# Patient Record
Sex: Male | Born: 1950 | Hispanic: Yes | Marital: Married | Smoking: Never smoker
Health system: Southern US, Community
[De-identification: ages and names within clinical notes are randomized; demographics above are authoritative.]

## PROBLEM LIST (undated history)

## (undated) DIAGNOSIS — R29898 Other symptoms and signs involving the musculoskeletal system: Secondary | ICD-10-CM

## (undated) DIAGNOSIS — Z8739 Personal history of other diseases of the musculoskeletal system and connective tissue: Secondary | ICD-10-CM

## (undated) DIAGNOSIS — Z8719 Personal history of other diseases of the digestive system: Secondary | ICD-10-CM

## (undated) DIAGNOSIS — R42 Dizziness and giddiness: Secondary | ICD-10-CM

## (undated) DIAGNOSIS — M2652 Limited mandibular range of motion: Secondary | ICD-10-CM

## (undated) DIAGNOSIS — Z860100 Personal history of colon polyps, unspecified: Secondary | ICD-10-CM

## (undated) DIAGNOSIS — E039 Hypothyroidism, unspecified: Secondary | ICD-10-CM

## (undated) DIAGNOSIS — F329 Major depressive disorder, single episode, unspecified: Secondary | ICD-10-CM

## (undated) DIAGNOSIS — R001 Bradycardia, unspecified: Secondary | ICD-10-CM

## (undated) DIAGNOSIS — M1990 Unspecified osteoarthritis, unspecified site: Secondary | ICD-10-CM

## (undated) DIAGNOSIS — Z955 Presence of coronary angioplasty implant and graft: Secondary | ICD-10-CM

## (undated) DIAGNOSIS — Z8589 Personal history of malignant neoplasm of other organs and systems: Secondary | ICD-10-CM

## (undated) DIAGNOSIS — Z9889 Other specified postprocedural states: Secondary | ICD-10-CM

## (undated) DIAGNOSIS — E119 Type 2 diabetes mellitus without complications: Secondary | ICD-10-CM

## (undated) DIAGNOSIS — N401 Enlarged prostate with lower urinary tract symptoms: Secondary | ICD-10-CM

## (undated) DIAGNOSIS — M2560 Stiffness of unspecified joint, not elsewhere classified: Secondary | ICD-10-CM

## (undated) DIAGNOSIS — E785 Hyperlipidemia, unspecified: Secondary | ICD-10-CM

## (undated) DIAGNOSIS — I10 Essential (primary) hypertension: Secondary | ICD-10-CM

## (undated) DIAGNOSIS — I2510 Atherosclerotic heart disease of native coronary artery without angina pectoris: Secondary | ICD-10-CM

## (undated) DIAGNOSIS — R112 Nausea with vomiting, unspecified: Secondary | ICD-10-CM

## (undated) DIAGNOSIS — K219 Gastro-esophageal reflux disease without esophagitis: Secondary | ICD-10-CM

## (undated) DIAGNOSIS — L409 Psoriasis, unspecified: Secondary | ICD-10-CM

## (undated) DIAGNOSIS — I219 Acute myocardial infarction, unspecified: Secondary | ICD-10-CM

## (undated) DIAGNOSIS — C801 Malignant (primary) neoplasm, unspecified: Secondary | ICD-10-CM

## (undated) DIAGNOSIS — K635 Polyp of colon: Secondary | ICD-10-CM

## (undated) DIAGNOSIS — Z923 Personal history of irradiation: Secondary | ICD-10-CM

## (undated) DIAGNOSIS — N400 Enlarged prostate without lower urinary tract symptoms: Secondary | ICD-10-CM

## (undated) DIAGNOSIS — K5790 Diverticulosis of intestine, part unspecified, without perforation or abscess without bleeding: Secondary | ICD-10-CM

## (undated) DIAGNOSIS — E782 Mixed hyperlipidemia: Secondary | ICD-10-CM

## (undated) DIAGNOSIS — N183 Chronic kidney disease, stage 3 unspecified: Secondary | ICD-10-CM

## (undated) DIAGNOSIS — F419 Anxiety disorder, unspecified: Secondary | ICD-10-CM

## (undated) DIAGNOSIS — Z794 Long term (current) use of insulin: Secondary | ICD-10-CM

## (undated) DIAGNOSIS — F32A Depression, unspecified: Secondary | ICD-10-CM

## (undated) DIAGNOSIS — M65311 Trigger thumb, right thumb: Secondary | ICD-10-CM

## (undated) HISTORY — DX: Essential (primary) hypertension: I10

## (undated) HISTORY — PX: CORONARY ANGIOPLASTY WITH STENT PLACEMENT: SHX49

## (undated) HISTORY — PX: ESOPHAGOGASTRODUODENOSCOPY: SHX1529

## (undated) HISTORY — DX: Other specified postprocedural states: Z98.890

## (undated) HISTORY — DX: Personal history of other diseases of the digestive system: Z87.19

## (undated) HISTORY — DX: Hyperlipidemia, unspecified: E78.5

## (undated) HISTORY — DX: Personal history of irradiation: Z92.3

## (undated) HISTORY — DX: Major depressive disorder, single episode, unspecified: F32.9

## (undated) HISTORY — PX: CORONARY ARTERY BYPASS GRAFT: SHX141

## (undated) HISTORY — PX: COLONOSCOPY WITH PROPOFOL: SHX5780

## (undated) HISTORY — DX: Malignant (primary) neoplasm, unspecified: C80.1

## (undated) HISTORY — PX: CORONARY ANGIOPLASTY: SHX604

## (undated) HISTORY — DX: Gastro-esophageal reflux disease without esophagitis: K21.9

## (undated) HISTORY — PX: CARDIAC CATHETERIZATION: SHX172

## (undated) HISTORY — DX: Depression, unspecified: F32.A

## (undated) HISTORY — DX: Diverticulosis of intestine, part unspecified, without perforation or abscess without bleeding: K57.90

## (undated) HISTORY — DX: Polyp of colon: K63.5

## (undated) HISTORY — DX: Anxiety disorder, unspecified: F41.9

---

## 2001-01-23 DIAGNOSIS — I252 Old myocardial infarction: Secondary | ICD-10-CM

## 2001-01-23 DIAGNOSIS — I219 Acute myocardial infarction, unspecified: Secondary | ICD-10-CM

## 2001-01-23 HISTORY — DX: Old myocardial infarction: I25.2

## 2001-01-23 HISTORY — DX: Acute myocardial infarction, unspecified: I21.9

## 2001-01-23 HISTORY — PX: CORONARY ANGIOPLASTY WITH STENT PLACEMENT: SHX49

## 2002-01-23 DIAGNOSIS — Z951 Presence of aortocoronary bypass graft: Secondary | ICD-10-CM

## 2002-01-23 HISTORY — DX: Presence of aortocoronary bypass graft: Z95.1

## 2003-01-24 HISTORY — PX: CORONARY ARTERY BYPASS GRAFT: SHX141

## 2009-05-27 ENCOUNTER — Ambulatory Visit: Payer: Self-pay | Admitting: Family Medicine

## 2009-07-09 ENCOUNTER — Encounter: Payer: Self-pay | Admitting: Internal Medicine

## 2009-07-23 HISTORY — PX: CARDIOVASCULAR STRESS TEST: SHX262

## 2009-07-28 ENCOUNTER — Ambulatory Visit: Payer: Self-pay | Admitting: Family Medicine

## 2009-08-02 ENCOUNTER — Ambulatory Visit: Payer: Self-pay | Admitting: Internal Medicine

## 2009-08-02 DIAGNOSIS — E782 Mixed hyperlipidemia: Secondary | ICD-10-CM

## 2009-08-02 DIAGNOSIS — I10 Essential (primary) hypertension: Secondary | ICD-10-CM | POA: Insufficient documentation

## 2009-08-02 DIAGNOSIS — E1039 Type 1 diabetes mellitus with other diabetic ophthalmic complication: Secondary | ICD-10-CM

## 2009-08-02 DIAGNOSIS — E1065 Type 1 diabetes mellitus with hyperglycemia: Secondary | ICD-10-CM

## 2009-08-02 DIAGNOSIS — I2510 Atherosclerotic heart disease of native coronary artery without angina pectoris: Secondary | ICD-10-CM | POA: Insufficient documentation

## 2009-08-11 ENCOUNTER — Telehealth (INDEPENDENT_AMBULATORY_CARE_PROVIDER_SITE_OTHER): Payer: Self-pay

## 2009-08-12 ENCOUNTER — Encounter (HOSPITAL_COMMUNITY): Admission: RE | Admit: 2009-08-12 | Discharge: 2009-10-14 | Payer: Self-pay | Admitting: Internal Medicine

## 2009-08-12 ENCOUNTER — Ambulatory Visit: Payer: Self-pay | Admitting: Cardiology

## 2009-08-12 ENCOUNTER — Ambulatory Visit: Payer: Self-pay

## 2009-08-12 ENCOUNTER — Encounter: Payer: Self-pay | Admitting: Internal Medicine

## 2009-08-12 ENCOUNTER — Encounter: Payer: Self-pay | Admitting: Cardiology

## 2009-08-12 ENCOUNTER — Ambulatory Visit (HOSPITAL_COMMUNITY): Admission: RE | Admit: 2009-08-12 | Discharge: 2009-08-12 | Payer: Self-pay | Admitting: Internal Medicine

## 2009-08-16 ENCOUNTER — Telehealth: Payer: Self-pay | Admitting: Internal Medicine

## 2009-08-16 ENCOUNTER — Encounter: Payer: Self-pay | Admitting: Cardiology

## 2009-08-19 ENCOUNTER — Encounter: Payer: Self-pay | Admitting: Internal Medicine

## 2009-08-27 ENCOUNTER — Ambulatory Visit: Payer: Self-pay | Admitting: Family Medicine

## 2009-09-03 ENCOUNTER — Telehealth (INDEPENDENT_AMBULATORY_CARE_PROVIDER_SITE_OTHER): Payer: Self-pay | Admitting: *Deleted

## 2009-09-15 ENCOUNTER — Encounter: Admission: RE | Admit: 2009-09-15 | Discharge: 2009-09-15 | Payer: Self-pay | Admitting: Podiatry

## 2009-09-17 ENCOUNTER — Ambulatory Visit: Payer: Self-pay | Admitting: Internal Medicine

## 2009-10-13 ENCOUNTER — Ambulatory Visit (HOSPITAL_BASED_OUTPATIENT_CLINIC_OR_DEPARTMENT_OTHER): Admission: RE | Admit: 2009-10-13 | Discharge: 2009-10-13 | Payer: Self-pay | Admitting: Podiatry

## 2009-10-13 HISTORY — PX: FOOT ARTHRODESIS, SUBTALAR: SUR53

## 2009-10-18 ENCOUNTER — Ambulatory Visit: Payer: Self-pay | Admitting: Family Medicine

## 2010-01-28 ENCOUNTER — Encounter: Payer: Self-pay | Admitting: Internal Medicine

## 2010-01-28 ENCOUNTER — Ambulatory Visit
Admission: RE | Admit: 2010-01-28 | Discharge: 2010-01-28 | Payer: Self-pay | Source: Home / Self Care | Attending: Internal Medicine | Admitting: Internal Medicine

## 2010-02-02 ENCOUNTER — Encounter: Payer: Self-pay | Admitting: Internal Medicine

## 2010-02-02 ENCOUNTER — Ambulatory Visit
Admission: RE | Admit: 2010-02-02 | Discharge: 2010-02-02 | Payer: Self-pay | Source: Home / Self Care | Attending: Family Medicine | Admitting: Family Medicine

## 2010-02-13 ENCOUNTER — Encounter: Payer: Self-pay | Admitting: Podiatry

## 2010-02-22 NOTE — Assessment & Plan Note (Signed)
Summary: np6/sur clearance/foot surgery/jss   Visit Type:  Pre-op Evaluation np6 Primary Provider:  dr Standley Dakins  CC:  sharp pain in left arm  and sweats.  History of Present Illness: Patient is a 60 year old who is being evaluated for foot surgery (requiring general anesthesia).  Referred for cardiac risk stratification. [place] in March of this year.  Few records.  Had PTCA/stent with stent to LAD in 2003.   Stent developed renarrowing in 2004 He also had CABG (LIMA to LAD; SVG to OM) in 2004.  Cath in  September 2006 showed LIMA to LAD was atrophic; SVG to Cx was occluded.  Repeat cath in October 2005 patieht had PTCA/DES  to OM, and PTCA/DES to LAD. He was admitted to hospital in [place] in May of 2010 for chest pain.  A myoview scan was reprotedly done that was normal.   Since seen he denies significant chest pians.  Breathing is OK.  He walks some (limited by R foot problems)  Surgery for foot is being planned. The patient denies csht pain.  Does say he has gotten some night sweats (like he had  before his intervention in 2003.  Current Medications (verified): 1)  Metformin Hcl 500 Mg Tabs (Metformin Hcl) .[name].. 1 Tab Once Daily 2)  Aspirin Ec 325 Mg Tbec (Aspirin) .... Take One Tablet By Mouth Daily 3)  Simvastatin 20 Mg Tabs (Simvastatin) .[name].. 1 Tab Once Daily 4)  Enalapril Maleate 10 Mg Tabs (Enalapril Maleate) .... Take One Tablet By Mouth Once Daily 5)  Ranitidine Hcl 300 Mg Caps (Ranitidine Hcl) .[name].. 1 Tab Two Times A Day 6)  Neurontin 600 Mg Tabs (Gabapentin) .[name].. 1 Tab Once Daily 7)  Dipyridamole 75 Mg Tabs (Dipyridamole) .[name].. 1 Tab Once Daily  Allergies (verified): No Known Drug Allergies  Past History:  Past Medical History: CAD Dyslipidemia Diabetes Reflux.  Past Surgical History: CABG 2005 (LIMA to LAD; SVG to OM)  Family History: Father died age 4 unknown cause. Mother died age 73. Sister died of CVA  Social  History: No tobacco No EtOH.  Married 2 kds.  Review of Systems       All systems reviewed.  Negatvie to the above problem except as noted above.  Vital Signs:  Patient profile:   60 year old male Height:      68 inches Weight:      192 pounds BMI:     29.30 Pulse rate:   64 / minute BP sitting:   138 / 85  (left arm) Cuff size:   regular  Vitals Entered By: Burnett Kanaris, CNA (August 02, 2009 11:59 AM)  Physical Exam  Additional Exam:  patient is in NAD HEENT:  Normocephalic, atraumatic. EOMI, PERRLA.  Neck: JVP is normal. No thyromegaly. No bruits.  Lungs: clear to auscultation. No rales no wheezes.  Heart: Regular rate and rhythm. Normal S1, S2. No S3.   No significant murmurs. PMI not displaced.  Abdomen:  Supple, nontender. Normal bowel sounds. No masses. No hepatomegaly.  Extremities:   Good distal pulses throughout. No lower extremity edema.  Musculoskeletal :moving all extremities.   R anke with bruising, swelling. Neuro:   alert and oriented x3.    EKG  Procedure date:  08/02/2009  Findings:      NSR.  64 bpm.  Possible IWMI.  Anterior MI.  Impression & Recommendations:  Problem # 1:  CAD (ICD-414.00) Patient with extensive history.  Concerning  to me is symptoms of night sweats like he had before his surgery.    I would recommend adenosine myoview   and and echo.  Continue meds.  He can stop dipyridamole.  Problem # 2:  HYPERLIPIDEMIA-MIXED (ICD-272.4) Lipids are frollowed by Dr. Laural Benes.  Reproted that recently increased to 40 Zocor.  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  Other Orders: EKG w/ Interpretation (93000) Echocardiogram (Echo) Nuclear Stress Test (Nuc Stress Test)  Patient Instructions: 1)  Your physician has requested that you have an echocardiogram.  Echocardiography is a painless test that uses sound waves to create images of your heart. It provides  your doctor with information about the size and shape of your heart and how well your heart's chambers and valves are working.  This procedure takes approximately one hour. There are no restrictions for this procedure. we will call you with results 2)  Your physician has requested that you have an adenosine myoview.  For further information please visit [email or web address]  Please follow instruction sheet, as given. 3)  we will call you with results 4)  Your physician has recommended you make the following change in your medication: STOP Persantine

## 2010-02-22 NOTE — Medication Information (Signed)
Summary: Non Pharmacy Prescription Form   Non Pharmacy Prescription Form   Imported By: Roderic Ovens 10/07/2009 13:06:00  _____________________________________________________________________  External Attachment:    Type:   Image     Comment:   External Document

## 2010-02-22 NOTE — Progress Notes (Signed)
Summary: test results  Phone Note Call from Patient Call back at Home Phone [phone]   Caller: Patient Summary of Call: test result Initial call taken by: Judie Grieve,  August 16, 2009 2:05 PM  Follow-up for Phone Call        Sent flag note to Dr.Zyair Rhein to call patient back with a plan. Follow-up by: Suzan Garibaldi RN  Additional Follow-up for Phone Call Additional follow up Details #1::        See append on nuc scan.  Low risk. Additional Follow-up by: Sherrill Raring, MD, [facility],  August 18, 2009 5:02 PM     Appended Document: test results Patient aware of echo and nuc stress test results.

## 2010-02-22 NOTE — Progress Notes (Signed)
Summary: Records faxed  Faxed surgical clearance and test results to [facility]-at [phone], [place].Rene Kocher Flowers  September 03, 2009 3:53 PM

## 2010-02-22 NOTE — Assessment & Plan Note (Signed)
Summary: started on Toprol 25mg  on 7/28/jr   Visit Type:  Follow-up Primary Bentley Haralson:  dr Standley Dakins  CC:  pt  continues tohave pain left arm .  History of Present Illness: Patient is a 60 year old who is being evaluated for foot surgery (requiring general anesthesia).  Referred for cardiac risk stratification. [place] in March of this year.  Few records.  Had PTCA/stent with stent to LAD in 2003.   Stent developed renarrowing in 2004 He also had CABG (LIMA to LAD; SVG to OM) in 2004.  Cath in  September 2006 showed LIMA to LAD was atrophic; SVG to Cx was occluded.  Repeat cath in October 2005 patieht had PTCA/DES  to OM, and PTCA/DES to LAD. He was admitted to hospital in [place] in May of 2010 for chest pain.  A myoview scan was reprotedly done that was normal.  I saw him earlier this summer.  He had an echo and a myoview.  Echo showed mild LV dysfunction with anterseptal and apical hypokinesis.  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 Since seen he denies chest pains.  He does have intermitt L arm pain that he has had for years.  Occurs at rest.  Lambert Mody, stabbing.  Occurs in L antecubital region.  No radiation.  Lasts about a min.  Takes breath away.  Note that he did nave this when he had his last noninvasive evaluation in [place] He notes that the toprol makes him sleepy.  wants to stop.  No dizziness.  Also wants to decrease ASA to 81 mg.   Current Medications (verified): 1)  Metformin Hcl 500 Mg Tabs (Metformin Hcl) .... 2 Tabs Bid 2)  Aspirin Ec 325 Mg Tbec (Aspirin) .... Take One Tablet By Mouth Daily 3)  Enalapril Maleate 10 Mg Tabs (Enalapril Maleate) .... Take One Tablet By Mouth Once Daily 4)  Neurontin 600 Mg Tabs (Gabapentin) .[name].. 1 Tab Once Daily 5)  Simvastatin 40 Mg Tabs (Simvastatin) .[name].. 1 Tab Every Day 6)  Metoprolol Succinate 25 Mg Xr24h-Tab (Metoprolol Succinate) .[name].. 1 Tab Every Day 7)   Prilosec 40 Mg Cpdr (Omeprazole) .[name].. 1tab Once Daily  Allergies (verified): No Known Drug Allergies  Past History:  Past medical, surgical, family and social histories (including risk factors) reviewed, and no changes noted (except as noted below).  Past Medical History: Reviewed history from 08/02/2009 and no changes required. CAD Dyslipidemia Diabetes Reflux.  Past Surgical History: Reviewed history from 08/02/2009 and no changes required. CABG 2005 (LIMA to LAD; SVG to OM)  Family History: Reviewed history from 08/02/2009 and no changes required. Father died age 61 unknown cause. Mother died age 38. Sister died of CVA  Social History: Reviewed history from 08/02/2009 and no changes required. No tobacco No EtOH.  Married 2 kds.  Vital Signs:  Patient profile:   60 year old male Height:      68 inches Weight:      188 pounds BMI:     28.69 Pulse rate:   57 / minute BP sitting:   128 / 78  (left arm)  Vitals Entered By: Burnett Kanaris, CNA (September 17, 2009 9:11 AM)  Physical Exam  Additional Exam:  Patient is in NAD HEENT:  Normocephalic, atraumatic. EOMI, PERRLA.  Neck: JVP is normal. No thyromegaly. No bruits.  Lungs: clear to auscultation. No rales no wheezes.  Heart: Regular rate and rhythm. Normal S1, S2. No S3.  No significant murmurs. PMI not displaced.  Abdomen:  Supple, nontender. Normal bowel sounds. No masses. No hepatomegaly.  Extremities:   Good distal pulses throughout. No lower extremity edema.  Musculoskeletal :moving all extremities.  Neuro:   alert and oriented x3.    EKG  Procedure date:  09/17/2009  Findings:      Sinus bradycardia.  57 bpm. Anteroseptal MI.  Impression & Recommendations:  Problem # 1:  CAD (ICD-414.00) Patient with CAD as noted.  He is not having CP.  Has atypical arm pain that I do not think is cardiac.   Recent noninvasive testing shows no ischemia.  It does show evidence of scar that was not reported by him  on the previous stress test in [place] ( I do not have these records).  This is in the distribution of the LAD which has had mult interventions as noted.  With no ischemia I do not think repeat cath is indicated without symptoms.  He is not that active because of his foot but does get around with pain, significant SOB.  Anyway, repeat intervention in this area would had increased chance of not succeeding. Overall, I think is is at low risk for major cardiac event with planned surgery.  OK to proceed. OK to decrease ASA to 81 mg.  OK to D/C toprol.  Problem # 2:  HYPERTENSION, BENIGN (ICD-401.1) Follow.  Problem # 3:  HYPERLIPIDEMIA-MIXED (ICD-272.4) Continue meds. His updated medication list for this problem includes:    Simvastatin 40 Mg Tabs (Simvastatin) .[name]... 1 tab every day  Other Orders: EKG w/ Interpretation (93000)  Patient Instructions: 1)  Your physician wants you to follow-up in:  will let you know when to see Dr.Ross again.  You will receive a reminder letter in the mail two months in advance. If you don't receive a letter, please call our office to schedule the follow-up appointment.

## 2010-02-22 NOTE — Progress Notes (Signed)
Summary: Nuc. Pre-Procedure  Phone Note Outgoing Call Call back at [facility] Phone [phone]   Call placed by: Irean Hong, RN,  August 11, 2009 2:42 PM Summary of Call: Reviewed information on Myoview Information Sheet (see scanned document for further details).  Spoke with patient.     Nuclear Med Background Indications for Stress Test: Evaluation for Ischemia, Surgical Clearance, Graft Patency, Stent Patency, PTCA Patency  Indications Comments: Pending (R) Foot surgery  History: Abnormal EKG, Angioplasty, CABG, Heart Catheterization, Myocardial Perfusion Study, Stents  History Comments: '03 Stent LAD. '04 CABGx2 '06 Cath> PTCA/Stent: LAD,OM. 5/10 MPS: NL([place]).     Nuclear Pre-Procedure Cardiac Risk Factors: Lipids, NIDDM Height (in): 68

## 2010-02-22 NOTE — Miscellaneous (Signed)
  Clinical Lists Changes  Medications: Added new medication of METOPROLOL SUCCINATE 25 MG XR24H-TAB (METOPROLOL SUCCINATE) 1 tab every day - Signed Rx of METOPROLOL SUCCINATE 25 MG XR24H-TAB (METOPROLOL SUCCINATE) 1 tab every day;  #30 x 6;  Signed;  Entered by: Layne Benton, RN, BSN;  Authorized by: Sherrill Raring, MD, [facility];  Method used: Electronically to [facility]. [place]. [phone]*, [address] [place], [place], [place]  [address], Ph: [phone] or [phone], Fax: [phone]    Prescriptions: METOPROLOL SUCCINATE 25 MG XR24H-TAB (METOPROLOL SUCCINATE) 1 tab every day  #30 x 6   Entered by:   Layne Benton, RN, BSN   Authorized by:   Sherrill Raring, MD, [facility]   Signed by:   Layne Benton, RN, BSN on 08/19/2009   Method used:   Electronically to        [facility]. [address] [phone]* (retail)       [address]       [place], [place]  [address]       Ph: [phone] or [phone]       Fax: [phone]   RxID:   [id number]

## 2010-02-22 NOTE — Letter (Signed)
Summary: [facility] - Referral  [facility] - Referral   Imported By: Marylou Mccoy 07/27/2009 15:46:57  _____________________________________________________________________  External Attachment:    Type:   Image     Comment:   External Document

## 2010-02-22 NOTE — Assessment & Plan Note (Signed)
Summary: Cardiology Nuclear Study  Nuclear Med Background Indications for Stress Test: Evaluation for Ischemia, Surgical Clearance, Graft Patency, Stent Patency, PTCA Patency  Indications Comments: Pending (R) [facility]  History: Abnormal EKG, Angioplasty, CABG, Heart Catheterization, Myocardial Perfusion Study, Stents  History Comments: '03 Stent-LAD; '04 CABG x 2; '06 PTCA/Stent-LAD,OM; 5/10 [id number]:[id number]  Symptoms: Chest Pressure, Chest Pressure with Exertion, Diaphoresis, Dizziness, DOE, Fatigue, Nausea, Near Syncope  Symptoms Comments: Last episode of BJ:YNWGNFAO 2010.   Nuclear Pre-Procedure Cardiac Risk Factors: Hypertension, Lipids, NIDDM Caffeine/Decaff Intake: None NPO After: 10:00 PM Lungs: Clear.  O2 Sat 96% on RA. IV 0.9% NS with Angio Cath: 20g     IV Site: (R) AC IV Started by: Irean Hong RN Chest Size (in) 40     Height (in): 68 Weight (lb): 189 BMI: 28.84 Tech Comments: No medications taken today per patient.  Nuclear Med Study 1 or 2 day study:  1 day     Stress Test Type:  Eugenie Birks Reading MD:  Marca Ancona, MD     Referring MD:  Dietrich Pates, MD Resting Radionuclide:  Technetium 37m Tetrofosmin     Resting Radionuclide Dose:  11.0 mCi  Stress Radionuclide:  Technetium 52m Tetrofosmin     Stress Radionuclide Dose:  33.0 mCi   Stress Protocol   Lexiscan: 0.4 mg   Stress Test Technologist:  Rea College CMA-N     Nuclear Technologist:  Harlow Asa CNMT  Rest Procedure  Myocardial perfusion imaging was performed at rest 45 minutes following the intravenous administration of Myoview Technetium 33m Tetrofosmin.  Stress Procedure  The patient received IV Lexiscan 0.4 mg over 15-seconds.  Myoview injected at 30-seconds.  There were no significant changes with infusion.  He did c/o chest pressure.  Quantitative spect images were obtained after a 45 minute delay.  QPS Raw Data Images:  Normal; no motion artifact; normal heart/lung  ratio. Stress Images:  Basal to mid anteroseptal perfusion defect.  Rest Images:  Basal to mid anteroseptal perfusion defect.  Subtraction (SDS):  Primarily fixed basal to mid anteroseptal perfusion defect with mild reversibility.  Transient Ischemic Dilatation:  .94  (Normal <1.22)  Lung/Heart Ratio:  .29  (Normal <0.45)  Quantitative Gated Spect Images QGS EDV:  93 ml QGS ESV:  50 ml QGS EF:  46 % QGS cine images:  Anteroseptal and apical hypokinesis.    Overall Impression  Exercise Capacity: Lexiscan study BP Response: Normal blood pressure response. Clinical Symptoms: nausea, chest pressure.  ECG Impression: No significant ST segment change suggestive of ischemia. Overall Impression: Primarily fixed basal to mid anteroseptal perfusion defect suggestive of infarction with some peri-infarct ischemia.  Overall Impression Comments: Anteroseptal wall motion defect with mild LV systolic dysfunction.   Appended Document: Cardiology Nuclear Study Myoview is not normal.  Evidence of previuos heart attack with mild perinfarct ischemia. Attempted to call patient to review.  Not at home.  Appended Document: Cardiology Nuclear Study Reviewed nuclear scan.  Overall, low risk scan with previous infarct. If patient is not having chest pain active  he is a low risk to proceed with upcoming foot surgery With history and mildly depressed LVEF I would recomm low dose b blocker in addition to his other meds. (Toprol XL 25 mg). He will need f/u in late summer. Left msg on machine to call back or we would call back.  Appended Document: Cardiology Nuclear Study Patient aware of above. Toprol sent to [facility]. Made ROV for 8/26 with Dr.Ross.

## 2010-02-24 NOTE — Assessment & Plan Note (Signed)
Summary: 6 MONTH ROV/SL   Visit Type:  6 mo f/u Primary Provider:  Standley Dakins  CC:  chest pain last night, sob, and pt also states yesterday while he was at physical therpay for his foot he had some cp and was diaphortic while he was on the exercise bike...edema/left foot.....  History of Present Illness: Patient is a 60 year old who is being evaluated for foot surgery (requiring general anesthesia).  Referred for cardiac risk stratification. [place] in March of this year.  Few records.  Had PTCA/stent with stent to LAD in 2003.   Stent developed renarrowing in 2004 He also had CABG (LIMA to LAD; SVG to OM) in 2004.  Cath in  September 2006 showed LIMA to LAD was atrophic; SVG to Cx was occluded.  Repeat cath in October 2005 patieht had PTCA/DES  to OM, and PTCA/DES to LAD. He was admitted to hospital in [place] in May of 2010 for chest pain.  A myoview scan was reprotedly done that was normal.  I saw him earlier this summer.  He had an echo and a myoview.  Echo showed mild LV dysfunction with anterseptal and apical hypokinesis.  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 Since I saw him in August  he underwent foot surgery and toloerated it well.  He has now started rehab. The other day he was on the bike at rehab.  When he got off he developed chest tightness.  He had not had that in a long time.  It eased off on own.  Concerned him.   The other night while watching TV also developed L sided chest pain.  Again concerning, like previous angina.  Eased off on own.  Has not had any since.  Current Medications (verified): 1)  Metformin Hcl 500 Mg Tabs (Metformin Hcl) .[name].. 1 Tab Two Times A Day 2)  Aspirin Ec 325 Mg Tbec (Aspirin) .... Take One Tablet By Mouth Daily 3)  Enalapril Maleate 10 Mg Tabs (Enalapril Maleate) .... Take One Tablet By Mouth Once Daily 4)  Neurontin 600 Mg Tabs (Gabapentin) .[name].. 1 Tab Once Daily 5)   Simvastatin 40 Mg Tabs (Simvastatin) .[name].. 1 Tab Every Day 6)  Metoprolol Succinate 25 Mg Xr24h-Tab (Metoprolol Succinate) .[name].. 1 Tab Every Day 7)  Prilosec 40 Mg Cpdr (Omeprazole) .[name].. 1tab Once Daily  Allergies (verified): No Known Drug Allergies  Past History:  Past medical, surgical, family and social histories (including risk factors) reviewed, and no changes noted (except as noted below).  Past Medical History: Reviewed history from 08/02/2009 and no changes required. CAD Dyslipidemia Diabetes Reflux.  Past Surgical History: Reviewed history from 08/02/2009 and no changes required. CABG 2005 (LIMA to LAD; SVG to OM)  Family History: Reviewed history from 08/02/2009 and no changes required. Father died age 32 unknown cause. Mother died age 30. Sister died of CVA  Social History: Reviewed history from 08/02/2009 and no changes required. No tobacco No EtOH.  Married 2 kds.  Review of Systems       All systems reviewed.  Neg to the above problme except as noted above.  Vital Signs:  Patient profile:   60 year old male Height:      68 inches Weight:      193.75 pounds BMI:     29.57 Pulse rate:   54 / minute Pulse rhythm:   irregular BP sitting:   126 / 80  (left arm)  Cuff size:   large  Vitals Entered By: Danielle Rankin, CMA (January 28, 2010 2:32 PM)  Physical Exam  Additional Exam:  Patient is in NAD HEENT:  Normocephalic, atraumatic. EOMI, PERRLA.  Neck: JVP is normal. No thyromegaly. No bruits.  Lungs: clear to auscultation. No rales no wheezes.  Heart: Regular rate and rhythm. Normal S1, S2. No S3.   No significant murmurs. PMI not displaced.  Abdomen:  Supple, nontender. Normal bowel sounds. No masses. No hepatomegaly.  Extremities:   Good distal pulses throughout. No lower extremity edema.  Musculoskeletal :moving all extremities.  Neuro:   alert and oriented x3.    Impression & Recommendations:  Problem # 1:  CAD (ICD-414.00) Concering spells.   His myoview was not normal last summer but without signif ischemia. I have given him a new Rx for NTG.  I have instructed him that if he has more spells I would recommmend a left heart cath to define anatomy.  Problem # 2:  HYPERLIPIDEMIA-MIXED (ICD-272.4) Continue Rx. His updated medication list for this problem includes:    Simvastatin 40 Mg Tabs (Simvastatin) .[name]... 1 tab every day  Problem # 3:  HYPERTENSION, BENIGN (ICD-401.1) Continue meds. His updated medication list for this problem includes:    Aspirin Ec 325 Mg Tbec (Aspirin) .[name]... Take one tablet by mouth daily    Enalapril Maleate 10 Mg Tabs (Enalapril maleate) .[name]... Take one tablet by mouth once daily    Metoprolol Succinate 25 Mg Xr24h-tab (Metoprolol succinate) .[name]... 1 tab every day  Other Orders: EKG w/ Interpretation (93000)  Patient Instructions: 1)  Your physician wants you to follow-up in: 6 months  You will receive a reminder letter in the mail two months in advance. If you don't receive a letter, please call our office to schedule the follow-up appointment.  Appended Document: 6 MONTH ROV/SL LDL is 95  Should be lower.  I would try Crestor 20 mg.  F?U lipids and AST in 8 wks.[place]ilda [place] are high with diabetes.

## 2010-04-07 LAB — BASIC METABOLIC PANEL
Calcium: 9.8 mg/dL (ref 8.4–10.5)
Chloride: 104 mEq/L (ref 96–112)
Creatinine, Ser: 1.16 mg/dL (ref 0.4–1.5)
Sodium: 140 mEq/L (ref 135–145)

## 2010-04-07 LAB — BASIC METABOLIC PANEL WITH GFR
BUN: 11 mg/dL (ref 6–23)
CO2: 31 meq/L (ref 19–32)
GFR calc Af Amer: >60 mL/min (ref >60)
GFR calc non Af Amer: >60 mL/min (ref >60)
Glucose, Bld: 172 mg/dL — ABNORMAL HIGH (ref 70–99)
Potassium: 4.9 meq/L (ref 3.5–5.1)

## 2010-04-07 LAB — POCT HEMOGLOBIN-HEMACUE: Hemoglobin: 14.7 g/dL (ref 13.0–17.0)

## 2010-04-07 LAB — GLUCOSE, CAPILLARY
Glucose-Capillary: 120 mg/dL — ABNORMAL HIGH (ref 70–99)
Glucose-Capillary: 154 mg/dL — ABNORMAL HIGH (ref 70–99)

## 2010-05-04 ENCOUNTER — Encounter: Payer: Self-pay | Admitting: Family Medicine

## 2010-05-16 ENCOUNTER — Encounter (INDEPENDENT_AMBULATORY_CARE_PROVIDER_SITE_OTHER): Payer: Medicare PPO | Admitting: Family Medicine

## 2010-05-16 DIAGNOSIS — E782 Mixed hyperlipidemia: Secondary | ICD-10-CM

## 2010-05-16 DIAGNOSIS — I10 Essential (primary) hypertension: Secondary | ICD-10-CM

## 2010-05-16 DIAGNOSIS — Z79899 Other long term (current) drug therapy: Secondary | ICD-10-CM

## 2010-05-25 ENCOUNTER — Emergency Department (HOSPITAL_COMMUNITY): Payer: Medicare HMO

## 2010-05-25 ENCOUNTER — Inpatient Hospital Stay (HOSPITAL_COMMUNITY)
Admission: EM | Admit: 2010-05-25 | Discharge: 2010-05-26 | DRG: 287 | Disposition: A | Discharge status: Home / Self Care | Payer: Medicare HMO | Attending: Cardiovascular Disease | Admitting: Cardiovascular Disease

## 2010-05-25 DIAGNOSIS — R55 Syncope and collapse: Secondary | ICD-10-CM

## 2010-05-25 DIAGNOSIS — E119 Type 2 diabetes mellitus without complications: Secondary | ICD-10-CM | POA: Diagnosis present

## 2010-05-25 DIAGNOSIS — Z9861 Coronary angioplasty status: Secondary | ICD-10-CM

## 2010-05-25 DIAGNOSIS — I10 Essential (primary) hypertension: Secondary | ICD-10-CM | POA: Diagnosis present

## 2010-05-25 DIAGNOSIS — I2510 Atherosclerotic heart disease of native coronary artery without angina pectoris: Secondary | ICD-10-CM

## 2010-05-25 DIAGNOSIS — R079 Chest pain, unspecified: Secondary | ICD-10-CM

## 2010-05-25 HISTORY — PX: CARDIAC CATHETERIZATION: SHX172

## 2010-05-25 LAB — COMPREHENSIVE METABOLIC PANEL
Creatinine, Ser: 1.01 mg/dL (ref 0.4–1.5)
GFR calc Af Amer: >60 mL/min (ref >60)
GFR calc non Af Amer: >60 mL/min (ref >60)
Sodium: 140 mEq/L (ref 135–145)
Total Bilirubin: 0.4 mg/dL (ref 0.3–1.2)
Total Protein: 6.5 g/dL (ref 6.0–8.3)

## 2010-05-25 LAB — PROTIME-INR: Prothrombin Time: 13.7 seconds (ref 11.6–15.2)

## 2010-05-25 LAB — CBC
Hemoglobin: 12.4 g/dL — ABNORMAL LOW (ref 13.0–17.0)
MCHC: 34.1 g/dL (ref 30.0–36.0)
MCV: 80.9 fL (ref 78.0–100.0)
WBC: 5.7 10*3/uL (ref 4.0–10.5)

## 2010-05-25 LAB — URINALYSIS, ROUTINE W REFLEX MICROSCOPIC
Bilirubin Urine: NEGATIVE
Glucose, UA: NEGATIVE mg/dL
Ketones, ur: NEGATIVE mg/dL
Leukocytes, UA: NEGATIVE
Nitrite: NEGATIVE
pH: 5.5 (ref 5.0–8.0)

## 2010-05-25 LAB — URINE MICROSCOPIC-ADD ON

## 2010-05-25 LAB — POCT CARDIAC MARKERS: Myoglobin, poc: 65.7 ng/mL (ref 12–200)

## 2010-05-25 LAB — MAGNESIUM: Magnesium: 1.6 mg/dL (ref 1.5–2.5)

## 2010-05-25 LAB — TSH: TSH: 0.91 u[IU]/mL (ref 0.350–4.500)

## 2010-05-25 LAB — GLUCOSE, CAPILLARY: Glucose-Capillary: 104 mg/dL — ABNORMAL HIGH (ref 70–99)

## 2010-05-26 LAB — CBC
HCT: 39.7 % (ref 39.0–52.0)
MCH: 27.6 pg (ref 26.0–34.0)
MCV: 81.2 fL (ref 78.0–100.0)
Platelets: 148 10*3/uL — ABNORMAL LOW (ref 150–400)
RDW: 12.6 % (ref 11.5–15.5)
WBC: 7.2 10*3/uL (ref 4.0–10.5)

## 2010-05-26 LAB — BASIC METABOLIC PANEL
BUN: 10 mg/dL (ref 6–23)
Creatinine, Ser: 1.08 mg/dL (ref 0.4–1.5)
GFR calc non Af Amer: >60 mL/min (ref >60)
Glucose, Bld: 106 mg/dL — ABNORMAL HIGH (ref 70–99)
Potassium: 3.9 mEq/L (ref 3.5–5.1)

## 2010-05-26 LAB — HEMOGLOBIN A1C
Hgb A1c MFr Bld: 6.9 % — ABNORMAL HIGH (ref <5.7)
Mean Plasma Glucose: 151 mg/dL — ABNORMAL HIGH (ref <117)

## 2010-05-26 LAB — GLUCOSE, CAPILLARY: Glucose-Capillary: 119 mg/dL — ABNORMAL HIGH (ref 70–99)

## 2010-05-26 NOTE — Cardiovascular Report (Signed)
NAME:  Gregory, Friedman NO.:  [id number]  MEDICAL RECORD NO.:  [id number]           PATIENT TYPE:  O  LOCATION:  3730                         FACILITY:  [facility]  PHYSICIAN:  Verne Carrow, MDDATE OF BIRTH:  [birth year]  DATE OF PROCEDURE:  05/25/2010 DATE OF DISCHARGE:                           CARDIAC CATHETERIZATION   PRIMARY CARDIOLOGIST:  Pricilla Riffle, MD, [facility]  PROCEDURES PERFORMED: 1. Left heart catheterization. 2. Selective coronary angiography. 3. Left ventricular angiogram. 4. Left internal mammary artery graft angiogram.  OPERATOR:  Verne Carrow, MD  INDICATIONS:  This is a 60 year old [place] male who has an extensive cardiac history with prior stenting procedures of the left anterior descending artery and obtuse marginal branch of the circumflex artery in [place] approximately 6 years ago.  The patient had undergone a two-vessel bypass in 2004; however, both bypass grafts failed shortly thereafter.  He presented to the emergency department today with complaints of chest pain.  His cardiac enzymes have been negative thus far.  Because of his history, he was put on a scheduled for cardiac catheterization today per Dr. Roger Shelter.  DETAILS OF PROCEDURE:  The patient was brought to the main cardiac catheterization laboratory after signing informed consent for the procedure.  The right groin was prepped and draped in sterile fashion. Lidocaine 1% was used for local anesthesia.  A 5-French sheath was inserted into the right femoral artery without difficulty.  Standard catheters were initially used.  I was unable to engage the left main artery with the Judkins left catheters.  An Amplatz left one catheter was used to selectively engage the left main artery.  A JR-4 catheter was used to selectively engage and inject the native right coronary artery.  A pigtail catheter was used to perform a left ventricular angiogram.  The  patient tolerated the diagnostic portion of the procedure well.  He was taken to the recovery area in stable condition.  HEMODYNAMIC FINDINGS:  Central aortic pressure 109/64.  Left ventricular pressure 94/6/10.  ANGIOGRAPHIC FINDINGS: 1. The distal left main artery had a 20% stenosis.  This did not     appear to be flow-limiting. 2. The left anterior descending was a large vessel that coursed to the     apex.  There was a stent in the mid vessel that is patent with no     restenosis.  There were 30% lesions in the proximal mid and distal     portion of the vessel.  None of these lesions appear to be flow-     limiting.  There was a moderate-sized diagonal branch that appeared     to have a 60% stenosis in the midportion of this vessel.  In     several views, this did not look as severe as 60%.  I do not think     this is a flow-limiting lesion. 3. The circumflex artery gives off an early small-caliber obtuse     marginal branch that has diffuse 50% stenosis.  The second obtuse     marginal is a large-caliber vessel with a stent that is patent.     The AV  groove circumflex has plaque disease.  Ultimately, there is     a third obtuse marginal branch that consists of three very small     branches.  There appears to be a 70% stenosis right at the     trifurcation of these branches.  Just beyond this, the distal     circumflex is occluded but fills from left-to-left collaterals.     This appears chronic. 4. The right coronary artery is a large dominant vessel with a     discrete 50% mid stenosis and diffuse 40% plaque throughout the     posterolateral branch and the posterior descending artery.  None of     these lesions appear to be flow-limiting. 5. The left internal mammary artery graft to the LAD is atretic with     very little flow. 6. The saphenous vein graft to the second obtuse marginal is known to     be occluded and was not injected. 7. Left ventricular angiogram was performed  in the RAO projection that     showed normal left ventricular systolic function with ejection     fraction of 55%.  IMPRESSION: 1. Triple-vessel coronary artery disease. 2. Patent stents in the mid left anterior descending artery and in the     proximal portion of the second obtuse marginal branch of the     circumflex artery. 3. Moderate nonobstructive disease in the diagonal branch, the     circumflex artery and its distal portion, and the right coronary     artery in the midportion. 4. Normal left ventricular systolic function.  RECOMMENDATIONS:  This patient has multiple areas, which could be causing chest pain and could be causing ischemia.  This could be related to the severe disease in the distal portion of the circumflex artery that involved very small-caliber branches.  This also could be secondary to the moderate stenosis in the diagonal branch.  At this time, I would recommend medical management.  If we are unable to control his chest pain with medical management, then I would recommend a nuclear stress test to try to define any areas of ischemia that could be addressed possibly with percutaneous intervention.    Verne Carrow, MD     CM/MEDQ  D:  05/25/2010  T:  05/26/2010  Job:  [id number]  cc:   Pricilla Riffle, MD, [facility]  Electronically Signed by Verne Carrow MD on 05/26/2010 12:23:59 PM

## 2010-05-26 NOTE — Discharge Summary (Signed)
NAME:  Gregory Friedman, Gregory Friedman NO.:  [id number]  MEDICAL RECORD NO.:  [id number]           PATIENT TYPE:  O  LOCATION:  3730                         FACILITY:  [facility]  PHYSICIAN:  Verne Carrow, MDDATE OF BIRTH:  1950-06-24  DATE OF ADMISSION:  05/25/2010 DATE OF DISCHARGE:  05/26/2010                              DISCHARGE SUMMARY   PRIMARY CARDIOLOGIST:  Pricilla Riffle, MD, [facility]  PRIMARY CARE PROVIDER:  Lavonda Jumbo, MD  DISCHARGE DIAGNOSES: 1. Triple-vessel coronary artery disease status post cardiac     catheterization this admission demonstrating patent stents with     both bypass grafts occluded.     a.     Status post coronary artery bypass grafting in 2004 with      left internal mammary artery to left anterior descending,      saphenous vein graft to obtuse marginal.     b.     Status post cardiac catheterization in September 2006      showing left internal mammary artery to left anterior descending      atrophic and supraventricular tachycardia to obtuse marginal      occluded.     c.     Status post cardiac catheterization in 2005 or 2006 with      successful percutaneous coronary intervention with a drug-eluting      stent to the obtuse marginal and left anterior descending.     d.     Preserved left ventricular systolic function, ejection      fraction 55%.  SECONDARY DIAGNOSES: 1. Non-insulin-dependent diabetes mellitus. 2. Hypertension. 3. Hyperlipidemia. 4. Diverticulitis.  ALLERGIES:  No known drug allergies.  PROCEDURE/DIAGNOSTICS:  Performed during hospitalization: 1. Left heart catheterization with selective coronary angiography,     left ventricular angiogram, and left internal mammary artery graft     angiogram.     a.     Triple vessel coronary artery disease.     b.     Patent stents in the mid left anterior descending artery and      in the proximal portion of the second obtuse marginal branch of      the circumflex artery.    c.     Moderate nonobstructive disease in the diagonal branch, left      circumflex artery in its distal portion, as well as the right      artery in the midportion. 2. Preserved left ventricular systolic function, ejection fraction     55%.  REASON FOR HOSPITALIZATION:  This is a 60 year old [place] gentleman with an extensive cardiac history who was in his usual state of health until morning of admission when the patient developed chest pain associated with dyspnea, nausea, diaphoresis, and shortness of breath.  His wife called EMS.  He was given aspirin 81 mg x4 and sublingual nitroglycerin x3, the nitroglycerin did relieve his pain.  After administering nitroglycerin, the patient did become hypotensive and received IV fluids but had no further complaints of chest pain.  With the patient's prolonged chest pain relieved with nitroglycerin, it was felt that cardiac catheterization was needed to define his anatomy.  Risks, benefits,  and indications were discussed with the patient, and he had agreed to proceed.  HOSPITAL COURSE:  The patient was taken to the cardiac cath lab by Dr. Clifton James on May 25, 2010.  As above, the patient's stents were patent. Both bypass grafts were occluded.  He does have a chronically occluded distal circumflex and moderate disease in the diagonal.  It was felt that the patient has multiple areas that could be causing chest pain or ischemia and it was felt that we could recommend medical management.  If medical management is unable to control his chest pain, then it is recommended a nuclear stress test be ordered to define any areas of ischemia that could be addressed possibly with percutaneous coronary intervention.  The patient tolerated the procedure well and was taken for overnight observation.  On day of discharge, Dr. Clifton James evaluated the patient and noted him stable for home.  He had no further complaints of chest pain or shortness of breath.  His  right groin site was okay without signs of hematoma.  He will be continued on his home medication as well as an addition of the Imdur 30 mg nightly.  The patient is able to ambulate without difficulty or chest pain.  The patient will be discharged and have close outpatient followup.  DISCHARGE LABORATORY DATA:  Sodium 139, potassium 3.9, BUN 10, creatinine 1.08.  WBC 7.2, hemoglobin 13.5, hematocrit 39.7, platelets 148.  Hemoglobin A1c 6.9.  Point-of-care markers negative x1.  DISCHARGE MEDICATIONS: 1. Imdur 30 mg one tablet daily at bedtime. 2. Aspirin enteric coated 81 mg one tablet daily. 3. Enalapril 10 mg one tablet daily. 4. Kombiglyze XR 05/998 mg one tablet daily, the patient will restart     this medication on May 28, 2010. 5. Metoprolol succinate XL 25 mg one tablet daily. 6. Neurontin 600 mg one tablet daily. 7. Nitroglycerin sublingual 0.4 mg one tablet under tongue every 5     minutes up to three doses as needed for chest pain. 8. Prilosec 40 mg one capsule daily. 9. Red wine capsule over the counter 1 tablet daily. 10.Simvastatin 40 mg one tablet daily.  FOLLOWUP PLANS AND INSTRUCTIONS: 1. The patient will follow up with Dr. Tenny Craw on Jun 10, 2010, at 12     p.m. 2. The patient is to increase activity slowly.  He may shower but no     bathing.  There is no lifting for 1 week greater than 5 pounds.  No     driving for 2 days.  No sexual activity for 1 week.  The patient is     to keep his cath site clean and dry and call the office for any     problems. 3. The patient is to continue low-sodium heart-healthy diet. 4. The patient should stop any activities that cause chest pain or     shortness of breath. 5. The patient is to call the office in the interim for any problems     or concerns.  DURATION OF DISCHARGE:  Greater than 30 minutes.     Leonette Monarch, PA-C   ______________________________ Verne Carrow, MD    NB/MEDQ  D:  05/26/2010  T:   05/26/2010  Job:  [id number]  cc:   Lavonda Jumbo, M.D. Pricilla Riffle, MD, [facility]  Electronically Signed by Alen Blew P.A. on 05/26/2010 03:06:56 PM Electronically Signed by Verne Carrow MD on 05/26/2010 81:19:14 PM

## 2010-05-30 ENCOUNTER — Telehealth: Payer: Self-pay | Admitting: Internal Medicine

## 2010-05-30 ENCOUNTER — Ambulatory Visit (INDEPENDENT_AMBULATORY_CARE_PROVIDER_SITE_OTHER): Payer: Medicare HMO | Admitting: Medical

## 2010-05-30 DIAGNOSIS — R079 Chest pain, unspecified: Secondary | ICD-10-CM

## 2010-05-30 DIAGNOSIS — I10 Essential (primary) hypertension: Secondary | ICD-10-CM

## 2010-05-30 DIAGNOSIS — R197 Diarrhea, unspecified: Secondary | ICD-10-CM

## 2010-05-30 DIAGNOSIS — R42 Dizziness and giddiness: Secondary | ICD-10-CM

## 2010-05-30 LAB — GLUCOSE, CAPILLARY

## 2010-05-30 NOTE — Telephone Encounter (Signed)
LMOM for call back. 

## 2010-05-30 NOTE — Telephone Encounter (Signed)
Pt wife calling to discuss pt with nurse. Wife states that he was given a lot of medications prior to d/c from [facility]. Pt thinks that these medications are making him dizzy and weak. Wants to talk about them.

## 2010-05-31 NOTE — Telephone Encounter (Signed)
Called patient's wife. She states that when he was sent home from the hospital he had his discharge sheet and another patient's. She felt that he was given other medications that were not for him because of this error. She states that she has reported this possible error to a quality control rep from the hospital. He was feeling dizzy and weak yesterday and saw Dr.Knapp. She gave him prn Meclizine to take and now he is feeling better. He has an appointment with Dr.Ross in a few weeks.

## 2010-06-06 ENCOUNTER — Encounter: Payer: Self-pay | Admitting: Internal Medicine

## 2010-06-08 NOTE — H&P (Signed)
NAME:  Gregory, Friedman NO.:  [id number]  MEDICAL RECORD NO.:  [id number]           PATIENT TYPE:  O  LOCATION:  3730                         FACILITY:  [facility]  PHYSICIAN:  Colleen Can. Deborah Chalk, M.D.DATE OF BIRTH:  September 21, 1950  DATE OF ADMISSION:  05/25/2010 DATE OF DISCHARGE:                             HISTORY & PHYSICAL   PRIMARY CARE PHYSICIAN:  Dr. Lynelle Doctor.  PRIMARY CARDIOLOGIST:  Pricilla Riffle, MD, [facility]  CHIEF COMPLAINT:  Chest pain and presyncope.  HISTORY OF PRESENT ILLNESS:  Gregory Friedman is a 60 year old male with a history of coronary artery disease.  He had his medication changed on May 16, 2010 and since then, has had watery diarrhea approximately five times a day.  He has been otherwise in his usual state of health.  Today at 8:30, he felt okay upon waking, but when he sat up and tried to stand and then walk, he had severe dizziness.  The dizziness worsened to the point where he sat down because he was afraid he might fall.  The dizziness continued to be bad and he developed chest pain, nausea, diaphoresis, and shortness of breath.  He crawled back to the bedroom and was able to get back up on the bed.  He told his wife about his symptoms and EMS was called.  They gave him aspirin 81 mg x4 and O2.  He had an IV started.  His symptoms improved with those therapies and he received sublingual nitroglycerin x3 as well.  The sublingual nitroglycerin relieved the chest pain, but he became hypotensive and received some IV fluids.  After 500 mL of IV fluids in the emergency room, he is resting comfortably.  He still reports mild dizziness which is sometimes worse with movement and he also had a brief neck stiffness that resolved spontaneously.  PAST MEDICAL HISTORY: 1. Status post aortocoronary bypass surgery in 2004 with LIMA to LAD     and SVG to OM. 2. Status post cardiac catheterization in September 2006 showing LIMA     to LAD atrophic, SVG to  circumflex occluded. 3. In October 2005 or 2006, he had drug-eluting stents to the OM and     LAD. 4. Admission to the hospital in [place] in May 2010 for chest pain     when a Myoview scan reportedly was normal. 5. Status post Myoview in [place] in July 2011 showing an infarct     in the anteroseptal and apical areas with mild peri-infarct     ischemia and an EF of 46%. 6. Status post echocardiogram in July 2011 showing mild LVH, EF 45-50%     with apical hypokinesis and grade 1 diastolic dysfunction. 7. Diabetes. 8. Hypertension. 9. Hyperlipidemia. 10.Remote history of diverticulitis.  SURGICAL HISTORY:  Status post cardiac catheterizations, bypass surgery, and foot surgery.  ALLERGIES:  No known drug allergies.  CURRENT MEDICATIONS: 1. Sublingual nitroglycerin p.r.n. 2. Red wine capsule daily. 3. Prilosec daily. 4. Toprol-XL 25 mg a day. 5. Zocor 40 mg a day. 6. Neurontin 600 mg daily. 7. Enalapril 10 mg a day. 8. Aspirin 81 mg a day. 9. Kombiglyze which is saxagliptin  and metformin 05/998 daily     (diarrhea started after initiation of this medicine in April 2012).  SOCIAL HISTORY:  Lives in [place] with his wife.  He became disabled while he still lived in [place] from his cardiac issues.  He has no history of alcohol, tobacco, or drug abuse.  FAMILY HISTORY:  His mother died at 84 with no heart disease and his father died at 70 from possibly internal bleeding after trauma.  Two siblings have coronary artery disease.  REVIEW OF SYSTEMS:  He has occasional arthralgias and joint pains.  He has felt weak today.  The presyncope and chest pain are described above and full 14-point review of systems is otherwise negative except as stated in the HPI.  PHYSICAL EXAMINATION:  VITAL SIGNS:  Temperature is 98, blood pressure 126/72, pulse 66, respiratory rate 18, O2 saturation 97% on room air. GENERAL:  He is a well-developed Hispanic male in no acute  distress. HEENT:  Normal. NECK:  There is no lymphadenopathy, thyromegaly, bruit, or JVD noted. CV:  His heart is regular in rate and rhythm with an S1-S2 and no significant murmur, rub or gallop is noted.  Distal pulses are intact in all four extremities. LUNGS:  Essentially clear to auscultation bilaterally. SKIN:  No rashes or lesions are noted. ABDOMEN:  Soft and nontender with active bowel sounds. EXTREMITIES:  There is no cyanosis, clubbing, or edema noted. MUSCULOSKELETAL:  There is no joint deformity or effusions and no spine or CVA tenderness. NEUROLOGIC:  He is alert and oriented with cranial nerves II through XII grossly intact.  Chest x-ray shows decreased volume with mild bibasilar atelectasis and possibly left pleural effusion.  Repeat two-view chest x-ray showed improved aeration in the lungs, now appear clear with no pleural effusion and no active cardiopulmonary disease.  EKG; sinus bradycardia, rate 57 with no acute ischemic changes.  LABORATORY VALUES:  Point-of-care markers negative x1.  Hemoglobin 12.4, hematocrit 36.4, WBCs 5.7, platelets 151.  Sodium 140, potassium 3.6, chloride 102, CO2 28, BUN 11, creatinine 1.01, glucose 130.  Other LFTs normal.  Lipase 54.  INR 1.03.  IMPRESSION:  Gregory Friedman was seen by Dr. Deborah Chalk, the patient evaluated and the data reviewed.  He is a 60 year old male with a history of bypass surgery and a mildly abnormal Myoview in July 2011, who is here with prolonged chest pain, which was relieved by nitroglycerin.  He needs a cardiac catheterization to define his anatomy. He has had some exertional chest pain previously and per Dr. Charlott Rakes note in January 2012, the plan was to cath for recurrent symptoms.  The risks and benefits of the procedure were discussed with the patient and his wife and they indicate understanding and agree to proceed.     Gregory Demark, PA-C   ______________________________ Colleen Can. Deborah Chalk,  M.D.    RB/MEDQ  D:  05/25/2010  T:  05/26/2010  Job:  [id number]  Electronically Signed by Gregory Demark PA-C on 06/02/2010 07:53:30 PM Electronically Signed by Roger Shelter M.D. on 06/08/2010 06:05:57 PM

## 2010-06-10 ENCOUNTER — Ambulatory Visit (INDEPENDENT_AMBULATORY_CARE_PROVIDER_SITE_OTHER): Payer: Medicare HMO | Admitting: Internal Medicine

## 2010-06-10 ENCOUNTER — Other Ambulatory Visit (HOSPITAL_COMMUNITY): Payer: Self-pay | Admitting: *Deleted

## 2010-06-10 VITALS — BP 121/79 | HR 62 | Resp 16 | Ht 68.0 in | Wt 191.0 lb

## 2010-06-10 DIAGNOSIS — I2510 Atherosclerotic heart disease of native coronary artery without angina pectoris: Secondary | ICD-10-CM

## 2010-06-10 DIAGNOSIS — I639 Cerebral infarction, unspecified: Secondary | ICD-10-CM

## 2010-06-10 DIAGNOSIS — I10 Essential (primary) hypertension: Secondary | ICD-10-CM

## 2010-06-10 DIAGNOSIS — I6350 Cerebral infarction due to unspecified occlusion or stenosis of unspecified cerebral artery: Secondary | ICD-10-CM

## 2010-06-10 DIAGNOSIS — E785 Hyperlipidemia, unspecified: Secondary | ICD-10-CM

## 2010-06-10 NOTE — Patient Instructions (Signed)
Schedule MRI/ MRA of the head without contrast.  Your physician recommends that you schedule a follow-up appointment in: 1 month

## 2010-06-13 NOTE — Progress Notes (Signed)
HPI Patient is a 60 year old with CAD.  He was recently d/c'd from the hospital where he was admitted for chest pain.  Cardiac cath showed 20% LM:  LAD stent patent; 30% mid LAD;  60% diagonal.  LCx had 70% by 3rd OM.  RCA is dominant.  50% mid lesion.  LIMA to LAD was atretic; SVG to OM 100% (known); LVEF 55%.  Plan was for medical Rx. The patient says since d/c he has been very dizzy.  He wonders if it is because he was given his roommates medicines by accident on the day of d/c.   No Known Allergies  Current Outpatient Prescriptions  Medication Sig Dispense Refill  . aspirin 325 MG tablet Take 325 mg by mouth daily.        . enalapril (VASOTEC) 10 MG tablet Take 10 mg by mouth daily.        [name] gabapentin (NEURONTIN) 600 MG tablet Take 600 mg by mouth daily.        . isosorbide mononitrate (IMDUR) 30 MG 24 hr tablet Take 30 mg by mouth daily.        . meclizine (ANTIVERT) 25 MG tablet Take 25 mg by mouth 3 (three) times daily as needed.        . metoprolol succinate (TOPROL-XL) 25 MG 24 hr tablet Take 25 mg by mouth daily.        . nitroGLYCERIN (NITROSTAT) 0.4 MG SL tablet Place 0.4 mg under the tongue every 5 (five) minutes as needed.        [name] omeprazole (PRILOSEC) 40 MG capsule Take 40 mg by mouth daily.        . Saxagliptin-Metformin (KOMBIGLYZE XR) 05-998 MG TB24 Take by mouth daily.        . simvastatin (ZOCOR) 40 MG tablet Take 40 mg by mouth at bedtime.          Past Medical History  Diagnosis Date  . CAD (coronary artery disease)   . Dyslipidemia   . DM (diabetes mellitus)   . GERD (gastroesophageal reflux disease)     Past Surgical History  Procedure Date  . Coronary artery bypass graft     Family History  Problem Relation Age of Onset  . Stroke      History   Social History  . Marital Status: Married    Spouse Name: N/A    Number of Children: 2  . Years of Education: N/A   Occupational History  . Not on file.   Social History Main Topics  . Smoking  status: Never Smoker   . Smokeless tobacco: Not on file  . Alcohol Use: No  . Drug Use: Not on file  . Sexually Active: Not on file   Other Topics Concern  . Not on file   Social History Narrative  . No narrative on file    Review of Systems:  All systems reviewed.  They are negative to the above problem except as previously stated.  Vital Signs: BP 121/79  Pulse 62  Resp 16  Ht 5\' 8"  (1.727 m)  Wt 191 lb (86.637 kg)  BMI 29.04 kg/m2  Physical Exam  Patient is in NAD  HEENT:  Normocephalic, atraumatic. EOMI, PERRLA.  Neck: JVP is normal. No thyromegaly. No bruits.  Lungs: clear to auscultation. No rales no wheezes.  Heart: Regular rate and rhythm. Normal S1, S2. No S3.   No significant murmurs. PMI not displaced.  Abdomen:  Supple, nontender. Normal bowel  sounds. No masses. No hepatomegaly.  Extremities:   Good distal pulses throughout. No lower extremity edema.  Musculoskeletal :moving all extremities.  Neuro:   alert and oriented x3.  CN II-XII grossly intact.   Assessment and Plan:

## 2010-06-16 ENCOUNTER — Ambulatory Visit (HOSPITAL_COMMUNITY)
Admission: RE | Admit: 2010-06-16 | Discharge: 2010-06-16 | Disposition: A | Discharge status: Home / Self Care | Payer: Medicare HMO | Source: Ambulatory Visit | Attending: Internal Medicine | Admitting: Internal Medicine

## 2010-06-16 ENCOUNTER — Ambulatory Visit (HOSPITAL_COMMUNITY): Payer: Medicare HMO

## 2010-06-16 DIAGNOSIS — E109 Type 1 diabetes mellitus without complications: Secondary | ICD-10-CM | POA: Insufficient documentation

## 2010-06-16 DIAGNOSIS — E785 Hyperlipidemia, unspecified: Secondary | ICD-10-CM | POA: Insufficient documentation

## 2010-06-16 DIAGNOSIS — I10 Essential (primary) hypertension: Secondary | ICD-10-CM | POA: Insufficient documentation

## 2010-06-16 DIAGNOSIS — R42 Dizziness and giddiness: Secondary | ICD-10-CM | POA: Insufficient documentation

## 2010-06-16 DIAGNOSIS — I679 Cerebrovascular disease, unspecified: Secondary | ICD-10-CM | POA: Insufficient documentation

## 2010-06-16 DIAGNOSIS — G319 Degenerative disease of nervous system, unspecified: Secondary | ICD-10-CM | POA: Insufficient documentation

## 2010-06-16 DIAGNOSIS — I639 Cerebral infarction, unspecified: Secondary | ICD-10-CM

## 2010-06-22 NOTE — Assessment & Plan Note (Signed)
Continue simvistatin

## 2010-06-22 NOTE — Assessment & Plan Note (Signed)
Plan is to continue medical Rx.  With dizziness after recent cath will get MRI to exclude CVA.

## 2010-06-22 NOTE — Assessment & Plan Note (Signed)
Good control

## 2010-07-04 ENCOUNTER — Other Ambulatory Visit: Payer: Self-pay | Admitting: *Deleted

## 2010-07-04 DIAGNOSIS — E119 Type 2 diabetes mellitus without complications: Secondary | ICD-10-CM

## 2010-07-04 MED ORDER — SAXAGLIPTIN-METFORMIN ER 5-1000 MG PO TB24: 1.0000 | ORAL_TABLET | Freq: Every day | ORAL | Status: DC

## 2010-07-15 ENCOUNTER — Ambulatory Visit: Payer: Medicare HMO | Attending: Internal Medicine | Admitting: Rehabilitative and Restorative Service Providers"

## 2010-07-15 ENCOUNTER — Ambulatory Visit (INDEPENDENT_AMBULATORY_CARE_PROVIDER_SITE_OTHER): Payer: Medicare HMO | Admitting: Internal Medicine

## 2010-07-15 ENCOUNTER — Encounter: Payer: Self-pay | Admitting: Internal Medicine

## 2010-07-15 DIAGNOSIS — E7800 Pure hypercholesterolemia, unspecified: Secondary | ICD-10-CM

## 2010-07-15 DIAGNOSIS — I2510 Atherosclerotic heart disease of native coronary artery without angina pectoris: Secondary | ICD-10-CM

## 2010-07-15 DIAGNOSIS — R42 Dizziness and giddiness: Secondary | ICD-10-CM | POA: Insufficient documentation

## 2010-07-15 DIAGNOSIS — H8110 Benign paroxysmal vertigo, unspecified ear: Secondary | ICD-10-CM | POA: Insufficient documentation

## 2010-07-15 DIAGNOSIS — R269 Unspecified abnormalities of gait and mobility: Secondary | ICD-10-CM | POA: Insufficient documentation

## 2010-07-15 DIAGNOSIS — IMO0001 Reserved for inherently not codable concepts without codable children: Secondary | ICD-10-CM | POA: Insufficient documentation

## 2010-07-15 DIAGNOSIS — E785 Hyperlipidemia, unspecified: Secondary | ICD-10-CM

## 2010-07-15 LAB — CHOLESTEROL, TOTAL: Cholesterol: 134 mg/dL (ref 0–200)

## 2010-07-15 NOTE — Assessment & Plan Note (Signed)
I spoke with the vestibular clinic today.  They will see him today.

## 2010-07-15 NOTE — Assessment & Plan Note (Signed)
Lipids have not been checked in a while.  I would get today.  Four hours ago he had toast and a piece of cheese.

## 2010-07-15 NOTE — Patient Instructions (Signed)
Lab work today. Lipid ast We will call you with results.

## 2010-07-15 NOTE — Progress Notes (Signed)
HP IPatient is a 60 year old with CAD. He was recently d/c'd from the hospital where he was admitted for chest pain. Cardiac cath showed 20% LM: LAD stent patent; 30% mid LAD; 60% diagonal. LCx had 70% by 3rd OM. RCA is dominant. 50% mid lesion. LIMA to LAD was atretic; SVG to OM 100% (known); LVEF 55%. Plan was for medical Rx.  When I saw him in Mid May he was complaining of dizziness that he had since the catheterization.I recomm Antivert.  I also scheduled a MRI to rule out CVA.  No CVA was demonstrated.  Incidental finding of empty sella was made. I referred him to vestibular rehab.  He has not heard from that clinic. Since I saw him he remains dizzy.  He says that when he turns his head a certain way or turns in bed it gets worse He denies chest pains.  Breathing is OK.     No Known Allergies  Current Outpatient Prescriptions  Medication Sig Dispense Refill  . aspirin 325 MG tablet Take 325 mg by mouth daily.        . enalapril (VASOTEC) 10 MG tablet Take 10 mg by mouth daily.        [name] gabapentin (NEURONTIN) 600 MG tablet Take 600 mg by mouth daily.        . isosorbide mononitrate (IMDUR) 30 MG 24 hr tablet Take 30 mg by mouth daily.        . meclizine (ANTIVERT) 25 MG tablet Take 25 mg by mouth 3 (three) times daily as needed.        . metoprolol succinate (TOPROL-XL) 25 MG 24 hr tablet Take 25 mg by mouth daily.        . nitroGLYCERIN (NITROSTAT) 0.4 MG SL tablet Place 0.4 mg under the tongue every 5 (five) minutes as needed.        [name] omeprazole (PRILOSEC) 40 MG capsule Take 40 mg by mouth daily.        . Saxagliptin-Metformin (KOMBIGLYZE XR) 05-998 MG TB24 Take 1 tablet by mouth daily.  28 tablet  0  . simvastatin (ZOCOR) 40 MG tablet Take 40 mg by mouth at bedtime.          Past Medical History  Diagnosis Date  . CAD (coronary artery disease)   . Dyslipidemia   . DM (diabetes mellitus)   . GERD (gastroesophageal reflux disease)     Past Surgical History  Procedure Date  .  Coronary artery bypass graft     Family History  Problem Relation Age of Onset  . Stroke      History   Social History  . Marital Status: Married    Spouse Name: N/A    Number of Children: 2  . Years of Education: N/A   Occupational History  . Not on file.   Social History Main Topics  . Smoking status: Never Smoker   . Smokeless tobacco: Not on file  . Alcohol Use: No  . Drug Use: Not on file  . Sexually Active: Not on file   Other Topics Concern  . Not on file   Social History Narrative  . No narrative on file    Review of Systems:  All systems reviewed.  They are negative to the above problem except as previously stated.  Vital Signs: BP 139/81  Pulse 56  Ht 5\' 8"  (1.727 m)  Wt 193 lb (87.544 kg)  BMI 29.35 kg/m2  Physical Exam  Patient is  in NAD   HEENT:  Normocephalic, atraumatic. EOMI, PERRLA.  Neck: JVP is normal. No thyromegaly. No bruits.  Lungs: clear to auscultation. No rales no wheezes.  Heart: Regular rate and rhythm. Normal S1, S2. No S3.   No significant murmurs. PMI not displaced.  Abdomen:  Supple, nontender. Normal bowel sounds. No masses. No hepatomegaly.  Extremities:   Good distal pulses throughout. No lower extremity edema.  Musculoskeletal :moving all extremities.  Neuro:   alert and oriented x3.  CN II-XII grossly intact.   Assessment and Plan:

## 2010-07-15 NOTE — Assessment & Plan Note (Signed)
No symptoms to suggest angina.  Keep on same regimen. 

## 2010-07-18 ENCOUNTER — Ambulatory Visit: Payer: Medicare HMO | Admitting: Rehabilitative and Restorative Service Providers"

## 2010-08-18 ENCOUNTER — Other Ambulatory Visit: Payer: Self-pay | Admitting: Family Medicine

## 2010-08-18 MED ORDER — OMEPRAZOLE 40 MG PO CPDR: 40.0000 mg | DELAYED_RELEASE_CAPSULE | Freq: Every day | ORAL | Status: DC

## 2010-08-18 MED ORDER — METOPROLOL SUCCINATE ER 25 MG PO TB24: 25.0000 mg | ORAL_TABLET | Freq: Every day | ORAL | Status: DC

## 2010-08-18 MED ORDER — GABAPENTIN 600 MG PO TABS: 600.0000 mg | ORAL_TABLET | Freq: Every day | ORAL | Status: DC

## 2010-08-18 MED ORDER — SIMVASTATIN 40 MG PO TABS: 40.0000 mg | ORAL_TABLET | Freq: Every day | ORAL | Status: DC

## 2010-08-18 MED ORDER — ENALAPRIL MALEATE 10 MG PO TABS: 10.0000 mg | ORAL_TABLET | Freq: Every day | ORAL | Status: DC

## 2010-08-19 ENCOUNTER — Ambulatory Visit: Payer: Medicare HMO

## 2010-08-22 ENCOUNTER — Other Ambulatory Visit: Payer: Self-pay | Admitting: *Deleted

## 2010-08-22 ENCOUNTER — Ambulatory Visit (INDEPENDENT_AMBULATORY_CARE_PROVIDER_SITE_OTHER): Payer: Medicare HMO | Admitting: Family Medicine

## 2010-08-22 ENCOUNTER — Encounter: Payer: Self-pay | Admitting: Family Medicine

## 2010-08-22 VITALS — BP 124/80 | HR 64 | Ht 68.0 in | Wt 195.0 lb

## 2010-08-22 DIAGNOSIS — E785 Hyperlipidemia, unspecified: Secondary | ICD-10-CM

## 2010-08-22 DIAGNOSIS — Z125 Encounter for screening for malignant neoplasm of prostate: Secondary | ICD-10-CM

## 2010-08-22 DIAGNOSIS — R300 Dysuria: Secondary | ICD-10-CM

## 2010-08-22 DIAGNOSIS — I10 Essential (primary) hypertension: Secondary | ICD-10-CM

## 2010-08-22 DIAGNOSIS — R42 Dizziness and giddiness: Secondary | ICD-10-CM

## 2010-08-22 DIAGNOSIS — E119 Type 2 diabetes mellitus without complications: Secondary | ICD-10-CM

## 2010-08-22 DIAGNOSIS — I2510 Atherosclerotic heart disease of native coronary artery without angina pectoris: Secondary | ICD-10-CM

## 2010-08-22 LAB — LIPID PANEL
Cholesterol: 126 mg/dL (ref 0–200)
Total CHOL/HDL Ratio: 3.5 Ratio
Triglycerides: 243 mg/dL — ABNORMAL HIGH (ref <150)

## 2010-08-22 LAB — POCT UA - MICROSCOPIC ONLY

## 2010-08-22 LAB — POCT URINALYSIS DIPSTICK
Bilirubin, UA: NEGATIVE
Ketones, UA: NEGATIVE
Leukocytes, UA: NEGATIVE
Spec Grav, UA: 1.025

## 2010-08-22 LAB — POCT GLYCOSYLATED HEMOGLOBIN (HGB A1C): Hemoglobin A1C: 6.5

## 2010-08-22 MED ORDER — SAXAGLIPTIN-METFORMIN ER 5-1000 MG PO TB24: 1.0000 | ORAL_TABLET | Freq: Every day | ORAL | Status: DC

## 2010-08-22 NOTE — Progress Notes (Signed)
Patient presents for follow up on diabetes.  In April he was changed to [facility] 05/998.  He is no longer having diarrhea since the metformin dose was decreased.  Denies any side effects.  Sugars are running 95-100 before meals, 160 after meals.  HTN follow up: Blood pressures elsewhere are usually 135-140/85.  Hasn't been getting any exercise recently, usually walks 20 minutes/day.  Plans to restart now that dizziness is improved.  BPV follow up: Dizziness is much improved, s/p vestibular rehab.  Continues with home exercises.  Still has very slight dizziness in head when he is getting into and out of bed (morning and evening).  Only with certain head positions, and very short-lived.  Significantly improved.  He had MRI/MRA (ordered by Dr. Tenny Craw) which was negative for any stroke.  Patient complains of some pain at initiation of voiding for the last month or so.  Denies possibility of STD. Denies urgency/frequency or penile discharge.  Sometimes has slight dribbling of urine.  Feels like he empties bladder completely.   Current Outpatient Prescriptions on File Prior to Visit  Medication Sig Dispense Refill  . enalapril (VASOTEC) 10 MG tablet Take 1 tablet (10 mg total) by mouth daily.  90 tablet  3  . gabapentin (NEURONTIN) 600 MG tablet Take 1 tablet (600 mg total) by mouth daily.  90 tablet  1  . isosorbide mononitrate (IMDUR) 30 MG 24 hr tablet Take 30 mg by mouth as needed.       . metoprolol succinate (TOPROL-XL) 25 MG 24 hr tablet Take 1 tablet (25 mg total) by mouth daily.  90 tablet  3  . nitroGLYCERIN (NITROSTAT) 0.4 MG SL tablet Place 0.4 mg under the tongue every 5 (five) minutes as needed.        [name] omeprazole (PRILOSEC) 40 MG capsule Take 1 capsule (40 mg total) by mouth daily.  90 capsule  3  . simvastatin (ZOCOR) 40 MG tablet Take 1 tablet (40 mg total) by mouth at bedtime.  90 tablet  3   No Known Allergies  Past Medical History  Diagnosis Date  . CAD (coronary artery disease)    . Dyslipidemia   . DM (diabetes mellitus)   . GERD (gastroesophageal reflux disease)    Past Surgical History  Procedure Date  . Coronary artery bypass graft     ROS: Occasional shoulder pain (relieved by prn Aleve).  Denies chest pain, headaches.  Occasional pain in back of neck since the dizziness.  See HPI.  No URI symptoms, fevers, rashes or other concerns  PHYSICAL EXAM:  Well developed, well nourished patient, in no distress BP 124/80  Pulse 64  Ht 5\' 8"  (1.727 m)  Wt 195 lb (88.451 kg)  BMI 29.65 kg/m2 Neck: No lymphadenopathy or thyromegaly, no carotid bruit Heart:  Regular rate and rhythm, no murmurs, rubs, gallops or ectopy Lungs:  Clear bilaterally, without wheezes, rales or ronchi Abdomen:  Soft, nontender, nondistended, no hepatosplenomegaly or masses, normal bowel sounds Extremities:  No clubbing, cyanosis or edema, 2+ pulses.  Neuro:  Alert and oriented x 3, cranial nerves grossly intact. Normal strength and sensation. Some dizziness with laying down and sitting up (with head straight ahead) Back:  No spine or CVA tenderness Skin: no rashes or suspicious lesions Psych:  Normal mood, affect, hygiene and grooming, normal speech, eye contact   ASSESSMENT/PLAN: 1. Type II or unspecified type diabetes mellitus without mention of complication, not stated as uncontrolled  POCT HgB A1C  2. HYPERTENSION,  BENIGN     Initially elevated, improved on repeat.  May be running higher due to lack of exercise recently due to dizziness  3. Dizziness     due to BPV.  Improving  4. Dysuria  POCT urinalysis dipstick, CULTURE, URINE COMPREHENSIVE, POCT UA - Microscopic Only   rule out infection--sent for culture  5. CAD    6. HYPERLIPIDEMIA-MIXED  Lipid panel  7. Special screening for malignant neoplasm of prostate  PSA   DM--A1c 6.5 today. Improved control. Continue current regimen.  Resume daily exercise. Check u/a to evaluate dysuria.  If no evidence of infection, then  continue to keep well hydrated.  If problem seems to be more with delay in starting urine flow (and not pain), then this may be from the prostate.  Consider trial of Saw Palmetto.  Will check PSA today.   HTN--low sodium diet, resume daily exercise.  Monitor blood pressures at home.  Goal Blood pressure is <130/80  BPV--improved, but not resolved. Continue with home exercises.  Use Meclizine prn, and call for additional PT if symptoms are worsening

## 2010-08-22 NOTE — Patient Instructions (Addendum)
Vertigo--Continue with home exercises.  Use Meclizine prn, and call for additional PT if symptoms are worsening  Low sodium (salt) diet, resume daily exercise.  Monitor blood pressures at home.  Goal Blood pressure is  <130/80  Increase your fish oil to 3000-4000mg  daily.  Urinary complaints:  If no evidence of infection, then continue to keep well hydrated.  If problem seems to be more with delay in starting urine flow (and not pain), then this may be from the prostate.  Consider trial of Saw Palmetto.  Will check PSA today.

## 2010-08-24 ENCOUNTER — Telehealth: Payer: Self-pay | Admitting: *Deleted

## 2010-08-24 LAB — CULTURE, URINE COMPREHENSIVE: Colony Count: NO GROWTH

## 2010-08-24 NOTE — Telephone Encounter (Signed)
Left message for patient to return my call to go over urine culture results.

## 2010-08-25 ENCOUNTER — Telehealth: Payer: Self-pay | Admitting: *Deleted

## 2010-08-25 ENCOUNTER — Other Ambulatory Visit: Payer: Self-pay | Admitting: *Deleted

## 2010-08-25 DIAGNOSIS — E785 Hyperlipidemia, unspecified: Secondary | ICD-10-CM

## 2010-08-25 NOTE — Telephone Encounter (Signed)
Spoke with patient, he will come in week after next to do a clean catch urine per Dr.Knapp. Also he will increase his fish oil from 1,000 to 4,000, diet info sent. He also scheduled for 11/21/10 for lipid recheck.

## 2010-09-07 ENCOUNTER — Other Ambulatory Visit: Payer: Medicare HMO

## 2010-09-07 ENCOUNTER — Other Ambulatory Visit: Payer: Self-pay | Admitting: *Deleted

## 2010-09-07 ENCOUNTER — Telehealth: Payer: Self-pay | Admitting: *Deleted

## 2010-09-07 DIAGNOSIS — R3129 Other microscopic hematuria: Secondary | ICD-10-CM

## 2010-09-07 DIAGNOSIS — N390 Urinary tract infection, site not specified: Secondary | ICD-10-CM

## 2010-09-07 LAB — POCT URINALYSIS DIPSTICK
Bilirubin, UA: NEGATIVE
Leukocytes, UA: NEGATIVE
Nitrite, UA: NEGATIVE
Protein, UA: NEGATIVE
Urobilinogen, UA: NEGATIVE
pH, UA: 5

## 2010-09-07 NOTE — Telephone Encounter (Signed)
Spoke with patient and informed him that blood was found in repeat u/a today. He is scheduled with Dr.Ottelin @ [facility] [phone] for Sept 18, 2012 @ 10:00am, pt aware. He is being seen for persistant blood in urine, evaluate microscopic hematuria.

## 2010-09-08 ENCOUNTER — Other Ambulatory Visit: Payer: Medicare HMO

## 2010-10-17 ENCOUNTER — Encounter: Payer: Self-pay | Admitting: Family Medicine

## 2010-10-19 ENCOUNTER — Encounter: Payer: Self-pay | Admitting: Internal Medicine

## 2010-10-20 ENCOUNTER — Ambulatory Visit: Payer: Medicare HMO | Admitting: Family Medicine

## 2010-11-02 ENCOUNTER — Telehealth: Payer: Self-pay | Admitting: Family Medicine

## 2010-11-02 NOTE — Telephone Encounter (Signed)
Called patient to let him know that we did not have any samples of Kombiglyze 5/1,000mg . He can check back occasionally to see if we have any and I would be glad to give him some.

## 2010-11-21 ENCOUNTER — Other Ambulatory Visit: Payer: Medicare HMO

## 2010-11-21 DIAGNOSIS — E785 Hyperlipidemia, unspecified: Secondary | ICD-10-CM

## 2010-11-21 LAB — LIPID PANEL
HDL: 34 mg/dL — ABNORMAL LOW (ref >39)
LDL Cholesterol: 64 mg/dL (ref 0–99)

## 2010-11-24 ENCOUNTER — Ambulatory Visit (INDEPENDENT_AMBULATORY_CARE_PROVIDER_SITE_OTHER): Payer: Medicare HMO | Admitting: Family Medicine

## 2010-11-24 ENCOUNTER — Encounter: Payer: Self-pay | Admitting: Family Medicine

## 2010-11-24 VITALS — BP 126/84 | HR 72 | Ht 68.0 in | Wt 196.0 lb

## 2010-11-24 DIAGNOSIS — E119 Type 2 diabetes mellitus without complications: Secondary | ICD-10-CM

## 2010-11-24 DIAGNOSIS — I10 Essential (primary) hypertension: Secondary | ICD-10-CM

## 2010-11-24 DIAGNOSIS — Z23 Encounter for immunization: Secondary | ICD-10-CM

## 2010-11-24 DIAGNOSIS — E785 Hyperlipidemia, unspecified: Secondary | ICD-10-CM

## 2010-11-24 LAB — POCT GLYCOSYLATED HEMOGLOBIN (HGB A1C): Hemoglobin A1C: 6.7

## 2010-11-24 MED ORDER — OMEGA-3 FATTY ACIDS 1000 MG PO CAPS: ORAL_CAPSULE | ORAL | Status: DC

## 2010-11-24 MED ORDER — SAXAGLIPTIN-METFORMIN ER 5-1000 MG PO TB24: 1.0000 | ORAL_TABLET | Freq: Every day | ORAL | Status: DC

## 2010-11-24 NOTE — Progress Notes (Signed)
Patient presents for f/u on Diabetes and Lipids.  Diabetes follow-up:  Blood sugars at home are running 100-130.  Denies hypoglycemia.  Denies polydipsia but continues to have some increase urinary frequency.  He has started the saw palmetto which has helped some.  Also feeling better and sugars lower since he stopped drinking cranberry juice.  Last eye exam was in the spring 2012.  Patient follows a low sugar diet and checks feet regularly without concerns.  Cholesterol follow-up.  Never increased the fish oil--just taking one daily. Compliant with taking Simvastatin.  Denies side effects, muscle aches.  Follows a lowfat diet.  Having some discomfort in his joints--ankle, elbow, shoulder, knee.  Starting taking Aleve, 2 tablets once daily.  Doesn't take it every day if he isn't having pain.  Denies any GI complaints  Hematuria.  Patient admits that he didn't have the money to pay for the copay for the urologist.  He never rescheduled visit for evaluation of microscopic hematuria.  Given that he is also having some prostate symptoms (although mild), he seems wiling to reschedule appt now.  Past Medical History  Diagnosis Date  . CAD (coronary artery disease)   . Dyslipidemia   . DM (diabetes mellitus)   . GERD (gastroesophageal reflux disease)   . Hypertension   . Hyperlipidemia   . Diabetes mellitus, type 2   . Microscopic hematuria     referred to urology 2012    Past Surgical History  Procedure Date  . Coronary artery bypass graft     WITH STENTS  . Fracture surgery     RT TALAR REPAIR  . Cardiac catheterization 05/2010  . Cardiovascular stress test 07/2009    History   Social History  . Marital Status: Married    Spouse Name: N/A    Number of Children: 2  . Years of Education: N/A   Occupational History  . Not on file.   Social History Main Topics  . Smoking status: Never Smoker   . Smokeless tobacco: Not on file  . Alcohol Use: No  . Drug Use: No  . Sexually  Active: Not on file   Other Topics Concern  . Not on file   Social History Narrative  . No narrative on file    Family History  Problem Relation Age of Onset  . Stroke      Current outpatient prescriptions:ALOE PO, Take 1 tablet by mouth daily.  , Disp: , Rfl: ;  aspirin 81 MG tablet, Take 81 mg by mouth daily.  , Disp: , Rfl: ;  Cetirizine HCl (ZYRTEC ALLERGY PO), Take 1 tablet by mouth daily.  , Disp: , Rfl: ;  enalapril (VASOTEC) 10 MG tablet, Take 1 tablet (10 mg total) by mouth daily., Disp: 90 tablet, Rfl: 3 fish oil-omega-3 fatty acids 1000 MG capsule, Take 4 capsules daily (either all at once, or can take 2 capsules twice daily), Disp: , Rfl: ;  gabapentin (NEURONTIN) 600 MG tablet, Take 1 tablet (600 mg total) by mouth daily., Disp: 90 tablet, Rfl: 1;  isosorbide mononitrate (IMDUR) 30 MG 24 hr tablet, Take 30 mg by mouth as needed. , Disp: , Rfl:  metoprolol succinate (TOPROL-XL) 25 MG 24 hr tablet, Take 1 tablet (25 mg total) by mouth daily., Disp: 90 tablet, Rfl: 3;  naproxen sodium (ANAPROX) 220 MG tablet, Take 440 mg by mouth daily.  , Disp: , Rfl: ;  omeprazole (PRILOSEC) 40 MG capsule, Take 1 capsule (40 mg total) by  mouth daily., Disp: 90 capsule, Rfl: 3;  Saw Palmetto 450 MG CAPS, Take 2 capsules by mouth daily.  , Disp: , Rfl:  Saxagliptin-Metformin (KOMBIGLYZE XR) 05-998 MG TB24, Take 1 tablet by mouth daily., Disp: 30 tablet, Rfl: 5;  simvastatin (ZOCOR) 40 MG tablet, Take 1 tablet (40 mg total) by mouth at bedtime., Disp: 90 tablet, Rfl: 3;  nitroGLYCERIN (NITROSTAT) 0.4 MG SL tablet, Place 0.4 mg under the tongue every 5 (five) minutes as needed.  , Disp: , Rfl:   No Known Allergies  ROS:  Denies fevers, headaches, chest pain, palpitations, URI symptoms, cough, shortness of breath.  Dizziness is much improved, only very slight residual. No numbness, tingling, skin lesions or concerns, no GI complaints.  See HPI.  PHYSICAL EXAM: BP 126/84  Pulse 72  Ht 5\' 8"  (1.727 m)   Wt 196 lb (88.905 kg)  BMI 29.80 kg/m2 Well developed, pleasant male in no distress Neck: no lymphadenopathy, thyromegaly or carotid bruit Heart: regular rate and rhythm without murmur Lungs: clear bilaterally Abdomen: soft, nontender, no organomegaly or mass Extremities: no clubbing, cyanosis or edema. 2+ pulse. Normal monofilament exam Skin: no lesions Psych: normal mood, affect, hygiene and grooming  Lab Results  Component Value Date   HGBA1C 6.7 11/24/2010   Lab Results  Component Value Date   CHOL 132 11/21/2010   HDL 34* 11/21/2010   LDLCALC 64 11/21/2010   TRIG 170* 11/21/2010   CHOLHDL 3.9 11/21/2010   ASSESSMENT/PLAN: 1. Type II or unspecified type diabetes mellitus without mention of complication, not stated as uncontrolled  POCT HgB A1C, Saxagliptin-Metformin (KOMBIGLYZE XR) 05-998 MG TB24  2. Need for influenza vaccination  Flu vaccine greater than or equal to 3yo preservative free IM  3. HYPERLIPIDEMIA-MIXED     improved, TG still borderline. Increase fish oil to 4gms daily and continue lowfat diet and simvastatin  4. HYPERTENSION, BENIGN     controlled   Reschedule urologist appt--reasons and importance reviewed.  F/u 6 months, sooner prn

## 2010-11-24 NOTE — Patient Instructions (Addendum)
Increase fish oil capsules to 4 per day (either all at once, or can split it into 2 capsules twice daily). Start exercising at least 30 minutes every day. Please call [facility] to reschedule your consult for blood in the urine.

## 2010-11-25 ENCOUNTER — Encounter: Payer: Self-pay | Admitting: Family Medicine

## 2010-11-25 DIAGNOSIS — E119 Type 2 diabetes mellitus without complications: Secondary | ICD-10-CM | POA: Insufficient documentation

## 2010-12-28 ENCOUNTER — Telehealth: Payer: Self-pay | Admitting: Family Medicine

## 2010-12-28 NOTE — Telephone Encounter (Signed)
DONE

## 2011-01-05 ENCOUNTER — Other Ambulatory Visit: Payer: Self-pay | Admitting: Family Medicine

## 2011-01-18 ENCOUNTER — Other Ambulatory Visit: Payer: Self-pay | Admitting: Urology

## 2011-01-18 ENCOUNTER — Other Ambulatory Visit (HOSPITAL_COMMUNITY): Payer: Self-pay | Admitting: Urology

## 2011-01-18 DIAGNOSIS — R3129 Other microscopic hematuria: Secondary | ICD-10-CM

## 2011-01-18 DIAGNOSIS — N400 Enlarged prostate without lower urinary tract symptoms: Secondary | ICD-10-CM

## 2011-01-20 ENCOUNTER — Ambulatory Visit (HOSPITAL_COMMUNITY)
Admission: RE | Admit: 2011-01-20 | Discharge: 2011-01-20 | Disposition: A | Discharge status: Home / Self Care | Payer: Medicare HMO | Source: Ambulatory Visit | Attending: Urology | Admitting: Urology

## 2011-01-20 DIAGNOSIS — R1031 Right lower quadrant pain: Secondary | ICD-10-CM | POA: Insufficient documentation

## 2011-01-20 DIAGNOSIS — N400 Enlarged prostate without lower urinary tract symptoms: Secondary | ICD-10-CM | POA: Insufficient documentation

## 2011-01-20 DIAGNOSIS — R933 Abnormal findings on diagnostic imaging of other parts of digestive tract: Secondary | ICD-10-CM | POA: Insufficient documentation

## 2011-01-20 DIAGNOSIS — R599 Enlarged lymph nodes, unspecified: Secondary | ICD-10-CM | POA: Insufficient documentation

## 2011-01-20 DIAGNOSIS — R319 Hematuria, unspecified: Secondary | ICD-10-CM | POA: Insufficient documentation

## 2011-01-20 LAB — CREATININE, SERUM
Creatinine, Ser: 1.3 mg/dL (ref 0.50–1.35)
GFR calc non Af Amer: 58 mL/min — ABNORMAL LOW (ref >90)

## 2011-01-20 MED ORDER — IOHEXOL 300 MG/ML  SOLN
125.0000 mL | Freq: Once | INTRAMUSCULAR | Status: AC | PRN
  Administered 2011-01-20: 125 mL via INTRAVENOUS

## 2011-01-26 ENCOUNTER — Other Ambulatory Visit: Payer: Self-pay | Admitting: *Deleted

## 2011-01-26 DIAGNOSIS — E119 Type 2 diabetes mellitus without complications: Secondary | ICD-10-CM

## 2011-01-26 MED ORDER — SAXAGLIPTIN-METFORMIN ER 5-1000 MG PO TB24: 1.0000 | ORAL_TABLET | Freq: Every day | ORAL | Status: DC

## 2011-02-02 ENCOUNTER — Ambulatory Visit (INDEPENDENT_AMBULATORY_CARE_PROVIDER_SITE_OTHER): Payer: Medicare HMO | Admitting: Internal Medicine

## 2011-02-02 ENCOUNTER — Encounter: Payer: Self-pay | Admitting: Internal Medicine

## 2011-02-02 VITALS — BP 134/85 | HR 61 | Ht 68.0 in | Wt 200.0 lb

## 2011-02-02 DIAGNOSIS — R002 Palpitations: Secondary | ICD-10-CM

## 2011-02-02 DIAGNOSIS — E785 Hyperlipidemia, unspecified: Secondary | ICD-10-CM

## 2011-02-02 DIAGNOSIS — I10 Essential (primary) hypertension: Secondary | ICD-10-CM

## 2011-02-02 DIAGNOSIS — I2510 Atherosclerotic heart disease of native coronary artery without angina pectoris: Secondary | ICD-10-CM

## 2011-02-02 NOTE — Assessment & Plan Note (Signed)
Keep on current regimen. 

## 2011-02-02 NOTE — Assessment & Plan Note (Signed)
If he continues to have then I would recommend an event monitor.  He should call if symptoms continue.

## 2011-02-02 NOTE — Assessment & Plan Note (Signed)
No symptoms to suggest angina.  Keep on current regimen. 

## 2011-02-02 NOTE — Progress Notes (Addendum)
HPI  Patient is a 61 year old with a history of CAD  Cath in 2012 showed 20% LM; Patent stent to LAD; 60% diagonal.  70% LCx;  50% mid RCA.  LIMA to LAD was atretic.  SVG toOM 100% (known); LVEF was 55%. Since seen has noticed palpitations at rest over the past couple weeks.  No dizziness.  Last a couple minutes.  Last spell 2 days ago.   Now being bother by urologic problem.  Pelvic pressure  No Known Allergies  Current Outpatient Prescriptions  Medication Sig Dispense Refill  . ALOE PO Take 1 tablet by mouth daily.        [name] aspirin 81 MG tablet Take 81 mg by mouth daily.        . enalapril (VASOTEC) 10 MG tablet Take 1 tablet (10 mg total) by mouth daily.  90 tablet  3  . fish oil-omega-3 fatty acids 1000 MG capsule Take 4 capsules daily (either all at once, or can take 2 capsules twice daily)      . isosorbide mononitrate (IMDUR) 30 MG 24 hr tablet Take 30 mg by mouth as needed.       . loratadine (CLARITIN) 10 MG tablet Take 10 mg by mouth daily.      . metoprolol succinate (TOPROL-XL) 25 MG 24 hr tablet Take 1 tablet (25 mg total) by mouth daily.  90 tablet  3  . naproxen sodium (ANAPROX) 220 MG tablet Take 440 mg by mouth daily.        . nitroGLYCERIN (NITROSTAT) 0.4 MG SL tablet Place 0.4 mg under the tongue every 5 (five) minutes as needed.        [name] omeprazole (PRILOSEC) 40 MG capsule Take 1 capsule (40 mg total) by mouth daily.  90 capsule  3  . Saw Palmetto 450 MG CAPS Take 2 capsules by mouth daily.        . Saxagliptin-Metformin (KOMBIGLYZE XR) 05-998 MG TB24 Take 1 tablet by mouth daily.  28 tablet  0  . simvastatin (ZOCOR) 40 MG tablet Take 1 tablet (40 mg total) by mouth at bedtime.  90 tablet  3    Past Medical History  Diagnosis Date  . CAD (coronary artery disease)   . Dyslipidemia   . DM (diabetes mellitus)   . GERD (gastroesophageal reflux disease)   . Hypertension   . Hyperlipidemia   . Diabetes mellitus, type 2   . Microscopic hematuria     referred to urology  2012    Past Surgical History  Procedure Date  . Coronary artery bypass graft     WITH STENTS  . Fracture surgery     RT TALAR REPAIR  . Cardiac catheterization 05/2010  . Cardiovascular stress test 07/2009    Family History  Problem Relation Age of Onset  . Stroke      History   Social History  . Marital Status: Married    Spouse Name: N/A    Number of Children: 2  . Years of Education: N/A   Occupational History  . Not on file.   Social History Main Topics  . Smoking status: Never Smoker   . Smokeless tobacco: Not on file  . Alcohol Use: No  . Drug Use: No  . Sexually Active: Not on file   Other Topics Concern  . Not on file   Social History Narrative  . No narrative on file    Review of Systems:  All systems reviewed.  They are negative to the above problem except as previously stated.  Vital Signs: BP 134/85  Pulse 61  Ht 5\' 8"  (1.727 m)  Wt 200 lb (90.719 kg)  BMI 30.41 kg/m2  Physical Exam  HEENT:  Normocephalic, atraumatic. EOMI, PERRLA.  Neck: JVP is normal. No thyromegaly. No bruits.  Lungs: clear to auscultation. No rales no wheezes.  Heart: Regular rate and rhythm. Normal S1, S2. No S3.   No significant murmurs. PMI not displaced.  Abdomen:  Supple, nontender. Normal bowel sounds. No masses. No hepatomegaly.  Extremities:   Good distal pulses throughout. No lower extremity edema.  Musculoskeletal :moving all extremities.  Neuro:   alert and oriented x3.  CN II-XII grossly intact.  EKG:  SInus rhythm.  60   Septal MI.     Assessment and Plan:

## 2011-02-03 ENCOUNTER — Encounter (HOSPITAL_COMMUNITY): Payer: Self-pay | Admitting: Pharmacy Technician

## 2011-02-03 ENCOUNTER — Encounter (HOSPITAL_COMMUNITY)
Admission: RE | Admit: 2011-02-03 | Discharge: 2011-02-03 | Disposition: A | Discharge status: Home / Self Care | Payer: Medicare HMO | Source: Ambulatory Visit | Attending: Urology | Admitting: Urology

## 2011-02-03 ENCOUNTER — Encounter (HOSPITAL_COMMUNITY): Payer: Self-pay

## 2011-02-03 HISTORY — DX: Acute myocardial infarction, unspecified: I21.9

## 2011-02-03 HISTORY — DX: Unspecified osteoarthritis, unspecified site: M19.90

## 2011-02-03 HISTORY — DX: Benign prostatic hyperplasia without lower urinary tract symptoms: N40.0

## 2011-02-03 LAB — BASIC METABOLIC PANEL
CO2: 33 mEq/L — ABNORMAL HIGH (ref 19–32)
Calcium: 10.3 mg/dL (ref 8.4–10.5)
Chloride: 99 mEq/L (ref 96–112)
Glucose, Bld: 147 mg/dL — ABNORMAL HIGH (ref 70–99)
Sodium: 140 mEq/L (ref 135–145)

## 2011-02-03 LAB — HEMOGLOBIN AND HEMATOCRIT, BLOOD
HCT: 43 % (ref 39.0–52.0)
Hemoglobin: 14.1 g/dL (ref 13.0–17.0)

## 2011-02-03 NOTE — Patient Instructions (Addendum)
C20 Gregory Friedman  02/03/2011   Your procedure is scheduled on:  02/09/2011  Report to [facility] at  830  AM.  Call this number if you have problems the morning of surgery: [phone]   Remember:   Do not eat food:After Midnight.  May have clear liquids:until Midnight .  Clear liquids include soda, tea, black coffee, apple or grape juice, broth.  Take these medicines the morning of surgery with A SIP OF WATER:  Vasotec, Imdur,Claritin, toprol xl,prilosec   Do not wear jewelry, make-up or nail polish.  Do not wear lotions, powders, or perfumes. You may wear deodorant.  Do not shave 48 hours prior to surgery.  Do not bring valuables to the hospital.  Contacts, dentures or bridgework may not be worn into surgery.  Leave suitcase in the car. After surgery it may be brought to your room.  For patients admitted to the hospital, checkout time is 11:00 AM the day of discharge.   Patients discharged the day of surgery will not be allowed to drive home.  Name and phone number of your driver: family Special Instructions: CHG Shower Use Special Wash: 1/2 bottle night before surgery and 1/2 bottle morning of surgery.   Please read over the following fact sheets that you were given: Pain Booklet, MRSA Information, Surgical Site Infection Prevention, Anesthesia Post-op Instructions and Care and Recovery After Surgery ystoscopy (Bladder Exam) A cystoscopy is an examination of your urinary bladder with a cystoscope. A cystoscope is an instrument like a small telescope with strong lights and lenses. It is inserted into the bladder through the urethra (the opening into the bladder) and allows your caregiver to examine the inside of your bladder. The procedure causes little discomfort and can be done in a hospital or office. It is a diagnostic procedure to evaluate the inside of your bladder. It may involve x-rays to further evaluate the ureters or internal aspects of the kidneys. It may aid in the removal  of urinary stones  or in taking tissue samples (biopsies) if necessary. The procedure is easier in females because of a shorter urethra. In a male, the procedure must be done through the penis. This often requires more sedation and more time to do the procedure. The procedure usually takes twenty minutes to one half hour for a male and approximately an hour for a male. LET YOUR CAREGIVERS KNOW ABOUT:  Allergies.   Medications taken including herbs, eye drops, over the counter medications, and creams.   Use of steroids (by mouth or creams).   Previous problems with anesthetics or novocaine.   Possibility of pregnancy, if this applies.   History of blood clots (thrombophlebitis).   History of bleeding or blood problems.   Previous surgery, especially where prosthetics have been used like hip or knee replacements, and heart valve replacements.   Other health problems.  BEFORE THE PROCEDURE  You should be present 60 minutes prior to your procedure or as directed.  PROCEDURE During the procedure, you will:  Be assisted by your urologist and a nurse.   Lie on a cystoscopy table with your knees elevated and legs apart and covered with a drape. For women this is the same position as when a pap smear is taken.   Have the urethral area or penis washed and covered with sterile towels.   Have an anesthetic (numbing) jelly applied to the urethra. This is usually all that is required for females but males may also require sedation.  Have the cystoscope inserted through the urethra and into the bladder. Sterile fluid will flow through the cystoscope and into the bladder. This will expand the bladder and provide clear fluid for the urologist to look through and examine the interior of the bladder.   Be allowed to go home once you are doing well, are stable, and awake if you were given a sedative. If given a sedative, have someone give you a ride home.  AFTER THE PROCEDURE  You may have  temporary bleeding and burning on urination.   Drink lots of fluids.  SEEK IMMEDIATE MEDICAL CARE IF:  There is an increase in blood in the urine or if you are passing clots.   You have difficulty in passing your urine   You develop chills and/or an unexplained oral temperature above 102 F (38.9 C).  Your caregiver will discuss your results with you following the procedure. This may be at a later time if you have been sedated. If other testing or biopsies were taken, ask your caregiver how you are to obtain the results. Remember it is your responsibility to get your results. Do not assume everything is normal if you do not hear from your caregiver. Document Released: 01/07/2000 Document Revised: 09/21/2010 Document Reviewed: 10/31/2007 [facility] Patient Information 2012 [place], [place].PATIENT INSTRUCTIONS POST-ANESTHESIA  IMMEDIATELY FOLLOWING SURGERY:  Do not drive or operate machinery for the first twenty four hours after surgery.  Do not make any important decisions for twenty four hours after surgery or while taking narcotic pain medications or sedatives.  If you develop intractable nausea and vomiting or a severe headache please notify your doctor immediately.  FOLLOW-UP:  Please make an appointment with your surgeon as instructed. You do not need to follow up with anesthesia unless specifically instructed to do so.  WOUND CARE INSTRUCTIONS (if applicable):  Keep a dry clean dressing on the anesthesia/puncture wound site if there is drainage.  Once the wound has quit draining you may leave it open to air.  Generally you should leave the bandage intact for twenty four hours unless there is drainage.  If the epidural site drains for more than 36-48 hours please call the anesthesia department.  QUESTIONS?:  Please feel free to call your physician or the hospital operator if you have any questions, and they will be happy to assist you.     [facility] Anesthesia Department [address] [place] [phone]

## 2011-02-06 ENCOUNTER — Telehealth: Payer: Self-pay | Admitting: *Deleted

## 2011-02-06 NOTE — Telephone Encounter (Signed)
Okay to give results--prostate is a little enlarged.  Main concern is that of inflammation of intestine with some lymph nodes.  Needs to f/u with GI. If having any urologic symptoms, then f/u with urologist

## 2011-02-06 NOTE — Telephone Encounter (Signed)
Patient's wife, Gregory Friedman called to let Dr.Knapp know that Gregory Friedman had a CT done 01/20/11 by Dr.Javaid(urologist in [place]) and they were never really given the results just told to follow up with primary care. They are not sure what they are to do at this point. They are very concerned about the results, wondering if perhaps Gregory Friedman has cancer? Results are in the system. Please advise.

## 2011-02-08 ENCOUNTER — Encounter: Payer: Self-pay | Admitting: Gastroenterology

## 2011-02-08 ENCOUNTER — Telehealth: Payer: Self-pay | Admitting: *Deleted

## 2011-02-08 NOTE — Telephone Encounter (Signed)
Called patient to let him know that he has an appt with [facility]- Dr. Arlyce Dice 02/10/11 @3pm .

## 2011-02-09 ENCOUNTER — Encounter (HOSPITAL_COMMUNITY): Payer: Self-pay | Admitting: Anesthesiology

## 2011-02-09 ENCOUNTER — Encounter (HOSPITAL_COMMUNITY): Payer: Self-pay | Admitting: *Deleted

## 2011-02-09 ENCOUNTER — Encounter (HOSPITAL_COMMUNITY)
Admission: RE | Disposition: A | Discharge status: Home / Self Care | Payer: Self-pay | Source: Ambulatory Visit | Attending: Urology

## 2011-02-09 ENCOUNTER — Ambulatory Visit (HOSPITAL_COMMUNITY)
Admission: RE | Admit: 2011-02-09 | Discharge: 2011-02-09 | Disposition: A | Discharge status: Home / Self Care | Payer: Medicare HMO | Source: Ambulatory Visit | Attending: Urology | Admitting: Urology

## 2011-02-09 ENCOUNTER — Ambulatory Visit (HOSPITAL_COMMUNITY): Payer: Medicare HMO | Admitting: Anesthesiology

## 2011-02-09 DIAGNOSIS — E119 Type 2 diabetes mellitus without complications: Secondary | ICD-10-CM

## 2011-02-09 DIAGNOSIS — R3129 Other microscopic hematuria: Secondary | ICD-10-CM | POA: Insufficient documentation

## 2011-02-09 DIAGNOSIS — Z01812 Encounter for preprocedural laboratory examination: Secondary | ICD-10-CM | POA: Insufficient documentation

## 2011-02-09 DIAGNOSIS — N400 Enlarged prostate without lower urinary tract symptoms: Secondary | ICD-10-CM | POA: Insufficient documentation

## 2011-02-09 HISTORY — PX: CYSTOSCOPY: SHX5120

## 2011-02-09 SURGERY — CYSTOSCOPY
Anesthesia: General | Site: Bladder | Wound class: Clean

## 2011-02-09 MED ORDER — MIDAZOLAM HCL 2 MG/2ML IJ SOLN
1.0000 mg | INTRAMUSCULAR | Status: DC | PRN
  Administered 2011-02-09: 2 mg via INTRAVENOUS

## 2011-02-09 MED ORDER — FENTANYL CITRATE 0.05 MG/ML IJ SOLN: 25.0000 ug | INTRAMUSCULAR | Status: DC | PRN

## 2011-02-09 MED ORDER — FENTANYL CITRATE 0.05 MG/ML IJ SOLN
INTRAMUSCULAR | Status: DC | PRN
  Administered 2011-02-09: 50 ug via INTRAVENOUS

## 2011-02-09 MED ORDER — LIDOCAINE HCL (PF) 1 % IJ SOLN
INTRAMUSCULAR | Status: AC
  Filled 2011-02-09: qty 5

## 2011-02-09 MED ORDER — ONDANSETRON HCL 4 MG/2ML IJ SOLN: 4.0000 mg | Freq: Once | INTRAMUSCULAR | Status: DC | PRN

## 2011-02-09 MED ORDER — MIDAZOLAM HCL 2 MG/2ML IJ SOLN
INTRAMUSCULAR | Status: AC
  Filled 2011-02-09: qty 2

## 2011-02-09 MED ORDER — LACTATED RINGERS IV SOLN
INTRAVENOUS | Status: DC
  Administered 2011-02-09: 1000 mL via INTRAVENOUS

## 2011-02-09 MED ORDER — SODIUM CHLORIDE 0.9 % IR SOLN
Status: DC | PRN
  Administered 2011-02-09: 3000 mL

## 2011-02-09 MED ORDER — TAMSULOSIN HCL 0.4 MG PO CAPS: 0.4000 mg | ORAL_CAPSULE | Freq: Every day | ORAL | Status: DC

## 2011-02-09 MED ORDER — MIDAZOLAM HCL 5 MG/5ML IJ SOLN
INTRAMUSCULAR | Status: DC | PRN
  Administered 2011-02-09: 2 mg via INTRAVENOUS

## 2011-02-09 MED ORDER — PROPOFOL 10 MG/ML IV EMUL
INTRAVENOUS | Status: AC
  Filled 2011-02-09: qty 20

## 2011-02-09 MED ORDER — FENTANYL CITRATE 0.05 MG/ML IJ SOLN
INTRAMUSCULAR | Status: AC
  Filled 2011-02-09: qty 2

## 2011-02-09 MED ORDER — MIDAZOLAM HCL 2 MG/2ML IJ SOLN
INTRAMUSCULAR | Status: AC
  Administered 2011-02-09: 2 mg via INTRAVENOUS
  Filled 2011-02-09: qty 2

## 2011-02-09 MED ORDER — PROPOFOL 10 MG/ML IV BOLUS
INTRAVENOUS | Status: DC | PRN
  Administered 2011-02-09: 140 mg via INTRAVENOUS

## 2011-02-09 MED ORDER — LIDOCAINE HCL 1 % IJ SOLN
INTRAMUSCULAR | Status: DC | PRN
  Administered 2011-02-09: 50 mg via INTRADERMAL

## 2011-02-09 SURGICAL SUPPLY — 18 items
BAG DRAIN URO TABLE W/ADPT NS (DRAPE) ×2 IMPLANT
BAG HAMPER (MISCELLANEOUS) ×2 IMPLANT
CATH FOLEY 2WAY SLVR  5CC 18FR (CATHETERS)
CATH FOLEY 2WAY SLVR 5CC 18FR (CATHETERS) IMPLANT
CLOTH BEACON ORANGE TIMEOUT ST (SAFETY) ×2 IMPLANT
GLOVE BIO SURGEON STRL SZ7 (GLOVE) ×2 IMPLANT
GLOVE ECLIPSE 6.5 STRL STRAW (GLOVE) ×2 IMPLANT
GLOVE EXAM NITRILE XS STR PU (GLOVE) ×2 IMPLANT
GLOVE INDICATOR 7.0 STRL GRN (GLOVE) ×2 IMPLANT
GOWN STRL REIN XL XLG (GOWN DISPOSABLE) ×4 IMPLANT
IV NS IRRIG 3000ML ARTHROMATIC (IV SOLUTION) ×2 IMPLANT
KIT ROOM TURNOVER AP CYSTO (KITS) ×2 IMPLANT
MANIFOLD NEPTUNE II (INSTRUMENTS) ×2 IMPLANT
PACK CYSTO (CUSTOM PROCEDURE TRAY) ×2 IMPLANT
PAD ARMBOARD 7.5X6 YLW CONV (MISCELLANEOUS) ×2 IMPLANT
SYRINGE IRR TOOMEY STRL 70CC (SYRINGE) IMPLANT
TOWEL OR 17X26 4PK STRL BLUE (TOWEL DISPOSABLE) IMPLANT
WATER STERILE IRR 1000ML POUR (IV SOLUTION) ×2 IMPLANT

## 2011-02-09 NOTE — Anesthesia Postprocedure Evaluation (Signed)
  Anesthesia Post-op Note  Patient: Gregory Friedman  Procedure(s) Performed:  CYSTOSCOPY  Patient Location: PACU  Anesthesia Type: General  Level of Consciousness: awake, alert , oriented and patient cooperative  Airway and Oxygen Therapy: Patient Spontanous Breathing  Post-op Pain: none  Post-op Assessment: Post-op Vital signs reviewed, Patient's Cardiovascular Status Stable, Respiratory Function Stable, Patent Airway and No signs of Nausea or vomiting  Post-op Vital Signs: Reviewed and stable  Complications: No apparent anesthesia complications

## 2011-02-09 NOTE — Transfer of Care (Signed)
Immediate Anesthesia Transfer of Care Note  Patient: Gregory Friedman  Procedure(s) Performed:  CYSTOSCOPY  Patient Location: PACU  Anesthesia Type: General  Level of Consciousness: awake and patient cooperative  Airway & Oxygen Therapy: Patient Spontanous Breathing and Patient connected to face mask oxygen  Post-op Assessment: Report given to PACU RN, Post -op Vital signs reviewed and stable and Patient moving all extremities  Post vital signs: Reviewed and stable Filed Vitals:   02/09/11 1116  BP: 144/66  Pulse: 69  Temp: 36.7 C  Resp: 16    Complications: No apparent anesthesia complications

## 2011-02-09 NOTE — Anesthesia Preprocedure Evaluation (Addendum)
Anesthesia Evaluation  Patient identified by MRN, date of birth, ID band Patient awake    Reviewed: Allergy & Precautions, H&P , NPO status , Patient's Chart, lab work & pertinent test results  Airway Mallampati: II TM Distance: >3 FB     Dental  (+) Teeth Intact   Pulmonary neg pulmonary ROS,  clear to auscultation        Cardiovascular hypertension, Pt. on medications - angina+ CAD and + Past MI Regular Normal    Neuro/Psych    GI/Hepatic GERD- (no reflux now)  Medicated and Controlled,  Endo/Other  Diabetes mellitus-, Well Controlled, Type 2, Oral Hypoglycemic Agents  Renal/GU      Musculoskeletal   Abdominal   Peds  Hematology   Anesthesia Other Findings   Reproductive/Obstetrics                           Anesthesia Physical Anesthesia Plan  ASA: III  Anesthesia Plan: General   Post-op Pain Management:    Induction: Intravenous  Airway Management Planned: LMA  Additional Equipment:   Intra-op Plan:   Post-operative Plan: Extubation in OR  Informed Consent: I have reviewed the patients History and Physical, chart, labs and discussed the procedure including the risks, benefits and alternatives for the proposed anesthesia with the patient or authorized representative who has indicated his/her understanding and acceptance.     Plan Discussed with:   Anesthesia Plan Comments:         Anesthesia Quick Evaluation

## 2011-02-09 NOTE — Progress Notes (Signed)
No change in H&P on reexamination. 

## 2011-02-09 NOTE — Brief Op Note (Signed)
02/09/2011  11:02 AM  PATIENT:  Hassell Done  61 y.o. male  PRE-OPERATIVE DIAGNOSIS:  benign prostatic hypertrophy  POST-OPERATIVE DIAGNOSIS:  benign prostatic hypertrophy  PROCEDURE:  Procedure(s): CYSTOSCOPY  SURGEON:  Surgeon(s): Ky Barban, MD  PHYSICIAN ASSISTANT:   ASSISTANTS: none   ANESTHESIA:   general  EBL:  Total I/O In: 300 [I.V.:300] Out: -   BLOOD ADMINISTERED:none  DRAINS: none   LOCAL MEDICATIONS USED:  NONE  SPECIMEN:  No Specimen  DISPOSITION OF SPECIMEN:  N/A  COUNTS:  YES  TOURNIQUET:  * No tourniquets in log *  DICTATION: .Other Dictation: Dictation Number [id number]  PLAN OF CARE: Discharge to home after PACU  PATIENT DISPOSITION:  PACU - hemodynamically stable.   Delay start of Pharmacological VTE agent (>24hrs) due to surgical blood loss or risk of bleeding:  {YES

## 2011-02-09 NOTE — Progress Notes (Signed)
Dr Jayme Cloud notified of fsbs 114. No new orders given.

## 2011-02-09 NOTE — Anesthesia Procedure Notes (Signed)
Procedure Name: LMA Insertion Date/Time: 02/09/2011 10:42 AM Performed by: Despina Hidden Pre-anesthesia Checklist: Patient identified, Suction available, Emergency Drugs available and Patient being monitored Patient Re-evaluated:Patient Re-evaluated prior to inductionOxygen Delivery Method: Circle System Utilized Preoxygenation: Pre-oxygenation with 100% oxygen Intubation Type: IV induction Ventilation: Mask ventilation without difficulty LMA Size: 4.0 Number of attempts: 1 Placement Confirmation: positive ETCO2 and breath sounds checked- equal and bilateral Tube secured with: Tape Dental Injury: Teeth and Oropharynx as per pre-operative assessment

## 2011-02-09 NOTE — H&P (Signed)
NAME:  Gregory Friedman, HINZ NO.:  [id number]  MEDICAL RECORD NO.:  [id number]  LOCATION:                                 FACILITY:  PHYSICIAN:  Ky Barban, M.D.DATE OF BIRTH:  25-Feb-1950  DATE OF ADMISSION:  02/09/2011 DATE OF DISCHARGE:  LH                             HISTORY & PHYSICAL   CHIEF COMPLAINT:  Microscopic hematuria.  HISTORY:  A 61 year old gentleman, referred to me by Dr. __________. The patient has __________ score of 19, also persistent microscopic hematuria.  I want to do a cystoscopy here in the office, but he could not stand the pain, so I decided to do it under anesthesia in the hospital as outpatient, for which, he is coming in the morning to have it done.  His initial workup was done in the office with cystometrics is essentially normal.  His bladder peak flow rate is 3 mL/second, average flow rate is 4.3 mL/second.  It is consistent with obstruction and residual urine was only 21 mL.  CT with and without contrast was negative.  PAST MEDICAL HISTORY:  He has significant history of coronary artery disease.  He had coronary stent placed in 2003, then in 2005, he underwent open heart surgery, then stent again in 2006.  Only medicine he is taking is aspirin.  FAMILY HISTORY:  No history of prostate cancer.  PERSONAL HISTORY:  He is married.  Lives with his wife.  He does not smoke or drink.  REVIEW OF SYSTEMS:  Unremarkable.  PHYSICAL EXAMINATION:  GENERAL:  Moderately built male, not in acute distress. VITAL SIGNS:  Blood pressure 126/72, temperature is 98.1. CENTRAL NERVOUS SYSTEM:  No gross neurological deficit. HEAD, NECK, EYES, ENT:  Negative. CHEST:  Symmetrical.  Normal breath sounds. HEART:  Regular sinus rhythm.  No murmur. ABDOMEN:  Soft, flat.  Liver, spleen, and kidneys are not palpable.  No CVA tenderness. EXTERNAL GENITALIA:  Uncircumcised.  Testicles are normal. RECTAL:  Prostate 1.5+ smooth and firm.  No rectal  mass.  IMPRESSION: 1. Microscopic hematuria. 2. Probably benign prostatic hypertrophy. 3. Coronary artery disease.  PLAN:  Cystoscopy under anesthesia as outpatient.     Ky Barban, M.D.     MIJ/MEDQ  D:  02/08/2011  T:  02/09/2011  Job:  [id number]  cc:   Dr. __________

## 2011-02-10 ENCOUNTER — Encounter: Payer: Self-pay | Admitting: Gastroenterology

## 2011-02-10 ENCOUNTER — Ambulatory Visit (INDEPENDENT_AMBULATORY_CARE_PROVIDER_SITE_OTHER): Payer: Medicare HMO | Admitting: Gastroenterology

## 2011-02-10 DIAGNOSIS — R933 Abnormal findings on diagnostic imaging of other parts of digestive tract: Secondary | ICD-10-CM

## 2011-02-10 DIAGNOSIS — R1310 Dysphagia, unspecified: Secondary | ICD-10-CM

## 2011-02-10 MED ORDER — GLYCOPYRROLATE 2 MG PO TABS: ORAL_TABLET | ORAL | Status: DC

## 2011-02-10 NOTE — Op Note (Signed)
NAME:  Gregory Friedman, Gregory Friedman NO.:  [id number]  MEDICAL RECORD NO.:  [id number]  LOCATION:  [facility]                          FACILITY:  [facility]  PHYSICIAN:  Ky Barban, M.D.DATE OF BIRTH:  1950/06/19  DATE OF PROCEDURE:  02/09/2011 DATE OF DISCHARGE:  02/09/2011                              OPERATIVE REPORT   PREOPERATIVE DIAGNOSIS:  Benign prostatic hypertrophy.  POSTOPERATIVE DIAGNOSIS:  Benign prostatic hypertrophy.  PROCEDURE:  Cystoscopy.  ANESTHESIA:  General.  PROCEDURE IN DETAIL:  The patient under general endotracheal anesthesia in lithotomy position, usual prep and drape, #25 cystoscope introduced under direct vision.  Anterior urethra looks normal.  Prostatic urethra was obstructed, it was not completely median lobe, there was a lobe which was coming from 7 o'clock position causing bladder neck obstruction.  Bladder was 2 plus trabeculated.  No tumor, stone, foreign body, or inflammation.  Cystoscope was removed.  Then, I did a rectal exam and prostate was 2 plus smooth and firm.  I can feel that lobe on the right side next to the bladder neck.  All the instruments were removed.  The patient left the operating room in satisfactory condition.     Ky Barban, M.D.     MIJ/MEDQ  D:  02/09/2011  T:  02/10/2011  Job:  [id number]

## 2011-02-10 NOTE — Assessment & Plan Note (Addendum)
CT  scan suggests inflammatory changes of the terminal ileum. Given chronicity of symptoms I suspect that he may have inflammatory bowel disease.  Operations #1 colonoscopy with the intention of intubating the terminal ileum #2 robinul forte for pain

## 2011-02-10 NOTE — Patient Instructions (Signed)
Your Colonoscopy/Endoscopy is scheduled on 02/16/2011 at 9:30am  Separate instructions have been given We have given you a SuPrep sample  Colonoscopy A colonoscopy is an exam to evaluate your entire colon. In this exam, your colon is cleansed. A long fiberoptic tube is inserted through your rectum and into your colon. The fiberoptic scope (endoscope) is a long bundle of enclosed and very flexible fibers. These fibers transmit light to the area examined and send images from that area to your caregiver. Discomfort is usually minimal. You may be given a drug to help you sleep (sedative) during or prior to the procedure. This exam helps to detect lumps (tumors), polyps, inflammation, and areas of bleeding. Your caregiver may also take a small piece of tissue (biopsy) that will be examined under a microscope. LET YOUR CAREGIVER KNOW ABOUT:   Allergies to food or medicine.   Medicines taken, including vitamins, herbs, eyedrops, over-the-counter medicines, and creams.   Use of steroids (by mouth or creams).   Previous problems with anesthetics or numbing medicines.   History of bleeding problems or blood clots.   Previous surgery.   Other health problems, including diabetes and kidney problems.   Possibility of pregnancy, if this applies.  BEFORE THE PROCEDURE   A clear liquid diet may be required for 2 days before the exam.   Ask your caregiver about changing or stopping your regular medications.   Liquid injections (enemas) or laxatives may be required.   A large amount of electrolyte solution may be given to you to drink over a short period of time. This solution is used to clean out your colon.   You should be present 60 minutes prior to your procedure or as directed by your caregiver.  AFTER THE PROCEDURE   If you received a sedative or pain relieving medication, you will need to arrange for someone to drive you home.   Occasionally, there is a little blood passed with the first  bowel movement. Do not be concerned.  FINDING OUT THE RESULTS OF YOUR TEST Not all test results are available during your visit. If your test results are not back during the visit, make an appointment with your caregiver to find out the results. Do not assume everything is normal if you have not heard from your caregiver or the medical facility. It is important for you to follow up on all of your test results. HOME CARE INSTRUCTIONS   It is not unusual to pass moderate amounts of gas and experience mild abdominal cramping following the procedure. This is due to air being used to inflate your colon during the exam. Walking or a warm pack on your belly (abdomen) may help.   You may resume all normal meals and activities after sedatives and medicines have worn off.   Only take over-the-counter or prescription medicines for pain, discomfort, or fever as directed by your caregiver. Do not use aspirin or blood thinners if a biopsy was taken. Consult your caregiver for medicine usage if biopsies were taken.  SEEK IMMEDIATE MEDICAL CARE IF:   You have a fever.   You pass large blood clots or fill a toilet with blood following the procedure. This may also occur 10 to 14 days following the procedure. This is more likely if a biopsy was taken.   You develop abdominal pain that keeps getting worse and cannot be relieved with medicine.  Document Released: 01/07/2000 Document Revised: 09/21/2010 Document Reviewed: 08/22/2007 [facility] Patient Information 2012 [place], [place].

## 2011-02-10 NOTE — Progress Notes (Signed)
History of Present Illness: [name] is a pleasant 61 year old Hispanic male with history of coronary artery disease, diabetes, hypertension referred at the request of Dr. Lynelle Doctor for evaluation of stomach pain, diarrhea, hiccups and dysphagia.  Diarrhea has been an ongoing problem for several months. He typically may have 3-4 bowel movements a day. Diarrhea is accompanied by urgency and cramping pain. He's been suffering from chronic aching lower abdominal pain as well. He denies rectal bleeding or melena. CT scan in December, 2012, which I reviewed, demonstrated wall thickening with surrounding inflammatory changes involving the distal ileum. Associated enlarged right mesenteric lymph nodes were also seen.  The patient also complains of hiccups that may occur while he is eating. When this does occur he often develops dysphagia to solids. He takes Prilosec for pyrosis.    Past Medical History  Diagnosis Date  . CAD (coronary artery disease)   . Dyslipidemia   . DM (diabetes mellitus)   . GERD (gastroesophageal reflux disease)   . Hypertension   . Hyperlipidemia   . Diabetes mellitus, type 2   . Microscopic hematuria     referred to urology 2012  . Myocardial infarction 2003  . BPH (benign prostatic hyperplasia)   . Frequency   . Arthritis   . Enlarged prostate    Past Surgical History  Procedure Date  . Fracture surgery     RT TALAR REPAIR  . Cardiac catheterization 05/2010  . Cardiovascular stress test 07/2009  . Coronary angioplasty     2 stents in heart  . Coronary artery bypass graft     2 vessels   family history includes Diabetes in his brother and Stroke in his sister.  There is no history of Anesthesia problems, and Hypotension, and Malignant hyperthermia, and Pseudochol deficiency, and Colon cancer, . Current Outpatient Prescriptions  Medication Sig Dispense Refill  . ALOE PO Take 1 tablet by mouth daily.        [name] aspirin 81 MG tablet Take 81 mg by mouth daily.         . enalapril (VASOTEC) 10 MG tablet Take 1 tablet (10 mg total) by mouth daily.  90 tablet  3  . fish oil-omega-3 fatty acids 1000 MG capsule Take 4 capsules daily (either all at once, or can take 2 capsules twice daily)      . isosorbide mononitrate (IMDUR) 30 MG 24 hr tablet Take 30 mg by mouth as needed.       . loratadine (CLARITIN) 10 MG tablet Take 10 mg by mouth daily.      . metoprolol succinate (TOPROL-XL) 25 MG 24 hr tablet Take 1 tablet (25 mg total) by mouth daily.  90 tablet  3  . naproxen sodium (ANAPROX) 220 MG tablet Take 440 mg by mouth daily.        . nitroGLYCERIN (NITROSTAT) 0.4 MG SL tablet Place 0.4 mg under the tongue every 5 (five) minutes as needed. Chest pain      . omeprazole (PRILOSEC) 40 MG capsule Take 1 capsule (40 mg total) by mouth daily.  90 capsule  3  . Saw Palmetto 450 MG CAPS Take 2 capsules by mouth daily.        . Saxagliptin-Metformin (KOMBIGLYZE XR) 05-998 MG TB24 Take 1 tablet by mouth daily.  28 tablet  0  . simvastatin (ZOCOR) 40 MG tablet Take 1 tablet (40 mg total) by mouth at bedtime.  90 tablet  3  . Tamsulosin HCl (FLOMAX) 0.4 MG CAPS  Take 1 capsule (0.4 mg total) by mouth daily.  30 capsule  0   No current facility-administered medications for this visit.   Facility-Administered Medications Ordered in Other Visits  Medication Dose Route Frequency Provider Last Rate Last Dose  . DISCONTD: fentaNYL (SUBLIMAZE) 0.05 MG/ML injection           . DISCONTD: fentaNYL (SUBLIMAZE) injection 25-50 mcg  25-50 mcg Intravenous Q5 min PRN Laurene Footman, MD      . DISCONTD: lactated ringers infusion   Intravenous Continuous Laurene Footman, MD 75 mL/hr at 02/09/11 1021 1,000 mL at 02/09/11 1021  . DISCONTD: lidocaine (XYLOCAINE) 1 % injection           . DISCONTD: midazolam (VERSED) 2 MG/2ML injection           . DISCONTD: midazolam (VERSED) injection 1-2 mg  1-2 mg Intravenous Q5 Min x 3 PRN Laurene Footman, MD   2 mg at 02/09/11 1023  . DISCONTD:  ondansetron (ZOFRAN) injection 4 mg  4 mg Intravenous Once PRN Laurene Footman, MD      . DISCONTD: propofol (DIPRIVAN) 10 MG/ML infusion            Allergies as of 02/10/2011  . (No Known Allergies)    reports that he has never smoked. He has never used smokeless tobacco. He reports that he does not drink alcohol or use illicit drugs.     Review of Systems:  He often feels depressed. Pertinent positive and negative review of systems were noted in the above HPI section. All other review of systems were otherwise negative.  Vital signs were reviewed in today's medical record Physical Exam: General: Well developed , well nourished, no acute distress Head: Normocephalic and atraumatic Eyes:  sclerae anicteric, EOMI Ears: Normal auditory acuity Mouth: No deformity or lesions Neck: Supple, no masses or thyromegaly Lungs: Clear throughout to auscultation Heart: Regular rate and rhythm; no murmurs, rubs or bruits Abdomen: Soft, non tender and non distended. No masses, hepatosplenomegaly or hernias noted. Normal Bowel sounds Rectal:deferred Musculoskeletal: Symmetrical with no gross deformities  Skin: No lesions on visible extremities Pulses:  Normal pulses noted Extremities: No clubbing, cyanosis, edema or deformities noted Neurological: Alert oriented x 4, grossly nonfocal Cervical Nodes:  No significant cervical adenopathy Inguinal Nodes: No significant inguinal adenopathy Psychological:  Alert and cooperative. Normal mood and affect

## 2011-02-10 NOTE — Assessment & Plan Note (Signed)
Rule out peptic esophageal stricture  Recommendations #1 upper endoscopy with dilatation as indicated  Risks, alternatives, and complications of the procedure, including bleeding, perforation, and possible need for surgery, were explained to the patient.  Patient's questions were answered.

## 2011-02-13 ENCOUNTER — Encounter (HOSPITAL_COMMUNITY): Payer: Self-pay | Admitting: Urology

## 2011-02-16 ENCOUNTER — Encounter: Payer: Self-pay | Admitting: Gastroenterology

## 2011-02-16 ENCOUNTER — Ambulatory Visit (AMBULATORY_SURGERY_CENTER): Payer: Medicare HMO | Admitting: Gastroenterology

## 2011-02-16 DIAGNOSIS — R1310 Dysphagia, unspecified: Secondary | ICD-10-CM

## 2011-02-16 DIAGNOSIS — D126 Benign neoplasm of colon, unspecified: Secondary | ICD-10-CM

## 2011-02-16 DIAGNOSIS — D131 Benign neoplasm of stomach: Secondary | ICD-10-CM

## 2011-02-16 DIAGNOSIS — R933 Abnormal findings on diagnostic imaging of other parts of digestive tract: Secondary | ICD-10-CM

## 2011-02-16 DIAGNOSIS — K222 Esophageal obstruction: Secondary | ICD-10-CM

## 2011-02-16 DIAGNOSIS — R197 Diarrhea, unspecified: Secondary | ICD-10-CM

## 2011-02-16 DIAGNOSIS — Z8719 Personal history of other diseases of the digestive system: Secondary | ICD-10-CM

## 2011-02-16 HISTORY — PX: COLONOSCOPY WITH PROPOFOL: SHX5780

## 2011-02-16 HISTORY — DX: Personal history of other diseases of the digestive system: Z87.19

## 2011-02-16 HISTORY — PX: ESOPHAGOGASTRODUODENOSCOPY (EGD) WITH ESOPHAGEAL DILATION: SHX5812

## 2011-02-16 LAB — GLUCOSE, CAPILLARY
Glucose-Capillary: 163 mg/dL — ABNORMAL HIGH (ref 70–99)
Glucose-Capillary: 120 mg/dL — ABNORMAL HIGH (ref 70–99)

## 2011-02-16 LAB — HM COLONOSCOPY

## 2011-02-16 MED ORDER — SODIUM CHLORIDE 0.9 % IV SOLN: 500.0000 mL | INTRAVENOUS | Status: DC

## 2011-02-16 NOTE — Progress Notes (Signed)
Patient did not have preoperative order for IV antibiotic SSI prophylaxis. (G8918)  Patient did not experience any of the following events: a burn prior to discharge; a fall within the facility; wrong site/side/patient/procedure/implant event; or a hospital transfer or hospital admission upon discharge from the facility. (G8907)  

## 2011-02-16 NOTE — Op Note (Signed)
[facility] [address] [place], [place]  [address]  ENDOSCOPY PROCEDURE REPORT  PATIENT:  Gregory Friedman, Gregory Friedman  MR#:  [id number] BIRTHDATE:  1950/07/06, 60 yrs. old  GENDER:  male  ENDOSCOPIST:  Barbette Hair. Arlyce Dice, MD Referred by:  Joselyn Arrow, M.D.  PROCEDURE DATE:  02/16/2011 PROCEDURE:  EGD with biopsy, 43239, Maloney Dilation of Esophagus ASA CLASS:  Class II INDICATIONS:  dysphagia  MEDICATIONS:   There was residual sedation effect present from prior procedure., MAC sedation, administered by CRNA propofol 50mg IV, glycopyrrolate (Robinal) 0.2 mg IV TOPICAL ANESTHETIC:  DESCRIPTION OF PROCEDURE:   After the risks and benefits of the procedure were explained, informed consent was obtained.  The LB [id number] [id number] endoscope was introduced through the mouth and advanced to the third portion of the duodenum.  The instrument was slowly withdrawn as the mucosa was fully examined. <<PROCEDUREIMAGES>>  A nodule was found in the cardia. 5mm nodule with slightly prominent overlying mucosa. Bxs taken (see image3).  A stricture was found at the gastroesophageal junction (see image4). Early stricture Dilation with maloney dilator 18mm Minimal resistance; no heme  Otherwise the examination was normal (see image1 and image2).    Retroflexed views revealed no abnormalities.    The scope was then withdrawn from the patient and the procedure completed.  COMPLICATIONS:  None  ENDOSCOPIC IMPRESSION: 1) Nodule in the cardia 2) Stricture at the gastroesophageal junction 3) Otherwise normal examination RECOMMENDATIONS: 1) continue current medications 2) Call office next 2-3 days to schedule an office appointment for 3 weeks  ______________________________ Barbette Hair. Arlyce Dice, MD  CC:  n. eSIGNED:   Barbette Hair. Shiryl Ruddy at 02/16/2011 10:35 AM  [name], Onalee Hua, [phone]

## 2011-02-16 NOTE — Patient Instructions (Signed)
Read the handouts given to you by your recovery room nurse.   Your biopsy results will be mailed to your home within two weeks.  You may resume all of your routine medications today.   If you have any questions, please call us at [phone].

## 2011-02-16 NOTE — Op Note (Signed)
[facility] [address] [place], [place]  [address]  COLONOSCOPY PROCEDURE REPORT  PATIENT:  Gregory Friedman, Gregory Friedman  MR#:  [id number] BIRTHDATE:  [birth year], 60 yrs. old  GENDER:  male ENDOSCOPIST:  Barbette Hair. Arlyce Dice, MD REF. BY:  Joselyn Arrow, M.D. PROCEDURE DATE:  02/16/2011 PROCEDURE:  Colonoscopy with biopsy, Colonoscopy with snare polypectomy ASA CLASS:  Class II INDICATIONS:  unexplained diarrhea, Abnormal CT of abdomen Thickening of TI by CT MEDICATIONS:   MAC sedation, administered by CRNA propofol 200mg IV  DESCRIPTION OF PROCEDURE:   After the risks benefits and alternatives of the procedure were thoroughly explained, informed consent was obtained.  Digital rectal exam was performed and revealed no abnormalities.   The LB 180AL [id number] endoscope was introduced through the anus and advanced to the ileum (at least 12 inches), without limitations.  The quality of the prep was excellent, using MoviPrep.  The instrument was then slowly withdrawn as the colon was fully examined. <<PROCEDUREIMAGES>>  FINDINGS:  The terminal ileum appeared normal (see image5, image6, and image7).  A sessile polyp was found in the descending colon. It was 1 cm in size. Polyps were snared without cautery. Retrieval was successful (see image10). snare polyp  Melanosis coli was found (see image8).  This was otherwise a normal examination of the colon (see image4 and image11). Random biopsies were taken to r/o microscopic colitis   Retroflexed views in the rectum revealed no abnormalities.    The time to cecum =  1) 3.25  minutes. The scope was then withdrawn in  1) 13.0  minutes from the cecum and the procedure completed. COMPLICATIONS:  None ENDOSCOPIC IMPRESSION: 1) Normal terminal ileum 2) 1 cm sessile polyp in the descending colon 3) Melanosis 4) Otherwise normal examination RECOMMENDATIONS: 1) If the polyp(s) removed today are proven to be adenomatous (pre-cancerous) polyps, you  will need a repeat colonoscopy in 5 years. Otherwise you should continue to follow colorectal cancer screening guidelines for "routine risk" patients with colonoscopy in 10 years. 2) Await biopsy results 3) Out patient follow-up in 2-3 weeks. REPEAT EXAM:   You will receive a letter from Dr. Arlyce Dice in 1-2 weeks, after reviewing the final pathology, with followup recommendations.  ______________________________ Barbette Hair Arlyce Dice, MD  CC:  n. eSIGNED:   Barbette Hair. Kaplan at 02/16/2011 10:31 AM  [name], Onalee Hua, [phone]

## 2011-02-17 ENCOUNTER — Telehealth: Payer: Self-pay | Admitting: *Deleted

## 2011-02-17 NOTE — Telephone Encounter (Signed)
CALLED TELEPHONE NUMBER PROVIDED BY THE PATIENT IN ADMITTING, NO ANSWER MESSAGE LEFT.

## 2011-02-21 ENCOUNTER — Encounter: Payer: Self-pay | Admitting: *Deleted

## 2011-02-21 ENCOUNTER — Encounter: Payer: Self-pay | Admitting: Gastroenterology

## 2011-02-24 ENCOUNTER — Encounter: Payer: Self-pay | Admitting: *Deleted

## 2011-03-08 ENCOUNTER — Telehealth: Payer: Self-pay | Admitting: Internal Medicine

## 2011-03-08 NOTE — Telephone Encounter (Signed)
I'm not aware of being on this med.  Looks like it was started just last month by urologist.  He needs to get rx from his urologist.

## 2011-03-09 ENCOUNTER — Telehealth: Payer: Self-pay | Admitting: *Deleted

## 2011-03-09 NOTE — Telephone Encounter (Signed)
Spoke with patient and he will call his urologist for the refill on Flomax.

## 2011-03-10 NOTE — Telephone Encounter (Signed)
done

## 2011-03-20 ENCOUNTER — Ambulatory Visit: Payer: Medicare HMO | Admitting: Gastroenterology

## 2011-05-03 ENCOUNTER — Other Ambulatory Visit: Payer: Self-pay | Admitting: Family Medicine

## 2011-05-22 ENCOUNTER — Other Ambulatory Visit: Payer: Medicare HMO

## 2011-05-22 DIAGNOSIS — E119 Type 2 diabetes mellitus without complications: Secondary | ICD-10-CM

## 2011-05-22 DIAGNOSIS — E785 Hyperlipidemia, unspecified: Secondary | ICD-10-CM

## 2011-05-22 LAB — COMPREHENSIVE METABOLIC PANEL
BUN: 11 mg/dL (ref 6–23)
CO2: 34 mEq/L — ABNORMAL HIGH (ref 19–32)
Creat: 1.17 mg/dL (ref 0.50–1.35)
Glucose, Bld: 137 mg/dL — ABNORMAL HIGH (ref 70–99)
Total Bilirubin: 0.4 mg/dL (ref 0.3–1.2)
Total Protein: 6.8 g/dL (ref 6.0–8.3)

## 2011-05-22 LAB — LIPID PANEL
Cholesterol: 139 mg/dL (ref 0–200)
HDL: 37 mg/dL — ABNORMAL LOW (ref >39)
Total CHOL/HDL Ratio: 3.8 Ratio
Triglycerides: 164 mg/dL — ABNORMAL HIGH (ref <150)

## 2011-05-22 LAB — HEMOGLOBIN A1C
Hgb A1c MFr Bld: 7.4 % — ABNORMAL HIGH (ref <5.7)
Mean Plasma Glucose: 166 mg/dL — ABNORMAL HIGH (ref <117)

## 2011-05-24 ENCOUNTER — Encounter: Payer: Self-pay | Admitting: Family Medicine

## 2011-05-24 ENCOUNTER — Ambulatory Visit (INDEPENDENT_AMBULATORY_CARE_PROVIDER_SITE_OTHER): Payer: Medicare HMO | Admitting: Family Medicine

## 2011-05-24 VITALS — BP 140/78 | HR 68 | Ht 68.0 in | Wt 199.0 lb

## 2011-05-24 DIAGNOSIS — E1165 Type 2 diabetes mellitus with hyperglycemia: Secondary | ICD-10-CM | POA: Insufficient documentation

## 2011-05-24 DIAGNOSIS — E785 Hyperlipidemia, unspecified: Secondary | ICD-10-CM

## 2011-05-24 DIAGNOSIS — M25519 Pain in unspecified shoulder: Secondary | ICD-10-CM

## 2011-05-24 DIAGNOSIS — I10 Essential (primary) hypertension: Secondary | ICD-10-CM

## 2011-05-24 DIAGNOSIS — I2510 Atherosclerotic heart disease of native coronary artery without angina pectoris: Secondary | ICD-10-CM

## 2011-05-24 DIAGNOSIS — M25511 Pain in right shoulder: Secondary | ICD-10-CM

## 2011-05-24 NOTE — Patient Instructions (Addendum)
Orders are in the system for you to go to [facility] for x-rays of your right shoulder.  Depending on results, we may ask you to return here for full evaluation and possible cortisone shot, versus referral to orthopedist.  Diabetes-- Check sugars in the evenings at least twice a week.  Schedule eye exam. Cut back on juices. Try and exercise daily--this also keeps sugars lower (and raises the HDL cholesterol).  If A1c remains >7, we will need to adjust your medications.  Try and drink more water, or SUGAR FREE drinks, not juices, regular soda, alcohol or sweet tea (which all have sugar)  Check your blood pressure at least 1-2x/week, write down on same paper with sugars, and bring this piece of paper with sugars and blood pressures to your next appointment.  Goal BP is <130/80 for someone with diabetes and heart disease.  Your BP today was 140/78.  Make sure you are following a low sodium diet   2 Gram Low Sodium Diet A 2 gram sodium diet restricts the amount of sodium in the diet to no more than 2 g or 2000 mg daily. Limiting the amount of sodium is often used to help lower blood pressure. It is important if you have heart, liver, or kidney problems. Many foods contain sodium for flavor and sometimes as a preservative. When the amount of sodium in a diet needs to be low, it is important to know what to look for when choosing foods and drinks. The following includes some information and guidelines to help make it easier for you to adapt to a low sodium diet. QUICK TIPS  Do not add salt to food.   Avoid convenience items and fast food.   Choose unsalted snack foods.   Buy lower sodium products, often labeled as "lower sodium" or "no salt added."   Check food labels to learn how much sodium is in 1 serving.   When eating at a restaurant, ask that your food be prepared with less salt or none, if possible.  READING FOOD LABELS FOR SODIUM INFORMATION The nutrition facts label is a good place  to find how much sodium is in foods. Look for products with no more than 500 to 600 mg of sodium per meal and no more than 150 mg per serving. Remember that 2 g = 2000 mg. The food label may also list foods as:  Sodium-free: Less than 5 mg in a serving.   Very low sodium: 35 mg or less in a serving.   Low-sodium: 140 mg or less in a serving.   Light in sodium: 50% less sodium in a serving. For example, if a food that usually has 300 mg of sodium is changed to become light in sodium, it will have 150 mg of sodium.   Reduced sodium: 25% less sodium in a serving. For example, if a food that usually has 400 mg of sodium is changed to reduced sodium, it will have 300 mg of sodium.  CHOOSING FOODS Grains  Avoid: Salted crackers and snack items. Some cereals, including instant hot cereals. Bread stuffing and biscuit mixes. Seasoned rice or pasta mixes.   Choose: Unsalted snack items. Low-sodium cereals, oats, puffed wheat and rice, shredded wheat. English muffins and bread. Pasta.  Meats  Avoid: Salted, canned, smoked, spiced, pickled meats, including fish and poultry. Bacon, ham, sausage, cold cuts, hot dogs, anchovies.   Choose: Low-sodium canned tuna and salmon. Fresh or frozen meat, poultry, and fish.  Dairy  Avoid: Processed cheese and spreads. Cottage cheese. Buttermilk and condensed milk. Regular cheese.   Choose: Milk. Low-sodium cottage cheese. Yogurt. Sour cream. Low-sodium cheese.  Fruits and Vegetables  Avoid: Regular canned vegetables. Regular canned tomato sauce and paste. Frozen vegetables in sauces. Olives. Rosita Fire. Relishes. Sauerkraut.   Choose: Low-sodium canned vegetables. Low-sodium tomato sauce and paste. Frozen or fresh vegetables. Fresh and frozen fruit.  Condiments  Avoid: Canned and packaged gravies. Worcestershire sauce. Tartar sauce. Barbecue sauce. Soy sauce. Steak sauce. Ketchup. Onion, garlic, and table salt. Meat flavorings and tenderizers.   Choose:  Fresh and dried herbs and spices. Low-sodium varieties of mustard and ketchup. Lemon juice. Tabasco sauce. Horseradish.  SAMPLE 2 GRAM SODIUM MEAL PLAN Breakfast / Sodium (mg)  1 cup low-fat milk / 143 mg   2 slices whole-wheat toast / 270 mg   1 tbs heart-healthy margarine / 153 mg   1 hard-boiled egg / 139 mg   1 small orange / 0 mg  Lunch / Sodium (mg)  1 cup raw carrots / 76 mg    cup hummus / 298 mg   1 cup low-fat milk / 143 mg    cup red grapes / 2 mg   1 whole-wheat pita bread / 356 mg  Dinner / Sodium (mg)  1 cup whole-wheat pasta / 2 mg   1 cup low-sodium tomato sauce / 73 mg   3 oz lean ground beef / 57 mg   1 small side salad (1 cup raw spinach leaves,  cup cucumber,  cup yellow bell pepper) with 1 tsp olive oil and 1 tsp red wine vinegar / 25 mg  Snack / Sodium (mg)  1 container low-fat vanilla yogurt / 107 mg   3 graham cracker squares / 127 mg  Nutrient Analysis  Calories: 2033   Protein: 77 g   Carbohydrate: 282 g   Fat: 72 g   Sodium: 1971 mg  Document Released: 01/09/2005 Document Revised: 12/29/2010 Document Reviewed: 04/12/2009 [facility] Patient Information 2012 [place], [place].

## 2011-05-24 NOTE — Progress Notes (Signed)
Chief complaint:  Patient presents for 6 month med check.  Also complaining of some right shoulder pain.  HPI:  Diabetes follow-up:  Blood sugars at home are running 110-125, but only checking in the mornings.  Denies hypoglycemia--had one episode that he woke up sweaty, but sugar was normal.  Denies polydipsia and polyuria.  Last eye exam was last Spring.  Patient follows a low sugar diet and checks feet regularly without concerns. Admits to still drinking some cranberry juice every day.  Hyperlipidemia follow-up:  Patient is reportedly following a low-fat, low cholesterol diet.  Compliant with medications and denies medication side effects  CAD--saw Dr. Tenny Craw in January, and was told to continue with his current regimen.  Has BP monitor at home, but hasn't been checking recently. Has been having some salted nuts as snacks.  Denies chest pain, headaches, dizziness.  R shoulder pain.  Admits to taking some of his wife's celebrex, which helped more than the Aleve he was taking.  Seems to have gotten worse since he stopped the neurontin for his feet.  It pops, and sometimes keeps him up at night.  Recently joined gym, starting next week  Past Medical History  Diagnosis Date  . CAD (coronary artery disease)   . Dyslipidemia   . DM (diabetes mellitus)   . GERD (gastroesophageal reflux disease)   . Hypertension   . Hyperlipidemia   . Diabetes mellitus, type 2   . Microscopic hematuria     referred to urology 2012  . Myocardial infarction 2003  . BPH (benign prostatic hyperplasia)   . Frequency   . Arthritis   . Enlarged prostate   . Anxiety   . Depression     Past Surgical History  Procedure Date  . Fracture surgery     RT TALAR REPAIR  . Cardiac catheterization 05/2010  . Cardiovascular stress test 07/2009  . Coronary angioplasty     2 stents in heart  . Coronary artery bypass graft     2 vessels  . Cystoscopy 02/09/2011    Procedure: CYSTOSCOPY;  Surgeon: Ky Barban,  MD;  Location: [facility];  Service: Urology;  Laterality: N/A;    History   Social History  . Marital Status: Married    Spouse Name: N/A    Number of Children: 3  . Years of Education: N/A   Occupational History  . diasabiled    Social History Main Topics  . Smoking status: Never Smoker   . Smokeless tobacco: Never Used  . Alcohol Use: No  . Drug Use: No  . Sexually Active: Not on file   Other Topics Concern  . Not on file   Social History Narrative  . No narrative on file    Family History  Problem Relation Age of Onset  . Stroke Sister   . Anesthesia problems Neg Hx   . Hypotension Neg Hx   . Malignant hyperthermia Neg Hx   . Pseudochol deficiency Neg Hx   . Colon cancer Neg Hx   . Diabetes Brother    Current Outpatient Prescriptions on File Prior to Visit  Medication Sig Dispense Refill  . ALOE PO Take 1 tablet by mouth daily.        [name] aspirin 81 MG tablet Take 81 mg by mouth daily.        . enalapril (VASOTEC) 10 MG tablet Take 1 tablet (10 mg total) by mouth daily.  90 tablet  3  . fish oil-omega-3 fatty acids  1000 MG capsule Take 4 capsules daily (either all at once, or can take 2 capsules twice daily)      . isosorbide mononitrate (IMDUR) 30 MG 24 hr tablet Take 30 mg by mouth as needed.       . metoprolol succinate (TOPROL-XL) 25 MG 24 hr tablet Take 1 tablet (25 mg total) by mouth daily.  90 tablet  3  . naproxen sodium (ANAPROX) 220 MG tablet Take 440 mg by mouth daily.        . nitroGLYCERIN (NITROSTAT) 0.4 MG SL tablet Place 0.4 mg under the tongue every 5 (five) minutes as needed. Chest pain      . omeprazole (PRILOSEC) 40 MG capsule TAKE 1 CAPSULE DAILY  90 capsule  0  . Saxagliptin-Metformin (KOMBIGLYZE XR) 05-998 MG TB24 Take 1 tablet by mouth daily.  28 tablet  0  . simvastatin (ZOCOR) 40 MG tablet TAKE 1 TABLET AT BEDTIME  90 tablet  0  . Tamsulosin HCl (FLOMAX) 0.4 MG CAPS Take 1 capsule (0.4 mg total) by mouth daily.  30 capsule  0  . loratadine  (CLARITIN) 10 MG tablet Take 10 mg by mouth daily.       No Known Allergies  ROS:  Denies fevers, URI symptoms, headaches, dizziness, shortness of breath, GI complaints, GU complaints, skin lesions.  +R shoulder pain, and some other intermittent pains  PHYSICAL EXAM: BP 140/78  Pulse 68  Ht 5\' 8"  (1.727 m)  Wt 199 lb (90.266 kg)  BMI 30.26 kg/m2 Well developed, pleasant male in no distress HEENT:  Conjunctiva clear, OP clear NECK: no lymphadenopathy, thyromegaly, carotid bruit Heart: regular rate and rhythm Lungs: clear bilaterally Abdomen: soft, nontender, no organomegaly or mass Extremities: no clubbing, cyanosis or edema. 2+ pulses R shoulder: Pain and decreased ROM with internal rotation.  Otherwise FROM without pain.  Tender anterior shoulder. nontender at [facility] joint. No impingement.   Neuro: alert and oriented.  Cranial nerves grossly intact.  Normal strength, sensation, gait Skin: no lesions  Normal diabetic foot exam.  Lab Results  Component Value Date   CHOL 139 05/22/2011   HDL 37* 05/22/2011   LDLCALC 69 05/22/2011   TRIG 164* 05/22/2011   CHOLHDL 3.8 05/22/2011   Lab Results  Component Value Date   HGBA1C 7.4* 05/22/2011     Chemistry      Component Value Date/Time   NA 142 05/22/2011 0859   K 4.5 05/22/2011 0859   CL 103 05/22/2011 0859   CO2 34* 05/22/2011 0859   BUN 11 05/22/2011 0859   CREATININE 1.17 05/22/2011 0859   CREATININE 1.16 02/03/2011 1430      Component Value Date/Time   CALCIUM 9.5 05/22/2011 0859   ALKPHOS 53 05/22/2011 0859   AST 45* 05/22/2011 0859   ALT 38 05/22/2011 0859   BILITOT 0.4 05/22/2011 0859     ASSESSMENT/PLAN: 1. Type II or unspecified type diabetes mellitus without mention of complication, uncontrolled    2. HYPERLIPIDEMIA-MIXED    3. HYPERTENSION, BENIGN    4. Right shoulder pain  DG Shoulder Right  5. CAD     DM--suboptimally controlled.  Check sugars in the evenings at least twice a week.  Schedule eye exam. Cut back on  juices.  Patient prefers to cut back on juices rather than increasing amount of metformin.  If A1c remains >7, will need to change meds (he is hesitant to increase metformin due to increase in bowel movements).  HTN--BP above goal.  Start checking at home at least 1-2x/weekly and record.  Bring list of sugars and blood pressure to follow-up. Low sodium diet reviewed.  Hyperlipidemia--continue current medication (statin and fish oil).  Increasing exercise as he plans will also help  Shoulder pain--x-ray, and f/u here to discuss treatment (ie consider cortisone shot, vs referral to ortho)  F/u in 3 months--A1c at visit, and also re-check LFT

## 2011-05-26 ENCOUNTER — Ambulatory Visit
Admission: RE | Admit: 2011-05-26 | Discharge: 2011-05-26 | Disposition: A | Discharge status: Home / Self Care | Payer: Medicare HMO | Source: Ambulatory Visit | Attending: Family Medicine | Admitting: Family Medicine

## 2011-05-26 DIAGNOSIS — M25511 Pain in right shoulder: Secondary | ICD-10-CM

## 2011-05-29 ENCOUNTER — Telehealth: Payer: Self-pay | Admitting: *Deleted

## 2011-05-29 NOTE — Telephone Encounter (Signed)
Error

## 2011-07-25 ENCOUNTER — Other Ambulatory Visit: Payer: Self-pay | Admitting: Family Medicine

## 2011-08-07 ENCOUNTER — Telehealth: Payer: Self-pay | Admitting: Family Medicine

## 2011-08-07 NOTE — Telephone Encounter (Signed)
Spoke with patient and let him know that I called [facility] and they verified that they shipped out his meds 07/25/2011.

## 2011-08-24 ENCOUNTER — Ambulatory Visit (INDEPENDENT_AMBULATORY_CARE_PROVIDER_SITE_OTHER): Payer: Medicare HMO | Admitting: Family Medicine

## 2011-08-24 ENCOUNTER — Encounter: Payer: Self-pay | Admitting: Family Medicine

## 2011-08-24 VITALS — BP 138/84 | HR 64 | Ht 68.0 in | Wt 193.0 lb

## 2011-08-24 DIAGNOSIS — I10 Essential (primary) hypertension: Secondary | ICD-10-CM

## 2011-08-24 DIAGNOSIS — R945 Abnormal results of liver function studies: Secondary | ICD-10-CM

## 2011-08-24 DIAGNOSIS — E785 Hyperlipidemia, unspecified: Secondary | ICD-10-CM

## 2011-08-24 DIAGNOSIS — E119 Type 2 diabetes mellitus without complications: Secondary | ICD-10-CM

## 2011-08-24 DIAGNOSIS — R7989 Other specified abnormal findings of blood chemistry: Secondary | ICD-10-CM

## 2011-08-24 LAB — HEPATIC FUNCTION PANEL
Bilirubin, Direct: 0.2 mg/dL (ref 0.0–0.3)
Indirect Bilirubin: 0.6 mg/dL (ref 0.0–0.9)
Total Bilirubin: 0.8 mg/dL (ref 0.3–1.2)
Total Protein: 7.3 g/dL (ref 6.0–8.3)

## 2011-08-24 LAB — POCT GLYCOSYLATED HEMOGLOBIN (HGB A1C): Hemoglobin A1C: 6.4

## 2011-08-24 MED ORDER — SAXAGLIPTIN-METFORMIN ER 5-1000 MG PO TB24: 1.0000 | ORAL_TABLET | Freq: Every day | ORAL | Status: DC

## 2011-08-24 NOTE — Patient Instructions (Signed)
Your sugar is much better.  Keep up the good work--daily exercise and weight loss, and continue same medications.  Call when refills are needed.

## 2011-08-24 NOTE — Progress Notes (Signed)
Chief Complaint  Patient presents with  . Follow-up    3 month follow-up on diabetes and bp. pt is taking urinozinc. pt would like samples of kombiglyze   HPI:  Patient presents for 3 month f/u on DM and hypertension.  His A1c in May was 7.4.  He had declined med changes, and preferred dietary changes, cutting back on juice.  His BP at last visit was elevated, and he was asked to monitor elsewhere and bring list of BP's and sugars to visit today.  Sugars at home are running 102-107 in the mornings.  Checked a few times at bedtime and was 170-180.  Denies hypoglycemia, denies polydipsia.  Drinking more water, no juices.    BP's at home are running 124-139/67-80. Denies headaches, dizziness, chest pain, headaches, edema  R shoulder pain evaluated at last visit.  X-rays were normal.  His shoulder is much better, using glucosamine and hyaluronic acid.  He is walking at least 3 times/week for the last 3 weeks, foot pain is better, and overall feels great. Wife reports that his mood and energy is much better.  Past Medical History  Diagnosis Date  . CAD (coronary artery disease)   . Dyslipidemia   . DM (diabetes mellitus)   . GERD (gastroesophageal reflux disease)   . Hypertension   . Hyperlipidemia   . Diabetes mellitus, type 2   . Microscopic hematuria     referred to urology 2012  . Myocardial infarction 2003  . BPH (benign prostatic hyperplasia)   . Frequency   . Arthritis   . Enlarged prostate   . Anxiety   . Depression    Past Surgical History  Procedure Date  . Fracture surgery     RT TALAR REPAIR  . Cardiac catheterization 05/2010  . Cardiovascular stress test 07/2009  . Coronary angioplasty     2 stents in heart  . Coronary artery bypass graft     2 vessels  . Cystoscopy 02/09/2011    Procedure: CYSTOSCOPY;  Surgeon: Ky Barban, MD;  Location: [facility];  Service: Urology;  Laterality: N/A;   History   Social History  . Marital Status: Married    Spouse Name:  N/A    Number of Children: 3  . Years of Education: N/A   Occupational History  . diasabiled    Social History Main Topics  . Smoking status: Never Smoker   . Smokeless tobacco: Never Used  . Alcohol Use: No  . Drug Use: No  . Sexually Active: Not on file   Other Topics Concern  . Not on file   Social History Narrative  . No narrative on file   Current Outpatient Prescriptions on File Prior to Visit  Medication Sig Dispense Refill  . ALOE PO Take 1 tablet by mouth daily.        [name] aspirin 81 MG tablet Take 81 mg by mouth daily.        . enalapril (VASOTEC) 10 MG tablet TAKE 1 TABLET DAILY  90 tablet  1  . fish oil-omega-3 fatty acids 1000 MG capsule Take 4 capsules daily (either all at once, or can take 2 capsules twice daily)      . isosorbide mononitrate (IMDUR) 30 MG 24 hr tablet Take 30 mg by mouth as needed.       . metoprolol succinate (TOPROL-XL) 25 MG 24 hr tablet TAKE 1 TABLET DAILY  90 tablet  1  . omeprazole (PRILOSEC) 40 MG capsule TAKE 1  CAPSULE DAILY  90 capsule  0  . simvastatin (ZOCOR) 40 MG tablet TAKE 1 TABLET AT BEDTIME  90 tablet  0  . loratadine (CLARITIN) 10 MG tablet Take 10 mg by mouth daily.      . naproxen sodium (ANAPROX) 220 MG tablet Take 440 mg by mouth daily.        . nitroGLYCERIN (NITROSTAT) 0.4 MG SL tablet Place 0.4 mg under the tongue every 5 (five) minutes as needed. Chest pain      . Tamsulosin HCl (FLOMAX) 0.4 MG CAPS Take 1 capsule (0.4 mg total) by mouth daily.  30 capsule  0  . DISCONTD: Saxagliptin-Metformin (KOMBIGLYZE XR) 05-998 MG TB24 Take 1 tablet by mouth daily.  28 tablet  0   No Known Allergies  ROS:  Denies fevers, URI symptoms, chest pain, shortness of breath, edema joint pains, GI complaints, skin complaints, depression or other concerns.  PHYSICAL EXAM: BP 138/84  Pulse 64  Ht 5\' 8"  (1.727 m)  Wt 193 lb (87.544 kg)  BMI 29.35 kg/m2 Pleasant male, in no distress Exam limited to discussion/counseling regarding diet,  exercise, sugars and BP  Lab Results  Component Value Date   HGBA1C 6.4 08/24/2011   ASSESSMENT/PLAN: 1. HYPERTENSION, BENIGN    2. Type II or unspecified type diabetes mellitus without mention of complication, not stated as uncontrolled  POCT HgB A1C, Saxagliptin-Metformin (KOMBIGLYZE XR) 05-998 MG TB24  3. Abnormal liver function test  Hepatic function panel    F/u 3 months with fasting labs prior

## 2011-08-25 ENCOUNTER — Encounter: Payer: Self-pay | Admitting: Family Medicine

## 2011-09-22 ENCOUNTER — Other Ambulatory Visit: Payer: Self-pay | Admitting: Family Medicine

## 2011-09-27 ENCOUNTER — Other Ambulatory Visit: Payer: Self-pay | Admitting: *Deleted

## 2011-09-27 DIAGNOSIS — E119 Type 2 diabetes mellitus without complications: Secondary | ICD-10-CM

## 2011-09-27 MED ORDER — SAXAGLIPTIN-METFORMIN ER 5-1000 MG PO TB24: 1.0000 | ORAL_TABLET | Freq: Every day | ORAL | Status: DC

## 2011-11-02 ENCOUNTER — Telehealth: Payer: Self-pay | Admitting: Family Medicine

## 2011-11-02 DIAGNOSIS — E119 Type 2 diabetes mellitus without complications: Secondary | ICD-10-CM

## 2011-11-02 DIAGNOSIS — E782 Mixed hyperlipidemia: Secondary | ICD-10-CM

## 2011-11-02 DIAGNOSIS — K219 Gastro-esophageal reflux disease without esophagitis: Secondary | ICD-10-CM

## 2011-11-02 DIAGNOSIS — I10 Essential (primary) hypertension: Secondary | ICD-10-CM

## 2011-11-02 MED ORDER — ENALAPRIL MALEATE 10 MG PO TABS: 10.0000 mg | ORAL_TABLET | Freq: Every day | ORAL | Status: DC

## 2011-11-02 MED ORDER — OMEPRAZOLE 40 MG PO CPDR: 40.0000 mg | DELAYED_RELEASE_CAPSULE | Freq: Every day | ORAL | Status: DC

## 2011-11-02 MED ORDER — SAXAGLIPTIN-METFORMIN ER 5-1000 MG PO TB24: 1.0000 | ORAL_TABLET | Freq: Every day | ORAL | Status: DC

## 2011-11-02 MED ORDER — METOPROLOL SUCCINATE ER 25 MG PO TB24: 25.0000 mg | ORAL_TABLET | Freq: Every day | ORAL | Status: DC

## 2011-11-02 MED ORDER — SIMVASTATIN 40 MG PO TABS: 40.0000 mg | ORAL_TABLET | Freq: Every day | ORAL | Status: DC

## 2011-11-02 NOTE — Telephone Encounter (Signed)
Done

## 2011-11-06 ENCOUNTER — Other Ambulatory Visit: Payer: Self-pay | Admitting: *Deleted

## 2011-11-07 ENCOUNTER — Encounter: Payer: Self-pay | Admitting: Internal Medicine

## 2011-11-07 LAB — HM DIABETES EYE EXAM

## 2011-12-04 ENCOUNTER — Other Ambulatory Visit: Payer: Medicare HMO

## 2011-12-04 DIAGNOSIS — I10 Essential (primary) hypertension: Secondary | ICD-10-CM

## 2011-12-04 DIAGNOSIS — E785 Hyperlipidemia, unspecified: Secondary | ICD-10-CM

## 2011-12-04 DIAGNOSIS — E119 Type 2 diabetes mellitus without complications: Secondary | ICD-10-CM

## 2011-12-04 LAB — LIPID PANEL
Cholesterol: 144 mg/dL (ref 0–200)
VLDL: 25 mg/dL (ref 0–40)

## 2011-12-04 LAB — COMPREHENSIVE METABOLIC PANEL
ALT: 54 U/L — ABNORMAL HIGH (ref 0–53)
Alkaline Phosphatase: 53 U/L (ref 39–117)
Creat: 1.23 mg/dL (ref 0.50–1.35)
Sodium: 135 mEq/L (ref 135–145)
Total Bilirubin: 0.6 mg/dL (ref 0.3–1.2)
Total Protein: 7.5 g/dL (ref 6.0–8.3)

## 2011-12-04 LAB — HEMOGLOBIN A1C: Hgb A1c MFr Bld: 7.1 % — ABNORMAL HIGH (ref <5.7)

## 2011-12-05 LAB — MICROALBUMIN / CREATININE URINE RATIO
Creatinine, Urine: 261.9 mg/dL
Microalb Creat Ratio: 3.5 mg/g (ref 0.0–30.0)

## 2011-12-06 ENCOUNTER — Encounter: Payer: Self-pay | Admitting: Family Medicine

## 2011-12-06 ENCOUNTER — Ambulatory Visit (INDEPENDENT_AMBULATORY_CARE_PROVIDER_SITE_OTHER): Payer: Medicare HMO | Admitting: Family Medicine

## 2011-12-06 VITALS — BP 130/86 | HR 68 | Ht 68.0 in | Wt 190.0 lb

## 2011-12-06 DIAGNOSIS — Z23 Encounter for immunization: Secondary | ICD-10-CM

## 2011-12-06 DIAGNOSIS — E785 Hyperlipidemia, unspecified: Secondary | ICD-10-CM

## 2011-12-06 DIAGNOSIS — F329 Major depressive disorder, single episode, unspecified: Secondary | ICD-10-CM | POA: Insufficient documentation

## 2011-12-06 DIAGNOSIS — E119 Type 2 diabetes mellitus without complications: Secondary | ICD-10-CM

## 2011-12-06 DIAGNOSIS — I10 Essential (primary) hypertension: Secondary | ICD-10-CM

## 2011-12-06 DIAGNOSIS — F3289 Other specified depressive episodes: Secondary | ICD-10-CM | POA: Insufficient documentation

## 2011-12-06 MED ORDER — ENALAPRIL MALEATE 10 MG PO TABS: 10.0000 mg | ORAL_TABLET | Freq: Two times a day (BID) | ORAL | Status: DC

## 2011-12-06 NOTE — Progress Notes (Signed)
Chief Complaint  Patient presents with  . Diabetes    med ck-labs done 12/04/11.   HPI:  He is complaining of depressed mood related to the change in weather, feeling more anxious.  He feels it is related to the cold weather, not that it is dark, and that he isn't getting out to walk every morning like he used to.  See depression questionnaire.  He gets this way every year. Previously took Paxil, and recalls taking another medication but not the name.  Recalls that the Paxil made him drowsy, wouldn't want to take again.  He wants to move back to [place] but his wife doesn't, and this is contributing to his moods.  DM--eye exam last month.  Morning sugars are 91-110.  Checked twice mid-day, 170's, and once in the evening 117. Not exercising as much, only 1-2x/week, previously going 3-4x/week. Compliant with his diet and medications.  HTN--doesn't think his BP monitor is accurate.  Getting 140/90 at home.  Plans to get a new monitor.  Denies headaches or dizziness.  CAD--followed by Dr. Tenny Craw.  He reports that he only takes the Imdur as needed--can't even remember the last time he took it.  Denies any chest pain, exertional dyspnea.  GERD--controlled with omeprazole.  Had recurrent symptoms when he stopped medication.  Hyperlipidemia--compliant with simvastatin.  Denies side effects.  He stopped taking the flomax--didn't like "what it did to my brain", side effects, refuses to take.  He finds that the urozinc works well for him instead.  Also using saw palmetto.  He is also complaining of intermittent problems with pain in his L upper jaw, ?near the gums.  Has constant sniffles, but no sinus pain.  Denies jaw/TMJ pain.  Past Medical History  Diagnosis Date  . CAD (coronary artery disease)   . Dyslipidemia   . DM (diabetes mellitus)   . GERD (gastroesophageal reflux disease)   . Hypertension   . Hyperlipidemia   . Diabetes mellitus, type 2   . Microscopic hematuria     referred to  urology 2012  . Myocardial infarction 2003  . BPH (benign prostatic hyperplasia)   . Frequency   . Arthritis   . Enlarged prostate   . Anxiety   . Depression    Past Surgical History  Procedure Date  . Fracture surgery     RT TALAR REPAIR  . Cardiac catheterization 05/2010  . Cardiovascular stress test 07/2009  . Coronary angioplasty     2 stents in heart  . Coronary artery bypass graft     2 vessels  . Cystoscopy 02/09/2011    Procedure: CYSTOSCOPY;  Surgeon: Ky Barban, MD;  Location: [facility];  Service: Urology;  Laterality: N/A;   History   Social History  . Marital Status: Married    Spouse Name: N/A    Number of Children: 3  . Years of Education: N/A   Occupational History  . diasabiled    Social History Main Topics  . Smoking status: Never Smoker   . Smokeless tobacco: Never Used  . Alcohol Use: No  . Drug Use: No  . Sexually Active: Not on file   Other Topics Concern  . Not on file   Social History Narrative  . No narrative on file   Current outpatient prescriptions:ALOE PO, Take 1 tablet by mouth daily.  , Disp: , Rfl: ;  aspirin 81 MG tablet, Take 81 mg by mouth daily.  , Disp: , Rfl: ;  Boswellia-Glucosamine-Vit  D (GLUCOSAMINE COMPLEX PO), Take by mouth., Disp: , Rfl: ;  enalapril (VASOTEC) 10 MG tablet, Take 1 tablet (10 mg total) by mouth daily., Disp: 90 tablet, Rfl: 0 fish oil-omega-3 fatty acids 1000 MG capsule, Take 4 capsules daily (either all at once, or can take 2 capsules twice daily), Disp: , Rfl: ;  metoprolol succinate (TOPROL-XL) 25 MG 24 hr tablet, Take 1 tablet (25 mg total) by mouth daily., Disp: 90 tablet, Rfl: 0;  omeprazole (PRILOSEC) 40 MG capsule, Take 1 capsule (40 mg total) by mouth daily., Disp: 90 capsule, Rfl: 0 Saxagliptin-Metformin (KOMBIGLYZE XR) 05-998 MG TB24, Take 1 tablet by mouth daily., Disp: 90 tablet, Rfl: 0;  simvastatin (ZOCOR) 40 MG tablet, Take 1 tablet (40 mg total) by mouth at bedtime., Disp: 90 tablet, Rfl: 0;   Tamsulosin HCl (FLOMAX) 0.4 MG CAPS, Take 1 capsule (0.4 mg total) by mouth daily., Disp: 30 capsule, Rfl: 0;  isosorbide mononitrate (IMDUR) 30 MG 24 hr tablet, Take 30 mg by mouth as needed. , Disp: , Rfl:  nitroGLYCERIN (NITROSTAT) 0.4 MG SL tablet, Place 0.4 mg under the tongue every 5 (five) minutes as needed. Chest pain, Disp: , Rfl:  Continues to take hyaluronic acid and urinozinc (OTC supplements), and saw palmetto (in place of the Flomax, that he stopped taking)  ROS:  Denies fevers, chills, headaches, dizziness, chest pain, shortness of breath, URI symptoms, nausea, vomiting, abdominal pain, bowel changes, skin rashes/lesions, numbness/tingling.  +mouth/gum pain.  +depression.  See HPI  PHYSICAL EXAM: BP 130/86  Pulse 68  Ht 5\' 8"  (1.727 m)  Wt 190 lb (86.183 kg)  BMI 28.89 kg/m2 Well developed male in no distress HEENT: PERRL, EOMI, conjunctiva clear.  OP clear.  Mucosa normal without lesions.  Sinuses nontender.  TMJ's nontender, no clicking. Neck: no lymphadenopathy, thyromegaly or bruit Heart: regular rate and rhythm without murmur Lungs: clear bilaterally Extremities: no edema.  Normal sensation, 2+ pulses.  See diabetic foot exam sheet Skin: no rashes/lesions Psych: mildly depressed.  Full range of affect.  Normal eye contact, speech  Lab Results  Component Value Date   CHOL 144 12/04/2011   HDL 35* 12/04/2011   LDLCALC 84 12/04/2011   TRIG 126 12/04/2011   CHOLHDL 4.1 12/04/2011   Lab Results  Component Value Date   HGBA1C 7.1* 12/04/2011   (up from 6.4 3 months ago)  Normal urine microalbumin/Cr ratio, 3.5   ASSESSMENT/PLAN:  1. Type II or unspecified type diabetes mellitus without mention of complication, not stated as uncontrolled    2. Need for prophylactic vaccination and inoculation against influenza  Flu vaccine greater than or equal to 3yo preservative free IM  3. HYPERLIPIDEMIA-MIXED    4. HYPERTENSION, BENIGN    5. Hypertension, benign   enalapril (VASOTEC) 10 MG tablet  6. Depressive disorder, not elsewhere classified     mild   Mild depression:  Suggested [place] Wart to help with moods (he isn't really interested in rx anti-depressant).  Melatonin for his insomnia.  I strongly encouraged counseling, but he seems somewhat resistant.  HTN-increase enalapril to BID; monitor BP's.  DM--worse control since less exercise, more depressed.  Continue same meds.  Work on daily exercise and treating depression  CAD--follow up with Dr. Abran Duke Imdur (whether he should be taking daily or not).  F/u with dentist re: mouth/gum pain  F/u 3 months, A1c at visit, and nonfasting b-met since ACEI dose increased today.  30 minute visit, more than 1/2 spent counseling,  especially re: depression.

## 2011-12-06 NOTE — Patient Instructions (Addendum)
Increase your enalapril to twice daily, and monitor your blood pressures.  Goal blood pressure is <130/80.  Follow up with dentist (cleanings are recommended twice yearly, and you are having problems in your mouth).  Try and exercise daily.  Your sugars are higher than previously--watch your diet and exercise.   I recommend getting counseling for your mild depression, related partly to the weather, but clearly there are other issues to be addressed.  [facility]. John's Wart is a supplement that might help with moods, and okay to take melatonin to help with trouble sleeping.  Discuss your Imdur when you see Dr. Tenny Craw next (not sure when you are due--you can check with their office).

## 2012-01-09 ENCOUNTER — Other Ambulatory Visit: Payer: Self-pay | Admitting: Family Medicine

## 2012-01-24 DIAGNOSIS — C060 Malignant neoplasm of cheek mucosa: Secondary | ICD-10-CM

## 2012-01-24 DIAGNOSIS — Z8589 Personal history of malignant neoplasm of other organs and systems: Secondary | ICD-10-CM

## 2012-01-24 HISTORY — DX: Personal history of malignant neoplasm of other organs and systems: Z85.89

## 2012-01-24 HISTORY — DX: Malignant neoplasm of cheek mucosa: C06.0

## 2012-01-30 ENCOUNTER — Telehealth: Payer: Self-pay | Admitting: Internal Medicine

## 2012-01-31 ENCOUNTER — Other Ambulatory Visit: Payer: Self-pay | Admitting: *Deleted

## 2012-01-31 DIAGNOSIS — E782 Mixed hyperlipidemia: Secondary | ICD-10-CM

## 2012-01-31 MED ORDER — SIMVASTATIN 40 MG PO TABS: 40.0000 mg | ORAL_TABLET | Freq: Every day | ORAL | Status: DC

## 2012-02-01 NOTE — Telephone Encounter (Signed)
Done

## 2012-03-01 ENCOUNTER — Other Ambulatory Visit: Payer: Self-pay | Admitting: Family Medicine

## 2012-03-01 ENCOUNTER — Other Ambulatory Visit: Payer: Self-pay | Admitting: Otolaryngology

## 2012-03-01 DIAGNOSIS — C060 Malignant neoplasm of cheek mucosa: Secondary | ICD-10-CM

## 2012-03-01 DIAGNOSIS — E119 Type 2 diabetes mellitus without complications: Secondary | ICD-10-CM

## 2012-03-01 MED ORDER — SAXAGLIPTIN-METFORMIN ER 5-1000 MG PO TB24: 1.0000 | ORAL_TABLET | Freq: Every day | ORAL | Status: DC

## 2012-03-04 ENCOUNTER — Encounter (HOSPITAL_COMMUNITY): Payer: Self-pay | Admitting: Pharmacy Technician

## 2012-03-04 ENCOUNTER — Ambulatory Visit (INDEPENDENT_AMBULATORY_CARE_PROVIDER_SITE_OTHER): Payer: Medicare HMO | Admitting: Family Medicine

## 2012-03-04 ENCOUNTER — Encounter: Payer: Self-pay | Admitting: Family Medicine

## 2012-03-04 VITALS — BP 132/72 | HR 60 | Ht 68.0 in | Wt 189.0 lb

## 2012-03-04 DIAGNOSIS — E119 Type 2 diabetes mellitus without complications: Secondary | ICD-10-CM

## 2012-03-04 DIAGNOSIS — I2510 Atherosclerotic heart disease of native coronary artery without angina pectoris: Secondary | ICD-10-CM

## 2012-03-04 DIAGNOSIS — R945 Abnormal results of liver function studies: Secondary | ICD-10-CM

## 2012-03-04 DIAGNOSIS — R7989 Other specified abnormal findings of blood chemistry: Secondary | ICD-10-CM

## 2012-03-04 DIAGNOSIS — I10 Essential (primary) hypertension: Secondary | ICD-10-CM

## 2012-03-04 DIAGNOSIS — E785 Hyperlipidemia, unspecified: Secondary | ICD-10-CM

## 2012-03-04 LAB — HEPATIC FUNCTION PANEL
ALT: 28 U/L (ref 0–53)
AST: 24 U/L (ref 0–37)
Albumin: 4.9 g/dL (ref 3.5–5.2)
Alkaline Phosphatase: 59 U/L (ref 39–117)
Total Bilirubin: 0.5 mg/dL (ref 0.3–1.2)
Total Protein: 8.1 g/dL (ref 6.0–8.3)

## 2012-03-04 MED ORDER — NITROGLYCERIN 0.4 MG SL SUBL: 0.4000 mg | SUBLINGUAL_TABLET | SUBLINGUAL | Status: DC | PRN

## 2012-03-04 NOTE — Patient Instructions (Addendum)
We will be in touch with Dr. Dietrich Pates regarding your surgical clearance, and then we will let Dr. Ezzard Standing know. We are repeating your liver tests today.   If persistently elevated, we will see about getting CT tomorrow when you are getting other scans.

## 2012-03-04 NOTE — Progress Notes (Signed)
Chief Complaint  Patient presents with  . Medical Clearance    scheduled for surgery this Friday with Dr. Ezzard Standing (ENT) diagnosed with cancer of the left cheek. Needs refill on nitro.   Patient presents to get surgical clearance prior to getting surgery for removal of cncer L mouth, and removal of enlarged L submandibular lymph node.  At his last visit here in November, he had been complaining of some jaw pain; found to have normal exam, referred to see his dentist for further evaluation.  At that time he was having some vague pain, burning pain with rinsing.  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  He was referred to Dr. Ezzard Standing.  He is getting CT scan tomorrow, surgery on Friday.  Having swollen gland prior to seeing dentist, but has gotten worse since the biopsy.  CAD--last cardiac cath 05/2010.  Not taking imdur daily, just prn chest pain.  Hasn't needed for about 6 months.  Needs refill on nitrostat as it expired, never needs to use.  Denies any chest pain.  Denies claudication.  He hasn't been exercising regularly, feeling somewhat down related to the weather and stress from cancer/surgery.  Sometimes he has trouble falling asleep at night.  Cardiologist is Dr. Gunnar Fusi Ross--but hasn't seen since last year (02/2011 per pt).  DM--Sugars <120 in the mornings.  2 hours after eating sugars are 140-160 (only occasionally checked).   He gets frustrated when his sugars are high. Lab Results  Component Value Date   HGBA1C 7.1* 12/04/2011   Past Medical History  Diagnosis Date  . CAD (coronary artery disease)   . Dyslipidemia   . DM (diabetes mellitus)   . GERD (gastroesophageal reflux disease)   . Hypertension   . Hyperlipidemia   . Diabetes mellitus, type 2   . Microscopic hematuria     referred to urology 2012  . Myocardial infarction 2003  . BPH (benign prostatic hyperplasia)   . Frequency   . Arthritis   . Enlarged prostate   . Anxiety   .  Depression    Past Surgical History  Procedure Laterality Date  . Fracture surgery      RT TALAR REPAIR  . Cardiac catheterization  05/2010  . Cardiovascular stress test  07/2009  . Coronary angioplasty      2 stents in heart  . Coronary artery bypass graft  2005    2 vessels  . Cystoscopy  02/09/2011    Procedure: CYSTOSCOPY;  Surgeon: Ky Barban, MD;  Location: [facility];  Service: Urology;  Laterality: N/A;   History   Social History  . Marital Status: Married    Spouse Name: N/A    Number of Children: 3  . Years of Education: N/A   Occupational History  . diasabiled    Social History Main Topics  . Smoking status: Never Smoker   . Smokeless tobacco: Never Used  . Alcohol Use: No  . Drug Use: No  . Sexually Active: Not on file   Other Topics Concern  . Not on file   Social History Narrative  . No narrative on file   Family History  Problem Relation Age of Onset  . Stroke Sister   . Anesthesia problems Neg Hx   . Hypotension Neg Hx   . Malignant hyperthermia Neg Hx   . Pseudochol deficiency Neg Hx   . Colon cancer Neg Hx   . Cancer Neg Hx   .  Heart disease Neg Hx   . Diabetes Brother   . Seizures Mother   . Asthma Father    Current Outpatient Prescriptions on File Prior to Visit  Medication Sig Dispense Refill  . ALOE PO Take 20 mg by mouth daily.       [name] aspirin 81 MG tablet Take 81 mg by mouth daily.        . simvastatin (ZOCOR) 40 MG tablet Take 1 tablet (40 mg total) by mouth at bedtime.  90 tablet  0   No current facility-administered medications on file prior to visit.   No Known Allergies  ROS: Denies dizziness, headaches, fevers, URI symptoms, cough, shortness of breath, chest pain, edema.  Gets some pain/inflammation at ankles periodically (R ankle swells occasionally, related to bypass surgery).  Denies nausea, vomiting, heartburn, bowel changes. Denies dysuria, hematuria or other concerns.  PHYSICAL EXAM: BP 132/72  Pulse 60  Ht 5'  8" (1.727 m)  Wt 189 lb (85.73 kg)  BMI 28.74 kg/m2 Pleasant, slightly anxious appearing male in no distress, accompanied by his wife HEENT:  PERRL, EOMI, conjunctiva clear.  TM's and EAC's normal.  OP--moist mucus membranes.  L upper posterior jaw--ulcerated mass Neck: Significantly enlarged (and tender) swollen lymph node on left, submandibular.  Tender. No erythema, warmth or fluctuance Heart: regular rate and rhythm without murmur Lungs: clear bilaterally Back: no spine or CVA tenderness Extremities: no edema, normal sensation, no lesions   Lab Results  Component Value Date   ALT 54* 12/04/2011   AST 72* 12/04/2011   ALKPHOS 53 12/04/2011   BILITOT 0.6 12/04/2011    EKG--sinus bradycardia 58.  Old anterior MI, poor RWP. No acute changes  ASSESSMENT/PLAN:  1. Abnormal LFTs  Hepatic function panel   Hepatic function panel  2. CAD (coronary artery disease)  nitroGLYCERIN (NITROSTAT) 0.4 MG SL tablet   nitroGLYCERIN (NITROSTAT) 0.4 MG SL tablet  3. Type II or unspecified type diabetes mellitus without mention of complication, not stated as uncontrolled    4. HYPERTENSION, BENIGN    5. HYPERLIPIDEMIA-MIXED    6. CAD      CAD--called to Dr. Dietrich Pates. Spoke with Dr. Myrtis Ser (on call for Dr. Tenny Craw), who reviewed previous cath result, as well as compared today's EKG with previous EKG.  Reportedly is unchanged.  Upon review of this info, Dr. Myrtis Ser has cleared him for surgery (without needing to see cardiology separately).  Elevated LFT's.  Recheck today Scan scheduled for 1:30 tomorrow--if LFT's remain elevated, add on CT abdomen  HTN--controlled

## 2012-03-05 ENCOUNTER — Ambulatory Visit
Admission: RE | Admit: 2012-03-05 | Discharge: 2012-03-05 | Disposition: A | Discharge status: Home / Self Care | Payer: Medicare HMO | Source: Ambulatory Visit | Attending: Radiation Oncology | Admitting: Radiation Oncology

## 2012-03-05 ENCOUNTER — Ambulatory Visit
Admission: RE | Admit: 2012-03-05 | Discharge: 2012-03-05 | Disposition: A | Discharge status: Home / Self Care | Payer: Medicare HMO | Source: Ambulatory Visit | Attending: Otolaryngology | Admitting: Otolaryngology

## 2012-03-05 ENCOUNTER — Encounter: Payer: Self-pay | Admitting: Radiation Oncology

## 2012-03-05 VITALS — BP 148/87 | HR 61 | Temp 97.5°F | Resp 16 | Ht 69.0 in | Wt 187.7 lb

## 2012-03-05 DIAGNOSIS — C060 Malignant neoplasm of cheek mucosa: Secondary | ICD-10-CM | POA: Insufficient documentation

## 2012-03-05 DIAGNOSIS — Z951 Presence of aortocoronary bypass graft: Secondary | ICD-10-CM | POA: Insufficient documentation

## 2012-03-05 DIAGNOSIS — K219 Gastro-esophageal reflux disease without esophagitis: Secondary | ICD-10-CM | POA: Insufficient documentation

## 2012-03-05 DIAGNOSIS — E119 Type 2 diabetes mellitus without complications: Secondary | ICD-10-CM | POA: Insufficient documentation

## 2012-03-05 DIAGNOSIS — I10 Essential (primary) hypertension: Secondary | ICD-10-CM | POA: Insufficient documentation

## 2012-03-05 DIAGNOSIS — I2510 Atherosclerotic heart disease of native coronary artery without angina pectoris: Secondary | ICD-10-CM | POA: Insufficient documentation

## 2012-03-05 DIAGNOSIS — I252 Old myocardial infarction: Secondary | ICD-10-CM | POA: Insufficient documentation

## 2012-03-05 DIAGNOSIS — Z79899 Other long term (current) drug therapy: Secondary | ICD-10-CM | POA: Insufficient documentation

## 2012-03-05 DIAGNOSIS — E785 Hyperlipidemia, unspecified: Secondary | ICD-10-CM | POA: Insufficient documentation

## 2012-03-05 MED ORDER — IOHEXOL 300 MG/ML  SOLN
75.0000 mL | Freq: Once | INTRAMUSCULAR | Status: AC | PRN
  Administered 2012-03-05: 75 mL via INTRAVENOUS

## 2012-03-05 NOTE — Progress Notes (Signed)
[name] Come here today for assessment of his oral cavity in the left upper check area behind his upper teeth.  He states this area is tender and it makes it difficult to eat on the left side.  He has an enlarged node under the left jaw region.  He takes Ibuprofen for pain but notes that his pain is less since the biopsy and that he is able to open his mouth wider since the biopsy.

## 2012-03-05 NOTE — Progress Notes (Signed)
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 male who reports that in early November he developed discomfort in the left side of his mouth. This was noticeable most when he used mouthwash, it burned. He saw his PCP who could not appreciate any sores in his mouth and the patient was referred to a dentist. By the time he saw his dentist around the middle January, an ulcerative lesion was visible in the left buccal mucosa. The patient was referred to Dr. Egbert Garibaldi of [facility] oral surgery. He underwent a biopsy dated 02/26/2012, incisional biopsy, demonstrating well-differentiated squamous cell carcinoma.  The patient also reports that he has had some pain in the upper left posterior gingiva, it feels like a toothache or tooth decay. He is not sure if there is some swelling in this region. He reports that as a teenager he had Chronic toothaches in the upper molars. He reports no prior history of tobacco abuse or alcohol abuse. He has no prior history of chewing betel nuts. No repeated mucosal injury that he knows of. He does not wear dentures or partials. He denies odynophagia or dysphagia. He denies any significant weight loss.  The patient has since seen Dr. Ezzard Standing of otolaryngology. He is tentatively scheduled for surgery this Friday. Earlier today, the patient underwent CT imaging of his maxillary tissues/ neck. No mucosal lesion was identified. I also cannot appreciate any lesions in the maxillary bone. However, there is a 17 mm lymph node in the left level Ib  region and a worrisome 13 mm lymph node at level II.  There are other subcentimeter lymph nodes that are suspicious in the level Ib region and level 2/3 regions of the left neck. No adenopathy in the right neck. In the upper right paratracheal region there is a 15 mm (axially measured) lymph node, somewhat concerning for metastatic disease.    PREVIOUS RADIATION THERAPY: No  PAST MEDICAL HISTORY:  has a past medical history of CAD (coronary artery disease); Dyslipidemia; DM (diabetes mellitus); GERD (gastroesophageal reflux disease); Hypertension; Hyperlipidemia; Diabetes mellitus, type 2; Microscopic hematuria; Myocardial infarction (2003); BPH (benign prostatic hyperplasia); Frequency; Arthritis; Enlarged prostate; Anxiety; and Depression.    PAST SURGICAL HISTORY: Past Surgical History  Procedure Laterality Date  . Fracture surgery      RT TALAR REPAIR  . Cardiac catheterization  05/2010  . Cardiovascular stress test  07/2009  . Coronary angioplasty      2 stents in heart  . Coronary artery bypass graft  2005    2 vessels  . Cystoscopy  02/09/2011    Procedure: CYSTOSCOPY;  Surgeon: Ky Barban, MD;  Location: [facility];  Service: Urology;  Laterality: N/A;    FAMILY HISTORY: family history includes Asthma in his father; Diabetes in his brother; Seizures in his mother; and Stroke in his sister.  There is no history of Anesthesia problems, and Hypotension, and Malignant hyperthermia, and Pseudochol deficiency, and Colon cancer, and Cancer, and Heart disease, .  SOCIAL HISTORY:  reports that he has never smoked. He has never used smokeless tobacco. He reports that he does not drink alcohol or  use illicit drugs. he is originally from [place] but before moving to [place], he had lived in [place] with his wife. Wife present today, she is very supportive  ALLERGIES: Review of patient's allergies indicates no known allergies.  MEDICATIONS:  Current Outpatient Prescriptions  Medication Sig Dispense Refill  . ALOE PO Take 20 mg  by mouth daily.       [name] aspirin 81 MG tablet Take 81 mg by mouth daily.        . enalapril (VASOTEC) 10 MG tablet Take 10 mg by mouth 2 (two) times daily.      [name] Hyaluronic Acid-Vitamin C (HYALURONIC ACID PO) Take 80 mg by mouth daily. Takes 1 tablet daily      . ibuprofen (ADVIL,MOTRIN) 200 MG tablet Take 200 mg by mouth every 6 (six) hours as needed for pain. For pain      . metoprolol succinate (TOPROL-XL) 25 MG 24 hr tablet Take 25 mg by mouth at bedtime.      . nitroGLYCERIN (NITROSTAT) 0.4 MG SL tablet Place 1 tablet (0.4 mg total) under the tongue every 5 (five) minutes as needed. Chest pain  25 tablet  1  . omeprazole (PRILOSEC) 40 MG capsule Take 40 mg by mouth daily.      [name] OVER THE COUNTER MEDICATION Take 1 tablet by mouth daily.       . Saw Palmetto, Serenoa repens, 320 MG CAPS Take 1 capsule by mouth daily.      . Saxagliptin-Metformin 05-998 MG TB24 Take 1 tablet by mouth daily.      . simvastatin (ZOCOR) 40 MG tablet Take 1 tablet (40 mg total) by mouth at bedtime.  90 tablet  0  . Vitamins C E (VITAMIN C & E COMPLEX) 500-400 MG-UNIT CAPS Take 1 capsule by mouth daily.       No current facility-administered medications for this encounter.    REVIEW OF SYSTEMS:  Pertinent items are noted in HPI.   PHYSICAL EXAM:  height is 5\' 9"  (1.753 m) and weight is 187 lb 11.2 oz (85.14 kg). His temperature is 97.5 F (36.4 C). His blood pressure is 148/87 and his pulse is 61. His respiration is 16.   General: Alert and oriented, in no acute distress HEENT: Head is normocephalic. Pupils are equally round and reactive to light. Extraocular movements are intact. Dentition in fair repair. I cannot appreciate any loose teeth or swelling/lesion in the left posterior gingiva. However, the adjacent to the left posterior molars, in buccal mucosa, there is an ulcerative lesion with surrounding swelling and erythema, about 3 cm in greatest dimension Neck: 2 cm firm palpable lymph node in the left level  Ib region, bordering on level II. I cannot appreciate any other palpable lymphadenopathy. Heart: Regular in rate and rhythm with no murmurs, rubs, or gallops. Chest: Clear to auscultation bilaterally, with no rhonchi, wheezes, or rales. Abdomen: Soft, nontender, nondistended, with no rigidity or guarding. Extremities: No cyanosis or edema. Lymphatics: No concerning lymphadenopathy. Skin: No concerning lesions. Musculoskeletal: symmetric strength and muscle tone throughout. Neurologic: Cranial nerves II through XII are grossly intact. No obvious focalities. Speech is fluent. Coordination is intact. Psychiatric: Judgment and insight are intact. Affect is appropriate.   LABORATORY DATA:  Lab Results  Component Value Date   WBC 7.2 05/26/2010   HGB 14.1 02/03/2011   HCT 43.0 02/03/2011   MCV 81.2 05/26/2010   PLT 148* 05/26/2010   CMP     Component  Value Date/Time   NA 135 12/04/2011 0921   K 3.7 12/04/2011 0921   CL 101 12/04/2011 0921   CO2 25 12/04/2011 0921   GLUCOSE 107* 12/04/2011 0921   BUN 17 12/04/2011 0921   CREATININE 1.23 12/04/2011 0921   CREATININE 1.16 02/03/2011 1430   CALCIUM 9.3 12/04/2011 0921   PROT 8.1 03/04/2012 1455   ALBUMIN 4.9 03/04/2012 1455   AST 24 03/04/2012 1455   ALT 28 03/04/2012 1455   ALKPHOS 59 03/04/2012 1455   BILITOT 0.5 03/04/2012 1455   GFRNONAA 66* 02/03/2011 1430   GFRAA 77* 02/03/2011 1430         RADIOGRAPHY: Ct Soft Tissue Neck W Contrast  03/05/2012  *RADIOLOGY REPORT*  Clinical Data:  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 Question enlarged lymph node.  BUN and creatinine were obtained on site at [facility] at [address] Results:  BUN 15 mg/dL,  Creatinine 1.1 mg/dL.  CT MAXILLOFACIAL WITH CONTRAST  Technique:  Multidetector CT imaging of the maxillofacial structure s was performed with intravenous contrast. Multiplanar CT image reconstructions were als o generated.  A small metallic BB was placed on the right temple in  order to reliably differentiate right from left.  Contrast: 75mL OMNIPAQUE IOHEXOL 300 MG/ML  SOLN  Comparison:   None.  Findings:  Based discrete skin mass is not evident to adjacent to the area marked.  The paranasal sinuses and mastoid air cells are clear.  Limited imaging of the brain is unremarkable.  A heterogeneous peripherally enhancing left submandibular lymph node measures 17 x 15 x 17 mm.  There are two additional left submandibular lymph nodes measuring 5 and 7 mm respectively.  The largest node is highly worsened metastatic disease.  This raises concern in the other two nodes as well.  A smaller ovoid node is present in the right submandibular station.  There is thickening of the left platysma muscle and extension into the left face.  The bone windows demonstrate no focal lytic or blastic lesion to suggest metastatic disease.  IMPRESSION:  1.  Metastatic left submandibular lymph nodes.  The largest measures 17 x 15 x 17 mm with peripheral enhancement and central necrosis. 2.  Two additional smaller left centimeter lymph nodes are also worrisome for metastatic disease. 3.  Inflammatory changes of the left face and submandibular neck with thickening of the platysma. 4.  The primary lesion is not well visualized by CT.  *RADIOLOGY REPORT*  CT NECK WITH CONTRAST  Technique:  Multidetector CT imaging of the neck was performed using the standard protocol following the bolus administration of intravenous contrast.  Contrast: 75mL OMNIPAQUE IOHEXOL 300 MG/ML  SOLN  Findings: No significant mucosal or submucosal lesions are present in the remainder the neck.  Vocal cords are midline and symmetric. The thyroid is within normal limits.  A 15 x 10 mm high right paratracheal lymph node is present.  The adjacent node measures 7 mm in short axis.  A prominent left jugulodigastric lymph node measures 25 mm in long axis but is thin and ovoid.  There is a somewhat rounded more anterior left level II lymph node which  measures 8 x 13 mm and is more suspicious for metastatic disease.  Borderline posterior left level II and level III lymph nodes are present as well.  Scattered right cervical lymph nodes appear benign.  The lung apices are clear apart from mild atelectasis.  Patient is status post median sternotomy for CABG.  Mild degenerative changes are present in the cervical spine.  No focal lytic or blastic lesions are present.  IMPRESSION:  1.  17 mm left submandibular lymph node is highly worsened for metastatic disease. 2.  25 mm left jugulodigastric lymph node is otherwise very narrow and likely benign. 3.  A more rounded 13 mm anterior left level II lymph node is more suspicious for metastatic disease. 4.  Additional borderline left level II level III lymph nodes are present. 5.  No significant right-sided cervical adenopathy. 6.  No primary mucosal lesion is evident. 7.  Mild degenerative changes of the cervical spine are evident without evidence for metastatic disease. 8.  Enlarged high right paratracheal lymph node raises the possibility of distant metastases.   Original Report Authenticated By: Marin Roberts, M.D.         IMPRESSION/PLAN: This is a delightful 62 year old gentleman with  T2 N2b Mx squamous cell carcinoma of the buccal mucosa, well differentiated. Plan is as below:  1) I have scheduled him for the next available PET scan this Friday morning, February 14, to complete staging. This will verify if the paratracheal lymph node is in fact hypermetabolic and suspicious for metastatic disease. If it is hypermetabolic, I will recommend biopsy. I've discussed this with critical care/pulmonary medicine; one of the interventional physicians is going to page me back once he reviews the patient's scans to verify if it would be amenable to EBUS guided biopsy. If he in fact has metastatic disease the paratracheal nodes, he has stage IVB disease. Technically, this would not be considered curable, however,  if that is the only site of metastatic disease I think it would still warrant relatively aggressive treatment for local regional control. I would need to discuss further with otolaryngology whether surgery is still in the patient's best interest if he does have upper tracheal lymphadenopathy. Otherwise, definitive chemoradiotherapy could be considered. I have put the patient on for tumor Board conference next week. Also, Dr. Ezzard Standing I have discussed the patient extensively and we will regroup this Friday after his PET scan. Dr. Ezzard Standing will call the patient to let him know that his surgery is going to be delayed indefinitely, until we get the PET scan results.  2) Will refer to med/onc as there is a considerable possibility he'll need chemotherapy  3) The necessity for other referrals, such as dentistry, social work, nutrition, interventional radiology for PEG tube placement, and swallowing therapy will be determined after staging is complete  It was a pleasure meeting the patient today. We discussed the risks, benefits, and side effects of radiotherapy.  We talk about the plans above, and rationale to complete staging.   The patient is enthusiastic about proceeding with treatment. I look forward to participating in the patient's care.  I spent 60 minutes minutes face to face with the patient and more than 50% of that time was spent in counseling and/or coordination of care.   __________________________________________   Lonie Peak, MD

## 2012-03-06 ENCOUNTER — Telehealth: Payer: Self-pay | Admitting: Oncology

## 2012-03-06 ENCOUNTER — Telehealth: Payer: Self-pay | Admitting: *Deleted

## 2012-03-06 ENCOUNTER — Other Ambulatory Visit: Payer: Self-pay | Admitting: Radiation Oncology

## 2012-03-06 ENCOUNTER — Encounter: Payer: Self-pay | Admitting: Radiation Oncology

## 2012-03-06 DIAGNOSIS — R590 Localized enlarged lymph nodes: Secondary | ICD-10-CM

## 2012-03-06 DIAGNOSIS — C060 Malignant neoplasm of cheek mucosa: Secondary | ICD-10-CM

## 2012-03-06 NOTE — Progress Notes (Signed)
I spoke with pulmonary medicine.  They are concerned that the patient's upper tracheal node would not be amenable to endobronchial ultrasound-guided biopsy. They suggested I call cardiothoracic surgery. I spoke with Dr. Dorris Fetch. He has graciously offered to see the patient as a consult on Monday afternoon 2/17 to evaluate the need for biopsy. We will call his office to schedule this. I spoke with the patient and his wife about these plans. Dr. Dorris Fetch, as well as the patient and his wife, understand that if the PET scan is negative, biopsy may not be needed.    -----------------------------------  Lonie Peak, MD

## 2012-03-06 NOTE — Telephone Encounter (Signed)
S/W pt in re NP appt 2/18 @ 9:30 w/Dr. Gaylyn Rong.

## 2012-03-06 NOTE — Telephone Encounter (Signed)
Called patient to inform of test on 03-08-12, lvm for a return call.

## 2012-03-07 ENCOUNTER — Inpatient Hospital Stay (HOSPITAL_COMMUNITY): Admission: RE | Admit: 2012-03-07 | Payer: Medicare HMO | Source: Ambulatory Visit

## 2012-03-07 NOTE — Addendum Note (Signed)
Encounter addended by: Delynn Flavin, RN on: 03/07/2012 11:38 AM<BR>     Documentation filed: Charges VN

## 2012-03-08 ENCOUNTER — Encounter: Payer: Self-pay | Admitting: *Deleted

## 2012-03-08 ENCOUNTER — Telehealth: Payer: Self-pay | Admitting: *Deleted

## 2012-03-08 ENCOUNTER — Encounter (HOSPITAL_COMMUNITY): Admission: RE | Payer: Self-pay | Source: Ambulatory Visit

## 2012-03-08 ENCOUNTER — Inpatient Hospital Stay (HOSPITAL_COMMUNITY): Admission: RE | Admit: 2012-03-08 | Payer: Medicare HMO | Source: Ambulatory Visit | Admitting: Otolaryngology

## 2012-03-08 ENCOUNTER — Encounter (HOSPITAL_COMMUNITY)
Admission: RE | Admit: 2012-03-08 | Discharge: 2012-03-08 | Disposition: A | Discharge status: Home / Self Care | Payer: Medicare HMO | Source: Ambulatory Visit | Attending: Radiation Oncology | Admitting: Radiation Oncology

## 2012-03-08 ENCOUNTER — Encounter (HOSPITAL_COMMUNITY): Payer: Self-pay

## 2012-03-08 ENCOUNTER — Encounter (HOSPITAL_COMMUNITY): Payer: Self-pay | Admitting: Pharmacy Technician

## 2012-03-08 DIAGNOSIS — C060 Malignant neoplasm of cheek mucosa: Secondary | ICD-10-CM | POA: Insufficient documentation

## 2012-03-08 SURGERY — DISSECTION, NECK, RADICAL
Anesthesia: General | Site: Neck | Laterality: Left

## 2012-03-08 MED ORDER — FLUDEOXYGLUCOSE F - 18 (FDG) INJECTION
19.0000 | Freq: Once | INTRAVENOUS | Status: AC | PRN
  Administered 2012-03-08: 19 via INTRAVENOUS

## 2012-03-08 NOTE — Telephone Encounter (Signed)
XXXX 

## 2012-03-08 NOTE — Progress Notes (Signed)
He is scheduled for Monday Feb. 17 at 3:30 pm with Dr. Dorris Fetch- Cardio-Thoracic Service

## 2012-03-08 NOTE — Telephone Encounter (Signed)
Called patient to inform of appt. With Dr. Dorris Fetch on 03-11-12 - arrival time - 3:15 pm - address - [address]- ph. [phone], spoke with patient's wife, Myriam Jacobson and they are aware of this appt.

## 2012-03-11 ENCOUNTER — Encounter: Payer: Medicare HMO | Admitting: Thoracic Surgery (Cardiothoracic Vascular Surgery)

## 2012-03-12 ENCOUNTER — Encounter: Payer: Medicare HMO | Admitting: Family Medicine

## 2012-03-12 ENCOUNTER — Other Ambulatory Visit: Payer: Medicare HMO

## 2012-03-12 ENCOUNTER — Ambulatory Visit: Payer: Medicare HMO

## 2012-03-12 ENCOUNTER — Telehealth: Payer: Self-pay | Admitting: Oncology

## 2012-03-12 ENCOUNTER — Encounter: Payer: Self-pay | Admitting: Oncology

## 2012-03-12 ENCOUNTER — Ambulatory Visit (HOSPITAL_BASED_OUTPATIENT_CLINIC_OR_DEPARTMENT_OTHER): Payer: Medicare HMO | Admitting: Oncology

## 2012-03-12 ENCOUNTER — Encounter (HOSPITAL_COMMUNITY): Payer: Self-pay

## 2012-03-12 VITALS — BP 137/78 | HR 59 | Temp 97.1°F | Resp 18 | Ht 68.0 in | Wt 189.6 lb

## 2012-03-12 DIAGNOSIS — C060 Malignant neoplasm of cheek mucosa: Secondary | ICD-10-CM

## 2012-03-12 LAB — COMPREHENSIVE METABOLIC PANEL (CC13)
Alkaline Phosphatase: 77 U/L (ref 40–150)
Glucose: 177 mg/dl — ABNORMAL HIGH (ref 70–99)
Sodium: 142 mEq/L (ref 136–145)
Total Bilirubin: 0.51 mg/dL (ref 0.20–1.20)
Total Protein: 7.9 g/dL (ref 6.4–8.3)

## 2012-03-12 LAB — CBC WITH DIFFERENTIAL/PLATELET
Basophils Absolute: 0.1 10*3/uL (ref 0.0–0.1)
Eosinophils Absolute: 0.5 10*3/uL (ref 0.0–0.5)
HCT: 40.5 % (ref 38.4–49.9)
LYMPH%: 23.8 % (ref 14.0–49.0)
MCV: 82.1 fL (ref 79.3–98.0)
MONO%: 7.7 % (ref 0.0–14.0)
NEUT#: 4.4 10*3/uL (ref 1.5–6.5)
NEUT%: 60.7 % (ref 39.0–75.0)
Platelets: 165 10*3/uL (ref 140–400)
RBC: 4.93 10*6/uL (ref 4.20–5.82)

## 2012-03-12 NOTE — Telephone Encounter (Signed)
Gave pt appt for MD for [facility]Arch 2014

## 2012-03-12 NOTE — Patient Instructions (Addendum)
1.  Diagnosis:  Head and neck Squamous cell carcinoma: - Primary location:  Buccal mucosa.  - Potential causes:  Smoking, alcohol, human papilloma virus (HPV) - Stage:  Stage III.  - Prognosis:  Chance of cure about 70%.  - Treatment options:   * Upfront resection/surgery followed by adjuvant radiation and possibly with also chemotherapy (if there are high risk features).  Without chemoradiation, there will be a high risk of local and distal recurrence of cancer.   * Upfront concurrent chemoradiation.  This is not my first option.   Oral cavity cancer has best chance of cure with resection first followed by radiation +/- chemo   - Plan:   Head/neck tumor board tomorrow to decide definitive treatment plan.

## 2012-03-12 NOTE — Progress Notes (Signed)
[facility]  [phone] [phone]   MEDICAL ONCOLOGY - INITIAL CONSULATION    Referral MD:  Dr. Lonie Peak, M.D.   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    HPI: Gregory Friedman is a 62 year-old Latino American man with no history of smoking, chewing tobacco, or drinking excessive EtOH.  He noticed in 11/2011 that each time he used mouth wash, he had left mouth sore.  He was referred to his dentist in 01/2012 who referred to Dr. Egbert Garibaldi and oral surgeon.  He underwent biopsy on 02/26/2012.  Pathology was reviewed at [facility], case # [id number] consistent with well differentiated squamous cell carcinoma.  He was evaluated by Dr. Ezzard Standing from ENT, Dr. Basilio Cairo from Rad Onc who referred patient to me for med onc opinion.   Gregory Friedman presented to the clinic today for the first time with his wife.  He noticed enlargement of the left bucal/alveolar ridge and also left level II node.  He denied fever, headache, hearing loss, tinnitus, ear pain, dysphagia, odynophagia, anorexia, fatigue, bleeding from the lesion or anywhere else, chest pain, SOB, abd pain, abd distension, jaundice, any other node swelling, pedal edema, back pain, bowel/bladder incontinence, nausea/vomiting.  He is very active of all activities of daily living.  The rest of the 14-point review of system was negative.       Past Medical History  Diagnosis Date  . Dyslipidemia   . DM (diabetes mellitus)     type II.   [name] GERD (gastroesophageal reflux disease)   . Hyperlipidemia   . Microscopic hematuria     referred to urology 2012  . Myocardial infarction 2003    with both PCI, and CABG  . BPH (benign prostatic hyperplasia)   . Arthritis   . Anxiety   . Depression   . Hypertension     sees Dr  Clarene Critchley. Nkapp  . Diverticulosis   :  Past Surgical History    Procedure Laterality Date  . Fracture surgery      RT TALAR REPAIR  . Cardiac catheterization  05/2010  . Cardiovascular stress test  07/2009  . Coronary angioplasty  2003; 2006    2 stents in heart  . Coronary artery bypass graft  01/2003    2 vessels  . Cystoscopy  02/09/2011    Procedure: CYSTOSCOPY;  Surgeon: Ky Barban, MD;  Location: [facility];  Service: Urology;  Laterality: N/A;  . Colonoscopy  01/2011    polyp; next due 2018.   :  Current Outpatient Prescriptions  Medication Sig Dispense Refill  . ALOE PO Take 20 mg by mouth daily.       [name] aspirin 81 MG tablet Take 81 mg by mouth daily.        . enalapril (VASOTEC) 10 MG tablet Take 10 mg by mouth 2 (two) times daily.      [name] Hyaluronic Acid-Vitamin C (HYALURONIC ACID PO) Take 80 mg by mouth daily. Takes 1 tablet daily      . ibuprofen (ADVIL,MOTRIN) 200 MG tablet Take 200 mg by mouth every 6 (six) hours as needed for pain. For pain      . metoprolol succinate (TOPROL-XL) 25 MG 24 hr tablet Take 25 mg by mouth at bedtime.      . nitroGLYCERIN (NITROSTAT) 0.4 MG SL tablet Place 1 tablet (  0.4 mg total) under the tongue every 5 (five) minutes as needed. Chest pain  25 tablet  1  . omeprazole (PRILOSEC) 40 MG capsule Take 40 mg by mouth daily.      [name] OVER THE COUNTER MEDICATION Take 1 tablet by mouth daily.       . Saw Palmetto, Serenoa repens, 320 MG CAPS Take 1 capsule by mouth daily.      . Saxagliptin-Metformin 05-998 MG TB24 Take 1 tablet by mouth daily.      . simvastatin (ZOCOR) 40 MG tablet Take 1 tablet (40 mg total) by mouth at bedtime.  90 tablet  0  . Vitamins C E (VITAMIN C & E COMPLEX) 500-400 MG-UNIT CAPS Take 1 capsule by mouth daily.       No current facility-administered medications for this visit.     No Known Allergies:  Family History  Problem Relation Age of Onset  . Stroke Sister   . Anesthesia problems Neg Hx   . Hypotension Neg Hx   . Malignant hyperthermia Neg Hx   . Pseudochol deficiency  Neg Hx   . Colon cancer Neg Hx   . Heart disease Neg Hx   . Diabetes Brother   . Seizures Mother   . Asthma Father   . Cancer Daughter     thyroid cancer  :  History   Social History  . Marital Status: Married    Spouse Name: N/A    Number of Children: 3  . Years of Education: N/A   Occupational History  . 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 Smoking status: Never Smoker   . Smokeless tobacco: Never Used  . Alcohol Use: No  . Drug Use: No  . Sexually Active: Not on file   Other Topics Concern  . Not on file   Social History Narrative  . No narrative on file  :  Exam: ECOG 0  General:  well-nourished man, in no acute distress.  Eyes:  no scleral icterus.  ENT:  Positive for about 2x2cm left upper alveolar ridge and nearby buccal ulceration.   Neck was without thyromegaly.  Lymphatics:  Negative for supraclavicular or axillary adenopathy.  Positive for a 2x2 cm left level II cervical node.  Respiratory: lungs were clear bilaterally without wheezing or crackles.  Cardiovascular:  Regular rate and rhythm, S1/S2, without murmur, rub or gallop.  There was no pedal edema.  GI:  abdomen was soft, flat, nontender, nondistended, without organomegaly.  Muscoloskeletal:  no spinal tenderness of palpation of vertebral spine.  Skin exam was without echymosis, petichae.  Neuro exam was nonfocal.  Patient was able to get on and off exam table without assistance.  Gait was normal.  Patient was alerted and oriented.  Attention was good.   Language was appropriate.  Mood was normal without depression.  Speech was not pressured.  Thought content was not tangential.     Lab Results  Component Value Date   WBC 7.2 03/12/2012   HGB 13.6 03/12/2012   HCT 40.5 03/12/2012   PLT 165 03/12/2012   GLUCOSE 177* 03/12/2012   CHOL 144 12/04/2011   TRIG 126 12/04/2011   HDL 35* 12/04/2011   LDLCALC 84 12/04/2011   ALT 31 03/12/2012   AST 23 03/12/2012   NA 142 03/12/2012   K  4.0 03/12/2012   CL 105 03/12/2012   CREATININE 1.5* 03/12/2012   BUN 16.3 03/12/2012  CO2 28 03/12/2012    Ct Soft Tissue Neck W Contrast  03/05/2012  *RADIOLOGY REPORT*  Clinical Data:  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 Question enlarged lymph node.  BUN and creatinine were obtained on site at [facility] at [address] Results:  BUN 15 mg/dL,  Creatinine 1.1 mg/dL.  CT MAXILLOFACIAL WITH CONTRAST  Technique:  Multidetector CT imaging of the maxillofacial structure s was performed with intravenous contrast. Multiplanar CT image reconstructions were als o generated.  A small metallic BB was placed on the right temple in order to reliably differentiate right from left.  Contrast: 75mL OMNIPAQUE IOHEXOL 300 MG/ML  SOLN  Comparison:   None.  Findings:  Based discrete skin mass is not evident to adjacent to the area marked.  The paranasal sinuses and mastoid air cells are clear.  Limited imaging of the brain is unremarkable.  A heterogeneous peripherally enhancing left submandibular lymph node measures 17 x 15 x 17 mm.  There are two additional left submandibular lymph nodes measuring 5 and 7 mm respectively.  The largest node is highly worsened metastatic disease.  This raises concern in the other two nodes as well.  A smaller ovoid node is present in the right submandibular station.  There is thickening of the left platysma muscle and extension into the left face.  The bone windows demonstrate no focal lytic or blastic lesion to suggest metastatic disease.  IMPRESSION:  1.  Metastatic left submandibular lymph nodes.  The largest measures 17 x 15 x 17 mm with peripheral enhancement and central necrosis. 2.  Two additional smaller left centimeter lymph nodes are also worrisome for metastatic disease. 3.  Inflammatory changes of the left face and submandibular neck with thickening of the platysma. 4.  The primary lesion is not well visualized by CT.  *RADIOLOGY REPORT*  CT NECK WITH CONTRAST   Technique:  Multidetector CT imaging of the neck was performed using the standard protocol following the bolus administration of intravenous contrast.  Contrast: 75mL OMNIPAQUE IOHEXOL 300 MG/ML  SOLN  Findings: No significant mucosal or submucosal lesions are present in the remainder the neck.  Vocal cords are midline and symmetric. The thyroid is within normal limits.  A 15 x 10 mm high right paratracheal lymph node is present.  The adjacent node measures 7 mm in short axis.  A prominent left jugulodigastric lymph node measures 25 mm in long axis but is thin and ovoid.  There is a somewhat rounded more anterior left level II lymph node which measures 8 x 13 mm and is more suspicious for metastatic disease.  Borderline posterior left level II and level III lymph nodes are present as well.  Scattered right cervical lymph nodes appear benign.  The lung apices are clear apart from mild atelectasis.  Patient is status post median sternotomy for CABG.  Mild degenerative changes are present in the cervical spine.  No focal lytic or blastic lesions are present.  IMPRESSION:  1.  17 mm left submandibular lymph node is highly worsened for metastatic disease. 2.  25 mm left jugulodigastric lymph node is otherwise very narrow and likely benign. 3.  A more rounded 13 mm anterior left level II lymph node is more suspicious for metastatic disease. 4.  Additional borderline left level II level III lymph nodes are present. 5.  No significant right-sided cervical adenopathy. 6.  No primary mucosal lesion is evident. 7.  Mild degenerative changes of the cervical spine are evident  without evidence for metastatic disease. 8.  Enlarged high right paratracheal lymph node raises the possibility of distant metastases.   Original Report Authenticated By: Marin Roberts, M.D.    Nm Pet Image Initial (pi) Skull Base To Thigh  03/08/2012  *RADIOLOGY REPORT*  Clinical Data: Initial treatment strategy for squamous cell carcinoma of the  buccal mucosa.  Cervical adenopathy on CT.  NUCLEAR MEDICINE PET SKULL BASE TO THIGH  Fasting Blood Glucose:  131  Technique:  19.0 mCi F-18 FDG was injected intravenously. CT data was obtained and used for attenuation correction and anatomic localization only.  (This was not acquired as a diagnostic CT examination.) Additional exam technical data entered on technologist worksheet.  Comparison:  CTs of the face and neck 03/05/2012.  Findings:  Neck: A separate acquisition of the head and neck was obtained. There is focal hypermetabolic activity in the left maxillary region adjacent to the molars, presumably corresponding with the primary malignancy within the oral cavity.  This has an SUV max of 16.7. The previously demonstrated dominant left submandibular lymph node is also hypermetabolic.  This has an SUV max of 13.7 and measures 1.9 x 1.6 cm on image 39.  There is no other hypermetabolic cervical lymphadenopathy.  No other lesions of the pharyngeal mucosal space are identified.  Chest:  There is no hypermetabolic mediastinal, hilar or axillary adenopathy.  There is no abnormal pulmonary activity.  The lungs are clear aside from mild dependent atelectasis bilaterally.  There are small high right paratracheal lymph nodes which are not hypermetabolic.  Diffuse atherosclerosis is noted status post CABG.  Abdomen/Pelvis:  There is no hypermetabolic activity within the liver, adrenal glands, spleen or pancreas.  There are no hypermetabolic abdominal pelvic lymph nodes.  Small gallstones, atherosclerosis and mild lumbar spondylosis are noted.  Skeleton:  No focal hypermetabolic activity to suggest skeletal metastasis.  IMPRESSION:  1.  Hypermetabolic activity in the left maxillary alveolar ridge, presumably related to known primary buccal squamous cell carcinoma. 2.  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  No other metastatic disease identified. 3.  Cholelithiasis and atherosclerosis noted.   Original  Report Authenticated By: Carey Bullocks, M.D.    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 Question enlarged lymph node.  BUN and creatinine were obtained on site at [facility] at [address] Results:  BUN 15 mg/dL,  Creatinine 1.1 mg/dL.  CT MAXILLOFACIAL WITH CONTRAST  Technique:  Multidetector CT imaging of the maxillofacial structure s was performed with intravenous contrast. Multiplanar CT image reconstructions were als o generated.  A small metallic BB was placed on the right temple in order to reliably differentiate right from left.  Contrast: 75mL OMNIPAQUE IOHEXOL 300 MG/ML  SOLN  Comparison:   None.  Findings:  Based discrete skin mass is not evident to adjacent to the area marked.  The paranasal sinuses and mastoid air cells are clear.  Limited imaging of the brain is unremarkable.  A heterogeneous peripherally enhancing left submandibular lymph node measures 17 x 15 x 17 mm.  There are two additional left submandibular lymph nodes measuring 5 and 7 mm respectively.  The largest node is highly worsened metastatic disease.  This raises concern in the other two nodes as well.  A smaller ovoid node is present in the right submandibular station.  There is thickening of the left platysma muscle and extension into the left face.  The bone windows demonstrate no focal lytic or blastic lesion to suggest metastatic disease.  IMPRESSION:  1.  Metastatic left submandibular lymph nodes.  The largest measures 17 x 15 x 17 mm with peripheral enhancement and central necrosis. 2.  Two additional smaller left centimeter lymph nodes are also worrisome for metastatic disease. 3.  Inflammatory changes of the left face and submandibular neck with thickening of the platysma. 4.  The primary lesion is not well visualized by CT.  *RADIOLOGY REPORT*  CT NECK WITH CONTRAST  Technique:  Multidetector CT imaging of the neck was performed  using the standard protocol following the bolus administration of intravenous contrast.  Contrast: 75mL OMNIPAQUE IOHEXOL 300 MG/ML  SOLN  Findings: No significant mucosal or submucosal lesions are present in the remainder the neck.  Vocal cords are midline and symmetric. The thyroid is within normal limits.  A 15 x 10 mm high right paratracheal lymph node is present.  The adjacent node measures 7 mm in short axis.  A prominent left jugulodigastric lymph node measures 25 mm in long axis but is thin and ovoid.  There is a somewhat rounded more anterior left level II lymph node which measures 8 x 13 mm and is more suspicious for metastatic disease.  Borderline posterior left level II and level III lymph nodes are present as well.  Scattered right cervical lymph nodes appear benign.  The lung apices are clear apart from mild atelectasis.  Patient is status post median sternotomy for CABG.  Mild degenerative changes are present in the cervical spine.  No focal lytic or blastic lesions are present.  IMPRESSION:  1.  17 mm left submandibular lymph node is highly worsened for metastatic disease. 2.  25 mm left jugulodigastric lymph node is otherwise very narrow and likely benign. 3.  A more rounded 13 mm anterior left level II lymph node is more suspicious for metastatic disease. 4.  Additional borderline left level II level III lymph nodes are present. 5.  No significant right-sided cervical adenopathy. 6.  No primary mucosal lesion is evident. 7.  Mild degenerative changes of the cervical spine are evident without evidence for metastatic disease. 8.  Enlarged high right paratracheal lymph node raises the possibility of distant metastases.   Original Report Authenticated By: Marin Roberts, M.D.     Assessment and Plan:   1.  Hyperlipidemia:  On simvastatin.  2.  DM, type II:  On saxagliptin-Metformin.  3.  CAD:  Not active at this time.  He is on ASA, metroprolol, enalapril, statin.  4.  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    - Treatment options:   * Upfront resection in addition to left neck dissection followed by adjuvant radiation and possibly with also chemotherapy (if there are high risk features).  Without chemoradiation, there will be a high risk of local and distal recurrence of cancer.   * Upfront concurrent chemoradiation.  This is not my first option.   Oral cavity cancer has best chance of cure with resection first followed by radiation +/- chemo   - Plan:   Head/neck tumor board tomorrow to decide definitive treatment plan.  However, per his report, he is posted for OR tomorrow with Dr. Ezzard Standing.  I will see him post op to determine whether he would benefit from adjuvant chemo in addition to radiation concurrently.    The length of time of the face-to-face encounter was 45  minutes. More than 50% of time was spent counseling and coordination of care.     Thank you for this referral.

## 2012-03-12 NOTE — H&P (Signed)
PREOPERATIVE H&P  Chief Complaint: left buccal scca  HPI: Gregory Friedman is a 62 y.o. male who presents for evaluation of left buccal scca. He's had a sore for a couple of months involving the left buccal mucosa. He was referred by his dentist to Dr Remigio Eisenmenger who performed a biopsy 3 weeks ago which revealed SCCa. A follow up CT scan and PET scan showed 1 positive left submandibular node. He's taken to the OR for wide excision of left buccal cancer and left neck dissection.  Past Medical History  Diagnosis Date  . Dyslipidemia   . DM (diabetes mellitus)     type II.   [name] GERD (gastroesophageal reflux disease)   . Hyperlipidemia   . Microscopic hematuria     referred to urology 2012  . Myocardial infarction 2003    with both PCI, and CABG  . BPH (benign prostatic hyperplasia)   . Arthritis   . Anxiety   . Depression   . Hypertension     sees Dr  Clarene Critchley. Nkapp  . Diverticulosis    Past Surgical History  Procedure Laterality Date  . Fracture surgery      RT TALAR REPAIR  . Cardiac catheterization  05/2010  . Cardiovascular stress test  07/2009  . Coronary angioplasty  2003; 2006    2 stents in heart  . Coronary artery bypass graft  01/2003    2 vessels  . Cystoscopy  02/09/2011    Procedure: CYSTOSCOPY;  Surgeon: Ky Barban, MD;  Location: [facility];  Service: Urology;  Laterality: N/A;  . Colonoscopy  01/2011    polyp; next due 2018.    History   Social History  . Marital Status: Married    Spouse Name: N/A    Number of Children: 3  . Years of Education: N/A   Occupational History  . 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 Smoking status: Never Smoker   . Smokeless tobacco: Never Used  . Alcohol Use: No  . Drug Use: No  . Sexually Active: None   Other Topics Concern  . None   Social History Narrative  . None   Family History  Problem Relation Age of Onset  . Stroke Sister   . Anesthesia problems Neg Hx   . Hypotension Neg Hx   .  Malignant hyperthermia Neg Hx   . Pseudochol deficiency Neg Hx   . Colon cancer Neg Hx   . Heart disease Neg Hx   . Diabetes Brother   . Seizures Mother   . Asthma Father   . Cancer Daughter     thyroid cancer   No Known Allergies Prior to Admission medications   Medication Sig Start Date End Date Taking? Authorizing Provider  ALOE PO Take 20 mg by mouth daily.    Yes Historical Provider, MD  aspirin 81 MG tablet Take 81 mg by mouth daily.     Yes Historical Provider, MD  enalapril (VASOTEC) 10 MG tablet Take 10 mg by mouth 2 (two) times daily. 12/06/11  Yes Joselyn Arrow, MD  Hyaluronic Acid-Vitamin C (HYALURONIC ACID PO) Take 80 mg by mouth daily. Takes 1 tablet daily   Yes Historical Provider, MD  ibuprofen (ADVIL,MOTRIN) 200 MG tablet Take 200 mg by mouth every 6 (six) hours as needed for pain. For pain   Yes Historical Provider, MD  metoprolol succinate (TOPROL-XL) 25 MG 24 hr tablet Take 25 mg by mouth at  bedtime.   Yes Historical Provider, MD  nitroGLYCERIN (NITROSTAT) 0.4 MG SL tablet Place 1 tablet (0.4 mg total) under the tongue every 5 (five) minutes as needed. Chest pain 03/04/12  Yes Joselyn Arrow, MD  omeprazole (PRILOSEC) 40 MG capsule Take 40 mg by mouth daily.   Yes Historical Provider, MD  OVER THE COUNTER MEDICATION Take 1 tablet by mouth daily.    Yes Historical Provider, MD  Saw Palmetto, Serenoa repens, 320 MG CAPS Take 1 capsule by mouth daily.   Yes Historical Provider, MD  Saxagliptin-Metformin 05-998 MG TB24 Take 1 tablet by mouth daily. 03/01/12  Yes Joselyn Arrow, MD  simvastatin (ZOCOR) 40 MG tablet Take 1 tablet (40 mg total) by mouth at bedtime. 01/31/12  Yes Joselyn Arrow, MD  Vitamins C E (VITAMIN C & E COMPLEX) 500-400 MG-UNIT CAPS Take 1 capsule by mouth daily.   Yes Historical Provider, MD     Positive ROS: left oral soreness  All other systems have been reviewed and were otherwise negative with the exception of those mentioned in the HPI and as above.  Physical  Exam: There were no vitals filed for this visit.  General: Alert, no acute distress Oral: Normal  Tonsils. Approximate 3-4 cm ulcer left buccal mucosa just inferior to the parotid duct Nasal: Clear nasal passages Neck: No palpable thyroid nodules. Firm 2 cm left submandibular node. Ear: Ear canal is clear with normal appearing TMs Cardiovascular: Regular rate and rhythm, no murmur.  Respiratory: Clear to auscultation Neurologic: Alert and oriented x 3   Assessment/Plan: LEFT BUCCAL SQUAMOUS CELL CARCINOMA Plan for Procedure(s):WIDE EXCISION LEFT BUCCAL CANCER AND LEFT RADICAL NECK DISSECTION   Dillard Cannon, MD 03/12/2012 5:19 PM

## 2012-03-12 NOTE — Progress Notes (Signed)
Checked in new patient. No financial issues. °

## 2012-03-13 ENCOUNTER — Encounter (HOSPITAL_COMMUNITY): Payer: Self-pay | Admitting: *Deleted

## 2012-03-13 ENCOUNTER — Encounter (HOSPITAL_COMMUNITY)
Admission: RE | Disposition: A | Discharge status: Home / Self Care | Payer: Self-pay | Source: Ambulatory Visit | Attending: Otolaryngology

## 2012-03-13 ENCOUNTER — Inpatient Hospital Stay (HOSPITAL_COMMUNITY)
Admission: RE | Admit: 2012-03-13 | Discharge: 2012-03-15 | DRG: 129 | Disposition: A | Discharge status: Home / Self Care | Payer: Medicare HMO | Source: Ambulatory Visit | Attending: Otolaryngology | Admitting: Otolaryngology

## 2012-03-13 ENCOUNTER — Encounter (HOSPITAL_COMMUNITY): Payer: Self-pay | Admitting: Certified Registered"

## 2012-03-13 ENCOUNTER — Inpatient Hospital Stay (HOSPITAL_COMMUNITY): Payer: Medicare HMO | Admitting: Certified Registered"

## 2012-03-13 DIAGNOSIS — Z9861 Coronary angioplasty status: Secondary | ICD-10-CM

## 2012-03-13 DIAGNOSIS — C770 Secondary and unspecified malignant neoplasm of lymph nodes of head, face and neck: Secondary | ICD-10-CM | POA: Diagnosis present

## 2012-03-13 DIAGNOSIS — N400 Enlarged prostate without lower urinary tract symptoms: Secondary | ICD-10-CM | POA: Diagnosis present

## 2012-03-13 DIAGNOSIS — E119 Type 2 diabetes mellitus without complications: Secondary | ICD-10-CM | POA: Diagnosis present

## 2012-03-13 DIAGNOSIS — M129 Arthropathy, unspecified: Secondary | ICD-10-CM | POA: Diagnosis present

## 2012-03-13 DIAGNOSIS — F3289 Other specified depressive episodes: Secondary | ICD-10-CM | POA: Diagnosis present

## 2012-03-13 DIAGNOSIS — K219 Gastro-esophageal reflux disease without esophagitis: Secondary | ICD-10-CM | POA: Diagnosis present

## 2012-03-13 DIAGNOSIS — Z7982 Long term (current) use of aspirin: Secondary | ICD-10-CM

## 2012-03-13 DIAGNOSIS — Z951 Presence of aortocoronary bypass graft: Secondary | ICD-10-CM

## 2012-03-13 DIAGNOSIS — F411 Generalized anxiety disorder: Secondary | ICD-10-CM | POA: Diagnosis present

## 2012-03-13 DIAGNOSIS — I252 Old myocardial infarction: Secondary | ICD-10-CM

## 2012-03-13 DIAGNOSIS — C060 Malignant neoplasm of cheek mucosa: Secondary | ICD-10-CM

## 2012-03-13 DIAGNOSIS — K089 Disorder of teeth and supporting structures, unspecified: Secondary | ICD-10-CM | POA: Diagnosis present

## 2012-03-13 DIAGNOSIS — E785 Hyperlipidemia, unspecified: Secondary | ICD-10-CM | POA: Diagnosis present

## 2012-03-13 DIAGNOSIS — F329 Major depressive disorder, single episode, unspecified: Secondary | ICD-10-CM | POA: Diagnosis present

## 2012-03-13 HISTORY — PX: TOOTH EXTRACTION: SHX859

## 2012-03-13 HISTORY — PX: RADICAL NECK DISSECTION: SHX2284

## 2012-03-13 HISTORY — PX: MASS EXCISION: SHX2000

## 2012-03-13 LAB — GLUCOSE, CAPILLARY: Glucose-Capillary: 240 mg/dL — ABNORMAL HIGH (ref 70–99)

## 2012-03-13 LAB — SURGICAL PCR SCREEN: MRSA, PCR: NEGATIVE

## 2012-03-13 SURGERY — DISSECTION, NECK, RADICAL
Anesthesia: General | Site: Neck | Wound class: Contaminated

## 2012-03-13 MED ORDER — ONDANSETRON HCL 4 MG PO TABS: 4.0000 mg | ORAL_TABLET | ORAL | Status: DC | PRN

## 2012-03-13 MED ORDER — OXYCODONE HCL 5 MG PO TABS: 5.0000 mg | ORAL_TABLET | Freq: Once | ORAL | Status: DC | PRN

## 2012-03-13 MED ORDER — CEFAZOLIN SODIUM 1-5 GM-% IV SOLN
1.0000 g | Freq: Three times a day (TID) | INTRAVENOUS | Status: DC
  Administered 2012-03-13 – 2012-03-15 (×6): 1 g via INTRAVENOUS
  Filled 2012-03-13 (×7): qty 50

## 2012-03-13 MED ORDER — NITROGLYCERIN 0.4 MG SL SUBL: 0.4000 mg | SUBLINGUAL_TABLET | SUBLINGUAL | Status: DC | PRN

## 2012-03-13 MED ORDER — BACITRACIN ZINC 500 UNIT/GM EX OINT
TOPICAL_OINTMENT | CUTANEOUS | Status: DC | PRN
  Administered 2012-03-13: 1 via TOPICAL

## 2012-03-13 MED ORDER — LIDOCAINE-EPINEPHRINE 1 %-1:100000 IJ SOLN
INTRAMUSCULAR | Status: AC
  Filled 2012-03-13: qty 1

## 2012-03-13 MED ORDER — ONDANSETRON HCL 4 MG/2ML IJ SOLN
4.0000 mg | Freq: Four times a day (QID) | INTRAMUSCULAR | Status: AC | PRN
  Administered 2012-03-13: 4 mg via INTRAVENOUS

## 2012-03-13 MED ORDER — ONDANSETRON HCL 4 MG/2ML IJ SOLN
INTRAMUSCULAR | Status: DC | PRN
  Administered 2012-03-13: 4 mg via INTRAVENOUS

## 2012-03-13 MED ORDER — 0.9 % SODIUM CHLORIDE (POUR BTL) OPTIME
TOPICAL | Status: DC | PRN
  Administered 2012-03-13: 1000 mL

## 2012-03-13 MED ORDER — EPHEDRINE SULFATE 50 MG/ML IJ SOLN
INTRAMUSCULAR | Status: DC | PRN
  Administered 2012-03-13: 5 mg via INTRAVENOUS
  Administered 2012-03-13: 15 mg via INTRAVENOUS
  Administered 2012-03-13 (×3): 5 mg via INTRAVENOUS

## 2012-03-13 MED ORDER — ZOLPIDEM TARTRATE 5 MG PO TABS: 5.0000 mg | ORAL_TABLET | Freq: Every evening | ORAL | Status: DC | PRN

## 2012-03-13 MED ORDER — HYDROCODONE-ACETAMINOPHEN 7.5-325 MG/15ML PO SOLN
10.0000 mL | ORAL | Status: DC | PRN
  Administered 2012-03-14 – 2012-03-15 (×3): 15 mL via ORAL
  Filled 2012-03-13 (×3): qty 15

## 2012-03-13 MED ORDER — CEFAZOLIN SODIUM-DEXTROSE 2-3 GM-% IV SOLR
INTRAVENOUS | Status: AC
  Administered 2012-03-13: 2 g via INTRAVENOUS
  Filled 2012-03-13: qty 50

## 2012-03-13 MED ORDER — OXYCODONE HCL 5 MG/5ML PO SOLN: 5.0000 mg | Freq: Once | ORAL | Status: DC | PRN

## 2012-03-13 MED ORDER — DOUBLE ANTIBIOTIC 500-10000 UNIT/GM EX OINT
TOPICAL_OINTMENT | CUTANEOUS | Status: AC
  Filled 2012-03-13: qty 1

## 2012-03-13 MED ORDER — PROMETHAZINE HCL 25 MG/ML IJ SOLN
INTRAMUSCULAR | Status: AC
  Administered 2012-03-13: 6.25 mg
  Filled 2012-03-13: qty 1

## 2012-03-13 MED ORDER — ONDANSETRON HCL 4 MG/2ML IJ SOLN: 4.0000 mg | INTRAMUSCULAR | Status: DC | PRN

## 2012-03-13 MED ORDER — LIDOCAINE-EPINEPHRINE 1 %-1:100000 IJ SOLN
INTRAMUSCULAR | Status: DC | PRN
  Administered 2012-03-13: 6 mL

## 2012-03-13 MED ORDER — KCL IN DEXTROSE-NACL 20-5-0.45 MEQ/L-%-% IV SOLN
INTRAVENOUS | Status: DC
  Administered 2012-03-13 – 2012-03-15 (×4): via INTRAVENOUS
  Filled 2012-03-13 (×7): qty 1000

## 2012-03-13 MED ORDER — MORPHINE SULFATE 2 MG/ML IJ SOLN
2.0000 mg | INTRAMUSCULAR | Status: DC | PRN
  Administered 2012-03-13 – 2012-03-15 (×4): 2 mg via INTRAVENOUS
  Filled 2012-03-13 (×2): qty 2
  Filled 2012-03-13: qty 1

## 2012-03-13 MED ORDER — METOPROLOL SUCCINATE ER 25 MG PO TB24
25.0000 mg | ORAL_TABLET | Freq: Every day | ORAL | Status: DC
  Administered 2012-03-13 – 2012-03-14 (×2): 25 mg via ORAL
  Filled 2012-03-13 (×3): qty 1

## 2012-03-13 MED ORDER — IBUPROFEN 100 MG/5ML PO SUSP
400.0000 mg | Freq: Four times a day (QID) | ORAL | Status: DC | PRN
  Administered 2012-03-13 – 2012-03-15 (×4): 400 mg via ORAL
  Filled 2012-03-13 (×4): qty 20

## 2012-03-13 MED ORDER — ROCURONIUM BROMIDE 100 MG/10ML IV SOLN
INTRAVENOUS | Status: DC | PRN
  Administered 2012-03-13: 50 mg via INTRAVENOUS

## 2012-03-13 MED ORDER — WHITE PETROLATUM GEL
Status: AC
  Filled 2012-03-13: qty 5

## 2012-03-13 MED ORDER — MIDAZOLAM HCL 5 MG/5ML IJ SOLN
INTRAMUSCULAR | Status: DC | PRN
  Administered 2012-03-13: 2 mg via INTRAVENOUS

## 2012-03-13 MED ORDER — ARTIFICIAL TEARS OP OINT
TOPICAL_OINTMENT | OPHTHALMIC | Status: DC | PRN
  Administered 2012-03-13: 1 via OPHTHALMIC

## 2012-03-13 MED ORDER — PROPOFOL 10 MG/ML IV BOLUS
INTRAVENOUS | Status: DC | PRN
  Administered 2012-03-13: 200 mg via INTRAVENOUS

## 2012-03-13 MED ORDER — BACITRACIN ZINC 500 UNIT/GM EX OINT
1.0000 "application " | TOPICAL_OINTMENT | Freq: Three times a day (TID) | CUTANEOUS | Status: DC
  Administered 2012-03-13 – 2012-03-15 (×5): 1 via TOPICAL
  Filled 2012-03-13: qty 15

## 2012-03-13 MED ORDER — KCL IN DEXTROSE-NACL 20-5-0.45 MEQ/L-%-% IV SOLN
INTRAVENOUS | Status: AC
  Filled 2012-03-13: qty 1000

## 2012-03-13 MED ORDER — HYDROMORPHONE HCL PF 1 MG/ML IJ SOLN
0.2500 mg | INTRAMUSCULAR | Status: DC | PRN
  Administered 2012-03-13 (×2): 0.5 mg via INTRAVENOUS

## 2012-03-13 MED ORDER — MUPIROCIN 2 % EX OINT
TOPICAL_OINTMENT | Freq: Two times a day (BID) | CUTANEOUS | Status: DC
  Administered 2012-03-13 – 2012-03-14 (×3): via NASAL
  Filled 2012-03-13 (×2): qty 22

## 2012-03-13 MED ORDER — LIDOCAINE HCL (CARDIAC) 20 MG/ML IV SOLN
INTRAVENOUS | Status: DC | PRN
  Administered 2012-03-13: 80 mg via INTRAVENOUS

## 2012-03-13 MED ORDER — INSULIN ASPART 100 UNIT/ML ~~LOC~~ SOLN
0.0000 [IU] | Freq: Every day | SUBCUTANEOUS | Status: DC
  Administered 2012-03-13: 2 [IU] via SUBCUTANEOUS

## 2012-03-13 MED ORDER — LACTATED RINGERS IV SOLN
INTRAVENOUS | Status: DC | PRN
  Administered 2012-03-13 (×2): via INTRAVENOUS

## 2012-03-13 MED ORDER — ENALAPRIL MALEATE 10 MG PO TABS
10.0000 mg | ORAL_TABLET | Freq: Two times a day (BID) | ORAL | Status: DC
  Administered 2012-03-13 – 2012-03-15 (×4): 10 mg via ORAL
  Filled 2012-03-13 (×5): qty 1

## 2012-03-13 MED ORDER — INSULIN ASPART 100 UNIT/ML ~~LOC~~ SOLN
0.0000 [IU] | Freq: Three times a day (TID) | SUBCUTANEOUS | Status: DC
  Administered 2012-03-13: 5 [IU] via SUBCUTANEOUS
  Administered 2012-03-14 (×2): 3 [IU] via SUBCUTANEOUS
  Administered 2012-03-15: 2 [IU] via SUBCUTANEOUS

## 2012-03-13 MED ORDER — HYDROMORPHONE HCL PF 1 MG/ML IJ SOLN
INTRAMUSCULAR | Status: AC
  Filled 2012-03-13: qty 1

## 2012-03-13 MED ORDER — SAXAGLIPTIN-METFORMIN ER 5-1000 MG PO TB24: 1.0000 | ORAL_TABLET | Freq: Every day | ORAL | Status: DC

## 2012-03-13 MED ORDER — FENTANYL CITRATE 0.05 MG/ML IJ SOLN
INTRAMUSCULAR | Status: DC | PRN
  Administered 2012-03-13 (×3): 50 ug via INTRAVENOUS
  Administered 2012-03-13: 100 ug via INTRAVENOUS
  Administered 2012-03-13 (×3): 50 ug via INTRAVENOUS
  Administered 2012-03-13: 100 ug via INTRAVENOUS
  Administered 2012-03-13: 50 ug via INTRAVENOUS

## 2012-03-13 MED ORDER — ONDANSETRON HCL 4 MG/2ML IJ SOLN
INTRAMUSCULAR | Status: AC
  Filled 2012-03-13: qty 2

## 2012-03-13 SURGICAL SUPPLY — 62 items
ATTRACTOMAT 16X20 MAGNETIC DRP (DRAPES) ×3 IMPLANT
BLADE SURG 10 STRL SS (BLADE) ×3 IMPLANT
BLADE SURG 12 STRL SS (BLADE) ×3 IMPLANT
BLADE SURG ROTATE 9660 (MISCELLANEOUS) IMPLANT
CANISTER SUCTION 2500CC (MISCELLANEOUS) ×3 IMPLANT
CLEANER TIP ELECTROSURG 2X2 (MISCELLANEOUS) ×3 IMPLANT
CLOTH BEACON ORANGE TIMEOUT ST (SAFETY) ×3 IMPLANT
CONT SPEC 4OZ CLIKSEAL STRL BL (MISCELLANEOUS) ×6 IMPLANT
CORDS BIPOLAR (ELECTRODE) ×3 IMPLANT
COVER SURGICAL LIGHT HANDLE (MISCELLANEOUS) ×6 IMPLANT
COVER TABLE BACK 60X90 (DRAPES) ×3 IMPLANT
DECANTER SPIKE VIAL GLASS SM (MISCELLANEOUS) ×3 IMPLANT
DRAIN PENROSE 1/2X12 LTX STRL (WOUND CARE) ×3 IMPLANT
DRAIN SNY 7 FPER (WOUND CARE) ×3 IMPLANT
DRAPE PROXIMA HALF (DRAPES) ×3 IMPLANT
ELECT COATED BLADE 2.86 ST (ELECTRODE) ×9 IMPLANT
ELECT REM PT RETURN 9FT ADLT (ELECTROSURGICAL) ×3
ELECTRODE REM PT RTRN 9FT ADLT (ELECTROSURGICAL) ×2 IMPLANT
EVACUATOR SILICONE 100CC (DRAIN) ×3 IMPLANT
GAUZE SPONGE 4X4 16PLY XRAY LF (GAUZE/BANDAGES/DRESSINGS) ×9 IMPLANT
GLOVE BIO SURGEON STRL SZ7 (GLOVE) ×3 IMPLANT
GLOVE BIO SURGEON STRL SZ7.5 (GLOVE) ×6 IMPLANT
GLOVE BIOGEL PI IND STRL 7.0 (GLOVE) ×6 IMPLANT
GLOVE BIOGEL PI IND STRL 7.5 (GLOVE) ×4 IMPLANT
GLOVE BIOGEL PI INDICATOR 7.0 (GLOVE) ×3
GLOVE BIOGEL PI INDICATOR 7.5 (GLOVE) ×2
GLOVE SS BIOGEL STRL SZ 7.5 (GLOVE) ×10 IMPLANT
GLOVE SUPERSENSE BIOGEL SZ 7.5 (GLOVE) ×5
GLOVE SURG SS PI 7.5 STRL IVOR (GLOVE) ×9 IMPLANT
GOWN PREVENTION PLUS XLARGE (GOWN DISPOSABLE) ×15 IMPLANT
GOWN STRL NON-REIN LRG LVL3 (GOWN DISPOSABLE) ×9 IMPLANT
GOWN STRL REIN XL XLG (GOWN DISPOSABLE) ×6 IMPLANT
KIT BASIN OR (CUSTOM PROCEDURE TRAY) ×3 IMPLANT
KIT ROOM TURNOVER OR (KITS) ×3 IMPLANT
LOCATOR NERVE 3 VOLT (DISPOSABLE) ×3 IMPLANT
MARKER SKIN DUAL TIP RULER LAB (MISCELLANEOUS) ×6 IMPLANT
NS IRRIG 1000ML POUR BTL (IV SOLUTION) ×6 IMPLANT
PAD ARMBOARD 7.5X6 YLW CONV (MISCELLANEOUS) ×6 IMPLANT
PENCIL BUTTON HOLSTER BLD 10FT (ELECTRODE) ×6 IMPLANT
PENCIL FOOT CONTROL (ELECTRODE) ×3 IMPLANT
SPECIMEN JAR MEDIUM (MISCELLANEOUS) ×3 IMPLANT
SPONGE INTESTINAL PEANUT (DISPOSABLE) ×3 IMPLANT
SPONGE LAP 18X18 X RAY DECT (DISPOSABLE) ×3 IMPLANT
STAPLER VISISTAT 35W (STAPLE) ×3 IMPLANT
SUCTION HEMOVAC SNY HYST (SUCTIONS) IMPLANT
SUT CHROMIC 3 0 PS 2 (SUTURE) ×9 IMPLANT
SUT CHROMIC GUT 2 0 PS 2 27 (SUTURE) ×6 IMPLANT
SUT ETHILON 5 0 P 3 18 (SUTURE) ×1
SUT NYLON ETHILON 5-0 P-3 1X18 (SUTURE) ×2 IMPLANT
SUT SILK 2 0 (SUTURE) ×3
SUT SILK 2 0 FS (SUTURE) ×3 IMPLANT
SUT SILK 2 0 SH (SUTURE) ×3 IMPLANT
SUT SILK 2 0 SH CR/8 (SUTURE) ×3 IMPLANT
SUT SILK 2-0 18XBRD TIE 12 (SUTURE) ×6 IMPLANT
SUT SILK 3 0 (SUTURE) ×3
SUT SILK 3-0 18XBRD TIE 12 (SUTURE) ×6 IMPLANT
TOWEL OR 17X24 6PK STRL BLUE (TOWEL DISPOSABLE) ×3 IMPLANT
TOWEL OR 17X26 10 PK STRL BLUE (TOWEL DISPOSABLE) ×6 IMPLANT
TRAY ENT MC OR (CUSTOM PROCEDURE TRAY) ×3 IMPLANT
TRAY FOLEY CATH 14FRSI W/METER (CATHETERS) ×3 IMPLANT
TUBE CONNECTING 12X1/4 (SUCTIONS) ×6 IMPLANT
WATER STERILE IRR 1000ML POUR (IV SOLUTION) IMPLANT

## 2012-03-13 NOTE — Transfer of Care (Signed)
Immediate Anesthesia Transfer of Care Note  Patient: Gregory Friedman  Procedure(s) Performed: Procedure(s): RADICAL NECK DISSECTION (Left) EXTRACTION MOLARS (N/A) EXCISION LEFT BUCCAL MUCOSA (N/A)  Patient Location: PACU  Anesthesia Type:General  Level of Consciousness: patient cooperative, lethargic and responds to stimulation  Airway & Oxygen Therapy: Patient Spontanous Breathing and Patient connected to nasal cannula oxygen  Post-op Assessment: Report given to PACU RN  Post vital signs: Reviewed and stable  Complications: No apparent anesthesia complications

## 2012-03-13 NOTE — Brief Op Note (Signed)
03/13/2012  1:17 PM  PATIENT:  Gregory Friedman  62 y.o. male  PRE-OPERATIVE DIAGNOSIS:  LEFT BUCCAL SQUAMOUS CELL CARCINOMA  POST-OPERATIVE DIAGNOSIS:  LEFT BUCCAL SQUAMOUS CELL CARCINOMA  PROCEDURE:  Procedure(s): RADICAL NECK DISSECTION (Left) EXTRACTION MOLARS (N/A) EXCISION LEFT BUCCAL MUCOSA (N/A)  SURGEON:  Surgeon(s) and Role:    * Drema Halon, MD - Primary    * Keturah Barre, MD - Assisting  PHYSICIAN ASSISTANT:   ASSISTANTS: none   ANESTHESIA:   general  EBL:  Total I/O In: 1000 [I.V.:1000] Out: 600 [Urine:525; Blood:75]  BLOOD ADMINISTERED:none  DRAINS: (11) Jackson-Pratt drain(s) with closed bulb suction in the left neck   LOCAL MEDICATIONS USED:  NONE  SPECIMEN:  Source of Specimen:  left buccal ca,left submandibular nodes and gland,left anterior neck,left posterior ncek  DISPOSITION OF SPECIMEN:  PATHOLOGY  COUNTS:  YES  TOURNIQUET:  * No tourniquets in log *  DICTATION: .Other Dictation: Dictation Number [id number]  PLAN OF CARE: Admit to inpatient   PATIENT DISPOSITION:  PACU - hemodynamically stable.   Delay start of Pharmacological VTE agent (>24hrs) due to surgical blood loss or risk of bleeding: yes

## 2012-03-13 NOTE — Progress Notes (Signed)
Post op check Awake alert    VSS Neck wound flat with moderate serosanguinous drainage in JP 25cc Significant weakness of lower lip on left Stable post op course

## 2012-03-13 NOTE — Anesthesia Procedure Notes (Signed)
Procedure Name: Intubation Date/Time: 03/13/2012 8:43 AM Performed by: Jefm Miles E Pre-anesthesia Checklist: Patient identified, Timeout performed, Emergency Drugs available, Suction available and Patient being monitored Patient Re-evaluated:Patient Re-evaluated prior to inductionOxygen Delivery Method: Circle system utilized Preoxygenation: Pre-oxygenation with 100% oxygen Intubation Type: IV induction Ventilation: Mask ventilation without difficulty Laryngoscope Size: Mac and 3 Grade View: Grade II Tube type: Oral Tube size: 7.5 mm Number of attempts: 1 Airway Equipment and Method: Stylet Placement Confirmation: ETT inserted through vocal cords under direct vision,  breath sounds checked- equal and bilateral and positive ETCO2 Secured at: 22 cm Tube secured with: Tape Dental Injury: Teeth and Oropharynx as per pre-operative assessment

## 2012-03-13 NOTE — Interval H&P Note (Signed)
History and Physical Interval Note:  03/13/2012 8:26 AM  Gregory Friedman  has presented today for surgery, with the diagnosis of LEFT BUCCAL SQUAMOUS CELL CARCINOMA  The various methods of treatment have been discussed with the patient and family. After consideration of risks, benefits and other options for treatment, the patient has consented to  Procedure(s) with comments: RADICAL NECK DISSECTION (Left) - Left Neck Dissection, Removal of Buccal Squamous Cell Carcinoma as a surgical intervention .  The patient's history has been reviewed, patient examined, no change in status, stable for surgery.  I have reviewed the patient's chart and labs.  Questions were answered to the patient's satisfaction.     NEWMAN, CHRISTOPHER

## 2012-03-13 NOTE — Progress Notes (Signed)
Report to Haze Boyden RN as primary caregiver.

## 2012-03-13 NOTE — Anesthesia Preprocedure Evaluation (Addendum)
Anesthesia Evaluation  Patient identified by MRN, date of birth, ID band Patient awake    Reviewed: Allergy & Precautions, H&P , NPO status , Patient's Chart, lab work & pertinent test results  Airway Mallampati: II TM Distance: >3 FB Neck ROM: full    Dental  (+) Teeth Intact and Missing,    Pulmonary  breath sounds clear to auscultation        Cardiovascular hypertension, Pt. on home beta blockers + CAD, + Past MI and + CABG Rhythm:Regular Rate:Bradycardia     Neuro/Psych Anxiety Depression    GI/Hepatic GERD-  ,  Endo/Other  diabetes, Well Controlled, Type 2  Renal/GU      Musculoskeletal  (+) Arthritis -,   Abdominal   Peds  Hematology   Anesthesia Other Findings   Reproductive/Obstetrics                          Anesthesia Physical Anesthesia Plan  ASA: III  Anesthesia Plan: General   Post-op Pain Management:    Induction: Intravenous  Airway Management Planned: Oral ETT  Additional Equipment:   Intra-op Plan:   Post-operative Plan: Extubation in OR  Informed Consent: I have reviewed the patients History and Physical, chart, labs and discussed the procedure including the risks, benefits and alternatives for the proposed anesthesia with the patient or authorized representative who has indicated his/her understanding and acceptance.     Plan Discussed with: CRNA and Surgeon  Anesthesia Plan Comments:         Anesthesia Quick Evaluation

## 2012-03-13 NOTE — Preoperative (Signed)
Beta Blockers   Reason not to administer Beta Blockers:Not Applicable, metoprolol 03/12/12 at 20:30 pm

## 2012-03-13 NOTE — Anesthesia Postprocedure Evaluation (Signed)
Anesthesia Post Note  Patient: Gregory Friedman  Procedure(s) Performed: Procedure(s) (LRB): RADICAL NECK DISSECTION (Left) EXTRACTION MOLARS (N/A) EXCISION LEFT BUCCAL MUCOSA (N/A)  Anesthesia type: General  Patient location: PACU  Post pain: Pain level controlled and Adequate analgesia  Post assessment: Post-op Vital signs reviewed, Patient's Cardiovascular Status Stable, Respiratory Function Stable, Patent Airway and Pain level controlled  Last Vitals:  Filed Vitals:   03/13/12 1500  BP: 159/74  Pulse: 87  Temp:   Resp: 13    Post vital signs: Reviewed and stable  Level of consciousness: awake, alert  and oriented  Complications: No apparent anesthesia complications

## 2012-03-14 ENCOUNTER — Encounter: Payer: Medicare HMO | Admitting: Family Medicine

## 2012-03-14 LAB — GLUCOSE, CAPILLARY
Glucose-Capillary: 156 mg/dL — ABNORMAL HIGH (ref 70–99)
Glucose-Capillary: 196 mg/dL — ABNORMAL HIGH (ref 70–99)

## 2012-03-14 LAB — BASIC METABOLIC PANEL
CO2: 29 mEq/L (ref 19–32)
Glucose, Bld: 176 mg/dL — ABNORMAL HIGH (ref 70–99)
Potassium: 3.6 mEq/L (ref 3.5–5.1)
Sodium: 141 mEq/L (ref 135–145)

## 2012-03-14 MED ORDER — LINAGLIPTIN 5 MG PO TABS
5.0000 mg | ORAL_TABLET | Freq: Every day | ORAL | Status: DC
  Administered 2012-03-14 – 2012-03-15 (×2): 5 mg via ORAL
  Filled 2012-03-14 (×2): qty 1

## 2012-03-14 NOTE — Progress Notes (Signed)
POD 1 VSS AF Neck wound doing well without swelling JP output yesterday 90cc,  Est. 20cc in JP this am. Will plan on removing JP neck drain tomorrow and possible discharge. OK pos. No bleeding. Stable post op course. Possible discharge tomorrow. Encourage increase activity today.

## 2012-03-14 NOTE — Op Note (Signed)
NAME:  Gregory, Friedman NO.:  [id number]  MEDICAL RECORD NO.:  [id number]  LOCATION:  [id number]                        FACILITY:  [facility]  PHYSICIAN:  Kristine Garbe. 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  OPERATION PERFORMED:  Wide excision of left buccal mucosa squamous cell carcinoma with left modified neck dissection, extraction of tooth #16.  SURGEON:  Kristine Garbe. Ezzard Standing, M.D.  ASSISTANT SURGEON:  Hermelinda Medicus, M.D.  ANESTHESIA:  General endotracheal.  ESTIMATED BLOOD LOSS:  150 mL.  COMPLICATIONS:  None.  BRIEF CLINICAL NOTE:  Gregory Friedman is a 62 year old gentleman, who had a chronic sore in the left buccal mucosa for couple of months now been followed by his dentist.  He was subsequently referred to oral surgeon, Dr. Egbert Friedman, who performed a biopsy of left buccal mucosal ulcer 3 weeks ago, which showed invasive squamous cell carcinoma.  He has had further workup with a CT scan as well as a PET scan, which shows the buccal cancer as well as one positive node that is necrotic along the ramus of the mandible just above the submandibular gland on the left side.  He has a few other enlarged lymph nodes in the left neck, but they did not light up on PET scan.  He has also had some chronic pain in tooth #16, which has been followed by the dentist.  He was taken to the operating room at this time for wide excision of left buccal squamous cell carcinoma with a left neck dissection and we will also plan of removal of the tooth #16, which has been giving him chronic pain.   The cancer resides in the soft tissue on the buccal mucosa, centered around the region of the parotid duct.  It extends up to the buccal alveolar groove, but really does not extend onto the alveolar bone.  The necrotic node on the submandibular node is in the region of the marginal mandibular nerve and I discussed with him prior to surgery concerning a probable weakness of the marginal nerve.  This tumor has increased in size since I initially saw him about 2 weeks ago.  He and his wife understand the surgery and risks, and will probably need postoperative radiation therapy for treatment of his cancer.  DESCRIPTION OF PROCEDURE:  After adequate endotracheal anesthesia, the patient received 2 g of Ancef IV preoperatively.  First, the mouth was exposed and the buccal cancer was exposed.  The cancer measured approximately 3 cm in size.  It extended up toward the buccal alveolar groove superiorly and posteriorly back toward the retromolar trigone, but really did not involve the retromolar trigone area.  Using cautery, I excised the cancer with what appeared to be relatively clear mucosal margins.  I estimated these margins to be ranged between 6-10 mm in size.  The cancer was removed off of the masseter muscle.  Excision included through the buccal mucosa into the fat region or parapharyngeal fat region.  After removal of the cancer, it was marked with short suture at 12 o'clock, a medium size suture at 3 o'clock, and a long suture at 6 o'clock and sent to Pathology to check margins.  Hemostasis was obtained with a cautery.  After obtaining adequate hemostasis a wet sponge was placed in the mouth.  Report from pathology during the neck dissection revealed a very close or positive margin at the 12 o'clock region and a fresh 1-2 mm of mucosa was excised from this area, which basically extended on to the alveolar bone region and this was sent as a separate specimen at the end the case, as  superior or 12 o'clock margin. Next, the neck dissection was performed.  A standard horizontal incision was made down through a horizontal crease in the neck, along with the small vertical limb posteriorly.  Subplatysmal flaps were elevated inferiorly, posteriorly, and superiorly up over the firm submandibular node.  A few branches of the marginal nerve were identified and it could be dissected off of the large submandibular node, however, there was 1 prominent branch of the submandibular nerve that was basically adjacent to the node and did not dissect this larger branch off of the obvious tumor.  The node was dissected off of the mandible down to the periosteum.  This included some excision of the masseter muscle deep to the node.  Next, the submandibular gland was dissected out of the submandibular fossa.  There was 1 small or additional node just inferior to the large positive node which ran adjacent or between the submandibular gland in the large node.  The branch of the lingual nerve was identified and ligated with a 2-0 silk suture.  A submandibular duct was ligated with a 2-0 silk suture.  The submandibular gland was then dissected out of the submandibular fossa along with the large necrotic node just superior to the submandibular gland.  During excision of the necrotic nodes, the facial artery had to be sacrificed as it went right adjacent to the cancer.  The superior aspect of the facial artery was ligated with a 2-0 silk suture x2, and the inferior portion of the facial artery likewise was ligated along with the facial vein.  This completed the submandibular dissection and this was then sent off as submandibular gland and submandibular lymph nodes as a separate specimen.  Next, dissection was carried out along the jugular vein and jugular nodes.  The posterior belly digastric muscle was dissected out posterosuperiorly and the jugular vein and carotid artery and vagus nerve were  identified superiorly as well as the 11th nerve or sensory nerve likewise was identified and preserved throughout the dissection. There was 1 slightly enlarged node superiorly along the jugular vein, and dissection was carried out just superior to this slightly enlarged node.  The lymph nodes and soft tissue around the carotid artery and jugular vein were then dissected off inferiorly down to the level of the omohyoid muscle.  In addition, there was some tissue in the posterior neck along the accessory chain of lymph nodes.  There was 1 slightly enlarged node along the accessory chain of lymph nodes just posterior and inferior to the sternocleidomastoid muscle along the accessory nerve, which was dissected out to include some specimen from the accessory chain of lymph nodes.  This was marked as the posterior neck dissection of accessory nerve lymph nodes.  This completed the dissection, there  were several branches of the facial vein that were ligated with 3-0 silk ligatures.  The 11th nerve was preserved throughout as was the jugular vein and vagus nerve.  After obtaining adequate hemostasis, the wound was irrigated with saline.  A Al Pimple drain was brought out through a separate stab incision inferiorly in the neck and placed along the neck and superior up into the submandibular area.  Neck wound was closed with 3-0 chromic sutures subcutaneously and staples reapproximated with skin edges.  Following the neck dissection as mentioned previously, an additional excision of mucosa was obtained from the superior or 12 o'clock position of the buccal cancer intraorally.  Following excision of this additional mucosa, the area was cauterized.  This completed the procedure.  The patient was subsequently awoke from anesthesia and transferred to recovery room, postop doing well.  We will plan admission for 2 or 3 days with postoperative antibiotic Ancef.           ______________________________ Kristine Garbe. Ezzard Standing, M.D.     CEN/MEDQ  D:  03/13/2012  T:  03/14/2012  Job:  [id number]  cc:   Hermelinda Medicus, M.D.

## 2012-03-15 ENCOUNTER — Encounter (HOSPITAL_COMMUNITY): Payer: Self-pay | Admitting: Otolaryngology

## 2012-03-15 MED ORDER — METFORMIN HCL ER 500 MG PO TB24
1000.0000 mg | ORAL_TABLET | Freq: Every day | ORAL | Status: DC
  Filled 2012-03-15 (×2): qty 2

## 2012-03-15 MED ORDER — HYDROCODONE-ACETAMINOPHEN 7.5-325 MG/15ML PO SOLN: 10.0000 mL | Freq: Four times a day (QID) | ORAL | Status: AC | PRN

## 2012-03-15 MED ORDER — AMOXICILLIN 400 MG/5ML PO SUSR: 600.0000 mg | Freq: Two times a day (BID) | ORAL | Status: AC

## 2012-03-15 NOTE — Progress Notes (Signed)
POD 2 AF VSS Neck wound doing well. JP output minimal and serous and was removed. Tolerating pos ok. Moderate pain. No bleeding Plan discharge today on Amoxicillin 600 mg BID and hydrocodone prn pain along with his regular meds Follow up in 4 days Discharge dictated    [id number]

## 2012-03-16 NOTE — Discharge Summary (Signed)
NAME:  Gregory Friedman, Gregory Friedman NO.:  [id number]  MEDICAL RECORD NO.:  [id number]  LOCATION:  [id number]                        FACILITY:  [facility]  PHYSICIAN:  Kristine Garbe. Ezzard Standing, M.D.DATE OF BIRTH:  03/24/1950  DATE OF ADMISSION:  03/13/2012 DATE OF DISCHARGE:  03/15/2012                              DISCHARGE SUMMARY   DIAGNOSES: 1. 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 2. Non-insulin-dependent diabetic. 3. History of hypertension.  OPERATIONS DURING THIS HOSPITALIZATION:  Wide excision of left buccal squamous cell carcinoma with extraction of tooth #16 and left modified neck dissection on March 13, 2012.  HOSPITAL COURSE:  The patient was admitted via the operating room on March 13, 2012, at which time, he underwent a wide excision of a large left buccal squamous cell carcinoma and a left modified neck dissection.  He tolerated this well, received perioperative antibiotics Ancef and was admitted postoperatively to the surgical floor.  His Al Pimple drain was removed on the second postoperative day after output had reduced adequately and was very serous.  At time of discharge, neck wound was doing well.  He had minimal swelling.  He had no substantial bleeding and had adequate p.o. intake.  On the second postoperative day, the JP drain was removed and patient was discharged home on amoxicillin suspension 600 mg b.i.d., Tylenol, Motrin, __________ every 4 hours p.r.n. pain and follow up my office in 4 days for recheck and removal of the staples.  FINAL DIAGNOSES: 1. 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 2. Non-insulin dependent diabetic. 3. History of hypertension.          ______________________________ Kristine Garbe Ezzard Standing, M.D.     CEN/MEDQ  D:  03/15/2012  T:  03/15/2012  Job:  [id number]

## 2012-03-18 ENCOUNTER — Other Ambulatory Visit: Payer: Self-pay | Admitting: Radiation Oncology

## 2012-03-18 DIAGNOSIS — C060 Malignant neoplasm of cheek mucosa: Secondary | ICD-10-CM

## 2012-03-21 ENCOUNTER — Telehealth: Payer: Self-pay | Admitting: *Deleted

## 2012-03-21 NOTE — Telephone Encounter (Signed)
Talked with Ms. Gregory Friedman on yesterday.  She voiced concerns that her husband was still experieincing soret throat with some difficulty swallowing post procedure

## 2012-03-21 NOTE — Telephone Encounter (Signed)
Talked with Ms. Stitely on yesterday because of concerns related to Gregory Friedman's continued sore throat and difficulty swallowing since his wide excision of left buccal squamous cell carcinoma and left modified neck dissection on March 13, 2012.  She was advised at this time to call Dr. Ezzard Standing because he may need to be reassessed even though he had been since earlier this week. She and her spouse decided to wait.  Called today and she stated his swallowing is better, but she stated concerns that her husband may be "depressed".  Informed her of the cancer center resources and she "felt" Mr. Wages would be open to seeing one of the [facility] social workers.  His next appt. Is on 03/27/12 for a reassessment.  Will notify SW of potential need.

## 2012-03-22 ENCOUNTER — Encounter: Payer: Self-pay | Admitting: *Deleted

## 2012-03-22 NOTE — Progress Notes (Signed)
[facility] Psychosocial Distress Screening Clinical Social Work  Clinical Social Work was referred by distress screening protocol.  The patient scored a 5 on the Psychosocial Distress Thermometer which indicates moderate distress. Clinical Social Worker attempted to contact patient to assess for distress and other psychosocial needs. CSW left voicemail for patient to return call when convenient.   Clinical Social Worker follow up needed: yes  If yes, follow up plan:  CSW aware of upcoming appointment on 03/27/12, CSW will attempt to meet with patient at this appointment.  Kathrin Penner, MSW, LCSW Clinical Social Worker [facility] [phone]

## 2012-03-27 ENCOUNTER — Ambulatory Visit
Admission: RE | Admit: 2012-03-27 | Discharge: 2012-03-27 | Disposition: A | Discharge status: Home / Self Care | Payer: Medicare HMO | Source: Ambulatory Visit | Attending: Radiation Oncology | Admitting: Radiation Oncology

## 2012-03-27 ENCOUNTER — Encounter: Payer: Self-pay | Admitting: Radiation Oncology

## 2012-03-27 ENCOUNTER — Encounter: Payer: Self-pay | Admitting: *Deleted

## 2012-03-27 VITALS — BP 152/82 | HR 56 | Temp 98.6°F | Resp 20 | Wt 188.3 lb

## 2012-03-27 DIAGNOSIS — C060 Malignant neoplasm of cheek mucosa: Secondary | ICD-10-CM

## 2012-03-27 DIAGNOSIS — C770 Secondary and unspecified malignant neoplasm of lymph nodes of head, face and neck: Secondary | ICD-10-CM | POA: Insufficient documentation

## 2012-03-27 NOTE — Progress Notes (Addendum)
Wife w/pt today. Pt reports minor pain in his left cheek, upper jaw where he had molar extracted. He states Dr Ezzard Standing drained a gland in his left cheek which releived much of his pain.  He was taking Ibuprofen w/little relief. He began taking Celebrex yesterday w/good relief. He is exercising jaw, able to open mouth wider w/taking Celebrex, takes small bites when eating.  Pt denies other issues w/chewing, eating, swallowing. Pt denies loss of appetite, fatigue comes and goes, reports depression at times, difficulty sleeping. Pt has taken Melatonin but does not like after effects the next day. Discussed reducing dose, trying dark cherry juice.  Will notify Kathrin Penner, SW that pt is here today. He would like to see her.  9:01 am left vm for L Mullis, SW re: pt in this office for 30 min appt w/Dr Basilio Cairo; pt would like to see her today.

## 2012-03-27 NOTE — Progress Notes (Signed)
Please see the Nurse Progress Note in the MD Initial Consult Encounter for this patient. 

## 2012-03-27 NOTE — Progress Notes (Signed)
[facility] Comprehensive Psychosocial Assessment Clinical Social Work  Clinical Social Work was referred by radiation oncology, RN, and distress screening protocol.  Clinical Social Worker met with patient and patient's spouse to assess psychosocial, emotional, mental health, and spiritual needs of the patient.  Patient's knowledge about cancer and its treatment including level of understanding, reactions, goals for care, and expectations:  Gregory Friedman and spouse report feeling very aware of current plan and are eager to meet with dentistry for clearance and complete simulation.  Patient states they believe the plan is concurrent chemoradiation.  Patient's spouse reports patient has a very positive outlook, however, he often cries and "feels low".  CSW validated patients feelings and discussed common emotional reactions to cancer diagnosis.  CSW and patient processed patients current reactions and discussed difficulty coping with cancer, signs of depression, and difficulty sleeping. CSW encouraged patient or spouse to notify CSW if he finds himself "feeling low" more often.  CSW, patient, and spouse discussed possible resources for additional support at [facility] such as supportive counseling, [facility] programs, and possible "matching" patient with a survivor. CSW also encouraged patient's spouse to utilize support services.  Characteristics of the patient's support system:  [name] indicates large family support. He was accompanied by his spouse at today's visit.  He reports having several children and grandchildren that live in [place] and [place].     Patient and family psychosocial functioning including strengths, limitations, and coping skills:  The patient has many positive strengths to help support him through his cancer journey.  CSW identified his spouse as a large support.  [name] displays a positive outlook and shares he believes "he will use this experience to learn from".  CSW  also identifies patient's ability to process emotional concerns and receive guidance as a strength.  The patient identifies his sense of humor as a coping mechanism and enjoys making people laugh.  Identifications of barriers to care: none identified at this time  Availability of community resources: [facility] counseling services, support programs  Clinical Social Worker follow up needed: no  If yes, follow up plan: CSW encouraged patient or spouse to contact CSW with any additional questions or concerns.  CSW does not identify any urgent needs/concerns at this time, but will continue to follow patient throughout treatment.  Kathrin Penner, MSW, LCSW Clinical Social Worker [facility] [phone]

## 2012-03-27 NOTE — Progress Notes (Signed)
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 PET scan on 03/08/12  Showed hypermetabolic activity in the left oral cavity primary site, plus a solitary metastasis to a left submandibular lymph node. No other metastatic disease identified. Mediastinal nodes were not hypermetabolic.  He underwent surgery on 03/13/12, path revealed close margins  by 0.2cm, +LVSI, no PNI, and three positive nodes, extracapsular extension:  Diagnosis 1. Oral Resection, Left Buccal mucosa - INVASIVE SQUAMOUS CELL CARCINOMA, 2.0 CM, INVADING INTO THE UNDERLYING MUSCLE. - ONE SUBMUCOSAL LYMPH NODE, POSITIVE FOR METASTATIC CARCINOMA (1/1). - RESECTION MARGINS, NEGATIVE FOR MALIGNANCY. - PLEASE SEE ONCOLOGY TEMPLATE FOR DETAILS. 2. Submandibular gland, and nodes - TWO OF FOUR LYMPH NODES, POSITIVE FOR METASTATIC CARCINOMA WITH EXTRACAPSULAR EXTENSION (2/4; LARGEST NODE: 2.2 CM). 3. Lymph nodes, regional resection, Jugular chain - UPPER 1/3: THREE LYMPH NODES, NEGATIVE FOR METASTATIC CARCINOMA (0/3). - MID 1/3: TWO LYMPH NODES, NEGATIVE FOR METASTATIC CARCINOMA (0/2). - LOWER 1/3: TWO LYMPH NODES, NEGATIVE FOR METASTATIC CARCINOMA (0/2). 4. Lymph nodes, regional resection, Accessory - FOURTEEN LYMPH NODES, NEGATIVE FOR METASTATIC CARCINOMA (0/14). 5. Buccal mucosa, biopsy, Left 12 o'clock - BENIGN SQUAMOUS MUCOSA, NO EVIDENCE OF DYSPLASIA OR MALIGNANCY. Microscopic Comment 1. ONCOLOGY TABLE - LIP AND ORAL CAVITY 1. Specimen, including laterality if applicable: Left buccal mucosa 2. Procedure: Resection 3. Tumor site: Buccal mucosa, left 4. Tumor focality: Unifocal 5. Maximum tumor size  (cm): 2.0 cm 6. Histologic type: Invasive squamous cell carcinoma 7. Grade: G1, well differentiated 8. Margins: Negative 9. Closest margin: 3:00 position Distance from the closest margin ( cm): 0.2 cm 10. Microscopic tumor extension: Invading into underlying muscle 11. Lymph-vascular invasion: Present 1 of 3 FINAL for Gregory Friedman ([id number]) Microscopic Comment(continued) 12. Perineural invasion: Not identified 13. Lymph nodes: # examined: 26; # positive: 3, with extracapsular extension, largest one: 2.2 cm 14. TNM code: pT1; pN2b   He is recovering fairly well. He has some postoperative pain as well as weakness of the left lower lip. He is able to chew food on the right side of his mouth.                         ALLERGIES:  has No Known Allergies.  Meds: Current Outpatient Prescriptions  Medication Sig Dispense Refill  . ALOE PO Take 20 mg by mouth daily.       . celecoxib (CELEBREX) 200 MG capsule Take 200 mg by mouth 2 (two) times daily.      . enalapril (VASOTEC) 10 MG tablet Take 10 mg by mouth 2 (two) times daily.      [name] Hyaluronic Acid-Vitamin C (HYALURONIC ACID PO) Take 80 mg by mouth daily. Takes 1 tablet daily      . ibuprofen (ADVIL,MOTRIN) 200 MG tablet Take 200 mg by mouth every 6 (six) hours as needed for pain. For pain      . metoprolol succinate (TOPROL-XL) 25 MG 24 hr tablet Take 25 mg by mouth at bedtime.      . nitroGLYCERIN (NITROSTAT) 0.4 MG SL tablet Place 1 tablet (0.4 mg total) under the tongue every 5 (five) minutes as  needed. Chest pain  25 tablet  1  . omeprazole (PRILOSEC) 40 MG capsule       . OVER THE COUNTER MEDICATION Take 1 tablet by mouth daily.       . Saw Palmetto, Serenoa repens, 320 MG CAPS Take 1 capsule by mouth daily.      . Saxagliptin-Metformin 05-998 MG TB24 Take 1 tablet by mouth daily.      . simvastatin (ZOCOR) 40 MG tablet Take 1 tablet (40 mg total) by mouth at bedtime.  90 tablet  0  . Vitamins C E (VITAMIN C & E COMPLEX)  500-400 MG-UNIT CAPS Take 1 capsule by mouth daily.       No current facility-administered medications for this encounter.    Physical Findings: The patient is in no acute distress. Patient is alert and oriented.  weight is 188 lb 4.8 oz (85.412 kg). His oral temperature is 98.6 F (37 C). His blood pressure is 152/82 and his pulse is 56. His respiration is 20. [name]  Postoperative changes in the left oral cavity/ buccal mucosa, still healing. No active bleeding or signs of infection. Modest postoperative swelling in the left neck, scars healing well  Lab Findings: Lab Results  Component Value Date   WBC 7.2 03/12/2012   HGB 13.6 03/12/2012   HCT 40.5 03/12/2012   MCV 82.1 03/12/2012   PLT 165 03/12/2012    CMP     Component Value Date/Time   NA 141 03/14/2012 1000   NA 142 03/12/2012 0955   K 3.6 03/14/2012 1000   K 4.0 03/12/2012 0955   CL 102 03/14/2012 1000   CL 105 03/12/2012 0955   CO2 29 03/14/2012 1000   CO2 28 03/12/2012 0955   GLUCOSE 176* 03/14/2012 1000   GLUCOSE 177* 03/12/2012 0955   BUN 10 03/14/2012 1000   BUN 16.3 03/12/2012 0955   CREATININE 1.23 03/14/2012 1000   CREATININE 1.5* 03/12/2012 0955   CREATININE 1.23 12/04/2011 0921   CALCIUM 8.8 03/14/2012 1000   CALCIUM 10.0 03/12/2012 0955   PROT 7.9 03/12/2012 0955   PROT 8.1 03/04/2012 1455   ALBUMIN 4.0 03/12/2012 0955   ALBUMIN 4.9 03/04/2012 1455   AST 23 03/12/2012 0955   AST 24 03/04/2012 1455   ALT 31 03/12/2012 0955   ALT 28 03/04/2012 1455   ALKPHOS 77 03/12/2012 0955   ALKPHOS 59 03/04/2012 1455   BILITOT 0.51 03/12/2012 0955   BILITOT 0.5 03/04/2012 1455   GFRNONAA 61* 03/14/2012 1000   GFRAA 71* 03/14/2012 1000     Radiographic Findings: Ct Soft Tissue Neck W Contrast  03/05/2012  *RADIOLOGY REPORT*  Clinical Data:  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 Question enlarged lymph node.  BUN and creatinine were obtained on site at [facility] at [address] Results:  BUN 15 mg/dL,  Creatinine 1.1  mg/dL.  CT MAXILLOFACIAL WITH CONTRAST  Technique:  Multidetector CT imaging of the maxillofacial structure s was performed with intravenous contrast. Multiplanar CT image reconstructions were als o generated.  A small metallic BB was placed on the right temple in order to reliably differentiate right from left.  Contrast: 75mL OMNIPAQUE IOHEXOL 300 MG/ML  SOLN  Comparison:   None.  Findings:  Based discrete skin mass is not evident to adjacent to the area marked.  The paranasal sinuses and mastoid air cells are clear.  Limited imaging of the brain is unremarkable.  A heterogeneous peripherally enhancing left submandibular lymph node measures  17 x 15 x 17 mm.  There are two additional left submandibular lymph nodes measuring 5 and 7 mm respectively.  The largest node is highly worsened metastatic disease.  This raises concern in the other two nodes as well.  A smaller ovoid node is present in the right submandibular station.  There is thickening of the left platysma muscle and extension into the left face.  The bone windows demonstrate no focal lytic or blastic lesion to suggest metastatic disease.  IMPRESSION:  1.  Metastatic left submandibular lymph nodes.  The largest measures 17 x 15 x 17 mm with peripheral enhancement and central necrosis. 2.  Two additional smaller left centimeter lymph nodes are also worrisome for metastatic disease. 3.  Inflammatory changes of the left face and submandibular neck with thickening of the platysma. 4.  The primary lesion is not well visualized by CT.  *RADIOLOGY REPORT*  CT NECK WITH CONTRAST  Technique:  Multidetector CT imaging of the neck was performed using the standard protocol following the bolus administration of intravenous contrast.  Contrast: 75mL OMNIPAQUE IOHEXOL 300 MG/ML  SOLN  Findings: No significant mucosal or submucosal lesions are present in the remainder the neck.  Vocal cords are midline and symmetric. The thyroid is within normal limits.  A 15 x 10 mm high  right paratracheal lymph node is present.  The adjacent node measures 7 mm in short axis.  A prominent left jugulodigastric lymph node measures 25 mm in long axis but is thin and ovoid.  There is a somewhat rounded more anterior left level II lymph node which measures 8 x 13 mm and is more suspicious for metastatic disease.  Borderline posterior left level II and level III lymph nodes are present as well.  Scattered right cervical lymph nodes appear benign.  The lung apices are clear apart from mild atelectasis.  Patient is status post median sternotomy for CABG.  Mild degenerative changes are present in the cervical spine.  No focal lytic or blastic lesions are present.  IMPRESSION:  1.  17 mm left submandibular lymph node is highly worsened for metastatic disease. 2.  25 mm left jugulodigastric lymph node is otherwise very narrow and likely benign. 3.  A more rounded 13 mm anterior left level II lymph node is more suspicious for metastatic disease. 4.  Additional borderline left level II level III lymph nodes are present. 5.  No significant right-sided cervical adenopathy. 6.  No primary mucosal lesion is evident. 7.  Mild degenerative changes of the cervical spine are evident without evidence for metastatic disease. 8.  Enlarged high right paratracheal lymph node raises the possibility of distant metastases.   Original Report Authenticated By: Marin Roberts, M.D.    Nm Pet Image Initial (pi) Skull Base To Thigh  03/08/2012  *RADIOLOGY REPORT*  Clinical Data: Initial treatment strategy for squamous cell carcinoma of the buccal mucosa.  Cervical adenopathy on CT.  NUCLEAR MEDICINE PET SKULL BASE TO THIGH  Fasting Blood Glucose:  131  Technique:  19.0 mCi F-18 FDG was injected intravenously. CT data was obtained and used for attenuation correction and anatomic localization only.  (This was not acquired as a diagnostic CT examination.) Additional exam technical data entered on technologist worksheet.   Comparison:  CTs of the face and neck 03/05/2012.  Findings:  Neck: A separate acquisition of the head and neck was obtained. There is focal hypermetabolic activity in the left maxillary region adjacent to the molars, presumably corresponding with the primary  malignancy within the oral cavity.  This has an SUV max of 16.7. The previously demonstrated dominant left submandibular lymph node is also hypermetabolic.  This has an SUV max of 13.7 and measures 1.9 x 1.6 cm on image 39.  There is no other hypermetabolic cervical lymphadenopathy.  No other lesions of the pharyngeal mucosal space are identified.  Chest:  There is no hypermetabolic mediastinal, hilar or axillary adenopathy.  There is no abnormal pulmonary activity.  The lungs are clear aside from mild dependent atelectasis bilaterally.  There are small high right paratracheal lymph nodes which are not hypermetabolic.  Diffuse atherosclerosis is noted status post CABG.  Abdomen/Pelvis:  There is no hypermetabolic activity within the liver, adrenal glands, spleen or pancreas.  There are no hypermetabolic abdominal pelvic lymph nodes.  Small gallstones, atherosclerosis and mild lumbar spondylosis are noted.  Skeleton:  No focal hypermetabolic activity to suggest skeletal metastasis.  IMPRESSION:  1.  Hypermetabolic activity in the left maxillary alveolar ridge, presumably related to known primary buccal squamous cell carcinoma. 2.  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  No other metastatic disease identified. 3.  Cholelithiasis and atherosclerosis noted.   Original Report Authenticated By: Carey Bullocks, M.D.    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 Question enlarged lymph node.  BUN and creatinine were obtained on site at [facility] at [address] Results:  BUN 15 mg/dL,  Creatinine 1.1 mg/dL.  CT MAXILLOFACIAL WITH CONTRAST  Technique:   Multidetector CT imaging of the maxillofacial structure s was performed with intravenous contrast. Multiplanar CT image reconstructions were als o generated.  A small metallic BB was placed on the right temple in order to reliably differentiate right from left.  Contrast: 75mL OMNIPAQUE IOHEXOL 300 MG/ML  SOLN  Comparison:   None.  Findings:  Based discrete skin mass is not evident to adjacent to the area marked.  The paranasal sinuses and mastoid air cells are clear.  Limited imaging of the brain is unremarkable.  A heterogeneous peripherally enhancing left submandibular lymph node measures 17 x 15 x 17 mm.  There are two additional left submandibular lymph nodes measuring 5 and 7 mm respectively.  The largest node is highly worsened metastatic disease.  This raises concern in the other two nodes as well.  A smaller ovoid node is present in the right submandibular station.  There is thickening of the left platysma muscle and extension into the left face.  The bone windows demonstrate no focal lytic or blastic lesion to suggest metastatic disease.  IMPRESSION:  1.  Metastatic left submandibular lymph nodes.  The largest measures 17 x 15 x 17 mm with peripheral enhancement and central necrosis. 2.  Two additional smaller left centimeter lymph nodes are also worrisome for metastatic disease. 3.  Inflammatory changes of the left face and submandibular neck with thickening of the platysma. 4.  The primary lesion is not well visualized by CT.  *RADIOLOGY REPORT*  CT NECK WITH CONTRAST  Technique:  Multidetector CT imaging of the neck was performed using the standard protocol following the bolus administration of intravenous contrast.  Contrast: 75mL OMNIPAQUE IOHEXOL 300 MG/ML  SOLN  Findings: No significant mucosal or submucosal lesions are present in the remainder the neck.  Vocal cords are midline and symmetric. The thyroid is within normal limits.  A 15 x 10 mm high right paratracheal lymph node is present.  The  adjacent node measures 7 mm in short axis.  A prominent left jugulodigastric lymph node measures 25 mm in long axis but is thin and ovoid.  There is a somewhat rounded more anterior left level II lymph node which measures 8 x 13 mm and is more suspicious for metastatic disease.  Borderline posterior left level II and level III lymph nodes are present as well.  Scattered right cervical lymph nodes appear benign.  The lung apices are clear apart from mild atelectasis.  Patient is status post median sternotomy for CABG.  Mild degenerative changes are present in the cervical spine.  No focal lytic or blastic lesions are present.  IMPRESSION:  1.  17 mm left submandibular lymph node is highly worsened for metastatic disease. 2.  25 mm left jugulodigastric lymph node is otherwise very narrow and likely benign. 3.  A more rounded 13 mm anterior left level II lymph node is more suspicious for metastatic disease. 4.  Additional borderline left level II level III lymph nodes are present. 5.  No significant right-sided cervical adenopathy. 6.  No primary mucosal lesion is evident. 7.  Mild degenerative changes of the cervical spine are evident without evidence for metastatic disease. 8.  Enlarged high right paratracheal lymph node raises the possibility of distant metastases.   Original Report Authenticated By: Marin Roberts, M.D.     Impression/Plan:    1) Head and Neck Cancer Status: Postoperative, healing  2) Nutritional Status: - weight: Patient and I discussed that his weight may fall during radiotherapy. I recommended eating high calorie foods for now. Will refer him to nutritionist - PEG tube: Appointment will be made for PEG tube placement for nutritional maintenance as the patient understands that the side effects from chemoradiotherapy will make it difficult to eat during treatment  3) Risk Factors: The patient has been educated about risk factors including alcohol and tobacco abuse; they understand  that avoidance of alcohol and tobacco is important to prevent recurrences as well as other cancers  4) Swallowing: Currently this is good. He understands there are risks of dysphagia from treatment. Referral will be made to swallowing therapy for prophylaxis  5) Dental: Referral will be made to dentistry for prophylactic treatment/extractions as needed  6) Social: Kathrin Penner of [facility] will visit with the patient today  7) Patient requesting opportunity to speak with a head and neck cancer survivor who has had similar treatment to him. I will facilitate this.  8) Chemotherapy plans to be made per Dr. Jethro Bolus.  9) It was a pleasure meeting the patient today. We discussed the risks, benefits, and side effects of post-operative radiotherapy (30 fractions to 60Gy total). We talked in detail about acute and late effects. He understands that some of the most bothersome acute effects will be significant soreness of the throat and mouth, changes in taste, changes in salivary function, and fatigue. We talked about late effects which include but are not necessarily limited to dental issues, jaw injury/trismus, dysphagia, hypothyroidism, dry mouth,  and neck edema. No guarantees of treatment were given. A consent form was signed and placed in the patient's medical record. The patient is enthusiastic about proceeding with treatment. I look forward to participating in the patient's care. Follow-up / treatment planning, pending clearance from dentistry. My goal is to start radiotherapy within 6 weeks of surgery. The patient was encouraged to call with any issues or questions before then.  10) The patient was offered enrollment on our single  institutional trial investigating open-faced vs close-face head/shoulder masks used to immobilize patients during head and neck radiotherapy. The patient has elected to enroll on this trial.  In regards to the trial, the patient has voluntarily signed copies of the consent forms  and all trial related questions were answered.  I spent 40 minutes minutes face to face with the patient and more than 50% of that time was spent in counseling and/or coordination of care. _____________________________________   Lonie Peak, MD

## 2012-03-27 NOTE — Addendum Note (Signed)
Encounter addended by: Eduardo Osier, RN on: 03/27/2012  4:47 PM<BR>     Documentation filed: Charges VN

## 2012-03-28 ENCOUNTER — Other Ambulatory Visit (HOSPITAL_COMMUNITY): Payer: Medicare HMO | Admitting: Dentistry

## 2012-03-28 ENCOUNTER — Ambulatory Visit (HOSPITAL_COMMUNITY): Payer: Medicare HMO | Admitting: Dentistry

## 2012-03-28 ENCOUNTER — Encounter (HOSPITAL_COMMUNITY): Payer: Medicare HMO | Admitting: Dentistry

## 2012-04-01 ENCOUNTER — Other Ambulatory Visit: Payer: Self-pay | Admitting: Radiation Oncology

## 2012-04-01 ENCOUNTER — Ambulatory Visit (HOSPITAL_COMMUNITY): Payer: Self-pay | Admitting: Dentistry

## 2012-04-01 ENCOUNTER — Encounter (HOSPITAL_COMMUNITY): Payer: Self-pay | Admitting: Dentistry

## 2012-04-01 VITALS — BP 155/78 | HR 53 | Temp 97.8°F | Ht 68.0 in | Wt 188.0 lb

## 2012-04-01 DIAGNOSIS — K0530 Chronic periodontitis, unspecified: Secondary | ICD-10-CM

## 2012-04-01 DIAGNOSIS — C060 Malignant neoplasm of cheek mucosa: Secondary | ICD-10-CM

## 2012-04-01 DIAGNOSIS — E119 Type 2 diabetes mellitus without complications: Secondary | ICD-10-CM

## 2012-04-01 DIAGNOSIS — M264 Malocclusion, unspecified: Secondary | ICD-10-CM

## 2012-04-01 DIAGNOSIS — Z0189 Encounter for other specified special examinations: Secondary | ICD-10-CM

## 2012-04-01 DIAGNOSIS — K036 Deposits [accretions] on teeth: Secondary | ICD-10-CM

## 2012-04-01 DIAGNOSIS — M270 Developmental disorders of jaws: Secondary | ICD-10-CM

## 2012-04-01 MED ORDER — SODIUM FLUORIDE 1.1 % DT GEL: DENTAL | Status: DC

## 2012-04-01 NOTE — Progress Notes (Signed)
DENTAL CONSULTATION  Date of Consultation:  04/01/2012 Patient Name:   [name] Date of Birth:   1950/07/29 Medical Record Number: [id number]  VITALS: BP 155/78  Pulse 53  Temp(Src) 97.8 F (36.6 C) (Oral)  Ht 5\' 8"  (1.727 m)  Wt 188 lb (85.276 kg)  BMI 28.59 kg/m2   CHIEF COMPLAINT: Patient referred for a medically necessary preradiation therapy dental evaluation.  HPI: Gregory Friedman is a 62 year old Hispanic male referred by Dr. Lonie Peak for a medically necessary preradiation therapy dental protocol evaluation. 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 Patient underwent surgical resection with Dr. Narda Bonds. Patient with anticipated postoperative radiation therapy with Dr. Basilio Cairo. Patient now seen as part of a preradiation therapy dental protocol evaluation.   Patient currently denies acute toothache, swellings, or abscesses. Patient was last seen by his primary dentist in January of 2014 for an exam and cleaning. Patient sees by [facility] (Dr. Arbutus Ped).  Patient is usually seen on a 3 times a year basis for his periodontal maintenance. Patient also had tooth #15 extracted at the time of the cancer resection by Dr. Ezzard Standing on 03/13/2012.    PMH: Past Medical History  Diagnosis Date  . Dyslipidemia   . DM (diabetes mellitus)     type II.   [name] GERD (gastroesophageal reflux disease)   . Hyperlipidemia   . Microscopic hematuria     referred to urology 2012  . Myocardial infarction 2003    with both PCI, and CABG  . BPH (benign prostatic hyperplasia)   . Arthritis   . Anxiety   . Depression   . Hypertension     sees Dr  Clarene Critchley. Nkapp  . Diverticulosis   . Buccal mucosa squamous cell carcinoma     PSH: Past Surgical History  Procedure Laterality Date  . Fracture surgery      RT TALAR REPAIR  . Cardiac catheterization  05/2010  . Cardiovascular stress test  07/2009  . Coronary angioplasty   2003; 2006    2 stents in heart  . Coronary artery bypass graft  01/2003    2 vessels  . Cystoscopy  02/09/2011    Procedure: CYSTOSCOPY;  Surgeon: Ky Barban, MD;  Location: [facility];  Service: Urology;  Laterality: N/A;  . Colonoscopy  01/2011    polyp; next due 2018.   . Radical neck dissection Left 03/13/2012    Procedure: RADICAL NECK DISSECTION;  Surgeon: Drema Halon, MD;  Location: [facility] OR;  Service: ENT;  Laterality: Left;  . Tooth extraction N/A 03/13/2012    Procedure: EXTRACTION MOLARS;  Surgeon: Drema Halon, MD;  Location: [facility] OR;  Service: ENT;  Laterality: N/A;  . Mass excision N/A 03/13/2012    Procedure: EXCISION LEFT BUCCAL MUCOSA;  Surgeon: Drema Halon, MD;  Location: [facility] OR;  Service: ENT;  Laterality: N/A;    ALLERGIES: No Known Allergies  MEDICATIONS: Current Outpatient Prescriptions  Medication Sig Dispense Refill  . ALOE PO Take 20 mg by mouth daily.       . celecoxib (CELEBREX) 200 MG capsule Take 200 mg by mouth 2 (two) times daily.      . enalapril (VASOTEC) 10 MG tablet Take 10 mg by mouth 2 (two) times daily.      [name] Hyaluronic Acid-Vitamin C (HYALURONIC ACID PO) Take 80 mg by mouth daily. Takes 1 tablet daily      . ibuprofen (ADVIL,MOTRIN) 200 MG tablet  Take 200 mg by mouth every 6 (six) hours as needed for pain. For pain      . metoprolol succinate (TOPROL-XL) 25 MG 24 hr tablet Take 25 mg by mouth at bedtime.      . nitroGLYCERIN (NITROSTAT) 0.4 MG SL tablet Place 1 tablet (0.4 mg total) under the tongue every 5 (five) minutes as needed. Chest pain  25 tablet  1  . omeprazole (PRILOSEC) 40 MG capsule       . OVER THE COUNTER MEDICATION Take 1 tablet by mouth daily.       . Saw Palmetto, Serenoa repens, 320 MG CAPS Take 1 capsule by mouth daily.      . Saxagliptin-Metformin 05-998 MG TB24 Take 1 tablet by mouth daily.      . simvastatin (ZOCOR) 40 MG tablet Take 1 tablet (40 mg total) by mouth at bedtime.  90 tablet  0  . Vitamins  C E (VITAMIN C & E COMPLEX) 500-400 MG-UNIT CAPS Take 1 capsule by mouth daily.       No current facility-administered medications for this visit.    LABS: Lab Results  Component Value Date   WBC 7.2 03/12/2012   HGB 13.6 03/12/2012   HCT 40.5 03/12/2012   MCV 82.1 03/12/2012   PLT 165 03/12/2012      Component Value Date/Time   NA 141 03/14/2012 1000   NA 142 03/12/2012 0955   K 3.6 03/14/2012 1000   K 4.0 03/12/2012 0955   CL 102 03/14/2012 1000   CL 105 03/12/2012 0955   CO2 29 03/14/2012 1000   CO2 28 03/12/2012 0955   GLUCOSE 176* 03/14/2012 1000   GLUCOSE 177* 03/12/2012 0955   BUN 10 03/14/2012 1000   BUN 16.3 03/12/2012 0955   CREATININE 1.23 03/14/2012 1000   CREATININE 1.5* 03/12/2012 0955   CREATININE 1.23 12/04/2011 0921   CALCIUM 8.8 03/14/2012 1000   CALCIUM 10.0 03/12/2012 0955   GFRNONAA 61* 03/14/2012 1000   GFRAA 71* 03/14/2012 1000   Lab Results  Component Value Date   INR 1.03 05/25/2010   No results found for this basename: PTT    SOCIAL HISTORY: History   Social History  . Marital Status: Married    Spouse Name: N/A    Number of Children: 3  . Years of Education: N/A   Occupational History  . 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 Smoking status: Never Smoker   . Smokeless tobacco: Never Used  . Alcohol Use: No  . Drug Use: No  . Sexually Active: Not on file   Other Topics Concern  . Not on file   Social History Narrative  . No narrative on file    FAMILY HISTORY: Family History  Problem Relation Age of Onset  . Stroke Sister   . Anesthesia problems Neg Hx   . Hypotension Neg Hx   . Malignant hyperthermia Neg Hx   . Pseudochol deficiency Neg Hx   . Colon cancer Neg Hx   . Heart disease Neg Hx   . Diabetes Brother   . Seizures Mother   . Asthma Father   . Cancer Daughter     thyroid cancer     REVIEW OF SYSTEMS: Reviewed with patient and included in dental consultation record.  DENTAL HISTORY: CHIEF  COMPLAINT: Patient referred for a medically necessary preradiation therapy dental evaluation.  HPI: Gregory Friedman is a 62 year old Hispanic male referred by  Dr. Lonie Peak for a medically necessary preradiation therapy dental protocol evaluation. 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 Patient underwent surgical resection with Dr. Narda Bonds. Patient with anticipated postoperative radiation therapy with Dr. Basilio Cairo. Patient now seen as part of a preradiation therapy dental protocol evaluation.   Patient currently denies acute toothache, swellings, or abscesses. Patient was last seen by his primary dentist in January of 2014 for an exam and cleaning. Patient sees by [facility] (Dr. Arbutus Ped).  Patient is usually seen on a 3 times a year basis for his periodontal maintenance. Patient also had tooth #15 extracted at the time of the cancer resection by Dr.  Narda Bonds on 03/13/2012.   DENTAL EXAMINATION:  GENERAL: Patient is a well-developed, well-nourished male in no acute distress. HEAD AND NECK: The left neck is consistent with previous neck dissection on 03/13/2012. There is no right neck lymphadenopathy palpated at this time. INTRAORAL EXAM: Normal saliva is noted. The left buccal mucosa is consistent with previous surgery and February, 2014.The extraction site #15 is healing in well. Patient has bilateral, multilobular mandibular lingual tori. The patient has multiple areas of lateral exostoses on the buccal aspect as charted.  DENTITION: Patient is missing tooth numbers 3, 15, 16, 17, 18, and 30. PERIODONTAL: Patient with chronic periodontitis with minimal plaque accumulations noted. Patient with incipient to moderate bone loss noted.Incipient tooth mobility of mandibular anterior teeth #23, 24, 25, 26 was measured as plus mobility. DENTAL CARIES/SUBOPTIMAL RESTORATIONS: There are no caries noted.The patient has a suboptimal  dental restoration associated with the resident on tooth #10. ENDODONTIC: Patient currently denies acute pulpitis symptoms. Patient has had a previous root canal therapy associated with tooth #6. CROWN AND BRIDGE: There are no crown restorations.Tooth #6 ideally could be evaluated for crown in the future.  PROSTHODONTIC: Patient denies presence of partial dentures. OCCLUSION: Patient with a poor occlusal scheme but a stable occlusion at this time.  RADIOGRAPHIC INTERPRETATION: An orthopantogram was obtained along with a full series of dental radiographs.    There are multiple missing teeth.  There is supra-eruption and drifting of the unopposed teeth into the edentulous areas. There is incipient bone loss noted. Tooth #6 has a previous root canal therapy with no obvious persistent periapical pathology.  Tooth #10 has a resin restoration with the mesial overhang.   ASSESSMENTS: 1. Chronic periodontitis with bone loss 2. Selective areas gingival recession 3. Plaque accumulations-minimal 4. Mandibular anterior incipient tooth mobility 5. Suboptimal dental restoration associated with tooth #10. 6. Missing teeth 7. Supra-eruption and drifting of the unopposed teeth into the edentulous areas 8. Poor occlusal scheme but a stable occlusion 9. Bilateral, multilobular mandibular lingual tori 10.  Multiple lateral exostoses on the buccal aspect as charted  PLAN/RECOMMENDATIONS: 1. I discussed the risks, benefits, and complications of various treatment options with the patient in relationship to his medical and dental conditions, anticipated radiation therapy, and radiation therapy side effects to include xerostomia, radiation caries, trismus, mucositis, taste just, gum and jawbone changes, and risk for infection, bleeding, and osteoradionecrosis. We discussed various treatment options to include no treatment, multiple extraction  of teeth in the primary field of radiation therapy with alveoloplasty,  pre-prosthetic surgery as indicated, periodontal therapy, dental restorations, root canal therapy, crown and bridge therapy, implant therapy, and replacement of missing teeth as indicated.  We also discussed fabrication of fluoride trays and scatter protection devices. The patient currently  does  NOT wish  to proceed with extraction of teeth in the primary field radiation therapy at this time and does understand the potential risk for osteoradionecrosis. Due to the lack of tooth mobility associated with these teeth, I do feel that the patient should be able to maintain these teeth as long as he maintains his periodontal maintenance therapy 3-4 times a year.  The patient does wish to proceed with impressions today for the fabrication of fluoride trays and scatter protection devices. These impressions were obtained today. A prescription for FluoroSHIELD was faxed to the [facility] pharmacy on [address]. Patient will return to dental medicine for insertion of fluoride trays and scatter protection devices as scheduled.   2. Discussion of findings with medical team and coordination of future medical and dental care as indicated.  Charlynne Pander, DDS

## 2012-04-01 NOTE — Patient Instructions (Signed)
RADIATION THERAPY AND DECISIONS REGARDING YOUR TEETH  Xerostomia (dry mouth) Your salivary glands may be in the filed of radiation.  Radiation may include all or part of your saliva glands.  This will cause your saliva to dry up and you will have a dry mouth.  The dry mouth will be for the rest of your life unless your radiation oncologist tells you otherwise.  Your saliva has many functions:  Saliva wets your tongue for speaking.  It coats your teeth and the inside of your mouth for easier movement.  It helps with chewing and swallowing food.  It helps clean away harmful acid and toxic products made by the germs in your mouth, therefore it helps prevent cavities.  It kills some germs in your mouth and helps to prevent gum disease.  It helps to carry flavor to your taste buds.  Once you have lost your saliva you will be at higher risk for tooth decay and gum disease.  What can be done to help improve your mouth when there's not enough saliva:  1.  Your dentist may give a prescription for Salagen.  It will not bring back all of your saliva but may bring back some of it.  Also your saliva may be thick and ropy or white and foamy. It will not feel like it use to feel.  2.  You will need to swish with water every time your mouth feels dry.  YOU CANNOT suck on any cough drops, mints, lemon drops, candy, vitamin C or any other products.  You cannot use anything other than water to make your mouth feel less dry.  If you want to drink anything else you have to drink it all at once and brush afterwards.  Be sure to discuss the details of your diet habits with your dentist or hygienist.  Radiation caries: This is decay that happens very quickly once your mouth is very dry due to radiation therapy.  Normally cavities take six months to two years to become a problem.  When you have dry mouth cavities may take as little as eight weeks to cause you a problem.  This is why dental check ups every two  months are necessary as long as you have a dry mouth. Radiation caries typically, but not always, start at your gum line where it is hard to see the cavity.  It is therefore also hard to fill these cavities adequately.  This high rate of cavities happens because your mouth no longer has saliva and therefore the acid made by the germs starts the decay process.  Whenever you eat anything the germs in your mouth change the food into acid.  The acid then burns a small hole in your tooth.  This small hole is the beginning of a cavity.  If this is not treated then it will grow bigger and become a cavity.  The way to avoid this hole getting bigger is to use fluoride every evening as prescribed by your dentist.  You have to make sure that your teeth are very clean before you use the fluoride.  This fluoride in turn will strengthen your teeth and prepare them for another day of fighting acid.  If you develop radiation caries many times the damage is so large that you will have to have all your teeth removed.  This could be a big problem if some of these teeth are in the field of radiation.  Further details of why this could be   a big problem will follow.  (See Osteoradionecrosis).  Loss of taste (dysgeusia) This happens to varying degrees once you've had radiation therapy to your jaw region.  Many times taste is not completely lost but becomes limited.  The loss of taste is mostly due to radiation affecting your taste buds.  However if you have no saliva in your mouth to carry the flavor to your taste buds it would be difficult for your taste buds to taste anything.  That is why using water or a prescription for Salagen prior to meals and during meals may help with some of the taste.  Keep in mind that taste generally returns very slowly over the course of several months or several years after radiation therapy.  Don't give up hope.  Trismus According to your Radiation Oncologist your TMJ or jaw joints are going to be  partially or fully in the field of radiation.  This means that over time the muscles that help you open and close your mouth may get stiff.  This will potentially result in your not being able to open your mouth wide enough or as wide as you can open it now.  Le me give you an example of how slowly this happens and how unaware people are of it.  A gentlemen that had radiation therapy two years ago came back to me complaining that bananas are just too large for him to be able to fit them in between his teeth.  He was not able to open wide enough to bite into a banana.  This happens slowly and over a period of time.  What do we do to try and prevent this?  Your dentist will probably give you a stack of sticks called a trismus exercise device .  This stack will help your remind your muscles and your jaw joint to open up to the same distance every day.  Use these sticks every morning when you wake up according to the instructions given by the dentist.   You must use these sticks for at least one to two years after radiation therapy.  The reason for that is because it happens so slowly and keeps going on for about two years after radiation therapy.  Your hospital dentist will help you monitor your mouth opening and make sure that it's not getting smaller.  Osteoradionecrosis (ORN) This is a condition where your jaw bone after having had radiation therapy becomes very dry.  It has very little blood supply to keep it alive.  If you develop a cavity that turns into an abscess or an infection then the jaw bone does not have enough blood supply to help fight the infection.  At this point it is very likely that the infection could cause the death of your jaw bone.  When you have dead bone it has to be removed.  Therefore you might end up having to have surgery to remove part of your jaw bone, the part of the jaw bone that has been affected.   Healing is also a problem if you are to have surgery in the areas where the bone  has had radiation therapy.  The same reasons apply.  If you have surgery you need more blood supply which is not available.  When blood supply and oxygen are not available again, there is a chance for the bone to die.  Occasionally ORN happens on its own with no obvious reason.  This is quite rare.  We believe that   patients who continue to smoke and/or drink alcohol have a higher chance of having this bone problem.  Therefore once your jaw bone has had radiation therapy if there are any teeth in that area, you should never have them pulled.  You should also never have any surgery on your teeth or gums in that area unless the oral surgeon or Periodontist is aware of your history of radiation. There is some expensive management techniques that might be used to limit your risks.  The risks for ORN either from infection or spontaneous ( or on it's own) are life long.    TRISMUS  Trismus is a condition where the jaw does not allow the mouth to open as wide as it usually does.  This can happen almost suddenly, or in other cases the process is so slow, it is hard to notice it-until it is too far along.  When the jaw joints and/or muscles have been exposed to radiation treatments, the onset of Trismus is very slow.  This is because the muscles are losing their stretching ability over a long period of time, as long as 2 YEARS after the end of radiation.  It is therefore important to exercise these muscles and joints.  TRISMUS EXERCISES   Stack of tongue depressors measuring the same or a little less than the last documented MIO (Maximum Interincisal Opening).  Secure them with a rubber band on both ends.  Place the stack in the patient's mouth, supporting the other end.  Allow 30 seconds for muscle stretching.  Rest for a few seconds.  Repeat 3-5 times  For all radiation patients, this exercise is recommended in the mornings and evenings unless otherwise instructed.  The exercise should be done for  a period of 2 YEARS after the end of radiation.  MIO should be checked routinely on recall dental visits by the general dentist or the hospital dentist.  The patient is advised to report any changes, soreness, or difficulties encountered when doing the exercises.  FLUORIDE TRAYS PATIENT INSTRUCTIONS    Obtain prescription from the pharmacy.  Don't be surprised if it needs to be ordered.   Be sure to let the pharmacy know when you are close to needing a new refill for them to have it ready for you without interruption of Fluoride use.   The best time to use your Fluoride is before bed time.   You must brush your teeth very well and floss before using the Fluoride in order to get the best use out of the Fluoride treatments.   Place 1 drop of Fluoride gel per tooth in the tray.   Place the tray on your lower teeth and/or your upper teeth.  Make sure the trays are seated all the way.  Remember, they only fit one way on your teeth.   Insert for 5 full minutes.   At the end of the 5 minutes, take the trays out.  SPIT OUT excess. .    Do NOT rinse your mouth!    Do NOT eat or drink after treatments for at least 30 minutes.  This is why the best time for your treatments is before bedtime.    Clean the inside of your Fluoride trays using COLD WATER and a toothbrush.    In order to keep your Trays from discoloring and free from odors, soak them overnight in denture cleaners such as Efferdent.  Do not use bleach or non denture products.    Store the trays   in a safe dry place AWAY from any heat until your next treatment.    Bring the trays with you for your next dental check-up.  The dentist will confirm their fit.    If anything happens to your Fluoride trays, or they don't fit as well after any dental work, please let us know as soon as possible. 

## 2012-04-02 ENCOUNTER — Telehealth: Payer: Self-pay | Admitting: *Deleted

## 2012-04-02 NOTE — Telephone Encounter (Signed)
CALLED PATIENT TO INFORM OF APPT. WITH DR. Donnamarie Poag PETERS ON 04-09-12 - ARRIVAL TIME - 2 PM, SPOKE WITH PATIENT 'S WIFE AND THEY ARE AWARE OF THIS APPT.

## 2012-04-03 ENCOUNTER — Telehealth: Payer: Self-pay | Admitting: Oncology

## 2012-04-03 ENCOUNTER — Ambulatory Visit (HOSPITAL_COMMUNITY): Payer: Self-pay | Admitting: Dentistry

## 2012-04-03 ENCOUNTER — Encounter (HOSPITAL_COMMUNITY): Payer: Self-pay | Admitting: Dentistry

## 2012-04-03 ENCOUNTER — Encounter: Payer: Self-pay | Admitting: Oncology

## 2012-04-03 ENCOUNTER — Ambulatory Visit (HOSPITAL_BASED_OUTPATIENT_CLINIC_OR_DEPARTMENT_OTHER): Payer: Medicare HMO | Admitting: Oncology

## 2012-04-03 VITALS — BP 169/86 | HR 59 | Temp 96.9°F | Resp 18 | Ht 68.0 in | Wt 187.5 lb

## 2012-04-03 VITALS — BP 165/81 | HR 98 | Temp 98.4°F

## 2012-04-03 DIAGNOSIS — Z0189 Encounter for other specified special examinations: Secondary | ICD-10-CM

## 2012-04-03 DIAGNOSIS — Z463 Encounter for fitting and adjustment of dental prosthetic device: Secondary | ICD-10-CM

## 2012-04-03 DIAGNOSIS — C060 Malignant neoplasm of cheek mucosa: Secondary | ICD-10-CM

## 2012-04-03 DIAGNOSIS — I10 Essential (primary) hypertension: Secondary | ICD-10-CM

## 2012-04-03 DIAGNOSIS — E119 Type 2 diabetes mellitus without complications: Secondary | ICD-10-CM

## 2012-04-03 MED ORDER — PROCHLORPERAZINE MALEATE 10 MG PO TABS: 10.0000 mg | ORAL_TABLET | Freq: Four times a day (QID) | ORAL | Status: DC | PRN

## 2012-04-03 MED ORDER — ONDANSETRON HCL 8 MG PO TABS: 8.0000 mg | ORAL_TABLET | Freq: Two times a day (BID) | ORAL | Status: DC | PRN

## 2012-04-03 MED ORDER — LORAZEPAM 0.5 MG PO TABS: 0.5000 mg | ORAL_TABLET | Freq: Four times a day (QID) | ORAL | Status: DC | PRN

## 2012-04-03 NOTE — Telephone Encounter (Signed)
[facility] pt appt for lab, MD, chemo class waiting for chemo and audiology appt

## 2012-04-03 NOTE — Progress Notes (Signed)
04/03/2012   Patient:            Gregory Friedman Date of Birth:  06-Apr-1950 MRN:                [id number]  BP 165/81  Pulse 98  Temp(Src) 98.4 F (36.9 C) (Oral)  Hassell Done now presents for insertion of upper and lower fluoride trays and scatter protection devices.  PROCEDURE: Appliances were tried in and adjusted as needed. [place]. Trismus device was fabricated 30 mm and using 18 sticks. Postop instructions were provided and a written and verbal format concerning the use and care of appliances. All questions were answered. Patient to return to clinic for periodic oral examination in approximately 2-3 weeks during radiation therapy. Patient to call if questions or problems arise before then.   Charlynne Pander, DDS

## 2012-04-03 NOTE — Patient Instructions (Addendum)
1.  Diagnosis:  Buccal mucosa cancer. 2.  Status:  Resection.  Pathology showed high risk features:  Positive nodes with extracapsular extension, lymphovascular invasion; muscle invasion. 3.  Recommendation:  Chance of recurrence is about 30-50%.   Radiation in combination with chemotherapy Cisplatin can potentially decrease the risk of recurrence and improve survival. 4.  Ciplatin is given once every 3 weeks x 3 doses for the duration of radiation.   Potential side effects of cisplatin include but not limited to nausea vomiting, hair loss, fatigue, mouthsore, hearing impairment, kidney dysfunction, abnormal electrolytes, low blood count, risk of bleeding and infection.   5.  In preparation for chemoradiation: - PEG tube. - Baseline hearing test. - Chemo class. - Fill nausea meds.  - See me again around 04/22/12 to answer last minute questions before starting chemo that day.  - Drink plenty of fluid (about 60-70 oz a day) the day before and the morning of chemo to ensure quick chemo time. - Mouth rinse formula:  1 teaspoon of salt + 1 teaspoon of baking soda in 1 liter of water.  Rinse your mouth with this solution about 3-4x/day to decrease chance of mouth sore.

## 2012-04-03 NOTE — Patient Instructions (Signed)
TRISMUS  Trismus is a condition where the jaw does not allow the mouth to open as wide as it usually does.  This can happen almost suddenly, or in other cases the process is so slow, it is hard to notice it-until it is too far along.  When the jaw joints and/or muscles have been exposed to radiation treatments, the onset of Trismus is very slow.  This is because the muscles are losing their stretching ability over a long period of time, as long as 2 YEARS after the end of radiation.  It is therefore important to exercise these muscles and joints.  TRISMUS EXERCISES   Stack of tongue depressors measuring the same or a little less than the last documented MIO (Maximum Interincisal Opening).  Secure them with a rubber band on both ends.  Place the stack in the patient's mouth, supporting the other end.  Allow 30 seconds for muscle stretching.  Rest for a few seconds.  Repeat 3-5 times  For all radiation patients, this exercise is recommended in the mornings and evenings unless otherwise instructed.  The exercise should be done for a period of 2 YEARS after the end of radiation.  MIO should be checked routinely on recall dental visits by the general dentist or the hospital dentist.  The patient is advised to report any changes, soreness, or difficulties encountered when doing the exercises.  FLUORIDE TRAYS PATIENT INSTRUCTIONS    Obtain prescription from the pharmacy.  Don't be surprised if it needs to be ordered.   Be sure to let the pharmacy know when you are close to needing a new refill for them to have it ready for you without interruption of Fluoride use.   The best time to use your Fluoride is before bed time.   You must brush your teeth very well and floss before using the Fluoride in order to get the best use out of the Fluoride treatments.   Place 1 drop of Fluoride gel per tooth in the tray.   Place the tray on your lower teeth and/or your upper teeth.  Make sure  the trays are seated all the way.  Remember, they only fit one way on your teeth.   Insert for 5 full minutes.   At the end of the 5 minutes, take the trays out.  SPIT OUT excess. .    Do NOT rinse your mouth!    Do NOT eat or drink after treatments for at least 30 minutes.  This is why the best time for your treatments is before bedtime.    Clean the inside of your Fluoride trays using COLD WATER and a toothbrush.    In order to keep your Trays from discoloring and free from odors, soak them overnight in denture cleaners such as Efferdent.  Do not use bleach or non denture products.    Store the trays in a safe dry place AWAY from any heat until your next treatment.    Bring the trays with you for your next dental check-up.  The dentist will confirm their fit.    If anything happens to your Fluoride trays, or they don't fit as well after any dental work, please let us know as soon as possible.

## 2012-04-03 NOTE — Progress Notes (Signed)
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   PAST THERAPY:  Wide excision of left buccal mucosa squamous cell carcinoma with left modified neck dissection.   CURRENT THERAPY: here to discuss adjuvant options.    INTERVAL HISTORY: Gregory Friedman 62 y.o. male returns for post op visit with his wife.  He underwent surgery without major problem.  He has expected left facial droop.  His incisional wound has healed completely without pain, erythema, purulent discharge.  His strength is normal.  He denied all symptoms.  He has no pain in the surgical site.  The rest of the 14-point review was negative.   Past Medical History  Diagnosis Date  . DM (diabetes mellitus)     type II.   [name] GERD (gastroesophageal reflux disease)   . Hyperlipidemia   . Microscopic hematuria     referred to urology 2012  . Myocardial infarction 2003    with both PCI, and CABG  . BPH (benign prostatic hyperplasia)   . Arthritis   . Anxiety   . Depression   . Hypertension     sees Dr  Clarene Critchley. Nkapp  . Diverticulosis   . Buccal mucosa squamous cell carcinoma     Past Surgical History  Procedure Laterality Date  . Fracture surgery      RT TALAR REPAIR  . Cardiac catheterization  05/2010  . Cardiovascular stress test  07/2009  . Coronary angioplasty  2003; 2006    2 stents in heart  . Coronary artery bypass graft  01/2003    2 vessels  . Cystoscopy  02/09/2011    Procedure: CYSTOSCOPY;  Surgeon: Ky Barban, MD;  Location: [facility];  Service: Urology;  Laterality: N/A;  . Colonoscopy  01/2011    polyp; next due 2018.   . Radical neck dissection Left 03/13/2012    Procedure: RADICAL NECK DISSECTION;  Surgeon: Drema Halon, MD;  Location: [facility] OR;  Service: ENT;  Laterality: Left;  . Tooth extraction N/A 03/13/2012    Procedure: EXTRACTION MOLARS;   Surgeon: Drema Halon, MD;  Location: [facility] OR;  Service: ENT;  Laterality: N/A;  . Mass excision N/A 03/13/2012    Procedure: EXCISION LEFT BUCCAL MUCOSA;  Surgeon: Drema Halon, MD;  Location: [facility] OR;  Service: ENT;  Laterality: N/A;    Current Outpatient Prescriptions  Medication Sig Dispense Refill  . ALOE PO Take 20 mg by mouth daily.       [name] aspirin 81 MG tablet Take 81 mg by mouth daily.      . enalapril (VASOTEC) 10 MG tablet Take 10 mg by mouth 2 (two) times daily.      [name] Hyaluronic Acid-Vitamin C (HYALURONIC ACID PO) Take 80 mg by mouth daily. Takes 1 tablet daily      . ibuprofen (ADVIL,MOTRIN) 200 MG tablet Take 200 mg by mouth every 6 (six) hours as needed for pain. For pain      . metoprolol succinate (TOPROL-XL) 25 MG 24 hr tablet Take 25 mg by mouth at bedtime.      . nitroGLYCERIN (NITROSTAT) 0.4 MG SL tablet Place 1 tablet (0.4 mg total) under the tongue every 5 (five) minutes as needed. Chest pain  25 tablet  1  . omeprazole (PRILOSEC) 40 MG capsule       . OVER THE  COUNTER MEDICATION Take 1 tablet by mouth daily.       . Saw Palmetto, Serenoa repens, 320 MG CAPS Take 1 capsule by mouth daily.      . Saxagliptin-Metformin 05-998 MG TB24 Take 1 tablet by mouth daily.      . simvastatin (ZOCOR) 40 MG tablet Take 1 tablet (40 mg total) by mouth at bedtime.  90 tablet  0  . sodium fluoride (FLUORISHIELD) 1.1 % GEL dental gel Brush and floss. Instill one drop of fluoride per tooth space of fluoride tray. Place over teeth for 5 minutes. Remove trays. Spit out excess. Do not rinse afterwards. Repeat nightly.  120 mL  11  . Vitamins C E (VITAMIN C & E COMPLEX) 500-400 MG-UNIT CAPS Take 1 capsule by mouth daily.      . celecoxib (CELEBREX) 200 MG capsule Take 200 mg by mouth 2 (two) times daily.      [name] LORazepam (ATIVAN) 0.5 MG tablet Take 1 tablet (0.5 mg total) by mouth every 6 (six) hours as needed (Nausea or vomiting).  30 tablet  1  . ondansetron (ZOFRAN) 8 MG tablet  Take 1 tablet (8 mg total) by mouth every 12 (twelve) hours as needed for nausea. Take two times a day as needed for nausea or vomiting starting on the third day after chemotherapy.  30 tablet  1  . prochlorperazine (COMPAZINE) 10 MG tablet Take 1 tablet (10 mg total) by mouth every 6 (six) hours as needed (Nausea or vomiting).  30 tablet  1   No current facility-administered medications for this visit.    ALLERGIES:  has No Known Allergies.  REVIEW OF SYSTEMS:  The rest of the 14-point review of system was negative.   Filed Vitals:   04/03/12 1502  BP: 169/86  Pulse: 59  Temp: 96.9 F (36.1 C)  Resp: 18   Wt Readings from Last 3 Encounters:  04/03/12 187 lb 8 oz (85.049 kg)  04/01/12 188 lb (85.276 kg)  03/13/12 182 lb 8 oz (82.781 kg)   ECOG Performance status: 0  PHYSICAL EXAMINATION:   General:  well-nourished man, in no acute distress.  Eyes:  no scleral icterus.  ENT:  There were no oropharyngeal lesions. There was no left buccal mucosal abnormality. Neck was without thyromegaly.  Left neck dissection has healed.  Lymphatics:  Negative cervical, supraclavicular or axillary adenopathy.  Respiratory: lungs were clear bilaterally without wheezing or crackles.  Cardiovascular:  Regular rate and rhythm, S1/S2, without murmur, rub or gallop.  There was no pedal edema.  GI:  abdomen was soft, flat, nontender, nondistended, without organomegaly.  Muscoloskeletal:  no spinal tenderness of palpation of vertebral spine.  Skin exam was without echymosis, petichae.  Neuro exam was nonfocal.  Patient was able to get on and off exam table without assistance.  Gait was normal.  Patient was alerted and oriented.  Attention was good.   Language was appropriate.  Mood was normal without depression.  Speech was not pressured.  Thought content was not tangential.         LABORATORY/RADIOLOGY DATA:  Lab Results  Component Value Date   WBC 7.2 03/12/2012   HGB 13.6 03/12/2012   HCT 40.5  03/12/2012   PLT 165 03/12/2012   GLUCOSE 176* 03/14/2012   CHOL 144 12/04/2011   TRIG 126 12/04/2011   HDL 35* 12/04/2011   LDLCALC 84 12/04/2011   ALKPHOS 77 03/12/2012   ALT 31 03/12/2012   AST 23 03/12/2012  NA 141 03/14/2012   K 3.6 03/14/2012   CL 102 03/14/2012   CREATININE 1.23 03/14/2012   BUN 10 03/14/2012   CO2 29 03/14/2012   PSA 0.58 08/22/2010   INR 1.03 05/25/2010   HGBA1C 7.1* 12/04/2011   MICROALBUR 0.92 12/04/2011     ASSESSMENT AND PLAN:   1.  Hypertension:  On enalapril, metoprolol.  2.  Hyperlipidemia:  On simvastatin. 3.  Diabetes mellitus, type II:  On saxagliptin/metformin. 4.  History of CAD:  On ASA,  enalapril, metoprolol, simvastatin.  5.  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  - Prognosis:  Chance of recurrence without further adjuvant therapy is about 30-50%.   Radiation in combination with chemotherapy Cisplatin can potentially decrease the risk of recurrence and improve survival.  -  Ciplatin is given once every 3 weeks x 3 doses for the duration of radiation.   - Potential side effects of cisplatin include but not limited to nausea vomiting, hair loss, fatigue, mouthsore, hearing impairment, kidney dysfunction, abnormal electrolytes, low blood count, risk of bleeding and infection.   - Mr. Sondra Come and his wife expressed informed understanding and wished to start chemo along with radiation.   5.  In preparation for chemoradiation: - PEG tube due next week. - Speech pathology also next week.  - Baseline hearing test. - Chemo class. - Fill nausea meds.  - See me again around 04/22/12 to answer last minute questions before starting chemo that day.  - Drink plenty of fluid (about 60-70 oz a day) the day before and the morning of chemo to ensure quick chemo time. - Mouth rinse formula:  1 teaspoon of salt + 1 teaspoon of baking soda in  1 liter of water.  Rinse your mouth with this solution about 3-4x/day to decrease chance of mouth sore.      The length of time of the face-to-face encounter was 25 minutes. More than 50% of time was spent counseling and coordination of care.

## 2012-04-04 ENCOUNTER — Ambulatory Visit: Payer: Medicare HMO | Admitting: Nutrition

## 2012-04-04 ENCOUNTER — Other Ambulatory Visit: Payer: Self-pay | Admitting: Radiology

## 2012-04-04 ENCOUNTER — Encounter: Payer: Self-pay | Admitting: Oncology

## 2012-04-04 NOTE — Progress Notes (Signed)
This is a 62 year old male patient of Dr. Gaylyn Rong and Dr. Basilio Cairo diagnosed with cancer of the left buccal mucosa. He will be receiving concurrent chemoradiation therapy.  Past medical history includes dyslipidemia, diabetes, GERD, hyperlipidemia, MI, arthritis, anxiety, depression, hypertension, and diverticulosis.  Medications include Celebrex, vitamin C, Prilosec, saw palmetto, metformin, Zocor, and vitamin C/E.  Labs include glucose 146 on February 21.  Height: 68 inches. Weight: 187.5 pounds March 12. Usual body weight 200 pounds January 2013. BMI: 28.52.  Estimated nutrition needs: 2130-2450 calories, 115-130 g protein, 2.3 L fluid.  Patient is scheduled for a PEG tube placement. He will begin his treatment on March 31 receiving concurrent chemoradiation therapy. His surgical wound has healed.  Nutrition diagnosis: Predicted suboptimal energy intake related to new diagnosis of cancer of the left buccal mucosa as evidenced by history or presence of this condition for which research shows an increased incidence of suboptimal energy intake.  Intervention: I educated patient and wife on strategies for increased calories and protein split up into 3 meals and 3 snacks daily. I have reviewed high-protein foods with patient and suggested he may want to begin an oral nutrition supplements such as Glucerna when he notices it is difficult to eat. I've reviewed the importance of hydration and drinking plenty of fluids. I've also educated him on strategies for eating with mouth sores, nausea, vomiting, and constipation. I've enforced the importance of mouth rinses and swallowing exercises. I provided fact sheets and my contact information along with samples of oral nutrition supplements. I've answered his questions. Teach back method used.  Monitoring, evaluation, goals: Patient will tolerate increased oral intake to minimize weight loss. Tube feedings will be initiated once patient has lost 5% of his usual  body weight.  Next visit: Monday, March 31, during chemotherapy.

## 2012-04-04 NOTE — Progress Notes (Signed)
Enrolled patient online for chemo authorization.  Ref [id number].  Faxed clinical to [facility] 19 pages to [phone].  Phone number [phone].

## 2012-04-05 ENCOUNTER — Other Ambulatory Visit: Payer: Self-pay | Admitting: Oncology

## 2012-04-05 ENCOUNTER — Telehealth: Payer: Self-pay | Admitting: Oncology

## 2012-04-05 DIAGNOSIS — C060 Malignant neoplasm of cheek mucosa: Secondary | ICD-10-CM

## 2012-04-05 NOTE — Telephone Encounter (Signed)
Talked to [facility] @ Audiology clinic they will call pt and schedule appt, also emailed Dr. Gaylyn Rong , Karolee Ohs requesting order to attach to appt, he will call pt for apptdate and time

## 2012-04-08 ENCOUNTER — Ambulatory Visit: Payer: Medicare HMO

## 2012-04-08 ENCOUNTER — Ambulatory Visit: Payer: Self-pay

## 2012-04-08 ENCOUNTER — Encounter (HOSPITAL_COMMUNITY): Payer: Self-pay | Admitting: Pharmacy Technician

## 2012-04-09 ENCOUNTER — Telehealth: Payer: Self-pay | Admitting: Radiation Oncology

## 2012-04-09 ENCOUNTER — Ambulatory Visit
Admission: RE | Admit: 2012-04-09 | Discharge: 2012-04-09 | Disposition: A | Discharge status: Home / Self Care | Payer: Medicare HMO | Source: Ambulatory Visit | Attending: Radiation Oncology | Admitting: Radiation Oncology

## 2012-04-09 ENCOUNTER — Ambulatory Visit: Payer: Medicare HMO | Admitting: Psychiatry

## 2012-04-09 DIAGNOSIS — C060 Malignant neoplasm of cheek mucosa: Secondary | ICD-10-CM | POA: Insufficient documentation

## 2012-04-09 DIAGNOSIS — Z931 Gastrostomy status: Secondary | ICD-10-CM | POA: Insufficient documentation

## 2012-04-09 DIAGNOSIS — M272 Inflammatory conditions of jaws: Secondary | ICD-10-CM | POA: Insufficient documentation

## 2012-04-09 DIAGNOSIS — R259 Unspecified abnormal involuntary movements: Secondary | ICD-10-CM | POA: Insufficient documentation

## 2012-04-09 DIAGNOSIS — Z510 Encounter for antineoplastic radiation therapy: Secondary | ICD-10-CM | POA: Insufficient documentation

## 2012-04-09 DIAGNOSIS — R066 Hiccough: Secondary | ICD-10-CM | POA: Insufficient documentation

## 2012-04-09 DIAGNOSIS — K1370 Unspecified lesions of oral mucosa: Secondary | ICD-10-CM | POA: Insufficient documentation

## 2012-04-09 NOTE — Progress Notes (Signed)
Patient brought to dressing room 11 to d/c IV heplock, d/c Iv heplock, placed 2x2 gause overLeft forearm, and taped,held presure 1 minute, gave instructions to leave for 2-3 hours ,when removing dressing if bleeding still occurs reapply pressure and leave on overnight, then place bandaid over that site,patient gave teach back  11:22 AM

## 2012-04-09 NOTE — Telephone Encounter (Signed)
Met w patient to discuss RO billing. Pt had some financial concerns and advised, he will call at a later time if needs help.  Dx: Buccal mucosa squamous cell carcinoma - Primary 145.0   Attending Rad: SS   Rad Tx:  IMRT x 40

## 2012-04-09 NOTE — Progress Notes (Signed)
  Radiation Oncology         [phone]) [phone] ________________________________  Name: Gregory Friedman MRN: [id number]  Date: 04/09/2012  DOB: 1950/07/19  SIMULATION AND TREATMENT PLANNING NOTE, Spec Tx Procedure outpatient  DIAGNOSIS: pT1N2b Left Buccal Mucosa SCC  NARRATIVE:  The patient was brought to the CT Simulation planning suite.  Identity was confirmed.  All relevant records and images related to the planned course of therapy were reviewed.  The patient freely provided informed written consent to proceed with treatment after reviewing the details related to the planned course of therapy. The consent form was witnessed and verified by the simulation staff.    Then, the patient was set-up in a stable reproducible  supine position for radiation therapy WITH AQUAPLAST MASK.  IV CONTRAST GIVEN. CT images were obtained.  Surface markings were placed.  The CT images were loaded into the planning software.    TREATMENT PLANNING NOTE: Treatment planning then occurred.  The radiation prescription was entered and confirmed.      I have requested : Intensity Modulated Radiotherapy (IMRT) is medically necessary for this case for the following reason:  Parotid sparing, spinal cord, esophageal, mandibular sparing.  I have ordered:Nutrition Consult  Special Treatment Procedure Note: The patient will be receiving chemotherapy concurrently. Chemotherapy heightens the risk of side effects. I have considered this during the patient's treatment planning process and will monitor the patient accordingly for side effects on a weekly basis. Concurrent chemotherapy increases the complexity of this patient's treatment and therefore this constitutes a special treatment procedure.  The patient will receive a total of 60 Gy in 30 fractions to his left buccal region and bilateral neck by dose painting tehcnique.   -----------------------------------  Lonie Peak, MD

## 2012-04-10 ENCOUNTER — Ambulatory Visit (HOSPITAL_COMMUNITY)
Admission: RE | Admit: 2012-04-10 | Discharge: 2012-04-10 | Disposition: A | Discharge status: Home / Self Care | Payer: Medicare HMO | Source: Ambulatory Visit | Attending: Radiation Oncology | Admitting: Radiation Oncology

## 2012-04-10 ENCOUNTER — Telehealth: Payer: Self-pay | Admitting: *Deleted

## 2012-04-10 ENCOUNTER — Encounter (HOSPITAL_COMMUNITY): Payer: Self-pay

## 2012-04-10 ENCOUNTER — Encounter: Payer: Self-pay | Admitting: Nutrition

## 2012-04-10 ENCOUNTER — Other Ambulatory Visit: Payer: Self-pay | Admitting: Radiation Oncology

## 2012-04-10 DIAGNOSIS — C060 Malignant neoplasm of cheek mucosa: Secondary | ICD-10-CM

## 2012-04-10 LAB — CBC
HCT: 37.9 % — ABNORMAL LOW (ref 39.0–52.0)
Hemoglobin: 12.5 g/dL — ABNORMAL LOW (ref 13.0–17.0)
MCHC: 33 g/dL (ref 30.0–36.0)

## 2012-04-10 LAB — BASIC METABOLIC PANEL
BUN: 14 mg/dL (ref 6–23)
Chloride: 99 mEq/L (ref 96–112)
GFR calc Af Amer: 80 mL/min — ABNORMAL LOW (ref >90)
Potassium: 3.6 mEq/L (ref 3.5–5.1)

## 2012-04-10 LAB — PROTIME-INR: INR: 1 (ref 0.00–1.49)

## 2012-04-10 MED ORDER — FENTANYL CITRATE 0.05 MG/ML IJ SOLN
INTRAMUSCULAR | Status: AC | PRN
  Administered 2012-04-10: 100 ug via INTRAVENOUS

## 2012-04-10 MED ORDER — MIDAZOLAM HCL 2 MG/2ML IJ SOLN
INTRAMUSCULAR | Status: AC | PRN
  Administered 2012-04-10 (×2): 1 mg via INTRAVENOUS

## 2012-04-10 MED ORDER — ONDANSETRON HCL 4 MG/2ML IJ SOLN
INTRAMUSCULAR | Status: AC
  Filled 2012-04-10: qty 2

## 2012-04-10 MED ORDER — LIDOCAINE HCL 1 % IJ SOLN
INTRAMUSCULAR | Status: AC
  Filled 2012-04-10: qty 20

## 2012-04-10 MED ORDER — MIDAZOLAM HCL 2 MG/2ML IJ SOLN
INTRAMUSCULAR | Status: AC
  Filled 2012-04-10: qty 6

## 2012-04-10 MED ORDER — CEFAZOLIN SODIUM-DEXTROSE 2-3 GM-% IV SOLR
2.0000 g | INTRAVENOUS | Status: AC
  Administered 2012-04-10: 2 g via INTRAVENOUS
  Filled 2012-04-10: qty 50

## 2012-04-10 MED ORDER — FENTANYL CITRATE 0.05 MG/ML IJ SOLN
INTRAMUSCULAR | Status: AC
  Filled 2012-04-10: qty 6

## 2012-04-10 MED ORDER — ONDANSETRON HCL 4 MG/2ML IJ SOLN
INTRAMUSCULAR | Status: AC | PRN
  Administered 2012-04-10: 4 mg via INTRAVENOUS

## 2012-04-10 MED ORDER — IOHEXOL 300 MG/ML  SOLN
10.0000 mL | Freq: Once | INTRAMUSCULAR | Status: AC | PRN
  Administered 2012-04-10: 50 mL

## 2012-04-10 MED ORDER — SODIUM CHLORIDE 0.9 % IV SOLN
INTRAVENOUS | Status: DC
  Administered 2012-04-10: 11:00:00 via INTRAVENOUS

## 2012-04-10 NOTE — Procedures (Signed)
20 Fr pull through gastrostomy No comp 

## 2012-04-10 NOTE — Telephone Encounter (Signed)
CALLED PATIENT TO ASK QUESTION, LVM FOR A RETURN CALL 

## 2012-04-10 NOTE — H&P (Signed)
Gregory Friedman is an 63 y.o. male.   Chief Complaint: "I'm here to get a tube in my stomach" HPI: Patient with history of left buccal mucosa squamous cell carcinoma presents today for placement of a percutaneous gastrostomy tube prior to initiation of chemoradiation.  Past Medical History  Diagnosis Date  . DM (diabetes mellitus)     type II.   [name] GERD (gastroesophageal reflux disease)   . Hyperlipidemia   . Microscopic hematuria     referred to urology 2012  . Myocardial infarction 2003    with both PCI, and CABG  . BPH (benign prostatic hyperplasia)   . Arthritis   . Anxiety   . Depression   . Hypertension     sees Dr  Clarene Critchley. Nkapp  . Diverticulosis   . Buccal mucosa squamous cell carcinoma     Past Surgical History  Procedure Laterality Date  . Fracture surgery      RT TALAR REPAIR  . Cardiac catheterization  05/2010  . Cardiovascular stress test  07/2009  . Coronary angioplasty  2003; 2006    2 stents in heart  . Coronary artery bypass graft  01/2003    2 vessels  . Cystoscopy  02/09/2011    Procedure: CYSTOSCOPY;  Surgeon: Ky Barban, MD;  Location: [facility];  Service: Urology;  Laterality: N/A;  . Colonoscopy  01/2011    polyp; next due 2018.   . Radical neck dissection Left 03/13/2012    Procedure: RADICAL NECK DISSECTION;  Surgeon: Drema Halon, MD;  Location: [facility] OR;  Service: ENT;  Laterality: Left;  . Tooth extraction N/A 03/13/2012    Procedure: EXTRACTION MOLARS;  Surgeon: Drema Halon, MD;  Location: [facility] OR;  Service: ENT;  Laterality: N/A;  . Mass excision N/A 03/13/2012    Procedure: EXCISION LEFT BUCCAL MUCOSA;  Surgeon: Drema Halon, MD;  Location: [facility] OR;  Service: ENT;  Laterality: N/A;    Family History  Problem Relation Age of Onset  . Stroke Sister   . Anesthesia problems Neg Hx   . Hypotension Neg Hx   . Malignant hyperthermia Neg Hx   . Pseudochol deficiency Neg Hx   . Colon cancer Neg Hx   . Heart disease Neg Hx   .  Diabetes Brother   . Seizures Mother   . Asthma Father   . Cancer Daughter     thyroid cancer   Social History:  reports that he has never smoked. He has never used smokeless tobacco. He reports that he does not drink alcohol or use illicit drugs.  Allergies: No Known Allergies  Current outpatient prescriptions:aspirin 81 MG tablet, Take 81 mg by mouth daily., Disp: , Rfl: ;  enalapril (VASOTEC) 10 MG tablet, Take 10 mg by mouth 2 (two) times daily., Disp: , Rfl: ;  Hyaluronic Acid-Vitamin C (HYALURONIC ACID PO), Take 80 mg by mouth daily. Takes 1 tablet daily, Disp: , Rfl: ;  metoprolol succinate (TOPROL-XL) 25 MG 24 hr tablet, Take 25 mg by mouth at bedtime., Disp: , Rfl:  omeprazole (PRILOSEC) 40 MG capsule, Take 40 mg by mouth daily. , Disp: , Rfl: ;  OVER THE COUNTER MEDICATION, Take 2 tablets by mouth daily. , Disp: , Rfl: ;  OVER THE COUNTER MEDICATION, Take 20 mg by mouth daily. ALOE, Disp: , Rfl: ;  Saw Palmetto, Serenoa repens, 320 MG CAPS, Take 1 capsule by mouth daily., Disp: , Rfl: ;  Saxagliptin-Metformin 05-998 MG TB24, Take 1 tablet  by mouth daily., Disp: , Rfl:  simvastatin (ZOCOR) 40 MG tablet, Take 1 tablet (40 mg total) by mouth at bedtime., Disp: 90 tablet, Rfl: 0;  Vitamins C E (VITAMIN C & E COMPLEX) 500-400 MG-UNIT CAPS, Take 1 capsule by mouth daily., Disp: , Rfl: ;  nitroGLYCERIN (NITROSTAT) 0.4 MG SL tablet, Place 1 tablet (0.4 mg total) under the tongue every 5 (five) minutes as needed. Chest pain, Disp: 25 tablet, Rfl: 1 sodium fluoride (FLUORISHIELD) 1.1 % GEL dental gel, Brush and floss. Instill one drop of fluoride per tooth space of fluoride tray. Place over teeth for 5 minutes. Remove trays. Spit out excess. Do not rinse afterwards. Repeat nightly., Disp: 120 mL, Rfl: 11 Current facility-administered medications:0.9 %  sodium chloride infusion, , Intravenous, Continuous, D Kevin Gerene Nedd, PA-C;  ceFAZolin (ANCEF) IVPB 2 g/50 mL premix, 2 g, Intravenous, On Call, D Kevin  Jennavieve Arrick, PA-C;  iohexol (OMNIPAQUE) 300 MG/ML solution 10 mL, 10 mL, Other, Once PRN, Medication Radiologist, MD   Results for orders placed during the hospital encounter of 04/10/12 (from the past 48 hour(s))  APTT     Status: None   Collection Time    04/10/12  7:40 AM      Result Value Range   aPTT 29  24 - 37 seconds  BASIC METABOLIC PANEL     Status: Abnormal   Collection Time    04/10/12  7:40 AM      Result Value Range   Sodium 137  135 - 145 mEq/L   Potassium 3.6  3.5 - 5.1 mEq/L   Chloride 99  96 - 112 mEq/L   CO2 25  19 - 32 mEq/L   Glucose, Bld 130 (*) 70 - 99 mg/dL   BUN 14  6 - 23 mg/dL   Creatinine, Ser 1.61  0.50 - 1.35 mg/dL   Calcium 9.5  8.4 - 09.6 mg/dL   GFR calc non Af Amer 69 (*) >90 mL/min   GFR calc Af Amer 80 (*) >90 mL/min   Comment:            The eGFR has been calculated     using the CKD EPI equation.     This calculation has not been     validated in all clinical     situations.     eGFR's persistently     <90 mL/min signify     possible Chronic Kidney Disease.  CBC     Status: Abnormal   Collection Time    04/10/12  7:40 AM      Result Value Range   WBC 5.2  4.0 - 10.5 K/uL   RBC 4.60  4.22 - 5.81 MIL/uL   Hemoglobin 12.5 (*) 13.0 - 17.0 g/dL   HCT 04.5 (*) 40.9 - 81.1 %   MCV 82.4  78.0 - 100.0 fL   MCH 27.2  26.0 - 34.0 pg   MCHC 33.0  30.0 - 36.0 g/dL   RDW 91.4  78.2 - 95.6 %   Platelets 157  150 - 400 K/uL  PROTIME-INR     Status: None   Collection Time    04/10/12  7:40 AM      Result Value Range   Prothrombin Time 13.1  11.6 - 15.2 seconds   INR 1.00  0.00 - 1.49   No results found.  Review of Systems  Constitutional: Negative for fever and chills.  HENT:       Occ HA's  Respiratory:  Negative for cough and shortness of breath.   Cardiovascular: Negative for chest pain.  Gastrointestinal: Negative for nausea, vomiting and abdominal pain.  Musculoskeletal: Negative for back pain.  Endo/Heme/Allergies: Does not  bruise/bleed easily.    Vitals: BP 135/81  HR 60  R 20  TEMP 97.7  O2 SATS 97%RA                                                                                                         Physical Exam  Constitutional: He is oriented to person, place, and time. He appears well-developed and well-nourished.  Cardiovascular: Normal rate and regular rhythm.   Respiratory: Effort normal and breath sounds normal.  GI: Soft. Bowel sounds are normal. There is no tenderness.  Musculoskeletal: Normal range of motion. He exhibits no edema.  Neurological: He is alert and oriented to person, place, and time.  Left facial droop s/p resection of left buccal mucosa SCC with LN dissection     Assessment/Plan Pt with left buccal mucosa squamous cell carcinoma. Plan is for placement of a percutaneous gastrostomy tube today prior to chemoradiation. Details/risks of procedure d/w pt/wife with their understanding and consent.  Takeru Bose,D KEVIN 04/10/2012, 8:45 AM

## 2012-04-11 ENCOUNTER — Other Ambulatory Visit: Payer: Medicare HMO

## 2012-04-11 ENCOUNTER — Ambulatory Visit: Payer: Medicare HMO

## 2012-04-11 ENCOUNTER — Ambulatory Visit
Admission: RE | Admit: 2012-04-11 | Discharge: 2012-04-11 | Disposition: A | Discharge status: Home / Self Care | Payer: Medicare HMO | Source: Ambulatory Visit | Attending: Radiation Oncology | Admitting: Radiation Oncology

## 2012-04-11 DIAGNOSIS — C060 Malignant neoplasm of cheek mucosa: Secondary | ICD-10-CM

## 2012-04-11 LAB — BUN AND CREATININE (CC13): BUN: 13.5 mg/dL (ref 7.0–26.0)

## 2012-04-15 ENCOUNTER — Ambulatory Visit: Payer: Medicare HMO | Attending: Radiation Oncology

## 2012-04-15 ENCOUNTER — Ambulatory Visit: Payer: Medicare HMO

## 2012-04-15 DIAGNOSIS — Z931 Gastrostomy status: Secondary | ICD-10-CM | POA: Insufficient documentation

## 2012-04-15 DIAGNOSIS — C770 Secondary and unspecified malignant neoplasm of lymph nodes of head, face and neck: Secondary | ICD-10-CM | POA: Insufficient documentation

## 2012-04-15 DIAGNOSIS — C060 Malignant neoplasm of cheek mucosa: Secondary | ICD-10-CM | POA: Insufficient documentation

## 2012-04-15 DIAGNOSIS — IMO0001 Reserved for inherently not codable concepts without codable children: Secondary | ICD-10-CM | POA: Insufficient documentation

## 2012-04-18 ENCOUNTER — Ambulatory Visit: Payer: Medicare HMO | Admitting: Audiology

## 2012-04-18 ENCOUNTER — Ambulatory Visit: Admission: RE | Admit: 2012-04-18 | Payer: Medicare HMO | Source: Ambulatory Visit | Admitting: Radiation Oncology

## 2012-04-22 ENCOUNTER — Ambulatory Visit: Payer: Medicare HMO | Admitting: Nutrition

## 2012-04-22 ENCOUNTER — Other Ambulatory Visit (HOSPITAL_BASED_OUTPATIENT_CLINIC_OR_DEPARTMENT_OTHER): Payer: Medicare HMO | Admitting: Lab

## 2012-04-22 ENCOUNTER — Encounter: Payer: Self-pay | Admitting: Radiation Oncology

## 2012-04-22 ENCOUNTER — Ambulatory Visit
Admission: RE | Admit: 2012-04-22 | Discharge: 2012-04-22 | Disposition: A | Discharge status: Home / Self Care | Payer: Medicare HMO | Source: Ambulatory Visit | Attending: Radiation Oncology | Admitting: Radiation Oncology

## 2012-04-22 ENCOUNTER — Ambulatory Visit (HOSPITAL_BASED_OUTPATIENT_CLINIC_OR_DEPARTMENT_OTHER): Payer: Medicare HMO | Admitting: Oncology

## 2012-04-22 ENCOUNTER — Telehealth: Payer: Self-pay | Admitting: Oncology

## 2012-04-22 ENCOUNTER — Ambulatory Visit (HOSPITAL_BASED_OUTPATIENT_CLINIC_OR_DEPARTMENT_OTHER): Payer: Medicare HMO

## 2012-04-22 VITALS — BP 156/83 | HR 53 | Temp 96.9°F | Resp 18 | Ht 68.0 in | Wt 186.0 lb

## 2012-04-22 DIAGNOSIS — C060 Malignant neoplasm of cheek mucosa: Secondary | ICD-10-CM

## 2012-04-22 DIAGNOSIS — E119 Type 2 diabetes mellitus without complications: Secondary | ICD-10-CM

## 2012-04-22 DIAGNOSIS — I10 Essential (primary) hypertension: Secondary | ICD-10-CM

## 2012-04-22 DIAGNOSIS — Z5111 Encounter for antineoplastic chemotherapy: Secondary | ICD-10-CM

## 2012-04-22 LAB — CBC WITH DIFFERENTIAL/PLATELET
BASO%: 1.4 % (ref 0.0–2.0)
EOS%: 8.8 % — ABNORMAL HIGH (ref 0.0–7.0)
Eosinophils Absolute: 0.6 10*3/uL — ABNORMAL HIGH (ref 0.0–0.5)
MCV: 82.5 fL (ref 79.3–98.0)
MONO%: 8.3 % (ref 0.0–14.0)
NEUT#: 3.2 10*3/uL (ref 1.5–6.5)
RBC: 4.97 10*6/uL (ref 4.20–5.82)
RDW: 12.8 % (ref 11.0–14.6)
nRBC: 0 % (ref 0–0)

## 2012-04-22 LAB — COMPREHENSIVE METABOLIC PANEL (CC13)
ALT: 25 U/L (ref 0–55)
AST: 25 U/L (ref 5–34)
Alkaline Phosphatase: 96 U/L (ref 40–150)
CO2: 26 mEq/L (ref 22–29)
Sodium: 140 mEq/L (ref 136–145)
Total Bilirubin: 0.34 mg/dL (ref 0.20–1.20)
Total Protein: 9.2 g/dL — ABNORMAL HIGH (ref 6.4–8.3)

## 2012-04-22 MED ORDER — SODIUM CHLORIDE 0.9 % IV SOLN
150.0000 mg | Freq: Once | INTRAVENOUS | Status: AC
  Administered 2012-04-22: 150 mg via INTRAVENOUS
  Filled 2012-04-22: qty 5

## 2012-04-22 MED ORDER — PALONOSETRON HCL INJECTION 0.25 MG/5ML
0.2500 mg | Freq: Once | INTRAVENOUS | Status: AC
  Administered 2012-04-22: 0.25 mg via INTRAVENOUS

## 2012-04-22 MED ORDER — POTASSIUM CHLORIDE 2 MEQ/ML IV SOLN
Freq: Once | INTRAVENOUS | Status: AC
  Administered 2012-04-22: 12:00:00 via INTRAVENOUS
  Filled 2012-04-22: qty 10

## 2012-04-22 MED ORDER — SODIUM CHLORIDE 0.9 % IV SOLN
99.0000 mg/m2 | Freq: Once | INTRAVENOUS | Status: AC
  Administered 2012-04-22: 200 mg via INTRAVENOUS
  Filled 2012-04-22: qty 200

## 2012-04-22 MED ORDER — DEXAMETHASONE SODIUM PHOSPHATE 4 MG/ML IJ SOLN
12.0000 mg | Freq: Once | INTRAMUSCULAR | Status: AC
  Administered 2012-04-22: 12 mg via INTRAVENOUS

## 2012-04-22 NOTE — Progress Notes (Signed)
IMRT Device Note   9.8 delivered field widths represent one set of IMRT treatment devices. The code is 77338.  -----------------------------------  Noble Cicalese, MD  

## 2012-04-22 NOTE — Telephone Encounter (Signed)
gv and printed appt schedule for pt for April and May °

## 2012-04-22 NOTE — Progress Notes (Signed)
   Weekly Management Note: Outpatient Current Dose: 2 Gy  Projected Dose: 60 Gy   Narrative:  The patient presents for routine under treatment assessment.  CBCT/MVCT images/Port film x-rays were reviewed.  The chart was checked. Doing well. No complaints.  Physical Findings: No acute effects thus far. Well appearing.   CBC    Component Value Date/Time   WBC 5.2 04/10/2012 0740   WBC 7.2 03/12/2012 0955   RBC 4.60 04/10/2012 0740   RBC 4.93 03/12/2012 0955   HGB 12.5* 04/10/2012 0740   HGB 13.6 03/12/2012 0955   HCT 37.9* 04/10/2012 0740   HCT 40.5 03/12/2012 0955   PLT 157 04/10/2012 0740   PLT 165 03/12/2012 0955   MCV 82.4 04/10/2012 0740   MCV 82.1 03/12/2012 0955   MCH 27.2 04/10/2012 0740   MCH 27.6 03/12/2012 0955   MCHC 33.0 04/10/2012 0740   MCHC 33.6 03/12/2012 0955   RDW 12.8 04/10/2012 0740   RDW 13.5 03/12/2012 0955   LYMPHSABS 1.7 03/12/2012 0955   MONOABS 0.6 03/12/2012 0955   EOSABS 0.5 03/12/2012 0955   BASOSABS 0.1 03/12/2012 0955   CMP     Component Value Date/Time   NA 137 04/10/2012 0740   NA 142 03/12/2012 0955   K 3.6 04/10/2012 0740   K 4.0 03/12/2012 0955   CL 99 04/10/2012 0740   CL 105 03/12/2012 0955   CO2 25 04/10/2012 0740   CO2 28 03/12/2012 0955   GLUCOSE 130* 04/10/2012 0740   GLUCOSE 177* 03/12/2012 0955   BUN 13.5 04/11/2012 1230   BUN 14 04/10/2012 0740   CREATININE 1.3 04/11/2012 1230   CREATININE 1.11 04/10/2012 0740   CREATININE 1.23 12/04/2011 0921   CALCIUM 9.5 04/10/2012 0740   CALCIUM 10.0 03/12/2012 0955   PROT 7.9 03/12/2012 0955   PROT 8.1 03/04/2012 1455   ALBUMIN 4.0 03/12/2012 0955   ALBUMIN 4.9 03/04/2012 1455   AST 23 03/12/2012 0955   AST 24 03/04/2012 1455   ALT 31 03/12/2012 0955   ALT 28 03/04/2012 1455   ALKPHOS 77 03/12/2012 0955   ALKPHOS 59 03/04/2012 1455   BILITOT 0.51 03/12/2012 0955   BILITOT 0.5 03/04/2012 1455   GFRNONAA 69* 04/10/2012 0740   GFRAA 80* 04/10/2012 0740    Impression:  The patient is tolerating radiotherapy.  Plan:   Continue radiotherapy as planned. Proceed to medical oncology to start chemotherapy  In regards to the mask trial, the patient has signed copies of the consent forms and all trial related questions were answered.  ________________________________   Lonie Peak, M.D.

## 2012-04-22 NOTE — Progress Notes (Signed)
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   PAST THERAPY:  Wide excision of left buccal mucosa squamous cell carcinoma with left modified neck dissection.   CURRENT THERAPY: due to start adjuvant chemo q3week Cisplatin and daily radiation today 04/22/12.    INTERVAL HISTORY: Gregory Friedman 62 y.o. male returns for regular follow up with his wife.  He feels well.  He denied all symptoms.  His left cervical incision wound has healed up nicely without problem.    Patient denies fever, anorexia, weight loss, fatigue, headache, visual changes, confusion, drenching night sweats, palpable lymph node swelling, mucositis, odynophagia, dysphagia, nausea vomiting, jaundice, chest pain, palpitation, shortness of breath, dyspnea on exertion, productive cough, gum bleeding, epistaxis, hematemesis, hemoptysis, abdominal pain, abdominal swelling, early satiety, melena, hematochezia, hematuria, skin rash, spontaneous bleeding, joint swelling, joint pain, heat or cold intolerance, bowel bladder incontinence, back pain, focal motor weakness, paresthesia, depression.    Past Medical History  Diagnosis Date  . DM (diabetes mellitus)     type II.   [name] GERD (gastroesophageal reflux disease)   . Hyperlipidemia   . Microscopic hematuria     referred to urology 2012  . Myocardial infarction 2003    with both PCI, and CABG  . BPH (benign prostatic hyperplasia)   . Arthritis   . Anxiety   . Depression   . Hypertension     sees Dr  Clarene Critchley. [place]  . Diverticulosis   . Buccal mucosa squamous cell carcinoma     Past Surgical History  Procedure Laterality Date  . Fracture surgery      RT TALAR REPAIR  . Cardiac catheterization  05/2010  . Cardiovascular stress test  07/2009  . Coronary angioplasty  2003; 2006    2 stents in heart  . Coronary artery  bypass graft  01/2003    2 vessels  . Cystoscopy  02/09/2011    Procedure: CYSTOSCOPY;  Surgeon: Ky Barban, MD;  Location: [facility];  Service: Urology;  Laterality: N/A;  . Colonoscopy  01/2011    polyp; next due 2018.   . Radical neck dissection Left 03/13/2012    Procedure: RADICAL NECK DISSECTION;  Surgeon: Drema Halon, MD;  Location: [facility] OR;  Service: ENT;  Laterality: Left;  . Tooth extraction N/A 03/13/2012    Procedure: EXTRACTION MOLARS;  Surgeon: Drema Halon, MD;  Location: [facility] OR;  Service: ENT;  Laterality: N/A;  . Mass excision N/A 03/13/2012    Procedure: EXCISION LEFT BUCCAL MUCOSA;  Surgeon: Drema Halon, MD;  Location: [facility] OR;  Service: ENT;  Laterality: N/A;    Current Outpatient Prescriptions  Medication Sig Dispense Refill  . aspirin 81 MG tablet Take 81 mg by mouth daily.      . enalapril (VASOTEC) 10 MG tablet Take 10 mg by mouth 2 (two) times daily.      [name] Hyaluronic Acid-Vitamin C (HYALURONIC ACID PO) Take 80 mg by mouth daily. Takes 1 tablet daily      . metoprolol succinate (TOPROL-XL) 25 MG 24 hr tablet Take 25 mg by mouth at bedtime.      . nitroGLYCERIN (NITROSTAT) 0.4 MG SL tablet Place 1 tablet (0.4 mg total) under the tongue every 5 (five) minutes as needed. Chest pain  25 tablet  1  . omeprazole (PRILOSEC) 40 MG  capsule Take 40 mg by mouth daily.       . ondansetron (ZOFRAN) 8 MG tablet Take 8 mg by mouth every 12 (twelve) hours as needed.      [name] OVER THE COUNTER MEDICATION Take 2 tablets by mouth daily.       [name] OVER THE COUNTER MEDICATION Take 20 mg by mouth daily. ALOE      . prochlorperazine (COMPAZINE) 10 MG tablet Take 10 mg by mouth every 6 (six) hours as needed.      . Saw Palmetto, Serenoa repens, 320 MG CAPS Take 1 capsule by mouth daily.      . simvastatin (ZOCOR) 40 MG tablet Take 1 tablet (40 mg total) by mouth at bedtime.  90 tablet  0  . sodium fluoride (FLUORISHIELD) 1.1 % GEL dental gel Brush and floss. Instill one  drop of fluoride per tooth space of fluoride tray. Place over teeth for 5 minutes. Remove trays. Spit out excess. Do not rinse afterwards. Repeat nightly.  120 mL  11  . Vitamins C E (VITAMIN C & E COMPLEX) 500-400 MG-UNIT CAPS Take 1 capsule by mouth daily.      . Saxagliptin-Metformin 05-998 MG TB24 Take 1 tablet by mouth daily.       No current facility-administered medications for this visit.   Facility-Administered Medications Ordered in Other Visits  Medication Dose Route Frequency Provider Last Rate Last Dose  . CISplatin (PLATINOL) 200 mg in sodium chloride 0.9 % 500 mL chemo infusion  99 mg/m2 (Treatment Plan Actual) Intravenous Once Exie Parody, MD        ALLERGIES:  has No Known Allergies.  REVIEW OF SYSTEMS:  The rest of the 14-point review of system was negative.   Filed Vitals:   04/22/12 0928  BP: 156/83  Pulse: 53  Temp: 96.9 F (36.1 C)  Resp: 18   Wt Readings from Last 3 Encounters:  04/22/12 186 lb (84.369 kg)  04/03/12 187 lb 8 oz (85.049 kg)  04/01/12 188 lb (85.276 kg)   ECOG Performance status: 0  PHYSICAL EXAMINATION:   General:  well-nourished man, in no acute distress.  Eyes:  no scleral icterus.  ENT:  There were no oropharyngeal lesions. There was no left buccal mucosal abnormality. Neck was without thyromegaly.  Left neck dissection has healed.  Lymphatics:  Negative cervical, supraclavicular or axillary adenopathy.  Respiratory: lungs were clear bilaterally without wheezing or crackles.  Cardiovascular:  Regular rate and rhythm, S1/S2, without murmur, rub or gallop.  There was no pedal edema.  GI:  abdomen was soft, flat, nontender, nondistended, without organomegaly.  Muscoloskeletal:  no spinal tenderness of palpation of vertebral spine.  Skin exam was without echymosis, petichae.  Neuro exam was nonfocal.  Patient was able to get on and off exam table without assistance.  Gait was normal.  Patient was alerted and oriented.  Attention was good.   Language  was appropriate.  Mood was normal without depression.  Speech was not pressured.  Thought content was not tangential.         LABORATORY/RADIOLOGY DATA:  Lab Results  Component Value Date   WBC 6.3 04/22/2012   HGB 13.6 04/22/2012   HCT 41.0 04/22/2012   PLT 119* 04/22/2012   GLUCOSE 208* 04/22/2012   CHOL 144 12/04/2011   TRIG 126 12/04/2011   HDL 35* 12/04/2011   LDLCALC 84 12/04/2011   ALKPHOS 96 04/22/2012   ALT 25 04/22/2012   AST 25 04/22/2012  NA 140 04/22/2012   K 4.2 04/22/2012   CL 101 04/22/2012   CREATININE 1.4* 04/22/2012   BUN 16.2 04/22/2012   CO2 26 04/22/2012   PSA 0.58 08/22/2010   INR 1.00 04/10/2012   HGBA1C 7.1* 12/04/2011   MICROALBUR 0.92 12/04/2011     ASSESSMENT AND PLAN:   1.  Hypertension:  On enalapril, metoprolol.  2.  Hyperlipidemia:  On simvastatin. 3.  Diabetes mellitus, type II:  On saxagliptin/metformin. 4.  History of CAD:  On ASA,  enalapril, metoprolol, simvastatin.  5.  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  -  Treatment: once every 3 week cisplatin chemotherapy and daily radiation -  Mouth sore: mouth rinse salt/baking soda (1 teaspoon each in 1 liter of water) 3x/day after meal.  -  Pain:  When pain from mouth sore occurs, we will be aggressive with pain med.  -  To prevent esophageal stricture: Please performs swallowing exercise as much as possible during the day at least 3 times a day. - He has Compazine/Zofran/Ativan prn nausea/vomiting.      The length of time of the face-to-face encounter was 15 minutes. More than 50% of time was spent counseling and coordination of care.

## 2012-04-22 NOTE — Patient Instructions (Addendum)
1. Diagnosis: Head and neck cancer. 2. Treatment: once every 3 week cisplatin chemotherapy and daily radiation 3. Mouth sore: mouth rinse salt/baking soda (1 teaspoon each in 1 liter of water) 3x/day after meal.  4. Pain:  When pain from mouth sore comes one, we will be aggressive with pain med.  5. To prevent esophageal stricture: Please performs swallowing exercise as much as possible during the day at least 3 times a day. If you experience nausea and vomiting with chemotherapy, please take your prescribed medications.  These medications should NOT be taken at the same times.  Take one by itself, and if the first one does not work in about 2-3 hours, take a different medications.  In other words, they should be rotated.   Prochloroperzaine (Brand name:  Compazine):  10mg  by mouth, every 6 hours as needed.  Odansetron (Brand name:  Zofran):  4-8 mg by mouth or under the tongue, every 8-12 hours as needed. Lorazepam (Brand name:  Ativan):  0.5mg  by mouth or under the tongue, every 6 hours; especially before meal for anticipatory nausea and vomiting.

## 2012-04-22 NOTE — Progress Notes (Signed)
Patient is receiving his first chemotherapy today. He reports he is doing well. He is eating well. Weight was documented as 186 pounds heart 31st which is down slightly from 187.5 pounds March 12. He has no nutrition side effects at this time. Patient is flushing feeding tube and denies problems at this time.  Nutrition diagnosis: Predicted suboptimal energy intake continues.  Intervention: I have educated patient and wife again on strategies for increased calories and protein to promote weight maintenance. He was educated to increase overall fluid intake.  Monitoring, evaluation, goals: Patient will tolerate increased oral intake to minimize weight loss. Tube feedings will be initiated once patient has lost 5% of his usual body weight.  Next visit: Monday, April 21, during chemotherapy. Patient has my contact information if he needs to see me sooner.

## 2012-04-22 NOTE — Patient Instructions (Signed)
[facility] Discharge Instructions for Patients Receiving Chemotherapy  Today you received the following chemotherapy agents: cisplatin  To help prevent nausea and vomiting after your treatment, we encourage you to take your nausea medication.  Take it as often as prescribed.    If you develop nausea and vomiting that is not controlled by your nausea medication, call the clinic. If it is after clinic hours your family physician or the after hours number for the clinic or go to the Emergency Department.   BELOW ARE SYMPTOMS THAT SHOULD BE REPORTED IMMEDIATELY:  *FEVER GREATER THAN 100.5 F  *CHILLS WITH OR WITHOUT FEVER  NAUSEA AND VOMITING THAT IS NOT CONTROLLED WITH YOUR NAUSEA MEDICATION  *UNUSUAL SHORTNESS OF BREATH  *UNUSUAL BRUISING OR BLEEDING  TENDERNESS IN MOUTH AND THROAT WITH OR WITHOUT PRESENCE OF ULCERS  *URINARY PROBLEMS  *BOWEL PROBLEMS  UNUSUAL RASH Items with * indicate a potential emergency and should be followed up as soon as possible.  One of the nurses will contact you 24 hours after your treatment. Please let the nurse know about any problems that you may have experienced. Feel free to call the clinic you have any questions or concerns. The clinic phone number is [phone].   I have been informed and understand all the instructions given to me. I know to contact the clinic, my physician, or go to the Emergency Department if any problems should occur. I do not have any questions at this time, but understand that I may call the clinic during office hours   should I have any questions or need assistance in obtaining follow up care.    __________________________________________  _____________  __________ Signature of Patient or Authorized Representative            Date                   Time    __________________________________________ Nurse's Signature  Cisplatin injection What is this medicine? CISPLATIN (SIS pla tin) is a  chemotherapy drug. It targets fast dividing cells, like cancer cells, and causes these cells to die. This medicine is used to treat many types of cancer like bladder, ovarian, and testicular cancers. This medicine may be used for other purposes; ask your health care provider or pharmacist if you have questions. What should I tell my health care provider before I take this medicine? They need to know if you have any of these conditions: -blood disorders -hearing problems -kidney disease -recent or ongoing radiation therapy -an unusual or allergic reaction to cisplatin, carboplatin, other chemotherapy, other medicines, foods, dyes, or preservatives -pregnant or trying to get pregnant -breast-feeding How should I use this medicine? This drug is given as an infusion into a vein. It is administered in a hospital or clinic by a specially trained health care professional. Talk to your pediatrician regarding the use of this medicine in children. Special care may be needed. Overdosage: If you think you have taken too much of this medicine contact a poison control center or emergency room at once. NOTE: This medicine is only for you. Do not share this medicine with others. What if I miss a dose? It is important not to miss a dose. Call your doctor or health care professional if you are unable to keep an appointment. What may interact with this medicine? -dofetilide -foscarnet -medicines for seizures -medicines to increase blood counts like filgrastim, pegfilgrastim, sargramostim -probenecid -pyridoxine used with altretamine -rituximab -some antibiotics like amikacin, gentamicin, neomycin,  polymyxin B, streptomycin, tobramycin -sulfinpyrazone -vaccines -zalcitabine Talk to your doctor or health care professional before taking any of these medicines: -acetaminophen -aspirin -ibuprofen -ketoprofen -naproxen This list may not describe all possible interactions. Give your health care provider a  list of all the medicines, herbs, non-prescription drugs, or dietary supplements you use. Also tell them if you smoke, drink alcohol, or use illegal drugs. Some items may interact with your medicine. What should I watch for while using this medicine? Your condition will be monitored carefully while you are receiving this medicine. You will need important blood work done while you are taking this medicine. This drug may make you feel generally unwell. This is not uncommon, as chemotherapy can affect healthy cells as well as cancer cells. Report any side effects. Continue your course of treatment even though you feel ill unless your doctor tells you to stop. In some cases, you may be given additional medicines to help with side effects. Follow all directions for their use. Call your doctor or health care professional for advice if you get a fever, chills or sore throat, or other symptoms of a cold or flu. Do not treat yourself. This drug decreases your body's ability to fight infections. Try to avoid being around people who are sick. This medicine may increase your risk to bruise or bleed. Call your doctor or health care professional if you notice any unusual bleeding. Be careful brushing and flossing your teeth or using a toothpick because you may get an infection or bleed more easily. If you have any dental work done, tell your dentist you are receiving this medicine. Avoid taking products that contain aspirin, acetaminophen, ibuprofen, naproxen, or ketoprofen unless instructed by your doctor. These medicines may hide a fever. Do not become pregnant while taking this medicine. Women should inform their doctor if they wish to become pregnant or think they might be pregnant. There is a potential for serious side effects to an unborn child. Talk to your health care professional or pharmacist for more information. Do not breast-feed an infant while taking this medicine. Drink fluids as directed while you are  taking this medicine. This will help protect your kidneys. Call your doctor or health care professional if you get diarrhea. Do not treat yourself. What side effects may I notice from receiving this medicine? Side effects that you should report to your doctor or health care professional as soon as possible: -allergic reactions like skin rash, itching or hives, swelling of the face, lips, or tongue -signs of infection - fever or chills, cough, sore throat, pain or difficulty passing urine -signs of decreased platelets or bleeding - bruising, pinpoint red spots on the skin, black, tarry stools, nosebleeds -signs of decreased red blood cells - unusually weak or tired, fainting spells, lightheadedness -breathing problems -changes in hearing -gout pain -low blood counts - This drug may decrease the number of white blood cells, red blood cells and platelets. You may be at increased risk for infections and bleeding. -nausea and vomiting -pain, swelling, redness or irritation at the injection site -pain, tingling, numbness in the hands or feet -problems with balance, movement -trouble passing urine or change in the amount of urine Side effects that usually do not require medical attention (report to your doctor or health care professional if they continue or are bothersome): -changes in vision -loss of appetite -metallic taste in the mouth or changes in taste This list may not describe all possible side effects. Call your doctor for medical  advice about side effects. You may report side effects to FDA at 1-800-FDA-1088. Where should I keep my medicine? This drug is given in a hospital or clinic and will not be stored at home. NOTE: This sheet is a summary. It may not cover all possible information. If you have questions about this medicine, talk to your doctor, pharmacist, or health care provider.  2012, [employer]. (04/16/2007 2:40:54 PM)

## 2012-04-23 ENCOUNTER — Ambulatory Visit
Admission: RE | Admit: 2012-04-23 | Discharge: 2012-04-23 | Disposition: A | Discharge status: Home / Self Care | Payer: Medicare HMO | Source: Ambulatory Visit | Attending: Radiation Oncology | Admitting: Radiation Oncology

## 2012-04-23 ENCOUNTER — Telehealth: Payer: Self-pay | Admitting: *Deleted

## 2012-04-23 NOTE — Telephone Encounter (Signed)
Spoke with patient.  He did well yesterday.  He had some nausea in the middle of the night and he took some lorazepam.  It helped, although it did make him sleepy.  He is doing fine now.  Reviewed his anti-emetics with him.  He had aloxi and emend yesterday, so he knows to wait for three days before starting zofran.  His other complaints included a dry mouth.  Suggested he try the Biotene.  He is familiar with this and will try this.  Also suggested he talk with the radiation therapist today when he comes.  He is sipping fluids.  He had coffee, apple juice and water.  Reminded him not to count his caffeinated coffee as part of his daily 64 oz that we recommend.  His last BM was Sunday.  Gave him some suggestion for constipation.  He will try some generic senekot-S and let us know if that is not helpful.  He said milk usually works for him and told him that was fine to try.  Reviewed with him how to call us for general questions and how to get in touch with the physician  If he has a problem after hours.  Encouraged him to call us during the day for any questions.  He appreciated the call.

## 2012-04-24 ENCOUNTER — Encounter: Payer: Self-pay | Admitting: Radiation Oncology

## 2012-04-24 ENCOUNTER — Ambulatory Visit
Admission: RE | Admit: 2012-04-24 | Discharge: 2012-04-24 | Disposition: A | Discharge status: Home / Self Care | Payer: Medicare HMO | Source: Ambulatory Visit | Attending: Radiation Oncology | Admitting: Radiation Oncology

## 2012-04-24 ENCOUNTER — Encounter: Payer: Self-pay | Admitting: Oncology

## 2012-04-24 VITALS — BP 155/88 | HR 54 | Temp 97.7°F | Resp 20 | Wt 189.0 lb

## 2012-04-24 DIAGNOSIS — C060 Malignant neoplasm of cheek mucosa: Secondary | ICD-10-CM

## 2012-04-24 NOTE — Progress Notes (Addendum)
Patient, here weekly rad tx 3/30 completed, patient education done, radiaplex gel given, patient using biotene, eating softer foods,, no difficulty swallowing , incision left side neck well healed, slight erythema, skin intact, patient to use radiaplex gel after rad tx daily , and in am, just not 4 h prior to rad tx, patient c/o is constant hiccups,  Since Monday, not getting sleep, making chest hurt some from this, , fatigue, skin, diet discussed, seesd MD weekly/prn, can call for any questions concerns, dry mouth better with the biotene , peg tube flushes with free water,, 60 cc flushed via gravity, no difficulty,, radiation therapy and you book given with Alphia Moh business card, will e-mail Zenovia Jarred, wife has questions when to start peg feeding cans and how much,  2:36 PM  2:25 PM

## 2012-04-24 NOTE — Progress Notes (Signed)
   Weekly Management Note:  Outpatient Current Dose:  6 Gy  Projected Dose: 60 Gy   Narrative:  The patient presents for routine under treatment assessment.  CBCT/MVCT images/Port film x-rays were reviewed.  The chart was checked. He is starting to have hiccups and this is affecting his sleep. Chemo given Monday.  Physical Findings:  weight is 189 lb (85.73 kg). His oral temperature is 97.7 F (36.5 C). His blood pressure is 155/88 and his pulse is 54. His respiration is 20.  NAD, not hiccupping currently  CBC    Component Value Date/Time   WBC 6.3 04/22/2012 0811   WBC 5.2 04/10/2012 0740   RBC 4.97 04/22/2012 0811   RBC 4.60 04/10/2012 0740   HGB 13.6 04/22/2012 0811   HGB 12.5* 04/10/2012 0740   HCT 41.0 04/22/2012 0811   HCT 37.9* 04/10/2012 0740   PLT 119* 04/22/2012 0811   PLT 157 04/10/2012 0740   MCV 82.5 04/22/2012 0811   MCV 82.4 04/10/2012 0740   MCH 27.4 04/22/2012 0811   MCH 27.2 04/10/2012 0740   MCHC 33.2 04/22/2012 0811   MCHC 33.0 04/10/2012 0740   RDW 12.8 04/22/2012 0811   RDW 12.8 04/10/2012 0740   LYMPHSABS 1.9 04/22/2012 0811   MONOABS 0.5 04/22/2012 0811   EOSABS 0.6* 04/22/2012 0811   BASOSABS 0.1 04/22/2012 0811    CMP     Component Value Date/Time   NA 140 04/22/2012 0810   NA 137 04/10/2012 0740   K 4.2 04/22/2012 0810   K 3.6 04/10/2012 0740   CL 101 04/22/2012 0810   CL 99 04/10/2012 0740   CO2 26 04/22/2012 0810   CO2 25 04/10/2012 0740   GLUCOSE 208* 04/22/2012 0810   GLUCOSE 130* 04/10/2012 0740   BUN 16.2 04/22/2012 0810   BUN 14 04/10/2012 0740   CREATININE 1.4* 04/22/2012 0810   CREATININE 1.11 04/10/2012 0740   CREATININE 1.23 12/04/2011 0921   CALCIUM 10.2 04/22/2012 0810   CALCIUM 9.5 04/10/2012 0740   PROT 9.2* 04/22/2012 0810   PROT 8.1 03/04/2012 1455   ALBUMIN 4.3 04/22/2012 0810   ALBUMIN 4.9 03/04/2012 1455   AST 25 04/22/2012 0810   AST 24 03/04/2012 1455   ALT 25 04/22/2012 0810   ALT 28 03/04/2012 1455   ALKPHOS 96 04/22/2012 0810   ALKPHOS 59  03/04/2012 1455   BILITOT 0.34 04/22/2012 0810   BILITOT 0.5 03/04/2012 1455   GFRNONAA 69* 04/10/2012 0740   GFRAA 80* 04/10/2012 0740      Impression:  The patient is tolerating radiotherapy.  Plan:  Continue radiotherapy as planned. 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 Suggested talking to Med/Onc if he would like to start medication for this. But, I told him medication would not be without risk of side effects.  He thinks hiccups are improving... Will try to wait it out.  ________________________________   Lonie Peak, M.D.

## 2012-04-25 ENCOUNTER — Telehealth: Payer: Self-pay | Admitting: *Deleted

## 2012-04-25 ENCOUNTER — Ambulatory Visit
Admission: RE | Admit: 2012-04-25 | Discharge: 2012-04-25 | Disposition: A | Discharge status: Home / Self Care | Payer: Medicare HMO | Source: Ambulatory Visit | Attending: Radiation Oncology | Admitting: Radiation Oncology

## 2012-04-25 ENCOUNTER — Other Ambulatory Visit: Payer: Self-pay | Admitting: Oncology

## 2012-04-25 DIAGNOSIS — R066 Hiccough: Secondary | ICD-10-CM

## 2012-04-25 MED ORDER — BACLOFEN 10 MG PO TABS: 5.0000 mg | ORAL_TABLET | Freq: Three times a day (TID) | ORAL | Status: DC | PRN

## 2012-04-25 NOTE — Telephone Encounter (Signed)
Dr. Gaylyn Rong prescribed Baclofen for pt's c/o hiccoughs.  Called cell phone and s/w wife.  Informed her of new order and will send electronically to [facility] on [facility]. She verbalized understanding.

## 2012-04-26 ENCOUNTER — Telehealth: Payer: Self-pay | Admitting: Nutrition

## 2012-04-26 ENCOUNTER — Ambulatory Visit
Admission: RE | Admit: 2012-04-26 | Discharge: 2012-04-26 | Disposition: A | Discharge status: Home / Self Care | Payer: Medicare HMO | Source: Ambulatory Visit | Attending: Radiation Oncology | Admitting: Radiation Oncology

## 2012-04-26 NOTE — Telephone Encounter (Signed)
Was asked to contact patient secondary to questions regarding when to initiate tube feeding. Chart was reviewed and patient's weight has increased to 189 pounds from 186 pounds on his oral diet. Patient does not likely need to start tube feeding yet. I left my contact information for patient or wife to call me if they have further questions. Nutrition followup is scheduled for Monday, April 21.

## 2012-04-29 ENCOUNTER — Ambulatory Visit: Payer: Medicare HMO | Admitting: Nutrition

## 2012-04-29 ENCOUNTER — Ambulatory Visit
Admission: RE | Admit: 2012-04-29 | Discharge: 2012-04-29 | Disposition: A | Discharge status: Home / Self Care | Payer: Medicare HMO | Source: Ambulatory Visit | Attending: Radiation Oncology | Admitting: Radiation Oncology

## 2012-04-29 VITALS — BP 143/77 | HR 61 | Temp 98.3°F | Ht 68.0 in | Wt 182.8 lb

## 2012-04-29 DIAGNOSIS — C060 Malignant neoplasm of cheek mucosa: Secondary | ICD-10-CM

## 2012-04-29 MED ORDER — MAGIC MOUTHWASH W/LIDOCAINE: ORAL | Status: DC

## 2012-04-29 MED ORDER — SUCRALFATE 1 G PO TABS: 1.0000 g | ORAL_TABLET | Freq: Four times a day (QID) | ORAL | Status: DC

## 2012-04-29 MED ORDER — HYDROCODONE-ACETAMINOPHEN 7.5-325 MG/15ML PO SOLN: 15.0000 mL | ORAL | Status: DC | PRN

## 2012-04-29 NOTE — Progress Notes (Signed)
Patient's wife called me on the telephone. She reports patient is developing mouth sores. He is eating less than he was. Patient has not been weighed since April 2 at which time he had increased his weight to 189 pounds. I obtained a dietary recall from patient's wife. Patient appears to be consuming approximately 50% of his estimated caloric needs over the weekend.  Estimated nutrition needs: 2130-2450 calories, 115-130 g protein, 2.3 L fluid.  Nutrition diagnosis: Predicted suboptimal energy intake has evolved into inadequate oral intake related to head and neck cancer and associated treatments as evidenced by 24 dietary recall.  Intervention: I have educated patient's wife to begin boost plus or Ensure Plus via feeding tube 3 times a day between meals. She is to flush feeding tube with 60 cc of water before and after each bolus feeding. She was instructed to take approximately 15-20 minutes to administer bolus feedings. Patient's wife able to repeat back instructions.  Teach back method used.  Monitoring, evaluation, goals: Patient will tolerate oral intake plus tube feedings to minimize weight loss.   Next visit: Monday, April 21 during chemotherapy.

## 2012-04-29 NOTE — Patient Instructions (Addendum)
Baking Soda Rinse - 1 tsp salt, 1 tsp baking soda, 1 qt water - swish and spit as needed to soothe/cleanse mouth.  Sucralfate - crush 1 tablet in 10 mL H20 and swallow up to four times a day to soothe throat.  Magic mouthwash with Lidocaine - Swish/swallow 10mL, 30 minutes before meals and bedtime, to numb up sore mouth and/or throat.  Hycet - pain medication, can constipate - use stool softeners/Miralax to address constipation.

## 2012-04-29 NOTE — Progress Notes (Signed)
Weekly Management Note: outpatient Current Dose:  12 Gy  Projected Dose: 60 Gy   Narrative:  The patient presents for routine under treatment assessment.  CBCT/MVCT images/Port film x-rays were reviewed.  The chart was checked. He complains of increased jaw stiffness. He has increased irritation in the left side of his mouth. He notices a lesion in the mucosa of the left side of his mouth. He has some burning around his PEG tube. He is eating his foods.  Physical Findings:  height is 5\' 8"  (1.727 m) and weight is 182 lb 12.8 oz (82.918 kg). His temperature is 98.3 F (36.8 C). His blood pressure is 143/77 and his pulse is 61. His oxygen saturation is 100%.  yellow/white patchy change over the left buccal mucosa, in the region of prior surgery. No thrush. He does have trismus. PEG site clean, minimal erythema CBC    Component Value Date/Time   WBC 6.3 04/22/2012 0811   WBC 5.2 04/10/2012 0740   RBC 4.97 04/22/2012 0811   RBC 4.60 04/10/2012 0740   HGB 13.6 04/22/2012 0811   HGB 12.5* 04/10/2012 0740   HCT 41.0 04/22/2012 0811   HCT 37.9* 04/10/2012 0740   PLT 119* 04/22/2012 0811   PLT 157 04/10/2012 0740   MCV 82.5 04/22/2012 0811   MCV 82.4 04/10/2012 0740   MCH 27.4 04/22/2012 0811   MCH 27.2 04/10/2012 0740   MCHC 33.2 04/22/2012 0811   MCHC 33.0 04/10/2012 0740   RDW 12.8 04/22/2012 0811   RDW 12.8 04/10/2012 0740   LYMPHSABS 1.9 04/22/2012 0811   MONOABS 0.5 04/22/2012 0811   EOSABS 0.6* 04/22/2012 0811   BASOSABS 0.1 04/22/2012 0811    CMP     Component Value Date/Time   NA 140 04/22/2012 0810   NA 137 04/10/2012 0740   K 4.2 04/22/2012 0810   K 3.6 04/10/2012 0740   CL 101 04/22/2012 0810   CL 99 04/10/2012 0740   CO2 26 04/22/2012 0810   CO2 25 04/10/2012 0740   GLUCOSE 208* 04/22/2012 0810   GLUCOSE 130* 04/10/2012 0740   BUN 16.2 04/22/2012 0810   BUN 14 04/10/2012 0740   CREATININE 1.4* 04/22/2012 0810   CREATININE 1.11 04/10/2012 0740   CREATININE 1.23 12/04/2011 0921   CALCIUM 10.2  04/22/2012 0810   CALCIUM 9.5 04/10/2012 0740   PROT 9.2* 04/22/2012 0810   PROT 8.1 03/04/2012 1455   ALBUMIN 4.3 04/22/2012 0810   ALBUMIN 4.9 03/04/2012 1455   AST 25 04/22/2012 0810   AST 24 03/04/2012 1455   ALT 25 04/22/2012 0810   ALT 28 03/04/2012 1455   ALKPHOS 96 04/22/2012 0810   ALKPHOS 59 03/04/2012 1455   BILITOT 0.34 04/22/2012 0810   BILITOT 0.5 03/04/2012 1455   GFRNONAA 69* 04/10/2012 0740   GFRAA 80* 04/10/2012 0740     Impression:  The patient is tolerating radiotherapy.  Plan:  Continue radiotherapy as planned. 1) Made several Rx today and discussed the following: Baking Soda Rinse - 1 tsp salt, 1 tsp baking soda, 1 qt water - swish and spit as needed to soothe/cleanse mouth.  Sucralfate - crush 1 tablet in 10 mL H20 and swallow up to four times a day to soothe throat.  Magic mouthwash with Lidocaine - Swish/swallow 10mL, 30 minutes before meals and bedtime, to numb up sore mouth and/or throat.  Hycet - pain medication, can constipate - use stool softeners/Miralax to address constipation.  2) He will discuss trismus with  swallowing therapy this week  3) I recommended he call Dr. Ezzard Standing to rule out any abscess/infection at operative site.  It is quite early for so much irritation, trismus, and mucosal change. I want to make sure nothing else is going on.  ________________________________   Lonie Peak, M.D.

## 2012-04-29 NOTE — Progress Notes (Signed)
[name] here for weekly under treatment visit accompanied by his wife.  He has had 6/30 treatments to his left buccal mucosa.  He does have pain that he rates at a 9/10 in his left mouth/cheak.  He does have a yellow/white lesion on the left side.  He has been taking ibuprofen.  He also states he has some burning around his peg tube.  The peg site appears pink with no drainage.  The patient states he has seen yellowish/green drainage occasionally.  [name] also states his jaw is getting stiff.  He has been trying to do his exercises.  His appetite is good and he has been able to eat solids.  He saw Zenovia Jarred the dietician today.

## 2012-04-30 ENCOUNTER — Ambulatory Visit
Admission: RE | Admit: 2012-04-30 | Discharge: 2012-04-30 | Disposition: A | Discharge status: Home / Self Care | Payer: Medicare HMO | Source: Ambulatory Visit | Attending: Radiation Oncology | Admitting: Radiation Oncology

## 2012-05-01 ENCOUNTER — Ambulatory Visit (HOSPITAL_COMMUNITY): Payer: Self-pay | Admitting: Dentistry

## 2012-05-01 ENCOUNTER — Encounter: Payer: Self-pay | Admitting: Radiation Oncology

## 2012-05-01 ENCOUNTER — Encounter: Payer: Self-pay | Admitting: Oncology

## 2012-05-01 ENCOUNTER — Ambulatory Visit (HOSPITAL_BASED_OUTPATIENT_CLINIC_OR_DEPARTMENT_OTHER): Payer: Medicare HMO | Admitting: Oncology

## 2012-05-01 ENCOUNTER — Other Ambulatory Visit (HOSPITAL_BASED_OUTPATIENT_CLINIC_OR_DEPARTMENT_OTHER): Payer: Medicare HMO | Admitting: Lab

## 2012-05-01 ENCOUNTER — Encounter (HOSPITAL_COMMUNITY): Payer: Self-pay | Admitting: Dentistry

## 2012-05-01 ENCOUNTER — Ambulatory Visit
Admission: RE | Admit: 2012-05-01 | Discharge: 2012-05-01 | Disposition: A | Discharge status: Home / Self Care | Payer: Medicare HMO | Source: Ambulatory Visit | Attending: Radiation Oncology | Admitting: Radiation Oncology

## 2012-05-01 VITALS — BP 152/80 | HR 60 | Temp 98.2°F | Wt 182.0 lb

## 2012-05-01 VITALS — BP 172/81 | HR 63 | Temp 98.4°F | Resp 18 | Ht 68.0 in | Wt 182.9 lb

## 2012-05-01 DIAGNOSIS — R252 Cramp and spasm: Secondary | ICD-10-CM

## 2012-05-01 DIAGNOSIS — C060 Malignant neoplasm of cheek mucosa: Secondary | ICD-10-CM

## 2012-05-01 DIAGNOSIS — E119 Type 2 diabetes mellitus without complications: Secondary | ICD-10-CM

## 2012-05-01 DIAGNOSIS — K1370 Unspecified lesions of oral mucosa: Secondary | ICD-10-CM

## 2012-05-01 DIAGNOSIS — K1233 Oral mucositis (ulcerative) due to radiation: Secondary | ICD-10-CM

## 2012-05-01 DIAGNOSIS — R432 Parageusia: Secondary | ICD-10-CM

## 2012-05-01 DIAGNOSIS — R52 Pain, unspecified: Secondary | ICD-10-CM

## 2012-05-01 DIAGNOSIS — Z09 Encounter for follow-up examination after completed treatment for conditions other than malignant neoplasm: Secondary | ICD-10-CM

## 2012-05-01 LAB — CBC WITH DIFFERENTIAL/PLATELET
BASO%: 0.6 % (ref 0.0–2.0)
HCT: 36.4 % — ABNORMAL LOW (ref 38.4–49.9)
LYMPH%: 13.7 % — ABNORMAL LOW (ref 14.0–49.0)
MCHC: 34.1 g/dL (ref 32.0–36.0)
MCV: 80.5 fL (ref 79.3–98.0)
MONO#: 0.5 10*3/uL (ref 0.1–0.9)
MONO%: 10.4 % (ref 0.0–14.0)
NEUT%: 71.5 % (ref 39.0–75.0)
Platelets: 170 10*3/uL (ref 140–400)
WBC: 4.7 10*3/uL (ref 4.0–10.3)

## 2012-05-01 LAB — BASIC METABOLIC PANEL (CC13)
CO2: 28 mEq/L (ref 22–29)
Calcium: 9.8 mg/dL (ref 8.4–10.4)
Creatinine: 1.4 mg/dL — ABNORMAL HIGH (ref 0.7–1.3)
Glucose: 228 mg/dl — ABNORMAL HIGH (ref 70–99)

## 2012-05-01 MED ORDER — OXYCODONE HCL 5 MG/5ML PO SOLN: 5.0000 mg | Freq: Four times a day (QID) | ORAL | Status: DC | PRN

## 2012-05-01 NOTE — Progress Notes (Signed)
05/01/2012  Patient:            Gregory Friedman Date of Birth:  Nov 17, 1950 MRN:                [id number]  BP 152/80  Pulse 60  Temp(Src) 98.2 F (36.8 C) (Oral)  Wt 182 lb (82.555 kg)  BMI 27.68 kg/m2   Hassell Done presents for periodic oral examination during radiation therapy. Patient has completed 7/30 Treatments.  REVIEW OF CHIEF COMPLAINTS:  DRY MOUTH: Yes, but none today. HARD TO SWALLOW: No  HURT TO SWALLOW: No TASTE CHANGES: Patient still has some taste remaining SORES IN MOUTH: Yes, left cheek. TRISMUS: 18 mm of maximum interincisal opening. Patient stressed to do trismus exercises 2-3 times a day to increase his maximum interincisal opening WEIGHT: 182 pounds  HOME OH REGIMEN:  BRUSHING: Yes FLOSSING: Sometimes RINSING: Using salt water and baking soda rinses, Biotene rinses, and magic mouthwash. FLUORIDE: At bedtime TRISMUS EXERCISES:  Maximum interincisal opening: 18 mm   DENTAL EXAM:  Oral Hygiene:(PLAQUE): Plaque was noted. Oral hygiene was stressed LOCATION OF MUCOSITIS: Left buccal mucosa DESCRIPTION OF SALIVA: Decreased and foamy saliva. Incipient xerostomia ANY EXPOSED BONE: None noted OTHER WATCHED AREAS: Left buccal mucosa and upper left molar extraction site. DX: Xerostomia, Dysgeusia, Weight Loss, Accretions, Trimus and Mucositis  RECOMMENDATIONS: 1. Brush after meals and at bedtime.  Use fluoride at bedtime. 2. Use trismus exercises as directed. Suggest increasing exercises to 2-3 times a day 3. Use Biotene Rinse or salt water/baking soda rinses. 4. Multiple sips of water as needed. 5. Return to clinic in two months for periodic oral exam after radiation therapy. Call if problems before then.  Charlynne Pander, DDS 05/01/2012

## 2012-05-01 NOTE — Patient Instructions (Addendum)
RECOMMENDATIONS: 1. Brush after meals and at bedtime.  Use fluoride at bedtime. 2. Use trismus exercises as directed. Suggest increasing exercises to 2-3 times a day 3. Use Biotene Rinse or salt water/baking soda rinses. 4. Multiple sips of water as needed. 5. Return to clinic in two months for periodic oral exam after radiation therapy. Call if problems before then.  Dr. Kristin Bruins TRISMUS  Trismus is a condition where the jaw does not allow the mouth to open as wide as it usually does.  This can happen almost suddenly, or in other cases the process is so slow, it is hard to notice it-until it is too far along.  When the jaw joints and/or muscles have been exposed to radiation treatments, the onset of Trismus is very slow.  This is because the muscles are losing their stretching ability over a long period of time, as long as 2 YEARS after the end of radiation.  It is therefore important to exercise these muscles and joints.  TRISMUS EXERCISES   Stack of tongue depressors measuring the same or a little less than the last documented MIO (Maximum Interincisal Opening).  Secure them with a rubber band on both ends.  Place the stack in the patient's mouth, supporting the other end.  Allow 30 seconds for muscle stretching.  Rest for a few seconds.  Repeat 3-5 times  For all radiation patients, this exercise is recommended in the mornings and evenings unless otherwise instructed.  The exercise should be done for a period of 2 YEARS after the end of radiation.  MIO should be checked routinely on recall dental visits by the general dentist or the hospital dentist.  The patient is advised to report any changes, soreness, or difficulties encountered when doing the exercises.

## 2012-05-01 NOTE — Progress Notes (Signed)
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   PAST THERAPY:  Wide excision of left buccal mucosa squamous cell carcinoma with left modified neck dissection.   CURRENT THERAPY: Adjuvant chemo q3week Cisplatin and daily radiation started 04/22/2012.  INTERVAL HISTORY: Gregory Friedman 62 y.o. male returns for regular follow up with his wife.  States that he is having more sores in the back of his mouth. He has been using the salt and baking soda rinses 4 or 5 times a day along with biotene. He was recently prescribed Magic mouthwash. States that he is not using his hydrocodone frequently as it causes burning in his stomach. He also does not think that the hydrocodone is effective. States that he is taking ibuprofen 600 mg every 4 hours to help with his pain. Denies chest pain, shortness of breath, dyspnea. He developed nausea for several days following his chemotherapy without vomiting. States that he is started putting about 3 cans of Ensure per day through his PEG tube. Weight is down approximately 3-4 pounds in 10 days. Using Senokot for constipation.   Patient denies fever, anorexia, weight loss, fatigue, headache, visual changes, confusion, drenching night sweats, palpable lymph node swelling, mucositis, odynophagia, dysphagia, nausea vomiting, jaundice, chest pain, palpitation, shortness of breath, dyspnea on exertion, productive cough, gum bleeding, epistaxis, hematemesis, hemoptysis, abdominal pain, abdominal swelling, early satiety, melena, hematochezia, hematuria, skin rash, spontaneous bleeding, joint swelling, joint pain, heat or cold intolerance, bowel bladder incontinence, back pain, focal motor weakness, paresthesia, depression.    Past Medical History  Diagnosis Date  . DM (diabetes mellitus)     type II.   [name] GERD  (gastroesophageal reflux disease)   . Hyperlipidemia   . Microscopic hematuria     referred to urology 2012  . Myocardial infarction 2003    with both PCI, and CABG  . BPH (benign prostatic hyperplasia)   . Arthritis   . Anxiety   . Depression   . Hypertension     sees Dr  Clarene Critchley. [place]  . Diverticulosis   . Buccal mucosa squamous cell carcinoma     Past Surgical History  Procedure Laterality Date  . Fracture surgery      RT TALAR REPAIR  . Cardiac catheterization  05/2010  . Cardiovascular stress test  07/2009  . Coronary angioplasty  2003; 2006    2 stents in heart  . Coronary artery bypass graft  01/2003    2 vessels  . Cystoscopy  02/09/2011    Procedure: CYSTOSCOPY;  Surgeon: Ky Barban, MD;  Location: [facility];  Service: Urology;  Laterality: N/A;  . Colonoscopy  01/2011    polyp; next due 2018.   . Radical neck dissection Left 03/13/2012    Procedure: RADICAL NECK DISSECTION;  Surgeon: Drema Halon, MD;  Location: [facility] OR;  Service: ENT;  Laterality: Left;  . Tooth extraction N/A 03/13/2012    Procedure: EXTRACTION MOLARS;  Surgeon: Drema Halon, MD;  Location: [facility] OR;  Service: ENT;  Laterality: N/A;  . Mass excision N/A 03/13/2012    Procedure: EXCISION LEFT BUCCAL MUCOSA;  Surgeon: Drema Halon, MD;  Location: [facility] OR;  Service: ENT;  Laterality: N/A;    Current Outpatient Prescriptions  Medication Sig Dispense Refill  . Alum & Mag Hydroxide-Simeth (MAGIC MOUTHWASH W/LIDOCAINE) SOLN 1part nystatin,1part Maalox  plus,1part benadryl,3part 2%viscous lidocaine. Swish/swallow 10mL up to QID, before meals/bedtime  480 mL  5  . aspirin 81 MG tablet Take 81 mg by mouth daily.      . baclofen (LIORESAL) 10 MG tablet Take 0.5 tablets (5 mg total) by mouth 3 (three) times daily as needed (hiccup).  90 each  0  . enalapril (VASOTEC) 10 MG tablet Take 10 mg by mouth 2 (two) times daily.      [name] Hyaluronic Acid-Vitamin C (HYALURONIC ACID PO) Take 80 mg by  mouth daily. Takes 1 tablet daily      . HYDROcodone-acetaminophen (HYCET) 7.5-325 mg/15 ml solution Take 15 mLs by mouth every 4 (four) hours as needed for pain.  473 mL  3  . metoprolol succinate (TOPROL-XL) 25 MG 24 hr tablet Take 25 mg by mouth at bedtime.      . nitroGLYCERIN (NITROSTAT) 0.4 MG SL tablet Place 1 tablet (0.4 mg total) under the tongue every 5 (five) minutes as needed. Chest pain  25 tablet  1  . omeprazole (PRILOSEC) 40 MG capsule Take 40 mg by mouth daily.       . ondansetron (ZOFRAN) 8 MG tablet Take 8 mg by mouth every 12 (twelve) hours as needed.      [name] OVER THE COUNTER MEDICATION Take 2 tablets by mouth daily.       [name] OVER THE COUNTER MEDICATION Take 20 mg by mouth daily. ALOE      . oxyCODONE (ROXICODONE) 5 MG/5ML solution Take 5 mLs (5 mg total) by mouth every 6 (six) hours as needed for pain.  500 mL  0  . prochlorperazine (COMPAZINE) 10 MG tablet Take 10 mg by mouth every 6 (six) hours as needed.      . Saw Palmetto, Serenoa repens, 320 MG CAPS Take 1 capsule by mouth daily.      . Saxagliptin-Metformin 05-998 MG TB24 Take 1 tablet by mouth daily.      . simvastatin (ZOCOR) 40 MG tablet Take 1 tablet (40 mg total) by mouth at bedtime.  90 tablet  0  . sodium fluoride (FLUORISHIELD) 1.1 % GEL dental gel Brush and floss. Instill one drop of fluoride per tooth space of fluoride tray. Place over teeth for 5 minutes. Remove trays. Spit out excess. Do not rinse afterwards. Repeat nightly.  120 mL  11  . sucralfate (CARAFATE) 1 G tablet Take 1 tablet (1 g total) by mouth 4 (four) times daily. crush 1 tablet in 10 mL H20 and swallow up to QID to soothe throat  30 tablet  5  . Vitamins C E (VITAMIN C & E COMPLEX) 500-400 MG-UNIT CAPS Take 1 capsule by mouth daily.       No current facility-administered medications for this visit.    ALLERGIES:  has No Known Allergies.  REVIEW OF SYSTEMS:  The rest of the 14-point review of system was negative.   Filed Vitals:   05/01/12  1431  BP: 172/81  Pulse: 63  Temp: 98.4 F (36.9 C)  Resp: 18   Wt Readings from Last 3 Encounters:  05/01/12 182 lb 14.4 oz (82.963 kg)  05/01/12 182 lb (82.555 kg)  04/29/12 182 lb 12.8 oz (82.918 kg)   ECOG Performance status: 0  PHYSICAL EXAMINATION:   General:  well-nourished man, in no acute distress.  Eyes:  no scleral icterus.  ENT:  There were no oropharyngeal lesions. There was no left buccal mucosal abnormality. Neck was without thyromegaly.  Left neck dissection has healed.  Lymphatics:  Negative cervical, supraclavicular or axillary adenopathy.  Respiratory: lungs were clear bilaterally without wheezing or crackles.  Cardiovascular:  Regular rate and rhythm, S1/S2, without murmur, rub or gallop.  There was no pedal edema.  GI:  abdomen was soft, flat, nontender, nondistended, without organomegaly.  Muscoloskeletal:  no spinal tenderness of palpation of vertebral spine.  Skin exam was without echymosis, petichae.  Neuro exam was nonfocal.  Patient was able to get on and off exam table without assistance.  Gait was normal.  Patient was alerted and oriented.  Attention was good.   Language was appropriate.  Mood was normal without depression.  Speech was not pressured.  Thought content was not tangential.     LABORATORY/RADIOLOGY DATA:  Lab Results  Component Value Date   WBC 4.7 05/01/2012   HGB 12.4* 05/01/2012   HCT 36.4* 05/01/2012   PLT 170 05/01/2012   GLUCOSE 228* 05/01/2012   CHOL 144 12/04/2011   TRIG 126 12/04/2011   HDL 35* 12/04/2011   LDLCALC 84 12/04/2011   ALKPHOS 96 04/22/2012   ALT 25 04/22/2012   AST 25 04/22/2012   NA 136 05/01/2012   K 4.0 05/01/2012   CL 98 05/01/2012   CREATININE 1.4* 05/01/2012   BUN 18.1 05/01/2012   CO2 28 05/01/2012   PSA 0.58 08/22/2010   INR 1.00 04/10/2012   HGBA1C 7.1* 12/04/2011   MICROALBUR 0.92 12/04/2011     ASSESSMENT AND PLAN:   1.  Hypertension:  On enalapril, metoprolol.  2.  Hyperlipidemia:  On simvastatin. 3.  Diabetes  mellitus, type II:  On saxagliptin/metformin. 4.  History of CAD:  On ASA,  enalapril, metoprolol, simvastatin.  5.  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  -  Treatment: once every 3 week cisplatin chemotherapy and daily radiation -  Mouth sore: mouth rinse salt/baking soda (1 teaspoon each in 1 liter of water) 3x/day after meal.  -  Pain:  I have changed his pain medication from hydrocodone to oxycodone as he was not tolerating the hydrocodone well. -  To prevent esophageal stricture: Please performs swallowing exercise as much as possible during the day at least 3 times a day. - He has Compazine/Zofran/Ativan prn nausea/vomiting.  6.  Psychosocial: The patient's wife is somewhat tearful today. Our social worker has met with the patient and his wife and will continue to follow him closely while on treatment.      The length of time of the face-to-face encounter was 30 minutes. More than 50% of time was spent counseling and coordination of care.

## 2012-05-02 ENCOUNTER — Ambulatory Visit: Payer: Medicare HMO

## 2012-05-02 ENCOUNTER — Telehealth: Payer: Self-pay | Admitting: *Deleted

## 2012-05-02 NOTE — Telephone Encounter (Signed)
Gregory Friedman reports that he is having "intense" burning  in his left face with swelling, in addition to, burning and pain in the right buccal region. Called and spoke with Gregory Friedman in Medical Oncology and she asked Gregory Friedman if there were any additional interventions to decrease the pain that Gregory Friedman is experiencing.  She instructed that  Gregory Friedman Take his Oxycodone solution Q 6 hours, Magic Mouthwash QID, and Baking Soda and Salt solution 3-4 times daily.  He is scheduled to come in at 1:00pm for assessment by Dr. Basilio Cairo on tomorrow.   This information was relayed and stated he would comply.

## 2012-05-03 ENCOUNTER — Other Ambulatory Visit: Payer: Self-pay | Admitting: Radiation Oncology

## 2012-05-03 ENCOUNTER — Ambulatory Visit: Payer: Medicare HMO

## 2012-05-03 ENCOUNTER — Ambulatory Visit
Admission: RE | Admit: 2012-05-03 | Discharge: 2012-05-03 | Disposition: A | Discharge status: Home / Self Care | Payer: Medicare HMO | Source: Ambulatory Visit | Attending: Radiation Oncology | Admitting: Radiation Oncology

## 2012-05-03 ENCOUNTER — Telehealth: Payer: Self-pay | Admitting: Dietician

## 2012-05-03 DIAGNOSIS — C060 Malignant neoplasm of cheek mucosa: Secondary | ICD-10-CM

## 2012-05-03 MED ORDER — FENTANYL 12 MCG/HR TD PT72: 1.0000 | MEDICATED_PATCH | TRANSDERMAL | Status: DC

## 2012-05-03 NOTE — Progress Notes (Addendum)
   Weekly Management Note:  Outpatient Current Dose: 16Gy  Projected Dose: 60Gy   Narrative:  The patient presents for routine under treatment assessment.  CBCT/MVCT images/Port film x-rays were reviewed.  The chart was checked.  He asked to be seen with his wife today. Has cancelled past two  treatments - attributes this to pain in his mouth/throat.  Switched to oxycodone from Hycet due to burning in stomach despite taking pain med with food in stomach.  He and wife cancelled referral to psychologic counseling. He is adamant that he wants to stop RT and chemo. He did see Dr. Ezzard Standing who had no concerns re: pt's healing in mouth  Physical Findings:  Vitals with Age-Percentiles 05/03/2012  Length   Systolic 137  Diastolic 78  Pulse 64  Respiration 20  Weight 81.92 kg  VISIT REPORT    per my nurse, mouth looks better than previous visit. No significant erythema.  Impression:  The patient chooses to stop radiotherapy.  Plan:  Lengthy discussion with patient and wife.  Empathy given, but I firmly stated that stopping therapy significantly compromises chance of cure.  His cancer is high risk. I can escalate pain meds, start pain patches, to help temper side effects. This would not change his mind.  He does not want to continue unless there is a guarantee that it will cure him, he states. Ultimately, I could not change his mind.  I gave him a Rx for Duragesic 12.5 to use 1-2 patches at a time, and he can use oxycodone 5mg  up to Q 3hrs.  Will order a 1 month followup and alert social work and Dr. Gaylyn Rong of his status.   Asked patient to call me if he changes his mind. He knows that long treatment breaks can affect efficacy of RT detrimentally. ________________________________   Lonie Peak, M.D.

## 2012-05-06 ENCOUNTER — Ambulatory Visit: Payer: Medicare HMO

## 2012-05-07 ENCOUNTER — Ambulatory Visit: Payer: Medicare HMO

## 2012-05-08 ENCOUNTER — Other Ambulatory Visit: Payer: Self-pay | Admitting: Family Medicine

## 2012-05-08 ENCOUNTER — Ambulatory Visit: Payer: Medicare HMO

## 2012-05-09 ENCOUNTER — Ambulatory Visit: Payer: Medicare HMO

## 2012-05-09 ENCOUNTER — Ambulatory Visit: Payer: Self-pay | Admitting: Audiology

## 2012-05-10 ENCOUNTER — Ambulatory Visit: Payer: Self-pay | Admitting: Oncology

## 2012-05-10 ENCOUNTER — Ambulatory Visit: Payer: Medicare HMO

## 2012-05-10 ENCOUNTER — Other Ambulatory Visit: Payer: Self-pay | Admitting: Lab

## 2012-05-10 NOTE — Patient Instructions (Signed)
1. Diagnosis: Head and neck cancer. 2. Treatment: Once every 3 weeks cisplatin chemotherapy and daily radiation. 3. Status: Finished one cycle of chemotherapy. The next cycle of chemotherapy is due on Monday, 05/13/2012. 4. Mouth sore: Continue mouth rinses with salt and baking soda 3 times a day after meals. Once pain is more severe, we'll start the medication. 5. To prevent esophageal stricture: Please performs swallowing exercise as much as possible during the day at least 3 times a day. 6. To thin out the thick phlegm: Please perform mouth rinses with diluted hydrogen peroxide (5 cc of hydrogen peroxide plus 20 cc of the water)  7. followup: In about 10 days.

## 2012-05-10 NOTE — Progress Notes (Signed)
No show.  Patient had previously decided to stop to chemoradiation. I sent a letter asking him to  me in clinic to discuss treatment options and to see how we can improve his mucositis.

## 2012-05-13 ENCOUNTER — Telehealth: Payer: Self-pay | Admitting: Oncology

## 2012-05-13 ENCOUNTER — Ambulatory Visit: Payer: Medicare HMO

## 2012-05-13 ENCOUNTER — Ambulatory Visit: Payer: Medicare HMO | Admitting: Nutrition

## 2012-05-13 ENCOUNTER — Telehealth: Payer: Self-pay | Admitting: *Deleted

## 2012-05-13 ENCOUNTER — Encounter: Payer: Self-pay | Admitting: Nutrition

## 2012-05-13 DIAGNOSIS — C060 Malignant neoplasm of cheek mucosa: Secondary | ICD-10-CM

## 2012-05-13 NOTE — Telephone Encounter (Signed)
lmonvm for pt re appt for 4/23.

## 2012-05-13 NOTE — Telephone Encounter (Signed)
S/w pt and wife in lobby.  Pt requests his PEG tube removed.  States not using it and swallowing ok now.  States he does not plan to have Radiation treatments any more,  States could not tolerate them.  He does want to speak w/ Dr. Gaylyn Rong about any other options.   He would like office visit to see Dr. Gaylyn Rong again.   Informed him to expect call from Scheduling for office visit and I.R. About PEG removal.  He verbalized understanding.    Notified dr. Gaylyn Rong of above.  He wants to see pt to discuss Radiation/chemo prior to PEG being removed.  POF sent to schedule office visit and order for PEG removal After office visit.

## 2012-05-13 NOTE — Progress Notes (Signed)
Patient and wife present to nutrition followup. Patient reports that he has stopped all chemotherapy and radiation treatments. He is feeling better than he was during treatment. He states he is eating well. His mouth sores have resolved. Patient reports he is not using his feeding tube for nutrition support however, patient does flush with water as directed. Patient would like to know if he can have his feeding tube removed.  Nutrition diagnosis: Predicted suboptimal energy intake has resolved.  I've educated patient to continue soft foods as tolerated. I have referred him back to his medical oncologist for discussion on feeding tube removal. There is no followup scheduled. Patient has my contact information if he has further questions.

## 2012-05-14 ENCOUNTER — Telehealth: Payer: Self-pay | Admitting: *Deleted

## 2012-05-14 ENCOUNTER — Encounter (HOSPITAL_COMMUNITY): Payer: Self-pay | Admitting: Pharmacy Technician

## 2012-05-14 ENCOUNTER — Ambulatory Visit: Payer: Medicare HMO

## 2012-05-14 NOTE — Telephone Encounter (Signed)
Wife called to confirm pt's lab/ and office visit appts tomorrow.

## 2012-05-15 ENCOUNTER — Other Ambulatory Visit (HOSPITAL_BASED_OUTPATIENT_CLINIC_OR_DEPARTMENT_OTHER): Payer: Medicare HMO | Admitting: Lab

## 2012-05-15 ENCOUNTER — Ambulatory Visit (HOSPITAL_BASED_OUTPATIENT_CLINIC_OR_DEPARTMENT_OTHER): Payer: Medicare HMO | Admitting: Oncology

## 2012-05-15 ENCOUNTER — Telehealth: Payer: Self-pay | Admitting: Oncology

## 2012-05-15 ENCOUNTER — Ambulatory Visit: Payer: Medicare HMO

## 2012-05-15 VITALS — BP 131/77 | HR 62 | Temp 96.9°F | Resp 18 | Ht 68.0 in | Wt 182.4 lb

## 2012-05-15 DIAGNOSIS — C060 Malignant neoplasm of cheek mucosa: Secondary | ICD-10-CM

## 2012-05-15 LAB — CBC WITH DIFFERENTIAL/PLATELET
BASO%: 1.5 % (ref 0.0–2.0)
Eosinophils Absolute: 0.4 10*3/uL (ref 0.0–0.5)
LYMPH%: 23.2 % (ref 14.0–49.0)
MCHC: 33.8 g/dL (ref 32.0–36.0)
MONO#: 0.4 10*3/uL (ref 0.1–0.9)
NEUT#: 1.8 10*3/uL (ref 1.5–6.5)
Platelets: 140 10*3/uL (ref 140–400)
RBC: 4.48 10*6/uL (ref 4.20–5.82)
RDW: 13 % (ref 11.0–14.6)
WBC: 3.3 10*3/uL — ABNORMAL LOW (ref 4.0–10.3)
lymph#: 0.8 10*3/uL — ABNORMAL LOW (ref 0.9–3.3)
nRBC: 0 % (ref 0–0)

## 2012-05-15 LAB — COMPREHENSIVE METABOLIC PANEL (CC13)
ALT: 14 U/L (ref 0–55)
AST: 17 U/L (ref 5–34)
Alkaline Phosphatase: 87 U/L (ref 40–150)
Creatinine: 1.4 mg/dL — ABNORMAL HIGH (ref 0.7–1.3)
Sodium: 140 mEq/L (ref 136–145)
Total Bilirubin: 0.37 mg/dL (ref 0.20–1.20)

## 2012-05-15 NOTE — Progress Notes (Signed)
  Radiation Oncology         [phone]) [phone] ________________________________  Name: Norma Ignasiak MRN: [id number]  Date: 05/01/2012  DOB: 1951/01/13  End of Treatment Note  Diagnosis:   pT1N2b Left Buccal Mucosa SCC  Indication for treatment:  curative       Radiation treatment dates:   04/22/2012-05/01/2012  Site/dose:  Left buccal mucosa and bilateral neck / planned dose, 60 Gy/30 fractions.  Patient refused to continue radiotherapy after 16 Gy in 8 fractions  Beams/energy:   IMRT, helical /6MVphotons  Narrative:  The patient chose to stop radiotherapy and chemotherapy after 16 Gy/8 fractions.  His physical exam was not very impressive but his symptoms were fairly profound in terms of mucosal pain.  I had a lengthy discussion with patient and wife. Empathy given, and I firmly stated that stopping therapy significantly compromises chance of cure. His cancer is high risk. I offered to escalate pain meds, start pain patches, to help temper side effects. This would not change his mind. He did not want to continue unless there is a guarantee that it will cure him, he stated. Ultimately, I could not change his mind. I gave him a Rx for Duragesic 12.5 to use 1-2 patches at a time, and he can use oxycodone 5mg  up to Q 3hrs. Ordered  a 1 month followup and alerted social work and Dr. Gaylyn Rong / Dr. Ezzard Standing of his status. Asked patient to call me if he changes his mind. He knows that long treatment breaks can affect efficacy of RT detrimentally.   -----------------------------------  Lonie Peak, MD

## 2012-05-15 NOTE — Progress Notes (Signed)
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   PAST THERAPY:  Wide excision of left buccal mucosa squamous cell carcinoma with left modified neck dissection.   CURRENT THERAPY:  start adjuvant chemo q3week Cisplatin and daily radiation on 04/22/12. He stopped further radiation therapy after 8 days due to severe mucositis.   INTERVAL HISTORY: Gregory Friedman 62 y.o. male returns for regular follow up with his wife.  He reported that when he was on chemoradiation therapy, he developed severe mucositis, inability to eat, inability to open his mouth widely, severe fatigue. Therefore he had stopped further chemoradiation. Since stopping chemoradiation, these symptoms are improved significantly. He still has some residual abnormal taste. He is able to eat and drink normal food however. He is only flushing his PEG tube but not using it yet.  The rest of the 14 point review system was negative.  Past Medical History  Diagnosis Date  . DM (diabetes mellitus)     type II.   [name] GERD (gastroesophageal reflux disease)   . Hyperlipidemia   . Microscopic hematuria     referred to urology 2012  . Myocardial infarction 2003    with both PCI, and CABG  . BPH (benign prostatic hyperplasia)   . Arthritis   . Anxiety   . Depression   . Hypertension     sees Dr  Clarene Critchley. Nkapp  . Diverticulosis   . Buccal mucosa squamous cell carcinoma     Past Surgical History  Procedure Laterality Date  . Fracture surgery      RT TALAR REPAIR  . Cardiac catheterization  05/2010  . Cardiovascular stress test  07/2009  . Coronary angioplasty  2003; 2006    2 stents in heart  . Coronary artery bypass graft  01/2003    2 vessels  . Cystoscopy  02/09/2011    Procedure: CYSTOSCOPY;  Surgeon: Ky Barban, MD;  Location: [facility];  Service: Urology;  Laterality:  N/A;  . Colonoscopy  01/2011    polyp; next due 2018.   . Radical neck dissection Left 03/13/2012    Procedure: RADICAL NECK DISSECTION;  Surgeon: Drema Halon, MD;  Location: [facility] OR;  Service: ENT;  Laterality: Left;  . Tooth extraction N/A 03/13/2012    Procedure: EXTRACTION MOLARS;  Surgeon: Drema Halon, MD;  Location: [facility] OR;  Service: ENT;  Laterality: N/A;  . Mass excision N/A 03/13/2012    Procedure: EXCISION LEFT BUCCAL MUCOSA;  Surgeon: Drema Halon, MD;  Location: [facility] OR;  Service: ENT;  Laterality: N/A;    Current Outpatient Prescriptions  Medication Sig Dispense Refill  . ALOE VERA PO Take 20 mg by mouth daily.      [name] aspirin 81 MG tablet Take 81 mg by mouth daily.      . enalapril (VASOTEC) 10 MG tablet Take 10 mg by mouth 2 (two) times daily.      [name] Hyaluronic Acid-Vitamin C (HYALURONIC ACID PO) Take 80 mg by mouth daily. Takes 1 tablet daily      . LORazepam (ATIVAN) 0.5 MG tablet Take 0.5 mg by mouth every 6 (six) hours as needed for anxiety (nausea).      [name] omeprazole (PRILOSEC) 40 MG capsule Take 40 mg by mouth daily.       . Saw Palmetto, Serenoa repens, 320  MG CAPS Take 1 capsule by mouth daily.      . Saxagliptin-Metformin 05-998 MG TB24 Take 1 tablet by mouth daily.      . simvastatin (ZOCOR) 40 MG tablet Take 40 mg by mouth at bedtime.      . Vitamins C E (VITAMIN C & E COMPLEX) 500-400 MG-UNIT CAPS Take 1 capsule by mouth daily.      . Alum & Mag Hydroxide-Simeth (MAGIC MOUTHWASH W/LIDOCAINE) SOLN 1part nystatin,1part Maalox plus,1part benadryl,3part 2%viscous lidocaine. Swish/swallow 10mL up to QID, before meals/bedtime  480 mL  5  . metoprolol succinate (TOPROL-XL) 25 MG 24 hr tablet Take 25 mg by mouth at bedtime.      . nitroGLYCERIN (NITROSTAT) 0.4 MG SL tablet Place 1 tablet (0.4 mg total) under the tongue every 5 (five) minutes as needed. Chest pain  25 tablet  1  . ondansetron (ZOFRAN) 8 MG tablet Take 8 mg by mouth every 12 (twelve)  hours as needed for nausea.       [name] OVER THE COUNTER MEDICATION Take 2 tablets by mouth daily.       . prochlorperazine (COMPAZINE) 10 MG tablet Take 10 mg by mouth every 6 (six) hours as needed (nausea/vomiting).        No current facility-administered medications for this visit.    ALLERGIES:  has No Known Allergies.   Filed Vitals:   05/15/12 0927  BP: 131/77  Pulse: 62  Temp: 96.9 F (36.1 C)  Resp: 18   Wt Readings from Last 3 Encounters:  05/15/12 182 lb 6.4 oz (82.736 kg)  05/03/12 180 lb 9.6 oz (81.92 kg)  05/01/12 182 lb 14.4 oz (82.963 kg)   ECOG Performance status: 1  PHYSICAL EXAMINATION:   General:  well-nourished man, in no acute distress.  Eyes:  no scleral icterus.  ENT:  There were no oropharyngeal lesions. There was no left buccal mucosal abnormality. Neck was without thyromegaly.  Left neck dissection has healed.  Lymphatics:  Negative cervical, supraclavicular or axillary adenopathy.  Respiratory: lungs were clear bilaterally without wheezing or crackles.  Cardiovascular:  Regular rate and rhythm, S1/S2, without murmur, rub or gallop.  There was no pedal edema.  GI:  abdomen was soft, flat, nontender, nondistended, without organomegaly.  Muscoloskeletal:  no spinal tenderness of palpation of vertebral spine.  Skin exam was without echymosis, petichae.  Neuro exam was nonfocal.  Patient was able to get on and off exam table without assistance.  Gait was normal.  Patient was alert and oriented.  Attention was good.   Language was appropriate.  Mood was normal without depression.  Speech was not pressured.  Thought content was not tangential.      LABORATORY/RADIOLOGY DATA:  Lab Results  Component Value Date   WBC 3.3* 05/15/2012   HGB 12.4* 05/15/2012   HCT 36.7* 05/15/2012   PLT 140 05/15/2012   GLUCOSE 266* 05/15/2012   CHOL 144 12/04/2011   TRIG 126 12/04/2011   HDL 35* 12/04/2011   LDLCALC 84 12/04/2011   ALKPHOS 87 05/15/2012   ALT 14 05/15/2012   AST 17  05/15/2012   NA 140 05/15/2012   K 3.9 05/15/2012   CL 102 05/15/2012   CREATININE 1.4* 05/15/2012   BUN 13.5 05/15/2012   CO2 28 05/15/2012   PSA 0.58 08/22/2010   INR 1.00 04/10/2012   HGBA1C 7.1* 12/04/2011   MICROALBUR 0.92 12/04/2011     ASSESSMENT AND PLAN:   1.  Hypertension:  On  enalapril, metoprolol.  2.  Hyperlipidemia:  On simvastatin. 3.  Diabetes mellitus, type II:  On saxagliptin/metformin. 4.  History of CAD:  On ASA,  enalapril, metoprolol, simvastatin.  5.  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  I discussed with Mr. Sondra Come and his wife that without further adjuvant chemoradiation therapy, the risk of recurrent cancer is about 30%. He had grade 3 mucositis, grade 3 fatigue, grade 2 abnormal taste. The side effects are expected for chemoradiation. However at this time he does not want to resume chemoradiation. I advised him another possibly would be to just take adjuvant radiation therapy without chemotherapy. However he was adamantly against taking radiation therapy again. I advised him to think this over this weekend. He inquired about clinical trials for adjuvant therapy. Any of this trial would require adjuvant radiation therapy as of care. Chemotherapy alone is not affective in the adjuvant setting for head and neck cancer.  I advised him to avoid smoking cigarettes, chewing tobacco, drinking alcohol to decrease the risk of recurrent cancer.  He expressed informed understanding wished to proceed with watchful observation at this time. He will contact Dr. Basilio Cairo if he changes his mind about radiation therapy next week.  For followup: Will obtain a surveillance CT of the neck in about 6 months with followup the day after CT scan.   I informed the patient and his wife that I am leaving the practice in about 3 months. The cancer Center will send him  information to arrange transfer name oncologist.    The length of time of the face-to-face encounter was 25 minutes. More than 50% of time was spent counseling and coordination of care.     Nayel Purdy T. Gaylyn Rong, M.D.

## 2012-05-16 ENCOUNTER — Ambulatory Visit: Payer: Medicare HMO

## 2012-05-17 ENCOUNTER — Ambulatory Visit: Payer: Medicare HMO

## 2012-05-20 ENCOUNTER — Ambulatory Visit: Payer: Medicare HMO | Admitting: Radiation Oncology

## 2012-05-20 ENCOUNTER — Ambulatory Visit: Payer: Medicare HMO

## 2012-05-21 ENCOUNTER — Ambulatory Visit: Payer: Medicare HMO

## 2012-05-21 ENCOUNTER — Ambulatory Visit (HOSPITAL_COMMUNITY)
Admission: RE | Admit: 2012-05-21 | Discharge: 2012-05-21 | Disposition: A | Discharge status: Home / Self Care | Payer: Medicare HMO | Source: Ambulatory Visit | Attending: Oncology | Admitting: Oncology

## 2012-05-21 DIAGNOSIS — R1310 Dysphagia, unspecified: Secondary | ICD-10-CM | POA: Insufficient documentation

## 2012-05-21 DIAGNOSIS — C060 Malignant neoplasm of cheek mucosa: Secondary | ICD-10-CM | POA: Insufficient documentation

## 2012-05-21 DIAGNOSIS — Z431 Encounter for attention to gastrostomy: Secondary | ICD-10-CM | POA: Insufficient documentation

## 2012-05-21 MED ORDER — SILVER NITRATE-POT NITRATE 75-25 % EX MISC
2.0000 | Freq: Once | CUTANEOUS | Status: DC
  Filled 2012-05-21: qty 2

## 2012-05-21 MED ORDER — DIAZEPAM 5 MG PO TABS
5.0000 mg | ORAL_TABLET | Freq: Once | ORAL | Status: AC
  Administered 2012-05-21: 5 mg via ORAL
  Filled 2012-05-21: qty 1

## 2012-05-21 NOTE — Procedures (Signed)
Successful removal of Gastrostomy tube. No complications.

## 2012-05-22 ENCOUNTER — Ambulatory Visit: Payer: Medicare HMO

## 2012-05-22 ENCOUNTER — Encounter: Payer: Self-pay | Admitting: Oncology

## 2012-05-22 NOTE — Progress Notes (Signed)
Patient's wife left message. I called back and they need EPP. I verified address.

## 2012-05-23 ENCOUNTER — Telehealth: Payer: Self-pay | Admitting: *Deleted

## 2012-05-23 ENCOUNTER — Ambulatory Visit: Payer: Medicare HMO

## 2012-05-23 ENCOUNTER — Encounter: Payer: Self-pay | Admitting: *Deleted

## 2012-05-23 NOTE — Telephone Encounter (Signed)
Gregory Friedman at this time.  Instructions given.  Myriam Jacobson says he stopped the aspirin three days ago.

## 2012-05-23 NOTE — Telephone Encounter (Signed)
Patient's wife Gregory Friedman called asking if it is normal for Gregory Friedman to still have bleeding from area tube was removed.  Tube was removed on 05-21-2012.  It was cauterized so there is a dark area with red center.  The center is soft with a meat like texture.  Denies Gregory Friedman doing any pulling, lifting or other activity to irritate his abdomen.  She is changing dressing once per day wearing gloves, dabbing area with a long sterile q-tip then covering it with new gauze.  The drainage is the size of a dime but long/oval.  Has called IR and was told to call [facility].    This nurse noted patient is diabetic.  Gregory Friedman denies any redness, warmth or yellow pus drainage and Gregory Friedman is afebrile.  Instructed her he will have delayed healing due to being diabetic.  Also reviewed wound assessment for signs of infection informing her with every dressing change to look for these changes and call if noted.  Will notify providers for any further orders.

## 2012-05-23 NOTE — Telephone Encounter (Signed)
Wife called,  States returning call to Triage RN,  Roz, about an issue w/ pt's PEG tube site still bleeding after PEG has been removed.  Called Roz in Triage to return wife's call again on her cell phone [phone].

## 2012-05-23 NOTE — Telephone Encounter (Signed)
Verbal order received and read back from Jerelyn Charles NP for patient to stop aspirin for three days and to not use excessive ibuprofen.  Okay to continue using gauze for dressing changes.  Called Patient's home and cell numbers several times.  Two messages left.  Requested return call and message left with these instructions.

## 2012-05-24 ENCOUNTER — Ambulatory Visit: Payer: Medicare HMO

## 2012-05-27 ENCOUNTER — Ambulatory Visit: Payer: Medicare HMO

## 2012-05-28 ENCOUNTER — Ambulatory Visit: Payer: Medicare HMO

## 2012-05-29 ENCOUNTER — Ambulatory Visit: Payer: Medicare HMO

## 2012-05-30 ENCOUNTER — Ambulatory Visit: Payer: Medicare HMO

## 2012-05-31 ENCOUNTER — Ambulatory Visit: Payer: Medicare HMO | Admitting: Radiation Oncology

## 2012-05-31 ENCOUNTER — Ambulatory Visit: Payer: Medicare HMO

## 2012-06-03 ENCOUNTER — Ambulatory Visit: Payer: Medicare HMO

## 2012-06-03 ENCOUNTER — Encounter: Payer: Self-pay | Admitting: Nutrition

## 2012-06-03 ENCOUNTER — Ambulatory Visit: Payer: Self-pay

## 2012-06-06 ENCOUNTER — Encounter: Payer: Self-pay | Admitting: Oncology

## 2012-06-06 NOTE — Progress Notes (Signed)
25%ind 06/06/12-12/07/12. I will send the letter and card to patient. All documents are scanned in.

## 2012-06-11 ENCOUNTER — Encounter: Payer: Self-pay | Admitting: Oncology

## 2012-06-11 NOTE — Progress Notes (Signed)
Called Myriam Jacobson the patient's wife back. She said she called billing and they advised her they would not go back and take discount 25%. I advised her to check again and get monthly pmts set up.

## 2012-06-11 NOTE — Progress Notes (Signed)
I did advise her of 400.00 one time grant he could use also. She really needed help on old bills.

## 2012-06-26 ENCOUNTER — Encounter: Payer: Self-pay | Admitting: Oncology

## 2012-06-26 ENCOUNTER — Encounter: Payer: Self-pay | Admitting: Family Medicine

## 2012-06-26 ENCOUNTER — Ambulatory Visit (INDEPENDENT_AMBULATORY_CARE_PROVIDER_SITE_OTHER): Payer: Medicare HMO | Admitting: Family Medicine

## 2012-06-26 VITALS — BP 142/84 | HR 72 | Ht 68.0 in | Wt 180.0 lb

## 2012-06-26 DIAGNOSIS — E119 Type 2 diabetes mellitus without complications: Secondary | ICD-10-CM

## 2012-06-26 DIAGNOSIS — I2510 Atherosclerotic heart disease of native coronary artery without angina pectoris: Secondary | ICD-10-CM

## 2012-06-26 DIAGNOSIS — E785 Hyperlipidemia, unspecified: Secondary | ICD-10-CM

## 2012-06-26 DIAGNOSIS — I10 Essential (primary) hypertension: Secondary | ICD-10-CM

## 2012-06-26 DIAGNOSIS — C060 Malignant neoplasm of cheek mucosa: Secondary | ICD-10-CM

## 2012-06-26 DIAGNOSIS — R1032 Left lower quadrant pain: Secondary | ICD-10-CM

## 2012-06-26 NOTE — Progress Notes (Signed)
Chief Complaint  Patient presents with  . Diabetes    nonfasting med check.   Started having some left lower quadrant pain yesterday.  Pain is intermittent, lasting about 1-2 minutes, then resolving.  Occurred a few times yesterday, twice today.   Pain is sharp.  Moving bowels regularly.  Denies blood in stool or change in bowels.  No nausea or vomiting. Denies dysuria or urinary complaints, normal flow.  No change in activities (no bending/lifting/twisting).  Recently started eating more vegetables. Feels much different than prior diverticulitis.   Diabetes:  Checks sugars every other day, running 114-130, fasting.  Not checking later in the day.  While on radiation treatments his sugars were running higher, 160's.  At one point he was on a more liquid diet, when had sores in his mouth.  He is back to a more regular diet, only for about 2-3 weeks. Last eye exam was <1 year ago, October.  Decrease in taste, smell, some hair loss, all finally improving over the last 2-3 weeks.  Still having some dry mouth.  Past Medical History  Diagnosis Date  . DM (diabetes mellitus)     type II.   [name] GERD (gastroesophageal reflux disease)   . Hyperlipidemia   . Microscopic hematuria     referred to urology 2012  . Myocardial infarction 2003    with both PCI, and CABG  . BPH (benign prostatic hyperplasia)   . Arthritis   . Anxiety   . Depression   . Hypertension     sees Dr  Clarene Critchley. Nkapp  . Diverticulosis   . Buccal mucosa squamous cell carcinoma   . S/P radiation therapy     Received 8 fractions, then stopped due to mucositis with Radiation/ Cisplatin   Past Surgical History  Procedure Laterality Date  . Fracture surgery      RT TALAR REPAIR  . Cardiac catheterization  05/2010  . Cardiovascular stress test  07/2009  . Coronary angioplasty  2003; 2006    2 stents in heart  . Coronary artery bypass graft  01/2003    2 vessels  . Cystoscopy  02/09/2011    Procedure: CYSTOSCOPY;  Surgeon: Ky Barban, MD;  Location: [facility];  Service: Urology;  Laterality: N/A;  . Colonoscopy  01/2011    polyp; next due 2018.   . Radical neck dissection Left 03/13/2012    Procedure: RADICAL NECK DISSECTION;  Surgeon: Drema Halon, MD;  Location: [facility] OR;  Service: ENT;  Laterality: Left;  . Tooth extraction N/A 03/13/2012    Procedure: EXTRACTION MOLARS;  Surgeon: Drema Halon, MD;  Location: [facility] OR;  Service: ENT;  Laterality: N/A;  . Mass excision N/A 03/13/2012    Procedure: EXCISION LEFT BUCCAL MUCOSA;  Surgeon: Drema Halon, MD;  Location: [facility] OR;  Service: ENT;  Laterality: N/A;   History   Social History  . Marital Status: Married    Spouse Name: N/A    Number of Children: 3  . Years of Education: N/A   Occupational History  . 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 Smoking status: Never Smoker   . Smokeless tobacco: Never Used  . Alcohol Use: No  . Drug Use: No  . Sexually Active: Not on file   Other Topics Concern  . Not on file   Social History Narrative  . No narrative on file   Current Outpatient Prescriptions on File Prior  to Visit  Medication Sig Dispense Refill  . aspirin 81 MG tablet Take 81 mg by mouth daily.      . enalapril (VASOTEC) 10 MG tablet Take 10 mg by mouth 2 (two) times daily.      [name] Hyaluronic Acid-Vitamin C (HYALURONIC ACID PO) Take 80 mg by mouth daily. Takes 1 tablet daily      . metoprolol succinate (TOPROL-XL) 25 MG 24 hr tablet Take 25 mg by mouth at bedtime.      [name] omeprazole (PRILOSEC) 40 MG capsule Take 40 mg by mouth daily.       [name] OVER THE COUNTER MEDICATION Take 2 tablets by mouth daily.       . Saw Palmetto, Serenoa repens, 320 MG CAPS Take 1 capsule by mouth daily.      . Saxagliptin-Metformin 05-998 MG TB24 Take 1 tablet by mouth daily.      . simvastatin (ZOCOR) 40 MG tablet Take 40 mg by mouth at bedtime.      . Vitamins C E (VITAMIN C & E COMPLEX) 500-400 MG-UNIT CAPS Take 1 capsule by mouth  daily.      . nitroGLYCERIN (NITROSTAT) 0.4 MG SL tablet Place 1 tablet (0.4 mg total) under the tongue every 5 (five) minutes as needed. Chest pain  25 tablet  1   No current facility-administered medications on file prior to visit.   No Known Allergies  PHYSICAL EXAM: BP 142/84  Pulse 72  Ht 5\' 8"  (1.727 m)  Wt 180 lb (81.647 kg)  BMI 27.38 kg/m2 Pleasant male in good spirits, in no distress HEENT:  PERRL, EOMI, conjunctiva clear. OP:  Surgical scar L cheek at buccal mucosa.  Asymmetry to smile Neck: no lymphadenopathy, thyromegaly or mass.  WHSS L neck Heart: regular rate and rhythm without murmur Lungs: clear bilaterally Back: no spine or CVA tenderness Abdomen:  Soft, minimal tenderness to LLQ, no mass, rebound tenderness or guarding.  Active bowel sounds. Extremities: no edema.  Normal diabetic foot exam, normal monofilament sensation Skin: no bleeding, bruising, rashes Psych: normal mood, affect, hygiene and grooming  Lab Results  Component Value Date   HGBA1C 7.5 06/26/2012   ASSESSMENT/PLAN:  Type II or unspecified type diabetes mellitus without mention of complication, not stated as uncontrolled - Plan: HgB A1c, Hemoglobin A1c, HM Diabetes Foot Exam  CAD  HYPERTENSION, BENIGN  HYPERLIPIDEMIA-MIXED - Plan: Lipid panel  Buccal mucosa squamous cell carcinoma  LLQ abdominal pain  LLQ intermittent, short-lived sharp pain.  Reviewed Ddx.  Trial of gas-x.  Pay attention to what makes pain better, worse  DM--A1c above goal, but am sugars are okay.  Periodically check evening sugar. Likely A1c is higher due to dietary changes that occurred related to treatments for oral cancer.  Sugars are now improved.  Continue same medication.  Recheck A1c in 3 months Along with lipid  If A1c remains >7, then change kombiglyze to maximize the metformin  Reminded to schedule yearly eye exams

## 2012-06-26 NOTE — Patient Instructions (Signed)
Pay attention to what makes pain better, worse.  Drink plenty of fluids.  Try simethicone as needed for pain (Gas-X).    Periodically check your sugar in the evening (either 2 hours after meal, or at bedtime), 1x/week.  Write down, and bring list of sugars to your next appointment.  No changes in medication today--work on getting back to a normal diet, low in sugar and cholesterol.  Recheck labs in 3 months.

## 2012-06-26 NOTE — Progress Notes (Signed)
The patient's wife Myriam Jacobson had left a message about the 400.00 [facility] fund. I called back and spoke with him and her,but I explained all to her. 200.00 for meds and the other 200.00 for bills. I would need copy of bill 3-4 in advance for payment and he would have to come in and sign the form. They will discuss and call me back.

## 2012-07-05 ENCOUNTER — Ambulatory Visit: Payer: Medicare HMO | Admitting: Radiation Oncology

## 2012-07-10 ENCOUNTER — Encounter: Payer: Self-pay | Admitting: Oncology

## 2012-07-10 NOTE — Progress Notes (Signed)
Patient and wife came in to sign  [facility] fund 400.00 form. They will bring some bills back for me to pay for them. I advised 200.00 is for meds, but other 200.00 can pay bills.

## 2012-07-11 ENCOUNTER — Other Ambulatory Visit: Payer: Self-pay | Admitting: Family Medicine

## 2012-07-15 ENCOUNTER — Telehealth: Payer: Self-pay | Admitting: Gastroenterology

## 2012-07-15 NOTE — Telephone Encounter (Signed)
Pt states he is having problems with bloating and abdominal pain. Also c/o nausea and difficulty swallowing. Pt requests to be seen. Pt scheduled to see Dr. Arlyce Dice 07/17/12@9 :30am. Pt aware of appt date and time.

## 2012-07-17 ENCOUNTER — Ambulatory Visit: Payer: Self-pay | Admitting: Gastroenterology

## 2012-07-30 ENCOUNTER — Ambulatory Visit: Payer: Self-pay | Admitting: Physician Assistant

## 2012-08-01 ENCOUNTER — Ambulatory Visit: Payer: Self-pay | Admitting: Physician Assistant

## 2012-08-23 ENCOUNTER — Ambulatory Visit: Payer: Self-pay | Admitting: Gastroenterology

## 2012-08-24 ENCOUNTER — Other Ambulatory Visit: Payer: Self-pay | Admitting: Family Medicine

## 2012-09-16 ENCOUNTER — Other Ambulatory Visit: Payer: Self-pay | Admitting: Otolaryngology

## 2012-09-16 DIAGNOSIS — C060 Malignant neoplasm of cheek mucosa: Secondary | ICD-10-CM

## 2012-09-18 ENCOUNTER — Encounter: Payer: Self-pay | Admitting: Oncology

## 2012-09-18 NOTE — Progress Notes (Signed)
Wife Myriam Jacobson called and left a message. I called her back and she was checking on discount and that she was set up on pmt plan, but was told a date of service(within 05/2012-11/2012) we could not take the 25%. I told her going forward no discount, but we have to honor what they have now. She will call back and make sure the 25% was taken.

## 2012-09-24 ENCOUNTER — Other Ambulatory Visit: Payer: Self-pay | Admitting: Family Medicine

## 2012-09-24 ENCOUNTER — Other Ambulatory Visit: Payer: Medicare HMO

## 2012-09-24 ENCOUNTER — Telehealth: Payer: Self-pay | Admitting: Internal Medicine

## 2012-09-24 DIAGNOSIS — E785 Hyperlipidemia, unspecified: Secondary | ICD-10-CM

## 2012-09-24 DIAGNOSIS — E119 Type 2 diabetes mellitus without complications: Secondary | ICD-10-CM

## 2012-09-24 LAB — LIPID PANEL
Cholesterol: 142 mg/dL (ref 0–200)
Total CHOL/HDL Ratio: 3.6 Ratio
Triglycerides: 194 mg/dL — ABNORMAL HIGH (ref <150)
VLDL: 39 mg/dL (ref 0–40)

## 2012-09-24 LAB — HEMOGLOBIN A1C: Mean Plasma Glucose: 163 mg/dL — ABNORMAL HIGH (ref <117)

## 2012-09-24 MED ORDER — SAXAGLIPTIN-METFORMIN ER 5-1000 MG PO TB24: 1.0000 | ORAL_TABLET | Freq: Every day | ORAL | Status: DC

## 2012-09-24 NOTE — Telephone Encounter (Signed)
Pt wanted to know if there was samples of komglyizide 05-998. i am able to give him samples for a 14 day supply

## 2012-09-26 ENCOUNTER — Encounter: Payer: Self-pay | Admitting: Family Medicine

## 2012-09-26 ENCOUNTER — Ambulatory Visit (INDEPENDENT_AMBULATORY_CARE_PROVIDER_SITE_OTHER): Payer: Medicare HMO | Admitting: Family Medicine

## 2012-09-26 VITALS — BP 140/88 | HR 68 | Ht 68.0 in | Wt 179.0 lb

## 2012-09-26 DIAGNOSIS — I10 Essential (primary) hypertension: Secondary | ICD-10-CM

## 2012-09-26 DIAGNOSIS — C060 Malignant neoplasm of cheek mucosa: Secondary | ICD-10-CM

## 2012-09-26 DIAGNOSIS — IMO0001 Reserved for inherently not codable concepts without codable children: Secondary | ICD-10-CM

## 2012-09-26 DIAGNOSIS — E785 Hyperlipidemia, unspecified: Secondary | ICD-10-CM

## 2012-09-26 LAB — MAGNESIUM: Magnesium: 1.5 mg/dL (ref 1.5–2.5)

## 2012-09-26 MED ORDER — SAXAGLIPTIN-METFORMIN ER 2.5-1000 MG PO TB24: 2.0000 | ORAL_TABLET | Freq: Every day | ORAL | Status: DC

## 2012-09-26 NOTE — Progress Notes (Signed)
Chief Complaint  Patient presents with  . Diabetes    nonfasting med check, labs done 09/24/12. Would like to have magnesium level added on to last blood work, just curious. Also ringing in his left ear has gotten worse since his surgery.    DM follow-up:  Sugars are running  99-120 in the mornings; 170's-80's 2 hours after meals.  He is eating a lot of rice, white. Taste is back, appetite is much improved.  Denies any jaw/mouth pain; he has some ongoing numbness in his cheek.  Has a ?lump in the cheek, and is scheduled for scan and f/u next week.  He describes a firm band, which limits his ability to open his mouth.  He also is having some worsening ringing in his left ear.  Denies polydipsia or polyuria, checks feet regularly and denies lesions, numbness or concerns.  Hyperlipidemia follow-up:  Patient is reportedly following a low-fat, low cholesterol diet.  Compliant with medications and denies medication side effects  Hypertension follow-up:  Blood pressures are not checked elsewhere.  Denies dizziness, headaches, chest pain.  Denies side effects of medications.  Requesting Mg level to be checked (add on to recent labs) because he is curious--takes PPI chronically.  Starting to "almost" have leg cramps again--overall much better since on the hyaluronic acid.  Past Medical History  Diagnosis Date  . DM (diabetes mellitus)     type II.   [name] GERD (gastroesophageal reflux disease)   . Hyperlipidemia   . Microscopic hematuria     referred to urology 2012  . Myocardial infarction 2003    with both PCI, and CABG  . BPH (benign prostatic hyperplasia)   . Arthritis   . Anxiety   . Depression   . Hypertension     sees Dr  Clarene Critchley. Nkapp  . Diverticulosis   . Buccal mucosa squamous cell carcinoma   . S/P radiation therapy     Received 8 fractions, then stopped due to mucositis with Radiation/ Cisplatin   Past Surgical History  Procedure Laterality Date  . Fracture surgery      RT TALAR REPAIR   . Cardiac catheterization  05/2010  . Cardiovascular stress test  07/2009  . Coronary angioplasty  2003; 2006    2 stents in heart  . Coronary artery bypass graft  01/2003    2 vessels  . Cystoscopy  02/09/2011    Procedure: CYSTOSCOPY;  Surgeon: Ky Barban, MD;  Location: [facility];  Service: Urology;  Laterality: N/A;  . Colonoscopy  01/2011    polyp; next due 2018.   . Radical neck dissection Left 03/13/2012    Procedure: RADICAL NECK DISSECTION;  Surgeon: Drema Halon, MD;  Location: [facility] OR;  Service: ENT;  Laterality: Left;  . Tooth extraction N/A 03/13/2012    Procedure: EXTRACTION MOLARS;  Surgeon: Drema Halon, MD;  Location: [facility] OR;  Service: ENT;  Laterality: N/A;  . Mass excision N/A 03/13/2012    Procedure: EXCISION LEFT BUCCAL MUCOSA;  Surgeon: Drema Halon, MD;  Location: [facility] OR;  Service: ENT;  Laterality: N/A;   History   Social History  . Marital Status: Married    Spouse Name: N/A    Number of Children: 3  . Years of Education: N/A   Occupational History  . 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 Smoking status: Never Smoker   . Smokeless tobacco: Never Used  . Alcohol Use:  No  . Drug Use: No  . Sexual Activity: Not on file   Other Topics Concern  . Not on file   Social History Narrative  . No narrative on file   Current outpatient prescriptions:aspirin 81 MG tablet, Take 81 mg by mouth daily., Disp: , Rfl: ;  enalapril (VASOTEC) 10 MG tablet, TAKE 1 TABLET EVERY DAY, Disp: 90 tablet, Rfl: 0;  Hyaluronic Acid-Vitamin C (HYALURONIC ACID PO), Take 80 mg by mouth daily. Takes 1 tablet daily, Disp: , Rfl: ;  metoprolol succinate (TOPROL-XL) 25 MG 24 hr tablet, Take 25 mg by mouth at bedtime., Disp: , Rfl:  omeprazole (PRILOSEC) 40 MG capsule, Take 40 mg by mouth daily. , Disp: , Rfl: ;  OVER THE COUNTER MEDICATION, Take 2 tablets by mouth daily. , Disp: , Rfl: ;  Saw Palmetto, Serenoa repens, 320 MG CAPS, Take 1  capsule by mouth daily., Disp: , Rfl: ;  simvastatin (ZOCOR) 40 MG tablet, Take 40 mg by mouth at bedtime., Disp: , Rfl: ;  Vitamins C E (VITAMIN C & E COMPLEX) 500-400 MG-UNIT CAPS, Take 1 capsule by mouth daily., Disp: , Rfl:  nitroGLYCERIN (NITROSTAT) 0.4 MG SL tablet, Place 1 tablet (0.4 mg total) under the tongue every 5 (five) minutes as needed. Chest pain, Disp: 25 tablet, Rfl: 1;  Saxagliptin-Metformin 2.05-998 MG TB24, Take 2 tablets by mouth daily., Disp: 24 tablet, Rfl: 0 (dose of Kombiglyze PRIOR to visit was 5/1000mg  once daily; changed to 2.05/998 2 tabs daily at visit)  No Known Allergies  ROS:  Denies fevers, URI symptoms, cough, shortness of breath, chest pain. Nausea, vomiting, heartburn, bowel changes, urinary complaints, bleeding, bruising, rashes, numbness or tingling (except over left cheek s/p surgery).  Denies depression, anxiety.  Denies headaches, dizziness or other concerns.  PHYSICAL EXAM: BP 140/88  Pulse 68  Ht 5\' 8"  (1.727 m)  Wt 179 lb (81.194 kg)  BMI 27.22 kg/m2 144/84 on repeat by MD Well developed, well-appearing male, accompanied by wife, in good spirits, no distress HEENT:  PERRL, EOMI, conjunctiva clear.  TM's and EAC's normal.  R buccal mucosa--firm band of tissue, nontender.  Unchanged per pt--has upcoming appt. OP otherwise clear Neck: no lymphadenopathy, thyromegaly or carotid bruit Heart: regular rate and rhythm Lungs: clear bilaterally Abdomen: soft, nontender Extremities: no edema, 2+ pulses. normal sensation, no lesions. Neuro: alert and oriented, normal gait, strength, sensation Psych: normal mood, affect, hygiene, grooming, speech and eye contact  Lab Results  Component Value Date   HGBA1C 7.3* 09/24/2012    Lab Results  Component Value Date   CHOL 142 09/24/2012   HDL 39* 09/24/2012   LDLCALC 64 09/24/2012   TRIG 194* 09/24/2012   CHOLHDL 3.6 09/24/2012   ASSESSMENT/PLAN:  Type II or unspecified type diabetes mellitus without mention of  complication, uncontrolled - suboptimally controlled.  reviewed diet in detail (cut back on portions of carbs, change to whole grain/brown rice). maximize metformin in kombiglyze - Plan: Saxagliptin-Metformin 2.05-998 MG TB24, DISCONTINUED: Saxagliptin-Metformin 2.05-998 MG TB24  HYPERTENSION, BENIGN - elevated today.  reviewed goals.  check regularly, keep log.  low sodium diet.  adjust meds if persistently elevated  HYPERLIPIDEMIA-MIXED - LDL at goal.  TG high.  diet reviewed in detail.  continue current meds  Buccal mucosa squamous cell carcinoma - f/u with ENT next week as planned  F/u with ENT as scheduled for tinnitus and f/u on jaw  BP elevated--reviewed low sodium diet.  Restart regular exercise routine.  Check  BP's regularly at home.  Return sooner than 3 month appt if BP's are running >135-140/85-90.  Bring list of blood pressures and your monitor to next visit.  F/u 3 months, A1c at visit (non fasting, no labs prior).

## 2012-09-26 NOTE — Patient Instructions (Signed)
Your blood pressure was elevated today.   low sodium diet.  Restart regular exercise routine.  Check BP's regularly at home.  Return sooner than 3 month appt if BP's are running >135-140/85-90.  Bring list of blood pressures and your monitor to next visit.  Cut back on your portions of rice.  Change to brown rice rather than white.  Exercise at least 30 minutes daily (in 10-15 minute intervals if you can't do it all at once).  We are increasing your kombiglyze--changing you from 05/998 once daily, 2.05/998 TWO tablets every day.  You can take these together, but if it upsets your stomach, you can split it and take it twice daily with meals.  Okay to finish out your current supply of the 05/998 at once daily.

## 2012-09-30 ENCOUNTER — Ambulatory Visit
Admission: RE | Admit: 2012-09-30 | Discharge: 2012-09-30 | Disposition: A | Discharge status: Home / Self Care | Payer: Medicare HMO | Source: Ambulatory Visit | Attending: Otolaryngology | Admitting: Otolaryngology

## 2012-09-30 DIAGNOSIS — C060 Malignant neoplasm of cheek mucosa: Secondary | ICD-10-CM

## 2012-09-30 MED ORDER — IOHEXOL 300 MG/ML  SOLN
75.0000 mL | Freq: Once | INTRAMUSCULAR | Status: AC | PRN
  Administered 2012-09-30: 75 mL via INTRAVENOUS

## 2012-10-07 ENCOUNTER — Ambulatory Visit: Payer: Self-pay | Admitting: Internal Medicine

## 2012-10-08 ENCOUNTER — Telehealth: Payer: Self-pay | Admitting: Family Medicine

## 2012-10-09 ENCOUNTER — Telehealth: Payer: Self-pay | Admitting: Family Medicine

## 2012-10-09 NOTE — Telephone Encounter (Signed)
lm

## 2012-10-10 ENCOUNTER — Encounter: Payer: Self-pay | Admitting: Family Medicine

## 2012-10-29 ENCOUNTER — Other Ambulatory Visit: Payer: Self-pay | Admitting: Family Medicine

## 2012-11-12 ENCOUNTER — Telehealth: Payer: Self-pay | Admitting: Hematology and Oncology

## 2012-11-12 NOTE — Telephone Encounter (Signed)
Moved 10/24 appt to 10/27 w/NG. S/w wife she is aware. Also confirmed 10/23 lb/ct.

## 2012-11-13 ENCOUNTER — Other Ambulatory Visit: Payer: Self-pay | Admitting: Hematology and Oncology

## 2012-11-13 DIAGNOSIS — C060 Malignant neoplasm of cheek mucosa: Secondary | ICD-10-CM

## 2012-11-14 ENCOUNTER — Ambulatory Visit (HOSPITAL_COMMUNITY)
Admission: RE | Admit: 2012-11-14 | Discharge: 2012-11-14 | Disposition: A | Discharge status: Home / Self Care | Payer: Medicare HMO | Source: Ambulatory Visit | Attending: Oncology | Admitting: Oncology

## 2012-11-14 ENCOUNTER — Other Ambulatory Visit (HOSPITAL_BASED_OUTPATIENT_CLINIC_OR_DEPARTMENT_OTHER): Payer: Medicare HMO | Admitting: Lab

## 2012-11-14 DIAGNOSIS — C060 Malignant neoplasm of cheek mucosa: Secondary | ICD-10-CM

## 2012-11-14 LAB — CBC WITH DIFFERENTIAL/PLATELET
BASO%: 2.1 % — ABNORMAL HIGH (ref 0.0–2.0)
EOS%: 5.9 % (ref 0.0–7.0)
MCH: 27.4 pg (ref 27.2–33.4)
MCHC: 33.6 g/dL (ref 32.0–36.0)
MONO#: 0.4 10*3/uL (ref 0.1–0.9)
RBC: 5.03 10*6/uL (ref 4.20–5.82)
RDW: 13.7 % (ref 11.0–14.6)
WBC: 4.9 10*3/uL (ref 4.0–10.3)
lymph#: 1.2 10*3/uL (ref 0.9–3.3)

## 2012-11-14 LAB — COMPREHENSIVE METABOLIC PANEL (CC13)
ALT: 17 U/L (ref 0–55)
AST: 20 U/L (ref 5–34)
Anion Gap: 12 mEq/L — ABNORMAL HIGH (ref 3–11)
CO2: 26 mEq/L (ref 22–29)
Calcium: 10 mg/dL (ref 8.4–10.4)
Chloride: 101 mEq/L (ref 98–109)
Sodium: 139 mEq/L (ref 136–145)
Total Protein: 8.1 g/dL (ref 6.4–8.3)

## 2012-11-15 ENCOUNTER — Ambulatory Visit: Payer: Self-pay | Admitting: Oncology

## 2012-11-18 ENCOUNTER — Ambulatory Visit (HOSPITAL_BASED_OUTPATIENT_CLINIC_OR_DEPARTMENT_OTHER): Payer: Medicare HMO | Admitting: Hematology and Oncology

## 2012-11-18 ENCOUNTER — Telehealth: Payer: Self-pay | Admitting: Hematology and Oncology

## 2012-11-18 VITALS — BP 152/82 | HR 72 | Temp 97.0°F | Resp 18 | Ht 68.0 in | Wt 181.2 lb

## 2012-11-18 DIAGNOSIS — C060 Malignant neoplasm of cheek mucosa: Secondary | ICD-10-CM

## 2012-11-18 NOTE — Progress Notes (Signed)
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 According to the patient, the cancer was discovered when he complained of a persistent soreness in the left buccal mucosa. The patient had wide local excision followed by modified left neck dissection. He was recommended adjuvant chemoradiation therapy in March 2014 but unable to tolerate chemotherapy due to the severe mucositis. Treatment was terminated early and he was placed on observation  INTERVAL HISTORY: Gregory Friedman 62 y.o. male returns for further followup. He still has complaints of inability to open his mouth widely. Denies any fatigue. No persistent soreness in his mouth. He started to gain back some weight that he has lost. He just saw Dr. Ezzard Standing recently with complete examination and imaging study. He denies any problem with swallowing. Denies any palpable lymphadenopathy. Denies any smoking or drinking alcohol. He denies any recent fever, chills, night sweats or abnormal weight loss  I have reviewed the past medical history, past surgical history, social history and family history with the patient and they are unchanged from previous note.  ALLERGIES:  has No Known Allergies.  MEDICATIONS:  Current Outpatient Prescriptions  Medication Sig Dispense Refill  . aspirin 81 MG tablet Take 81 mg by mouth daily.      . enalapril (VASOTEC) 10 MG tablet TAKE 1 TABLET EVERY DAY  90 tablet  0  . Hyaluronic Acid-Vitamin C (HYALURONIC ACID PO) Take 80 mg by  mouth daily. Takes 1 tablet daily      . metoprolol succinate (TOPROL-XL) 25 MG 24 hr tablet Take 25 mg by mouth at bedtime.      . nitroGLYCERIN (NITROSTAT) 0.4 MG SL tablet Place 1 tablet (0.4 mg total) under the tongue every 5 (five) minutes as needed. Chest pain  25 tablet  1  . omeprazole (PRILOSEC) 40 MG capsule Take 40 mg by mouth daily.       [name] OVER THE COUNTER MEDICATION Take 2 tablets by mouth daily.       . Saw Palmetto, Serenoa repens, 320 MG CAPS Take 1 capsule by mouth daily.      . Saxagliptin-Metformin 2.05-998 MG TB24 Take 2 tablets by mouth daily.  24 tablet  0  . simvastatin (ZOCOR) 40 MG tablet TAKE 1 TABLET AT BEDTIME  90 tablet  3  . Vitamins C E (VITAMIN C & E COMPLEX) 500-400 MG-UNIT CAPS Take 1 capsule by mouth daily.       No current facility-administered medications for this visit.    REVIEW OF SYSTEMS:   Constitutional: Denies fevers, chills or abnormal weight loss Eyes: Denies blurriness of vision Ears, nose, mouth, throat, and face: Denies mucositis or sore throat Respiratory: Denies cough, dyspnea or wheezes Cardiovascular: Denies palpitation, chest discomfort or lower extremity swelling Gastrointestinal:  Denies nausea, heartburn or change in bowel habits Skin: Denies abnormal skin rashes Lymphatics: Denies new lymphadenopathy or easy bruising Neurological:Denies numbness, tingling or new weaknesses Behavioral/Psych: Mood is stable, no new changes  All other systems were reviewed with the patient and are negative.  PHYSICAL EXAMINATION: ECOG PERFORMANCE STATUS: 0 - Asymptomatic  Filed Vitals:  11/18/12 1041  BP: 152/82  Pulse: 72  Temp: 97 F (36.1 C)  Resp: 18   Filed Weights   11/18/12 1041  Weight: 181 lb 3.2 oz (82.192 kg)    GENERAL:alert, no distress and comfortable SKIN: skin color, texture, turgor are normal, no rashes or significant lesions EYES: normal, Conjunctiva are pink and non-injected, sclera clear OROPHARYNX:no exudate, no  erythema and lips, buccal mucosa, and tongue normal . No thrush. No abnormalities at the excision site NECK: supple, thyroid normal size, non-tender, without nodularity LYMPH:  no palpable lymphadenopathy in the cervical, axillary or inguinal LUNGS: clear to auscultation and percussion with normal breathing effort HEART: regular rate & rhythm and no murmurs and no lower extremity edema ABDOMEN:abdomen soft, non-tender and normal bowel sounds Musculoskeletal:no cyanosis of digits and no clubbing  NEURO: alert & oriented x 3 with fluent speech, no focal motor/sensory deficits  LABORATORY DATA:  I have reviewed the data as listed    Component Value Date/Time   NA 139 11/14/2012 0919   NA 137 04/10/2012 0740   K 3.7 11/14/2012 0919   K 3.6 04/10/2012 0740   CL 102 05/15/2012 0917   CL 99 04/10/2012 0740   CO2 26 11/14/2012 0919   CO2 25 04/10/2012 0740   GLUCOSE 169* 11/14/2012 0919   GLUCOSE 266* 05/15/2012 0917   GLUCOSE 130* 04/10/2012 0740   BUN 14.7 11/14/2012 0919   BUN 14 04/10/2012 0740   CREATININE 1.3 11/14/2012 0919   CREATININE 1.11 04/10/2012 0740   CREATININE 1.23 12/04/2011 0921   CALCIUM 10.0 11/14/2012 0919   CALCIUM 9.5 04/10/2012 0740   PROT 8.1 11/14/2012 0919   PROT 8.1 03/04/2012 1455   ALBUMIN 4.1 11/14/2012 0919   ALBUMIN 4.9 03/04/2012 1455   AST 20 11/14/2012 0919   AST 24 03/04/2012 1455   ALT 17 11/14/2012 0919   ALT 28 03/04/2012 1455   ALKPHOS 69 11/14/2012 0919   ALKPHOS 59 03/04/2012 1455   BILITOT 0.52 11/14/2012 0919   BILITOT 0.5 03/04/2012 1455   GFRNONAA 69* 04/10/2012 0740   GFRAA 80* 04/10/2012 0740    No results found for this basename: SPEP, UPEP,  kappa and lambda light chains    Lab Results  Component Value Date   WBC 4.9 11/14/2012   NEUTROABS 2.9 11/14/2012   HGB 13.8 11/14/2012   HCT 41.1 11/14/2012   MCV 81.7 11/14/2012   PLT 166 11/14/2012      Chemistry      Component Value Date/Time   NA 139 11/14/2012 0919   NA 137 04/10/2012  0740   K 3.7 11/14/2012 0919   K 3.6 04/10/2012 0740   CL 102 05/15/2012 0917   CL 99 04/10/2012 0740   CO2 26 11/14/2012 0919   CO2 25 04/10/2012 0740   BUN 14.7 11/14/2012 0919   BUN 14 04/10/2012 0740   CREATININE 1.3 11/14/2012 0919   CREATININE 1.11 04/10/2012 0740   CREATININE 1.23 12/04/2011 0921      Component Value Date/Time   CALCIUM 10.0 11/14/2012 0919   CALCIUM 9.5 04/10/2012 0740   ALKPHOS 69 11/14/2012 0919   ALKPHOS 59 03/04/2012 1455   AST 20 11/14/2012 0919   AST 24 03/04/2012 1455   ALT 17 11/14/2012 0919   ALT 28 03/04/2012 1455   BILITOT 0.52 11/14/2012 0919   BILITOT 0.5 03/04/2012 1455       RADIOGRAPHIC STUDIES: I have personally reviewed the radiological images as listed and agreed with the  findings in the report. I reviewed his most recent CT scan of the neck which show no evidence of recurrence of disease  ASSESSMENT:  Squamous cell carcinoma of the left buccal mucosa, no evidence of recurrence  PLAN:  #1 squamous cell carcinoma of the left buccal mucosa Is no evidence of disease recurrence. I educated the patient and family about signs and symptoms to watch out for disease recurrence. He'll continue followup with ENT as directed. I'll see him back with history, physical examination, in imaging study with CT scan of the neck to rule out disease recurrence. #2 difficulties opening his mouth This is related to his surgery. Overall he is able to maintain his weight. #3 preventive care I recommend influenza vaccination. The patient will get it from his local store.  Orders Placed This Encounter  Procedures  . CT Soft Tissue Neck Wo Contrast    Standing Status: Future     Number of Occurrences:      Standing Expiration Date: 11/18/2013    Order Specific Question:  Reason for Exam (SYMPTOM  OR DIAGNOSIS REQUIRED)    Answer:  buccal mucosal ca, r/o recurrence    Order Specific Question:  Preferred imaging location?    Answer:  [facility]  .  CBC with Differential    Standing Status: Future     Number of Occurrences:      Standing Expiration Date: 08/10/2013  . Comprehensive metabolic panel    Standing Status: Future     Number of Occurrences:      Standing Expiration Date: 11/18/2013   All questions were answered. The patient knows to call the clinic with any problems, questions or concerns. No barriers to learning was detected. I spent 25 minutes counseling the patient face to face. The total time spent in the appointment was 40 minutes and more than 50% was on counseling and review of test results     [facility], Kassy Mcenroe, MD 11/18/2012 11:00 AM

## 2012-11-18 NOTE — Telephone Encounter (Signed)
sw pt adv of 04/15 appts mailed calendar shh

## 2012-11-25 ENCOUNTER — Other Ambulatory Visit (INDEPENDENT_AMBULATORY_CARE_PROVIDER_SITE_OTHER): Payer: Medicare HMO

## 2012-11-25 DIAGNOSIS — Z23 Encounter for immunization: Secondary | ICD-10-CM

## 2012-12-16 ENCOUNTER — Telehealth: Payer: Self-pay | Admitting: Internal Medicine

## 2012-12-16 DIAGNOSIS — IMO0001 Reserved for inherently not codable concepts without codable children: Secondary | ICD-10-CM

## 2012-12-16 MED ORDER — SAXAGLIPTIN-METFORMIN ER 2.5-1000 MG PO TB24: 2.0000 | ORAL_TABLET | Freq: Every day | ORAL | Status: DC

## 2012-12-16 NOTE — Telephone Encounter (Signed)
Pt is wanting samples of kombiglyze XR 2.05/998   Gave pt 36 tablets

## 2013-01-06 ENCOUNTER — Ambulatory Visit (INDEPENDENT_AMBULATORY_CARE_PROVIDER_SITE_OTHER): Payer: Medicare HMO | Admitting: Family Medicine

## 2013-01-06 ENCOUNTER — Encounter: Payer: Self-pay | Admitting: Family Medicine

## 2013-01-06 ENCOUNTER — Other Ambulatory Visit: Payer: Self-pay | Admitting: *Deleted

## 2013-01-06 VITALS — BP 112/72 | HR 72 | Ht 68.0 in | Wt 180.0 lb

## 2013-01-06 DIAGNOSIS — E119 Type 2 diabetes mellitus without complications: Secondary | ICD-10-CM

## 2013-01-06 DIAGNOSIS — I10 Essential (primary) hypertension: Secondary | ICD-10-CM

## 2013-01-06 DIAGNOSIS — Z125 Encounter for screening for malignant neoplasm of prostate: Secondary | ICD-10-CM

## 2013-01-06 DIAGNOSIS — E785 Hyperlipidemia, unspecified: Secondary | ICD-10-CM

## 2013-01-06 LAB — POCT GLYCOSYLATED HEMOGLOBIN (HGB A1C): Hemoglobin A1C: 6.3

## 2013-01-06 MED ORDER — SAXAGLIPTIN-METFORMIN ER 2.5-1000 MG PO TB24: 2.0000 | ORAL_TABLET | Freq: Every day | ORAL | Status: DC

## 2013-01-06 NOTE — Patient Instructions (Signed)
Continue your current medications. Continue to check your sugars periodically (once daily, vary the times). Continue to monitor your blood pressures.  Return sooner if >140/90 consistently. Try and exercise daily.

## 2013-01-06 NOTE — Progress Notes (Signed)
Chief Complaint  Patient presents with  . Diabetes    nonfasting med check. Glucsoe montior is over 63 years old, wants to know if you think he is due for a new one.    Patient presents for follow-up on his diabetes.  3 months ago his A1c was 7.3.  His kombiglyze dose was increased to 2 tablets daily.  He is tolerating this without significant side effects.  Blood sugars are <100 fasting, and <150 after meals.  Occasionally feels slightly sweaty in the middle of the night, but sugar is normal, in the 90's.  Denies any hypoglycemia.    He is trying to eat earlier in the evenings, cut back on rice in his diet, drinking more water (lemon water), and low sodium diet.  Hypertension follow-up:  Blood pressures elsewhere are 130/80.  Denies dizziness, headaches, chest pain.  Denies side effects of medications.  His blood pressure was high at his last visit, but medications were not changed.  We just talked about low sodium diet and regular exercise.  He is trying to walk more in the neighborhood.  He saw the oncologist and Dr. Ezzard Standing recently--doing well without any recurrence.  Past Medical History  Diagnosis Date  . DM (diabetes mellitus)     type II.   [name] GERD (gastroesophageal reflux disease)   . Hyperlipidemia   . Microscopic hematuria     referred to urology 2012  . Myocardial infarction 2003    with both PCI, and CABG  . BPH (benign prostatic hyperplasia)   . Arthritis   . Anxiety   . Depression   . Hypertension     sees Dr  Clarene Critchley. Nkapp  . Diverticulosis   . Buccal mucosa squamous cell carcinoma   . S/P radiation therapy     Received 8 fractions, then stopped due to mucositis with Radiation/ Cisplatin   Past Surgical History  Procedure Laterality Date  . Fracture surgery      RT TALAR REPAIR  . Cardiac catheterization  05/2010  . Cardiovascular stress test  07/2009  . Coronary angioplasty  2003; 2006    2 stents in heart  . Coronary artery bypass graft  01/2003    2 vessels  .  Cystoscopy  02/09/2011    Procedure: CYSTOSCOPY;  Surgeon: Ky Barban, MD;  Location: [facility];  Service: Urology;  Laterality: N/A;  . Colonoscopy  01/2011    polyp; next due 2018.   . Radical neck dissection Left 03/13/2012    Procedure: RADICAL NECK DISSECTION;  Surgeon: Drema Halon, MD;  Location: [facility] OR;  Service: ENT;  Laterality: Left;  . Tooth extraction N/A 03/13/2012    Procedure: EXTRACTION MOLARS;  Surgeon: Drema Halon, MD;  Location: [facility] OR;  Service: ENT;  Laterality: N/A;  . Mass excision N/A 03/13/2012    Procedure: EXCISION LEFT BUCCAL MUCOSA;  Surgeon: Drema Halon, MD;  Location: [facility] OR;  Service: ENT;  Laterality: N/A;   History   Social History  . Marital Status: Married    Spouse Name: N/A    Number of Children: 3  . Years of Education: N/A   Occupational History  . 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 Smoking status: Never Smoker   . Smokeless tobacco: Never Used  . Alcohol Use: No  . Drug Use: No  . Sexual Activity: Not on file   Other Topics Concern  . Not on file  Social History Narrative  . No narrative on file   Patient's medications were reviewed--it looks like he should need refill on enalapril, but he states he just got some (?not reflected in computer).  He will call when refills are needed.  Current outpatient prescriptions:aspirin 81 MG tablet, Take 81 mg by mouth daily., Disp: , Rfl: ;  enalapril (VASOTEC) 10 MG tablet, TAKE 1 TABLET EVERY DAY, Disp: 90 tablet, Rfl: 0;  Hyaluronic Acid-Vitamin C (HYALURONIC ACID PO), Take 80 mg by mouth daily. Takes 1 tablet daily, Disp: , Rfl: ;  metoprolol succinate (TOPROL-XL) 25 MG 24 hr tablet, Take 25 mg by mouth at bedtime., Disp: , Rfl:  omeprazole (PRILOSEC) 40 MG capsule, Take 40 mg by mouth daily. , Disp: , Rfl: ;  OVER THE COUNTER MEDICATION, Take 2 tablets by mouth daily. , Disp: , Rfl: ;  Saw Palmetto, Serenoa repens, 320 MG CAPS, Take 1 capsule by  mouth daily., Disp: , Rfl: ;  Saxagliptin-Metformin 2.05-998 MG TB24, Take 2 tablets by mouth daily., Disp: 36 tablet, Rfl: 0;  simvastatin (ZOCOR) 40 MG tablet, TAKE 1 TABLET AT BEDTIME, Disp: 90 tablet, Rfl: 3 Vitamins C E (VITAMIN C & E COMPLEX) 500-400 MG-UNIT CAPS, Take 1 capsule by mouth daily., Disp: , Rfl: ;  nitroGLYCERIN (NITROSTAT) 0.4 MG SL tablet, Place 1 tablet (0.4 mg total) under the tongue every 5 (five) minutes as needed. Chest pain, Disp: 25 tablet, Rfl: 1  No Known Allergies  ROS:  Denies fevers, chills, URI symptoms, ear pain, sore throat, chest pain, palpitations, irregular heartbeat, dizziness, headaches, heartburn, GI complaints, bleeding, bruising, feet swelling or other concerns.  No longer having problems with insomnia, sleeping better.  PHYSICAL EXAM: BP 112/72  Pulse 72  Ht 5\' 8"  (1.727 m)  Wt 180 lb (81.647 kg)  BMI 27.38 kg/m2  144/84 on repeat by MD Neck: no lymphadenopathy, thyromegaly or mass Heart: regular rate and rhythm without murmur Lungs: clear bilaterally Extremities: no edema Skin: no rash Psych: normal mood, affect, hygiene and grooming  ASSESSMENT/PLAN:  Type II or unspecified type diabetes mellitus without mention of complication, not stated as uncontrolled - improved control.  continue current medications - Plan: HgB A1c, Hemoglobin A1c, Comprehensive metabolic panel, TSH  HYPERTENSION, BENIGN - elevated upon recheck by MD, normal for nurse.  numbers okay at home--continue to monitor BP at home, and continue current meds. - Plan: Comprehensive metabolic panel  Special screening for malignant neoplasm of prostate - Plan: PSA, Medicare  HYPERLIPIDEMIA-MIXED - Plan: Lipid panel   F/u 3 months with fasting labs prior Due for CPE

## 2013-01-15 ENCOUNTER — Telehealth: Payer: Self-pay | Admitting: *Deleted

## 2013-01-15 NOTE — Telephone Encounter (Signed)
Called patient and gave him the names of the 3 glucose meters we have in our office that we can give him. He will check with his insurance and see which strips will be covered for which machine(s). He will call back and let me know and I will give him that machine and call in his test strips and lancets to [facility] for him at that time.

## 2013-01-15 NOTE — Telephone Encounter (Signed)
Patient called and wanted to know if he could have a new glucometer, possibly from our supply. His current meter is 63 years old. If not, can we please send an rx to [facility] for him.

## 2013-01-15 NOTE — Telephone Encounter (Signed)
I'm fine with giving him glucometer, but he should check with insurance to see if they will cover the strips for the machines we have

## 2013-01-20 ENCOUNTER — Telehealth: Payer: Self-pay | Admitting: Family Medicine

## 2013-01-20 DIAGNOSIS — E119 Type 2 diabetes mellitus without complications: Secondary | ICD-10-CM

## 2013-01-20 MED ORDER — ACCU-CHEK NANO SMARTVIEW W/DEVICE KIT: PACK | Status: DC

## 2013-01-20 MED ORDER — GLUCOSE BLOOD VI STRP: ORAL_STRIP | Status: DC

## 2013-01-20 MED ORDER — LANCETS MISC: Status: DC

## 2013-01-20 NOTE — Telephone Encounter (Signed)
His pharmacy faxed over for a request for new meter,test strips and lancets. Sent it in new meter, strips and lancets

## 2013-01-20 NOTE — Telephone Encounter (Signed)
sent to pharmacy

## 2013-02-26 ENCOUNTER — Other Ambulatory Visit: Payer: Self-pay | Admitting: *Deleted

## 2013-02-26 ENCOUNTER — Telehealth: Payer: Self-pay | Admitting: Family Medicine

## 2013-02-26 DIAGNOSIS — E1165 Type 2 diabetes mellitus with hyperglycemia: Principal | ICD-10-CM

## 2013-02-26 DIAGNOSIS — IMO0001 Reserved for inherently not codable concepts without codable children: Secondary | ICD-10-CM

## 2013-02-26 MED ORDER — SAXAGLIPTIN-METFORMIN ER 2.5-1000 MG PO TB24: 2.0000 | ORAL_TABLET | Freq: Every day | ORAL | Status: DC

## 2013-02-26 NOTE — Telephone Encounter (Signed)
Samples given and pt notified.

## 2013-02-27 NOTE — Telephone Encounter (Signed)
Done

## 2013-03-03 ENCOUNTER — Other Ambulatory Visit: Payer: Self-pay | Admitting: Family Medicine

## 2013-03-25 ENCOUNTER — Telehealth: Payer: Self-pay | Admitting: Family Medicine

## 2013-03-25 NOTE — Telephone Encounter (Signed)
Called pt. Made appt for Thur 3/5

## 2013-03-25 NOTE — Telephone Encounter (Signed)
He will need OV first.  I believe he is asking for a PT referral.  He has not had this problem recently, so needs OV first and referral will be done at visit. Last seen for this in 2012

## 2013-03-27 ENCOUNTER — Ambulatory Visit (INDEPENDENT_AMBULATORY_CARE_PROVIDER_SITE_OTHER): Payer: Medicare HMO | Admitting: Family Medicine

## 2013-03-27 ENCOUNTER — Encounter: Payer: Self-pay | Admitting: Family Medicine

## 2013-03-27 VITALS — BP 140/80 | HR 60 | Ht 68.0 in | Wt 183.0 lb

## 2013-03-27 DIAGNOSIS — J309 Allergic rhinitis, unspecified: Secondary | ICD-10-CM

## 2013-03-27 DIAGNOSIS — F325 Major depressive disorder, single episode, in full remission: Secondary | ICD-10-CM | POA: Insufficient documentation

## 2013-03-27 DIAGNOSIS — H8110 Benign paroxysmal vertigo, unspecified ear: Secondary | ICD-10-CM

## 2013-03-27 MED ORDER — MECLIZINE HCL 25 MG PO TABS: 12.5000 mg | ORAL_TABLET | Freq: Three times a day (TID) | ORAL | Status: DC | PRN

## 2013-03-27 NOTE — Progress Notes (Signed)
Chief Complaint  Patient presents with  . Dizziness    ringing in ears started a few weeks ago. Dizziness and nausea started a few days later. Has been doing exercises (brought in on paper) from vestibular clinic from last time.  Also has been sweating at night and in the mornings-checks his sugar and it is in the low 90's. Would like to know if zyrtec would something he can take for his itchy skin-worried about his prostate?   Every morning he is having runny nose, itchy nose.  Mucus is clear.  Denies ear pain, plugging, pressure.  He has known decreased hearing per testing, unchanged x 10 years.  He has had tinnitus, which has been worse in the last few weeks, more on left than right ear.  Has some posterior neck pain/upper back pain.  Tried sleeping with an extra pillow, but that didn't help.    Recurrent vertigo about 1.5 weeks ago.   Vertigo seems to occur with any sudden movement, not one particular position.  He admits that this feels different than what he had in the past, which was more positional in nature.   He hasn't used any OTC meds for vertigo.  He has used an OTC ear drop for ear ringing.  He restarted the exercises he was given from PT back in 2012 when he had BPV.  Other than posterior neck/head pain, he denies any headaches.  Hurts to touch only.  Denies numbness, tingling, weakness, memory problems.  Had itchy, dry scalp, thought it was from taking too hot showers.  He started washing his hair with colder water via the bath, leaning his head forward to wash his hair in the bath.  Neck pain started since he made this change.  Past Medical History  Diagnosis Date  . DM (diabetes mellitus)     type II.   [name] GERD (gastroesophageal reflux disease)   . Hyperlipidemia   . Microscopic hematuria     referred to urology 2012  . Myocardial infarction 2003    with both PCI, and CABG  . BPH (benign prostatic hyperplasia)   . Arthritis   . Anxiety   . Depression   . Hypertension    sees Dr  Tera Helper. Khristian Phillippi  . Diverticulosis   . Buccal mucosa squamous cell carcinoma   . S/P radiation therapy     Received 8 fractions, then stopped due to mucositis with Radiation/ Cisplatin  . BPV (benign positional vertigo) 2012    improved after vestibular rehab   Past Surgical History  Procedure Laterality Date  . Fracture surgery      RT TALAR REPAIR  . Cardiac catheterization  05/2010  . Cardiovascular stress test  07/2009  . Coronary angioplasty  2003; 2006    2 stents in heart  . Coronary artery bypass graft  01/2003    2 vessels  . Cystoscopy  02/09/2011    Procedure: CYSTOSCOPY;  Surgeon: Marissa Nestle, MD;  Location: [facility];  Service: Urology;  Laterality: N/A;  . Colonoscopy  01/2011    polyp; next due 2018.   . Radical neck dissection Left 03/13/2012    Procedure: RADICAL NECK DISSECTION;  Surgeon: Rozetta Nunnery, MD;  Location: [place];  Service: ENT;  Laterality: Left;  . Tooth extraction N/A 03/13/2012    Procedure: EXTRACTION MOLARS;  Surgeon: Rozetta Nunnery, MD;  Location: [place];  Service: ENT;  Laterality: N/A;  . Mass excision N/A 03/13/2012    Procedure: EXCISION  LEFT BUCCAL MUCOSA;  Surgeon: Rozetta Nunnery, MD;  Location: [place];  Service: ENT;  Laterality: N/A;   History   Social History  . Marital Status: Married    Spouse Name: N/A    Number of Children: 3  . Years of Education: N/A   Occupational History  . 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 Smoking status: Never Smoker   . Smokeless tobacco: Never Used  . Alcohol Use: No  . Drug Use: No  . Sexual Activity: Not on file   Other Topics Concern  . Not on file   Social History Narrative  . No narrative on file   Current Outpatient Prescriptions on File Prior to Visit  Medication Sig Dispense Refill  . aspirin 81 MG tablet Take 81 mg by mouth daily.      . Blood Glucose Monitoring Suppl (ACCU-CHEK NANO SMARTVIEW) W/DEVICE KIT Check your blood sugars once  daily  1 kit  0  . enalapril (VASOTEC) 10 MG tablet TAKE 1 TABLET EVERY DAY  90 tablet  0  . glucose blood test strip Use as instructed  100 each  prn  . Hyaluronic Acid-Vitamin C (HYALURONIC ACID PO) Take 80 mg by mouth daily. Takes 1 tablet daily      . Lancets MISC Please test blood sugars daily  100 each  prn  . metoprolol succinate (TOPROL-XL) 25 MG 24 hr tablet Take 25 mg by mouth at bedtime.      [name] omeprazole (PRILOSEC) 40 MG capsule Take 40 mg by mouth daily.       . Saw Palmetto, Serenoa repens, 320 MG CAPS Take 1 capsule by mouth daily.      . Saxagliptin-Metformin 2.05-998 MG TB24 Take 2 tablets by mouth daily.  48 tablet  0  . simvastatin (ZOCOR) 40 MG tablet TAKE 1 TABLET AT BEDTIME  90 tablet  3  . Vitamins C E (VITAMIN C & E COMPLEX) 500-400 MG-UNIT CAPS Take 1 capsule by mouth daily.      . nitroGLYCERIN (NITROSTAT) 0.4 MG SL tablet Place 1 tablet (0.4 mg total) under the tongue every 5 (five) minutes as needed. Chest pain  25 tablet  1  . OVER THE COUNTER MEDICATION Take 2 tablets by mouth daily.        No current facility-administered medications on file prior to visit.   No Known Allergies  ROS:  Denies fevers, chills, nausea, vomiting, diarrhea.  +posterior headaches. No numbness, tingling  +tinnitus and vertigo.  Denies cough, shortness of breath.  +allergies.  No sore throat, ear pain, bleeding, bruising, rashes or other complaints.  PHYSICAL EXAM: BP 140/80  Pulse 60  Ht 5' 8" (1.727 m)  Wt 183 lb (83.008 kg)  BMI 27.83 kg/m2 Pleasant male in no distress HEENT:  PERRL, EOMI, conjunctiva clear.  Nasal mucosa mildly edematous, pale, L>R.  No erythema or purulence.  Sinuses nontender.  OP clear Neck: no lymphadenopathy, thyromeglay or mass.  WHSS Heart: regular rate and rhythm Lungs: clear bilaterally Neuro: alert and oriented.  Cranial nerves intact (some facial asymmetry related to surgery for cancer; no weakness).  Normal strength, sensation.  DTR's 2+ and  symmetric.  Normal gait.  Normal finger-to-nose. Some vertigo with head turned to the right.  Symptoms were most severe when lying with head turned to the right.  Symptoms were very short-lived.   Milder symptoms upon sitting up with head turned to the  right.  No vertigo with lying/sitting with head turned to the left or straight forward  ASSESSMENT/PLAN:  BPV (benign positional vertigo) - seems to have positional component (despite his hx); may have some contribution by allergies.  meclizine prn.  refer to PT if not improving - Plan: meclizine (MEDI-MECLIZINE) 25 MG tablet  Allergic rhinitis, cause unspecified - restart Flonase  BPV, mild.  May have some allergic component as well. Trial of meclizine.  If not improving, will refer back to vestibular rehab/PT  Restart Flonase  F/u as scheduled

## 2013-03-27 NOTE — Patient Instructions (Signed)
Take the meclizine to help with the vertigo.  It might cause a little drowsiness, so use with caution.  If symptoms aren't improving over the next week, we can then refer you to rehab.  Stop leaning forward to wash your hair, as this is putting strain on your neck muscles.  You can use heat and massage to help with the neck pain.  It looks like you might have slight allergies flaring.  Restart using the Flonase, which will help with the allergies, without causing any effect on your prostate

## 2013-04-04 ENCOUNTER — Other Ambulatory Visit: Payer: Medicare HMO

## 2013-04-04 DIAGNOSIS — E785 Hyperlipidemia, unspecified: Secondary | ICD-10-CM

## 2013-04-04 DIAGNOSIS — I10 Essential (primary) hypertension: Secondary | ICD-10-CM

## 2013-04-04 DIAGNOSIS — Z125 Encounter for screening for malignant neoplasm of prostate: Secondary | ICD-10-CM

## 2013-04-04 DIAGNOSIS — E119 Type 2 diabetes mellitus without complications: Secondary | ICD-10-CM

## 2013-04-04 LAB — COMPREHENSIVE METABOLIC PANEL
ALT: 31 U/L (ref 0–53)
AST: 27 U/L (ref 0–37)
Albumin: 4.4 g/dL (ref 3.5–5.2)
Alkaline Phosphatase: 64 U/L (ref 39–117)
BILIRUBIN TOTAL: 0.4 mg/dL (ref 0.2–1.2)
BUN: 15 mg/dL (ref 6–23)
CALCIUM: 10.3 mg/dL (ref 8.4–10.5)
CHLORIDE: 99 meq/L (ref 96–112)
CO2: 29 meq/L (ref 19–32)
CREATININE: 1.21 mg/dL (ref 0.50–1.35)
GLUCOSE: 93 mg/dL (ref 70–99)
Potassium: 4.2 mEq/L (ref 3.5–5.3)
Sodium: 139 mEq/L (ref 135–145)
Total Protein: 7.3 g/dL (ref 6.0–8.3)

## 2013-04-04 LAB — HEMOGLOBIN A1C
Hgb A1c MFr Bld: 7 % — ABNORMAL HIGH (ref <5.7)
Mean Plasma Glucose: 154 mg/dL — ABNORMAL HIGH (ref <117)

## 2013-04-04 LAB — LIPID PANEL
CHOLESTEROL: 220 mg/dL — AB (ref 0–200)
HDL: 36 mg/dL — AB (ref >39)
LDL Cholesterol: 116 mg/dL — ABNORMAL HIGH (ref 0–99)
TRIGLYCERIDES: 339 mg/dL — AB (ref <150)
Total CHOL/HDL Ratio: 6.1 Ratio
VLDL: 68 mg/dL — ABNORMAL HIGH (ref 0–40)

## 2013-04-05 LAB — PSA, MEDICARE: PSA: 1.08 ng/mL (ref <=4.00)

## 2013-04-05 LAB — TSH: TSH: 3.834 u[IU]/mL (ref 0.350–4.500)

## 2013-04-07 ENCOUNTER — Encounter: Payer: Self-pay | Admitting: Family Medicine

## 2013-04-07 ENCOUNTER — Ambulatory Visit (INDEPENDENT_AMBULATORY_CARE_PROVIDER_SITE_OTHER): Payer: Medicare HMO | Admitting: Family Medicine

## 2013-04-07 VITALS — BP 132/78 | HR 72 | Ht 68.0 in | Wt 184.0 lb

## 2013-04-07 DIAGNOSIS — E785 Hyperlipidemia, unspecified: Secondary | ICD-10-CM

## 2013-04-07 DIAGNOSIS — E119 Type 2 diabetes mellitus without complications: Secondary | ICD-10-CM

## 2013-04-07 DIAGNOSIS — I10 Essential (primary) hypertension: Secondary | ICD-10-CM

## 2013-04-07 DIAGNOSIS — I2510 Atherosclerotic heart disease of native coronary artery without angina pectoris: Secondary | ICD-10-CM

## 2013-04-07 NOTE — Progress Notes (Signed)
Chief Complaint  Patient presents with  . Diabetes    nonfasting med check. Labs already done.    Vertigo resolved.  Meclizine is helpful, doesn't it need it frequently. He is able to avoid a lot of the vertigo by looking to the right when getting up.  Hyperlipidemia follow-up:  Patient is reportedly following a low-fat, low cholesterol diet.  Compliant with medications and denies medication side effects.  Upon review of results, which are significantly higher than last check 6 months ago, he believes that it is possible that he is taking only 10m of simvastatin rather than 40 (taking his wife's since running out of last bottle? Per chart--was given 1 year of refills in October after great lab results in September).  [place].  Patient presents for follow-up on his diabetes. His kombiglyze dose was increased to 2 tablets daily--he is taking this BID rather than once daily, and has no side effects.  Blood sugars are 90-110 fasting, and 120-160 after meals. Occasionally feels slightly sweaty in the middle of the night, but sugar is normal, in the 90's. Denies any hypoglycemia.   Last eye exam was 2 years ago, aware that he needs to schedule.  Hypertension follow-up: Blood pressures elsewhere are 130/80, sometimes up to 140. Denies dizziness, headaches, chest pain. Denies side effects of medications. He is trying to limit salt in his diet.  He is trying to walk more in the neighborhood--15 minutes three times/week  Allergies are improved.  He has been using flonase, but not every day.  Past Medical History  Diagnosis Date  . DM (diabetes mellitus)     type II.   .Marland KitchenGERD (gastroesophageal reflux disease)   . Hyperlipidemia   . Microscopic hematuria     referred to urology 2012  . Myocardial infarction 2003    with both PCI, and CABG  . BPH (benign prostatic hyperplasia)   . Arthritis   . Anxiety   . Depression   .  Hypertension     sees Dr  ETera Helper Henretta Quist  . Diverticulosis   . Buccal mucosa squamous cell carcinoma   . S/P radiation therapy     Received 8 fractions, then stopped due to mucositis with Radiation/ Cisplatin  . BPV (benign positional vertigo) 2012    improved after vestibular rehab   Past Surgical History  Procedure Laterality Date  . Fracture surgery      RT TALAR REPAIR  . Cardiac catheterization  05/2010  . Cardiovascular stress test  07/2009  . Coronary angioplasty  2003; 2006    2 stents in heart  . Coronary artery bypass graft  01/2003    2 vessels  . Cystoscopy  02/09/2011    Procedure: CYSTOSCOPY;  Surgeon: MMarissa Nestle MD;  Location: [facility];  Service: Urology;  Laterality: N/A;  . Colonoscopy  01/2011    polyp; next due 2018.   . Radical neck dissection Left 03/13/2012    Procedure: RADICAL NECK DISSECTION;  Surgeon: CRozetta Nunnery MD;  [place]  Service: ENT;  Laterality: Left;  . Tooth extraction N/A 03/13/2012    Procedure: EXTRACTION MOLARS;  Surgeon: CRozetta Nunnery MD;  [place]  Service: ENT;  Laterality: N/A;  . Mass excision N/A 03/13/2012    Procedure: EXCISION LEFT BUCCAL MUCOSA;  Surgeon: CRozetta Nunnery MD;  [place]  Service: ENT;  Laterality: N/A;   History  Social History  . Marital Status: Married    Spouse Name: N/A    Number of Children: 3  . Years of Education: N/A   Occupational History  . 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 Smoking status: Never Smoker   . Smokeless tobacco: Never Used  . Alcohol Use: No  . Drug Use: No  . Sexual Activity: Not on file   Other Topics Concern  . Not on file   Social History Narrative  . No narrative on file   Outpatient Encounter Prescriptions as of 04/07/2013  Medication Sig Note  . aspirin 81 MG tablet Take 81 mg by mouth daily.   . Blood Glucose Monitoring Suppl (ACCU-CHEK NANO SMARTVIEW) W/DEVICE KIT Check your blood sugars once  daily   . enalapril (VASOTEC) 10 MG tablet TAKE 1 TABLET EVERY DAY   . fluticasone (FLONASE) 50 MCG/ACT nasal spray Place 2 sprays into both nostrils daily. 04/07/2013: Takes prn, not daily  . glucose blood test strip Use as instructed   . Hyaluronic Acid-Vitamin C (HYALURONIC ACID PO) Take 80 mg by mouth daily. Takes 1 tablet daily   . Lancets MISC Please test blood sugars daily   . metoprolol succinate (TOPROL-XL) 25 MG 24 hr tablet Take 25 mg by mouth at bedtime. 04/08/2012: -  . omeprazole (PRILOSEC) 40 MG capsule Take 40 mg by mouth daily.    [name] OVER THE COUNTER MEDICATION Take 2 tablets by mouth daily.  03/04/2012: Brand name_URINOZINC, for prostate.   . Saw Palmetto, Serenoa repens, 320 MG CAPS Take 1 capsule by mouth daily.   . Saxagliptin-Metformin 2.05-998 MG TB24 Take 2 tablets by mouth daily. 04/07/2013: Takes BID  . simvastatin (ZOCOR) 40 MG tablet TAKE 1 TABLET AT BEDTIME 04/07/2013: His wife takes 17m--he isn't sure if perhaps he could be taking hers instead  . Vitamins C E (VITAMIN C & E COMPLEX) 500-400 MG-UNIT CAPS Take 1 capsule by mouth daily.   . meclizine (MEDI-MECLIZINE) 25 MG tablet Take 0.5-1 tablets (12.5-25 mg total) by mouth 3 (three) times daily as needed for dizziness. 04/07/2013: Takes as needed for vertigo (rare)  . nitroGLYCERIN (NITROSTAT) 0.4 MG SL tablet Place 1 tablet (0.4 mg total) under the tongue every 5 (five) minutes as needed. Chest pain 04/07/2013: Doesn't need/not taking  . ondansetron (ZOFRAN) 8 MG tablet Take 8 mg by mouth as needed.  04/07/2013: Takes prn nausea   No Known Allergies  ROS:  Denies fevers, chills, nausea, vomiting, bowel changes, headaches, lightheadedness/syncope.  Vertigo as per HPI, improved.  Denies urinary complaints, bleeding, bruising, rashes, myalgias, depression, chest pain, shortness of breath.  Denies numbness, tingling. See HPI.    PHYSICAL EXAM: BP 132/78  Pulse 72  Ht 5' 8"  (1.727 m)  Wt 184 lb (83.462 kg)  BMI 27.98  kg/m2 Pleasant male in no distress HEENT:  PERRL, EOMI, conjunctiva clear.  OP clear, no lesions.  TM's and EAC's normal Neck: no lymphadenopathy, thyromegaly or carotid bruit Heart: regular rate and rhythm without murmur Lungs: clear bilaterally Abdomen: soft, nontender, no organomegaly or mass Back: no CVA or spinal tenderness Extremities: no edema, 2+ pulse.  Normal sensation.  Normal diabetic foot exam Psych: normal mood, affect, hygiene and grooming Neuro: alert and oriented. Normal strength, gait   Lab Results  Component Value Date   HGBA1C 7.0* 04/04/2013     Chemistry      Component Value Date/Time   NA 139  04/04/2013 1030   NA 139 11/14/2012 0919   K 4.2 04/04/2013 1030   K 3.7 11/14/2012 0919   CL 99 04/04/2013 1030   CL 102 05/15/2012 0917   CO2 29 04/04/2013 1030   CO2 26 11/14/2012 0919   BUN 15 04/04/2013 1030   BUN 14.7 11/14/2012 0919   CREATININE 1.21 04/04/2013 1030   CREATININE 1.3 11/14/2012 0919   CREATININE 1.11 04/10/2012 0740      Component Value Date/Time   CALCIUM 10.3 04/04/2013 1030   CALCIUM 10.0 11/14/2012 0919   ALKPHOS 64 04/04/2013 1030   ALKPHOS 69 11/14/2012 0919   AST 27 04/04/2013 1030   AST 20 11/14/2012 0919   ALT 31 04/04/2013 1030   ALT 17 11/14/2012 0919   BILITOT 0.4 04/04/2013 1030   BILITOT 0.52 11/14/2012 0919     Glucose 93  Lab Results  Component Value Date   CHOL 220* 04/04/2013   HDL 36* 04/04/2013   LDLCALC 116* 04/04/2013   TRIG 339* 04/04/2013   CHOLHDL 6.1 04/04/2013   Lab Results  Component Value Date   TSH 3.834 04/04/2013   Lab Results  Component Value Date   PSA 1.08 04/04/2013   PSA 0.58 08/22/2010   ASSESSMENT/PLAN:  HYPERLIPIDEMIA-MIXED - under worse control; ?taking wrong dose of med? pt to check at home and let us know.  Goals and diet reviewed  HYPERTENSION, BENIGN - controlled.  continue low sodium diet, daily exercise encouraged  CAD - stable  Type II or unspecified type diabetes mellitus without  mention of complication, not stated as uncontrolled - borderline control. continue current meds; diet, exercise reviewed. - Plan: [facility] Diabetes Foot Exam   Reminded to schedule diabetic eye exam--he will need referral due to insurance, but can't recall name of eye doctor during visit. He will call with the name for referral.  Pt to call and let us know what dose of simvastatin he has been taking.  If taking his wife's 46m in error, then will increase back to 472m and plan to recheck in 3-6 mos. If truly taking 4014mthen will need to change to lipitor 69m51mnd recheck in 2-3 months.

## 2013-04-07 NOTE — Patient Instructions (Signed)
Please schedule diabetic eye exam--call us with the name of your doctor so we can do the referral.  Check the dose of your simvastatin. It should be 40mg  daily.  If you have only been taking 20mg  (your wife's?), then you need to make sure to get back on 40mg .  If you have been taking 40mg  the whole time, then we need to switch you to a stronger medication.  Please call us and let us know.

## 2013-04-14 ENCOUNTER — Encounter: Payer: Self-pay | Admitting: *Deleted

## 2013-05-07 ENCOUNTER — Encounter: Payer: Self-pay | Admitting: Family Medicine

## 2013-05-07 ENCOUNTER — Ambulatory Visit (INDEPENDENT_AMBULATORY_CARE_PROVIDER_SITE_OTHER): Payer: Commercial Managed Care - HMO | Admitting: Family Medicine

## 2013-05-07 VITALS — BP 122/86 | HR 60 | Ht 69.0 in | Wt 182.0 lb

## 2013-05-07 DIAGNOSIS — IMO0002 Reserved for concepts with insufficient information to code with codable children: Secondary | ICD-10-CM

## 2013-05-07 DIAGNOSIS — E785 Hyperlipidemia, unspecified: Secondary | ICD-10-CM

## 2013-05-07 DIAGNOSIS — F325 Major depressive disorder, single episode, in full remission: Secondary | ICD-10-CM

## 2013-05-07 DIAGNOSIS — Z0000 Encounter for general adult medical examination without abnormal findings: Secondary | ICD-10-CM

## 2013-05-07 DIAGNOSIS — E119 Type 2 diabetes mellitus without complications: Secondary | ICD-10-CM

## 2013-05-07 LAB — POCT URINALYSIS DIPSTICK
BILIRUBIN UA: NEGATIVE
GLUCOSE UA: NEGATIVE
KETONES UA: NEGATIVE
Leukocytes, UA: NEGATIVE
Nitrite, UA: NEGATIVE
Protein, UA: NEGATIVE
RBC UA: NEGATIVE
SPEC GRAV UA: 1.02
Urobilinogen, UA: NEGATIVE
pH, UA: 5

## 2013-05-07 NOTE — Progress Notes (Signed)
Chief Complaint  Patient presents with  . Annual Exam    nonfasting CPE-had labs done in March 2015. Had tooth extraction last Thurs-stopped all meds 2 days prior to extraction. Was told he could restart meds this past Monday, he did restart and is still having some bleeding.    Gregory Friedman is a 63 y.o. male who presents for a complete physical.   He has no concerns.  He was recently seen for a med check.  He sees his cardiologist yearly.  He recently had tooth extraction, and has some intermittent bleeding   Immunization History  Administered Date(s) Administered  . Influenza Split 11/24/2010, 12/06/2011  . Influenza,inj,Quad PF,36+ Mos 11/25/2012  . Pneumococcal Polysaccharide-23 10/21/2009  . Tdap 05/27/2009   Last colonoscopy: 01/2011, due again 2018 Last PSA: 03/2013 Dentist: UTD, recent; goes twice yearly Ophtho:  yearly Exercise:  Walks 30 minutes 4-5x/week.  No weight-bearing activity  Past Medical History  Diagnosis Date  . DM (diabetes mellitus)     type II.   [name] GERD (gastroesophageal reflux disease)   . Hyperlipidemia   . Microscopic hematuria     referred to urology 2012  . Myocardial infarction 2003    with both PCI, and CABG  . BPH (benign prostatic hyperplasia)   . Arthritis   . Anxiety   . Depression   . Hypertension     sees Dr  Tera Helper. [place]ota Devall  . Diverticulosis   . Buccal mucosa squamous cell carcinoma   . S/P radiation therapy     Received 8 fractions, then stopped due to mucositis with Radiation/ Cisplatin  . BPV (benign positional vertigo) 2012    improved after vestibular rehab   Past Surgical History  Procedure Laterality Date  . Fracture surgery Right     RT TALAR REPAIR  . Cardiac catheterization  05/2010  . Cardiovascular stress test  07/2009  . Coronary angioplasty  2003; 2006    2 stents in heart  . Coronary artery bypass graft  01/2003    2 vessels  . Cystoscopy  02/09/2011    Procedure: CYSTOSCOPY;  Surgeon: Marissa Nestle, MD;   Location: [facility];  Service: Urology;  Laterality: N/A;  . Colonoscopy  01/2011    polyp; next due 2018.   . Radical neck dissection Left 03/13/2012    Procedure: RADICAL NECK DISSECTION;  Surgeon: Rozetta Nunnery, MD;  Location: [facility];  Service: ENT;  Laterality: Left;  . Tooth extraction N/A 03/13/2012    Procedure: EXTRACTION MOLARS;  Surgeon: Rozetta Nunnery, MD;  Location: [facility];  Service: ENT;  Laterality: N/A;  . Mass excision N/A 03/13/2012    Procedure: EXCISION LEFT BUCCAL MUCOSA;  Surgeon: Rozetta Nunnery, MD;  Location: [facility];  Service: ENT;  Laterality: N/A;   History   Social History  . Marital Status: Married    Spouse Name: N/A    Number of Children: 3  . Years of Education: N/A   Occupational History  . 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 Smoking status: Never Smoker   . Smokeless tobacco: Never Used  . Alcohol Use: No  . Drug Use: No  . Sexual Activity: Not on file   Other Topics Concern  . Not on file   Social History Narrative   Married;  2 children in [place], son in [place]; 6 grandchildren         Family History  Problem Relation  Age of Onset  . Stroke Sister   . Anesthesia problems Neg Hx   . Hypotension Neg Hx   . Malignant hyperthermia Neg Hx   . Pseudochol deficiency Neg Hx   . Colon cancer Neg Hx   . Heart disease Neg Hx   . Diabetes Brother   . Seizures Mother   . Asthma Father   . Cancer Daughter     thyroid cancer  . Diabetes Sister     Outpatient Encounter Prescriptions as of 05/07/2013  Medication Sig Note  . aspirin 81 MG tablet Take 81 mg by mouth daily.   . Blood Glucose Monitoring Suppl (ACCU-CHEK NANO SMARTVIEW) W/DEVICE KIT Check your blood sugars once daily   . enalapril (VASOTEC) 10 MG tablet TAKE 1 TABLET EVERY DAY   . glucose blood test strip Use as instructed   . Hyaluronic Acid-Vitamin C (HYALURONIC ACID PO) Take 80 mg by mouth daily. Takes 1 tablet daily   . Lancets MISC Please test  blood sugars daily   . metoprolol succinate (TOPROL-XL) 25 MG 24 hr tablet Take 25 mg by mouth at bedtime. 04/08/2012: -  . omeprazole (PRILOSEC) 40 MG capsule Take 40 mg by mouth daily.    [name] OVER THE COUNTER MEDICATION Take 2 tablets by mouth daily.  03/04/2012: Brand name_URINOZINC, for prostate.   . Saw Palmetto, Serenoa repens, 320 MG CAPS Take 1 capsule by mouth daily.   . Saxagliptin-Metformin 2.05-998 MG TB24 Take 2 tablets by mouth daily. 04/07/2013: Takes BID  . simvastatin (ZOCOR) 40 MG tablet TAKE 1 TABLET AT BEDTIME 05/07/2013: Taking daily  . Vitamins C E (VITAMIN C & E COMPLEX) 500-400 MG-UNIT CAPS Take 1 capsule by mouth daily.   . fluticasone (FLONASE) 50 MCG/ACT nasal spray Place 2 sprays into both nostrils daily. 04/07/2013: Takes prn, not daily  . meclizine (MEDI-MECLIZINE) 25 MG tablet Take 0.5-1 tablets (12.5-25 mg total) by mouth 3 (three) times daily as needed for dizziness. 04/07/2013: Takes as needed for vertigo (rare)  . nitroGLYCERIN (NITROSTAT) 0.4 MG SL tablet Place 1 tablet (0.4 mg total) under the tongue every 5 (five) minutes as needed. Chest pain 04/07/2013: Doesn't need/not taking  . ondansetron (ZOFRAN) 8 MG tablet Take 8 mg by mouth as needed.  04/07/2013: Takes prn nausea   No Known Allergies  ROS:  The patient denies anorexia, fever, weight changes, headaches,  vision loss, decreased hearing, ear pain, hoarseness, chest pain, palpitations, dizziness, syncope, dyspnea on exertion, cough, swelling, nausea, vomiting, diarrhea, constipation, abdominal pain, melena, hematochezia, indigestion/heartburn, hematuria, incontinence, erectile dysfunction, nocturia, weakened urine stream, dysuria, genital lesions, joint pains, numbness, tingling, weakness, tremor, suspicious skin lesions, abnormal bruising, or enlarged lymph nodes. Rare vertigo, relieved by meclizine. BPH symptoms have been improved since taking saw palmetto Occasionally feels down, good days and bad  days   PHYSICAL EXAM:  BP 122/86  Pulse 60  Ht _0  (1.753 m)  Wt 182 lb (82.555 kg)  BMI 26.86 kg/m2  General Appearance:    Alert, cooperative, no distress, appears stated age  Head:    Normocephalic, without obvious abnormality, atraumatic  Eyes:    PERRL, conjunctiva/corneas clear, EOM's intact, fundi    benign  Ears:    Normal TM's and external ear canals  Nose:   Nares normal, mucosa pale, mildly edematous, no drainage or sinus tenderness  Throat:   Lips, mucosa, and tongue normal; teeth and gums normal, other than recent extraction of tooth on lower left posterior jaw  Neck:   Supple, no lymphadenopathy;  thyroid:  no   enlargement/tenderness/nodules; no carotid   bruit or JVD  Back:    Spine nontender, no curvature, ROM normal, no CVA     tenderness  Lungs:     Clear to auscultation bilaterally without wheezes, rales or     ronchi; respirations unlabored  Chest Wall:    No tenderness or deformity   Heart:    Regular rate and rhythm, S1 and S2 normal, no murmur, rub   or gallop  Breast Exam:    No chest wall tenderness, masses or gynecomastia  Abdomen:     Soft, non-tender, nondistended, normoactive bowel sounds,    no masses, no hepatosplenomegaly  Genitalia:    Normal male external genitalia without lesions.  Testicles without masses.  No inguinal hernias.  Rectal:    Normal sphincter tone; guaiac negative stool.  Prostate smooth, moderately enlarged, no nodule or mass.  Palpable internal hemorrhoid nontender  Extremities:   No clubbing, cyanosis or edema  Pulses:   2+ and symmetric all extremities  Skin:   Skin color, texture, turgor normal, no rashes or lesions  Lymph nodes:   Cervical, supraclavicular, and axillary nodes normal  Neurologic:   CNII-XII intact, normal strength, sensation and gait; reflexes 2+ and symmetric throughout          Psych:   Normal mood, affect, hygiene and grooming.     Lab Results  Component Value Date   PSA 1.08 04/04/2013   PSA 0.58  08/22/2010   (all other lab results were reviewed at recent med check)  ASSESSMENT/PLAN:  Routine general medical examination at a health care facility - Plan: Visual acuity screening, POCT Urinalysis Dipstick  Depression, major, in remission - some intermittent down moods.  Discussed possible counseling, vs restarting medications if moods worsen   Recommended at least 30 minutes of aerobic activity at least 5 days/week; proper sunscreen use reviewed; healthy diet and alcohol recommendations (less than or equal to 2 drinks/day) reviewed; regular seatbelt use; changing batteries in smoke detectors.  Immunization recommendations discussed-continue yearly flu shots; prevnar 59 at age 34.  Consider zostavax.  Colonoscopy recommendations reviewed--due again 2018  Discussed proper way to use flonase (daily, not prn)  Shingles vaccine recommended--he states that the copay was too expensive  Discussed end of life issues--full code, full care.  Given Living Will and Healthcare power of attorney information and forms.  F/u September for med check with fasting labs prior

## 2013-05-07 NOTE — Patient Instructions (Signed)
  HEALTH MAINTENANCE RECOMMENDATIONS:  It is recommended that you get at least 30 minutes of aerobic exercise at least 5 days/week (for weight loss, you may need as much as 60-90 minutes). This can be any activity that gets your heart rate up. This can be divided in 10-15 minute intervals if needed, but try and build up your endurance at least once a week.  Weight bearing exercise is also recommended twice weekly.  Eat a healthy diet with lots of vegetables, fruits and fiber.  "Colorful" foods have a lot of vitamins (ie green vegetables, tomatoes, red peppers, etc).  Limit sweet tea, regular sodas and alcoholic beverages, all of which has a lot of calories and sugar.  Up to 2 alcoholic drinks daily may be beneficial for men (unless trying to lose weight, watch sugars).  Drink a lot of water.  Sunscreen of at least SPF 30 should be used on all sun-exposed parts of the skin when outside between the hours of 10 am and 4 pm (not just when at beach or pool, but even with exercise, golf, tennis, and yard work!)  Use a sunscreen that says "broad spectrum" so it covers both UVA and UVB rays, and make sure to reapply every 1-2 hours.  Remember to change the batteries in your smoke detectors when changing your clock times in the spring and fall.  Use your seat belt every time you are in a car, and please drive safely and not be distracted with cell phones and texting while driving.  Please consider doing Living Will and HealthCare power of attorney forms (information given to you, with forms)

## 2013-05-15 ENCOUNTER — Other Ambulatory Visit: Payer: Self-pay | Admitting: *Deleted

## 2013-05-15 ENCOUNTER — Ambulatory Visit (HOSPITAL_COMMUNITY): Payer: Medicare HMO

## 2013-05-15 ENCOUNTER — Telehealth: Payer: Self-pay | Admitting: Hematology and Oncology

## 2013-05-15 NOTE — Telephone Encounter (Signed)
Called nurse and left message regarding labs being  drawn after MD visit

## 2013-05-16 ENCOUNTER — Telehealth: Payer: Self-pay | Admitting: *Deleted

## 2013-05-16 ENCOUNTER — Telehealth: Payer: Self-pay | Admitting: Hematology and Oncology

## 2013-05-16 ENCOUNTER — Encounter: Payer: Self-pay | Admitting: *Deleted

## 2013-05-16 NOTE — Telephone Encounter (Signed)
Pt r/s appt to 5/1 lab and MD

## 2013-05-19 ENCOUNTER — Other Ambulatory Visit: Payer: Self-pay

## 2013-05-19 ENCOUNTER — Ambulatory Visit: Payer: Self-pay | Admitting: Hematology and Oncology

## 2013-05-20 ENCOUNTER — Encounter (HOSPITAL_COMMUNITY): Payer: Self-pay

## 2013-05-20 ENCOUNTER — Other Ambulatory Visit (HOSPITAL_BASED_OUTPATIENT_CLINIC_OR_DEPARTMENT_OTHER): Payer: Medicare HMO

## 2013-05-20 ENCOUNTER — Ambulatory Visit (HOSPITAL_COMMUNITY)
Admission: RE | Admit: 2013-05-20 | Discharge: 2013-05-20 | Disposition: A | Discharge status: Home / Self Care | Payer: Medicare HMO | Source: Ambulatory Visit | Attending: Hematology and Oncology | Admitting: Hematology and Oncology

## 2013-05-20 DIAGNOSIS — Z9221 Personal history of antineoplastic chemotherapy: Secondary | ICD-10-CM | POA: Insufficient documentation

## 2013-05-20 DIAGNOSIS — C069 Malignant neoplasm of mouth, unspecified: Secondary | ICD-10-CM | POA: Insufficient documentation

## 2013-05-20 DIAGNOSIS — I708 Atherosclerosis of other arteries: Secondary | ICD-10-CM | POA: Insufficient documentation

## 2013-05-20 DIAGNOSIS — M479 Spondylosis, unspecified: Secondary | ICD-10-CM | POA: Insufficient documentation

## 2013-05-20 DIAGNOSIS — Z951 Presence of aortocoronary bypass graft: Secondary | ICD-10-CM | POA: Insufficient documentation

## 2013-05-20 DIAGNOSIS — C060 Malignant neoplasm of cheek mucosa: Secondary | ICD-10-CM

## 2013-05-20 DIAGNOSIS — Z923 Personal history of irradiation: Secondary | ICD-10-CM | POA: Insufficient documentation

## 2013-05-20 LAB — CBC WITH DIFFERENTIAL/PLATELET
BASO%: 1.3 % (ref 0.0–2.0)
Basophils Absolute: 0.1 10*3/uL (ref 0.0–0.1)
EOS ABS: 0.4 10*3/uL (ref 0.0–0.5)
EOS%: 9.6 % — ABNORMAL HIGH (ref 0.0–7.0)
HCT: 40.5 % (ref 38.4–49.9)
HEMOGLOBIN: 13.5 g/dL (ref 13.0–17.1)
LYMPH%: 26.8 % (ref 14.0–49.0)
MCH: 27.7 pg (ref 27.2–33.4)
MCHC: 33.4 g/dL (ref 32.0–36.0)
MCV: 82.9 fL (ref 79.3–98.0)
MONO#: 0.3 10*3/uL (ref 0.1–0.9)
MONO%: 7.7 % (ref 0.0–14.0)
NEUT%: 54.6 % (ref 39.0–75.0)
NEUTROS ABS: 2.4 10*3/uL (ref 1.5–6.5)
PLATELETS: 188 10*3/uL (ref 140–400)
RBC: 4.89 10*6/uL (ref 4.20–5.82)
RDW: 13.7 % (ref 11.0–14.6)
WBC: 4.3 10*3/uL (ref 4.0–10.3)
lymph#: 1.2 10*3/uL (ref 0.9–3.3)

## 2013-05-20 LAB — COMPREHENSIVE METABOLIC PANEL (CC13)
ALT: 26 U/L (ref 0–55)
ANION GAP: 10 meq/L (ref 3–11)
AST: 28 U/L (ref 5–34)
Albumin: 4.2 g/dL (ref 3.5–5.0)
Alkaline Phosphatase: 88 U/L (ref 40–150)
BUN: 8.9 mg/dL (ref 7.0–26.0)
CO2: 29 meq/L (ref 22–29)
Calcium: 10.2 mg/dL (ref 8.4–10.4)
Chloride: 103 mEq/L (ref 98–109)
Creatinine: 1.4 mg/dL — ABNORMAL HIGH (ref 0.7–1.3)
Glucose: 228 mg/dl — ABNORMAL HIGH (ref 70–140)
POTASSIUM: 3.9 meq/L (ref 3.5–5.1)
Sodium: 141 mEq/L (ref 136–145)
TOTAL PROTEIN: 8 g/dL (ref 6.4–8.3)
Total Bilirubin: 0.41 mg/dL (ref 0.20–1.20)

## 2013-05-23 ENCOUNTER — Other Ambulatory Visit: Payer: Self-pay

## 2013-05-23 ENCOUNTER — Ambulatory Visit (HOSPITAL_BASED_OUTPATIENT_CLINIC_OR_DEPARTMENT_OTHER): Payer: Medicare HMO | Admitting: Hematology and Oncology

## 2013-05-23 ENCOUNTER — Encounter: Payer: Self-pay | Admitting: Hematology and Oncology

## 2013-05-23 VITALS — BP 131/86 | HR 70 | Temp 97.5°F | Resp 18 | Ht 69.0 in | Wt 187.5 lb

## 2013-05-23 DIAGNOSIS — C060 Malignant neoplasm of cheek mucosa: Secondary | ICD-10-CM

## 2013-05-23 DIAGNOSIS — E119 Type 2 diabetes mellitus without complications: Secondary | ICD-10-CM

## 2013-05-23 DIAGNOSIS — R7989 Other specified abnormal findings of blood chemistry: Secondary | ICD-10-CM

## 2013-05-23 DIAGNOSIS — K1370 Unspecified lesions of oral mucosa: Secondary | ICD-10-CM

## 2013-05-23 DIAGNOSIS — R799 Abnormal finding of blood chemistry, unspecified: Secondary | ICD-10-CM

## 2013-05-23 NOTE — Progress Notes (Signed)
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 According to the patient, the cancer was discovered when he complained of a persistent soreness in the left buccal mucosa. The patient had wide local excision followed by modified left neck dissection. He was recommended adjuvant chemoradiation therapy in March 2014 but unable to tolerate chemotherapy due to the severe mucositis. Treatment was terminated early and he was placed on observation  INTERVAL HISTORY: Gregory Friedman 63 y.o. male returns for further followup. He denies any problem with swallowing. No new lesions. He is very mild persistent dry mouth. Denies any new lymphadenopathy.  I have reviewed the past medical history, past surgical history, social history and family history with the patient and they are unchanged from previous note.  ALLERGIES:  has No Known Allergies.  MEDICATIONS:  Current Outpatient Prescriptions  Medication Sig Dispense Refill  . aspirin 81 MG tablet Take 81 mg by mouth daily.      . Blood Glucose Monitoring Suppl (ACCU-CHEK NANO SMARTVIEW) W/DEVICE KIT Check your blood sugars once daily  1 kit  0  . enalapril (VASOTEC) 10 MG tablet TAKE 1 TABLET EVERY DAY  90 tablet  0  . fluticasone (FLONASE) 50 MCG/ACT nasal spray Place 2 sprays into both nostrils daily.      [name] glucose blood test strip Use as instructed  100 each  prn  . Hyaluronic Acid-Vitamin C (HYALURONIC ACID PO) Take 80 mg by mouth daily. Takes  1 tablet daily      . Lancets MISC Please test blood sugars daily  100 each  prn  . meclizine (MEDI-MECLIZINE) 25 MG tablet Take 0.5-1 tablets (12.5-25 mg total) by mouth 3 (three) times daily as needed for dizziness.  30 tablet  0  . metoprolol succinate (TOPROL-XL) 25 MG 24 hr tablet Take 25 mg by mouth at bedtime.      . nitroGLYCERIN (NITROSTAT) 0.4 MG SL tablet Place 1 tablet (0.4 mg total) under the tongue every 5 (five) minutes as needed. Chest pain  25 tablet  1  . omeprazole (PRILOSEC) 40 MG capsule Take 40 mg by mouth daily.       . ondansetron (ZOFRAN) 8 MG tablet Take 8 mg by mouth as needed.       [name] OVER THE COUNTER MEDICATION Take 2 tablets by mouth daily.       . Saw Palmetto, Serenoa repens, 320 MG CAPS Take 1 capsule by mouth daily.      . Saxagliptin-Metformin 2.05-998 MG TB24 Take 2 tablets by mouth daily.  48 tablet  0  . simvastatin (ZOCOR) 40 MG tablet TAKE 1 TABLET AT BEDTIME  90 tablet  3  . Vitamins C E (VITAMIN C & E COMPLEX) 500-400 MG-UNIT CAPS Take 1 capsule by mouth daily.       No current facility-administered medications for this visit.    REVIEW OF SYSTEMS:   Constitutional: Denies fevers, chills or abnormal weight loss Eyes: Denies blurriness of vision Ears, nose, mouth, throat, and face: Denies mucositis or sore throat Respiratory: Denies cough, dyspnea or  wheezes Cardiovascular: Denies palpitation, chest discomfort or lower extremity swelling Gastrointestinal:  Denies nausea, heartburn or change in bowel habits Skin: Denies abnormal skin rashes Lymphatics: Denies new lymphadenopathy or easy bruising Neurological:Denies numbness, tingling or new weaknesses Behavioral/Psych: Mood is stable, no new changes  All other systems were reviewed with the patient and are negative.  PHYSICAL EXAMINATION: ECOG PERFORMANCE STATUS: 0 - Asymptomatic  Filed Vitals:   05/23/13 1000  BP: 131/86  Pulse: 70  Temp: 97.5 F (36.4 C)  Resp: 18   Filed Weights    05/23/13 1000  Weight: 187 lb 8 oz (85.049 kg)    GENERAL:alert, no distress and comfortable SKIN: skin color, texture, turgor are normal, no rashes or significant lesions EYES: normal, Conjunctiva are pink and non-injected, sclera clear OROPHARYNX:no exudate, no erythema and lips, buccal mucosa, and tongue normal  NECK: Well-healed surgical scar with no other abnormalities, thyroid normal size, non-tender, without nodularity LYMPH:  no palpable lymphadenopathy in the cervical, axillary or inguinal LUNGS: clear to auscultation and percussion with normal breathing effort HEART: regular rate & rhythm and no murmurs and no lower extremity edema ABDOMEN:abdomen soft, non-tender and normal bowel sounds Musculoskeletal:no cyanosis of digits and no clubbing  NEURO: alert & oriented x 3 with fluent speech, no focal motor/sensory deficits  LABORATORY DATA:  I have reviewed the data as listed    Component Value Date/Time   NA 141 05/20/2013 1058   NA 139 04/04/2013 1030   K 3.9 05/20/2013 1058   K 4.2 04/04/2013 1030   CL 99 04/04/2013 1030   CL 102 05/15/2012 0917   CO2 29 05/20/2013 1058   CO2 29 04/04/2013 1030   GLUCOSE 228* 05/20/2013 1058   GLUCOSE 93 04/04/2013 1030   GLUCOSE 266* 05/15/2012 0917   BUN 8.9 05/20/2013 1058   BUN 15 04/04/2013 1030   CREATININE 1.4* 05/20/2013 1058   CREATININE 1.21 04/04/2013 1030   CREATININE 1.11 04/10/2012 0740   CALCIUM 10.2 05/20/2013 1058   CALCIUM 10.3 04/04/2013 1030   PROT 8.0 05/20/2013 1058   PROT 7.3 04/04/2013 1030   ALBUMIN 4.2 05/20/2013 1058   ALBUMIN 4.4 04/04/2013 1030   AST 28 05/20/2013 1058   AST 27 04/04/2013 1030   ALT 26 05/20/2013 1058   ALT 31 04/04/2013 1030   ALKPHOS 88 05/20/2013 1058   ALKPHOS 64 04/04/2013 1030   BILITOT 0.41 05/20/2013 1058   BILITOT 0.4 04/04/2013 1030   GFRNONAA 69* 04/10/2012 0740   GFRAA 80* 04/10/2012 0740    No results found for this basename: SPEP,  UPEP,   kappa and lambda light chains    Lab Results   Component Value Date   WBC 4.3 05/20/2013   NEUTROABS 2.4 05/20/2013   HGB 13.5 05/20/2013   HCT 40.5 05/20/2013   MCV 82.9 05/20/2013   PLT 188 05/20/2013      Chemistry      Component Value Date/Time   NA 141 05/20/2013 1058   NA 139 04/04/2013 1030   K 3.9 05/20/2013 1058   K 4.2 04/04/2013 1030   CL 99 04/04/2013 1030   CL 102 05/15/2012 0917   CO2 29 05/20/2013 1058   CO2 29 04/04/2013 1030   BUN 8.9 05/20/2013 1058   BUN 15 04/04/2013 1030   CREATININE 1.4* 05/20/2013 1058   CREATININE 1.21 04/04/2013 1030   CREATININE 1.11 04/10/2012 0740      Component Value Date/Time   CALCIUM 10.2 05/20/2013 1058   CALCIUM 10.3  04/04/2013 1030   ALKPHOS 88 05/20/2013 1058   ALKPHOS 64 04/04/2013 1030   AST 28 05/20/2013 1058   AST 27 04/04/2013 1030   ALT 26 05/20/2013 1058   ALT 31 04/04/2013 1030   BILITOT 0.41 05/20/2013 1058   BILITOT 0.4 04/04/2013 1030       RADIOGRAPHIC STUDIES: I reviewed the imaging study with him and his wife which show no evidence of recurrence. I have personally reviewed the radiological images as listed and agreed with the findings in the report.  ASSESSMENT & PLAN:  #1 squamous cell carcinoma of the left buccal mucosa He has no evidence of disease recurrence. I educated the patient and family about signs and symptoms to watch out for disease recurrence. He'll continue followup with ENT as directed. I will discharge him from the clinic. #2 difficulties opening his mouth This is related to his surgery. Overall he is able to maintain his weight. #3 diabetes #4 elevated creatinine These are stable. I will defer to primary care provider for management. All questions were answered. The patient knows to call the clinic with any problems, questions or concerns. No barriers to learning was detected. I spent 25 minutes counseling the patient face to face. The total time spent in the appointment was 30 minutes and more than 50% was on counseling and review of test results      Heath Lark, MD 05/23/2013 2:44 PM

## 2013-06-09 ENCOUNTER — Other Ambulatory Visit: Payer: Self-pay | Admitting: Family Medicine

## 2013-06-09 ENCOUNTER — Ambulatory Visit (INDEPENDENT_AMBULATORY_CARE_PROVIDER_SITE_OTHER): Payer: Commercial Managed Care - HMO | Admitting: Family Medicine

## 2013-06-09 ENCOUNTER — Encounter: Payer: Self-pay | Admitting: Family Medicine

## 2013-06-09 VITALS — BP 110/80 | HR 60 | Ht 68.0 in | Wt 183.0 lb

## 2013-06-09 DIAGNOSIS — M79609 Pain in unspecified limb: Secondary | ICD-10-CM

## 2013-06-09 DIAGNOSIS — M79674 Pain in right toe(s): Secondary | ICD-10-CM

## 2013-06-09 LAB — CBC WITH DIFFERENTIAL/PLATELET
BASOS ABS: 0.1 10*3/uL (ref 0.0–0.1)
Basophils Relative: 1 % (ref 0–1)
Eosinophils Absolute: 0.3 10*3/uL (ref 0.0–0.7)
Eosinophils Relative: 4 % (ref 0–5)
HCT: 40.5 % (ref 39.0–52.0)
Hemoglobin: 14.2 g/dL (ref 13.0–17.0)
Lymphocytes Relative: 23 % (ref 12–46)
Lymphs Abs: 1.5 10*3/uL (ref 0.7–4.0)
MCH: 27.6 pg (ref 26.0–34.0)
MCHC: 35.1 g/dL (ref 30.0–36.0)
MCV: 78.6 fL (ref 78.0–100.0)
Monocytes Absolute: 0.7 10*3/uL (ref 0.1–1.0)
Monocytes Relative: 10 % (ref 3–12)
NEUTROS ABS: 4.1 10*3/uL (ref 1.7–7.7)
NEUTROS PCT: 62 % (ref 43–77)
Platelets: 175 10*3/uL (ref 150–400)
RBC: 5.15 MIL/uL (ref 4.22–5.81)
RDW: 13.8 % (ref 11.5–15.5)
WBC: 6.6 10*3/uL (ref 4.0–10.5)

## 2013-06-09 MED ORDER — INDOMETHACIN 50 MG PO CAPS: 50.0000 mg | ORAL_CAPSULE | Freq: Three times a day (TID) | ORAL | Status: DC

## 2013-06-09 NOTE — Patient Instructions (Signed)
I suspect that your toe swelling and pain is from gout.  Take the indomethacin three times daily with food only until it is better.  Keep the rest on hand for future recurrences.  If your pain is not improving after 1-2 days, call for the Colchrys medication we also discussed.  Gout Gout is an inflammatory arthritis caused by a buildup of uric acid crystals in the joints. Uric acid is a chemical that is normally present in the blood. When the level of uric acid in the blood is too high it can form crystals that deposit in your joints and tissues. This causes joint redness, soreness, and swelling (inflammation). Repeat attacks are common. Over time, uric acid crystals can form into masses (tophi) near a joint, destroying bone and causing disfigurement. Gout is treatable and often preventable. CAUSES  The disease begins with elevated levels of uric acid in the blood. Uric acid is produced by your body when it breaks down a naturally found substance called purines. Certain foods you eat, such as meats and fish, contain high amounts of purines. Causes of an elevated uric acid level include:  Being passed down from parent to child (heredity).  Diseases that cause increased uric acid production (such as obesity, psoriasis, and certain cancers).  Excessive alcohol use.  Diet, especially diets rich in meat and seafood.  Medicines, including certain cancer-fighting medicines (chemotherapy), water pills (diuretics), and aspirin.  Chronic kidney disease. The kidneys are no longer able to remove uric acid well.  Problems with metabolism. Conditions strongly associated with gout include:  Obesity.  High blood pressure.  High cholesterol.  Diabetes. Not everyone with elevated uric acid levels gets gout. It is not understood why some people get gout and others do not. Surgery, joint injury, and eating too much of certain foods are some of the factors that can lead to gout attacks. SYMPTOMS   An  attack of gout comes on quickly. It causes intense pain with redness, swelling, and warmth in a joint.  Fever can occur.  Often, only one joint is involved. Certain joints are more commonly involved:  Base of the big toe.  Knee.  Ankle.  Wrist.  Finger. Without treatment, an attack usually goes away in a few days to weeks. Between attacks, you usually will not have symptoms, which is different from many other forms of arthritis. DIAGNOSIS  Your caregiver will suspect gout based on your symptoms and exam. In some cases, tests may be recommended. The tests may include:  Blood tests.  Urine tests.  X-rays.  Joint fluid exam. This exam requires a needle to remove fluid from the joint (arthrocentesis). Using a microscope, gout is confirmed when uric acid crystals are seen in the joint fluid. TREATMENT  There are two phases to gout treatment: treating the sudden onset (acute) attack and preventing attacks (prophylaxis).  Treatment of an Acute Attack.  Medicines are used. These include anti-inflammatory medicines or steroid medicines.  An injection of steroid medicine into the affected joint is sometimes necessary.  The painful joint is rested. Movement can worsen the arthritis.  You may use warm or cold treatments on painful joints, depending which works best for you.  Treatment to Prevent Attacks.  If you suffer from frequent gout attacks, your caregiver may advise preventive medicine. These medicines are started after the acute attack subsides. These medicines either help your kidneys eliminate uric acid from your body or decrease your uric acid production. You may need to stay on these  medicines for a very long time.  The early phase of treatment with preventive medicine can be associated with an increase in acute gout attacks. For this reason, during the first few months of treatment, your caregiver may also advise you to take medicines usually used for acute gout treatment.  Be sure you understand your caregiver's directions. Your caregiver may make several adjustments to your medicine dose before these medicines are effective.  Discuss dietary treatment with your caregiver or dietitian. Alcohol and drinks high in sugar and fructose and foods such as meat, poultry, and seafood can increase uric acid levels. Your caregiver or dietician can advise you on drinks and foods that should be limited. HOME CARE INSTRUCTIONS   Do not take aspirin to relieve pain. This raises uric acid levels.  Only take over-the-counter or prescription medicines for pain, discomfort, or fever as directed by your caregiver.  Rest the joint as much as possible. When in bed, keep sheets and blankets off painful areas.  Keep the affected joint raised (elevated).  Apply warm or cold treatments to painful joints. Use of warm or cold treatments depends on which works best for you.  Use crutches if the painful joint is in your leg.  Drink enough fluids to keep your urine clear or pale yellow. This helps your body get rid of uric acid. Limit alcohol, sugary drinks, and fructose drinks.  Follow your dietary instructions. Pay careful attention to the amount of protein you eat. Your daily diet should emphasize fruits, vegetables, whole grains, and fat-free or low-fat milk products. Discuss the use of coffee, vitamin C, and cherries with your caregiver or dietician. These may be helpful in lowering uric acid levels.  Maintain a healthy body weight. SEEK MEDICAL CARE IF:   You develop diarrhea, vomiting, or any side effects from medicines.  You do not feel better in 24 hours, or you are getting worse. SEEK IMMEDIATE MEDICAL CARE IF:   Your joint becomes suddenly more tender, and you have chills or a fever. MAKE SURE YOU:   Understand these instructions.  Will watch your condition.  Will get help right away if you are not doing well or get worse. Document Released: 01/07/2000 Document  Revised: 05/06/2012 Document Reviewed: 08/23/2011 [facility] Patient Information 2014 [place].

## 2013-06-09 NOTE — Progress Notes (Signed)
Chief Complaint  Patient presents with  . toe pain    pt states he woke up with a swollen big toe on his right foot and not sure what happen   He awoke on 5/16 with excruciating pain in his right great toe.  It was swollen and red.  He denies any trauma or injury.  He denies any h/o gout or similar problems in the past. He took leftover hydrocodone over the weekend which helped decrease the pain some.  He also tried some topical menthol medication.  Has not used any anti-inflammatories.  Denies any change in diet--no alcohol, no red meat. He denies fevers, chills.  Past Medical History  Diagnosis Date  . DM (diabetes mellitus)     type II.   [name] GERD (gastroesophageal reflux disease)   . Hyperlipidemia   . Microscopic hematuria     referred to urology 2012  . Myocardial infarction 2003    with both PCI, and CABG  . BPH (benign prostatic hyperplasia)   . Arthritis   . Anxiety   . Depression   . Hypertension     sees Dr  Tera Helper. [place] [facility]  . Diverticulosis   . S/P radiation therapy     Received 8 fractions, then stopped due to mucositis with Radiation/ Cisplatin  . BPV (benign positional vertigo) 2012    improved after vestibular rehab  . Buccal mucosa squamous cell carcinoma dx'd 01/2012    xrt and chemo comp 03/2012   Past Surgical History  Procedure Laterality Date  . Fracture surgery Right     RT TALAR REPAIR  . Cardiac catheterization  05/2010  . Cardiovascular stress test  07/2009  . Coronary angioplasty  2003; 2006    2 stents in heart  . Coronary artery bypass graft  01/2003    2 vessels  . Cystoscopy  02/09/2011    Procedure: CYSTOSCOPY;  Surgeon: Marissa Nestle, MD;  Location: [facility];  Service: Urology;  Laterality: N/A;  . Colonoscopy  01/2011    polyp; next due 2018.   . Radical neck dissection Left 03/13/2012    Procedure: RADICAL NECK DISSECTION;  Surgeon: Rozetta Nunnery, MD;  Location: [facility];  Service: ENT;  Laterality: Left;  . Tooth extraction N/A 03/13/2012   Procedure: EXTRACTION MOLARS;  Surgeon: Rozetta Nunnery, MD;  Location: [facility] [place];  Service: ENT;  Laterality: N/A;  . Mass excision N/A 03/13/2012    Procedure: EXCISION LEFT BUCCAL MUCOSA;  Surgeon: Rozetta Nunnery, MD;  Location: [facility] [place];  Service: ENT;  Laterality: N/A;   History   Social History  . Marital Status: Married    Spouse Name: N/A    Number of Children: 3  . Years of Education: N/A   Occupational History  . 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 Smoking status: Never Smoker   . Smokeless tobacco: Never Used  . Alcohol Use: No  . Drug Use: No  . Sexual Activity: Not on file   Other Topics Concern  . Not on file   Social History Narrative   Married;  2 children in [place], son in [place]; 6 grandchildren          Outpatient Encounter Prescriptions as of 06/09/2013  Medication Sig Note  . aspirin 81 MG tablet Take 81 mg by mouth daily.   . Blood Glucose Monitoring Suppl (ACCU-CHEK NANO SMARTVIEW) W/DEVICE KIT Check your blood sugars once daily   .  enalapril (VASOTEC) 10 MG tablet TAKE 1 TABLET EVERY DAY   . fluticasone (FLONASE) 50 MCG/ACT nasal spray Place 2 sprays into both nostrils daily. 06/09/2013: Using daily  . glucose blood test strip Use as instructed   . Hyaluronic Acid-Vitamin C (HYALURONIC ACID PO) Take 80 mg by mouth daily. Takes 1 tablet daily   . Lancets MISC Please test blood sugars daily   . meclizine (MEDI-MECLIZINE) 25 MG tablet Take 0.5-1 tablets (12.5-25 mg total) by mouth 3 (three) times daily as needed for dizziness. 04/07/2013: Takes as needed for vertigo (rare)  . metoprolol succinate (TOPROL-XL) 25 MG 24 hr tablet TAKE 1 TABLET EVERY DAY   . omeprazole (PRILOSEC) 40 MG capsule TAKE 1 CAPSULE EVERY DAY   . OVER THE COUNTER MEDICATION Take 2 tablets by mouth daily.  03/04/2012: Brand name_URINOZINC, for prostate.   . Saw Palmetto, Serenoa repens, 320 MG CAPS Take 1 capsule by mouth daily.   . Saxagliptin-Metformin  2.05-998 MG TB24 Take 2 tablets by mouth daily. 04/07/2013: Takes BID  . simvastatin (ZOCOR) 40 MG tablet TAKE 1 TABLET AT BEDTIME 05/07/2013: Taking daily  . Vitamins C E (VITAMIN C & E COMPLEX) 500-400 MG-UNIT CAPS Take 1 capsule by mouth daily.   . nitroGLYCERIN (NITROSTAT) 0.4 MG SL tablet Place 1 tablet (0.4 mg total) under the tongue every 5 (five) minutes as needed. Chest pain 04/07/2013: Doesn't need/not taking  . ondansetron (ZOFRAN) 8 MG tablet Take 8 mg by mouth as needed.  04/07/2013: Takes prn nausea   No Known Allergies  ROS:  Denies fevers, chills . Denies other skin lesions.  Denies URI symptoms, headaches. Vertigo remains mild/intermittent.  No chest pain, shortness of breath or other complaints.  PHYSICAL EXAM: BP 110/80  Pulse 60  Ht 5' 8"  (1.727 m)  Wt 183 lb (83.008 kg)  BMI 27.83 kg/m2 Pleasant male, in mod discomfort with walking, otherwise in no distress Extremities: Right great toe has redness, warmth and swelling over the proximal 1st toe at IP joint.  There is minimal redness, and no pain with movement of the 1st MTP joint.  Skin is intact without breakage, crusting, flaking.  Pulses 2+  ASSESSMENT/PLAN:  Pain of right great toe - suspect gout - Plan: indomethacin (INDOCIN) 50 MG capsule, CBC with Differential, Uric acid  Suspect gout in right great toe. Given that he is a diabetic, check CBC for infection Check uric acid (although likely not accurate in acute flare)--will need to be rechecked in future.  Since he has not yet tried an NSAID, will treat with NSAID.  Consider colchrys if pain isn't adequately controlled with indocin.  Reviewed risks/side effects--to call in 1-2 days for colchrys if not improving.  Return in 1 week if not resolving.

## 2013-06-10 LAB — URIC ACID: URIC ACID, SERUM: 7 mg/dL (ref 4.0–7.8)

## 2013-10-07 ENCOUNTER — Other Ambulatory Visit: Payer: Commercial Managed Care - HMO

## 2013-10-09 ENCOUNTER — Encounter: Payer: Commercial Managed Care - HMO | Admitting: Family Medicine

## 2013-10-10 ENCOUNTER — Other Ambulatory Visit: Payer: Commercial Managed Care - HMO

## 2013-10-10 DIAGNOSIS — E785 Hyperlipidemia, unspecified: Secondary | ICD-10-CM

## 2013-10-10 DIAGNOSIS — E119 Type 2 diabetes mellitus without complications: Secondary | ICD-10-CM

## 2013-10-11 LAB — HEPATIC FUNCTION PANEL
ALK PHOS: 47 U/L (ref 39–117)
ALT: 30 U/L (ref 0–53)
AST: 31 U/L (ref 0–37)
Albumin: 4.4 g/dL (ref 3.5–5.2)
BILIRUBIN DIRECT: 0.2 mg/dL (ref 0.0–0.3)
BILIRUBIN INDIRECT: 0.5 mg/dL (ref 0.2–1.2)
BILIRUBIN TOTAL: 0.7 mg/dL (ref 0.2–1.2)
Total Protein: 7.2 g/dL (ref 6.0–8.3)

## 2013-10-11 LAB — LIPID PANEL
CHOL/HDL RATIO: 3.2 ratio
Cholesterol: 120 mg/dL (ref 0–200)
HDL: 37 mg/dL — ABNORMAL LOW (ref >39)
LDL Cholesterol: 43 mg/dL (ref 0–99)
Triglycerides: 201 mg/dL — ABNORMAL HIGH (ref <150)
VLDL: 40 mg/dL (ref 0–40)

## 2013-10-11 LAB — HEMOGLOBIN A1C
Hgb A1c MFr Bld: 7.4 % — ABNORMAL HIGH (ref <5.7)
Mean Plasma Glucose: 166 mg/dL — ABNORMAL HIGH (ref <117)

## 2013-10-11 LAB — GLUCOSE, RANDOM: GLUCOSE: 114 mg/dL — AB (ref 70–99)

## 2013-10-13 ENCOUNTER — Encounter: Payer: Self-pay | Admitting: Family Medicine

## 2013-10-13 ENCOUNTER — Ambulatory Visit (INDEPENDENT_AMBULATORY_CARE_PROVIDER_SITE_OTHER): Payer: Commercial Managed Care - HMO | Admitting: Family Medicine

## 2013-10-13 VITALS — BP 138/86 | HR 64 | Ht 68.0 in | Wt 192.0 lb

## 2013-10-13 DIAGNOSIS — R1310 Dysphagia, unspecified: Secondary | ICD-10-CM

## 2013-10-13 DIAGNOSIS — E1165 Type 2 diabetes mellitus with hyperglycemia: Secondary | ICD-10-CM

## 2013-10-13 DIAGNOSIS — E785 Hyperlipidemia, unspecified: Secondary | ICD-10-CM

## 2013-10-13 DIAGNOSIS — IMO0001 Reserved for inherently not codable concepts without codable children: Secondary | ICD-10-CM

## 2013-10-13 DIAGNOSIS — E119 Type 2 diabetes mellitus without complications: Secondary | ICD-10-CM

## 2013-10-13 DIAGNOSIS — I10 Essential (primary) hypertension: Secondary | ICD-10-CM

## 2013-10-13 MED ORDER — SAXAGLIPTIN-METFORMIN ER 2.5-1000 MG PO TB24: 2.0000 | ORAL_TABLET | Freq: Every day | ORAL | Status: DC

## 2013-10-13 NOTE — Progress Notes (Signed)
Chief Complaint  Patient presents with  . med check    med check, had labs done last week. would like flu shot but just not today, will come back to get it., no other concerns, needs metformin sent [facility] [address] in [place]   Hyperlipidemia follow-up: Patient is reportedly following a low-fat, low cholesterol diet. Compliant with medications and denies medication side effects. He hasn't been using fish oil in a while.  Patient presents for follow-up on his diabetes. His kombiglyze dose is twice daily and has no side effects. He sometimes has trouble swallowing the pills, sometimes needs to drink more water or have a piece of bread to help it pass. This doesn't happen daily, but at least a couple of times/week. Blood sugars are 95-120 fasting, and  160-180 after meals. He sometimes has been eating late dinners. Denies any change in his diet.  He is drinking more water (lemon water every morning).  He takes his pills with apple juice in the morning (8 oz). He finds it easier to swallow his pills with the juice rather than water.  Occasionally feels slightly sweaty in the mornings but sugar is normal, in the 90's. Denies any hypoglycemia. Last eye exam was 2-3 years ago, aware that he needs to schedule.   Hypertension follow-up: Blood pressures elsewhere are 130/80, sometimes up to 140. Denies dizziness, headaches, chest pain. Denies side effects of medications. He is trying to limit salt in his diet.   He is walking more in the neighborhood--15 minutes three times/week   Allergies--not currently flaring.  Gout in R great toe in May--took indocin for 3-4 days, and hasn't had any recurrence of pain or swelling.  Past Medical History  Diagnosis Date  . DM (diabetes mellitus)     type II.   [name] GERD (gastroesophageal reflux disease)   . Hyperlipidemia   . Microscopic hematuria     referred to urology 2012  . Myocardial infarction 2003    with both PCI, and CABG  . BPH (benign prostatic  hyperplasia)   . Arthritis   . Anxiety   . Depression   . Hypertension     sees Dr  Tera Helper. [place]  . Diverticulosis   . S/P radiation therapy     Received 8 fractions, then stopped due to mucositis with Radiation/ Cisplatin  . BPV (benign positional vertigo) 2012    improved after vestibular rehab  . Buccal mucosa squamous cell carcinoma dx'd 01/2012    xrt and chemo comp 03/2012   Past Surgical History  Procedure Laterality Date  . Fracture surgery Right     RT TALAR REPAIR  . Cardiac catheterization  05/2010  . Cardiovascular stress test  07/2009  . Coronary angioplasty  2003; 2006    2 stents in heart  . Coronary artery bypass graft  01/2003    2 vessels  . Cystoscopy  02/09/2011    Procedure: CYSTOSCOPY;  Surgeon: Marissa Nestle, MD;  Location: [facility];  Service: Urology;  Laterality: N/A;  . Colonoscopy  01/2011    polyp; next due 2018.   . Radical neck dissection Left 03/13/2012    Procedure: RADICAL NECK DISSECTION;  Surgeon: Rozetta Nunnery, MD;  Location: [facility];  Service: ENT;  Laterality: Left;  . Tooth extraction N/A 03/13/2012    Procedure: EXTRACTION MOLARS;  Surgeon: Rozetta Nunnery, MD;  Location: [facility];  Service: ENT;  Laterality: N/A;  . Mass excision N/A 03/13/2012    Procedure: EXCISION  LEFT BUCCAL MUCOSA;  Surgeon: Rozetta Nunnery, MD;  Location: [facility];  Service: ENT;  Laterality: N/A;   History   Social History  . Marital Status: Married    Spouse Name: N/A    Number of Children: 3  . Years of Education: N/A   Occupational History  . 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 Smoking status: Never Smoker   . Smokeless tobacco: Never Used  . Alcohol Use: No  . Drug Use: No  . Sexual Activity: Not on file   Other Topics Concern  . Not on file   Social History Narrative   Married;  2 children in [place], son in [place]; 6 grandchildren         Outpatient Encounter Prescriptions as of 10/13/2013  Medication Sig Note  .  aspirin 81 MG tablet Take 81 mg by mouth daily.   . Blood Glucose Monitoring Suppl (ACCU-CHEK NANO SMARTVIEW) W/DEVICE KIT Check your blood sugars once daily   . enalapril (VASOTEC) 10 MG tablet TAKE 1 TABLET EVERY DAY   . glucose blood test strip Use as instructed   . Hyaluronic Acid-Vitamin C (HYALURONIC ACID PO) Take 80 mg by mouth daily. Takes 1 tablet daily   . Lancets MISC Please test blood sugars daily   . metoprolol succinate (TOPROL-XL) 25 MG 24 hr tablet TAKE 1 TABLET EVERY DAY   . omeprazole (PRILOSEC) 40 MG capsule TAKE 1 CAPSULE EVERY DAY   . OVER THE COUNTER MEDICATION Take 2 tablets by mouth daily.  03/04/2012: Brand name_URINOZINC, for prostate.   . Saw Palmetto, Serenoa repens, 320 MG CAPS Take 1 capsule by mouth daily.   . Saxagliptin-Metformin 2.05-998 MG TB24 Take 2 tablets by mouth daily. 04/07/2013: Takes BID  . simvastatin (ZOCOR) 40 MG tablet TAKE 1 TABLET AT BEDTIME 05/07/2013: Taking daily  . Vitamins C E (VITAMIN C & E COMPLEX) 500-400 MG-UNIT CAPS Take 1 capsule by mouth daily.   . fluticasone (FLONASE) 50 MCG/ACT nasal spray Place 2 sprays into both nostrils daily. 10/13/2013: Uses prn, not currently (usually in the Spring)  . indomethacin (INDOCIN) 50 MG capsule Take 1 capsule (50 mg total) by mouth 3 (three) times daily with meals. 10/13/2013: Uses prn--not needing  . meclizine (MEDI-MECLIZINE) 25 MG tablet Take 0.5-1 tablets (12.5-25 mg total) by mouth 3 (three) times daily as needed for dizziness. 04/07/2013: Takes as needed for vertigo (rare)  . nitroGLYCERIN (NITROSTAT) 0.4 MG SL tablet Place 1 tablet (0.4 mg total) under the tongue every 5 (five) minutes as needed. Chest pain 04/07/2013: Doesn't need/not taking  . ondansetron (ZOFRAN) 8 MG tablet Take 8 mg by mouth as needed.  04/07/2013: Takes prn nausea   No Known Allergies  ROS:  No fevers, chills, URI symptoms, headaches, dizziness, chest pain, shortness of breath, edema, nausea, vomiting, heartburn, bowel  changes.  +intermittent dysphagia, mainly related to swallowing kombiglyze, as per HPI.  No urinary complaints, bleeding, bruising, rash.  Moods are stable. No other complaints, except as per HPI.  PHYSICAL EXAM: BP 138/86  Pulse 64  Ht 5' 8"  (1.727 m)  Wt 192 lb (87.091 kg)  BMI 29.20 kg/m2 Well developed, pleasant male in no distress HEENT:  PERRL, EOMi, conjunctiva clear OP clear Neck: no lymphadenopathy, thyromegaly or bruit Heart: regular rate and rhythm Lungs: clear bilaterally Abdomen: soft, nontender, no organomegaly or mass Extremities: no edema, normal pulses Psych: normal mood, affect, hygiene and grooming Neuro:  alert and oriented. Cranial nerves intact. Normal strength, gait.   Lab Results  Component Value Date   HGBA1C 7.4* 10/10/2013   Fasting glucose 114  Lab Results  Component Value Date   CHOL 120 10/10/2013   HDL 37* 10/10/2013   LDLCALC 43 10/10/2013   TRIG 201* 10/10/2013   CHOLHDL 3.2 10/10/2013   Lab Results  Component Value Date   ALT 30 10/10/2013   AST 31 10/10/2013   ALKPHOS 47 10/10/2013   BILITOT 0.7 10/10/2013    ASSESSMENT/PLAN:  HYPERLIPIDEMIA-MIXED - elevated TG.  LDL at goal. Reviewed diet.  restart fish oil  HYPERTENSION, BENIGN - controlled, although borderline by history.  Daily exercise, low sodium diet  Type II or unspecified type diabetes mellitus without mention of complication, not stated as uncontrolled - suboptimally controlled, with A1c now >7.  Reviewed proper diet, need for daily exercise.  declines change in meds. Dietary trial x 3 mos. Past due ophtho  Dysphagia, unspecified(787.20) - intermittent, related to swallowing kombiglyze.  f/u with GI if persists (may need repeat EGD with dilatation)   "not mentally prepared for flu shot today"--will call and schedule [place]  Reinforced the need for diabetic eye exam (especially with A1c's periodically >7)l--sees doctor by [place] ([place]---pt states he will set it up, and call  here if referral is needed (likely is).  Increase your aerobic exercise to 30 minutes 5-6 times/week (it can be 15 minutes twice daily, and not all at once). Advised to cut back on juice intake.  Diet reviewed in detail.  F/u 3 months with A1c at visit Set up CPE for April ' If worsening trouble swallowing, follow up with GI

## 2013-10-13 NOTE — Patient Instructions (Addendum)
You must set up your eye exam for diabetic eye check-up.  Cut back on your juice to no more than 3-4 ounces/day.  Try taking your medications with water.  Increase your aerobic exercise to 30 minutes 5-6 times/week (it can be 15 minutes twice daily, and not all at once).  If worsening trouble swallowing, follow up with GI

## 2013-11-10 ENCOUNTER — Telehealth: Payer: Self-pay | Admitting: Internal Medicine

## 2013-11-10 NOTE — Telephone Encounter (Signed)
The PT is only for vertigo, not for ringing in his ear. He should f/u with his ENT

## 2013-11-10 NOTE — Telephone Encounter (Signed)
Pt is having really bad ringing in the ears that is constant.his ENT Doctor gave him Bioflatator pills for this but not helping anything.  Pt is wanting a referral back to [facility] again. He has been there for vertigo. Is this okay to refer him there. [facility] number is [phone]

## 2013-11-10 NOTE — Telephone Encounter (Signed)
Pt was notified to call his ENT and follow-up with them

## 2013-12-22 ENCOUNTER — Other Ambulatory Visit: Payer: Self-pay | Admitting: Family Medicine

## 2014-01-01 ENCOUNTER — Other Ambulatory Visit (INDEPENDENT_AMBULATORY_CARE_PROVIDER_SITE_OTHER): Payer: Commercial Managed Care - HMO

## 2014-01-01 DIAGNOSIS — Z23 Encounter for immunization: Secondary | ICD-10-CM

## 2014-01-12 ENCOUNTER — Encounter: Payer: Commercial Managed Care - HMO | Admitting: Family Medicine

## 2014-01-14 ENCOUNTER — Ambulatory Visit (INDEPENDENT_AMBULATORY_CARE_PROVIDER_SITE_OTHER): Payer: Commercial Managed Care - HMO | Admitting: Family Medicine

## 2014-01-14 ENCOUNTER — Encounter: Payer: Self-pay | Admitting: Family Medicine

## 2014-01-14 VITALS — BP 132/84 | HR 60 | Ht 68.0 in | Wt 189.0 lb

## 2014-01-14 DIAGNOSIS — I10 Essential (primary) hypertension: Secondary | ICD-10-CM

## 2014-01-14 DIAGNOSIS — E119 Type 2 diabetes mellitus without complications: Secondary | ICD-10-CM

## 2014-01-14 DIAGNOSIS — E782 Mixed hyperlipidemia: Secondary | ICD-10-CM

## 2014-01-14 MED ORDER — EMPAGLIFLOZIN 10 MG PO TABS: 10.0000 mg | ORAL_TABLET | Freq: Every day | ORAL | Status: DC

## 2014-01-14 NOTE — Patient Instructions (Addendum)
Start taking Jardiance 10mg  once daily (in the morning). Continue to take the kombiglyze at 2 tablets every day (your current diabetic medication). Schedule your diabetic eye exam for January. Try and exercise 30 minutes daily (even if divided up into 10-15 minute intervals).  Return for your physical, have fasting labs done prior.  Return sooner if you aren't tolerating the new medication, if you have side effects, sugars are high, or any other concerns.

## 2014-01-14 NOTE — Progress Notes (Signed)
Chief Complaint  Patient presents with  . med check    med check. no other concerns   Patient presents for follow-up on his diabetes. His A1c 3 months ago was 7.4.  He declined any change in medications at that time, preferred 3 month trial of diet instead. He presents for follow-up.   His kombiglyze dose is two tablets once daily.  He is compliant with taking his medication, and no longer having any trouble swallowing the medication. He didn't bring in list of sugars. He recalls that his blood sugars are 95-115 fasting, and 160-170 after meals. He has been eating more greens, more salads. He cut back on rice and juice (just 4-6 ounces with medications in the morning). He is drinking more water (lemon water every morning).Occasionally still feels slightly sweaty in the mornings but sugar is normal, in the 90's--this has decreased in frequency. Denies any hypoglycemia. Last eye exam was 2-3 years ago, aware that he needs to schedule.  He plans to schedule in January.  He is in the donut hole and had to pay $200 for his prescription, so didn't have the money for eye exam currently.  Hypertension follow-up: Blood pressures elsewhere run around 130/80. Denies dizziness, headaches, chest pain. Denies side effects of medications. He is trying to limit salt in his diet.   He is walking some in the neighborhood--20-25 minutes twice each week .  No other regular exercise. The gym moved further away, so he hasn't gone, and the weather has been rainy.  He is complaining of a stiff neck, with crackling when he turns his head. Started 3 days ago.  Some topical creams have helped some.  No numbness/tingling/weakness  PMH, PSH, SH reviewed. Outpatient Encounter Prescriptions as of 01/14/2014  Medication Sig Note  . aspirin 81 MG tablet Take 81 mg by mouth daily.   . Blood Glucose Monitoring Suppl (ACCU-CHEK NANO SMARTVIEW) W/DEVICE KIT Check your blood sugars once daily   . enalapril (VASOTEC) 10 MG tablet  TAKE 1 TABLET EVERY DAY   . Hyaluronic Acid-Vitamin C (HYALURONIC ACID PO) Take 80 mg by mouth daily. Takes 1 tablet daily   . Lancets MISC Please test blood sugars daily   . metoprolol succinate (TOPROL-XL) 25 MG 24 hr tablet TAKE 1 TABLET EVERY DAY   . omeprazole (PRILOSEC) 40 MG capsule TAKE 1 CAPSULE EVERY DAY   . OVER THE COUNTER MEDICATION Take 2 tablets by mouth daily.  03/04/2012: Brand name_URINOZINC, for prostate.   . Saw Palmetto, Serenoa repens, 320 MG CAPS Take 1 capsule by mouth daily.   . Saxagliptin-Metformin 2.05-998 MG TB24 Take 2 tablets by mouth daily.   . simvastatin (ZOCOR) 40 MG tablet TAKE 1 TABLET AT BEDTIME   . Vitamins C E (VITAMIN C & E COMPLEX) 500-400 MG-UNIT CAPS Take 1 capsule by mouth daily.   . empagliflozin (JARDIANCE) 10 MG TABS tablet Take 10 mg by mouth daily.   . fluticasone (FLONASE) 50 MCG/ACT nasal spray Place 2 sprays into both nostrils daily. 10/13/2013: Uses prn, not currently (usually in the Spring)  . glucose blood test strip Use as instructed   . indomethacin (INDOCIN) 50 MG capsule Take 1 capsule (50 mg total) by mouth 3 (three) times daily with meals. (Patient not taking: Reported on 01/14/2014) 10/13/2013: Uses prn--not needing  . meclizine (MEDI-MECLIZINE) 25 MG tablet Take 0.5-1 tablets (12.5-25 mg total) by mouth 3 (three) times daily as needed for dizziness. (Patient not taking: Reported on 01/14/2014) 04/07/2013: Dewaine Conger  as needed for vertigo (rare)  . nitroGLYCERIN (NITROSTAT) 0.4 MG SL tablet Place 1 tablet (0.4 mg total) under the tongue every 5 (five) minutes as needed. Chest pain (Patient not taking: Reported on 01/14/2014) 04/07/2013: Doesn't need/not taking  . ondansetron (ZOFRAN) 8 MG tablet Take 8 mg by mouth as needed.  04/07/2013: Takes prn nausea  . [DISCONTINUED] empagliflozin (JARDIANCE) 10 MG TABS tablet Take 10 mg by mouth daily.    (Jardiance rx'd today, not prior to visit).  No Known Allergies  ROS:  No fevers, chills, URI  symptoms.  He has some intermittent mild vertigo, short-lived, and some occasional ringing in his ears.  Denies chest pain, shortness of breath, headaches, dizziness, myalgias, GI complaints.  See HPI.  +neck spasm x 3 days.  PHYSICAL EXAM: BP 132/84 mmHg  Pulse 60  Ht 5' 8"  (1.727 m)  Wt 189 lb (85.73 kg)  BMI 28.74 kg/m2  Well-appearing, pleasant male, holding his neck slightly stiffly during exam, but doesn't appear to be in distress. Exam/visit was limited to discussion of diet, sugars, exercise, and risks/side effects of medications and suboptimally treated diabetes  A1c 7.3 (was 7.4 three months ago)  ASSESSMENT/PLAN:  Type 2 diabetes mellitus not at goal - Plan: empagliflozin (JARDIANCE) 10 MG TABS tablet, DISCONTINUED: empagliflozin (JARDIANCE) 10 MG TABS tablet  HTN--borderline/okay  He knows that he could do more as far as exercise, and initially preferred to "try harder" rather than change medications. We discussed limitations, and that given very minimal improvement over the last 3 months, low risk of side effects and potential long-term risks of suboptimally controlled diabetes, he was willing to add low dose Jardiance.  Risks side effects reviewed in detail.  Neck pain/muscle spasm (given hx--not examined today) Heat/stretch/massage Return for eval if not improving  Diabetic eye exam past due--encouraged to schedule in January Start Jardiance 30m.  #21 samples given, to last until out of donut hole and try before filling rx Daily exercise encouraged, along with proper diet.   He was told to schedule labs prior to CPE--after visit I realize he has [employer].  He cannot have med check and CPE on same date, so he should come to CPE fasting, get labs drawn then, and schedule med check for the following week to review labs and get refills.  c-met, lipid, A1c, uric acid, CBC, TSH, urine microalb

## 2014-01-15 LAB — POCT GLYCOSYLATED HEMOGLOBIN (HGB A1C): Hemoglobin A1C: 7.3

## 2014-01-15 NOTE — Addendum Note (Signed)
Addended by: Minette Headland A on: 01/15/2014 07:53 AM   Modules accepted: Orders

## 2014-01-15 NOTE — Progress Notes (Signed)
Pt is coming in for his fasting cpe on 4/21 and then the next week coming back to go over labs and get refills on meds 05/20/13

## 2014-02-17 ENCOUNTER — Encounter: Payer: Self-pay | Admitting: *Deleted

## 2014-02-18 ENCOUNTER — Other Ambulatory Visit: Payer: Self-pay | Admitting: Family Medicine

## 2014-03-06 ENCOUNTER — Encounter: Payer: Self-pay | Admitting: Gastroenterology

## 2014-03-10 DIAGNOSIS — J029 Acute pharyngitis, unspecified: Secondary | ICD-10-CM | POA: Diagnosis not present

## 2014-05-14 ENCOUNTER — Ambulatory Visit (INDEPENDENT_AMBULATORY_CARE_PROVIDER_SITE_OTHER): Payer: Commercial Managed Care - HMO | Admitting: Family Medicine

## 2014-05-14 ENCOUNTER — Encounter: Payer: Self-pay | Admitting: Family Medicine

## 2014-05-14 VITALS — BP 140/90 | HR 68 | Ht 69.0 in | Wt 192.0 lb

## 2014-05-14 DIAGNOSIS — Z7189 Other specified counseling: Secondary | ICD-10-CM

## 2014-05-14 DIAGNOSIS — K219 Gastro-esophageal reflux disease without esophagitis: Secondary | ICD-10-CM | POA: Insufficient documentation

## 2014-05-14 DIAGNOSIS — E1165 Type 2 diabetes mellitus with hyperglycemia: Secondary | ICD-10-CM

## 2014-05-14 DIAGNOSIS — C060 Malignant neoplasm of cheek mucosa: Secondary | ICD-10-CM

## 2014-05-14 DIAGNOSIS — R198 Other specified symptoms and signs involving the digestive system and abdomen: Secondary | ICD-10-CM

## 2014-05-14 DIAGNOSIS — E782 Mixed hyperlipidemia: Secondary | ICD-10-CM

## 2014-05-14 DIAGNOSIS — F324 Major depressive disorder, single episode, in partial remission: Secondary | ICD-10-CM | POA: Diagnosis not present

## 2014-05-14 DIAGNOSIS — Z0000 Encounter for general adult medical examination without abnormal findings: Secondary | ICD-10-CM

## 2014-05-14 DIAGNOSIS — IMO0002 Reserved for concepts with insufficient information to code with codable children: Secondary | ICD-10-CM

## 2014-05-14 DIAGNOSIS — I10 Essential (primary) hypertension: Secondary | ICD-10-CM | POA: Diagnosis not present

## 2014-05-14 DIAGNOSIS — F325 Major depressive disorder, single episode, in full remission: Secondary | ICD-10-CM

## 2014-05-14 DIAGNOSIS — E119 Type 2 diabetes mellitus without complications: Secondary | ICD-10-CM | POA: Diagnosis not present

## 2014-05-14 DIAGNOSIS — Z5181 Encounter for therapeutic drug level monitoring: Secondary | ICD-10-CM | POA: Diagnosis not present

## 2014-05-14 DIAGNOSIS — Z125 Encounter for screening for malignant neoplasm of prostate: Secondary | ICD-10-CM

## 2014-05-14 DIAGNOSIS — K6289 Other specified diseases of anus and rectum: Secondary | ICD-10-CM

## 2014-05-14 LAB — CBC WITH DIFFERENTIAL/PLATELET
Basophils Absolute: 0.1 10*3/uL (ref 0.0–0.1)
Basophils Relative: 2 % — ABNORMAL HIGH (ref 0–1)
Eosinophils Absolute: 0.3 10*3/uL (ref 0.0–0.7)
Eosinophils Relative: 7 % — ABNORMAL HIGH (ref 0–5)
HCT: 41.3 % (ref 39.0–52.0)
Hemoglobin: 13.8 g/dL (ref 13.0–17.0)
Lymphocytes Relative: 32 % (ref 12–46)
Lymphs Abs: 1.5 10*3/uL (ref 0.7–4.0)
MCH: 27.7 pg (ref 26.0–34.0)
MCHC: 33.4 g/dL (ref 30.0–36.0)
MCV: 82.9 fL (ref 78.0–100.0)
MPV: 9.7 fL (ref 8.6–12.4)
Monocytes Absolute: 0.4 10*3/uL (ref 0.1–1.0)
Monocytes Relative: 9 % (ref 3–12)
Neutro Abs: 2.4 10*3/uL (ref 1.7–7.7)
Neutrophils Relative %: 50 % (ref 43–77)
Platelets: 207 10*3/uL (ref 150–400)
RBC: 4.98 MIL/uL (ref 4.22–5.81)
RDW: 14.2 % (ref 11.5–15.5)
WBC: 4.8 10*3/uL (ref 4.0–10.5)

## 2014-05-14 LAB — POCT URINALYSIS DIPSTICK
Bilirubin, UA: NEGATIVE
Blood, UA: NEGATIVE
GLUCOSE UA: NEGATIVE
KETONES UA: NEGATIVE
LEUKOCYTES UA: NEGATIVE
Nitrite, UA: NEGATIVE
Protein, UA: NEGATIVE
Spec Grav, UA: 1.02
Urobilinogen, UA: NEGATIVE
pH, UA: 6

## 2014-05-14 LAB — LIPID PANEL
CHOL/HDL RATIO: 5.6 ratio
Cholesterol: 213 mg/dL — ABNORMAL HIGH (ref 0–200)
HDL: 38 mg/dL — AB (ref >=40)
LDL Cholesterol: 126 mg/dL — ABNORMAL HIGH (ref 0–99)
TRIGLYCERIDES: 244 mg/dL — AB (ref <150)
VLDL: 49 mg/dL — ABNORMAL HIGH (ref 0–40)

## 2014-05-14 LAB — COMPREHENSIVE METABOLIC PANEL
ALK PHOS: 57 U/L (ref 39–117)
ALT: 40 U/L (ref 0–53)
AST: 36 U/L (ref 0–37)
Albumin: 4.6 g/dL (ref 3.5–5.2)
BUN: 12 mg/dL (ref 6–23)
CALCIUM: 10 mg/dL (ref 8.4–10.5)
CHLORIDE: 101 meq/L (ref 96–112)
CO2: 27 mEq/L (ref 19–32)
Creat: 1.35 mg/dL (ref 0.50–1.35)
GLUCOSE: 120 mg/dL — AB (ref 70–99)
POTASSIUM: 4.2 meq/L (ref 3.5–5.3)
SODIUM: 141 meq/L (ref 135–145)
TOTAL PROTEIN: 7.7 g/dL (ref 6.0–8.3)
Total Bilirubin: 0.7 mg/dL (ref 0.2–1.2)

## 2014-05-14 LAB — URIC ACID: Uric Acid, Serum: 7.9 mg/dL — ABNORMAL HIGH (ref 4.0–7.8)

## 2014-05-14 LAB — TSH: TSH: 7.068 u[IU]/mL — ABNORMAL HIGH (ref 0.350–4.500)

## 2014-05-14 LAB — MAGNESIUM: MAGNESIUM: 1.6 mg/dL (ref 1.5–2.5)

## 2014-05-14 NOTE — Progress Notes (Signed)
Chief Complaint  Patient presents with  . Annual Exam    fasting annual exam-no concerns.     Gregory Friedman is a 64 y.o. male who presents for a complete physical.  He has no concerns.  He is fasting for bloodwork, and is scheduled for follow up on his chronic medical problems next week.  He reports that he never started the Jardiance (it was expensive when it came from mail order, but admits to never even starting the samples due to fear of side effects). He was also notified that his kombiglyze costs will be increasing.   Immunization History  Administered Date(s) Administered  . Influenza Split 11/24/2010, 12/06/2011  . Influenza,inj,Quad PF,36+ Mos 11/25/2012, 01/01/2014  . Pneumococcal Polysaccharide-23 10/21/2009  . Tdap 05/27/2009  has checked with [facility] in the past, and copay for zostavax was too costly.   He doesn't remember ever having chicken pox. Last colonoscopy: 01/2011, due again 2018 Last PSA: 03/2013 Dentist: twice yearly, went last month Ophtho: yearly usually, but admits he didn't go last year. Exercise: Walks 30 minutes 4-5x/week, or rides on exercise bike. No weight-bearing activity  He doesn't have a Living Will, healthcare power of attorney, but has the paperwork, and has discussed with his wife.  Past Medical History  Diagnosis Date  . DM (diabetes mellitus)     type II.   [name] GERD (gastroesophageal reflux disease)   . Hyperlipidemia   . Microscopic hematuria     referred to urology 2012  . Myocardial infarction 2003    with both PCI, and CABG (Dr. Dorris Carnes)  . BPH (benign prostatic hyperplasia)   . Arthritis   . Anxiety   . Depression   . Hypertension     sees Dr  Tera Helper. Chery Giusto  . Diverticulosis   . S/P radiation therapy     Received 8 fractions, then stopped due to mucositis with Radiation/ Cisplatin  . BPV (benign positional vertigo) 2012    improved after vestibular rehab  . Buccal mucosa squamous cell carcinoma dx'd 01/2012    xrt and  chemo comp 03/2012  . Serrated adenoma of colon     2013    Past Surgical History  Procedure Laterality Date  . Fracture surgery Right     RT TALAR REPAIR  . Cardiac catheterization  05/2010  . Cardiovascular stress test  07/2009  . Coronary angioplasty  2003; 2006    2 stents in heart  . Coronary artery bypass graft  01/2003    2 vessels  . Cystoscopy  02/09/2011    Procedure: CYSTOSCOPY;  Surgeon: Marissa Nestle, MD;  Location: [facility];  Service: Urology;  Laterality: N/A;  . Colonoscopy  01/2011    polyp; next due 2018.   . Radical neck dissection Left 03/13/2012    Procedure: RADICAL NECK DISSECTION;  Surgeon: Rozetta Nunnery, MD;  Location: [place];  Service: ENT;  Laterality: Left;  . Tooth extraction N/A 03/13/2012    Procedure: EXTRACTION MOLARS;  Surgeon: Rozetta Nunnery, MD;  Location: [place];  Service: ENT;  Laterality: N/A;  . Mass excision N/A 03/13/2012    Procedure: EXCISION LEFT BUCCAL MUCOSA;  Surgeon: Rozetta Nunnery, MD;  Location: [place];  Service: ENT;  Laterality: N/A;    History   Social History  . Marital Status: Married    Spouse Name: N/A  . Number of Children: 3  . Years of Education: N/A   Occupational History  . 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 Smoking status: Never Smoker   . Smokeless tobacco: Never Used  . Alcohol Use: No  . Drug Use: No  . Sexual Activity: Not on file   Other Topics Concern  . Not on file   Social History Narrative   Married;  2 children in [place], son in [place]; 6 grandchildren          Family History  Problem Relation Age of Onset  . Stroke Sister   . Anesthesia problems Neg Hx   . Hypotension Neg Hx   . Malignant hyperthermia Neg Hx   . Pseudochol deficiency Neg Hx   . Colon cancer Neg Hx   . Heart disease Neg Hx   . Diabetes Brother   . Seizures Mother   . Asthma Father   . Cancer Daughter     thyroid cancer  . Diabetes Sister   . Parkinson's disease Brother      Outpatient Encounter Prescriptions as of 05/14/2014  Medication Sig Note  . aspirin 81 MG tablet Take 81 mg by mouth daily.   . Blood Glucose Monitoring Suppl (ACCU-CHEK NANO SMARTVIEW) W/DEVICE KIT Check your blood sugars once daily   . enalapril (VASOTEC) 10 MG tablet TAKE 1 TABLET EVERY DAY   . glucose blood test strip Use as instructed   . Hyaluronic Acid-Vitamin C (HYALURONIC ACID PO) Take 80 mg by mouth daily. Takes 1 tablet daily   . Lancets MISC Please test blood sugars daily   . metoprolol succinate (TOPROL-XL) 25 MG 24 hr tablet TAKE 1 TABLET EVERY DAY   . omeprazole (PRILOSEC) 40 MG capsule TAKE 1 CAPSULE EVERY DAY   . OVER THE COUNTER MEDICATION Take 2 tablets by mouth daily.  03/04/2012: Brand name_URINOZINC, for prostate.   . Saw Palmetto, Serenoa repens, 320 MG CAPS Take 1 capsule by mouth daily.   . Saxagliptin-Metformin 2.05-998 MG TB24 Take 2 tablets by mouth daily.   . simvastatin (ZOCOR) 40 MG tablet TAKE 1 TABLET AT BEDTIME   . Vitamins C E (VITAMIN C & E COMPLEX) 500-400 MG-UNIT CAPS Take 1 capsule by mouth daily.   . empagliflozin (JARDIANCE) 10 MG TABS tablet Take 10 mg by mouth daily. (Patient not taking: Reported on 05/14/2014) 05/14/2014: Never started this--afraid of the side effects so never took samples; too costly for rx  . fluticasone (FLONASE) 50 MCG/ACT nasal spray Place 2 sprays into both nostrils daily. 05/14/2014: Not needing  . meclizine (MEDI-MECLIZINE) 25 MG tablet Take 0.5-1 tablets (12.5-25 mg total) by mouth 3 (three) times daily as needed for dizziness. (Patient not taking: Reported on 01/14/2014) 04/07/2013: Takes as needed for vertigo (rare)  . nitroGLYCERIN (NITROSTAT) 0.4 MG SL tablet Place 1 tablet (0.4 mg total) under the tongue every 5 (five) minutes as needed. Chest pain (Patient not taking: Reported on 01/14/2014) 04/07/2013: Doesn't need/not taking  . [DISCONTINUED] indomethacin (INDOCIN) 50 MG capsule Take 1 capsule (50 mg total) by mouth 3  (three) times daily with meals. (Patient not taking: Reported on 01/14/2014) 10/13/2013: Uses prn--not needing  . [DISCONTINUED] ondansetron (ZOFRAN) 8 MG tablet Take 8 mg by mouth as needed.  04/07/2013: Takes prn nausea   No Known Allergies  ROS: The patient denies anorexia, fever, weight changes, headaches, vision loss, decreased hearing, ear pain, hoarseness, chest pain, palpitations, dizziness, syncope, dyspnea on exertion, cough, swelling, nausea, vomiting, diarrhea, constipation, abdominal pain, melena, hematochezia, indigestion/heartburn, hematuria, incontinence, erectile dysfunction, nocturia (once or twice/night),  weakened urine stream, dysuria, genital lesions, joint pains, numbness, tingling, weakness, tremor, suspicious skin lesions, abnormal bruising, or enlarged lymph nodes. Sometimes feels itchy after a hot shower. Rare vertigo with certain positions (laying on his back), not needing meclizine. Chronic tinnitus, tolerable BPH symptoms have been improved since taking saw palmetto and urinozinc Denies depression ("I'm over that--I'm good now") 10# weight gain since last physical, but unchanged over the last 6 months. No further toe pain He feels something that after a bowel movement "slips back in"--he thinks he might have hemorrhoids. No bleeding, pain or itching  PHYSICAL EXAM:  BP 140/90 mmHg  Pulse 68  Ht 5' 9"  (1.753 m)  Wt 192 lb (87.091 kg)  BMI 28.34 kg/m2 164/92 on repeat by MD  General Appearance:   Alert, cooperative, no distress, appears stated age  Head:   Normocephalic, without obvious abnormality, atraumatic. He has healed nicely without significant deformity from his cancer surgery.  Eyes:   PERRL, conjunctiva/corneas clear, EOM's intact, fundi   benign  Ears:   Normal TM's and external ear canals  Nose:  Nares normal, mucosa pale, mildly edematous, no drainage or sinus tenderness  Throat:  Lips, mucosa, and tongue normal; teeth and gums  normal. Some "pulling" sensation on left when opening jaw  Neck:  Supple, no lymphadenopathy; thyroid: no enlargement/tenderness/nodules; no carotid  bruit or JVD  Back:  Spine nontender, no curvature, ROM normal, no CVA tenderness  Lungs:   Clear to auscultation bilaterally without wheezes, rales or ronchi; respirations unlabored  Chest Wall:   No tenderness or deformity. WHSS midline  Heart:   Regular rate and rhythm, S1 and S2 normal, no murmur, rub  or gallop  Breast Exam:   No chest wall tenderness, masses or gynecomastia  Abdomen:   Soft, non-tender, nondistended, normoactive bowel sounds,   no masses, no hepatosplenomegaly  Genitalia:   Normal male external genitalia without lesions.uncircumcized. Testicles without masses. No inguinal hernias.  Rectal:   Normal sphincter tone. Prostate smooth, moderately enlarged, no nodule or mass.There is a pedunculated, mobile mass that is palpable nontender.  Too mobile to suspect a hemorrhoid. Stool is heme negative.  Extremities:  No clubbing, cyanosis or edema  Pulses:  2+ and symmetric all extremities  Skin:  Skin color, texture, turgor normal, no rashes or lesions  Lymph nodes:  Cervical, supraclavicular, and axillary nodes normal  Neurologic:  CNII-XII intact, normal strength, sensation and gait; reflexes 2+ and symmetric throughout    Psych: Normal mood, affect, hygiene and grooming       Diabetic foot exam--normal. urinalysis normal   ASSESSMENT/PLAN:  Annual physical exam - Plan: POCT Urinalysis Dipstick, Visual acuity screening  Mixed hyperlipidemia  HYPERTENSION, BENIGN - BP elevated--low sodium diet; monitor BP and bring list next week  Depression, major, in remission  Type II diabetes mellitus, uncontrolled - Plan: Hemoglobin A1c, TSH, Microalbumin / creatinine urine ratio  Buccal mucosa squamous cell carcinoma - in remission, no  evidence of recurrence per recent check per pt  Gastroesophageal reflux disease, esophagitis presence not specified  Medication monitoring encounter - Plan: Magnesium, Lipid panel, Comprehensive metabolic panel, CBC with Differential/Platelet, Hemoglobin A1c, Uric Acid  Prostate cancer screening - Plan: PSA, Medicare  Rectal mass - will perform anoscopy when he returns next week to determine next step, depending on appearance (ie hemorrhoid or not)  Advanced care planning/counseling discussion   ANOSCOPY AT FU next week.  Recommended at least 30 minutes of aerobic activity at least 5 days/week;  proper sunscreen use reviewed; healthy diet and alcohol recommendations (less than or equal to 2 drinks/day) reviewed; regular seatbelt use; changing batteries in smoke detectors. Immunization recommendations discussed-continue yearly flu shots; prevnar 64 at age 71. Consider zostavax if/when affordable. Colonoscopy recommendations reviewed--due again 2018  Shingles vaccine recommended--he states that the copay was too expensive  Encouraged to schedule diabetic eye exam (and when scheduled, call us for referral).  Discussed end of life issues--full code, full care. He doesn't want extended measures. He has Living Will and Healthcare power of attorney information and forms, and he was encouraged to fill these out, get notarized, and give [place] a copy. If he can't find them, he can call to be mailed more.

## 2014-05-14 NOTE — Patient Instructions (Addendum)
  HEALTH MAINTENANCE RECOMMENDATIONS:  It is recommended that you get at least 30 minutes of aerobic exercise at least 5 days/week (for weight loss, you may need as much as 60-90 minutes). This can be any activity that gets your heart rate up. This can be divided in 10-15 minute intervals if needed, but try and build up your endurance at least once a week.  Weight bearing exercise is also recommended twice weekly.  Eat a healthy diet with lots of vegetables, fruits and fiber.  "Colorful" foods have a lot of vitamins (ie green vegetables, tomatoes, red peppers, etc).  Limit sweet tea, regular sodas and alcoholic beverages, all of which has a lot of calories and sugar.  Up to 2 alcoholic drinks daily may be beneficial for men (unless trying to lose weight, watch sugars).  Drink a lot of water.  Sunscreen of at least SPF 30 should be used on all sun-exposed parts of the skin when outside between the hours of 10 am and 4 pm (not just when at beach or pool, but even with exercise, golf, tennis, and yard work!)  Use a sunscreen that says "broad spectrum" so it covers both UVA and UVB rays, and make sure to reapply every 1-2 hours.  Remember to change the batteries in your smoke detectors when changing your clock times in the spring and fall.  Use your seat belt every time you are in a car, and please drive safely and not be distracted with cell phones and texting while driving.    Please schedule your yearly diabetic eye exam (and make sure they send [place] a note)--call us as soon as you make the appointment so that we can do the referral (or they might turn you way at your appointment!)  Please fill out the Living Will and healthcare power of attorney paperwork you were given last year--if you cannot locate them, call us and we can mail you more.  Once notarized, send [place] a copy.  Consider shingles vaccine (if/when your portion of cost is affordable). Check with your insurance to see which diabetes  medications are preferred (bring a list if you can to your next visit).  Please check your blood pressure at least a few times before your visit next week, and bring the list (and write down any prior BP's that you have been checking) and bring the list to your visit next week.  When you come back next week, we will be doing an anoscopy

## 2014-05-15 LAB — MICROALBUMIN / CREATININE URINE RATIO
CREATININE, URINE: 143.5 mg/dL
MICROALB/CREAT RATIO: 1.4 mg/g (ref 0.0–30.0)
Microalb, Ur: 0.2 mg/dL (ref <2.0)

## 2014-05-15 LAB — HEMOGLOBIN A1C
HEMOGLOBIN A1C: 7.2 % — AB (ref <5.7)
Mean Plasma Glucose: 160 mg/dL — ABNORMAL HIGH (ref <117)

## 2014-05-15 LAB — PSA, MEDICARE: PSA: 1.2 ng/mL (ref <=4.00)

## 2014-05-21 ENCOUNTER — Ambulatory Visit (INDEPENDENT_AMBULATORY_CARE_PROVIDER_SITE_OTHER): Payer: Commercial Managed Care - HMO | Admitting: Family Medicine

## 2014-05-21 ENCOUNTER — Encounter: Payer: Self-pay | Admitting: Family Medicine

## 2014-05-21 VITALS — BP 120/78 | HR 60 | Ht 69.0 in | Wt 187.2 lb

## 2014-05-21 DIAGNOSIS — K648 Other hemorrhoids: Secondary | ICD-10-CM

## 2014-05-21 DIAGNOSIS — I10 Essential (primary) hypertension: Secondary | ICD-10-CM | POA: Diagnosis not present

## 2014-05-21 DIAGNOSIS — E039 Hypothyroidism, unspecified: Secondary | ICD-10-CM

## 2014-05-21 DIAGNOSIS — E119 Type 2 diabetes mellitus without complications: Secondary | ICD-10-CM | POA: Insufficient documentation

## 2014-05-21 DIAGNOSIS — M109 Gout, unspecified: Secondary | ICD-10-CM

## 2014-05-21 DIAGNOSIS — M10079 Idiopathic gout, unspecified ankle and foot: Secondary | ICD-10-CM

## 2014-05-21 DIAGNOSIS — R198 Other specified symptoms and signs involving the digestive system and abdomen: Secondary | ICD-10-CM | POA: Diagnosis not present

## 2014-05-21 DIAGNOSIS — K6289 Other specified diseases of anus and rectum: Secondary | ICD-10-CM

## 2014-05-21 DIAGNOSIS — F324 Major depressive disorder, single episode, in partial remission: Secondary | ICD-10-CM | POA: Diagnosis not present

## 2014-05-21 DIAGNOSIS — E782 Mixed hyperlipidemia: Secondary | ICD-10-CM

## 2014-05-21 DIAGNOSIS — F325 Major depressive disorder, single episode, in full remission: Secondary | ICD-10-CM

## 2014-05-21 MED ORDER — EMPAGLIFLOZIN 10 MG PO TABS: 10.0000 mg | ORAL_TABLET | Freq: Every day | ORAL | Status: DC

## 2014-05-21 NOTE — Progress Notes (Signed)
Chief Complaint  Patient presents with  . Diabetes    med check and anal exam.    Diabetes:  Since his visit last week, he started the Jardiance samples once daily, and also admits to cutting back on the kombiglyze to just once daily (to make them last longer).  He denies any side effects (no urinary symptoms) and reports that his numbers look good.  Fasting sugars in the last week range from 92-109.  He hasn't been checking later in the day recently. He lost 5 pounds in the last week since starting the Jardiance.  Hypertension follow-up: Blood pressures elsewhere run 123-144/70-80 in the last week, at home. Denies dizziness, headaches, chest pain. Denies side effects of medications. He is trying to limit salt in his diet.    Gout--had a flare in his right great toe in 05/2013.  Denies any problems since then.  Labs were reviewed--elevated TSH.  He denies any hair/skin/bowel/mood changes, energy is good.  Hyperlipidemia:  Reports compliance with lowfat, low cholesterol diet, and compliant with taking his simvastatin as directed.  PMH, PSH, SH reviewed  Outpatient Encounter Prescriptions as of 05/21/2014  Medication Sig Note  . aspirin 81 MG tablet Take 81 mg by mouth daily.   . Blood Glucose Monitoring Suppl (ACCU-CHEK NANO SMARTVIEW) W/DEVICE KIT Check your blood sugars once daily   . empagliflozin (JARDIANCE) 10 MG TABS tablet Take 10 mg by mouth daily.   . enalapril (VASOTEC) 10 MG tablet TAKE 1 TABLET EVERY DAY   . fluticasone (FLONASE) 50 MCG/ACT nasal spray Place 2 sprays into both nostrils daily. 05/14/2014: Not needing  . glucose blood test strip Use as instructed   . Hyaluronic Acid-Vitamin C (HYALURONIC ACID PO) Take 80 mg by mouth daily. Takes 1 tablet daily   . Lancets MISC Please test blood sugars daily   . meclizine (MEDI-MECLIZINE) 25 MG tablet Take 0.5-1 tablets (12.5-25 mg total) by mouth 3 (three) times daily as needed for dizziness. 04/07/2013: Takes as needed for vertigo  (rare)  . metoprolol succinate (TOPROL-XL) 25 MG 24 hr tablet TAKE 1 TABLET EVERY DAY   . nitroGLYCERIN (NITROSTAT) 0.4 MG SL tablet Place 1 tablet (0.4 mg total) under the tongue every 5 (five) minutes as needed. Chest pain (Patient not taking: Reported on 05/21/2014) 04/07/2013: Doesn't need/not taking  . omeprazole (PRILOSEC) 40 MG capsule TAKE 1 CAPSULE EVERY DAY   . OVER THE COUNTER MEDICATION Take 2 tablets by mouth daily.  03/04/2012: Brand name_URINOZINC, for prostate.   . Saw Palmetto, Serenoa repens, 320 MG CAPS Take 1 capsule by mouth daily.   . Saxagliptin-Metformin 2.05-998 MG TB24 Take 2 tablets by mouth daily. 05/21/2014: Cut back to 1 a day after starting Jardiance, to stretch them out ($$ purposes)  . simvastatin (ZOCOR) 40 MG tablet TAKE 1 TABLET AT BEDTIME   . Vitamins C E (VITAMIN C & E COMPLEX) 500-400 MG-UNIT CAPS Take 1 capsule by mouth daily.   . [DISCONTINUED] empagliflozin (JARDIANCE) 10 MG TABS tablet Take 10 mg by mouth daily. 05/21/2014: Started samples after visit last week   No Known Allergies  ROS:  No headaches, dizziness, chest pain, URI symptoms, cough, shortness of breath, GI or GU complaints. No rectal bleeding, no joint pains. Normal moods. No numbness, tingling, weakness or other concerns.  PHYSICAL EXAM: BP 120/78 mmHg  Pulse 60  Ht 5' 9" (1.753 m)  Wt 187 lb 3.2 oz (84.913 kg)  BMI 27.63 kg/m2 Well developed, pleasant male in no  distress Complete physical done last week  Anoscopy: performed without complication.   Findings notable for Internal hemorrhoid, nonbleeding  Labs: Normal urine dip  Lab Results  Component Value Date   CHOL 213* 05/14/2014   HDL 38* 05/14/2014   LDLCALC 126* 05/14/2014   TRIG 244* 05/14/2014   CHOLHDL 5.6 05/14/2014     Chemistry      Component Value Date/Time   NA 141 05/14/2014 0001   NA 141 05/20/2013 1058   K 4.2 05/14/2014 0001   K 3.9 05/20/2013 1058   CL 101 05/14/2014 0001   CL 102 05/15/2012 0917   CO2  27 05/14/2014 0001   CO2 29 05/20/2013 1058   BUN 12 05/14/2014 0001   BUN 8.9 05/20/2013 1058   CREATININE 1.35 05/14/2014 0001   CREATININE 1.4* 05/20/2013 1058   CREATININE 1.11 04/10/2012 0740      Component Value Date/Time   CALCIUM 10.0 05/14/2014 0001   CALCIUM 10.2 05/20/2013 1058   ALKPHOS 57 05/14/2014 0001   ALKPHOS 88 05/20/2013 1058   AST 36 05/14/2014 0001   AST 28 05/20/2013 1058   ALT 40 05/14/2014 0001   ALT 26 05/20/2013 1058   BILITOT 0.7 05/14/2014 0001   BILITOT 0.41 05/20/2013 1058     Glucose 120  Lab Results  Component Value Date   HGBA1C 7.2* 05/14/2014   Mg 1.6  Lab Results  Component Value Date   WBC 4.8 05/14/2014   HGB 13.8 05/14/2014   HCT 41.3 05/14/2014   MCV 82.9 05/14/2014   PLT 207 05/14/2014   Lab Results  Component Value Date   TSH 7.068* 05/14/2014   Normal urine microalbumin/Cr ratio  Lab Results  Component Value Date   PSA 1.20 05/14/2014   PSA 1.08 04/04/2013   PSA 0.58 08/22/2010   Uric acid 7.9  ASSESSMENT/PLAN:  Mixed hyperlipidemia - not at goal; reportedly compliant with diet and medications; may be related to hypothyroidism  HYPERTENSION, BENIGN - controlled. normal here, slightly higher at home.  continue to monitor; continue current medication  Depression, major, in remission - no recurrence, even with new hypothyroidism.  Type 2 diabetes mellitus not at goal - on his own, he started Jardiance (as previously recommended), but also decreased kombiglyze dose. monitor glu BID, and increase if sugars above goal. rechk 3 mo - Plan: empagliflozin (JARDIANCE) 10 MG TABS tablet  Hypothyroidism, unspecified hypothyroidism type - elevated TSH, previously normal.  no symptoms. prefers to hold off on starting medication.  recheck in 3 mos (with Ab's).  Start meds if persistently elevated  Gout of foot, unspecified cause, unspecified chronicity, unspecified laterality - elevated uric acid level, but no recurrent pain  in almost a year.  reviewed diet; hold off on meds for now given lack of flares  Rectal mass - anoscopy revealed internal hemorrhoid, no other mass - Plan: PR DIAGNOSTIC ANOSCOPY  Internal hemorrhoid  Gout--since no flares in almost a year, declines starting allopurinol to lower uric acid levels.  Discussed low purine diet and where to find info--dietary measures should help keep this in check for him.  DM--he prefers to try and stay on the 1 kombiglyze daily rather than two--more cost effective, and numbers have been okay.  But he hasn't been checking evening sugars.  Check post-prandial sugars, and if they still look okay, stay on current regimen.  If sugars rise, then go back up to [place] 2/day.  HTN--some elevated numbers  This week at home, but overall okay.  Normal here today. Continue current regimen.  Hypothyroidism:  Denies any symptoms.  Given that this is the first time abnormal, and lack of symptoms, he prefers to recheck; if persistently elevated, especially if lipids remain affected, then will need to start thyroid medication next visit. Currently lipids would be the main reason to start treatment.  His lipids are above goal, without significant dietary changes, and reported compliance with his statin. Continue the simvastatin and low cholesterol diet. Recheck both thyroid and lipids in 3 months (along with A1c,)  Restart fish oil.  F/u 3 months with labs prior

## 2014-05-21 NOTE — Patient Instructions (Addendum)
Your uric acid level was high.  If you have any further gout flares, we should put you on a preventative medication.  I don't think we need to at this point, since you haven't had pain in almost a year.  But please try and follow the proper diet to avoid flares (look at [place] about gout on the internet).  I truly don't know if 1 pill of kombiglyze along with the Jardiance will be enough to get you to goal, but I am willing to give you a chance.  If you see your fasting sugars be consistently >140, or your sugars 2 hours after eating being consistently >160's, then you should go back to taking 2 kombiglyze daily.  Your thyroid was underactive.  This might be affecting your cholesterol.  Try and follow a low cholesterol diet (as you have been), and take your simvastatin daily. In 3 months we will recheck the thyroid and if still elevated, might need to start you on thyroid medication (which would be needed long-term).  Consider restarting fish oil (omega-3)--this should help lower the triglycerides (along with a lowfat diet).  If you develop thyroid symptoms prior to you next visit, let us know.  Hypothyroidism The thyroid is a large gland located in the lower front of your neck. The thyroid gland helps control metabolism. Metabolism is how your body handles food. It controls metabolism with the hormone thyroxine. When this gland is underactive (hypothyroid), it produces too little hormone.  CAUSES These include:   Absence or destruction of thyroid tissue.  Goiter due to iodine deficiency.  Goiter due to medications.  Congenital defects (since birth).  Problems with the pituitary. This causes a lack of TSH (thyroid stimulating hormone). This hormone tells the thyroid to turn out more hormone. SYMPTOMS  Lethargy (feeling as though you have no energy)  Cold intolerance  Weight gain (in spite of normal food intake)  Dry skin  Coarse hair  Menstrual irregularity (if severe,  may lead to infertility)  Slowing of thought processes Cardiac problems are also caused by insufficient amounts of thyroid hormone. Hypothyroidism in the newborn is cretinism, and is an extreme form. It is important that this form be treated adequately and immediately or it will lead rapidly to retarded physical and mental development. DIAGNOSIS  To prove hypothyroidism, your caregiver may do blood tests and ultrasound tests. Sometimes the signs are hidden. It may be necessary for your caregiver to watch this illness with blood tests either before or after diagnosis and treatment. TREATMENT  Low levels of thyroid hormone are increased by using synthetic thyroid hormone. This is a safe, effective treatment. It usually takes about four weeks to gain the full effects of the medication. After you have the full effect of the medication, it will generally take another four weeks for problems to leave. Your caregiver may start you on low doses. If you have had heart problems the dose may be gradually increased. It is generally not an emergency to get rapidly to normal. HOME CARE INSTRUCTIONS   Take your medications as your caregiver suggests. Let your caregiver know of any medications you are taking or start taking. Your caregiver will help you with dosage schedules.  As your condition improves, your dosage needs may increase. It will be necessary to have continuing blood tests as suggested by your caregiver.  Report all suspected medication side effects to your caregiver. SEEK MEDICAL CARE IF: Seek medical care if you develop:  Sweating.  Tremulousness (  tremors).  Anxiety.  Rapid weight loss.  Heat intolerance.  Emotional swings.  Diarrhea.  Weakness. SEEK IMMEDIATE MEDICAL CARE IF:  You develop chest pain, an irregular heart beat (palpitations), or a rapid heart beat. MAKE SURE YOU:   Understand these instructions.  Will watch your condition.  Will get help right away if you are  not doing well or get worse. Document Released: 01/09/2005 Document Revised: 04/03/2011 Document Reviewed: 08/30/2007 [facility] Patient Information 2015 [place], [place]. This information is not intended to replace advice given to you by your health care provider. Make sure you discuss any questions you have with your health care provider.

## 2014-06-23 DIAGNOSIS — H43393 Other vitreous opacities, bilateral: Secondary | ICD-10-CM | POA: Diagnosis not present

## 2014-06-23 DIAGNOSIS — H40013 Open angle with borderline findings, low risk, bilateral: Secondary | ICD-10-CM | POA: Diagnosis not present

## 2014-06-23 DIAGNOSIS — E119 Type 2 diabetes mellitus without complications: Secondary | ICD-10-CM | POA: Diagnosis not present

## 2014-06-23 LAB — HM DIABETES EYE EXAM

## 2014-06-29 ENCOUNTER — Encounter: Payer: Self-pay | Admitting: Internal Medicine

## 2014-08-05 ENCOUNTER — Other Ambulatory Visit: Payer: Self-pay | Admitting: Family Medicine

## 2014-08-17 ENCOUNTER — Other Ambulatory Visit: Payer: Commercial Managed Care - HMO

## 2014-08-17 DIAGNOSIS — E119 Type 2 diabetes mellitus without complications: Secondary | ICD-10-CM | POA: Diagnosis not present

## 2014-08-17 DIAGNOSIS — E782 Mixed hyperlipidemia: Secondary | ICD-10-CM | POA: Diagnosis not present

## 2014-08-17 DIAGNOSIS — E039 Hypothyroidism, unspecified: Secondary | ICD-10-CM | POA: Diagnosis not present

## 2014-08-18 LAB — HEMOGLOBIN A1C
Hgb A1c MFr Bld: 7.2 % — ABNORMAL HIGH (ref <5.7)
MEAN PLASMA GLUCOSE: 160 mg/dL — AB (ref <117)

## 2014-08-18 LAB — LIPID PANEL
Cholesterol: 114 mg/dL — ABNORMAL LOW (ref 125–200)
HDL: 40 mg/dL (ref >=40)
LDL Cholesterol: 48 mg/dL (ref <130)
Total CHOL/HDL Ratio: 2.9 Ratio (ref <=5.0)
Triglycerides: 131 mg/dL (ref <150)
VLDL: 26 mg/dL (ref <30)

## 2014-08-18 LAB — THYROID PEROXIDASE ANTIBODY: Thyroperoxidase Ab SerPl-aCnc: 2 IU/mL (ref <9)

## 2014-08-18 LAB — GLUCOSE, RANDOM: GLUCOSE: 109 mg/dL — AB (ref 65–99)

## 2014-08-18 LAB — TSH: TSH: 4.116 u[IU]/mL (ref 0.350–4.500)

## 2014-08-20 ENCOUNTER — Encounter: Payer: Self-pay | Admitting: Family Medicine

## 2014-08-20 ENCOUNTER — Ambulatory Visit (INDEPENDENT_AMBULATORY_CARE_PROVIDER_SITE_OTHER): Payer: Commercial Managed Care - HMO | Admitting: Family Medicine

## 2014-08-20 VITALS — BP 138/84 | HR 64 | Ht 69.0 in | Wt 185.0 lb

## 2014-08-20 DIAGNOSIS — E782 Mixed hyperlipidemia: Secondary | ICD-10-CM

## 2014-08-20 DIAGNOSIS — F324 Major depressive disorder, single episode, in partial remission: Secondary | ICD-10-CM | POA: Diagnosis not present

## 2014-08-20 DIAGNOSIS — I10 Essential (primary) hypertension: Secondary | ICD-10-CM

## 2014-08-20 DIAGNOSIS — F325 Major depressive disorder, single episode, in full remission: Secondary | ICD-10-CM

## 2014-08-20 DIAGNOSIS — E119 Type 2 diabetes mellitus without complications: Secondary | ICD-10-CM

## 2014-08-20 MED ORDER — SIMVASTATIN 40 MG PO TABS: 40.0000 mg | ORAL_TABLET | Freq: Every day | ORAL | Status: DC

## 2014-08-20 MED ORDER — METOPROLOL SUCCINATE ER 25 MG PO TB24: 25.0000 mg | ORAL_TABLET | Freq: Every day | ORAL | Status: DC

## 2014-08-20 MED ORDER — ENALAPRIL MALEATE 10 MG PO TABS: 10.0000 mg | ORAL_TABLET | Freq: Every day | ORAL | Status: DC

## 2014-08-20 NOTE — Progress Notes (Signed)
Chief Complaint  Patient presents with  . Diabetes    nonfasting blood work. Labs already done.    Patient presents for follow up on DM.  Sugars have been running 94-110 in the mornings.  He doesn't check it other times of the day.  He is taking Jardiance without side effects.  He still has only been taking the Kombiglyze once daily (for cost purposes, no side effects).  He has been eating a lot of watermelon recently. He had his eye exam in May, and had no retinopathy.  Hypertension follow-up: Blood pressures haven't been checked elsewhere recently. Denies dizziness, headaches, chest pain. Denies side effects of medications. He is trying to limit salt in his diet.He no longer is having any vertigo, and tinnitus has significantly improved.  Hyperlipidemia: Reports compliance with lowfat, low cholesterol diet, and compliant with taking his simvastatin as directed.  Depression:  In remission, off medication.  He recently lost his brother at the age of 88.  He had been in the hospital for the last 3 weeks, thinks death was related to his heart.  He last visited him in November.  Moods are doing okay, some sadness as expected.  He overall has been sleeping better, and in good spirits.  Hypothyroidism:  Diagnosed 3 months ago with TSH 7.068. He was asymptomatic; meds were not started, but instead planned to recheck in 3 months with antibodies.  He continues to not have any thyroid-related symptoms.  PMH, PSH, SH and FH were reviewed and updated.  Outpatient Encounter Prescriptions as of 08/20/2014  Medication Sig Note  . aspirin 81 MG tablet Take 81 mg by mouth daily.   . Blood Glucose Monitoring Suppl (ACCU-CHEK NANO SMARTVIEW) W/DEVICE KIT Check your blood sugars once daily   . empagliflozin (JARDIANCE) 10 MG TABS tablet Take 10 mg by mouth daily.   . enalapril (VASOTEC) 10 MG tablet Take 1 tablet (10 mg total) by mouth daily.   [name] glucose blood test strip Use as instructed   . Hyaluronic  Acid-Vitamin C (HYALURONIC ACID PO) Take 80 mg by mouth daily. Takes 1 tablet daily   . Lancets MISC Please test blood sugars daily   . metoprolol succinate (TOPROL-XL) 25 MG 24 hr tablet Take 1 tablet (25 mg total) by mouth daily.   [name] omeprazole (PRILOSEC) 40 MG capsule TAKE 1 CAPSULE EVERY DAY   . OVER THE COUNTER MEDICATION Take 2 tablets by mouth daily.  03/04/2012: Brand name_URINOZINC, for prostate.   . Saw Palmetto, Serenoa repens, 320 MG CAPS Take 1 capsule by mouth daily.   . Saxagliptin-Metformin 2.05-998 MG TB24 Take 2 tablets by mouth daily. (Patient taking differently: Take 1 tablet by mouth daily. ) 05/21/2014: Cut back to 1 a day after starting Jardiance, to stretch them out ($$ purposes)  . simvastatin (ZOCOR) 40 MG tablet Take 1 tablet (40 mg total) by mouth at bedtime.   . Vitamins C E (VITAMIN C & E COMPLEX) 500-400 MG-UNIT CAPS Take 1 capsule by mouth daily.   . [DISCONTINUED] enalapril (VASOTEC) 10 MG tablet TAKE 1 TABLET EVERY DAY   . [DISCONTINUED] metoprolol succinate (TOPROL-XL) 25 MG 24 hr tablet TAKE 1 TABLET EVERY DAY   . [DISCONTINUED] simvastatin (ZOCOR) 40 MG tablet TAKE 1 TABLET AT BEDTIME   . fluticasone (FLONASE) 50 MCG/ACT nasal spray Place 2 sprays into both nostrils daily. 05/14/2014: Not needing  . meclizine (MEDI-MECLIZINE) 25 MG tablet Take 0.5-1 tablets (12.5-25 mg total) by mouth 3 (three) times daily  as needed for dizziness. (Patient not taking: Reported on 08/20/2014) 04/07/2013: Takes as needed for vertigo (rare)  . nitroGLYCERIN (NITROSTAT) 0.4 MG SL tablet Place 1 tablet (0.4 mg total) under the tongue every 5 (five) minutes as needed. Chest pain (Patient not taking: Reported on 05/21/2014) 04/07/2013: Doesn't need/not taking   No facility-administered encounter medications on file as of 08/20/2014.   No Known Allergies   ROS: No headaches, dizziness, chest pain, URI symptoms, cough, shortness of breath, GI or GU complaints. No rectal bleeding, no joint  pains. Normal moods. No numbness, tingling, weakness or other concerns. See HPI.  PHYSICAL EXAM: BP 150/90 mmHg  Pulse 64  Ht _0  (1.753 m)  Wt 185 lb (83.915 kg)  BMI 27.31 kg/m2  138/84 on repeat by MD; patient appeared mildly anxious. HEENT: PERRL, EOMI, conjunctiva clear, OP clear Neck: no lymphadenopathy, thyromegaly or carotid bruit Heart: regular rate and rhythm Lungs: clear bilaterally Back: no CVA tenderness Abdomen: soft, nontender, no organomegaly or mass Extremities: no edema, normal pulses Psych: normal mood, affect, hygiene, grooming, speech, eye contact.  Lab Results  Component Value Date   CHOL 114* 08/17/2014   HDL 40 08/17/2014   LDLCALC 48 08/17/2014   TRIG 131 08/17/2014   CHOLHDL 2.9 08/17/2014   Lab Results  Component Value Date   TSH 4.116 08/17/2014  TPO Ab negative (2)  Fasting glucose 109  Lab Results  Component Value Date   HGBA1C 7.2* 08/17/2014  (unchanged from 3 months ago).  ASSESSMENT/PLAN:  Mixed hyperlipidemia - significantly improved. continue diet, current meds. - Plan: simvastatin (ZOCOR) 40 MG tablet  HYPERTENSION, BENIGN - elevated today; normal previously. +recent death in family and had been upset. monitor regularly. low sodium diet. continue meds - Plan: enalapril (VASOTEC) 10 MG tablet, metoprolol succinate (TOPROL-XL) 25 MG 24 hr tablet  Depression, major, in remission  Type 2 diabetes mellitus not at goal - Pt with CAD; goal A1c <7. resume kombiglyze 2/day, continue Jardiance. Check postrpandial sugars. decrease fruit intake   Check your sugars less often first thing in the morning (as these are all good), and start checking more often 2 hours after a meal or at bedtime.  These numbers ideally should be les than 140, but up to 160 is okay.  (versus morning sugars should be less than 120).  Continue your current medications.  The only change is that I want you to increase the Kombiglyze to 2 pills daily.  This is what  the bottle says.  You can take them both together, but if it bothers your stomach, feel free to separate them and take it twice daily. If you cannot tolerate taking 2 kombiglyze daily (either together or separate), then call us--you can drop back to once daily if we instead increase the Jardiance dose to 63m daily.  Please start periodically checking your blood pressures.  It was a little higher than your normal today.  Goal is <130/80.  Provided some grief counseling. Overall doing okay.

## 2014-08-20 NOTE — Patient Instructions (Signed)
  Check your sugars less often first thing in the morning (as these are all good), and start checking more often 2 hours after a meal or at bedtime.  These numbers ideally should be les than 140, but up to 160 is okay.  (versus morning sugars should be less than 120).  Continue your current medications. The only change is that I want you to increase the Kombiglyze to 2 pills daily.  This is what the bottle says.  You can take them both together, but if it bothers your stomach, feel free to separate them and take it twice daily. If you cannot tolerate taking 2 kombiglyze daily (either together or separate), then call us--you can drop back to once daily if we instead increase the Jardiance dose to 25mg  daily.  Please start periodically checking your blood pressures.  It was a little higher than your normal today.  Goal is <130/80.

## 2014-09-14 ENCOUNTER — Other Ambulatory Visit: Payer: Self-pay | Admitting: *Deleted

## 2014-09-14 DIAGNOSIS — E119 Type 2 diabetes mellitus without complications: Secondary | ICD-10-CM

## 2014-09-14 MED ORDER — SAXAGLIPTIN-METFORMIN ER 2.5-1000 MG PO TB24: 2.0000 | ORAL_TABLET | Freq: Every day | ORAL | Status: DC

## 2014-09-15 DIAGNOSIS — J029 Acute pharyngitis, unspecified: Secondary | ICD-10-CM | POA: Diagnosis not present

## 2014-11-17 ENCOUNTER — Other Ambulatory Visit (INDEPENDENT_AMBULATORY_CARE_PROVIDER_SITE_OTHER): Payer: Medicare Other

## 2014-11-17 DIAGNOSIS — Z23 Encounter for immunization: Secondary | ICD-10-CM | POA: Diagnosis not present

## 2014-11-17 DIAGNOSIS — E119 Type 2 diabetes mellitus without complications: Secondary | ICD-10-CM

## 2014-11-17 MED ORDER — SAXAGLIPTIN-METFORMIN ER 2.5-1000 MG PO TB24: 2.0000 | ORAL_TABLET | Freq: Every day | ORAL | Status: DC

## 2014-11-17 NOTE — Progress Notes (Unsigned)
Pt came in for a flu shot and wanted samples of kombgylize. Gave him 4 boxes with 12 tablets each in it as he takes it 2 tablets daily

## 2014-11-30 ENCOUNTER — Other Ambulatory Visit: Payer: Self-pay | Admitting: *Deleted

## 2014-11-30 DIAGNOSIS — E119 Type 2 diabetes mellitus without complications: Secondary | ICD-10-CM

## 2014-11-30 MED ORDER — SAXAGLIPTIN-METFORMIN ER 2.5-1000 MG PO TB24: 2.0000 | ORAL_TABLET | Freq: Every day | ORAL | Status: DC

## 2014-11-30 MED ORDER — EMPAGLIFLOZIN 10 MG PO TABS: 10.0000 mg | ORAL_TABLET | Freq: Every day | ORAL | Status: DC

## 2014-12-16 ENCOUNTER — Telehealth: Payer: Self-pay | Admitting: Family Medicine

## 2014-12-23 NOTE — Telephone Encounter (Signed)
To what--what are my choices?  The paper that I saw had to do with trying to switch the tier (to lower the copay), not that med wasn't actually covered.  Are there meds at a lower tier??

## 2014-12-23 NOTE — Telephone Encounter (Signed)
P.A. Isabelle Course, do you want to switch?

## 2014-12-25 NOTE — Telephone Encounter (Signed)
The fax didn't give any covered options.   I called [employer] t# [phone] covered options are generics are Alogliptin or Metformin  Brand alternatives are  Janumet XR Tier 3 Jentadueto Tier 3 Kazano Tier 4 Kombiglyze XR 5/500 Tier 4 (but max dosage 1 qd)

## 2014-12-27 NOTE — Telephone Encounter (Signed)
alogliptin is [place]--we could switch to this, with metformin separately.  It might be cheaper for him to be on 2 separate generics (kazano is the combo of the two drugs, but is tier 4).  Would be 25mg  of alogliptin daily for max dose, and 2000mg  of metformin daily--my concern will be with compliance with more pills.  He has appt 12/5--will address at visit.

## 2014-12-28 ENCOUNTER — Encounter: Payer: Commercial Managed Care - HMO | Admitting: Family Medicine

## 2014-12-30 NOTE — Telephone Encounter (Signed)
It appears that he canceled his 12/5 appt, and hasn't rescheduled.  I was going to discuss his meds with him.  He needs to r/s med check

## 2014-12-31 NOTE — Telephone Encounter (Signed)
R/s med check for 01/07/15.

## 2015-01-07 ENCOUNTER — Encounter: Payer: Self-pay | Admitting: Family Medicine

## 2015-01-07 ENCOUNTER — Ambulatory Visit (INDEPENDENT_AMBULATORY_CARE_PROVIDER_SITE_OTHER): Payer: Medicare Other | Admitting: Family Medicine

## 2015-01-07 VITALS — BP 130/88 | HR 60 | Ht 69.0 in | Wt 185.4 lb

## 2015-01-07 DIAGNOSIS — E782 Mixed hyperlipidemia: Secondary | ICD-10-CM

## 2015-01-07 DIAGNOSIS — E119 Type 2 diabetes mellitus without complications: Secondary | ICD-10-CM

## 2015-01-07 DIAGNOSIS — Z23 Encounter for immunization: Secondary | ICD-10-CM | POA: Diagnosis not present

## 2015-01-07 DIAGNOSIS — I10 Essential (primary) hypertension: Secondary | ICD-10-CM

## 2015-01-07 LAB — POCT GLYCOSYLATED HEMOGLOBIN (HGB A1C): Hemoglobin A1C: 7

## 2015-01-07 MED ORDER — METFORMIN HCL ER 500 MG PO TB24: 1000.0000 mg | ORAL_TABLET | Freq: Every day | ORAL | Status: DC

## 2015-01-07 MED ORDER — ALOGLIPTIN BENZOATE 25 MG PO TABS: 1.0000 | ORAL_TABLET | Freq: Every day | ORAL | Status: DC

## 2015-01-07 NOTE — Patient Instructions (Signed)
After you finish up your supply of Kombiglyze (taking just one tablet daily, as you have been), then you will change over to: 1 tablet of alogliptin daily, along with 2 tablets of Metformin.  You can take all 3 of these tablets together in the morning, to replace the kombiglyze. You should continue the daily jardiance.  Continue the 40mg  of omeprazole.  Should you want to try the lower dose, you might want to try Prilosec OTC for a week or two first, to see if it is effective at that dose, and then we can see if your insurance covers the lower dose.  If it isn't effective at the lower dose, continue the 40mg  longterm.  Continue to periodically monitor your blood pressure and sugars, and return if they are out of the expected range (sugars consistently >140-150 in the mornings, or BP >135/85 consistently).

## 2015-01-07 NOTE — Progress Notes (Signed)
Chief Complaint  Patient presents with  . Diabetes    nonfasting med check. Needs to discuss diabetic medication changes due to insurance and also would like to decrease omeprazole from 40 to 91m.    Patient presents for follow up on DM. Sugars have been running 95-115 in the mornings. He doesn't check it other times of the day. He is taking Jardiance without side effects. He still has only been taking the Kombiglyze once daily--he was supposed to increase to 2/day after his last visit when A1c still over 7.  He continued at 1/day for cost purposes, no side effects; insurance will not cover 2/day, and is still too costly to continue on this medication once daily--wants to change to less expensive option--see phone calls. His eye exam is UTD, and scheduled for next year. See phone call--less expensive to change to generic alogliptin with separate metformin than to continue with Kombiglyze even just once daily. He cut back on his fruit intake since his last visit.  Hypertension follow-up: 132/80 when recently checked at home.  Denies dizziness, headaches, chest pain. Denies side effects of medications. He is trying to limit salt in his diet, hasn't been as good as in the past.  Hyperlipidemia: Reports compliance with lowfat, low cholesterol diet, and compliant with taking his simvastatin as directed.  Depression: In remission, off medication.  GERD:  He is asking about decreasing the omeprazole dose from 448mto 2067m He tried taking it every other day but had recurrent symptoms on the day he didn't take any.  Wondering about taking a lower dose, vs discussing what the risks of taking 40m83mily are.  Hypothyroidism: Diagnosed in July with TSH 7.068. He was asymptomatic; meds were not started, but recheck in July was normal with negative antibodies. He continues to not have any thyroid-related symptoms. No changes in energy, weight, skin/bowels/nails/hair/moods.  PMH, PSH, SH  reviewed  Outpatient Encounter Prescriptions as of 01/07/2015  Medication Sig Note  . aspirin 81 MG tablet Take 81 mg by mouth daily.   . CIMarland KitchenNAMON PO Take 600 mg by mouth 3 (three) times daily.   . empagliflozin (JARDIANCE) 10 MG TABS tablet Take 10 mg by mouth daily.   . enalapril (VASOTEC) 10 MG tablet Take 1 tablet (10 mg total) by mouth daily.   . fluticasone (FLONASE) 50 MCG/ACT nasal spray Place 2 sprays into both nostrils daily. 01/07/2015: Uses prn, currently  . glucose blood test strip Use as instructed   . Lancets MISC Please test blood sugars daily   . metoprolol succinate (TOPROL-XL) 25 MG 24 hr tablet Take 1 tablet (25 mg total) by mouth daily.   . Multiple Vitamins-Minerals (MULTIVITAMIN WITH MINERALS) tablet Take 1 tablet by mouth daily.   . nitroGLYCERIN (NITROSTAT) 0.4 MG SL tablet Place 1 tablet (0.4 mg total) under the tongue every 5 (five) minutes as needed. Chest pain (Patient not taking: Reported on 05/21/2014) 04/07/2013: Doesn't need/not taking  . omeprazole (PRILOSEC) 40 MG capsule TAKE 1 CAPSULE EVERY DAY   . Saw Palmetto, Serenoa repens, 320 MG CAPS Take 1 capsule by mouth daily.   . Saxagliptin-Metformin 2.05-998 MG TB24 Take 2 tablets by mouth daily. 01/07/2015: He has only been taking 1 daily (denies ever taking the 2 as was prescribed)  . simvastatin (ZOCOR) 40 MG tablet Take 1 tablet (40 mg total) by mouth at bedtime.   . Vitamins C E (VITAMIN C & E COMPLEX) 500-400 MG-UNIT CAPS Take 1 capsule by mouth daily.   . [  DISCONTINUED] Blood Glucose Monitoring Suppl (ACCU-CHEK NANO SMARTVIEW) W/DEVICE KIT Check your blood sugars once daily   . [DISCONTINUED] Hyaluronic Acid-Vitamin C (HYALURONIC ACID PO) Take 80 mg by mouth daily. Takes 1 tablet daily   . [DISCONTINUED] meclizine (MEDI-MECLIZINE) 25 MG tablet Take 0.5-1 tablets (12.5-25 mg total) by mouth 3 (three) times daily as needed for dizziness. (Patient not taking: Reported on 08/20/2014) 04/07/2013: Takes as needed  for vertigo (rare)  . [DISCONTINUED] OVER THE COUNTER MEDICATION Take 2 tablets by mouth daily.  03/04/2012: Brand name_URINOZINC, for prostate.    No facility-administered encounter medications on file as of 01/07/2015.   He has started taking cinnamon, blood sugar metabolism support capsule and a multivitamin daily a couple of weeks ago.   No Known Allergies  ROS:  No fever, chills, URI symptoms, headaches, dizziness, GI or GU symptoms. No chest pain, shortness of breath, cough, edema, bleeding, bruising, rash, depression, bowel changes, hair/skin changes.  See HPI.  PHYSICAL EXAM: BP 130/88 mmHg  Pulse 60  Ht 5' 9"  (1.753 m)  Wt 185 lb 6.4 oz (84.097 kg)  BMI 27.37 kg/m2  Well developed, pleasant male in no distress.   In good spirits Neck: no lymphadenopathy, thyromegaly or mass Heart: regular rate and rhythm Lungs: clear bilaterally Abdomen: soft, nontender, no organomegaly or mass Extremities: no edema, normal pulses. Refused foot exam today Psych: normal mood, affect, hygiene and grooming Neuro: alert and oriented. Cranial nerves intact. Normal strength, gait  Lab Results  Component Value Date   HGBA1C 7.0 01/07/2015   Down from 7.2 by [facility] nurse in September, and here in July   ASSESSMENT/PLAN:  Diabetes mellitus without complication ([place]) - borderline control; improved. Change from [place] to alogliptin and metformin, continue Jardiance - Plan: HgB A1c, Alogliptin Benzoate 25 MG TABS, metFORMIN (GLUCOPHAGE-XR) 500 MG 24 hr tablet  Mixed hyperlipidemia - continue simvastatin and proper diet  HYPERTENSION, BENIGN - controlled  Immunization due - Plan: Pneumococcal conjugate vaccine 13-valent  GERD--discussed risks of treatment vs risk of untreated reflux.  Per his concerns, we discussed trial of OTC Prilosec to see if symptoms recur.  If not, we can see if insurance will cover 23m dose. If recurrent symptoms, stay on 493m  I'm okay with him just  continuing the 4052maily, along with taking proper supplements.  Reviewed all information that he brought in with him from [facility] all health maintenance recommendations.  He had canceled his physical, and hasn't rescheduled it yet.  Discussed pneumonia vaccines--prevnar-13 today, and pneumovax in a year. Risks/side effects reviewed. Encouraged zostavax (to get from pharmacy--he quotes $1000 as his portion, which doesn't make sense; cost should be <$400 out of pocket.  To check with pharmacy). Continue yearly diabetic eye exams. Discussed recommendation for hepatitis C screening, and will due with labs at next visit in 3 months  F/u in 3 months for fasting med check. Also r/s CPE for 6 months

## 2015-03-15 DIAGNOSIS — J029 Acute pharyngitis, unspecified: Secondary | ICD-10-CM | POA: Diagnosis not present

## 2015-03-22 ENCOUNTER — Other Ambulatory Visit: Payer: Self-pay | Admitting: *Deleted

## 2015-03-22 DIAGNOSIS — E782 Mixed hyperlipidemia: Secondary | ICD-10-CM

## 2015-03-22 MED ORDER — FLUTICASONE PROPIONATE 50 MCG/ACT NA SUSP: 2.0000 | Freq: Every day | NASAL | Status: DC

## 2015-03-22 MED ORDER — OMEPRAZOLE 40 MG PO CPDR: 40.0000 mg | DELAYED_RELEASE_CAPSULE | Freq: Every day | ORAL | Status: DC

## 2015-03-22 MED ORDER — SIMVASTATIN 40 MG PO TABS: 40.0000 mg | ORAL_TABLET | Freq: Every day | ORAL | Status: DC

## 2015-04-13 ENCOUNTER — Other Ambulatory Visit: Payer: Self-pay | Admitting: Family Medicine

## 2015-04-19 ENCOUNTER — Ambulatory Visit (INDEPENDENT_AMBULATORY_CARE_PROVIDER_SITE_OTHER): Payer: Medicare Other | Admitting: Family Medicine

## 2015-04-19 ENCOUNTER — Telehealth: Payer: Self-pay | Admitting: Family Medicine

## 2015-04-19 ENCOUNTER — Encounter: Payer: Self-pay | Admitting: Family Medicine

## 2015-04-19 VITALS — BP 148/88 | HR 60 | Ht 69.0 in | Wt 187.8 lb

## 2015-04-19 DIAGNOSIS — M10071 Idiopathic gout, right ankle and foot: Secondary | ICD-10-CM | POA: Diagnosis not present

## 2015-04-19 DIAGNOSIS — K219 Gastro-esophageal reflux disease without esophagitis: Secondary | ICD-10-CM

## 2015-04-19 DIAGNOSIS — Z5181 Encounter for therapeutic drug level monitoring: Secondary | ICD-10-CM

## 2015-04-19 DIAGNOSIS — E039 Hypothyroidism, unspecified: Secondary | ICD-10-CM

## 2015-04-19 DIAGNOSIS — E782 Mixed hyperlipidemia: Secondary | ICD-10-CM

## 2015-04-19 DIAGNOSIS — F325 Major depressive disorder, single episode, in full remission: Secondary | ICD-10-CM

## 2015-04-19 DIAGNOSIS — I10 Essential (primary) hypertension: Secondary | ICD-10-CM | POA: Diagnosis not present

## 2015-04-19 DIAGNOSIS — R002 Palpitations: Secondary | ICD-10-CM

## 2015-04-19 DIAGNOSIS — Z1159 Encounter for screening for other viral diseases: Secondary | ICD-10-CM | POA: Diagnosis not present

## 2015-04-19 DIAGNOSIS — E119 Type 2 diabetes mellitus without complications: Secondary | ICD-10-CM

## 2015-04-19 DIAGNOSIS — M109 Gout, unspecified: Secondary | ICD-10-CM

## 2015-04-19 LAB — COMPREHENSIVE METABOLIC PANEL
ALK PHOS: 53 U/L (ref 40–115)
ALT: 42 U/L (ref 9–46)
AST: 40 U/L — ABNORMAL HIGH (ref 10–35)
Albumin: 4.5 g/dL (ref 3.6–5.1)
BUN: 18 mg/dL (ref 7–25)
CALCIUM: 9 mg/dL (ref 8.6–10.3)
CHLORIDE: 100 mmol/L (ref 98–110)
CO2: 28 mmol/L (ref 20–31)
Creat: 1.41 mg/dL — ABNORMAL HIGH (ref 0.70–1.25)
GLUCOSE: 112 mg/dL — AB (ref 65–99)
POTASSIUM: 4.4 mmol/L (ref 3.5–5.3)
Sodium: 139 mmol/L (ref 135–146)
Total Bilirubin: 0.5 mg/dL (ref 0.2–1.2)
Total Protein: 7.3 g/dL (ref 6.1–8.1)

## 2015-04-19 LAB — LIPID PANEL
CHOL/HDL RATIO: 2.6 ratio (ref <=5.0)
CHOLESTEROL: 109 mg/dL — AB (ref 125–200)
HDL: 42 mg/dL (ref >=40)
LDL CALC: 40 mg/dL (ref <130)
TRIGLYCERIDES: 134 mg/dL (ref <150)
VLDL: 27 mg/dL (ref <30)

## 2015-04-19 LAB — POCT GLYCOSYLATED HEMOGLOBIN (HGB A1C): Hemoglobin A1C: 7.2

## 2015-04-19 LAB — URIC ACID: Uric Acid, Serum: 6 mg/dL (ref 4.0–7.8)

## 2015-04-19 LAB — HEPATITIS C ANTIBODY: HCV Ab: NEGATIVE

## 2015-04-19 LAB — TSH: TSH: 3.19 mIU/L (ref 0.40–4.50)

## 2015-04-19 MED ORDER — ENALAPRIL MALEATE 10 MG PO TABS: 10.0000 mg | ORAL_TABLET | Freq: Two times a day (BID) | ORAL | Status: DC

## 2015-04-19 MED ORDER — EMPAGLIFLOZIN 10 MG PO TABS: 10.0000 mg | ORAL_TABLET | Freq: Every day | ORAL | Status: DC

## 2015-04-19 MED ORDER — METOPROLOL SUCCINATE ER 25 MG PO TB24: 25.0000 mg | ORAL_TABLET | Freq: Every day | ORAL | Status: DC

## 2015-04-19 NOTE — Telephone Encounter (Signed)
Form was completed and faxed

## 2015-04-19 NOTE — Progress Notes (Signed)
Chief Complaint  Patient presents with  . Diabetes    fasting med check. Has been having palpitations. Also PA still not done on Nesina yet so has been taking Jardiance long wth [place] while waiting.   Patient presents for follow up on DM. Sugars have been running 100-115 in the mornings. He doesn't check it other times of the day. He is taking Jardiance without side effects currently, although he reports that he had a yeast infection in early January, treated with OTC meds.  No further problems.Verneita Griffes was changed due to cost to metformin XR and alogliptin. Apparently he reports that prior auth for alogliptin wasn't obtained (received Metformin, didn't take), so he never made this change.  He reports continuing on kombiglyze once daily and jardiance.  Eye exam is UTD (due in June, per pt).  Hypertension follow-up:  BP's at home had gone up to 178/82--he felt palpitations which was keeping him awake at night. Mostly only felt palpitations at bedtime, not during the day.He increased his enalapril to 2/day, and BP's came back down to 132/74 range.  He reports that his palpitations have also resolved.  He has been on this higher dose for about 2 weeks. Denies dizziness, headaches (had some when he had some neck pain for a couple of days), chest pain. Denies side effects of medications. He is trying to limit salt in his diet.  Hyperlipidemia: Reports compliance with lowfat, low cholesterol diet, and compliant with taking his simvastatin as directed.  Depression: In remission, off medication.  GERD:At last visit we recommended trial of decreasing omeprazole to 55m daily. After just a few days he had recurrent heartburn, and needed to increase dose back up to 430m  No further problems with reflux/heartburn.   Hypothyroidism: Diagnosed in April 2016 with TSH 7.068. He was asymptomatic; meds were not started, but recheck in July was normal with negative antibodies. He continues to not  have any thyroid-related symptoms. No changes in energy, weight, skin/bowels/nails/hair/moods.  H/o gout flare about 2 years ago.  Denies further flares.  He tries to watch his diet.  PMH, PSH, SH reviewed.  Outpatient Encounter Prescriptions as of 04/19/2015  Medication Sig Note  . aspirin 81 MG tablet Take 81 mg by mouth daily.   . Marland KitchenINNAMON PO Take 600 mg by mouth 3 (three) times daily.   . empagliflozin (JARDIANCE) 10 MG TABS tablet Take 10 mg by mouth daily.   . enalapril (VASOTEC) 10 MG tablet Take 1 tablet (10 mg total) by mouth 2 (two) times daily.   . fluticasone (FLONASE) 50 MCG/ACT nasal spray Place 2 sprays into both nostrils daily.   . Marland Kitchenlucose blood test strip Use as instructed   . Lancets MISC Please test blood sugars daily   . metoprolol succinate (TOPROL-XL) 25 MG 24 hr tablet Take 1 tablet (25 mg total) by mouth daily.   . Multiple Vitamins-Minerals (MULTIVITAMIN WITH MINERALS) tablet Take 1 tablet by mouth daily.   . Omega-3 Fatty Acids (FISH OIL PO) Take 1 capsule by mouth daily.   . Marland Kitchenmeprazole (PRILOSEC) 40 MG capsule Take 1 capsule (40 mg total) by mouth daily.   . Saw Palmetto, Serenoa repens, 320 MG CAPS Take 1 capsule by mouth daily.   . Saxagliptin-Metformin (KOMBIGLYZE XR) 2.05-998 MG TB24 Take 1 tablet by mouth daily.   . simvastatin (ZOCOR) 40 MG tablet Take 1 tablet (40 mg total) by mouth at bedtime.   . [DISCONTINUED] empagliflozin (JARDIANCE) 10 MG TABS tablet Take 10 mg  by mouth daily.   . [DISCONTINUED] enalapril (VASOTEC) 10 MG tablet Take 1 tablet (10 mg total) by mouth daily. 04/19/2015: Taking one in the am and one in the evening  . [DISCONTINUED] metoprolol succinate (TOPROL-XL) 25 MG 24 hr tablet Take 1 tablet (25 mg total) by mouth daily.   . [DISCONTINUED] Multiple Vitamins-Minerals (MULTIVITAMIN WITH MINERALS) tablet Take 1 tablet by mouth daily.   . [DISCONTINUED] Vitamins C E (VITAMIN C & E COMPLEX) 500-400 MG-UNIT CAPS Take 1 capsule by mouth daily.    . Alogliptin Benzoate 25 MG TABS Take 1 tablet by mouth daily. (Patient not taking: Reported on 04/19/2015) 04/19/2015: Hasn't yet changed.  Still taking Kombiglyze once daily  . metFORMIN (GLUCOPHAGE-XR) 500 MG 24 hr tablet Take 2 tablets (1,000 mg total) by mouth daily with breakfast. (Patient not taking: Reported on 04/19/2015) 04/19/2015: Didn't start yet since still taking Kombiglyze  . nitroGLYCERIN (NITROSTAT) 0.4 MG SL tablet Place 1 tablet (0.4 mg total) under the tongue every 5 (five) minutes as needed. Chest pain (Patient not taking: Reported on 05/21/2014) 04/07/2013: Doesn't need/not taking   No facility-administered encounter medications on file as of 04/19/2015.   No Known Allergies  ROS: no fever, chills, URI symptoms, cough, chest pain, shortness of breath.  Palpitations as per HPI, resolved.  No nausea, reflux resolved, no bowel changes, urinary complaints, bleeding, bruising, rash, depression or any other concerns.  PHYSICAL EXAM: BP 160/82 mmHg  Pulse 60  Ht 5' 9"  (1.753 m)  Wt 187 lb 12.8 oz (85.186 kg)  BMI 27.72 kg/m2 BP increased to 170/82 on repeat by MD. Recheck by nurse: 144/88  Well developed, pleasant male in no distress.  In good spirits Neck: no lymphadenopathy, thyromegaly or mass, no carotid bruit Heart: regular rate and rhythm, no murmur, rub, gallop or ectopy Lungs: clear bilaterally Abdomen: soft, nontender, no organomegaly or mass Extremities: no edema, normal pulses. Normal diabetic foot exam. Psych: normal mood, affect, hygiene and grooming Neuro: alert and oriented. Cranial nerves intact. Normal strength, gait   Lab Results  Component Value Date   HGBA1C 7.2 04/19/2015   ASSESSMENT/PLAN:  Type 2 diabetes mellitus not at goal [facility]) - trying to maximize doses of gliptin and metformin by changing to alogliptin/met separately sine insurance won't cover 2/d kombiglyze - Plan: Comprehensive metabolic panel, TSH, Microalbumin / creatinine urine  ratio, empagliflozin (JARDIANCE) 10 MG TABS tablet  Diabetes mellitus without complication ([place]) - Plan: HgB A1c  Mixed hyperlipidemia - continue current meds and lowfat diet - Plan: Lipid panel  HYPERTENSION, BENIGN - pt increased ACEI dose on his own. BP's improved at home, still high here. continue BID dosing, f/u if remain elevated at home - Plan: Comprehensive metabolic panel, enalapril (VASOTEC) 10 MG tablet, metoprolol succinate (TOPROL-XL) 25 MG 24 hr tablet  Depression, major, in remission ([place])  Palpitations - pt advised to schedule OV here or with cardiologist rather than make med changes on his own in the future. Symptoms now resolved  Gastroesophageal reflux disease, esophagitis presence not specified - controlled, requiring 78m daily  Medication monitoring encounter - Plan: Lipid panel, Comprehensive metabolic panel  Need for hepatitis C screening test - Plan: Hepatitis C antibody  Acute gout of right foot, unspecified cause - no flares in a couple of years. continue dietary modifications - Plan: Uric acid  Hypothyroidism, unspecified hypothyroidism type - h/o elevated TSH, normal repeat. no symptoms, recheck today - Plan: TSH  HYPERTENSION, BENIGN - Plan: Comprehensive metabolic panel, enalapril (VASOTEC)  10 MG tablet, metoprolol succinate (TOPROL-XL) 25 MG 24 hr tablet  Type 2 diabetes mellitus not at goal [facility]) - Plan: Comprehensive metabolic panel, TSH, Microalbumin / creatinine urine ratio, empagliflozin (JARDIANCE) 10 MG TABS tablet   Hep C Ab, c-met, lipids, TSH, urine microalbumin, uric acid  F/u as scheduled for CPE in June. Likely will only need A1c, CBC, PSA medicare at that visit

## 2015-04-19 NOTE — Patient Instructions (Signed)
Continue the enalapril at twice daily dosing. Please continue to monitor your blood pressure--please keep a list and bring the list (and your monitor, so we can verify the accuracy of the machine) to your physical in June.  Please call and schedule an appointment with Dr. Harrington Challenger, as you reported you were past due, and have recently been having cardiac symptoms.  We will need to work on figuring out which diabetes medication is best covered/affordable. You are NOT at goal, and I want the A1c to be under 7 in order to minimize complications from diabetes. If Nesina (alogliptin) is less expensive, then we will switch to this along with the metformin as was the original plan from December. If we cannot make this change, then we should change from [facility] once daily as you have been taking, which is too low of a dose, to a 5mg  onglyza plus separate metformin.  This should get you to goal--I'm hoping that the alogliptin will be less expensive than the onglyza.  PLEASE CALL us WITH ANY QUESTIONS/CONCERNS.  Do not changed your medications without consulting [place]!

## 2015-04-20 LAB — MICROALBUMIN / CREATININE URINE RATIO
Creatinine, Urine: 143 mg/dL (ref 20–370)
MICROALB UR: 1 mg/dL
MICROALB/CREAT RATIO: 7 ug/mg{creat} (ref <30)

## 2015-05-02 NOTE — Telephone Encounter (Signed)
P.A. Approved til 01/23/16

## 2015-05-03 NOTE — Telephone Encounter (Signed)
Left message for pt

## 2015-05-10 ENCOUNTER — Other Ambulatory Visit: Payer: Self-pay | Admitting: Family Medicine

## 2015-06-07 ENCOUNTER — Other Ambulatory Visit: Payer: Self-pay | Admitting: Family Medicine

## 2015-07-08 ENCOUNTER — Ambulatory Visit (INDEPENDENT_AMBULATORY_CARE_PROVIDER_SITE_OTHER): Payer: Medicare Other | Admitting: Family Medicine

## 2015-07-08 ENCOUNTER — Encounter: Payer: Self-pay | Admitting: Family Medicine

## 2015-07-08 VITALS — BP 124/84 | HR 60 | Ht 68.25 in | Wt 191.2 lb

## 2015-07-08 DIAGNOSIS — M79644 Pain in right finger(s): Secondary | ICD-10-CM | POA: Diagnosis not present

## 2015-07-08 DIAGNOSIS — I10 Essential (primary) hypertension: Secondary | ICD-10-CM

## 2015-07-08 DIAGNOSIS — E782 Mixed hyperlipidemia: Secondary | ICD-10-CM | POA: Diagnosis not present

## 2015-07-08 DIAGNOSIS — K219 Gastro-esophageal reflux disease without esophagitis: Secondary | ICD-10-CM | POA: Diagnosis not present

## 2015-07-08 DIAGNOSIS — F325 Major depressive disorder, single episode, in full remission: Secondary | ICD-10-CM | POA: Diagnosis not present

## 2015-07-08 DIAGNOSIS — Z0000 Encounter for general adult medical examination without abnormal findings: Secondary | ICD-10-CM | POA: Diagnosis not present

## 2015-07-08 DIAGNOSIS — Z9114 Patient's other noncompliance with medication regimen: Secondary | ICD-10-CM

## 2015-07-08 LAB — POCT URINALYSIS DIPSTICK
Bilirubin, UA: NEGATIVE
GLUCOSE UA: NEGATIVE
Ketones, UA: NEGATIVE
Leukocytes, UA: NEGATIVE
Nitrite, UA: NEGATIVE
PH UA: 6
Protein, UA: NEGATIVE
RBC UA: NEGATIVE
SPEC GRAV UA: 1.025
UROBILINOGEN UA: NEGATIVE

## 2015-07-08 MED ORDER — NAPROXEN 500 MG PO TABS
500.0000 mg | ORAL_TABLET | Freq: Two times a day (BID) | ORAL | Status: DC
Start: 2015-07-08 — End: 2015-08-19

## 2015-07-08 NOTE — Patient Instructions (Addendum)
HEALTH MAINTENANCE RECOMMENDATIONS:  It is recommended that you get at least 30 minutes of aerobic exercise at least 5 days/week (for weight loss, you may need as much as 60-90 minutes). This can be any activity that gets your heart rate up. This can be divided in 10-15 minute intervals if needed, but try and build up your endurance at least once a week.  Weight bearing exercise is also recommended twice weekly.  Eat a healthy diet with lots of vegetables, fruits and fiber.  "Colorful" foods have a lot of vitamins (ie green vegetables, tomatoes, red peppers, etc).  Limit sweet tea, regular sodas and alcoholic beverages, all of which has a lot of calories and sugar.  Up to 2 alcoholic drinks daily may be beneficial for men (unless trying to lose weight, watch sugars).  Drink a lot of water.  Sunscreen of at least SPF 30 should be used on all sun-exposed parts of the skin when outside between the hours of 10 am and 4 pm (not just when at beach or pool, but even with exercise, golf, tennis, and yard work!)  Use a sunscreen that says "broad spectrum" so it covers both UVA and UVB rays, and make sure to reapply every 1-2 hours.  Remember to change the batteries in your smoke detectors when changing your clock times in the spring and fall.  Use your seat belt every time you are in a car, and please drive safely and not be distracted with cell phones and texting while driving.     Gregory Friedman , Thank you for taking time to come for your Medicare Wellness Visit. I appreciate your ongoing commitment to your health goals. Please review the following plan we discussed and let me know if I can assist you in the future.   These are the goals we discussed: Goals    None      This is a list of the screening recommended for you and due dates:  Health Maintenance  Topic Date Due  . HIV Screening  09/11/1965  . Shingles Vaccine  09/11/2009  . Colon Cancer Screening  02/15/2014  . Eye exam for  diabetics  06/23/2015  . Flu Shot  08/24/2015  . Hemoglobin A1C  10/20/2015  . Pneumonia vaccines (2 of 2 - PPSV23) 01/07/2016  . Complete foot exam   04/18/2016  . Tetanus Vaccine  05/28/2019  .  Hepatitis C: One time screening is recommended by Center for Disease Control  (CDC) for  adults born from [birth year] through 1966.   Completed   Check with your gastroenterologist's office, as it appears that they put in that you were due for a 3 year recheck rather than 5 years.  Call and see if that is the case. Shingles vaccine (if affordable) is recommended.   Please get [place] copies of your Living Will and healthcare power of attorney. If you did not fill these out, you were given new forms today, and then please fill these out and return them after getting them notarized.  Please call your eye doctor to schedule annual diabetic eye exam (and make sure they send [place] a copy of the report please).  Please check your blood sugars at least twice a week later in the day (2 hours after a meal, or at bedtime), and record these on a piece of paper along with your morning sugars (ideally, keep the  Morning and evening sugars in separate columns). Please do not make any changes to your medications  without first discussing with our office. If you have problems affording your medications, please let us know, so that we can come up with alternatives, or help provide samples, rather than just stopping the medications on your own.  It is very important that we have open lines of communication when it comes to your health and your medications. I want to be aware of when you have a problem with a medication (cost, side effect), or if you make any changes to your medications.    Take naproxen twice daily with food for 10 days (up to 15 days, if not better). Take this instead of ibuprofen (don't take both together). Take a break from the guitar to allow it to rest. You may continue to use ice and biofreeze as needed. We  will recheck this in 2 weeks. Return after 6/27 for follow up on your thumb pain, and also we will draw your labs (HgA1c for diabetes, and the prostate and blood count checks that are due). You can eat prior to this visit.  Restart the kombiglyze that you have at home (you paid for it--might as well!)--take it once daily ,along with the 2 metformin daily. Bring your list of blood sugars (including the evening values) to you visit in 2 weeks.

## 2015-07-08 NOTE — Progress Notes (Signed)
Chief Complaint  Patient presents with  . Medicare Wellness    CPE/AWV, nonfasting. Right thumb has been swollen and painful for 3-4 days and is worsening and he cannot play his guitar.    Gregory Friedman is a 65 y.o. male who presents for complete physical, annual wellness visit and follow-up on chronic medical conditions.  He has the following concerns:  He is complaining of pain in his right thumb x 1 week. It hurts at the base of the thumb and he has clicking at the IP joint.  Denies any new activity, just playing his guitar. Biofreeze helps some. He tried heat and cold, and cold worked better.  Diabetes follow-up:  Sugars have been running 105-122 in the mornings. He doesn't check it other times of the day (he has been asked to in the past). He had been on Jardiance; he stopped this on his own about 3 months ago after having additional yeast infection (which he treated with OTC creams only, didn't contact us).   He did not communicate this to our office.  He also had problems getting 2 Kombiglyze daily covered by his insurance.  We got approval to change to alogliptin, but apparently this copay was expensive, and he didn't let us know this wasn't affordable.  He never started this.  He is only taking 2 metformin daily.  He previously took kombiglyze--was supposed to take 1/day along with metformin until he switched to alogliptin.  He never completed the Kombiglyze (still has some).  Hypertension follow-up: BP's at home last night was 132/71, with his new monitor. He has been on enalapril twice daily.  Denies dizziness, headaches (just posterior headaches, at the base of the spine).  Denies side effects of medications. He is trying to limit salt in his diet.  He has been having posterior headaches for the last couple of weeks.  He tried various pillows, but that hasn't helped. Ibuprofen and biofreeze have helped.  Hyperlipidemia: Reports compliance with lowfat, low cholesterol diet, and  compliant with taking his simvastatin as directed, and 1 fish oil daily.  Depression: In remission, off medication.  GERD:  Symptoms are relieved by taking 40mg  of omeprazole daily.  He had recurrent symptoms when on the lower 20mg  dose. No further problems with reflux/heartburn.   Hypothyroidism: Diagnosed in April 2016 with TSH 7.068. He was asymptomatic; meds were not started, but recheck in July 2016 was normal with negative antibodies. He continues to not have any thyroid-related symptoms. No changes in energy, weight, skin/bowels/nails/hair/moods. Lab Results  Component Value Date   TSH 3.19 04/19/2015   H/o gout flare about 2 years ago. Denies further flares. He tries to watch his diet. Lab Results  Component Value Date   LABURIC 6.0 04/19/2015    Immunization History  Administered Date(s) Administered  . Influenza Split 11/24/2010, 12/06/2011  . Influenza, High Dose Seasonal PF 11/17/2014  . Influenza,inj,Quad PF,36+ Mos 11/25/2012, 01/01/2014  . Pneumococcal Conjugate-13 01/07/2015  . Pneumococcal Polysaccharide-23 10/21/2009  . Tdap 05/27/2009   has checked with insurance re: cost of zostavax and the copay is not affordable. Last colonoscopy: 01/2011, due again 2018 (HM in chart states due 2016---he needs to check with his GI.  Last PSA:  Lab Results  Component Value Date   PSA 1.20 05/14/2014   PSA 1.08 04/04/2013   PSA 0.58 08/22/2010   Dentist: twice yearly Ophtho: 05/2014 Exercise: rides on exercise bike 15-20 minutes 6 days/week. No weight-bearing activity:  Other doctors caring for patient  include: GI: Dr. Deatra Ina Ophtho: Dr. P[facility]y Pronto at [facility] ENT: Dr. Lucia Gaskins Cardiology: saw Dr. Harrington Challenger in the past Dentist: Dr. Ysidro Evert Oncology (and radiation): Dr. Isidore Moos, Dr. Lamonte Sakai  Depression screen: Negative Fall screen: negative Functional status screen: unremarkable See full screens in epic   End of Life Discussion:  Patient has a living will and  medical power of attorney--believes he did this last year, not sure; never gave [place] a copy.  Past Medical History  Diagnosis Date  . DM (diabetes mellitus) ([place])     type II.   [name] GERD (gastroesophageal reflux disease)   . Hyperlipidemia   . Microscopic hematuria     referred to urology 2012  . Myocardial infarction [facility]) 2003    with both PCI, and CABG (Dr. Dorris Carnes)  . BPH (benign prostatic hyperplasia)   . Arthritis   . Anxiety   . Depression   . Hypertension     sees Dr  Tera Helper. Izzabella Besse  . Diverticulosis   . S/P radiation therapy     Received 8 fractions, then stopped due to mucositis with Radiation/ Cisplatin  . BPV (benign positional vertigo) 2012    improved after vestibular rehab  . 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 Serrated adenoma of colon     2013  . Gout attack 2015    right great toe    Past Surgical History  Procedure Laterality Date  . Fracture surgery Right     RT TALAR REPAIR  . Cardiac catheterization  05/2010  . Cardiovascular stress test  07/2009  . Coronary angioplasty  2003; 2006    2 stents in heart  . Coronary artery bypass graft  01/2003    2 vessels  . Cystoscopy  02/09/2011    Procedure: CYSTOSCOPY;  Surgeon: Marissa Nestle, MD;  Location: [facility];  Service: Urology;  Laterality: N/A;  . Colonoscopy  01/2011    polyp; next due 2018.   . Radical neck dissection Left 03/13/2012    Procedure: RADICAL NECK DISSECTION;  Surgeon: Rozetta Nunnery, MD;  Location: [place];  Service: ENT;  Laterality: Left;  . Tooth extraction N/A 03/13/2012    Procedure: EXTRACTION MOLARS;  Surgeon: Rozetta Nunnery, MD;  Location: [place];  Service: ENT;  Laterality: N/A;  . Mass excision N/A 03/13/2012    Procedure: EXCISION LEFT BUCCAL MUCOSA;  Surgeon: Rozetta Nunnery, MD;  Location: [place];  Service: ENT;  Laterality: N/A;    Social History   Social History  . Marital Status: Married    Spouse Name:  N/A  . Number of Children: 3  . Years of Education: N/A   Occupational History  . 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 Smoking status: Never Smoker   . Smokeless tobacco: Never Used  . Alcohol Use: No  . Drug Use: No  . Sexual Activity: Not on file   Other Topics Concern  . Not on file   Social History Narrative   Married;  2 children in [place], son in [place]; 6 grandchildren          Family History  Problem Relation Age of Onset  . Stroke Sister   . Anesthesia problems Neg Hx   . Hypotension Neg Hx   . Malignant hyperthermia Neg Hx   . Pseudochol deficiency Neg Hx   . Colon cancer Neg  Hx   . Diabetes Brother   . Seizures Mother   . Asthma Father   . Cancer Daughter     thyroid cancer  . Diabetes Sister   . Parkinson's disease Brother   . Heart disease Brother   . Diabetes Brother     Outpatient Encounter Prescriptions as of 07/08/2015  Medication Sig Note  . aspirin 81 MG tablet Take 81 mg by mouth daily.   [name] CINNAMON PO Take 600 mg by mouth 3 (three) times daily.   . enalapril (VASOTEC) 10 MG tablet Take 1 tablet (10 mg total) by mouth 2 (two) times daily.   . fluticasone (FLONASE) 50 MCG/ACT nasal spray Place 2 sprays into both nostrils daily.   [name] glucose blood test strip Use as instructed   . Lancets MISC Please test blood sugars daily   . metFORMIN (GLUCOPHAGE-XR) 500 MG 24 hr tablet Take 2 tablets (1,000 mg total) by mouth daily with breakfast.   . metoprolol succinate (TOPROL-XL) 25 MG 24 hr tablet Take 1 tablet by mouth  daily   . Multiple Vitamins-Minerals (MULTIVITAMIN WITH MINERALS) tablet Take 1 tablet by mouth daily.   . Omega-3 Fatty Acids (FISH OIL PO) Take 1 capsule by mouth daily.   [name] omeprazole (PRILOSEC) 40 MG capsule Take 1 capsule by mouth  daily   . Saw Palmetto, Serenoa repens, 320 MG CAPS Take 1 capsule by mouth daily.   . simvastatin (ZOCOR) 40 MG tablet Take 1 tablet by mouth at  bedtime   . Alogliptin Benzoate 25  MG TABS Take 1 tablet by mouth daily. (Patient not taking: Reported on 04/19/2015) 07/08/2015: Never started this, too expensive of a copay/tier  . empagliflozin (JARDIANCE) 10 MG TABS tablet Take 10 mg by mouth daily. (Patient not taking: Reported on 07/08/2015) 07/08/2015: He ran out.  Had yeast infections that he treated with OTC meds (didn't call). Stopped taking 3 months ago  . nitroGLYCERIN (NITROSTAT) 0.4 MG SL tablet Place 1 tablet (0.4 mg total) under the tongue every 5 (five) minutes as needed. Chest pain (Patient not taking: Reported on 05/21/2014) 04/07/2013: Doesn't need/not taking  . [DISCONTINUED] Saxagliptin-Metformin (KOMBIGLYZE XR) 2.05-998 MG TB24 Take 1 tablet by mouth daily. Reported on 07/08/2015 07/08/2015: He stopped taking (still has some at home)   No facility-administered encounter medications on file as of 07/08/2015.    No Known Allergies   ROS: The patient denies anorexia, fever, weight changes, headaches, vision loss, decreased hearing, ear pain, hoarseness, chest pain, palpitations, dizziness, syncope, dyspnea on exertion, cough, swelling, nausea, vomiting, diarrhea, constipation, abdominal pain, melena, hematochezia, indigestion/heartburn, hematuria, incontinence, erectile dysfunction, nocturia (once or twice/night), weakened urine stream, dysuria, genital lesions, joint pains, numbness, tingling, weakness, tremor, suspicious skin lesions, abnormal bruising, or enlarged lymph nodes. Chronic tinnitus, tolerable (not as bad, he ignores it) BPH symptoms have been improved since taking saw palmetto combo Denies recurrent symptoms of depression No further toe pain He has internal hemorrhoid (that slides out sometimes); not painful, itchy, no bleeding  PHYSICAL EXAM:  BP 124/84 mmHg  Pulse 60  Ht 5' 8.25" (1.734 m)  Wt 191 lb 3.2 oz (86.728 kg)  BMI 28.84 kg/m2  General Appearance:   Alert, cooperative, no distress, appears stated age  Head:   Normocephalic,  without obvious abnormality, atraumatic. He has healed nicely without significant deformity from his cancer surgery.  Eyes:   PERRL, conjunctiva/corneas clear, EOM's intact, fundi   benign  Ears:   Normal TM's and external ear  canals  Nose:  Nares normal, mucosa normal, no drainage or sinus tenderness  Throat:  Lips, mucosa, and tongue normal; teeth and gums normal.   Neck:  Supple, no lymphadenopathy; thyroid: no enlargement/tenderness/nodules; no carotid  bruit or JVD  Back:  Spine nontender, no curvature, ROM normal, no CVA tenderness  Lungs:   Clear to auscultation bilaterally without wheezes, rales or ronchi; respirations unlabored  Chest Wall:   No tenderness or deformity. WHSS midline  Heart:   Regular rate and rhythm, S1 and S2 normal, no murmur, rub  or gallop  Breast Exam:   No chest wall tenderness, masses or gynecomastia  Abdomen:   Soft, non-tender, nondistended, normoactive bowel sounds,   no masses, no hepatosplenomegaly  Genitalia:   Normal male external genitalia without lesions or rash.uncircumcized. Testicles without masses. No inguinal hernias.  Rectal:   Normal sphincter tone. Prostate smooth, moderately enlarged, no nodule or mass.There is a pedunculated, mobile mass that is nontender. Too mobile to suspect a hemorrhoid, although that is what was seen on anoscopy last year.  Unchanged from last year. Stool is heme negative.  Extremities:  No clubbing, cyanosis or edema. Mildly tender at the 1st MCP on the right hand.  He also has some tenderness over the thenar eminence.  No warmth or swelling noted.  Pulses:  2+ and symmetric all extremities  Skin:  Skin color, texture, turgor normal, no rashes or lesions  Lymph nodes:  Cervical, supraclavicular, and axillary nodes normal  Neurologic:  CNII-XII intact, normal strength, sensation and gait; reflexes 2+ and  symmetric throughout   Psych: Normal mood, affect, hygiene and grooming             Normal diabetic foot exam   ASSESSMENT/PLAN:  Annual physical exam - Plan: Visual acuity screening, POCT Urinalysis Dipstick  Thumb pain, right - Ddx tendonitis/strain, degenerative changes, gout (doubt). trial of rest and NSAIDs. f/u 2 weeks - Plan: naproxen (NAPROSYN) 500 MG tablet  Noncompliance with medications - briefy discussed potential for dismissal if ongoing noncompliance without communicating with [place]  HYPERTENSION, BENIGN - controlled  Mixed hyperlipidemia - continue simvastatin  Depression, major, in remission ([place])  Gastroesophageal reflux disease, esophagitis presence not specified - controlled; didn't tolerate lower doses; continue PPI and MVI  Medicare annual wellness visit, initial   A1c-- due at the end of the month, too soon today.  Do at 2 week f/u, along with CBC and PSA medicare (other labs should be UTD, done in March)   Recommended at least 30 minutes of aerobic activity at least 5 days/week, weight-bearing exercise at least 2x/wk; proper sunscreen use reviewed; healthy diet and alcohol recommendations (less than or equal to 2 drinks/day) reviewed; regular seatbelt use; changing batteries in smoke detectors. Immunization recommendations discussed-continue yearly flu shots. Consider zostavax if/when affordable. Colonoscopy recommendations reviewed--due again 2018 or 2016?? Check with GI   Please get [place] copies of your Living Will and healthcare power of attorney. If you did not fill these out, you were given new forms today, and then please fill these out and return them after getting them notarized.  Please call your eye doctor to schedule annual diabetic eye exam (and make sure they send [place] a copy of the report please).  Please check your blood sugars at least twice a week later in the day (2 hours after a meal, or at bedtime), and  record these on a piece of paper along with your morning sugars (ideally, keep the  Morning and  evening sugars in separate columns). Please do not make any changes to your medications without first discussing with our office. If you have problems affording your medications, please let us know, so that we can come up with alternatives, or help provide samples, rather than just stopping the medications on your own.  It is very important that we have open lines of communication when it comes to your health and your medications. I want to be aware of when you have a problem with a medication (cost, side effect), or if you make any changes to your medications.    Take naproxen twice daily with food for 10 days (up to 15 days, if not better). Take this instead of ibuprofen (don't take both together). Take a break from the guitar to allow it to rest. You may continue to use ice and biofreeze as needed. We will recheck this in 2 weeks. Return after 6/27 for follow up on your thumb pain, and also we will draw your labs (HgA1c for diabetes, and the prostate and blood count checks that are due). You can eat prior to this visit.  Restart the kombiglyze that you have at home (you paid for it--might as well!)--take it once daily ,along with the 2 metformin daily. Bring your list of blood sugars (including the evening values) to you visit in 2 weeks.    Medicare Attestation I have personally reviewed: The patient's medical and social history Their use of alcohol, tobacco or illicit drugs Their current medications and supplements The patient's functional ability including ADLs,fall risks, home safety risks, cognitive, and hearing and visual impairment Diet and physical activities Evidence for depression or mood disorders  The patient's weight, height, BMI, and visual acuity have been recorded in the chart.  I have made referrals, counseling, and provided education to the patient based on review of the above  and I have provided the patient with a written personalized care plan for preventive services.     Stevens Magwood A, MD   07/08/2015

## 2015-07-12 ENCOUNTER — Other Ambulatory Visit: Payer: Self-pay | Admitting: Family Medicine

## 2015-07-15 ENCOUNTER — Observation Stay (HOSPITAL_COMMUNITY)
Admission: EM | Admit: 2015-07-15 | Discharge: 2015-07-15 | Disposition: A | Discharge status: Home / Self Care | Payer: Medicare Other | Attending: Family Medicine | Admitting: Family Medicine

## 2015-07-15 ENCOUNTER — Other Ambulatory Visit: Payer: Self-pay

## 2015-07-15 ENCOUNTER — Emergency Department (HOSPITAL_COMMUNITY): Payer: Medicare Other

## 2015-07-15 ENCOUNTER — Encounter (HOSPITAL_COMMUNITY): Payer: Self-pay | Admitting: *Deleted

## 2015-07-15 ENCOUNTER — Observation Stay (HOSPITAL_COMMUNITY): Payer: Medicare Other

## 2015-07-15 DIAGNOSIS — Z7984 Long term (current) use of oral hypoglycemic drugs: Secondary | ICD-10-CM | POA: Diagnosis not present

## 2015-07-15 DIAGNOSIS — R0789 Other chest pain: Secondary | ICD-10-CM | POA: Diagnosis not present

## 2015-07-15 DIAGNOSIS — E785 Hyperlipidemia, unspecified: Secondary | ICD-10-CM | POA: Diagnosis not present

## 2015-07-15 DIAGNOSIS — E119 Type 2 diabetes mellitus without complications: Secondary | ICD-10-CM | POA: Insufficient documentation

## 2015-07-15 DIAGNOSIS — R079 Chest pain, unspecified: Secondary | ICD-10-CM | POA: Diagnosis not present

## 2015-07-15 DIAGNOSIS — I2510 Atherosclerotic heart disease of native coronary artery without angina pectoris: Secondary | ICD-10-CM | POA: Diagnosis not present

## 2015-07-15 DIAGNOSIS — Z7982 Long term (current) use of aspirin: Secondary | ICD-10-CM | POA: Diagnosis not present

## 2015-07-15 DIAGNOSIS — K219 Gastro-esophageal reflux disease without esophagitis: Secondary | ICD-10-CM | POA: Diagnosis present

## 2015-07-15 DIAGNOSIS — Z951 Presence of aortocoronary bypass graft: Secondary | ICD-10-CM | POA: Insufficient documentation

## 2015-07-15 DIAGNOSIS — Z85818 Personal history of malignant neoplasm of other sites of lip, oral cavity, and pharynx: Secondary | ICD-10-CM | POA: Diagnosis not present

## 2015-07-15 DIAGNOSIS — E782 Mixed hyperlipidemia: Secondary | ICD-10-CM | POA: Diagnosis present

## 2015-07-15 DIAGNOSIS — I10 Essential (primary) hypertension: Secondary | ICD-10-CM | POA: Diagnosis not present

## 2015-07-15 DIAGNOSIS — I252 Old myocardial infarction: Secondary | ICD-10-CM | POA: Insufficient documentation

## 2015-07-15 DIAGNOSIS — I213 ST elevation (STEMI) myocardial infarction of unspecified site: Secondary | ICD-10-CM | POA: Diagnosis not present

## 2015-07-15 DIAGNOSIS — Z955 Presence of coronary angioplasty implant and graft: Secondary | ICD-10-CM | POA: Insufficient documentation

## 2015-07-15 LAB — BASIC METABOLIC PANEL
Anion gap: 8 (ref 5–15)
BUN: 16 mg/dL (ref 6–20)
CALCIUM: 9.4 mg/dL (ref 8.9–10.3)
CO2: 28 mmol/L (ref 22–32)
Chloride: 104 mmol/L (ref 101–111)
Creatinine, Ser: 1.39 mg/dL — ABNORMAL HIGH (ref 0.61–1.24)
GFR calc Af Amer: 60 mL/min — ABNORMAL LOW (ref >60)
GFR, EST NON AFRICAN AMERICAN: 52 mL/min — AB (ref >60)
Glucose, Bld: 134 mg/dL — ABNORMAL HIGH (ref 65–99)
POTASSIUM: 3.9 mmol/L (ref 3.5–5.1)
SODIUM: 140 mmol/L (ref 135–145)

## 2015-07-15 LAB — CBC
HEMATOCRIT: 39.4 % (ref 39.0–52.0)
Hemoglobin: 12.8 g/dL — ABNORMAL LOW (ref 13.0–17.0)
MCH: 26.8 pg (ref 26.0–34.0)
MCHC: 32.5 g/dL (ref 30.0–36.0)
MCV: 82.4 fL (ref 78.0–100.0)
Platelets: 172 10*3/uL (ref 150–400)
RBC: 4.78 MIL/uL (ref 4.22–5.81)
RDW: 13.1 % (ref 11.5–15.5)
WBC: 5.3 10*3/uL (ref 4.0–10.5)

## 2015-07-15 LAB — NM MYOCAR MULTI W/SPECT W/WALL MOTION / EF
Peak HR: 75 {beats}/min
Rest HR: 53 {beats}/min

## 2015-07-15 LAB — TROPONIN I: Troponin I: <0.03 ng/mL (ref <0.031)

## 2015-07-15 LAB — GLUCOSE, CAPILLARY
GLUCOSE-CAPILLARY: 145 mg/dL — AB (ref 65–99)
Glucose-Capillary: 211 mg/dL — ABNORMAL HIGH (ref 65–99)

## 2015-07-15 LAB — I-STAT TROPONIN, ED: TROPONIN I, POC: 0 ng/mL (ref 0.00–0.08)

## 2015-07-15 MED ORDER — METFORMIN HCL ER 500 MG PO TB24: 1000.0000 mg | ORAL_TABLET | Freq: Every day | ORAL | Status: DC

## 2015-07-15 MED ORDER — SAXAGLIPTIN-METFORMIN ER 2.5-1000 MG PO TB24: 1.0000 | ORAL_TABLET | Freq: Every day | ORAL | Status: DC

## 2015-07-15 MED ORDER — ACETAMINOPHEN 325 MG PO TABS: 650.0000 mg | ORAL_TABLET | ORAL | Status: DC | PRN

## 2015-07-15 MED ORDER — GI COCKTAIL ~~LOC~~: 30.0000 mL | Freq: Four times a day (QID) | ORAL | Status: DC | PRN

## 2015-07-15 MED ORDER — ASPIRIN 81 MG PO CHEW: 81.0000 mg | CHEWABLE_TABLET | Freq: Every day | ORAL | Status: DC

## 2015-07-15 MED ORDER — TECHNETIUM TC 99M TETROFOSMIN IV KIT
10.0000 | PACK | Freq: Once | INTRAVENOUS | Status: AC | PRN
  Administered 2015-07-15: 10 via INTRAVENOUS

## 2015-07-15 MED ORDER — NITROGLYCERIN 0.4 MG SL SUBL
0.4000 mg | SUBLINGUAL_TABLET | SUBLINGUAL | Status: DC | PRN
Start: 2015-07-15 — End: 2015-07-15
  Administered 2015-07-15: 0.4 mg via SUBLINGUAL
  Filled 2015-07-15: qty 1

## 2015-07-15 MED ORDER — ASPIRIN EC 325 MG PO TBEC: 325.0000 mg | DELAYED_RELEASE_TABLET | Freq: Every day | ORAL | Status: DC

## 2015-07-15 MED ORDER — ZOLPIDEM TARTRATE 5 MG PO TABS: 5.0000 mg | ORAL_TABLET | Freq: Every evening | ORAL | Status: DC | PRN

## 2015-07-15 MED ORDER — ATORVASTATIN CALCIUM 80 MG PO TABS
80.0000 mg | ORAL_TABLET | Freq: Every day | ORAL | Status: DC
  Administered 2015-07-15: 80 mg via ORAL
  Filled 2015-07-15: qty 1

## 2015-07-15 MED ORDER — INSULIN ASPART 100 UNIT/ML ~~LOC~~ SOLN
0.0000 [IU] | Freq: Three times a day (TID) | SUBCUTANEOUS | Status: DC
  Administered 2015-07-15: 3 [IU] via SUBCUTANEOUS

## 2015-07-15 MED ORDER — ONDANSETRON HCL 4 MG/2ML IJ SOLN: 4.0000 mg | Freq: Four times a day (QID) | INTRAMUSCULAR | Status: DC | PRN

## 2015-07-15 MED ORDER — SIMVASTATIN 40 MG PO TABS: 40.0000 mg | ORAL_TABLET | Freq: Every day | ORAL | Status: DC

## 2015-07-15 MED ORDER — TECHNETIUM TC 99M TETROFOSMIN IV KIT
30.0000 | PACK | Freq: Once | INTRAVENOUS | Status: AC | PRN
  Administered 2015-07-15: 30 via INTRAVENOUS

## 2015-07-15 MED ORDER — ENOXAPARIN SODIUM 40 MG/0.4ML ~~LOC~~ SOLN
40.0000 mg | SUBCUTANEOUS | Status: DC
  Administered 2015-07-15: 40 mg via SUBCUTANEOUS
  Filled 2015-07-15: qty 0.4

## 2015-07-15 MED ORDER — PANTOPRAZOLE SODIUM 40 MG PO TBEC
40.0000 mg | DELAYED_RELEASE_TABLET | Freq: Every day | ORAL | Status: DC
  Administered 2015-07-15: 40 mg via ORAL
  Filled 2015-07-15: qty 1

## 2015-07-15 MED ORDER — ALPRAZOLAM 0.25 MG PO TABS: 0.2500 mg | ORAL_TABLET | Freq: Two times a day (BID) | ORAL | Status: DC | PRN

## 2015-07-15 MED ORDER — METOPROLOL TARTRATE 50 MG PO TABS
50.0000 mg | ORAL_TABLET | Freq: Two times a day (BID) | ORAL | Status: DC
  Filled 2015-07-15 (×2): qty 1

## 2015-07-15 MED ORDER — MORPHINE SULFATE (PF) 2 MG/ML IV SOLN: 2.0000 mg | INTRAVENOUS | Status: DC | PRN

## 2015-07-15 MED ORDER — REGADENOSON 0.4 MG/5ML IV SOLN
INTRAVENOUS | Status: AC
  Administered 2015-07-15: 0.4 mg via INTRAVENOUS
  Filled 2015-07-15: qty 5

## 2015-07-15 MED ORDER — REGADENOSON 0.4 MG/5ML IV SOLN
0.4000 mg | Freq: Once | INTRAVENOUS | Status: AC
  Administered 2015-07-15: 0.4 mg via INTRAVENOUS
  Filled 2015-07-15: qty 5

## 2015-07-15 MED ORDER — INSULIN ASPART 100 UNIT/ML ~~LOC~~ SOLN: 0.0000 [IU] | Freq: Every day | SUBCUTANEOUS | Status: DC

## 2015-07-15 MED ORDER — ASPIRIN 81 MG PO CHEW
324.0000 mg | CHEWABLE_TABLET | Freq: Once | ORAL | Status: AC
Start: 2015-07-15 — End: 2015-07-15
  Administered 2015-07-15: 324 mg via ORAL
  Filled 2015-07-15: qty 4

## 2015-07-15 MED ORDER — ASPIRIN 81 MG PO CHEW
81.0000 mg | CHEWABLE_TABLET | Freq: Every day | ORAL | Status: DC
  Administered 2015-07-15: 81 mg via ORAL
  Filled 2015-07-15: qty 1

## 2015-07-15 NOTE — Plan of Care (Signed)
Problem: Phase I Progression Outcomes Goal: Aspirin unless contraindicated Outcome: Completed/Met Date Met:  07/15/15 324 mg of ASA given in ED at 0555 am

## 2015-07-15 NOTE — H&P (Signed)
Fransico Meadow, PA-C Physician Assistant Signed Internal Medicine Progress Notes 07/15/2015 7:44 AM    Expand All Collapse All   Patient ID: Stone Watring, male DOB: [birth year], 65 y.o. MRN: [id number]  [facility] History and Physical  Gleason Berber [id number] DOB: Jun 13, 1950 DOA: 07/15/2015  Referring physician: Billy Fischer PCP: Vikki Ports, MD   Chief Complaint:   HPI: Hyder Bacak is a 65 y.o. male with a Past medical history of CAD, HTN. Type II Diabetes, GERD and Gout. Pt had chest pain last pm starting at 11pm. Pt reports he felt tightness in his chest. Pt reports he attempted to ignore it but pain has continued. He is currently pain free. Pt had a cath in 2012. He had a 20 %blockage in left main. Stent in LAD. 60%diagnal, 70% LCX, [place] to LAD. Cardiology has seen pt and made recommendations for nuclear medicine testing and serial troponins.     Review of Systems:  Constitutional:  No weight loss, night sweats, Fevers, chills, fatigue.  HEENT:  No headaches, Difficulty swallowing,Tooth/dental problems,Sore throat,  No sneezing, itching, ear ache, nasal congestion, post nasal drip,  Cardio-vascular:  No chest pain, Orthopnea, PND, swelling in lower extremities, anasarca, dizziness, palpitations  GI:  No heartburn, indigestion, abdominal pain, nausea, vomiting, diarrhea, change in bowel habits, loss of appetite  Resp:  No shortness of breath with exertion or at rest. No excess mucus, no productive cough, No non-productive cough, No coughing up of blood.No change in color of mucus.No wheezing.No chest wall deformity  Skin:  no rash or lesions.  GU:  no dysuria, change in color of urine, no urgency or frequency. No flank pain.  Musculoskeletal:  No joint pain or swelling. No decreased range of motion. No back pain.  Psych:  No change in mood or affect. No depression or anxiety. No memory loss.   Past Medical History    Diagnosis Date  . DM (diabetes mellitus) ([place])     type II.   [name] GERD (gastroesophageal reflux disease)   . Hyperlipidemia   . Microscopic hematuria     referred to urology 2012  . Myocardial infarction [facility]) 2003    with both PCI, and CABG (Dr. Dorris Carnes)  . BPH (benign prostatic hyperplasia)   . Arthritis   . Anxiety   . Depression   . Hypertension     sees Dr Tera Helper. [place]  . Diverticulosis   . S/P radiation therapy     Received 8 fractions, then stopped due to mucositis with Radiation/ Cisplatin  . BPV (benign positional vertigo) 2012    improved after vestibular rehab  . 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 Serrated adenoma of colon     2013  . Gout attack 2015    right great toe   Past Surgical History  Procedure Laterality Date  . Fracture surgery Right     RT TALAR REPAIR  . Cardiac catheterization  05/2010  . Cardiovascular stress test  07/2009  . Coronary angioplasty  2003; 2006    2 stents in heart  . Coronary artery bypass graft  01/2003    2 vessels  . Cystoscopy  02/09/2011    Procedure: CYSTOSCOPY; Surgeon: Marissa Nestle, MD; Location: [facility]; Service: Urology; Laterality: N/A;  . Colonoscopy  01/2011    polyp; next due 2018.   . Radical neck dissection Left 03/13/2012    Procedure: RADICAL NECK DISSECTION; Surgeon: Leonides Sake  Lucia Gaskins, MD; Location: [place]; Service: ENT; Laterality: Left;  . Tooth extraction N/A 03/13/2012    Procedure: EXTRACTION MOLARS; Surgeon: Rozetta Nunnery, MD; Location: [place]; Service: ENT; Laterality: N/A;  . Mass excision N/A 03/13/2012    Procedure: EXCISION LEFT BUCCAL MUCOSA; Surgeon: Rozetta Nunnery, MD; Location: [place]; Service: ENT; Laterality: N/A;   Social History:  reports that he has never smoked. He  has never used smokeless tobacco. He reports that he does not drink alcohol or use illicit drugs.  No Known Allergies  Family History  Problem Relation Age of Onset  . Stroke Sister   . Anesthesia problems Neg Hx   . Hypotension Neg Hx   . Malignant hyperthermia Neg Hx   . Pseudochol deficiency Neg Hx   . Colon cancer Neg Hx   . Diabetes Brother   . Seizures Mother   . Asthma Father   . Cancer Daughter     thyroid cancer  . Diabetes Sister   . Parkinson's disease Brother   . Heart disease Brother   . Diabetes Brother      Prior to Admission medications   Medication Sig Start Date End Date Taking? Authorizing Provider  aspirin 81 MG tablet Take 81 mg by mouth daily.   Yes Historical Provider, MD  CINNAMON PO Take 600 mg by mouth daily.    Yes Historical Provider, MD  enalapril (VASOTEC) 10 MG tablet Take 1 tablet (10 mg total) by mouth 2 (two) times daily. 04/19/15  Yes Rita Ohara, MD  fluticasone (FLONASE) 50 MCG/ACT nasal spray Place 2 sprays into both nostrils daily. 03/22/15  Yes Rita Ohara, MD  glucose blood test strip Use as instructed 01/20/13  Yes Rita Ohara, MD  Lancets MISC Please test blood sugars daily 01/20/13  Yes Rita Ohara, MD  metFORMIN (GLUCOPHAGE-XR) 500 MG 24 hr tablet Take 2 tablets (1,000 mg total) by mouth daily with breakfast. 01/07/15  Yes Rita Ohara, MD  metoprolol succinate (TOPROL-XL) 25 MG 24 hr tablet Take 1 tablet by mouth daily 06/07/15  Yes Rita Ohara, MD  Multiple Vitamins-Minerals (MULTIVITAMIN WITH MINERALS) tablet Take 1 tablet by mouth 2 (two) times daily.    Yes Historical Provider, MD  naproxen (NAPROSYN) 500 MG tablet Take 1 tablet (500 mg total) by mouth 2 (two) times daily with a meal. 07/08/15  Yes Rita Ohara, MD  nitroGLYCERIN (NITROSTAT) 0.4 MG SL tablet Place 1 tablet (0.4 mg total) under the tongue every 5 (five)  minutes as needed. Chest pain 03/04/12  Yes Rita Ohara, MD  omeprazole (PRILOSEC) 40 MG capsule Take 1 capsule by mouth daily 05/10/15  Yes Rita Ohara, MD  Saw Palmetto, Serenoa repens, 320 MG CAPS Take 1 capsule by mouth 2 (two) times daily.    Yes Historical Provider, MD  Saxagliptin-Metformin (KOMBIGLYZE XR) 2.05-998 MG TB24 Take 1 tablet by mouth at bedtime.   Yes Historical Provider, MD  simvastatin (ZOCOR) 40 MG tablet Take 1 tablet by mouth at bedtime 05/10/15  Yes Rita Ohara, MD   Physical Exam: Filed Vitals:   07/15/15 0326 07/15/15 0500 07/15/15 0630 07/15/15 0710  BP: 190/88 154/76 130/78 129/72  Pulse: 56 58 53 54  Temp: 97.9 F (36.6 C)     TempSrc: Oral     Resp: 17 13 16 18   Height: 5\' 8"  (1.727 m)     Weight: 85.276 kg (188 lb)     SpO2: 100% 98% 98% 100%    Wt Readings from Last  3 Encounters:  07/15/15 85.276 kg (188 lb)  07/08/15 86.728 kg (191 lb 3.2 oz)  04/19/15 85.186 kg (187 lb 12.8 oz)     General: Appears calm and comfortable  Eyes: PERRL, normal lids, irises & conjunctiva  ENT: grossly normal hearing, lips & tongue  Neck: no LAD, masses or thyromegaly  Cardiovascular: RRR, no m/r/g. No LE edema.  Telemetry: SR, no arrhythmias   Respiratory: CTA bilaterally, no w/r/r. Normal respiratory effort.  Abdomen: soft, ntnd  Skin: no rash or induration seen on limited exam  Musculoskeletal: grossly normal tone BUE/BLE  Psychiatric: grossly normal mood and affect, speech fluent and appropriate  Neurologic: grossly non-focal.          Labs on Admission:  Basic Metabolic Panel:  Last Labs      Recent Labs Lab 07/15/15 0334  NA 140  K 3.9  CL 104  CO2 28  GLUCOSE 134*  BUN 16  CREATININE 1.39*  CALCIUM 9.4     Liver Function Tests:  Last Labs     No results for input(s): AST, ALT, ALKPHOS, BILITOT, PROT, ALBUMIN in the last 168  hours.    Last Labs     No results for input(s): LIPASE, AMYLASE in the last 168 hours.    Last Labs     No results for input(s): AMMONIA in the last 168 hours.   CBC:  Last Labs      Recent Labs Lab 07/15/15 0334  WBC 5.3  HGB 12.8*  HCT 39.4  MCV 82.4  PLT 172     Cardiac Enzymes:  Last Labs      Recent Labs Lab 07/15/15 0635  TROPONINI <0.03      BNP (last 3 results)  Recent Labs (within last 365 days)    No results for input(s): BNP in the last 8760 hours.    ProBNP (last 3 results)  Recent Labs (within last 365 days)    No results for input(s): PROBNP in the last 8760 hours.    CBG:  Last Labs     No results for input(s): GLUCAP in the last 168 hours.    Radiological Exams on Admission:  Imaging Results (Last 48 hours)    Dg Chest 2 View  07/15/2015 CLINICAL DATA: 65 year old male with chest pain EXAM: CHEST 2 VIEW COMPARISON: Chest radiograph dated 05/25/2010 FINDINGS: Shallow inspiration with bibasilar dependent atelectatic changes. No focal consolidation, pleural effusion, or pneumothorax. The cardiac silhouette is within normal limits. Median sternotomy wires and CABG vascular clips. No acute osseous pathology. IMPRESSION: No active cardiopulmonary disease. Electronically Signed By: Anner Crete M.D. On: 07/15/2015 04:13     EKG: Independently reviewed.   Assessment/Plan Active Problems:  Chest pain Coronary Artery Disease Hypertension GERD Hyperlipidemia Type IIDiabetes   1. Pt is currently chest pain free,continue nitro, morphine prn if needed. Troponins and Nudlear Medicine test pending 2. Coronary artery disease continue aspirin  3. Hypertension Continue Metoprolol and Vasotec 4. Continue Omeprazole for GERD 5 Hyperlipidemia Change to atorvastatin 80 mg per cardiology recommendation 6. Type II Diabetes, Hold oral medications,  Start SSI. A1C pending  Cardiology consult Dr. Posey Pronto will continue to  follow Code Status: Full code DVT Prophylaxis: Family Communication. Pt's wife at bedside  Time spent: [facility] Pager [phone]

## 2015-07-15 NOTE — Discharge Planning (Signed)
Case Management Note  Patient Details  Name: Gregory Friedman MRN: [id number] Date of Birth: [birth year]  Subjective/Objective:                  65 year old male with history of CAD status post PCI and CABG, hypertension, type 2 diabetes, dyslipidemia, anxiety comes to the emergency department with an episode of chest pressure followed by palpitations./From home spouse.  Action/Plan: Follow for disposition needs.   Expected Discharge Date:  07/16/15               Expected Discharge Plan:  Home/Self Care  In-House Referral:     Discharge planning Services  CM Consult  Post Acute Care Choice:    Choice offered to:     DME Arranged:    DME Agency:     HH Arranged:    HH Agency:     Status of Service:  In process, will continue to follow  If discussed at Long Length of Stay Meetings, dates discussed:    Additional Comments:  Fuller Mandril, RN 07/15/2015, 10:36 AM

## 2015-07-15 NOTE — Consult Note (Signed)
CARDIOLOGY CONSULT NOTE  Assessment and Plan:  *Chest pain/palpitations: Gregory Friedman is a 65 year old male with history of CAD status post PCI and CABG, hypertension, type 2 diabetes, dyslipidemia, anxiety comes to the emergency department with an episode of chest pressure followed by palpitations. His clinical presentation is fairly atypical for to be a type I event. Certainly, demand ischemia is a possibility. However, first set of cardiac biomarkers and EKGs are normal so far. Due to atypical presentation but history of multiple risk factors, we recommend a nuclear medicine perfusion scan to further evaluate ischemic burden. - For now, continue monitoring on telemetry - Continue aspirin 81 mg daily - Increased metoprolol succinate to 50 mg daily - Change simvastatin 40 mg to atorvastatin 80 mg - Continue enalapril 10 mg daily - We'll get a nuclear medicine perfusion scan in the morning  Thank you very much for this very interesting consult. We'll continue to follow along.  Chief complaint: Chest pain   HPI:  Gregory Friedman is a pleasant 65 year old male with history of CAD status post CABG (LIMA to LAD atretic, SVG to OM occluded) hypertension, dyslipidemia, type 2 diabetes, history of squamous cell carcinoma, anxiety, depression is to the emergency department with an episode of palpitations. Patient states that for past 1-2 weeks she's been having intermittent episodes of palpitations. However, tonight was the first time when these palpitations causing to have chest pain that radiated to the back of his head wrapping around to the front. Patient states that he woke up earlier this morning with palpitations, which she describes as fluttering sensation in his chest. After this, he started noticing chest pressure with mild shortness of breath. He denies any diaphoresis. He states that during his palpitation episodes, he is noticed that his blood pressure tends to be high as. Past couple weeks his  blood pressure intermittently has been between 150s to 170s during these episodes. He denies any presyncope or syncope. He had an episode in the emergency department, which was treated successfully with nitroglycerin. Other than that, patient has been doing his routine activities without any angina.  Cardiac history: Previous cardiac imaging  EKG: Sinus bradycardia  TTE: None  NM Stress test: None  Prior cath: 2012: 20% left main, stents to LAD, 60% diagonal, 70% LCx, 50% mid RCA, LIMA to LAD atretic, SVG to OM 100% known to be occluded with LVEF 55%  Past Medical History Past Medical History  Diagnosis Date  . DM (diabetes mellitus) ([place])     type II.   [name] GERD (gastroesophageal reflux disease)   . Hyperlipidemia   . Microscopic hematuria     referred to urology 2012  . Myocardial infarction [facility]) 2003    with both PCI, and CABG (Dr. Dorris Carnes)  . BPH (benign prostatic hyperplasia)   . Arthritis   . Anxiety   . Depression   . Hypertension     sees Dr  Tera Helper. [place]  . Diverticulosis   . S/P radiation therapy     Received 8 fractions, then stopped due to mucositis with Radiation/ Cisplatin  . BPV (benign positional vertigo) 2012    improved after vestibular rehab  . 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 Serrated adenoma of colon     2013  . Gout attack 2015    right great toe    Allergies: No Known Allergies  Social History Social History   Social History  .  Marital Status: Married    Spouse Name: N/A  . Number of Children: 3  . Years of Education: N/A   Occupational History  . 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 Smoking status: Never Smoker   . Smokeless tobacco: Never Used  . Alcohol Use: No  . Drug Use: No  . Sexual Activity: Not on file   Other Topics Concern  . Not on file   Social History Narrative   Married;  2 children in [place], son in [place]; 6 grandchildren           Family History Family History  Problem Relation Age of Onset  . Stroke Sister   . Anesthesia problems Neg Hx   . Hypotension Neg Hx   . Malignant hyperthermia Neg Hx   . Pseudochol deficiency Neg Hx   . Colon cancer Neg Hx   . Diabetes Brother   . Seizures Mother   . Asthma Father   . Cancer Daughter     thyroid cancer  . Diabetes Sister   . Parkinson's disease Brother   . Heart disease Brother   . Diabetes Brother     Physical Exam Filed Vitals:   07/15/15 0326 07/15/15 0500  BP: 190/88 154/76  Pulse: 56 58  Temp: 97.9 F (36.6 C)   Resp: 17 13    [id number] appearing in no acute distress HEENT: Extraocular motions intact, pupils equal round react to light, poor dentition Neck: Supple, no JVD, no carotid bruits CV: Bradycardic, normal S1, normal S2, no obvious murmurs, +2 pulses in bilateral upper extremities Pulm: Clear to auscultation bilaterally normal breathing no accessory muscle use Abdomen: Soft, nontender, nondistended Ext: No cyanosis clubbing or edema  Labs:  Results for orders placed or performed during the hospital encounter of 07/15/15 (from the past 24 hour(s))  Basic metabolic panel     Status: Abnormal   Collection Time: 07/15/15  3:34 AM  Result Value Ref Range   Sodium 140 135 - 145 mmol/L   Potassium 3.9 3.5 - 5.1 mmol/L   Chloride 104 101 - 111 mmol/L   CO2 28 22 - 32 mmol/L   Glucose, Bld 134 (H) 65 - 99 mg/dL   BUN 16 6 - 20 mg/dL   Creatinine, Ser 1.39 (H) 0.61 - 1.24 mg/dL   Calcium 9.4 8.9 - 10.3 mg/dL   GFR calc non Af Amer 52 (L) >60 mL/min   GFR calc Af Amer 60 (L) >60 mL/min   Anion gap 8 5 - 15  CBC     Status: Abnormal   Collection Time: 07/15/15  3:34 AM  Result Value Ref Range   WBC 5.3 4.0 - 10.5 K/uL   RBC 4.78 4.22 - 5.81 MIL/uL   Hemoglobin 12.8 (L) 13.0 - 17.0 g/dL   HCT 39.4 39.0 - 52.0 %   MCV 82.4 78.0 - 100.0 fL   MCH 26.8 26.0 - 34.0 pg   MCHC 32.5 30.0 - 36.0 g/dL   RDW 13.1 11.5 - 15.5 %    Platelets 172 150 - 400 K/uL  I-stat troponin, ED     Status: None   Collection Time: 07/15/15  3:39 AM  Result Value Ref Range   Troponin i, poc 0.00 0.00 - 0.08 ng/mL   Comment 3

## 2015-07-15 NOTE — Discharge Summary (Signed)
Physician Discharge Summary  Gregory Friedman [id number] DOB: November 01, 1950 DOA: 07/15/2015  PCP: Vikki Ports, MD  Admit date: 07/15/2015 Discharge date: 07/15/2015    Recommendations for Outpatient Follow-up:  1. See the Cardiologist as scheduled Continue current home medications  Discharge Diagnoses:  Active Problems:   Mixed hyperlipidemia   HYPERTENSION, BENIGN   Coronary atherosclerosis   GERD (gastroesophageal reflux disease)   Chest pain   Diabetes mellitus, without long-term current use of insulin (HCC)   Pain in the chest   HLD (hyperlipidemia)   S/P CABG (coronary artery bypass graft)   Discharge Condition: stable  Diet recommendation: heart healthy  Filed Weights   07/15/15 0326 07/15/15 1123  Weight: 85.276 kg (188 lb) 86.864 kg (191 lb 8 oz)    History of present illness:   HPI: Gregory Friedman is a 65 y.o. male with a Past medical history of CAD, HTN. Type II Diabetes, GERD and Gout. Pt had chest pain last pm starting at 11pm. Pt reports he felt tightness in his chest. Pt reports he attempted to ignore it but pain has continued. He is currently pain free. Pt had a cath in 2012. He had a 20 %blockage in left main. Stent in LAD. 60%diagnal, 70% LCX, [place] to LAD. Cardiology has seen pt and made recommendations for nuclear medicine testing and serial troponins.  Pt reports he feels well now.  He is not having any current chest pain.    Hospital Course:  Pt had serial troponins  which were negative,  Pt had a nuclear medicine stress test which was interpreted by cardiology.  The cardiologist reports stress test shows no acute changes. Cardiology feels patient is stable for discharge with follow up with cardiology.   Procedures: NM Stress test showed apical infarct. EF of 47% per radiologist.  Cardiology reviewed and reported chronic changes only.     Consultations:  Dr. Marlou Porch cardiology   Discharge Exam: Filed Vitals:   07/15/15 1123 07/15/15  1257  BP: 142/73 131/74  Pulse: 58 60  Temp: 98.7 F (37.1 C)   Resp:      General: pt is comfortable.  Pt feels well.  Cardiovascular: RRR  Respiratory: normal   Discharge Instructions    Current Discharge Medication List    CONTINUE these medications which have NOT CHANGED   Details  aspirin 81 MG tablet Take 81 mg by mouth daily.    CINNAMON PO Take 600 mg by mouth daily.     enalapril (VASOTEC) 10 MG tablet Take 1 tablet (10 mg total) by mouth 2 (two) times daily. Qty: 180 tablet, Refills: 1   Associated Diagnoses: Essential hypertension, benign    fluticasone (FLONASE) 50 MCG/ACT nasal spray Place 2 sprays into both nostrils daily. Qty: 48 g, Refills: 0    glucose blood test strip Use as instructed Qty: 100 each, Refills: prn   Associated Diagnoses: Type II or unspecified type diabetes mellitus without mention of complication, not stated as uncontrolled    Lancets MISC Please test blood sugars daily Qty: 100 each, Refills: prn   Associated Diagnoses: Type II or unspecified type diabetes mellitus without mention of complication, not stated as uncontrolled    metFORMIN (GLUCOPHAGE-XR) 500 MG 24 hr tablet Take 2 tablets (1,000 mg total) by mouth daily with breakfast. Qty: 180 tablet, Refills: 1   Associated Diagnoses: Diabetes mellitus without complication (HCC)    metoprolol succinate (TOPROL-XL) 25 MG 24 hr tablet Take 1 tablet by mouth  daily Qty: 90 tablet, Refills:  0    Multiple Vitamins-Minerals (MULTIVITAMIN WITH MINERALS) tablet Take 1 tablet by mouth 2 (two) times daily.     naproxen (NAPROSYN) 500 MG tablet Take 1 tablet (500 mg total) by mouth 2 (two) times daily with a meal. Qty: 30 tablet, Refills: 0   Associated Diagnoses: Thumb pain, right    nitroGLYCERIN (NITROSTAT) 0.4 MG SL tablet Place 1 tablet (0.4 mg total) under the tongue every 5 (five) minutes as needed. Chest pain Qty: 25 tablet, Refills: 1   Associated Diagnoses: CAD (coronary artery  disease)    omeprazole (PRILOSEC) 40 MG capsule Take 1 capsule by mouth  daily Qty: 90 capsule, Refills: 0    Saw Palmetto, Serenoa repens, 320 MG CAPS Take 1 capsule by mouth 2 (two) times daily.     Saxagliptin-Metformin (KOMBIGLYZE XR) 2.05-998 MG TB24 Take 1 tablet by mouth at bedtime.    simvastatin (ZOCOR) 40 MG tablet Take 1 tablet by mouth at  bedtime Qty: 90 tablet, Refills: 0       No Known Allergies Follow-up Information    Follow up with Angelena Form, PA-C On 08/19/2015.   Specialties:  Cardiology, Radiology   Why:  @ 10:30am   Contact information:   [address] [place] [id number] [phone]        The results of significant diagnostics from this hospitalization (including imaging, microbiology, ancillary and laboratory) are listed below for reference.    Significant Diagnostic Studies: Dg Chest 2 View  07/15/2015  CLINICAL DATA:  65 year old male with chest pain EXAM: CHEST  2 VIEW COMPARISON:  Chest radiograph dated 05/25/2010 FINDINGS: Shallow inspiration with bibasilar dependent atelectatic changes. No focal consolidation, pleural effusion, or pneumothorax. The cardiac silhouette is within normal limits. Median sternotomy wires and CABG vascular clips. No acute osseous pathology. IMPRESSION: No active cardiopulmonary disease. Electronically Signed   By: Anner Crete M.D.   On: 07/15/2015 04:13   Nm Myocar Multi W/spect W/wall Motion / Ef  07/15/2015  CLINICAL DATA:  Atypical chest pain with shortness of breath. History of myocardial infarction and CABG (2005) EXAM: MYOCARDIAL IMAGING WITH SPECT (REST AND PHARMACOLOGIC-STRESS) GATED LEFT VENTRICULAR WALL MOTION STUDY LEFT VENTRICULAR EJECTION FRACTION TECHNIQUE: Standard myocardial SPECT imaging was performed after resting intravenous injection of 10 mCi Tc-36m tetrofosmin. Subsequently, intravenous infusion of Lexiscan was performed under the supervision of the Cardiology staff. At peak  effect of the drug, 30 mCi Tc-63m tetrofosmin was injected intravenously and standard myocardial SPECT imaging was performed. Quantitative gated imaging was also performed to evaluate left ventricular wall motion, and estimate left ventricular ejection fraction. COMPARISON:  Chest radiograph 07/15/2015.  PET-CT 03/08/2012. FINDINGS: Perfusion: There is a large, predominately fixed perfusion defect involving the apex and distal anterior and septal segments. There is no definite surrounding reversibility to suggest peri-infarct ischemia. The left ventricular activity is otherwise normal. Wall Motion: Apical hypokinesis corresponding with apical infarct. Left Ventricular Ejection Fraction: 47 % End diastolic volume 123456 ml End systolic volume 59 ml IMPRESSION: 1. Apical infarct without definite peri-infarct ischemia. 2. Associated apical hypokinesis. 3. Left ventricular ejection fraction 47% 4. Non invasive risk stratification*: Intermediate *2012 Appropriate Use Criteria for Coronary Revascularization Focused Update: J Am Coll Cardiol. [id number]. [email or web address] Electronically Signed   By: Richardean Sale M.D.   On: 07/15/2015 13:28    Microbiology: No results found for this or any previous visit (from the past 240 hour(s)).   Labs: Basic Metabolic Panel:  Recent Labs Lab 07/15/15 [phone]  NA 140  K 3.9  CL 104  CO2 28  GLUCOSE 134*  BUN 16  CREATININE 1.39*  CALCIUM 9.4   Liver Function Tests: No results for input(s): AST, ALT, ALKPHOS, BILITOT, PROT, ALBUMIN in the last 168 hours. No results for input(s): LIPASE, AMYLASE in the last 168 hours. No results for input(s): AMMONIA in the last 168 hours. CBC:  Recent Labs Lab 07/15/15 0334  WBC 5.3  HGB 12.8*  HCT 39.4  MCV 82.4  PLT 172   Cardiac Enzymes:  Recent Labs Lab 07/15/15 0635 07/15/15 1117  TROPONINI <0.03 <0.03   BNP: BNP (last 3 results) No results for input(s):  BNP in the last 8760 hours.  ProBNP (last 3 results) No results for input(s): PROBNP in the last 8760 hours.  CBG:  Recent Labs Lab 07/15/15 1323 07/15/15 1639  GLUCAP 211* 145*       Signed:  SOFIA,KAREN PA-C.  [facility] 07/15/2015, 4:51 PM

## 2015-07-15 NOTE — Progress Notes (Signed)
Patient Name: Gregory Friedman Date of Encounter: 07/15/2015     Active Problems:   Mixed hyperlipidemia   HYPERTENSION, BENIGN   Coronary atherosclerosis   GERD (gastroesophageal reflux disease)   Chest pain   Diabetes mellitus, without long-term current use of insulin ([place])    SUBJECTIVE  Seen in nuc med for stress test. No chest pain currently. No more palpitations.   CURRENT MEDS . aspirin  81 mg Oral Daily  . atorvastatin  80 mg Oral q1800  . enoxaparin (LOVENOX) injection  40 mg Subcutaneous Q24H  . insulin aspart  0-5 Units Subcutaneous QHS  . insulin aspart  0-9 Units Subcutaneous TID WC  . metoprolol tartrate  50 mg Oral BID  . pantoprazole  40 mg Oral Daily  . regadenoson  0.4 mg Intravenous Once    OBJECTIVE  Filed Vitals:   07/15/15 0500 07/15/15 0630 07/15/15 0710 07/15/15 0904  BP: 154/76 130/78 129/72 175/85  Pulse: 58 53 54   Temp:      TempSrc:      Resp: 13 16 18    Height:      Weight:      SpO2: 98% 98% 100%    No intake or output data in the 24 hours ending 07/15/15 0935 Filed Weights   07/15/15 0326  Weight: 188 lb (85.276 kg)    PHYSICAL EXAM  General: Pleasant, NAD. Neuro: Alert and oriented X 3. Moves all extremities spontaneously. Psych: Normal affect. HEENT:  Normal  Neck: Supple without bruits or JVD. Lungs:  Resp regular and unlabored, CTA. Heart: RRR no s3, s4, or murmurs. Abdomen: Soft, non-tender, non-distended, BS + x 4.  Extremities: No clubbing, cyanosis or edema. DP/PT/Radials 2+ and equal bilaterally.  Accessory Clinical Findings  CBC  Recent Labs  07/15/15 0334  WBC 5.3  HGB 12.8*  HCT 39.4  MCV 82.4  PLT [id number]   Basic Metabolic Panel  Recent Labs  07/15/15 0334  NA 140  K 3.9  CL 104  CO2 28  GLUCOSE 134*  BUN 16  CREATININE 1.39*  CALCIUM 9.4   Liver Function Tests No results for input(s): AST, ALT, ALKPHOS, BILITOT, PROT, ALBUMIN in the last 72 hours. No results for input(s): LIPASE,  AMYLASE in the last 72 hours. Cardiac Enzymes  Recent Labs  07/15/15 0635  TROPONINI <0.03    TELE  NSR while observed in nuc med.   Radiology/Studies  Dg Chest 2 View  07/15/2015  CLINICAL DATA:  65 year old male with chest pain EXAM: CHEST  2 VIEW COMPARISON:  Chest radiograph dated 05/25/2010 FINDINGS: Shallow inspiration with bibasilar dependent atelectatic changes. No focal consolidation, pleural effusion, or pneumothorax. The cardiac silhouette is within normal limits. Median sternotomy wires and CABG vascular clips. No acute osseous pathology. IMPRESSION: No active cardiopulmonary disease. Electronically Signed   By: Anner Crete M.D.   On: 07/15/2015 04:13   Prior cath: 2012: 20% left main, stents to LAD, 60% diagonal, 70% LCx, 50% mid RCA, LIMA to LAD atretic, SVG to OM 100% known to be occluded with LVEF 55%   ASSESSMENT AND PLAN  [name] is a 65 year old male with history of CAD s/p PCI and CABG (2005 in [place]), hypertension, type 2 diabetes, dyslipidemia, and anxiety who presented to the [facility] ED on 07/15/15 with an episode of chest pressure followed by palpitations.   Chest pain: trop neg x1. ECG with no acute ST or TW changes. Chest pain felt to be atypical but non invasive  ischemic eval recommended. Seen in nuc med today. Await images.   CAD s/p CABG (2005 in [place]): he underwent cardiac cath in 2012 which showed LIMA to LAD atretic, SVG to OM occluded. There were multiple areas which could be causing chest pain or ischemia ( severe disease in distal LCx or mod disease in diag). Dr. Julianne Handler recommended medical management at that time with follow up nuc to try to define areas of ischemia. I don't see that this was ever done. He has not been seen by his primary cardiologist, Dr. Harrington Challenger for quite some time. Continue ASA and statin and BB   Palpitations: NSR by tele in nuc med. Continue to monitor.   HTN: BP has been elevated. Started on Lopressor 50mg  BID but  this has not been given yet. Monitor closely since his HR at baseline is in low 50s .  Signed, Angelena Form PA-C  Pager [phone]  Personally seen and examined. Agree with above. Personally looked at [facility] stress. 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 No ischemia. OK with DC home.   Candee Furbish, MD

## 2015-07-15 NOTE — Discharge Instructions (Signed)
Continue your current home medications. Follow up with your primary care MD for recheck.   You have an appointment scheduled to see the Cardiologist for recheck.  Make sure you take your medications and continue a heart healthy diet    Chest Pain Observation It is often hard to give a specific diagnosis for the cause of chest pain. Among other possibilities your symptoms might be caused by inadequate oxygen delivery to your heart (angina). Angina that is not treated or evaluated can lead to a heart attack (myocardial infarction) or death. Blood tests, electrocardiograms, and X-rays may have been done to help determine a possible cause of your chest pain. After evaluation and observation, your health care provider has determined that it is unlikely your pain was caused by an unstable condition that requires hospitalization. However, a full evaluation of your pain may need to be completed, with additional diagnostic testing as directed. It is very important to keep your follow-up appointments. Not keeping your follow-up appointments could result in permanent heart damage, disability, or death. If there is any problem keeping your follow-up appointments, you must call your health care provider. HOME CARE INSTRUCTIONS  Due to the slight chance that your pain could be angina, it is important to follow your health care provider's treatment plan and also maintain a healthy lifestyle:  Maintain or work toward achieving a healthy weight.  Stay physically active and exercise regularly.  Decrease your salt intake.  Eat a balanced, healthy diet. Talk to a dietitian to learn about heart-healthy foods.  Increase your fiber intake by including whole grains, vegetables, fruits, and nuts in your diet.  Avoid situations that cause stress, anger, or depression.  Take medicines as advised by your health care provider. Report any side effects to your health care provider. Do not stop medicines or adjust the dosages  on your own.  Quit smoking. Do not use nicotine patches or gum until you check with your health care provider.  Keep your blood pressure, blood sugar, and cholesterol levels within normal limits.  Limit alcohol intake to no more than 1 drink per day for women who are not pregnant and 2 drinks per day for men.  Do not abuse drugs. SEEK IMMEDIATE MEDICAL CARE IF: You have severe chest pain or pressure which may include symptoms such as:  You feel pain or pressure in your arms, neck, jaw, or back.  You have severe back or abdominal pain, feel sick to your stomach (nauseous), or throw up (vomit).  You are sweating profusely.  You are having a fast or irregular heartbeat.  You feel short of breath while at rest.  You notice increasing shortness of breath during rest, sleep, or with activity.  You have chest pain that does not get better after rest or after taking your usual medicine.  You wake from sleep with chest pain.  You are unable to sleep because you cannot breathe.  You develop a frequent cough or you are coughing up blood.  You feel dizzy, faint, or experience extreme fatigue.  You develop severe weakness, dizziness, fainting, or chills. Any of these symptoms may represent a serious problem that is an emergency. Do not wait to see if the symptoms will go away. Call your local emergency services (911 in the [place] Do not drive yourself to the hospital. MAKE SURE YOU:  Understand these instructions.  Will watch your condition.  Will get help right away if you are not doing well or get worse.  This information is not intended to replace advice given to you by your health care provider. Make sure you discuss any questions you have with your health care provider.   Document Released: 02/11/2010 Document Revised: 01/14/2013 Document Reviewed: 07/11/2012 Elsevier Interactive Patient Education [employer].

## 2015-07-15 NOTE — ED Provider Notes (Signed)
CSN: [id number]     Arrival date & time 07/15/15  [id number] History   First MD Initiated Contact with Patient 07/15/15 [phone]     Chief Complaint  Patient presents with  . Chest Pain     (Consider location/radiation/quality/duration/timing/severity/associated sxs/prior Treatment) HPI   Hx of MI, CABG with similar symptoms had prior  Intermittent chest pain over last month, today at 11PM developed worsening pain, pressure, left side, pressure left ear and left headache, throbbing left ear and head, SOB, nausea,.   Has had episodes of diaphoresis, however not with CP.  No exertional although not exerting a lot.  Worse laying down.  Also more short of breath laying down.   Past Medical History  Diagnosis Date  . DM (diabetes mellitus) ([place])     type II.   [name] GERD (gastroesophageal reflux disease)   . Hyperlipidemia   . Microscopic hematuria     referred to urology 2012  . Myocardial infarction [facility]) 2003    with both PCI, and CABG (Dr. Dorris Carnes)  . BPH (benign prostatic hyperplasia)   . Arthritis   . Anxiety   . Depression   . Hypertension     sees Dr  Tera Helper. [place]  . Diverticulosis   . S/P radiation therapy     Received 8 fractions, then stopped due to mucositis with Radiation/ Cisplatin  . BPV (benign positional vertigo) 2012    improved after vestibular rehab  . 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 Serrated adenoma of colon     2013  . Gout attack 2015    right great toe   Past Surgical History  Procedure Laterality Date  . Fracture surgery Right     RT TALAR REPAIR  . Cardiac catheterization  05/2010  . Cardiovascular stress test  07/2009  . Coronary angioplasty  2003; 2006    2 stents in heart  . Coronary artery bypass graft  01/2003    2 vessels  . Cystoscopy  02/09/2011    Procedure: CYSTOSCOPY;  Surgeon: Marissa Nestle, MD;  Location: [facility];  Service: Urology;  Laterality: N/A;  . Colonoscopy  01/2011    polyp;  next due 2018.   . Radical neck dissection Left 03/13/2012    Procedure: RADICAL NECK DISSECTION;  Surgeon: Rozetta Nunnery, MD;  Location: [place];  Service: ENT;  Laterality: Left;  . Tooth extraction N/A 03/13/2012    Procedure: EXTRACTION MOLARS;  Surgeon: Rozetta Nunnery, MD;  Location: [place];  Service: ENT;  Laterality: N/A;  . Mass excision N/A 03/13/2012    Procedure: EXCISION LEFT BUCCAL MUCOSA;  Surgeon: Rozetta Nunnery, MD;  Location: [facility];  Service: ENT;  Laterality: N/A;   Family History  Problem Relation Age of Onset  . Stroke Sister   . Anesthesia problems Neg Hx   . Hypotension Neg Hx   . Malignant hyperthermia Neg Hx   . Pseudochol deficiency Neg Hx   . Colon cancer Neg Hx   . Diabetes Brother   . Seizures Mother   . Asthma Father   . Cancer Daughter     thyroid cancer  . Diabetes Sister   . Parkinson's disease Brother   . Heart disease Brother   . Diabetes Brother    Social History  Substance Use Topics  . Smoking status: Never Smoker   . Smokeless tobacco: Never Used  . Alcohol Use: No  Review of Systems  Constitutional: Negative for fever.  HENT: Negative for sore throat.   Eyes: Negative for visual disturbance.  Respiratory: Positive for shortness of breath.   Cardiovascular: Positive for chest pain.  Gastrointestinal: Positive for nausea. Negative for vomiting, abdominal pain and diarrhea.  Genitourinary: Negative for dysuria and difficulty urinating.  Musculoskeletal: Negative for back pain and neck stiffness.  Skin: Negative for rash.  Neurological: Positive for headaches. Negative for syncope, weakness and numbness.      Allergies  Review of patient's allergies indicates no known allergies.  Home Medications   Prior to Admission medications   Medication Sig Start Date End Date Taking? Authorizing Provider  aspirin 81 MG tablet Take 81 mg by mouth daily.   Yes Historical Provider, MD  CINNAMON PO Take 600 mg by mouth daily.     Yes Historical Provider, MD  enalapril (VASOTEC) 10 MG tablet Take 1 tablet (10 mg total) by mouth 2 (two) times daily. 04/19/15  Yes Rita Ohara, MD  fluticasone (FLONASE) 50 MCG/ACT nasal spray Place 2 sprays into both nostrils daily. 03/22/15  Yes Rita Ohara, MD  glucose blood test strip Use as instructed 01/20/13  Yes Rita Ohara, MD  Lancets MISC Please test blood sugars daily 01/20/13  Yes Rita Ohara, MD  metFORMIN (GLUCOPHAGE-XR) 500 MG 24 hr tablet Take 2 tablets (1,000 mg total) by mouth daily with breakfast. 01/07/15  Yes Rita Ohara, MD  metoprolol succinate (TOPROL-XL) 25 MG 24 hr tablet Take 1 tablet by mouth  daily 06/07/15  Yes Rita Ohara, MD  Multiple Vitamins-Minerals (MULTIVITAMIN WITH MINERALS) tablet Take 1 tablet by mouth 2 (two) times daily.    Yes Historical Provider, MD  naproxen (NAPROSYN) 500 MG tablet Take 1 tablet (500 mg total) by mouth 2 (two) times daily with a meal. 07/08/15  Yes Rita Ohara, MD  nitroGLYCERIN (NITROSTAT) 0.4 MG SL tablet Place 1 tablet (0.4 mg total) under the tongue every 5 (five) minutes as needed. Chest pain 03/04/12  Yes Rita Ohara, MD  omeprazole (PRILOSEC) 40 MG capsule Take 1 capsule by mouth  daily 05/10/15  Yes Rita Ohara, MD  Saw Palmetto, Serenoa repens, 320 MG CAPS Take 1 capsule by mouth 2 (two) times daily.    Yes Historical Provider, MD  simvastatin (ZOCOR) 40 MG tablet Take 1 tablet by mouth at  bedtime 05/10/15  Yes Rita Ohara, MD   BP 131/74 mmHg  Pulse 60  Temp(Src) 98.7 F (37.1 C) (Oral)  Resp 18  Ht 5\' 8"  (1.727 m)  Wt 191 lb 8 oz (86.864 kg)  BMI 29.12 kg/m2  SpO2 96% Physical Exam  Constitutional: He is oriented to person, place, and time. He appears well-developed and well-nourished. No distress.  HENT:  Head: Normocephalic and atraumatic.  Eyes: Conjunctivae and EOM are normal.  Neck: Normal range of motion.  Cardiovascular: Normal rate, regular rhythm, normal heart sounds and intact distal pulses.  Exam reveals no gallop and no  friction rub.   No murmur heard. Pulmonary/Chest: Effort normal and breath sounds normal. No respiratory distress. He has no wheezes. He has no rales.  Abdominal: Soft. He exhibits no distension. There is no tenderness. There is no guarding.  Musculoskeletal: He exhibits no edema.  Neurological: He is alert and oriented to person, place, and time.  Skin: Skin is warm and dry. He is not diaphoretic.  Nursing note and vitals reviewed.   ED Course  Procedures (including critical care time) Labs Review Labs Reviewed  BASIC METABOLIC PANEL - Abnormal; Notable for the following:    Glucose, Bld 134 (*)    Creatinine, Ser 1.39 (*)    GFR calc non Af Amer 52 (*)    GFR calc Af Amer 60 (*)    All other components within normal limits  CBC - Abnormal; Notable for the following:    Hemoglobin 12.8 (*)    All other components within normal limits  GLUCOSE, CAPILLARY - Abnormal; Notable for the following:    Glucose-Capillary 211 (*)    All other components within normal limits  GLUCOSE, CAPILLARY - Abnormal; Notable for the following:    Glucose-Capillary 145 (*)    All other components within normal limits  TROPONIN I  TROPONIN I  TROPONIN I  HEMOGLOBIN A1C  I-STAT TROPOININ, ED    Imaging Review Dg Chest 2 View  07/15/2015  CLINICAL DATA:  65 year old male with chest pain EXAM: CHEST  2 VIEW COMPARISON:  Chest radiograph dated 05/25/2010 FINDINGS: Shallow inspiration with bibasilar dependent atelectatic changes. No focal consolidation, pleural effusion, or pneumothorax. The cardiac silhouette is within normal limits. Median sternotomy wires and CABG vascular clips. No acute osseous pathology. IMPRESSION: No active cardiopulmonary disease. Electronically Signed   By: Anner Crete M.D.   On: 07/15/2015 04:13   Nm Myocar Multi W/spect W/wall Motion / Ef  07/15/2015  CLINICAL DATA:  Atypical chest pain with shortness of breath. History of myocardial infarction and CABG (2005) EXAM:  MYOCARDIAL IMAGING WITH SPECT (REST AND PHARMACOLOGIC-STRESS) GATED LEFT VENTRICULAR WALL MOTION STUDY LEFT VENTRICULAR EJECTION FRACTION TECHNIQUE: Standard myocardial SPECT imaging was performed after resting intravenous injection of 10 mCi Tc-26m tetrofosmin. Subsequently, intravenous infusion of Lexiscan was performed under the supervision of the Cardiology staff. At peak effect of the drug, 30 mCi Tc-35m tetrofosmin was injected intravenously and standard myocardial SPECT imaging was performed. Quantitative gated imaging was also performed to evaluate left ventricular wall motion, and estimate left ventricular ejection fraction. COMPARISON:  Chest radiograph 07/15/2015.  PET-CT 03/08/2012. FINDINGS: Perfusion: There is a large, predominately fixed perfusion defect involving the apex and distal anterior and septal segments. There is no definite surrounding reversibility to suggest peri-infarct ischemia. The left ventricular activity is otherwise normal. Wall Motion: Apical hypokinesis corresponding with apical infarct. Left Ventricular Ejection Fraction: 47 % End diastolic volume 123456 ml End systolic volume 59 ml IMPRESSION: 1. Apical infarct without definite peri-infarct ischemia. 2. Associated apical hypokinesis. 3. Left ventricular ejection fraction 47% 4. Non invasive risk stratification*: Intermediate *2012 Appropriate Use Criteria for Coronary Revascularization Focused Update: J Am Coll Cardiol. [id number]. http://[email or web address].aspx?[email or web address] Electronically Signed   By: Richardean Sale M.D.   On: 07/15/2015 13:28   I have personally reviewed and evaluated these images and lab results as part of my medical decision-making.   EKG Interpretation   Date/Time:  Thursday July 15 2015 03:27:16 EDT Ventricular Rate:  54 PR Interval:  138 QRS Duration: 86 QT Interval:  430 QTC Calculation: 407 R Axis:     Text Interpretation:  Sinus bradycardia Septal infarct , age  undetermined  Abnormal ECG No significant change since last tracing Confirmed by  [facility] MD, [place] ([id number]) on 07/15/2015 5:22:46 AM      MDM   Final diagnoses:  Chest pain   65yo male with history of CAD with hx multiple stents and CABG, DM, htn, hlpd presents with concern for chest pain.  No tachypnea, no hypoxia, feel PE less likely cause of symptoms.  Symptoms consistent with prior  anginal symptoms.  EKG unchanged, troponin negative. Given aspirin. Consulted cardiology given history of CAD with symptoms concerning for angina. Will admit to Hospitalist. Pt with episode of CP in ED with repeat ECG without changes.     Gareth Morgan, MD 07/15/15 [phone]

## 2015-07-15 NOTE — ED Notes (Signed)
Patient presents stating he has a history of palpitations but tonight about 1 1/2 hours ago  he started with pressure to the left center of chest.

## 2015-07-15 NOTE — Plan of Care (Signed)
Problem: Consults Goal: Skin Care Protocol Initiated - if Braden Score 18 or less If consults are not indicated, leave blank or document N/A  Outcome: Not Applicable Date Met:  15/05/69 braden > 18

## 2015-07-16 LAB — HEMOGLOBIN A1C
HEMOGLOBIN A1C: 7.2 % — AB (ref 4.8–5.6)
Mean Plasma Glucose: 160 mg/dL

## 2015-07-28 ENCOUNTER — Encounter: Payer: Self-pay | Admitting: Family Medicine

## 2015-07-28 ENCOUNTER — Ambulatory Visit
Admission: RE | Admit: 2015-07-28 | Discharge: 2015-07-28 | Disposition: A | Discharge status: Home / Self Care | Payer: Medicare Other | Source: Ambulatory Visit | Attending: Family Medicine | Admitting: Family Medicine

## 2015-07-28 ENCOUNTER — Ambulatory Visit (INDEPENDENT_AMBULATORY_CARE_PROVIDER_SITE_OTHER): Payer: Medicare Other | Admitting: Family Medicine

## 2015-07-28 VITALS — BP 128/82 | HR 68 | Ht 68.25 in | Wt 191.0 lb

## 2015-07-28 DIAGNOSIS — M79644 Pain in right finger(s): Secondary | ICD-10-CM

## 2015-07-28 DIAGNOSIS — Z125 Encounter for screening for malignant neoplasm of prostate: Secondary | ICD-10-CM | POA: Diagnosis not present

## 2015-07-28 NOTE — Progress Notes (Signed)
Chief Complaint  Patient presents with  . Follow-up    on thumb pain and labs (had A1c 07/15/15 @ [facility]).    Patient presents to f/u on right thumb pain.  This was discussed at his AW[facility] on 6/15.  At that time it had been hurting for a week. Pain is at the base of the thumb. 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  DDx was tendonitis/strain, degenerative changes, gout (least likely). He was prescribed naproxen x 2 weeks.  He took it for the full 2 week course, and it was helpful.  Swelling got better, but pain/swelling recurred after running out of medication.  No GI side effects to the Naproxen.  It hurts to play the guitar, so hasn't been playing much. He reports his thumb "locks up", usually at night, and this is painful. It gets stuck in the hyperextended position, then "pops" back into place, and is painful. He can't open jars due to the pain.  He was also supposed to have nonfasting labs drawn today (was too early to be drawn at his recent visit).  Some of these were down during his recent hospitalization. He was hospitalized 6/22 overnight for chest pain. He had negative serial troponins.  Pt had a cath in 2012. He had a 20 %blockage in left main. Stent in LAD. 60%diagnal, 70% LCX, [place] to LAD.   NM Stress test showed apical infarct. EF of 47% per radiologist. Cardiology reviewed and reported chronic changes only.  No changes were made to his medical regimen, and is scheduled to f/u on 7/27. Denies any recurrent chest pain since being discharged.  PMH, PSH, SH reviewed.  Outpatient Encounter Prescriptions as of 07/28/2015  Medication Sig  . aspirin 81 MG tablet Take 81 mg by mouth daily.  [name] CINNAMON PO Take 600 mg by mouth daily.   . enalapril (VASOTEC) 10 MG tablet Take 1 tablet (10 mg total) by mouth 2 (two) times daily.  . fluticasone (FLONASE) 50 MCG/ACT nasal spray Place 2 sprays into both nostrils daily.  [name] glucose blood test strip Use as  instructed  . Lancets MISC Please test blood sugars daily  . metFORMIN (GLUCOPHAGE-XR) 500 MG 24 hr tablet Take 2 tablets (1,000 mg total) by mouth daily with breakfast.  . metoprolol succinate (TOPROL-XL) 25 MG 24 hr tablet Take 1 tablet by mouth  daily  . Multiple Vitamins-Minerals (MULTIVITAMIN WITH MINERALS) tablet Take 1 tablet by mouth 2 (two) times daily.   [name] omeprazole (PRILOSEC) 40 MG capsule Take 1 capsule by mouth  daily  . Saw Palmetto, Serenoa repens, 320 MG CAPS Take 1 capsule by mouth 2 (two) times daily.   . Saxagliptin-Metformin 2.05-998 MG TB24 Take 1 tablet by mouth daily.  . simvastatin (ZOCOR) 40 MG tablet Take 1 tablet by mouth at  bedtime  . naproxen (NAPROSYN) 500 MG tablet Take 1 tablet (500 mg total) by mouth 2 (two) times daily with a meal. (Patient not taking: Reported on 07/28/2015)  . nitroGLYCERIN (NITROSTAT) 0.4 MG SL tablet Place 1 tablet (0.4 mg total) under the tongue every 5 (five) minutes as needed. Chest pain (Patient not taking: Reported on 07/28/2015)   No facility-administered encounter medications on file as of 07/28/2015.   No Known Allergies  ROs: no headaches, dizziness, fever, chills, GI complaints, chest pain, palpitations, shortness of breath.  +thumb pain per HPI.  Intermittent pain in hip, toe, not currently.  Moods are good.  PHYSICAL EXAM:  BP 128/82 mmHg  Pulse 68  Ht 5' 8.25" (1.734 m)  Wt 191 lb (86.637 kg)  BMI 28.81 kg/m2 Well developed, pleasant male in no distress  Right thumb: Tender at the right 1st MCP.  No significant swelling, warmth. He is no longer tender over the thenar eminence.  No pain with flexion or extension against resistance, normal strength.  Brisk capillary refill.  Lab Results  Component Value Date   HGBA1C 7.2* 07/15/2015   ASSESSMENT/PLAN:  Thumb pain, right - suspect arthritis/degenerative changes. with locking, may have soft tissue issues as well. x-ray and refer to hand surgeon for eval - Plan: DG Finger  Thumb Right, Ambulatory referral to Hand Surgery  Screening for prostate cancer - Plan: PSA, Medicare  Chest pain--resolved.  F/u as scheduled with cardiology

## 2015-07-28 NOTE — Patient Instructions (Signed)
Go to [facility] today for x-ray. We are referring you to a hand specialist.  Continue to try and rest the thumb to prevent inflammation and pain (ie no guitar playing if this increases your pain). You may continue to use ice/heat (whichever works best) and topical medications, if helpful (ie Biofreeze).

## 2015-07-29 LAB — PSA, MEDICARE: PSA: 0.81 ng/mL (ref <=4.00)

## 2015-08-18 NOTE — Progress Notes (Signed)
Cardiology Office Note    Date:  08/19/2015   ID:  Gregory Friedman, DOB [birth year], MRN [id number]  PCP:  Vikki Ports, MD  Cardiologist:  Dr. Harrington Challenger   CC: post hospital follow up.   History of Present Illness:  Gregory Friedman is a 65 y.o. male with a history of CAD s/p PCI and CABG (2005 in [place]), hypertension, type 2 diabetes, dyslipidemia, and anxiety who presents to clinic for post hospital follow up.   He underwent cardiac cath in 2012 which showed LIMA to LAD atretic, SVG to OM occluded. There were multiple areas which could be causing chest pain or ischemia ( severe disease in distal LCx or mod disease in diag). Dr. Julianne Handler recommended medical management at that time with follow up nuc to try to define areas of ischemia. I don't see that this was ever done. He has not been seen by his primary cardiologist, Dr. Harrington Challenger since 2013.   He presented to the [facility] ED on 07/15/15 with an episode of chest pressure followed by palpitations. He ruled out for MI and underwent nuclear stress test which showed old infarct pattern with no ischemia. Telemetry was unremarkable. He was discharged that same day.   Today he presents to clinic for follow up. He has had no more chest pain or palpitations. No LE edema, orthopnea or PND. He does get some right arm numbness and he is seeing a hand specialist. No dizziness or syncope. Otherwise feeling quite well.   Past Medical History:  Diagnosis Date  . Anxiety   . Arthritis   . BPH (benign prostatic hyperplasia)   . BPV (benign positional vertigo) 2012   improved after vestibular rehab  . 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 Depression   . Diverticulosis   . DM (diabetes mellitus) ([place])    type II.   [name] GERD (gastroesophageal reflux disease)   . Gout attack 2015   right great toe  . Hyperlipidemia   . Hypertension    sees Dr  Tera Helper. [place]  . Microscopic hematuria    referred to urology  2012  . Myocardial infarction [facility]) 2003   with both PCI, and CABG (Dr. Dorris Carnes)  . S/P radiation therapy    Received 8 fractions, then stopped due to mucositis with Radiation/ Cisplatin  . Serrated adenoma of colon    2013    Past Surgical History:  Procedure Laterality Date  . CARDIAC CATHETERIZATION  05/2010  . CARDIOVASCULAR STRESS TEST  07/2009  . COLONOSCOPY  01/2011   polyp; next due 2018.   [name] CORONARY ANGIOPLASTY  2003; 2006   2 stents in heart  . CORONARY ARTERY BYPASS GRAFT  01/2003   2 vessels  . CYSTOSCOPY  02/09/2011   Procedure: CYSTOSCOPY;  Surgeon: Marissa Nestle, MD;  [facility]: [facility];  Service: Urology;  Laterality: N/A;  . FRACTURE SURGERY Right    RT TALAR REPAIR  . MASS EXCISION N/A 03/13/2012   Procedure: EXCISION LEFT BUCCAL MUCOSA;  Surgeon: Rozetta Nunnery, MD;  Location: [place];  Service: ENT;  Laterality: N/A;  . RADICAL NECK DISSECTION Left 03/13/2012   Procedure: RADICAL NECK DISSECTION;  Surgeon: Rozetta Nunnery, MD;  Location: [place];  Service: ENT;  Laterality: Left;  . TOOTH EXTRACTION N/A 03/13/2012   Procedure: EXTRACTION MOLARS;  Surgeon: Rozetta Nunnery, MD;  Location: [place];  Service: ENT;  Laterality: N/A;  Current Medications: Outpatient Medications Prior to Visit  Medication Sig Dispense Refill  . aspirin 81 MG tablet Take 81 mg by mouth daily.    [name] glucose blood test strip Use as instructed 100 each prn  . Lancets MISC Please test blood sugars daily 100 each prn  . metFORMIN (GLUCOPHAGE-XR) 500 MG 24 hr tablet Take 2 tablets (1,000 mg total) by mouth daily with breakfast. 180 tablet 1  . metoprolol succinate (TOPROL-XL) 25 MG 24 hr tablet Take 1 tablet by mouth  daily 90 tablet 0  . Multiple Vitamins-Minerals (MULTIVITAMIN WITH MINERALS) tablet Take 1 tablet by mouth 2 (two) times daily.     [name] omeprazole (PRILOSEC) 40 MG capsule Take 1 capsule by mouth  daily 90 capsule 0  . Saw Palmetto, Serenoa repens, 320 MG CAPS  Take 1 capsule by mouth 2 (two) times daily.     . simvastatin (ZOCOR) 40 MG tablet Take 1 tablet by mouth at  bedtime 90 tablet 0  . nitroGLYCERIN (NITROSTAT) 0.4 MG SL tablet Place 1 tablet (0.4 mg total) under the tongue every 5 (five) minutes as needed. Chest pain 25 tablet 1  . CINNAMON PO Take 600 mg by mouth daily.     . enalapril (VASOTEC) 10 MG tablet Take 1 tablet (10 mg total) by mouth 2 (two) times daily. (Patient not taking: Reported on 08/19/2015) 180 tablet 1  . fluticasone (FLONASE) 50 MCG/ACT nasal spray Place 2 sprays into both nostrils daily. (Patient not taking: Reported on 08/19/2015) 48 g 0  . naproxen (NAPROSYN) 500 MG tablet Take 1 tablet (500 mg total) by mouth 2 (two) times daily with a meal. (Patient not taking: Reported on 07/28/2015) 30 tablet 0  . Saxagliptin-Metformin 2.05-998 MG TB24 Take 1 tablet by mouth daily.     No facility-administered medications prior to visit.      Allergies:   Review of patient's allergies indicates no known allergies.   Social History   Social History  . Marital status: Married    Spouse name: N/A  . Number of children: 3  . Years of education: N/A   Occupational History  . disabled Retired    was in [place]   Social History Main Topics  . Smoking status: Never Smoker  . Smokeless tobacco: Never Used  . Alcohol use No  . Drug use: No  . Sexual activity: Not Asked   Other Topics Concern  . None   Social History Narrative   Married;  2 children in [place], son in [place]; 6 grandchildren           Family History:  The patient's family history includes Asthma in his father; Cancer in his daughter; Diabetes in his brother, brother, and sister; Heart disease in his brother; Parkinson's disease in his brother; Seizures in his mother; Stroke in his sister.     ROS:   Please see the history of present illness.    ROS All other systems reviewed and are negative.   PHYSICAL EXAM:   VS:  BP (!) 142/80   Pulse 62   Ht 5\' 8"   (1.727 m)   Wt 190 lb 1.9 oz (86.2 kg)   BMI 28.91 kg/m    GEN: Well nourished, well developed, in no acute distress  HEENT: normal  Neck: no JVD, carotid bruits, or masses Cardiac: RRR; no murmurs, rubs, or gallops,no edema  Respiratory:  clear to auscultation bilaterally, normal work of breathing GI: soft, nontender, nondistended, + BS MS: no  deformity or atrophy  Skin: warm and dry, no rash Neuro:  Alert and Oriented x 3, Strength and sensation are intact Psych: euthymic mood, full affect  Wt Readings from Last 3 Encounters:  08/19/15 190 lb 1.9 oz (86.2 kg)  07/28/15 191 lb (86.6 kg)  07/15/15 191 lb 8 oz (86.9 kg)      Studies/Labs Reviewed:   EKG:  EKG is NOT ordered today.   Recent Labs: 04/19/2015: ALT 42; TSH 3.19 07/15/2015: BUN 16; Creatinine, Ser 1.39; Hemoglobin 12.8; Platelets 172; Potassium 3.9; Sodium 140   Lipid Panel    Component Value Date/Time   CHOL 109 (L) 04/19/2015 0001   TRIG 134 04/19/2015 0001   HDL 42 04/19/2015 0001   CHOLHDL 2.6 04/19/2015 0001   VLDL 27 04/19/2015 0001   LDLCALC 40 04/19/2015 0001    Additional studies/ records that were reviewed today include:  Nuclear stress test 07/15/15 IMPRESSION: 1. Apical infarct without definite peri-infarct ischemia. 2. Associated apical hypokinesis. 3. Left ventricular ejection fraction 47% 4. Non invasive risk stratification*: Intermediate    ASSESSMENT & PLAN:   Chest pain: now resolved.   CAD s/p CABG (2005 in [place]): he underwent cardiac cath in 2012 which showed LIMA to LAD atretic, SVG to OM occluded. There were multiple areas which could be causing chest pain or ischemia ( severe disease in distal LCx or mod disease in diag). Dr. Julianne Handler recommended medical management at that time with follow up nuc to try to define areas of ischemia. I don't see that this was ever done. Recently admitted for atypical chest pain and nuc showed no ischemia with old infarct pattern. Continue  ASA and statin and BB   Palpitations: NSR by tele while recently admitted. These have resolved. Continue to monitor.   HTN: BP well controlled on current regimen   Medication Adjustments/Labs and Tests Ordered: Current medicines are reviewed at length with the patient today.  Concerns regarding medicines are outlined above.  Medication changes, Labs and Tests ordered today are listed in the Patient Instructions below. Patient Instructions  Medication Instructions:  Your physician recommends that you continue on your current medications as directed. Please refer to the Current Medication list given to you today.   Labwork: NONE ORDERED  Testing/Procedures: NONE ORDERED  Follow-Up: Your physician wants you to follow-up in: [place] DR. ROSS   You will receive a reminder letter in the mail two months in advance. If you don't receive a letter, please call our office to schedule the follow-up appointment.   Any Other Special Instructions Will Be Listed Below (If Applicable).     If you need a refill on your cardiac medications before your next appointment, please call your pharmacy.      Signed, Angelena Form, PA-C  08/19/2015 10:56 AM    [facility] [place], [place], [place]  [address] Phone: [phone]; Fax: [phone]

## 2015-08-19 ENCOUNTER — Encounter: Payer: Self-pay | Admitting: Physician Assistant

## 2015-08-19 ENCOUNTER — Ambulatory Visit (INDEPENDENT_AMBULATORY_CARE_PROVIDER_SITE_OTHER): Payer: Medicare Other | Admitting: Physician Assistant

## 2015-08-19 VITALS — BP 142/80 | HR 62 | Ht 68.0 in | Wt 190.1 lb

## 2015-08-19 DIAGNOSIS — I10 Essential (primary) hypertension: Secondary | ICD-10-CM | POA: Diagnosis not present

## 2015-08-19 DIAGNOSIS — R002 Palpitations: Secondary | ICD-10-CM

## 2015-08-19 DIAGNOSIS — I2510 Atherosclerotic heart disease of native coronary artery without angina pectoris: Secondary | ICD-10-CM | POA: Diagnosis not present

## 2015-08-19 MED ORDER — NITROGLYCERIN 0.4 MG SL SUBL: 0.4000 mg | SUBLINGUAL_TABLET | SUBLINGUAL | 1 refills | Status: DC | PRN

## 2015-08-19 NOTE — Patient Instructions (Signed)
Medication Instructions:  Your physician recommends that you continue on your current medications as directed. Please refer to the Current Medication list given to you today.   Labwork: NONE ORDERED  Testing/Procedures: NONE ORDERED  Follow-Up: Your physician wants you to follow-up in: 6 MONTHS WITH DR. ROSS You will receive a reminder letter in the mail two months in advance. If you don't receive a letter, please call our office to schedule the follow-up appointment.   Any Other Special Instructions Will Be Listed Below (If Applicable).     If you need a refill on your cardiac medications before your next appointment, please call your pharmacy.   

## 2015-08-20 ENCOUNTER — Other Ambulatory Visit: Payer: Self-pay | Admitting: *Deleted

## 2015-08-20 MED ORDER — NITROGLYCERIN 0.4 MG SL SUBL: 0.4000 mg | SUBLINGUAL_TABLET | SUBLINGUAL | 1 refills | Status: DC | PRN

## 2015-08-23 DIAGNOSIS — M65311 Trigger thumb, right thumb: Secondary | ICD-10-CM | POA: Diagnosis not present

## 2015-08-31 ENCOUNTER — Other Ambulatory Visit: Payer: Self-pay | Admitting: Family Medicine

## 2015-08-31 DIAGNOSIS — I10 Essential (primary) hypertension: Secondary | ICD-10-CM

## 2015-09-06 ENCOUNTER — Other Ambulatory Visit: Payer: Self-pay | Admitting: *Deleted

## 2015-09-06 DIAGNOSIS — E119 Type 2 diabetes mellitus without complications: Secondary | ICD-10-CM

## 2015-09-06 MED ORDER — ENALAPRIL MALEATE 10 MG PO TABS: 10.0000 mg | ORAL_TABLET | Freq: Every day | ORAL | 1 refills | Status: DC

## 2015-09-06 MED ORDER — SIMVASTATIN 40 MG PO TABS: 40.0000 mg | ORAL_TABLET | Freq: Every day | ORAL | 1 refills | Status: DC

## 2015-09-06 MED ORDER — METOPROLOL SUCCINATE ER 25 MG PO TB24: 25.0000 mg | ORAL_TABLET | Freq: Every day | ORAL | 1 refills | Status: DC

## 2015-09-06 MED ORDER — METFORMIN HCL ER 500 MG PO TB24: 1000.0000 mg | ORAL_TABLET | Freq: Every day | ORAL | 1 refills | Status: DC

## 2015-09-06 MED ORDER — OMEPRAZOLE 40 MG PO CPDR: 40.0000 mg | DELAYED_RELEASE_CAPSULE | Freq: Every day | ORAL | 1 refills | Status: DC

## 2015-09-14 DIAGNOSIS — H40013 Open angle with borderline findings, low risk, bilateral: Secondary | ICD-10-CM | POA: Diagnosis not present

## 2015-09-14 DIAGNOSIS — H2513 Age-related nuclear cataract, bilateral: Secondary | ICD-10-CM | POA: Diagnosis not present

## 2015-09-14 DIAGNOSIS — H43813 Vitreous degeneration, bilateral: Secondary | ICD-10-CM | POA: Diagnosis not present

## 2015-09-14 DIAGNOSIS — E119 Type 2 diabetes mellitus without complications: Secondary | ICD-10-CM | POA: Diagnosis not present

## 2015-09-14 LAB — HM DIABETES EYE EXAM

## 2015-09-16 ENCOUNTER — Encounter: Payer: Self-pay | Admitting: Family Medicine

## 2015-09-17 DIAGNOSIS — J029 Acute pharyngitis, unspecified: Secondary | ICD-10-CM | POA: Diagnosis not present

## 2015-10-27 DIAGNOSIS — M65311 Trigger thumb, right thumb: Secondary | ICD-10-CM | POA: Diagnosis not present

## 2015-11-25 ENCOUNTER — Encounter: Payer: Self-pay | Admitting: *Deleted

## 2015-12-03 ENCOUNTER — Other Ambulatory Visit: Payer: Self-pay | Admitting: Family Medicine

## 2015-12-03 DIAGNOSIS — E119 Type 2 diabetes mellitus without complications: Secondary | ICD-10-CM

## 2015-12-08 ENCOUNTER — Telehealth: Payer: Self-pay | Admitting: Internal Medicine

## 2015-12-08 NOTE — Telephone Encounter (Signed)
Pt called stating having chest pressure and shortness of breath.    It woke him out of his sleep.  Per pt heart beat is erratic right now and would like to be seen

## 2015-12-08 NOTE — Telephone Encounter (Signed)
Spoke with pt and he states that he woke up in the middle of the night with CP, sweating, SOB and dizziness.   Pt states that he could feel his heart racing.  Checked vitals at [place] and they were 175/89, 53.  Pt did not take Nitro when he first woke up.  Pt still currently having CP so had pt take Nitro.  CP improved and pt did develop a HA.  Has not take AM meds.  Advised pt that he should go to ER for evaluation.  Pt verbalized understanding and was in agreement.  Pt will have wife bring him to ER.  Informed Trish, hospital card master, that pt is coming.

## 2015-12-10 ENCOUNTER — Ambulatory Visit (INDEPENDENT_AMBULATORY_CARE_PROVIDER_SITE_OTHER): Payer: Medicare Other | Admitting: Physician Assistant

## 2015-12-10 ENCOUNTER — Encounter: Payer: Self-pay | Admitting: Physician Assistant

## 2015-12-10 VITALS — BP 148/94 | HR 60 | Ht 68.0 in | Wt 188.0 lb

## 2015-12-10 DIAGNOSIS — I10 Essential (primary) hypertension: Secondary | ICD-10-CM | POA: Diagnosis not present

## 2015-12-10 DIAGNOSIS — R0602 Shortness of breath: Secondary | ICD-10-CM | POA: Diagnosis not present

## 2015-12-10 DIAGNOSIS — R002 Palpitations: Secondary | ICD-10-CM

## 2015-12-10 DIAGNOSIS — I2510 Atherosclerotic heart disease of native coronary artery without angina pectoris: Secondary | ICD-10-CM

## 2015-12-10 MED ORDER — METOPROLOL SUCCINATE ER 50 MG PO TB24: 50.0000 mg | ORAL_TABLET | Freq: Every day | ORAL | 6 refills | Status: DC

## 2015-12-10 NOTE — Progress Notes (Signed)
Cardiology Office Note Date:  12/10/2015  Patient ID:  Gregory Friedman, Gregory Friedman September 09, 1950, MRN [id number] PCP:  Vikki Ports, MD  Cardiologist:  Dr. Harrington Challenger (last seen 2013)    Chief Complaint: CP  History of Present Illness: Gregory Friedman is a 65 y.o. male with history of CAD s/p PCI and CABG (2005 in [place]), hypertension, type 2 diabetes, dyslipidemia, and anxiety.    He underwent cardiac cath in 2012 which showed LIMA to LAD atretic, SVG to OM occluded. There were multiple areas which could be causing chest pain or ischemia ( severe disease in distal LCx or mod disease in diag). Dr. Julianne Handler recommended medical management at that time with follow up nuc to try to define areas of ischemia. I don't see that this was ever done. He has not been seen by his primary cardiologist, Dr. Harrington Challenger since 2013.   He presented to the [facility] ED on 07/15/15 with an episode of chest pressure followed by palpitations. He ruled out for MI and underwent nuclear stress test which showed old infarct pattern with no ischemia. Telemetry was unremarkable. He was discharged that same day. He was subsequently seen by cardiology service, K. Grandville Silos, PA-C in July without ongoing complaints.  He comes in today to be seen for Dr. Harrington Challenger.  He was woken Wed morning with a feeling of gasping for air and his heart racing out of his chest.  He states was about 8AM and normally sleeps bit later (keeping late hours at night).  He got out of bed checked his BP (feeling improved by this time a couple minutes later) and it was 178/80's, he called the office and was recommended to take a s/l NTG and go to the ER.  He denies having any CP, pressure or heaviness at all.  Just the racing and SOB.  By the time he was talking to the office he was feeling nearly back to normal, went ahead and took the NTG but since he was feeling better elected not to go to the ER.  He did feel sluggish the rest of the day, but back to normal by the next  morning and has not had any further symptoms at all.  This was nothing like the CP her went to the hospital for in June.  And again, he state did not have any CP/pressure type feeling. He did not have the sensation of near syncope or syncope, may have felt a little lightheaded upon standing up getting out of bed.  Additional studies/ records that were reviewed today include:  Nuclear stress test 07/15/15 IMPRESSION: 1. Apical infarct without definite peri-infarct ischemia. 2. Associated apical hypokinesis. 3. Left ventricular ejection fraction 47% 4. Non invasive risk stratification*: Intermediate  08/12/09: TTE Study Conclusions - Left ventricle: The cavity size was normal. Wall thickness was  increased in a pattern of mild LVH. Systolic function was mildly  reduced. The estimated ejection fraction was in the range of 45%  to 50%. Mid anteroseptal, apical septal, and true apex severe  hypokinesis. Doppler parameters are consistent with abnormal left  ventricular relaxation (grade 1 diastolic dysfunction). - Aortic valve: There was no stenosis. - Mitral valve: Trivial regurgitation. - Left atrium: The atrium was mildly dilated. - Pulmonary veins: Normal pulmonary vein doppler pattern. - Right ventricle: The cavity size was normal. Systolic function was  normal. - Pulmonary arteries: PA peak pressure: 28mm Hg (S). - Inferior vena cava: The vessel was normal in size; the  respirophasic diameter changes were  in the normal range (= 50%);  findings are consistent with normal central venous pressure. Impressions - Normal LV size with mild LV hypertrophy. EF 45-50% with severe  hypokinesis of the mid anteroseptum, apical septum, and true apex.  Normal RV size and systolic function. Normal PA pressure.  05/26/10: [facility] IMPRESSION: 1. Triple-vessel coronary artery disease. 2. Patent stents in the mid left anterior descending artery and in the     proximal portion  of the second obtuse marginal branch of the     circumflex artery. 3. Moderate nonobstructive disease in the diagonal branch, the     circumflex artery and its distal portion, and the right coronary     artery in the midportion. 4. Normal left ventricular systolic function. RECOMMENDATIONS:  This patient has multiple areas, which could be causing chest pain and could be causing ischemia.  This could be related to the severe disease in the distal portion of the circumflex artery that involved very small-caliber branches.  This also could be secondary to the moderate stenosis in the diagonal branch.  At this time, I would recommend medical management.  If we are unable to control his chest pain with medical management, then I would recommend a nuclear stress test to try to define any areas of ischemia that could be addressed possibly with percutaneous intervention.  NOTE: that in the body of the report  Describes  5. The left internal mammary artery graft to the LAD is atretic with     very little flow. 6. The saphenous vein graft to the second obtuse marginal is known to     be occluded and was not injected.    Past Medical History:  Diagnosis Date  . Anxiety   . Arthritis   . BPH (benign prostatic hyperplasia)   . BPV (benign positional vertigo) 2012   improved after vestibular rehab  . 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 Depression   . Diverticulosis   . DM (diabetes mellitus) ([place])    type II.   [name] GERD (gastroesophageal reflux disease)   . Gout attack 2015   right great toe  . Hyperlipidemia   . Hypertension    sees Dr  Tera Helper. [place]  . Microscopic hematuria    referred to urology 2012  . Myocardial infarction 2003   with both PCI, and CABG (Dr. Dorris Carnes)  . S/P radiation therapy    Received 8 fractions, then stopped due to mucositis with Radiation/ Cisplatin  . Serrated adenoma of colon    2013    Past Surgical  History:  Procedure Laterality Date  . CARDIAC CATHETERIZATION  05/2010  . CARDIOVASCULAR STRESS TEST  07/2009  . COLONOSCOPY  01/2011   polyp; next due 2018.   [name] CORONARY ANGIOPLASTY  2003; 2006   2 stents in heart  . CORONARY ARTERY BYPASS GRAFT  01/2003   2 vessels  . CYSTOSCOPY  02/09/2011   Procedure: CYSTOSCOPY;  Surgeon: Marissa Nestle, MD;  [facility];  Service: Urology;  Laterality: N/A;  . FRACTURE SURGERY Right    RT TALAR REPAIR  . MASS EXCISION N/A 03/13/2012   Procedure: EXCISION LEFT BUCCAL MUCOSA;  Surgeon: Rozetta Nunnery, MD;  Location: [place];  Service: ENT;  Laterality: N/A;  . RADICAL NECK DISSECTION Left 03/13/2012   Procedure: RADICAL NECK DISSECTION;  Surgeon: Rozetta Nunnery, MD;  Location: [place];  Service: ENT;  Laterality: Left;  .  TOOTH EXTRACTION N/A 03/13/2012   Procedure: EXTRACTION MOLARS;  Surgeon: Rozetta Nunnery, MD;  Location: [place];  Service: ENT;  Laterality: N/A;    Current Outpatient Prescriptions  Medication Sig Dispense Refill  . aspirin 81 MG tablet Take 81 mg by mouth daily.    . enalapril (VASOTEC) 10 MG tablet Take 1 tablet (10 mg total) by mouth daily. 90 tablet 1  . fluticasone (FLONASE) 50 MCG/ACT nasal spray USE 2 SPRAYS IN EACH  NOSTRIL DAILY 48 g 0  . glucose blood test strip Use as instructed 100 each prn  . Lancets MISC Please test blood sugars daily 100 each prn  . metFORMIN (GLUCOPHAGE-XR) 500 MG 24 hr tablet TAKE 2 TABLETS BY MOUTH  DAILY WITH BREAKFAST 180 tablet 0  . metoprolol succinate (TOPROL-XL) 25 MG 24 hr tablet Take 1 tablet (25 mg total) by mouth daily. 90 tablet 1  . Multiple Vitamins-Minerals (MULTIVITAMIN WITH MINERALS) tablet Take 1 tablet by mouth 2 (two) times daily.     . nitroGLYCERIN (NITROSTAT) 0.4 MG SL tablet Place 1 tablet (0.4 mg total) under the tongue every 5 (five) minutes as needed. Chest pain 25 tablet 1  . omeprazole (PRILOSEC) 40 MG capsule TAKE 1 CAPSULE BY MOUTH  DAILY 90  capsule 0  . Saw Palmetto, Serenoa repens, 320 MG CAPS Take 1 capsule by mouth 2 (two) times daily.     . simvastatin (ZOCOR) 40 MG tablet TAKE 1 TABLET BY MOUTH AT  BEDTIME 90 tablet 0   No current facility-administered medications for this visit.     Allergies:   Patient has no known allergies.   Social History:  The patient  reports that he has never smoked. He has never used smokeless tobacco. He reports that he does not drink alcohol or use drugs.   Family History:  The patient's family history includes Asthma in his father; Cancer in his daughter; Diabetes in his brother, brother, and sister; Heart disease in his brother; Parkinson's disease in his brother; Seizures in his mother; Stroke in his sister.  ROS:  Please see the history of present illness.  All other systems are reviewed and otherwise negative.   PHYSICAL EXAM:  VS:  BP (!) 148/94   Pulse 60   Ht 5\' 8"  (1.727 m)   Wt 188 lb (85.3 kg)   BMI 28.59 kg/m  BMI: Body mass index is 28.59 kg/m. Well nourished, well developed, in no acute distress  HEENT: normocephalic, atraumatic  Neck: no JVD, carotid bruits or masses Cardiac:  RRR no significant murmurs, no rubs, or gallops Lungs:  clear to auscultation bilaterally, no wheezing, rhonchi or rales  Abd: soft, nontender MS: no deformity or atrophy Ext:  no edema  Skin: warm and dry, no rash Neuro:  No gross deficits appreciated Psych: euthymic mood, full affect    EKG:  Done today reviewed with Dr. Burt Knack (DOD) shows SR, no ischemic looking changes  Recent Labs: 04/19/2015: ALT 42; TSH 3.19 07/15/2015: BUN 16; Creatinine, Ser 1.39; Hemoglobin 12.8; Platelets 172; Potassium 3.9; Sodium 140  04/19/2015: Cholesterol 109; HDL 42; LDL Cholesterol 40; Total CHOL/HDL Ratio 2.6; Triglycerides 134; VLDL 27   CrCl cannot be calculated (Patient's most recent lab result is older than the maximum 21 days allowed.).   Wt Readings from Last 3 Encounters:  12/10/15 188 lb (85.3  kg)  08/19/15 190 lb 1.9 oz (86.2 kg)  07/28/15 191 lb (86.6 kg)     Other studies reviewed: Additional  studies/records reviewed today include: summarized above  ASSESSMENT AND PLAN:  1. Palpitations/SOB  2. CAD (severe) with hx of CABG ;ast cath 2012 as above     Stress myoview June 2017, no ischemia     On ASA, statin, BB     Patient mentions historically has been on Imdur, did not tolerate with stomach upset      3. HTN     Stable with room to up-titrate his BB  3. HLD     march 2017 looked pretty good     Will defer to Dr. Harrington Challenger, on Zocor 40mg   Case and EKG are reviewed with Dr. Burt Knack, no real c/o CP or anginal sounding symptoms,  but profound palpitations associated with SOB.  Will increase his Toprol to 50mg  daily and plan for an event monitor (notinghe apparently did have c/o palpitations historically, though nothing like Wed AM).  should he have recurrent, persistent or escalating symptoms he is instructed to see medical  Attention immediately with 911 if needed.  Will also update hie echocardiogram.  Disposition: F/u with Dr. Harrington Challenger or cardiology APP in 2 months, sooner if needed.  Current medicines are reviewed at length with the patient today.  The patient did not have any concerns regarding medicines.  Haywood Lasso, PA-C 12/10/2015 3:57 PM     [address] [place] [address] [phone] (office)  [phone] (fax)

## 2015-12-10 NOTE — Telephone Encounter (Signed)
Called back to speak with patient.  It doesn't look like he went tot he ER as instructed on 11/15.    I spoke with his wife who reports that he was awakened by chest discomfort/pressure and palpitations and couldn't breathe. His BP was elevated 175/89, HR 56.  BP came down to 142/73.  She said he does not snore.       Symptoms were relieved with NTG x 2.  But he remains weak. She said he did not tell her what was going on at the time, and she was not home.  Appointment made today to see APP at 2:30 pm.

## 2015-12-10 NOTE — Patient Instructions (Addendum)
Medication Instructions:   START TAKING METOPROLOL 50 MG  ONCE  A DAY   If you need a refill on your cardiac medications before your next appointment, please call your pharmacy.  Labwork: NONE ORDERED  TODAY    Testing/Procedures: Your physician has recommended that you wear an event monitor. Event monitors are medical devices that record the heart's electrical activity. Doctors most often [place] these monitors to diagnose arrhythmias. Arrhythmias are problems with the speed or rhythm of the heartbeat. The monitor is a small, portable device. You can wear one while you do your normal daily activities. This is usually used to diagnose what is causing palpitations/syncope (passing out).  Your physician has requested that you have an echocardiogram. Echocardiography is a painless test that uses sound waves to create images of your heart. It provides your doctor with information about the size and shape of your heart and how well your heart's chambers and valves are working. This procedure takes approximately one hour. There are no restrictions for this procedure.     Follow-Up:  WITH DR ROSS OR AN APP IN 2 MONTHS   Any Other Special Instructions Will Be Listed Below (If Applicable).

## 2015-12-14 ENCOUNTER — Telehealth: Payer: Self-pay

## 2015-12-14 NOTE — Telephone Encounter (Signed)
His cardiologist prescribes this. (I got a fax recently from [facility]--without a phone encounter--and documented this info on that paper) Last filled 07/2015 #25 with a refill, from the cardiologist. He likely should already have a refill left from the cardiologist

## 2015-12-14 NOTE — Telephone Encounter (Signed)
Pt notified. /RLB  

## 2015-12-14 NOTE — Telephone Encounter (Signed)
Request for nitro to [facility]. Victorino December

## 2015-12-22 ENCOUNTER — Ambulatory Visit (INDEPENDENT_AMBULATORY_CARE_PROVIDER_SITE_OTHER): Payer: Medicare Other

## 2015-12-22 DIAGNOSIS — R002 Palpitations: Secondary | ICD-10-CM

## 2016-01-04 ENCOUNTER — Other Ambulatory Visit: Payer: Self-pay

## 2016-01-04 ENCOUNTER — Ambulatory Visit (HOSPITAL_COMMUNITY): Payer: Medicare Other | Attending: Cardiology

## 2016-01-04 DIAGNOSIS — E119 Type 2 diabetes mellitus without complications: Secondary | ICD-10-CM | POA: Insufficient documentation

## 2016-01-04 DIAGNOSIS — I119 Hypertensive heart disease without heart failure: Secondary | ICD-10-CM | POA: Insufficient documentation

## 2016-01-04 DIAGNOSIS — I340 Nonrheumatic mitral (valve) insufficiency: Secondary | ICD-10-CM | POA: Diagnosis not present

## 2016-01-04 DIAGNOSIS — R002 Palpitations: Secondary | ICD-10-CM | POA: Insufficient documentation

## 2016-01-04 DIAGNOSIS — E785 Hyperlipidemia, unspecified: Secondary | ICD-10-CM | POA: Insufficient documentation

## 2016-01-04 DIAGNOSIS — R29898 Other symptoms and signs involving the musculoskeletal system: Secondary | ICD-10-CM | POA: Diagnosis not present

## 2016-01-18 ENCOUNTER — Other Ambulatory Visit: Payer: Self-pay | Admitting: Nurse Practitioner

## 2016-01-26 DIAGNOSIS — M65311 Trigger thumb, right thumb: Secondary | ICD-10-CM | POA: Diagnosis not present

## 2016-01-27 ENCOUNTER — Other Ambulatory Visit: Payer: Self-pay | Admitting: Orthopedic Surgery

## 2016-01-31 ENCOUNTER — Telehealth: Payer: Self-pay

## 2016-01-31 MED ORDER — DILTIAZEM HCL ER COATED BEADS 120 MG PO CP24: 120.0000 mg | ORAL_CAPSULE | Freq: Every day | ORAL | 1 refills | Status: DC

## 2016-01-31 NOTE — Addendum Note (Signed)
Addended by: Barbarann Ehlers A on: 01/31/2016 04:04 PM   Modules accepted: Orders

## 2016-01-31 NOTE — Telephone Encounter (Signed)
Pt agrees to take Diltiazem 120 mg  and has fu in [place] office with Dr Harrington Challenger 02/11/16 at 3:15 pm,escribed to local [facility]

## 2016-01-31 NOTE — Telephone Encounter (Signed)
-----   Message from Fay Records, MD sent at 01/28/2016  3:48 PM EST ----- Holter monitor shows PVCs  Isolated.  Nothing sustained.   I would recomm keeping on metoprolol   I would also add low dose Diltiazem 120 mg   Echo with normal pumping function Pt to call back in a couple wks with response

## 2016-01-31 NOTE — Telephone Encounter (Signed)
LMTCB-cc 

## 2016-02-11 ENCOUNTER — Ambulatory Visit: Payer: Self-pay | Admitting: Internal Medicine

## 2016-02-17 ENCOUNTER — Encounter (HOSPITAL_BASED_OUTPATIENT_CLINIC_OR_DEPARTMENT_OTHER): Payer: Self-pay | Admitting: *Deleted

## 2016-02-18 ENCOUNTER — Encounter (HOSPITAL_BASED_OUTPATIENT_CLINIC_OR_DEPARTMENT_OTHER): Payer: Self-pay | Admitting: *Deleted

## 2016-02-18 NOTE — Pre-Procedure Instructions (Signed)
Discussed cardiac hx. with Dr. Nyoka Cowden; pt. OK to come for surgery.

## 2016-02-21 ENCOUNTER — Encounter (HOSPITAL_BASED_OUTPATIENT_CLINIC_OR_DEPARTMENT_OTHER)
Admission: RE | Admit: 2016-02-21 | Discharge: 2016-02-21 | Disposition: A | Discharge status: Home / Self Care | Payer: Medicare HMO | Source: Ambulatory Visit | Attending: Orthopedic Surgery | Admitting: Orthopedic Surgery

## 2016-02-21 DIAGNOSIS — M65311 Trigger thumb, right thumb: Secondary | ICD-10-CM | POA: Insufficient documentation

## 2016-02-21 LAB — BASIC METABOLIC PANEL
ANION GAP: 10 (ref 5–15)
BUN: 12 mg/dL (ref 6–20)
CHLORIDE: 101 mmol/L (ref 101–111)
CO2: 27 mmol/L (ref 22–32)
CREATININE: 1.38 mg/dL — AB (ref 0.61–1.24)
Calcium: 10 mg/dL (ref 8.9–10.3)
GFR calc non Af Amer: 52 mL/min — ABNORMAL LOW (ref >60)
GFR, EST AFRICAN AMERICAN: 60 mL/min — AB (ref >60)
Glucose, Bld: 231 mg/dL — ABNORMAL HIGH (ref 65–99)
Potassium: 4.2 mmol/L (ref 3.5–5.1)
Sodium: 138 mmol/L (ref 135–145)

## 2016-02-22 NOTE — Progress Notes (Signed)
Dr. Orene Desanctis reviewed labs and most recent EKG's.  Will proceed with surgery as scheduled.

## 2016-02-24 ENCOUNTER — Encounter (HOSPITAL_BASED_OUTPATIENT_CLINIC_OR_DEPARTMENT_OTHER)
Admission: RE | Disposition: A | Discharge status: Home / Self Care | Payer: Self-pay | Source: Ambulatory Visit | Attending: Orthopedic Surgery

## 2016-02-24 ENCOUNTER — Encounter (HOSPITAL_BASED_OUTPATIENT_CLINIC_OR_DEPARTMENT_OTHER): Payer: Self-pay | Admitting: Anesthesiology

## 2016-02-24 ENCOUNTER — Ambulatory Visit (HOSPITAL_BASED_OUTPATIENT_CLINIC_OR_DEPARTMENT_OTHER)
Admission: RE | Admit: 2016-02-24 | Discharge: 2016-02-24 | Disposition: A | Discharge status: Home / Self Care | Payer: Medicare HMO | Source: Ambulatory Visit | Attending: Orthopedic Surgery | Admitting: Orthopedic Surgery

## 2016-02-24 ENCOUNTER — Ambulatory Visit (HOSPITAL_BASED_OUTPATIENT_CLINIC_OR_DEPARTMENT_OTHER): Payer: Medicare HMO | Admitting: Anesthesiology

## 2016-02-24 DIAGNOSIS — Z923 Personal history of irradiation: Secondary | ICD-10-CM | POA: Insufficient documentation

## 2016-02-24 DIAGNOSIS — I252 Old myocardial infarction: Secondary | ICD-10-CM | POA: Insufficient documentation

## 2016-02-24 DIAGNOSIS — I10 Essential (primary) hypertension: Secondary | ICD-10-CM | POA: Diagnosis not present

## 2016-02-24 DIAGNOSIS — M65311 Trigger thumb, right thumb: Secondary | ICD-10-CM | POA: Insufficient documentation

## 2016-02-24 DIAGNOSIS — Z951 Presence of aortocoronary bypass graft: Secondary | ICD-10-CM | POA: Insufficient documentation

## 2016-02-24 DIAGNOSIS — Z7982 Long term (current) use of aspirin: Secondary | ICD-10-CM | POA: Diagnosis not present

## 2016-02-24 DIAGNOSIS — E119 Type 2 diabetes mellitus without complications: Secondary | ICD-10-CM | POA: Diagnosis not present

## 2016-02-24 DIAGNOSIS — Z79899 Other long term (current) drug therapy: Secondary | ICD-10-CM | POA: Diagnosis not present

## 2016-02-24 DIAGNOSIS — Z7984 Long term (current) use of oral hypoglycemic drugs: Secondary | ICD-10-CM | POA: Insufficient documentation

## 2016-02-24 DIAGNOSIS — E785 Hyperlipidemia, unspecified: Secondary | ICD-10-CM | POA: Insufficient documentation

## 2016-02-24 DIAGNOSIS — I2510 Atherosclerotic heart disease of native coronary artery without angina pectoris: Secondary | ICD-10-CM | POA: Diagnosis not present

## 2016-02-24 DIAGNOSIS — K219 Gastro-esophageal reflux disease without esophagitis: Secondary | ICD-10-CM | POA: Diagnosis not present

## 2016-02-24 HISTORY — DX: Other specified postprocedural states: Z98.890

## 2016-02-24 HISTORY — DX: Nausea with vomiting, unspecified: R11.2

## 2016-02-24 HISTORY — DX: Stiffness of unspecified joint, not elsewhere classified: M25.60

## 2016-02-24 HISTORY — DX: Personal history of malignant neoplasm of other organs and systems: Z85.89

## 2016-02-24 HISTORY — DX: Type 2 diabetes mellitus without complications: E11.9

## 2016-02-24 HISTORY — DX: Personal history of other diseases of the musculoskeletal system and connective tissue: Z87.39

## 2016-02-24 HISTORY — DX: Trigger thumb, right thumb: M65.311

## 2016-02-24 HISTORY — DX: Atherosclerotic heart disease of native coronary artery without angina pectoris: I25.10

## 2016-02-24 HISTORY — PX: TRIGGER FINGER RELEASE: SHX641

## 2016-02-24 LAB — GLUCOSE, CAPILLARY
Glucose-Capillary: 153 mg/dL — ABNORMAL HIGH (ref 65–99)
Glucose-Capillary: 162 mg/dL — ABNORMAL HIGH (ref 65–99)

## 2016-02-24 SURGERY — RELEASE, A1 PULLEY, FOR TRIGGER FINGER
Anesthesia: Monitor Anesthesia Care | Site: Thumb | Laterality: Right

## 2016-02-24 MED ORDER — DEXAMETHASONE SODIUM PHOSPHATE 10 MG/ML IJ SOLN
INTRAMUSCULAR | Status: AC
  Filled 2016-02-24: qty 1

## 2016-02-24 MED ORDER — FENTANYL CITRATE (PF) 100 MCG/2ML IJ SOLN: 50.0000 ug | INTRAMUSCULAR | Status: DC | PRN

## 2016-02-24 MED ORDER — PROPOFOL 10 MG/ML IV BOLUS
INTRAVENOUS | Status: AC
  Filled 2016-02-24: qty 20

## 2016-02-24 MED ORDER — PROPOFOL 10 MG/ML IV BOLUS
INTRAVENOUS | Status: AC
Start: 2016-02-24 — End: 2016-02-24
  Filled 2016-02-24: qty 20

## 2016-02-24 MED ORDER — FENTANYL CITRATE (PF) 100 MCG/2ML IJ SOLN
INTRAMUSCULAR | Status: DC | PRN
  Administered 2016-02-24 (×2): 25 ug via INTRAVENOUS

## 2016-02-24 MED ORDER — CEFAZOLIN SODIUM-DEXTROSE 2-4 GM/100ML-% IV SOLN
2.0000 g | INTRAVENOUS | Status: AC
  Administered 2016-02-24: 2 g via INTRAVENOUS

## 2016-02-24 MED ORDER — SCOPOLAMINE 1 MG/3DAYS TD PT72
1.0000 | MEDICATED_PATCH | Freq: Once | TRANSDERMAL | Status: DC | PRN
  Administered 2016-02-24: 1.5 mg via TRANSDERMAL

## 2016-02-24 MED ORDER — LACTATED RINGERS IV SOLN
INTRAVENOUS | Status: DC
  Administered 2016-02-24: 10:00:00 via INTRAVENOUS

## 2016-02-24 MED ORDER — MIDAZOLAM HCL 2 MG/2ML IJ SOLN: 1.0000 mg | INTRAMUSCULAR | Status: DC | PRN

## 2016-02-24 MED ORDER — CHLORHEXIDINE GLUCONATE 4 % EX LIQD: 60.0000 mL | Freq: Once | CUTANEOUS | Status: DC

## 2016-02-24 MED ORDER — BUPIVACAINE HCL (PF) 0.25 % IJ SOLN
INTRAMUSCULAR | Status: DC | PRN
  Administered 2016-02-24: 7 mL

## 2016-02-24 MED ORDER — FENTANYL CITRATE (PF) 100 MCG/2ML IJ SOLN
INTRAMUSCULAR | Status: AC
  Filled 2016-02-24: qty 2

## 2016-02-24 MED ORDER — BUPIVACAINE HCL (PF) 0.25 % IJ SOLN
INTRAMUSCULAR | Status: AC
  Filled 2016-02-24: qty 30

## 2016-02-24 MED ORDER — LIDOCAINE HCL (PF) 0.5 % IJ SOLN
INTRAMUSCULAR | Status: DC | PRN
  Administered 2016-02-24: 30 mL via INTRAVENOUS

## 2016-02-24 MED ORDER — MIDAZOLAM HCL 2 MG/2ML IJ SOLN
INTRAMUSCULAR | Status: AC
  Filled 2016-02-24: qty 2

## 2016-02-24 MED ORDER — CEFAZOLIN SODIUM-DEXTROSE 2-4 GM/100ML-% IV SOLN
INTRAVENOUS | Status: AC
  Filled 2016-02-24: qty 100

## 2016-02-24 MED ORDER — HYDROCODONE-ACETAMINOPHEN 5-325 MG PO TABS: 1.0000 | ORAL_TABLET | Freq: Four times a day (QID) | ORAL | 0 refills | Status: DC | PRN

## 2016-02-24 MED ORDER — SCOPOLAMINE 1 MG/3DAYS TD PT72
MEDICATED_PATCH | TRANSDERMAL | Status: AC
  Filled 2016-02-24: qty 1

## 2016-02-24 MED ORDER — LIDOCAINE 2% (20 MG/ML) 5 ML SYRINGE
INTRAMUSCULAR | Status: AC
  Filled 2016-02-24: qty 5

## 2016-02-24 MED ORDER — ONDANSETRON HCL 4 MG/2ML IJ SOLN
INTRAMUSCULAR | Status: AC
  Filled 2016-02-24: qty 2

## 2016-02-24 MED ORDER — MIDAZOLAM HCL 5 MG/5ML IJ SOLN
INTRAMUSCULAR | Status: DC | PRN
  Administered 2016-02-24: 2 mg via INTRAVENOUS

## 2016-02-24 MED ORDER — ONDANSETRON HCL 4 MG/2ML IJ SOLN
INTRAMUSCULAR | Status: DC | PRN
  Administered 2016-02-24: 4 mg via INTRAVENOUS

## 2016-02-24 MED ORDER — HYDROMORPHONE HCL 1 MG/ML IJ SOLN: 0.2500 mg | INTRAMUSCULAR | Status: DC | PRN

## 2016-02-24 SURGICAL SUPPLY — 32 items
BANDAGE COBAN STERILE 2 (GAUZE/BANDAGES/DRESSINGS) ×3 IMPLANT
BLADE SURG 15 STRL LF DISP TIS (BLADE) ×1 IMPLANT
BLADE SURG 15 STRL SS (BLADE) ×2
BNDG ESMARK 4X9 LF (GAUZE/BANDAGES/DRESSINGS) IMPLANT
CHLORAPREP W/TINT 26ML (MISCELLANEOUS) ×3 IMPLANT
CORDS BIPOLAR (ELECTRODE) IMPLANT
COVER BACK TABLE 60X90IN (DRAPES) ×3 IMPLANT
COVER MAYO STAND STRL (DRAPES) ×3 IMPLANT
CUFF TOURNIQUET SINGLE 18IN (TOURNIQUET CUFF) IMPLANT
DECANTER SPIKE VIAL GLASS SM (MISCELLANEOUS) IMPLANT
DRAPE EXTREMITY T 121X128X90 (DRAPE) ×3 IMPLANT
DRAPE SURG 17X23 STRL (DRAPES) ×3 IMPLANT
GAUZE SPONGE 4X4 12PLY STRL (GAUZE/BANDAGES/DRESSINGS) ×3 IMPLANT
GAUZE XEROFORM 1X8 LF (GAUZE/BANDAGES/DRESSINGS) ×3 IMPLANT
GLOVE BIOGEL PI IND STRL 7.0 (GLOVE) ×2 IMPLANT
GLOVE BIOGEL PI IND STRL 8.5 (GLOVE) ×1 IMPLANT
GLOVE BIOGEL PI INDICATOR 7.0 (GLOVE) ×4
GLOVE BIOGEL PI INDICATOR 8.5 (GLOVE) ×2
GLOVE ECLIPSE 6.5 STRL STRAW (GLOVE) ×3 IMPLANT
GLOVE SURG ORTHO 8.0 STRL STRW (GLOVE) ×3 IMPLANT
GOWN STRL REUS W/ TWL LRG LVL3 (GOWN DISPOSABLE) ×1 IMPLANT
GOWN STRL REUS W/TWL LRG LVL3 (GOWN DISPOSABLE) ×2
GOWN STRL REUS W/TWL XL LVL3 (GOWN DISPOSABLE) ×3 IMPLANT
NEEDLE PRECISIONGLIDE 27X1.5 (NEEDLE) ×3 IMPLANT
NS IRRIG 1000ML POUR BTL (IV SOLUTION) ×3 IMPLANT
PACK BASIN DAY SURGERY FS (CUSTOM PROCEDURE TRAY) ×3 IMPLANT
STOCKINETTE 4X48 STRL (DRAPES) ×3 IMPLANT
SUT ETHILON 4 0 PS 2 18 (SUTURE) ×3 IMPLANT
SYR BULB 3OZ (MISCELLANEOUS) ×3 IMPLANT
SYR CONTROL 10ML LL (SYRINGE) ×3 IMPLANT
TOWEL OR 17X24 6PK STRL BLUE (TOWEL DISPOSABLE) ×6 IMPLANT
UNDERPAD 30X30 (UNDERPADS AND DIAPERS) ×3 IMPLANT

## 2016-02-24 NOTE — Anesthesia Preprocedure Evaluation (Signed)
Anesthesia Evaluation  Patient identified by MRN, date of birth, ID band Patient awake    Reviewed: Allergy & Precautions, NPO status , Patient's Chart, lab work & pertinent test results  History of Anesthesia Complications (+) PONV  Airway Mallampati: II  TM Distance: >3 FB     Dental   Pulmonary neg pulmonary ROS,    breath sounds clear to auscultation       Cardiovascular hypertension, + CAD and + Past MI   Rhythm:Regular Rate:Normal     Neuro/Psych negative neurological ROS     GI/Hepatic Neg liver ROS, GERD  ,  Endo/Other  diabetes  Renal/GU negative Renal ROS     Musculoskeletal   Abdominal   Peds  Hematology   Anesthesia Other Findings   Reproductive/Obstetrics                             Anesthesia Physical Anesthesia Plan  ASA: III  Anesthesia Plan: MAC and Bier Block   Post-op Pain Management:  Regional for Post-op pain   Induction:   Airway Management Planned: Simple Face Mask  Additional Equipment:   Intra-op Plan:   Post-operative Plan:   Informed Consent: I have reviewed the patients History and Physical, chart, labs and discussed the procedure including the risks, benefits and alternatives for the proposed anesthesia with the patient or authorized representative who has indicated his/her understanding and acceptance.   Dental advisory given  Plan Discussed with: CRNA and Anesthesiologist  Anesthesia Plan Comments:         Anesthesia Quick Evaluation

## 2016-02-24 NOTE — Anesthesia Procedure Notes (Signed)
Procedure Name: MAC Date/Time: 02/24/2016 10:50 AM Performed by: Marrianne Mood Pre-anesthesia Checklist: Patient identified, Timeout performed, Suction available, Emergency Drugs available and Patient being monitored Patient Re-evaluated:Patient Re-evaluated prior to inductionOxygen Delivery Method: Simple face mask Preoxygenation: Pre-oxygenation with 100% oxygen

## 2016-02-24 NOTE — H&P (Signed)
Gregory Friedman is an 66 y.o. male.   Chief Complaint: catching right thumb HPI: Gregory Friedman is a 66 year old right-hand-dominant male referred by Dr. Claudie Leach . He is complaining of pain in his right thumb. This at the IP joint with a snapping. States that it is a sharp pain with a VAS score of 7/10 to get it when it snaps. He has been using a brace without relief. He occasionally has discomfort up to his wrist. He states it is worse at night. You seems to make this worse in the a.m. if gradually improves during the day. He has no history of injury to his neck. He was given Naprosyn which has not helped. Has a history of diabetes type 2. He has no history of thyroid problems arthritis but does have a history of gout. Family history is positive for diabetes and negative for thyroid problems arthritis and gout. He is not complaining of any numbness or tingling. He is to get has no complaints of his left side. Been going on for several months.This was injected for second time at that time. He states did well until approximately 6 weeks ago when it began triggering again. He states he is now unable to fully bend it. Continues to be awakened at night with a catching.          Past Medical History:  Diagnosis Date  . Anxiety   . BPH (benign prostatic hyperplasia)   . Coronary artery disease   . Depression   . GERD (gastroesophageal reflux disease)   . History of gout    toe  . 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 Hyperlipidemia   . Hypertension    states under control with med., has been on med. since 2003  . 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 Myocardial infarction   . Non-insulin dependent type 2 diabetes mellitus ([place])   . PONV (postoperative nausea and vomiting)   . S/P radiation therapy    Received 8 fractions, then stopped due to mucositis with Radiation/ Cisplatin  . Trigger thumb of right hand  01/2016    Past Surgical History:  Procedure Laterality Date  . CARDIAC CATHETERIZATION  05/25/2010  . CARDIOVASCULAR STRESS TEST  07/2009  . COLONOSCOPY WITH PROPOFOL  02/16/2011  . CORONARY ANGIOPLASTY  2003; 2006   2 stents in heart  . CORONARY ARTERY BYPASS GRAFT  2005  . CYSTOSCOPY  02/09/2011   Procedure: CYSTOSCOPY;  Surgeon: Marissa Nestle, MD;  Location: [facility];  Service: Urology;  Laterality: N/A;  . ESOPHAGOGASTRODUODENOSCOPY (EGD) WITH ESOPHAGEAL DILATION  02/16/2011   with Propofol  . FOOT ARTHRODESIS, SUBTALAR Right 10/13/2009  . MASS EXCISION N/A 03/13/2012   Procedure: EXCISION LEFT BUCCAL MUCOSA;  Surgeon: Rozetta Nunnery, MD;  Location: [facility];  Service: ENT;  Laterality: N/A;  . RADICAL NECK DISSECTION Left 03/13/2012   Procedure: RADICAL NECK DISSECTION;  Surgeon: Rozetta Nunnery, MD;  Location: [facility];  Service: ENT;  Laterality: Left;  . TOOTH EXTRACTION N/A 03/13/2012   Procedure: EXTRACTION MOLARS;  Surgeon: Rozetta Nunnery, MD;  Location: [facility];  Service: ENT;  Laterality: N/A;    Family History  Problem Relation Age of Onset  . Stroke Sister   . Diabetes Brother   . Seizures Mother   . Asthma Father   . Cancer Daughter     thyroid cancer  .  Diabetes Sister   . Parkinson's disease Brother   . Heart disease Brother   . Diabetes Brother    Social History:  reports that he has never smoked. He has never used smokeless tobacco. He reports that he does not drink alcohol or use drugs.  Allergies: No Known Allergies  No prescriptions prior to admission.    No results found for this or any previous visit (from the past 48 hour(s)).  No results found.   Pertinent items are noted in HPI.  Height 5\' 8"  (1.727 m), weight 83.9 kg (185 lb).  General appearance: alert, cooperative and appears stated age Head: Normocephalic, without obvious abnormality Neck: no JVD Resp: clear to auscultation bilaterally Cardio: regular rate and rhythm,  S1, S2 normal, no murmur, click, rub or gallop GI: soft, non-tender; bowel sounds normal; no masses,  no organomegaly Extremities: catching right thumb Pulses: 2+ and symmetric Skin: Skin color, texture, turgor normal. No rashes or lesions Neurologic: Grossly normal Incision/Wound: na  Assessment/Plan  Assessment:  1. Trigger finger of right thumb    Plan: I discussed possibly surgical release of the A1 pulley. Pre-peri-and postoperative course are discussed along with risks and complications. He is where there is no guarantee to the surgery the possibility of infection recurrence injury to arteries nerves tendons incomplete relief of symptoms and dystrophy. Proceed to have this done. He is scheduled for release A1 pulley right thumb as an outpatient under regional anesthesia.      Gregory Friedman R 02/24/2016, 6:50 AM

## 2016-02-24 NOTE — Discharge Instructions (Addendum)
[facility] Instructions Hand Surgery  Wound Care: Keep your hand elevated above the level of your heart.  Do not allow it to dangle by your side.  Keep the dressing dry and do not remove it unless your doctor advises you to do so.  He will usually change it at the time of your post-op visit.  Moving your fingers is advised to stimulate circulation but will depend on the site of your surgery.  If you have a splint applied, your doctor will advise you regarding movement.  Activity: Do not drive or operate machinery today.  Rest today and then you may return to your normal activity and work as indicated by your physician.  Diet:  Drink liquids today or eat a light diet.  You may resume a regular diet tomorrow.    General expectations: Pain for two to three days. Fingers may become slightly swollen.  Call your doctor if any of the following occur: Severe pain not relieved by pain medication. Elevated temperature. Dressing soaked with blood. Inability to move fingers. White or bluish color to fingers.  Information for Discharge Teaching: EXPAREL (bupivacaine liposome injectable suspension)   Your surgeon gave you EXPAREL(bupivacaine) in your surgical incision to help control your pain after surgery.   EXPAREL is a local anesthetic that provides pain relief by numbing the tissue around the surgical site.  EXPAREL is designed to release pain medication over time and can control pain for up to 72 hours.  Depending on how you respond to EXPAREL, you may require less pain medication during your recovery.  Possible side effects:  Temporary loss of sensation or ability to move in the area where bupivacaine was injected.  Nausea, vomiting, constipation  Rarely, numbness and tingling in your mouth or lips, lightheadedness, or anxiety may occur.  Call your doctor right away if you think you may be experiencing any of these sensations, or if you have other questions regarding possible side  effects.  Follow all other discharge instructions given to you by your surgeon or nurse. Eat a healthy diet and drink plenty of water or other fluids.  If you return to the hospital for any reason within 96 hours following the administration of EXPAREL, please inform your health care providers.   Call your surgeon if you experience:   1.  Fever over 101.0. 2.  Inability to urinate. 3.  Nausea and/or vomiting. 4.  Extreme swelling or bruising at the surgical site. 5.  Continued bleeding from the incision. 6.  Increased pain, redness or drainage from the incision. 7.  Problems related to your pain medication. 8.  Any problems and/or concerns   Post Anesthesia Home Care Instructions  Activity: Get plenty of rest for the remainder of the day. A responsible adult should stay with you for 24 hours following the procedure.  For the next 24 hours, DO NOT: -Drive a car -Paediatric nurse -Drink alcoholic beverages -Take any medication unless instructed by your physician -Make any legal decisions or sign important papers.  Meals: Start with liquid foods such as gelatin or soup. Progress to regular foods as tolerated. Avoid greasy, spicy, heavy foods. If nausea and/or vomiting occur, drink only clear liquids until the nausea and/or vomiting subsides. Call your physician if vomiting continues.  Special Instructions/Symptoms: Your throat may feel dry or sore from the anesthesia or the breathing tube placed in your throat during surgery. If this causes discomfort, gargle with warm salt water. The discomfort should disappear within 24 hours.  If you had a scopolamine patch placed behind your ear for the management of post- operative nausea and/or vomiting:  1. The medication in the patch is effective for 72 hours, after which it should be removed.  Wrap patch in a tissue and discard in the trash. Wash hands thoroughly with soap and water. 2. You may remove the patch earlier than 72 hours if  you experience unpleasant side effects which may include dry mouth, dizziness or visual disturbances. 3. Avoid touching the patch. Wash your hands with soap and water after contact with the patch.

## 2016-02-24 NOTE — Anesthesia Postprocedure Evaluation (Signed)
Anesthesia Post Note  Patient: Gregory Friedman  Procedure(s) Performed: Procedure(s) (LRB): RELEASE TRIGGER FINGER/A-1 PULLEY RIGHT THUMB (Right)  Patient location during evaluation: PACU Anesthesia Type: MAC Level of consciousness: awake Pain management: pain level controlled Vital Signs Assessment: post-procedure vital signs reviewed and stable Respiratory status: spontaneous breathing Cardiovascular status: stable Anesthetic complications: no       Last Vitals:  Vitals:   02/24/16 1145 02/24/16 1205  BP: 140/75 (!) 142/79  Pulse: (!) 55 (!) 54  Resp: 18 18  Temp:  36.5 C    Last Pain:  Vitals:   02/24/16 1205  TempSrc:   PainSc: 0-No pain                 Nycole Kawahara

## 2016-02-24 NOTE — Transfer of Care (Signed)
Immediate Anesthesia Transfer of Care Note  Patient: Gregory Friedman  Procedure(s) Performed: Procedure(s) with comments: RELEASE TRIGGER FINGER/A-1 PULLEY RIGHT THUMB (Right) - FAB  Patient Location: PACU  Anesthesia Type:MAC and Bier block  Level of Consciousness: awake and patient cooperative  Airway & Oxygen Therapy: Patient Spontanous Breathing and Patient connected to face mask oxygen  Post-op Assessment: Report given to RN and Post -op Vital signs reviewed and stable  Post vital signs: Reviewed and stable  Last Vitals:  Vitals:   02/24/16 1009 02/24/16 1116  BP: (!) 182/98   Pulse: 60 (!) 55  Resp: 18 16  Temp: 37 C     Last Pain:  Vitals:   02/24/16 1009  TempSrc: Oral  PainSc: 6          Complications: No apparent anesthesia complications

## 2016-02-24 NOTE — Brief Op Note (Signed)
02/24/2016  11:12 AM  PATIENT:  Shanon Brow [name]  66 y.o. male  PRE-OPERATIVE DIAGNOSIS:  TRIGGER RIGHT THUMB  POST-OPERATIVE DIAGNOSIS:  TRIGGER RIGHT THUMB  PROCEDURE:  Procedure(s) with comments: RELEASE TRIGGER FINGER/A-1 PULLEY RIGHT THUMB (Right) - FAB  SURGEON:  Surgeon(s) and Role:    * Daryll Brod, MD - Primary  PHYSICIAN ASSISTANT:   ASSISTANTS: none   ANESTHESIA:   local and regional  EBL:  No intake/output data recorded.  BLOOD ADMINISTERED:none  DRAINS: none   LOCAL MEDICATIONS USED:  BUPIVICAINE   SPECIMEN:  No Specimen  DISPOSITION OF SPECIMEN:  N/A  COUNTS:  YES  TOURNIQUET:   Total Tourniquet Time Documented: Forearm (Left) - 19 minutes Total: Forearm (Left) - 19 minutes   DICTATION: .Other Dictation: Dictation Number [id number]  PLAN OF CARE: Discharge to home after PACU  PATIENT DISPOSITION:  PACU - hemodynamically stable.

## 2016-02-24 NOTE — Op Note (Signed)
[id number] 

## 2016-02-25 ENCOUNTER — Encounter (HOSPITAL_BASED_OUTPATIENT_CLINIC_OR_DEPARTMENT_OTHER): Payer: Self-pay | Admitting: Orthopedic Surgery

## 2016-02-25 NOTE — Op Note (Signed)
NAME:  COLLAN, FACCHINI NO.:  [id number]  MEDICAL RECORD NO.:  [id number]  LOCATION:                                 FACILITY:  PHYSICIAN:  Daryll Brod, M.D.            DATE OF BIRTH:  DATE OF PROCEDURE:  02/24/2016 DATE OF DISCHARGE:                              OPERATIVE REPORT   PREOPERATIVE DIAGNOSIS:  Stenosing tenosynovitis, right thumb.  POSTOPERATIVE DIAGNOSIS:  Stenosing tenosynovitis, right thumb.  OPERATION:  Release A1 pulley, right thumb.  SURGEON:  Daryll Brod, MD.  ASSISTANT:  None.  ANESTHESIA:  Forearm IV regional with local infiltration.  ANESTHESIOLOGIST:  Green.  HISTORY:  The patient is a 66 year old male with a history of triggering of his right thumb, this has locked.  He is admitted now for release of the A1 pulley of the right thumb.  Pre, peri, and postoperative course have been discussed along with risks and complications.  He is aware that there is no guarantee to the surgery, the possibility of infection; recurrence of injury to arteries, nerves, tendons; incomplete relief of symptoms and dystrophy.  In the preoperative area, the patient was seen, the extremity marked by both patient and surgeon.  Antibiotic was also given.  PROCEDURE IN DETAIL:  The patient was brought to the operating room, where a forearm-based IV regional anesthetic was carried out without difficulty under the direction of the Anesthesia Department.  He was prepped using ChloraPrep in supine position with the right arm free.  A 3-minute dry time was allowed and a time-out taken, confirming the patient and procedure.  A transverse incision was then made over the A1 pulley of the right thumb.  This was carried down through subcutaneous tissue.  The neurovascular bundles radially and ulnarly were identified. Retractors were placed protecting each of these bundles.  The A1 pulley was found to be markedly thickened.  This was released on its radial central  aspect.  The oblique pulley was left intact.  Distinct area of compression to the tendon was immediately apparent.  The finger was able to be fully extended and flexed with no further triggering.  The wound was copiously irrigated with saline.  The skin was closed with interrupted 4-0 nylon sutures.  A local infiltration was given at the beginning of the procedure and that he still had some feeling with the dissection.  This was done with 0.25% bupivacaine without epinephrine and further was added at the end of the procedure for a total of 7 mL.  A sterile compressive dressing was applied after the suturing. The patient was taken to the recovery room for observation in satisfactory condition.  All fingers were left free so that he could flex.  He will be discharged to home to return to the [facility] in 1 week, on [place].          ______________________________ Daryll Brod, M.D.     GK/MEDQ  D:  02/24/2016  T:  02/25/2016  Job:  [id number]

## 2016-03-07 ENCOUNTER — Telehealth: Payer: Self-pay

## 2016-03-07 NOTE — Telephone Encounter (Signed)
Attempted call to pt with full VCM. Gregory Friedman

## 2016-03-07 NOTE — Telephone Encounter (Signed)
Pt request refill on simvastatin, metformin, fluticasone, enalapril, metoprolol, and omeprazole sent to [facility].

## 2016-03-07 NOTE — Telephone Encounter (Signed)
He was due for a med check last month.  He doesn't have one scheduled.  Needs to schedule fasting med check, then can refill meds just once

## 2016-03-09 NOTE — Telephone Encounter (Signed)
LMTCB, sent letter due to no callback.

## 2016-03-22 ENCOUNTER — Encounter: Payer: Self-pay | Admitting: Gastroenterology

## 2016-03-31 ENCOUNTER — Ambulatory Visit (INDEPENDENT_AMBULATORY_CARE_PROVIDER_SITE_OTHER): Payer: Medicare HMO | Admitting: Gastroenterology

## 2016-03-31 ENCOUNTER — Encounter: Payer: Self-pay | Admitting: Gastroenterology

## 2016-03-31 ENCOUNTER — Other Ambulatory Visit (INDEPENDENT_AMBULATORY_CARE_PROVIDER_SITE_OTHER): Payer: Medicare HMO

## 2016-03-31 VITALS — BP 170/70 | HR 56 | Ht 67.5 in | Wt 194.4 lb

## 2016-03-31 DIAGNOSIS — R1310 Dysphagia, unspecified: Secondary | ICD-10-CM

## 2016-03-31 DIAGNOSIS — R197 Diarrhea, unspecified: Secondary | ICD-10-CM | POA: Diagnosis not present

## 2016-03-31 DIAGNOSIS — R140 Abdominal distension (gaseous): Secondary | ICD-10-CM

## 2016-03-31 DIAGNOSIS — Z86010 Personal history of colonic polyps: Secondary | ICD-10-CM

## 2016-03-31 LAB — IGA: IgA: 190 mg/dL (ref 68–378)

## 2016-03-31 LAB — MAGNESIUM: MAGNESIUM: 1.6 mg/dL (ref 1.5–2.5)

## 2016-03-31 MED ORDER — NA SULFATE-K SULFATE-MG SULF 17.5-3.13-1.6 GM/177ML PO SOLN: 1.0000 | ORAL | 0 refills | Status: DC

## 2016-03-31 MED ORDER — RANITIDINE HCL 150 MG PO TABS: 150.0000 mg | ORAL_TABLET | Freq: Every day | ORAL | 5 refills | Status: DC

## 2016-03-31 NOTE — Patient Instructions (Addendum)
We have sent the following medications to your pharmacy for you to pick up at your convenience: Ranitidine 150 mg daily  Stop Omeprazole   Start daily probiotic such as florastor or culturelle or align.   Take IB gard as needed for symptoms.    Your physician has requested that you go to the basement for  lab work before leaving today.  You have been scheduled for a colonoscopy. Please follow written instructions given to you at your visit today.  Please pick up your prep supplies at the pharmacy within the next 1-3 days. If you use inhalers (even only as needed), please bring them with you on the day of your procedure. Your physician has requested that you go to www.startemmi.com and enter the access code given to you at your visit today. This web site gives a general overview about your procedure. However, you should still follow specific instructions given to you by our office regarding your preparation for the procedure.  You have been scheduled for a Barium Esophogram at [facility] (1st floor of the hospital) on 04-04-16 at 9 am. Please arrive 15 minutes prior to your appointment for registration. Make certain not to have anything to eat or drink 3 hours prior to your test. If you need to reschedule for any reason, please contact radiology at [phone] to do so. __________________________________________________________________ A barium swallow is an examination that concentrates on views of the esophagus. This tends to be a double contrast exam (barium and two liquids which, when combined, create a gas to distend the wall of the oesophagus) or single contrast (non-ionic iodine based). The study is usually tailored to your symptoms so a good history is essential. Attention is paid during the study to the form, structure and configuration of the esophagus, looking for functional disorders (such as aspiration, dysphagia, achalasia, motility and reflux) EXAMINATION You may be  asked to change into a gown, depending on the type of swallow being performed. A radiologist and radiographer will perform the procedure. The radiologist will advise you of the type of contrast selected for your procedure and direct you during the exam. You will be asked to stand, sit or lie in several different positions and to hold a small amount of fluid in your mouth before being asked to swallow while the imaging is performed .In some instances you may be asked to swallow barium coated marshmallows to assess the motility of a solid food bolus. The exam can be recorded as a digital or video fluoroscopy procedure. POST PROCEDURE It will take 1-2 days for the barium to pass through your system. To facilitate this, it is important, unless otherwise directed, to increase your fluids for the next 24-48hrs and to resume your normal diet.  This test typically takes about 30 minutes to perform. __________________________________________________________________________________

## 2016-03-31 NOTE — Progress Notes (Addendum)
03/31/2016 Gregory Friedman [id number] [birth year]   HISTORY OF PRESENT ILLNESS:  This is a 66 year old male who is previously known to Dr. Deatra Ina, last seen in 2013.  His care will be assumed by Dr. Carlean Purl.  He presents to our office today with several complaints.  First, he complains of diffuse abdominal bloating and gas/belching after eating anything. He also complains of back pain on the lower right side that he was wondering if it was due to gas. He has intermittent diarrhea, sometimes he will have a loose bowel movement 3 times a day and sometimes not at all. This has been going on intermittently for years, in fact, he had complaints of diarrhea at the time of his last colonoscopy.  He denies abdominal pain and blood in his stools.  He also reports very intermittent and random dysphasia, usually just to solid foods. He is on omeprazole 40 mg daily and has been taking that for several years. He is wondering if the medicine is contributing to some of his abdominal complaints.  His last colonoscopy was in 01/2011 at which time he was found to have one polyp that was removed and was a serrated adenoma.  Also had melanosis coli.  Random biopsies were normal except for melanosis coli.  EGD at the same time showed a nodule in the cardia that was a benign fundic gland polyp on pathology.  There was also a stricture at the GE junction that was dilated with [facility] dilator.     Past Medical History:  Diagnosis Date  . Anxiety   . BPH (benign prostatic hyperplasia)   . Colon polyps   . Coronary artery disease   . Depression   . Diverticulosis   . GERD (gastroesophageal reflux disease)   . History of gout    toe  . 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 Hyperlipidemia   . Hypertension    states under control with med., has been on med. since 2003  . 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 Myocardial  infarction   . Non-insulin dependent type 2 diabetes mellitus ([place])   . PONV (postoperative nausea and vomiting)   . S/P radiation therapy    Received 8 fractions, then stopped due to mucositis with Radiation/ Cisplatin  . Status post dilation of esophageal narrowing   . Trigger thumb of right hand 01/2016   Past Surgical History:  Procedure Laterality Date  . CARDIAC CATHETERIZATION  05/25/2010  . CARDIOVASCULAR STRESS TEST  07/2009  . COLONOSCOPY WITH PROPOFOL  02/16/2011  . CORONARY ANGIOPLASTY  2003; 2006   2 stents in heart  . CORONARY ARTERY BYPASS GRAFT  2005  . CYSTOSCOPY  02/09/2011   Procedure: CYSTOSCOPY;  Surgeon: Marissa Nestle, MD;  Location: [facility];  Service: Urology;  Laterality: N/A;  . ESOPHAGOGASTRODUODENOSCOPY (EGD) WITH ESOPHAGEAL DILATION  02/16/2011   with Propofol  . FOOT ARTHRODESIS, SUBTALAR Right 10/13/2009  . MASS EXCISION N/A 03/13/2012   Procedure: EXCISION LEFT BUCCAL MUCOSA;  Surgeon: Rozetta Nunnery, MD;  Location: [place];  Service: ENT;  Laterality: N/A;  . RADICAL NECK DISSECTION Left 03/13/2012   Procedure: RADICAL NECK DISSECTION;  Surgeon: Rozetta Nunnery, MD;  Location: [place];  Service: ENT;  Laterality: Left;  . TOOTH EXTRACTION N/A 03/13/2012   Procedure: EXTRACTION MOLARS;  Surgeon: Rozetta Nunnery, MD;  Location: [place];  Service: ENT;  Laterality: N/A;  . TRIGGER FINGER RELEASE Right 02/24/2016   Procedure: RELEASE TRIGGER FINGER/A-1 PULLEY RIGHT THUMB;  Surgeon: Daryll Brod, MD;  Location: [place];  Service: Orthopedics;  Laterality: Right;  FAB    reports that he has never smoked. He has never used smokeless tobacco. He reports that he does not drink alcohol or use drugs. family history includes Asthma in his father; Diabetes in his brother, brother, and sister; Heart disease in his brother; Parkinson's disease in his brother; Seizures in his mother; Stroke in his sister; Thyroid cancer in his daughter. No Known  Allergies    Outpatient Encounter Prescriptions as of 03/31/2016  Medication Sig  . aspirin 81 MG tablet Take 81 mg by mouth daily.  . enalapril (VASOTEC) 10 MG tablet Take 1 tablet (10 mg total) by mouth daily.  [name] glucose blood test strip Use as instructed  . Lancets MISC Please test blood sugars daily  . metFORMIN (GLUCOPHAGE-XR) 500 MG 24 hr tablet TAKE 2 TABLETS BY MOUTH  DAILY WITH BREAKFAST  . metoprolol succinate (TOPROL-XL) 50 MG 24 hr tablet Take 1 tablet (50 mg total) by mouth daily.  . Multiple Vitamins-Minerals (MULTIVITAMIN WITH MINERALS) tablet Take 1 tablet by mouth 2 (two) times daily.   [name] omeprazole (PRILOSEC) 40 MG capsule TAKE 1 CAPSULE BY MOUTH  DAILY  . Saw Palmetto, Serenoa repens, 320 MG CAPS Take 1 capsule by mouth 2 (two) times daily.   . simvastatin (ZOCOR) 40 MG tablet TAKE 1 TABLET BY MOUTH AT  BEDTIME  . nitroGLYCERIN (NITROSTAT) 0.4 MG SL tablet Place 1 tablet (0.4 mg total) under the tongue every 5 (five) minutes as needed. Chest pain (Patient not taking: Reported on 03/31/2016)  . [DISCONTINUED] HYDROcodone-acetaminophen (NORCO) 5-325 MG tablet Take 1 tablet by mouth every 6 (six) hours as needed for moderate pain.   No facility-administered encounter medications on file as of 03/31/2016.      REVIEW OF SYSTEMS  : All other systems reviewed and negative except where noted in the History of Present Illness.   PHYSICAL EXAM: BP (!) 170/70 (BP Location: Left Arm, Patient Position: Sitting, Cuff Size: Normal)   Pulse (!) 56   Ht 5' 7.5" (1.715 m) Comment: height measured without shoes  Wt 194 lb 6 oz (88.2 kg)   BMI 29.99 kg/m  General: Well developed male in no acute distress Head: Normocephalic and atraumatic Eyes:  Sclerae anicteric, conjunctiva pink. Ears: Normal auditory acuity. Lungs: Clear throughout to auscultation; no increased WOB. Heart: Regular rate and rhythm Abdomen: Soft, non-distended.  Normal bowel sounds.  Non-tender. Rectal:  Will be  done at the time of colonoscopy. Musculoskeletal: Symmetrical with no gross deformities.  Pinpoint tenderness over paraspinal muscles on the right. Skin: No lesions on visible extremities Extremities: No edema  Neurological: Alert oriented x 4, grossly non-focal Psychological:  Alert and cooperative. Normal mood and affect  ASSESSMENT AND PLAN: -Personal history of colon polyps:  Adenoma in 01/2011.  Will schedule for surveillance colonoscopy with Dr. Carlean Purl.  The risks, benefits, and alternatives to colonoscopy were discussed with the patient and he consents to proceed.  -Dysphagia:  Very intermittent dysphagia.  Has history of stricture but due to symptoms occurring so randomly, will perform barium esophagram first. -Symptoms of bloating/gas, intermittent diarrhea (symptoms present for years on and off):  Likely represent IBS.  ? If diarrhea in part secondary to metformin.  Will try a daily probiotic as well as IBgard prn.  He thinks that some of his symptoms are coming from the omeprazole.  I said to try discontinuing the omeprazole for now and we can place him on zantac 150 mg daily instead.  Will check celiac labs also. -Back pain:  I believe that this is musculoskeletal as there is no corresponding abdominal pain and he has pinpoint tenderness over his paraspinal muscles. Will follow up with PCP regarding this issue.   CC:  Rita Ohara, MD  Agree with Ms. Alphia Kava management.  Gatha Mayer, MD, [facility]

## 2016-04-03 LAB — TISSUE TRANSGLUTAMINASE, IGA: TISSUE TRANSGLUTAMINASE AB, IGA: 1 U/mL (ref <4)

## 2016-04-04 ENCOUNTER — Ambulatory Visit (HOSPITAL_COMMUNITY)
Admission: RE | Admit: 2016-04-04 | Discharge: 2016-04-04 | Disposition: A | Discharge status: Home / Self Care | Payer: Medicare HMO | Source: Ambulatory Visit | Attending: Gastroenterology | Admitting: Gastroenterology

## 2016-04-04 ENCOUNTER — Encounter: Payer: Self-pay | Admitting: Gastroenterology

## 2016-04-04 DIAGNOSIS — K224 Dyskinesia of esophagus: Secondary | ICD-10-CM | POA: Insufficient documentation

## 2016-04-04 DIAGNOSIS — Z860100 Personal history of colon polyps, unspecified: Secondary | ICD-10-CM | POA: Insufficient documentation

## 2016-04-04 DIAGNOSIS — R1310 Dysphagia, unspecified: Secondary | ICD-10-CM | POA: Insufficient documentation

## 2016-04-04 DIAGNOSIS — K219 Gastro-esophageal reflux disease without esophagitis: Secondary | ICD-10-CM | POA: Diagnosis not present

## 2016-04-04 DIAGNOSIS — R197 Diarrhea, unspecified: Secondary | ICD-10-CM | POA: Insufficient documentation

## 2016-04-04 DIAGNOSIS — K449 Diaphragmatic hernia without obstruction or gangrene: Secondary | ICD-10-CM | POA: Insufficient documentation

## 2016-04-04 DIAGNOSIS — R140 Abdominal distension (gaseous): Secondary | ICD-10-CM | POA: Insufficient documentation

## 2016-04-11 ENCOUNTER — Encounter: Payer: Self-pay | Admitting: Internal Medicine

## 2016-04-14 ENCOUNTER — Telehealth: Payer: Self-pay | Admitting: Internal Medicine

## 2016-04-14 NOTE — Telephone Encounter (Signed)
Patient returning phone call 

## 2016-04-14 NOTE — Telephone Encounter (Signed)
Pt has been notified     Notes Recorded by Loralie Champagne, PA-C on 04/04/2016 at 10:32 AM EDT Please let the patient know that his magnesium level was normal. Celiac labs were negative. Esophagram showed mild dysmotility and a small hiatal hernia with mild reflux. No sign of stricture, so as we discussed no need for endoscopy for dilation, etc. His dysphagia is likely secondary to the dysmotility and reflux he is having.  Thank you,  Jess

## 2016-04-24 ENCOUNTER — Telehealth: Payer: Self-pay | Admitting: Internal Medicine

## 2016-04-25 ENCOUNTER — Encounter: Payer: Medicare HMO | Admitting: Internal Medicine

## 2016-05-08 NOTE — Telephone Encounter (Signed)
No charge on this Did he reschedule?

## 2016-05-23 NOTE — Telephone Encounter (Signed)
Left a message for patient to call back so I can reschedule his procedure.

## 2016-05-29 DIAGNOSIS — E785 Hyperlipidemia, unspecified: Secondary | ICD-10-CM | POA: Diagnosis not present

## 2016-05-29 DIAGNOSIS — E559 Vitamin D deficiency, unspecified: Secondary | ICD-10-CM | POA: Diagnosis not present

## 2016-05-29 DIAGNOSIS — I119 Hypertensive heart disease without heart failure: Secondary | ICD-10-CM | POA: Diagnosis not present

## 2016-05-29 DIAGNOSIS — R7309 Other abnormal glucose: Secondary | ICD-10-CM | POA: Diagnosis not present

## 2016-05-29 DIAGNOSIS — I2583 Coronary atherosclerosis due to lipid rich plaque: Secondary | ICD-10-CM | POA: Diagnosis not present

## 2016-05-29 DIAGNOSIS — R946 Abnormal results of thyroid function studies: Secondary | ICD-10-CM | POA: Diagnosis not present

## 2016-05-30 ENCOUNTER — Encounter: Payer: Self-pay | Admitting: Internal Medicine

## 2016-05-31 DIAGNOSIS — R1313 Dysphagia, pharyngeal phase: Secondary | ICD-10-CM | POA: Diagnosis not present

## 2016-06-06 ENCOUNTER — Other Ambulatory Visit: Payer: Self-pay | Admitting: Otolaryngology

## 2016-06-06 DIAGNOSIS — C060 Malignant neoplasm of cheek mucosa: Secondary | ICD-10-CM

## 2016-06-08 ENCOUNTER — Ambulatory Visit
Admission: RE | Admit: 2016-06-08 | Discharge: 2016-06-08 | Disposition: A | Discharge status: Home / Self Care | Payer: Medicare HMO | Source: Ambulatory Visit | Attending: Otolaryngology | Admitting: Otolaryngology

## 2016-06-08 DIAGNOSIS — C060 Malignant neoplasm of cheek mucosa: Secondary | ICD-10-CM

## 2016-06-08 DIAGNOSIS — R221 Localized swelling, mass and lump, neck: Secondary | ICD-10-CM | POA: Diagnosis not present

## 2016-06-08 MED ORDER — IOPAMIDOL (ISOVUE-300) INJECTION 61%
75.0000 mL | Freq: Once | INTRAVENOUS | Status: AC | PRN
  Administered 2016-06-08: 75 mL via INTRAVENOUS

## 2016-06-15 NOTE — Progress Notes (Signed)
Cardiology Office Note   Date:  06/16/2016   ID:  Gregory Friedman, DOB 1950-09-29, MRN [id number]  PCP:  [name], MD  Cardiologist:   Dorris Carnes, MD    F/U of CAD      History of Present Illness: Gregory Friedman is a 66 y.o. male with a history ofCAD s/p PCI and CABG (2005 in [place]), hypertension, type 2 diabetes, dyslipidemia, and anxiety.   Cath in 2012 which showed LIMA to LAD atretic, SVG to OM occluded. There were multiple areas which could be causing chest pain or ischemia ( severe disease in distal LCx or mod disease in diag).    He presented to the [facility] ED on 07/15/15 with an episode of chest pressure followed by palpitations. He ruled out for MI and underwent nuclear stress test which showed old infarct pattern with no ischemia.   Seen in November 2017 by Gregory Friedman  Complained of palpitations, SOB  Recomm increased toprol and an event monitor  Event monitor showed no arrhythmias Echo done LVEF 45 to 50% (unchanged)    Since seen he denies palpitations   Denies CP  Breathing is OK  BP 130s       Current Meds  Medication Sig  . aspirin 81 MG tablet Take 81 mg by mouth daily.  . enalapril (VASOTEC) 10 MG tablet Take 1 tablet (10 mg total) by mouth daily.  [name] glucose blood test strip Use as instructed  . Lancets MISC Please test blood sugars daily  . metFORMIN (GLUCOPHAGE-XR) 500 MG 24 hr tablet TAKE 2 TABLETS BY MOUTH  DAILY WITH BREAKFAST  . metoprolol succinate (TOPROL-XL) 50 MG 24 hr tablet Take 1 tablet (50 mg total) by mouth daily.  . Multiple Vitamins-Minerals (MULTIVITAMIN WITH MINERALS) tablet Take 1 tablet by mouth 2 (two) times daily.   . Na Sulfate-K Sulfate-Mg Sulf (SUPREP BOWEL PREP KIT) 17.5-3.13-1.6 GM/180ML SOLN Take 1 kit by mouth as directed.  . nitroGLYCERIN (NITROSTAT) 0.4 MG SL tablet Place 1 tablet (0.4 mg total) under the tongue every 5 (five) minutes as needed. Chest pain  . omeprazole (PRILOSEC) 40 MG capsule TAKE 1 CAPSULE BY  MOUTH  DAILY  . ranitidine (ZANTAC) 150 MG tablet Take 1 tablet (150 mg total) by mouth daily.  . Saw Palmetto, Serenoa repens, 320 MG CAPS Take 1 capsule by mouth 2 (two) times daily.   . simvastatin (ZOCOR) 40 MG tablet TAKE 1 TABLET BY MOUTH AT  BEDTIME     Allergies:   Patient has no known allergies.   Past Medical History:  Diagnosis Date  . Anxiety   . BPH (benign prostatic hyperplasia)   . Colon polyps   . Coronary artery disease   . Depression   . Diverticulosis   . GERD (gastroesophageal reflux disease)   . History of gout    toe  . 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 Hyperlipidemia   . Hypertension    states under control with med., has been on med. since 2003  . 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 Myocardial infarction ([place])   . Non-insulin dependent type 2 diabetes mellitus ([place])   . PONV (postoperative nausea and vomiting)   . S/P radiation therapy    Received 8 fractions, then stopped due to mucositis with Radiation/ Cisplatin  . Status post dilation of esophageal narrowing   . Trigger  thumb of right hand 01/2016    Past Surgical History:  Procedure Laterality Date  . CARDIAC CATHETERIZATION  05/25/2010  . CARDIOVASCULAR STRESS TEST  07/2009  . COLONOSCOPY WITH PROPOFOL  02/16/2011  . CORONARY ANGIOPLASTY  2003; 2006   2 stents in heart  . CORONARY ARTERY BYPASS GRAFT  2005  . CYSTOSCOPY  02/09/2011   Procedure: CYSTOSCOPY;  Surgeon: Mohammad I Javaid, MD;  Location: [facility];  Service: Urology;  Laterality: N/A;  . ESOPHAGOGASTRODUODENOSCOPY (EGD) WITH ESOPHAGEAL DILATION  02/16/2011   with Propofol  . FOOT ARTHRODESIS, SUBTALAR Right 10/13/2009  . MASS EXCISION N/A 03/13/2012   Procedure: EXCISION LEFT BUCCAL MUCOSA;  Surgeon: Christopher E Newman, MD;  [facility];  Service: ENT;  Laterality: N/A;  . RADICAL NECK DISSECTION Left 03/13/2012   Procedure: RADICAL  NECK DISSECTION;  Surgeon: Christopher E Newman, MD;  [facility];  Service: ENT;  Laterality: Left;  . TOOTH EXTRACTION N/A 03/13/2012   Procedure: EXTRACTION MOLARS;  Surgeon: Christopher E Newman, MD;  [facility];  Service: ENT;  Laterality: N/A;  . TRIGGER FINGER RELEASE Right 02/24/2016   Procedure: RELEASE TRIGGER FINGER/A-1 PULLEY RIGHT THUMB;  Surgeon: Gary Kuzma, MD;  Location: [facility];  Service: Orthopedics;  Laterality: Right;  FAB     Social History:  The patient  reports that he has never smoked. He has never used smokeless tobacco. He reports that he does not drink alcohol or use drugs.   Family History:  The patient's family history includes Asthma in his father; Diabetes in his brother, brother, and sister; Heart disease in his brother; Parkinson's disease in his brother; Seizures in his mother; Stroke in his sister; Thyroid cancer in his daughter.    ROS:  Please see the history of present illness. All other systems are reviewed and  Negative to the above problem except as noted.    PHYSICAL EXAM: VS:  BP (!) 142/82   Pulse (!) 59   Ht 5' 7.5" (1.715 m)   Wt 192 lb (87.1 kg)   SpO2 98%   BMI 29.63 kg/m   GEN: Well nourished, well developed, in no acute distress  HEENT: normal  Neck: no JVD, carotid bruits, or masses Cardiac: RRR; no murmurs, rubs, or gallops,no edema  Respiratory:  clear to auscultation bilaterally, normal work of breathing GI: soft, nontender, nondistended, + BS  No hepatomegaly  MS: no deformity Moving all extremities   Skin: warm and dry, no rash Neuro:  Strength and sensation are intact Psych: euthymic mood, full affect   EKG:  EKG is not ordered today.   Lipid Panel    Component Value Date/Time   CHOL 109 (L) 04/19/2015 0001   TRIG 134 04/19/2015 0001   HDL 42 04/19/2015 0001   CHOLHDL 2.6 04/19/2015 0001   VLDL 27 04/19/2015 0001   LDLCALC 40 04/19/2015 0001      Wt Readings from Last 3 Encounters:    06/16/16 192 lb (87.1 kg)  03/31/16 194 lb 6 oz (88.2 kg)  02/24/16 190 lb (86.2 kg)      ASSESSMENT AND PLAN:  1  CAD  No symptoms of angina  Stay acitvie  2  HTN  BP is better at home  I want him to take it more regularly  Bring cuff to next doctors appt to make sure accurate  Call if running above 140    3  HL  Will get labs from Dr Sanders      F?U in January     Current medicines are reviewed at length with the patient today.  The patient does not have concerns regarding medicines.  Signed, Dorris Carnes, MD  06/16/2016 11:08 AM    [facility] [address], [place], [place]  [address] Phone: [phone]; Fax: [phone]

## 2016-06-16 ENCOUNTER — Ambulatory Visit (INDEPENDENT_AMBULATORY_CARE_PROVIDER_SITE_OTHER): Payer: Medicare HMO | Admitting: Internal Medicine

## 2016-06-16 ENCOUNTER — Encounter: Payer: Self-pay | Admitting: Internal Medicine

## 2016-06-16 VITALS — BP 142/82 | HR 59 | Ht 67.5 in | Wt 192.0 lb

## 2016-06-16 DIAGNOSIS — R002 Palpitations: Secondary | ICD-10-CM

## 2016-06-16 DIAGNOSIS — I2510 Atherosclerotic heart disease of native coronary artery without angina pectoris: Secondary | ICD-10-CM | POA: Diagnosis not present

## 2016-06-16 DIAGNOSIS — I10 Essential (primary) hypertension: Secondary | ICD-10-CM | POA: Diagnosis not present

## 2016-06-16 MED ORDER — NITROGLYCERIN 0.4 MG SL SUBL: 0.4000 mg | SUBLINGUAL_TABLET | SUBLINGUAL | 3 refills | Status: AC | PRN

## 2016-06-16 NOTE — Patient Instructions (Addendum)
Your physician recommends that you continue on your current medications as directed. Please refer to the Current Medication list given to you today.  Your physician wants you to follow-up in: January, 2019 with Dr. Harrington Challenger.  You will receive a reminder letter in the mail two months in advance. If you don't receive a letter, please call our office to schedule the follow-up appointment.  Addendum: labs requested from Dr. Lynder Parents office 06/21/16.

## 2016-06-21 DIAGNOSIS — R1313 Dysphagia, pharyngeal phase: Secondary | ICD-10-CM | POA: Diagnosis not present

## 2016-07-06 ENCOUNTER — Telehealth: Payer: Self-pay

## 2016-07-06 NOTE — Telephone Encounter (Signed)
Patient has transferred care to Dr. Baird Cancer and request records to be faxed to their office. Records faxed.

## 2016-07-19 DIAGNOSIS — R1313 Dysphagia, pharyngeal phase: Secondary | ICD-10-CM | POA: Diagnosis not present

## 2016-09-15 DIAGNOSIS — E1165 Type 2 diabetes mellitus with hyperglycemia: Secondary | ICD-10-CM | POA: Diagnosis not present

## 2016-09-15 DIAGNOSIS — R946 Abnormal results of thyroid function studies: Secondary | ICD-10-CM | POA: Diagnosis not present

## 2016-09-15 DIAGNOSIS — M109 Gout, unspecified: Secondary | ICD-10-CM | POA: Diagnosis not present

## 2016-09-15 DIAGNOSIS — R351 Nocturia: Secondary | ICD-10-CM | POA: Diagnosis not present

## 2016-09-15 DIAGNOSIS — I2583 Coronary atherosclerosis due to lipid rich plaque: Secondary | ICD-10-CM | POA: Diagnosis not present

## 2016-09-15 DIAGNOSIS — I119 Hypertensive heart disease without heart failure: Secondary | ICD-10-CM | POA: Diagnosis not present

## 2016-09-15 DIAGNOSIS — R7309 Other abnormal glucose: Secondary | ICD-10-CM | POA: Diagnosis not present

## 2016-09-15 DIAGNOSIS — Z0000 Encounter for general adult medical examination without abnormal findings: Secondary | ICD-10-CM | POA: Diagnosis not present

## 2016-10-17 DIAGNOSIS — H43813 Vitreous degeneration, bilateral: Secondary | ICD-10-CM | POA: Diagnosis not present

## 2016-10-17 DIAGNOSIS — H524 Presbyopia: Secondary | ICD-10-CM | POA: Diagnosis not present

## 2016-10-17 DIAGNOSIS — H2513 Age-related nuclear cataract, bilateral: Secondary | ICD-10-CM | POA: Diagnosis not present

## 2016-10-17 DIAGNOSIS — H40013 Open angle with borderline findings, low risk, bilateral: Secondary | ICD-10-CM | POA: Diagnosis not present

## 2016-10-17 DIAGNOSIS — E119 Type 2 diabetes mellitus without complications: Secondary | ICD-10-CM | POA: Diagnosis not present

## 2016-10-23 DIAGNOSIS — E039 Hypothyroidism, unspecified: Secondary | ICD-10-CM | POA: Diagnosis not present

## 2016-10-23 DIAGNOSIS — Z23 Encounter for immunization: Secondary | ICD-10-CM | POA: Diagnosis not present

## 2016-10-23 DIAGNOSIS — C4492 Squamous cell carcinoma of skin, unspecified: Secondary | ICD-10-CM | POA: Diagnosis not present

## 2016-10-23 DIAGNOSIS — E1165 Type 2 diabetes mellitus with hyperglycemia: Secondary | ICD-10-CM | POA: Diagnosis not present

## 2016-10-23 DIAGNOSIS — R630 Anorexia: Secondary | ICD-10-CM | POA: Diagnosis not present

## 2016-11-06 ENCOUNTER — Encounter: Payer: Self-pay | Admitting: Internal Medicine

## 2016-11-07 DIAGNOSIS — E1165 Type 2 diabetes mellitus with hyperglycemia: Secondary | ICD-10-CM | POA: Diagnosis not present

## 2016-12-13 DIAGNOSIS — I119 Hypertensive heart disease without heart failure: Secondary | ICD-10-CM | POA: Diagnosis not present

## 2016-12-13 DIAGNOSIS — Z7982 Long term (current) use of aspirin: Secondary | ICD-10-CM | POA: Diagnosis not present

## 2016-12-13 DIAGNOSIS — I2583 Coronary atherosclerosis due to lipid rich plaque: Secondary | ICD-10-CM | POA: Diagnosis not present

## 2017-01-16 DIAGNOSIS — R079 Chest pain, unspecified: Secondary | ICD-10-CM | POA: Diagnosis not present

## 2017-01-16 DIAGNOSIS — R002 Palpitations: Secondary | ICD-10-CM | POA: Diagnosis not present

## 2017-02-07 DIAGNOSIS — T783XXA Angioneurotic edema, initial encounter: Secondary | ICD-10-CM | POA: Diagnosis not present

## 2017-02-07 DIAGNOSIS — I119 Hypertensive heart disease without heart failure: Secondary | ICD-10-CM | POA: Diagnosis not present

## 2017-02-07 DIAGNOSIS — E1165 Type 2 diabetes mellitus with hyperglycemia: Secondary | ICD-10-CM | POA: Diagnosis not present

## 2017-02-07 DIAGNOSIS — I2583 Coronary atherosclerosis due to lipid rich plaque: Secondary | ICD-10-CM | POA: Diagnosis not present

## 2017-03-07 ENCOUNTER — Encounter: Payer: Self-pay | Admitting: Internal Medicine

## 2017-03-29 ENCOUNTER — Ambulatory Visit: Payer: Medicare HMO | Admitting: Internal Medicine

## 2017-03-29 ENCOUNTER — Encounter: Payer: Self-pay | Admitting: Internal Medicine

## 2017-03-29 VITALS — BP 162/88 | HR 59 | Ht 67.5 in | Wt 185.6 lb

## 2017-03-29 DIAGNOSIS — I2510 Atherosclerotic heart disease of native coronary artery without angina pectoris: Secondary | ICD-10-CM | POA: Diagnosis not present

## 2017-03-29 DIAGNOSIS — I10 Essential (primary) hypertension: Secondary | ICD-10-CM | POA: Diagnosis not present

## 2017-03-29 DIAGNOSIS — R002 Palpitations: Secondary | ICD-10-CM | POA: Diagnosis not present

## 2017-03-29 DIAGNOSIS — E782 Mixed hyperlipidemia: Secondary | ICD-10-CM | POA: Diagnosis not present

## 2017-03-29 MED ORDER — AMLODIPINE BESYLATE 2.5 MG PO TABS: 2.5000 mg | ORAL_TABLET | Freq: Every day | ORAL | 3 refills | Status: DC

## 2017-03-29 NOTE — Patient Instructions (Addendum)
Labs today  Start amlodipine 2.5 mg every day COntinue other meds   F/U in clinic 8 weeks  Bring blood pressure cuff with you on return.

## 2017-03-29 NOTE — Progress Notes (Signed)
Cardiology Office Note   Date:  03/29/2017   ID:  Gregory Friedman, DOB [birth year], MRN [id number]  PCP:  [name], MD  Cardiologist:   Dorris Carnes, MD    F/U of CAD      History of Present Illness: Gregory Friedman is a 67 y.o. male with a history ofCAD s/p PCI and CABG (2005 in [place]), hypertension, type 2 diabetes, dyslipidemia, and anxiety.   Cath in 2012 which showed LIMA to LAD atretic, SVG to OM occluded. There were multiple areas which could be causing chest pain or ischemia ( severe disease in distal LCx or mod disease in diag).    He presented to [facility] ED on 07/15/15 with an episode of chest pressure followed by palpitations. He ruled out for MI and underwent nuclear stress test which showed old infarct pattern with no ischemia.   Seen in November 2017 by Jens Som  Complained of palpitations, SOB  Recomm increased toprol and an event monitor  Event monitor showed no arrhythmias Echo done LVEF 45 to 50% (unchanged)    I saw the pt in May 2018  The pt denies CP  Breathing is OK   No dizziness   Current Meds  Medication Sig  . aspirin 81 MG tablet Take 81 mg by mouth daily.  . enalapril (VASOTEC) 10 MG tablet Take 1 tablet (10 mg total) by mouth daily.  [name] glucose blood test strip Use as instructed  . Lancets MISC Please test blood sugars daily  . metFORMIN (GLUCOPHAGE-XR) 500 MG 24 hr tablet TAKE 2 TABLETS BY MOUTH  DAILY WITH BREAKFAST  . metoprolol succinate (TOPROL-XL) 50 MG 24 hr tablet Take 1 tablet (50 mg total) by mouth daily.  . Multiple Vitamins-Minerals (MULTIVITAMIN WITH MINERALS) tablet Take 1 tablet by mouth 2 (two) times daily.   . Na Sulfate-K Sulfate-Mg Sulf (SUPREP BOWEL PREP KIT) 17.5-3.13-1.6 GM/180ML SOLN Take 1 kit by mouth as directed.  . nitroGLYCERIN (NITROSTAT) 0.4 MG SL tablet Place 1 tablet (0.4 mg total) under the tongue every 5 (five) minutes as needed. Chest pain  . omeprazole (PRILOSEC) 40 MG capsule TAKE 1 CAPSULE BY MOUTH   DAILY  . ranitidine (ZANTAC) 150 MG tablet Take 1 tablet (150 mg total) by mouth daily.  . Saw Palmetto, Serenoa repens, 320 MG CAPS Take 1 capsule by mouth 2 (two) times daily.   . simvastatin (ZOCOR) 40 MG tablet TAKE 1 TABLET BY MOUTH AT  BEDTIME     Allergies:   Patient has no known allergies.   Past Medical History:  Diagnosis Date  . Anxiety   . BPH (benign prostatic hyperplasia)   . Colon polyps   . Coronary artery disease   . Depression   . Diverticulosis   . GERD (gastroesophageal reflux disease)   . History of gout    toe  . 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 Hyperlipidemia   . Hypertension    states under control with med., has been on med. since 2003  . 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 Myocardial infarction ([place])   . Non-insulin dependent type 2 diabetes mellitus ([place])   . PONV (postoperative nausea and vomiting)   . S/P radiation therapy    Received 8 fractions, then stopped due to mucositis with Radiation/ Cisplatin  . Status post dilation of esophageal narrowing   . Trigger  thumb of right hand 01/2016    Past Surgical History:  Procedure Laterality Date  . CARDIAC CATHETERIZATION  05/25/2010  . CARDIOVASCULAR STRESS TEST  07/2009  . COLONOSCOPY WITH PROPOFOL  02/16/2011  . CORONARY ANGIOPLASTY  2003; 2006   2 stents in heart  . CORONARY ARTERY BYPASS GRAFT  2005  . CYSTOSCOPY  02/09/2011   Procedure: CYSTOSCOPY;  Surgeon: Marissa Nestle, MD;  Location: [facility];  Service: Urology;  Laterality: N/A;  . ESOPHAGOGASTRODUODENOSCOPY (EGD) WITH ESOPHAGEAL DILATION  02/16/2011   with Propofol  . FOOT ARTHRODESIS, SUBTALAR Right 10/13/2009  . MASS EXCISION N/A 03/13/2012   Procedure: EXCISION LEFT BUCCAL MUCOSA;  Surgeon: Rozetta Nunnery, MD;  Location: [place];  Service: ENT;  Laterality: N/A;  . RADICAL NECK DISSECTION Left 03/13/2012   Procedure: RADICAL NECK  DISSECTION;  Surgeon: Rozetta Nunnery, MD;  Location: [place];  Service: ENT;  Laterality: Left;  . TOOTH EXTRACTION N/A 03/13/2012   Procedure: EXTRACTION MOLARS;  Surgeon: Rozetta Nunnery, MD;  Location: [place];  Service: ENT;  Laterality: N/A;  . TRIGGER FINGER RELEASE Right 02/24/2016   Procedure: RELEASE TRIGGER FINGER/A-1 PULLEY RIGHT THUMB;  Surgeon: Daryll Brod, MD;  Location: [place];  Service: Orthopedics;  Laterality: Right;  FAB     Social History:  The patient  reports that  has never smoked. he has never used smokeless tobacco. He reports that he does not drink alcohol or use drugs.   Family History:  The patient's family history includes Asthma in his father; Diabetes in his brother, brother, and sister; Heart disease in his brother; Parkinson's disease in his brother; Seizures in his mother; Stroke in his sister; Thyroid cancer in his daughter.    ROS:  Please see the history of present illness. All other systems are reviewed and  Negative to the above problem except as noted.    PHYSICAL EXAM: VS:  BP (!) 162/88   Pulse (!) 59   Ht 5' 7.5" (1.715 m)   Wt 185 lb 9.6 oz (84.2 kg)   BMI 28.64 kg/m   GEN: Well nourished, well developed, in no acute distress  HEENT: normal  Neck: no JVD, carotid bruits, or masses Cardiac: RRR; no murmurs, rubs, or gallops,no edema  Respiratory:  clear to auscultation bilaterally, normal work of breathing GI: soft, nontender, nondistended, + BS  No hepatomegaly  MS: no deformity Moving all extremities   Skin: warm and dry, no rash Neuro:  Strength and sensation are intact Psych: euthymic mood, full affect   EKG:  EKG is ordered today. Sinus bradycardia  59 bpm     Lipid Panel    Component Value Date/Time   CHOL 109 (L) 04/19/2015 0001   TRIG 134 04/19/2015 0001   HDL 42 04/19/2015 0001   CHOLHDL 2.6 04/19/2015 0001   VLDL 27 04/19/2015 0001   LDLCALC 40 04/19/2015 0001      Wt Readings from Last 3  Encounters:  03/29/17 185 lb 9.6 oz (84.2 kg)  06/16/16 192 lb (87.1 kg)  03/31/16 194 lb 6 oz (88.2 kg)      ASSESSMENT AND PLAN:  1  CAD  No symptoms to sugg angina  Follow    2  HTN  BP is high  I would recomm starting low dose amlodipine 2.5 mg   Follow up in 8 wks  3  HL  Will get Lipid panel  Check CBC, BMET and TSH   F/U  in 8 wks     Current medicines are reviewed at length with the patient today.  The patient does not have concerns regarding medicines.  Signed, Dorris Carnes, MD  03/29/2017 4:33 PM    [facility] [address], [place], [place]  [address] Phone: [phone]; Fax: [phone]

## 2017-03-30 LAB — CBC
HEMOGLOBIN: 13.5 g/dL (ref 13.0–17.7)
Hematocrit: 40 % (ref 37.5–51.0)
MCH: 27.3 pg (ref 26.6–33.0)
MCHC: 33.8 g/dL (ref 31.5–35.7)
MCV: 81 fL (ref 79–97)
Platelets: 216 10*3/uL (ref 150–379)
RBC: 4.95 x10E6/uL (ref 4.14–5.80)
RDW: 14.5 % (ref 12.3–15.4)
WBC: 5.3 10*3/uL (ref 3.4–10.8)

## 2017-03-30 LAB — BASIC METABOLIC PANEL
BUN / CREAT RATIO: 15 (ref 10–24)
BUN: 19 mg/dL (ref 8–27)
CALCIUM: 9.8 mg/dL (ref 8.6–10.2)
CHLORIDE: 96 mmol/L (ref 96–106)
CO2: 29 mmol/L (ref 20–29)
CREATININE: 1.27 mg/dL (ref 0.76–1.27)
GFR calc non Af Amer: 58 mL/min/{1.73_m2} — ABNORMAL LOW (ref >59)
GFR, EST AFRICAN AMERICAN: 67 mL/min/{1.73_m2} (ref >59)
Glucose: 123 mg/dL — ABNORMAL HIGH (ref 65–99)
Potassium: 5.1 mmol/L (ref 3.5–5.2)
Sodium: 140 mmol/L (ref 134–144)

## 2017-03-30 LAB — LIPID PANEL
CHOL/HDL RATIO: 3.4 ratio (ref 0.0–5.0)
Cholesterol, Total: 156 mg/dL (ref 100–199)
HDL: 46 mg/dL (ref >39)
LDL Calculated: 67 mg/dL (ref 0–99)
TRIGLYCERIDES: 216 mg/dL — AB (ref 0–149)
VLDL CHOLESTEROL CAL: 43 mg/dL — AB (ref 5–40)

## 2017-03-30 LAB — TSH: TSH: 4.14 u[IU]/mL (ref 0.450–4.500)

## 2017-04-02 ENCOUNTER — Telehealth: Payer: Self-pay | Admitting: Internal Medicine

## 2017-04-02 NOTE — Telephone Encounter (Signed)
Sign

## 2017-05-15 DIAGNOSIS — R239 Unspecified skin changes: Secondary | ICD-10-CM | POA: Diagnosis not present

## 2017-05-15 DIAGNOSIS — I119 Hypertensive heart disease without heart failure: Secondary | ICD-10-CM | POA: Diagnosis not present

## 2017-05-15 DIAGNOSIS — E1165 Type 2 diabetes mellitus with hyperglycemia: Secondary | ICD-10-CM | POA: Diagnosis not present

## 2017-05-15 DIAGNOSIS — M79672 Pain in left foot: Secondary | ICD-10-CM | POA: Diagnosis not present

## 2017-05-15 DIAGNOSIS — I700 Atherosclerosis of aorta: Secondary | ICD-10-CM | POA: Diagnosis not present

## 2017-05-16 ENCOUNTER — Ambulatory Visit (AMBULATORY_SURGERY_CENTER): Payer: Self-pay | Admitting: *Deleted

## 2017-05-16 ENCOUNTER — Other Ambulatory Visit: Payer: Self-pay

## 2017-05-16 VITALS — Ht 67.5 in | Wt 188.0 lb

## 2017-05-16 DIAGNOSIS — Z86010 Personal history of colonic polyps: Secondary | ICD-10-CM

## 2017-05-16 NOTE — Progress Notes (Signed)
Patient denies any allergies to eggs or soy. Patient denies any problems with anesthesia/sedation. Patient denies any oxygen use at home. Patient denies taking any diet/weight loss medications or blood thinners. EMMI education declined by the pt. Suprep patient already has at home.

## 2017-05-17 ENCOUNTER — Encounter: Payer: Self-pay | Admitting: Internal Medicine

## 2017-05-21 LAB — TSH: TSH: 3.59 (ref 0.41–5.90)

## 2017-05-30 ENCOUNTER — Telehealth: Payer: Self-pay

## 2017-05-30 ENCOUNTER — Ambulatory Visit (AMBULATORY_SURGERY_CENTER): Payer: Medicare HMO | Admitting: Internal Medicine

## 2017-05-30 ENCOUNTER — Encounter: Payer: Self-pay | Admitting: Internal Medicine

## 2017-05-30 ENCOUNTER — Other Ambulatory Visit: Payer: Self-pay

## 2017-05-30 VITALS — BP 119/57 | HR 51 | Temp 97.5°F | Resp 13 | Ht 67.5 in | Wt 185.0 lb

## 2017-05-30 DIAGNOSIS — Z860100 Personal history of colon polyps, unspecified: Secondary | ICD-10-CM

## 2017-05-30 DIAGNOSIS — E119 Type 2 diabetes mellitus without complications: Secondary | ICD-10-CM | POA: Diagnosis not present

## 2017-05-30 DIAGNOSIS — N402 Nodular prostate without lower urinary tract symptoms: Secondary | ICD-10-CM

## 2017-05-30 DIAGNOSIS — I2510 Atherosclerotic heart disease of native coronary artery without angina pectoris: Secondary | ICD-10-CM | POA: Diagnosis not present

## 2017-05-30 DIAGNOSIS — I10 Essential (primary) hypertension: Secondary | ICD-10-CM | POA: Diagnosis not present

## 2017-05-30 MED ORDER — SODIUM CHLORIDE 0.9 % IV SOLN: 500.0000 mL | Freq: Once | INTRAVENOUS | Status: DC

## 2017-05-30 NOTE — Progress Notes (Signed)
Pt's states no medical or surgical changes since previsit or office visit. 

## 2017-05-30 NOTE — Op Note (Signed)
[facility] Patient Name: Gregory Friedman Procedure Date: 05/30/2017 9:11 AM MRN: [id number] Endoscopist: Gatha Mayer , MD Age: 67 Referring MD:  Date of Birth: [birth year] Gender: Male Account #: [id number] Procedure:                Colonoscopy Indications:              High risk colon cancer surveillance: Personal                            history of sessile serrated colon polyp (less than                            10 mm in size) with no dysplasia Medicines:                Propofol per Anesthesia, Monitored Anesthesia Care Procedure:                Pre-Anesthesia Assessment:                           - Prior to the procedure, a History and Physical                            was performed, and patient medications and                            allergies were reviewed. The patient's tolerance of                            previous anesthesia was also reviewed. The risks                            and benefits of the procedure and the sedation                            options and risks were discussed with the patient.                            All questions were answered, and informed consent                            was obtained. Prior Anticoagulants: The patient has                            taken no previous anticoagulant or antiplatelet                            agents. ASA Grade Assessment: III - A patient with                            severe systemic disease. After reviewing the risks                            and benefits, the patient was deemed in  satisfactory condition to undergo the procedure.                           After obtaining informed consent, the colonoscope                            was passed under direct vision. Throughout the                            procedure, the patient's blood pressure, pulse, and                            oxygen saturations were monitored continuously. The   Colonoscope was introduced through the anus and                            advanced to the the cecum, identified by                            appendiceal orifice and ileocecal valve. The                            colonoscopy was performed without difficulty. The                            patient tolerated the procedure well. The quality                            of the bowel preparation was excellent. The bowel                            preparation used was Miralax. The ileocecal valve,                            appendiceal orifice, and rectum were photographed. Scope In: 9:30:55 AM Scope Out: 9:42:41 AM Scope Withdrawal Time: 0 hours 8 minutes 37 seconds  Total Procedure Duration: 0 hours 11 minutes 46 seconds  Findings:                 The perianal examination was normal.                           The digital rectal exam findings include prostate                            nodule.                           Multiple diverticula were found in the sigmoid                            colon.                           The exam was otherwise without abnormality on  direct and retroflexion views. Complications:            No immediate complications. Estimated Blood Loss:     Estimated blood loss: none. Impression:               - A prostate nodule found on digital rectal exam.                            Tiny to right of median fissure in proximal right                            lobe                           - Diverticulosis in the sigmoid colon.                           - The examination was otherwise normal on direct                            and retroflexion views.                           - No specimens collected. Recommendation:           - Patient has a contact number available for                            emergencies. The signs and symptoms of potential                            delayed complications were discussed with the                             patient. Return to normal activities tomorrow.                            Written discharge instructions were provided to the                            patient.                           - Resume previous diet.                           - Continue present medications.                           - Repeat colonoscopy in 5 years for surveillance.                           - Will review prostate nodule with him and schedule                            GU eval enless this is known. Gatha Mayer, MD 05/30/2017 9:55:13 AM This report has been signed  electronically.

## 2017-05-30 NOTE — Telephone Encounter (Signed)
-----   Message from Gatha Mayer, MD sent at 05/30/2017 10:02 AM EDT ----- Regarding: Urology referral Please refer to [facility] re: prostate nodule on rectal exam

## 2017-05-30 NOTE — Progress Notes (Signed)
Report given to PACU, vss 

## 2017-05-30 NOTE — Patient Instructions (Addendum)
No polyps today! You do have diverticulosis - thickened muscle rings and pouches in the colon wall. Please read the handout about this condition.  I also felt a tiny nodule in the prostate gland. If this is new makes sense to have it checked.  Next routine colonoscopy or other screening test in 5 years 2024.  I appreciate the opportunity to care for you. Gatha Mayer, MD, FACG  YOU HAD AN ENDOSCOPIC PROCEDURE TODAY AT [facility]:   Refer to the procedure report that was given to you for any specific questions about what was found during the examination.  If the procedure report does not answer your questions, please call your gastroenterologist to clarify.  If you requested that your care partner not be given the details of your procedure findings, then the procedure report has been included in a sealed envelope for you to review at your convenience later.  YOU SHOULD EXPECT: Some feelings of bloating in the abdomen. Passage of more gas than usual.  Walking can help get rid of the air that was put into your GI tract during the procedure and reduce the bloating. If you had a lower endoscopy (such as a colonoscopy or flexible sigmoidoscopy) you may notice spotting of blood in your stool or on the toilet paper. If you underwent a bowel prep for your procedure, you may not have a normal bowel movement for a few days.  Please Note:  You might notice some irritation and congestion in your nose or some drainage.  This is from the oxygen used during your procedure.  There is no need for concern and it should clear up in a day or so.  SYMPTOMS TO REPORT IMMEDIATELY:   Following lower endoscopy (colonoscopy or flexible sigmoidoscopy):  Excessive amounts of blood in the stool  Significant tenderness or worsening of abdominal pains  Swelling of the abdomen that is new, acute  Fever of 100F or higher  For urgent or emergent issues, a gastroenterologist can be reached at any  hour by calling [phone].   DIET:  We do recommend a small meal at first, but then you may proceed to your regular diet.  Drink plenty of fluids but you should avoid alcoholic beverages for 24 hours.  ACTIVITY:  You should plan to take it easy for the rest of today and you should NOT DRIVE or use heavy machinery until tomorrow (because of the sedation medicines used during the test).    FOLLOW UP: Our staff will call the number listed on your records the next business day following your procedure to check on you and address any questions or concerns that you may have regarding the information given to you following your procedure. If we do not reach you, we will leave a message.  However, if you are feeling well and you are not experiencing any problems, there is no need to return our call.  We will assume that you have returned to your regular daily activities without incident.  If any biopsies were taken you will be contacted by phone or by letter within the next 1-3 weeks.  Please call us at [phone] if you have not heard about the biopsies in 3 weeks.    SIGNATURES/CONFIDENTIALITY: You and/or your care partner have signed paperwork which will be entered into your electronic medical record.  These signatures attest to the fact that that the information above on your After Visit Summary has been reviewed and is understood.  Full responsibility of the confidentiality of this discharge information lies with you and/or your care-partner.

## 2017-05-30 NOTE — Telephone Encounter (Signed)
Pt scheduled to see Dr. Karsten Ro with [facility] 06/06/17@9 :30am. Records faxed to [phone], pt aware of appt.

## 2017-05-31 ENCOUNTER — Telehealth: Payer: Self-pay

## 2017-05-31 NOTE — Telephone Encounter (Signed)
  Follow up Call-  Call back number 05/30/2017  Post procedure Call Back phone  # [phone]  Permission to leave phone message Yes  Some recent data might be hidden     Patient questions:  Do you have a fever, pain , or abdominal swelling? No. Pain Score  0 *  Have you tolerated food without any problems? Yes.    Have you been able to return to your normal activities? Yes.    Do you have any questions about your discharge instructions: Diet   No. Medications  No. Follow up visit  No.  Do you have questions or concerns about your Care? No.  Actions: * If pain score is 4 or above: No action needed, pain <4.

## 2017-06-06 DIAGNOSIS — N402 Nodular prostate without lower urinary tract symptoms: Secondary | ICD-10-CM | POA: Diagnosis not present

## 2017-06-25 ENCOUNTER — Ambulatory Visit: Payer: Medicare HMO | Admitting: Internal Medicine

## 2017-06-25 ENCOUNTER — Encounter: Payer: Self-pay | Admitting: Internal Medicine

## 2017-06-25 VITALS — BP 134/76 | HR 56 | Ht 67.5 in | Wt 190.0 lb

## 2017-06-25 DIAGNOSIS — E782 Mixed hyperlipidemia: Secondary | ICD-10-CM | POA: Diagnosis not present

## 2017-06-25 DIAGNOSIS — I10 Essential (primary) hypertension: Secondary | ICD-10-CM

## 2017-06-25 DIAGNOSIS — I2510 Atherosclerotic heart disease of native coronary artery without angina pectoris: Secondary | ICD-10-CM

## 2017-06-25 MED ORDER — METOPROLOL SUCCINATE ER 25 MG PO TB24: 25.0000 mg | ORAL_TABLET | Freq: Every day | ORAL | 3 refills | Status: DC

## 2017-06-25 NOTE — Progress Notes (Signed)
Cardiology Office Note   Date:  06/25/2017   ID:  Gregory Friedman, DOB [birth year], MRN [id number]  PCP:  [name], MD  Cardiologist:   Dorris Carnes, MD    F/U of CAD      History of Present Illness: Gregory Friedman is a 67 y.o. male with a history ofCAD s/p PCI and CABG (2005 in [place]), hypertension, type 2 diabetes, dyslipidemia, and anxiety.   Cath in 2012 which showed LIMA to LAD atretic, SVG to OM occluded. There were multiple areas which could be causing chest pain or ischemia ( severe disease in distal LCx or mod disease in diag).   CP in June 2017  Myovue done   Old infarct   No ischemia   Event monitor in November *(pt had palpitations)  No arrhythmias Echo done LVEF 45 to 50% (unchanged)    I saw the pt in March 2019   At that time I added low dose amlodipine since BP was  high   Lipids in March LDL 67   HDL 46  Trig 216   SInce seen he has done well  NO dizziness   No SOB  No CP   Doesn't walk much     Current Meds  Medication Sig  . amLODipine (NORVASC) 2.5 MG tablet Take 1 tablet (2.5 mg total) by mouth daily.  [name] antiseptic oral rinse (BIOTENE) LIQD   . aspirin 81 MG tablet Take 81 mg by mouth daily.  . enalapril (VASOTEC) 10 MG tablet Take 1 tablet (10 mg total) by mouth daily.  [name] glucose blood test strip Use as instructed  . Lancets MISC Please test blood sugars daily  . levothyroxine (SYNTHROID, LEVOTHROID) 25 MCG tablet Take 25 mcg by mouth daily before breakfast.  . metFORMIN (GLUCOPHAGE-XR) 500 MG 24 hr tablet TAKE 2 TABLETS BY MOUTH  DAILY WITH BREAKFAST (Patient taking differently: TAKE 2 TABLETS BY MOUTH  Twice a day)  . metoprolol succinate (TOPROL-XL) 50 MG 24 hr tablet Take 1 tablet (50 mg total) by mouth daily.  . Multiple Vitamins-Minerals (MULTIVITAMIN WITH MINERALS) tablet Take 1 tablet by mouth 2 (two) times daily.   . nitroGLYCERIN (NITROSTAT) 0.4 MG SL tablet Place 1 tablet (0.4 mg total) under the tongue every 5 (five) minutes  as needed. Chest pain  . omeprazole (PRILOSEC) 40 MG capsule TAKE 1 CAPSULE BY MOUTH  DAILY  . ranitidine (ZANTAC) 150 MG tablet Take 1 tablet (150 mg total) by mouth daily.  . Saw Palmetto, Serenoa repens, 320 MG CAPS Take 1 capsule by mouth 2 (two) times daily.   . simvastatin (ZOCOR) 40 MG tablet TAKE 1 TABLET BY MOUTH AT  BEDTIME   Current Facility-Administered Medications for the 06/25/17 encounter (Office Visit) with Fay Records, MD  Medication  . 0.9 %  sodium chloride infusion     Allergies:   Patient has no known allergies.   Past Medical History:  Diagnosis Date  . Anxiety   . BPH (benign prostatic hyperplasia)   . Cancer ([place]) 2014   neck area  . Colon polyps   . Coronary artery disease   . Depression   . Diverticulosis   . GERD (gastroesophageal reflux disease)   . History of gout    toe  . 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 Hyperlipidemia   . Hypertension    states under control with med., has been on med. since 2003  . Limited joint  range of motion    limited mouth opening and neck ROM s/p radical neck dissection  . Myocardial infarction ([place]) 2003  . Non-insulin dependent type 2 diabetes mellitus ([place])   . PONV (postoperative nausea and vomiting)   . S/P radiation therapy    Received 8 fractions, then stopped due to mucositis with Radiation/ Cisplatin  . Status post dilation of esophageal narrowing   . Trigger thumb of right hand 01/2016    Past Surgical History:  Procedure Laterality Date  . CARDIAC CATHETERIZATION  05/25/2010  . CARDIOVASCULAR STRESS TEST  07/2009  . COLONOSCOPY WITH PROPOFOL  02/16/2011  . CORONARY ANGIOPLASTY  2003; 2006   2 stents in heart  . CORONARY ARTERY BYPASS GRAFT  2005  . CYSTOSCOPY  02/09/2011   Procedure: CYSTOSCOPY;  Surgeon: Marissa Nestle, MD;  Location: [facility];  Service: Urology;  Laterality: N/A;  . ESOPHAGOGASTRODUODENOSCOPY (EGD) WITH ESOPHAGEAL DILATION  02/16/2011   with  Propofol  . FOOT ARTHRODESIS, SUBTALAR Right 10/13/2009  . MASS EXCISION N/A 03/13/2012   Procedure: EXCISION LEFT BUCCAL MUCOSA;  Surgeon: Rozetta Nunnery, MD;  Location: [facility];  Service: ENT;  Laterality: N/A;  . RADICAL NECK DISSECTION Left 03/13/2012   Procedure: RADICAL NECK DISSECTION;  Surgeon: Rozetta Nunnery, MD;  Location: [facility];  Service: ENT;  Laterality: Left;  . TOOTH EXTRACTION N/A 03/13/2012   Procedure: EXTRACTION MOLARS;  Surgeon: Rozetta Nunnery, MD;  Location: [facility];  Service: ENT;  Laterality: N/A;  . TRIGGER FINGER RELEASE Right 02/24/2016   Procedure: RELEASE TRIGGER FINGER/A-1 PULLEY RIGHT THUMB;  Surgeon: Daryll Brod, MD;  Location: [facility];  Service: Orthopedics;  Laterality: Right;  FAB     Social History:  The patient  reports that he has never smoked. He has never used smokeless tobacco. He reports that he does not drink alcohol or use drugs.   Family History:  The patient's family history includes Asthma in his father; Diabetes in his brother, brother, and sister; Heart disease in his brother; Parkinson's disease in his brother; Seizures in his mother; Stroke in his sister; Thyroid cancer in his daughter.    ROS:  Please see the history of present illness. All other systems are reviewed and  Negative to the above problem except as noted.    PHYSICAL EXAM: VS:  BP 134/76   Pulse (!) 56   Ht 5' 7.5" (1.715 m)   Wt 86.2 kg (190 lb)   SpO2 98%   BMI 29.32 kg/m   GEN: Well nourished, well developed, in no acute distress  HEENT: normal  Neck:  JVP is normal  No carotid bruits, or masses Cardiac: RRR; no murmurs, rubs, or gallops,no edema  Respiratory:  clear to auscultation bilaterally, normal work of breathing GI: soft, nontender, nondistended, + BS  No hepatomegaly  MS: no deformity Moving all extremities   Skin: warm and dry, no rash Neuro:  Strength and sensation are intact Psych: euthymic mood, full affect   EKG:  EKG is  not ordered today.   Lipid Panel    Component Value Date/Time   CHOL 156 03/29/2017 1658   TRIG 216 (H) 03/29/2017 1658   HDL 46 03/29/2017 1658   CHOLHDL 3.4 03/29/2017 1658   CHOLHDL 2.6 04/19/2015 0001   VLDL 27 04/19/2015 0001   LDLCALC 67 03/29/2017 1658      Wt Readings from Last 3 Encounters:  06/25/17 86.2 kg (190 lb)  05/30/17 83.9 kg (185  lb)  05/16/17 85.3 kg (188 lb)      ASSESSMENT AND PLAN:  1  CAD  No symptoms to sugg angina  Follow    2  HTN BP is better  I would continue meds   Could back down a little on metorpolol (as HR is slow) follow BP 3  HL Lipids were good in March  LDL 67  HDL 46    4  Thryoid Last TSH in March was normal  Keep on same meds   I would follow up inSeptmeber        Current medicines are reviewed at length with the patient today.  The patient does not have concerns regarding medicines.  Signed, Dorris Carnes, MD  06/25/2017 2:20 PM    [address], [place], [place]  [address] Phone: [phone]; Fax: [phone]

## 2017-06-25 NOTE — Patient Instructions (Signed)
Your physician has recommended you make the following change in your medication:  1.) decrease metoprolol succinate to 25 mg once a day  Your physician recommends that you schedule a follow-up appointment in: September, 2019 with Dr. Harrington Challenger.

## 2017-08-27 DIAGNOSIS — H2513 Age-related nuclear cataract, bilateral: Secondary | ICD-10-CM | POA: Diagnosis not present

## 2017-08-27 DIAGNOSIS — E113292 Type 2 diabetes mellitus with mild nonproliferative diabetic retinopathy without macular edema, left eye: Secondary | ICD-10-CM | POA: Diagnosis not present

## 2017-08-31 ENCOUNTER — Emergency Department (HOSPITAL_COMMUNITY)
Admission: EM | Admit: 2017-08-31 | Discharge: 2017-08-31 | Disposition: A | Discharge status: Home / Self Care | Payer: Medicare HMO | Attending: Emergency Medicine | Admitting: Emergency Medicine

## 2017-08-31 ENCOUNTER — Emergency Department (HOSPITAL_COMMUNITY): Payer: Medicare HMO

## 2017-08-31 ENCOUNTER — Encounter (HOSPITAL_COMMUNITY): Payer: Self-pay | Admitting: *Deleted

## 2017-08-31 DIAGNOSIS — K5792 Diverticulitis of intestine, part unspecified, without perforation or abscess without bleeding: Secondary | ICD-10-CM | POA: Insufficient documentation

## 2017-08-31 DIAGNOSIS — I10 Essential (primary) hypertension: Secondary | ICD-10-CM | POA: Diagnosis not present

## 2017-08-31 DIAGNOSIS — R3121 Asymptomatic microscopic hematuria: Secondary | ICD-10-CM

## 2017-08-31 DIAGNOSIS — Z951 Presence of aortocoronary bypass graft: Secondary | ICD-10-CM | POA: Insufficient documentation

## 2017-08-31 DIAGNOSIS — Z7982 Long term (current) use of aspirin: Secondary | ICD-10-CM | POA: Insufficient documentation

## 2017-08-31 DIAGNOSIS — E1165 Type 2 diabetes mellitus with hyperglycemia: Secondary | ICD-10-CM

## 2017-08-31 DIAGNOSIS — R1032 Left lower quadrant pain: Secondary | ICD-10-CM | POA: Diagnosis not present

## 2017-08-31 DIAGNOSIS — E119 Type 2 diabetes mellitus without complications: Secondary | ICD-10-CM | POA: Diagnosis not present

## 2017-08-31 DIAGNOSIS — Z7984 Long term (current) use of oral hypoglycemic drugs: Secondary | ICD-10-CM | POA: Insufficient documentation

## 2017-08-31 DIAGNOSIS — K5732 Diverticulitis of large intestine without perforation or abscess without bleeding: Secondary | ICD-10-CM | POA: Diagnosis not present

## 2017-08-31 DIAGNOSIS — Z79899 Other long term (current) drug therapy: Secondary | ICD-10-CM | POA: Insufficient documentation

## 2017-08-31 DIAGNOSIS — I252 Old myocardial infarction: Secondary | ICD-10-CM | POA: Insufficient documentation

## 2017-08-31 DIAGNOSIS — I2510 Atherosclerotic heart disease of native coronary artery without angina pectoris: Secondary | ICD-10-CM | POA: Insufficient documentation

## 2017-08-31 DIAGNOSIS — R109 Unspecified abdominal pain: Secondary | ICD-10-CM | POA: Diagnosis not present

## 2017-08-31 LAB — URINALYSIS, ROUTINE W REFLEX MICROSCOPIC
BACTERIA UA: NONE SEEN
Bilirubin Urine: NEGATIVE
GLUCOSE, UA: NEGATIVE mg/dL
Ketones, ur: NEGATIVE mg/dL
LEUKOCYTES UA: NEGATIVE
Nitrite: NEGATIVE
PROTEIN: NEGATIVE mg/dL
Specific Gravity, Urine: 1.004 — ABNORMAL LOW (ref 1.005–1.030)
pH: 5 (ref 5.0–8.0)

## 2017-08-31 LAB — COMPREHENSIVE METABOLIC PANEL
ALBUMIN: 4.4 g/dL (ref 3.5–5.0)
ALT: 19 U/L (ref 0–44)
ANION GAP: 9 (ref 5–15)
AST: 20 U/L (ref 15–41)
Alkaline Phosphatase: 54 U/L (ref 38–126)
BUN: 17 mg/dL (ref 8–23)
CHLORIDE: 101 mmol/L (ref 98–111)
CO2: 29 mmol/L (ref 22–32)
Calcium: 9.7 mg/dL (ref 8.9–10.3)
Creatinine, Ser: 1.49 mg/dL — ABNORMAL HIGH (ref 0.61–1.24)
GFR calc Af Amer: 54 mL/min — ABNORMAL LOW (ref >60)
GFR, EST NON AFRICAN AMERICAN: 47 mL/min — AB (ref >60)
GLUCOSE: 233 mg/dL — AB (ref 70–99)
POTASSIUM: 4 mmol/L (ref 3.5–5.1)
Sodium: 139 mmol/L (ref 135–145)
Total Bilirubin: 0.9 mg/dL (ref 0.3–1.2)
Total Protein: 7.9 g/dL (ref 6.5–8.1)

## 2017-08-31 LAB — CBC
HEMATOCRIT: 41.7 % (ref 39.0–52.0)
Hemoglobin: 13.5 g/dL (ref 13.0–17.0)
MCH: 27.6 pg (ref 26.0–34.0)
MCHC: 32.4 g/dL (ref 30.0–36.0)
MCV: 85.1 fL (ref 78.0–100.0)
Platelets: 182 10*3/uL (ref 150–400)
RBC: 4.9 MIL/uL (ref 4.22–5.81)
RDW: 12.6 % (ref 11.5–15.5)
WBC: 7.6 10*3/uL (ref 4.0–10.5)

## 2017-08-31 LAB — LIPASE, BLOOD: LIPASE: 50 U/L (ref 11–51)

## 2017-08-31 MED ORDER — CIPROFLOXACIN HCL 500 MG PO TABS: 500.0000 mg | ORAL_TABLET | Freq: Two times a day (BID) | ORAL | 0 refills | Status: DC

## 2017-08-31 MED ORDER — CIPROFLOXACIN HCL 500 MG PO TABS
500.0000 mg | ORAL_TABLET | Freq: Once | ORAL | Status: AC
  Administered 2017-08-31: 500 mg via ORAL
  Filled 2017-08-31: qty 1

## 2017-08-31 MED ORDER — ONDANSETRON 4 MG PO TBDP: 4.0000 mg | ORAL_TABLET | Freq: Three times a day (TID) | ORAL | 0 refills | Status: DC | PRN

## 2017-08-31 MED ORDER — ONDANSETRON HCL 4 MG/2ML IJ SOLN
4.0000 mg | Freq: Once | INTRAMUSCULAR | Status: AC | PRN
  Administered 2017-08-31: 4 mg via INTRAVENOUS
  Filled 2017-08-31 (×2): qty 2

## 2017-08-31 MED ORDER — METRONIDAZOLE 500 MG PO TABS: 500.0000 mg | ORAL_TABLET | Freq: Three times a day (TID) | ORAL | 0 refills | Status: AC

## 2017-08-31 MED ORDER — METRONIDAZOLE 500 MG PO TABS
500.0000 mg | ORAL_TABLET | Freq: Once | ORAL | Status: AC
  Administered 2017-08-31: 500 mg via ORAL
  Filled 2017-08-31: qty 1

## 2017-08-31 MED ORDER — IOHEXOL 300 MG/ML  SOLN
100.0000 mL | Freq: Once | INTRAMUSCULAR | Status: AC | PRN
  Administered 2017-08-31: 100 mL via INTRAVENOUS

## 2017-08-31 MED ORDER — SODIUM CHLORIDE 0.9 % IV BOLUS
1000.0000 mL | Freq: Once | INTRAVENOUS | Status: AC
  Administered 2017-08-31: 1000 mL via INTRAVENOUS

## 2017-08-31 MED ORDER — NAPROXEN 500 MG PO TABS: 500.0000 mg | ORAL_TABLET | Freq: Two times a day (BID) | ORAL | 0 refills | Status: DC | PRN

## 2017-08-31 MED ORDER — MORPHINE SULFATE (PF) 4 MG/ML IV SOLN
4.0000 mg | Freq: Once | INTRAVENOUS | Status: AC
  Administered 2017-08-31: 4 mg via INTRAVENOUS
  Filled 2017-08-31: qty 1

## 2017-08-31 MED ORDER — HYDROCODONE-ACETAMINOPHEN 5-325 MG PO TABS: 1.0000 | ORAL_TABLET | Freq: Four times a day (QID) | ORAL | 0 refills | Status: DC | PRN

## 2017-08-31 NOTE — ED Triage Notes (Signed)
Pt in c/o LLQ abdominal pain for the last two days, denies n/v, decreased appetite, history of diverticulitis years ago

## 2017-08-31 NOTE — ED Notes (Signed)
Patient transported to CT 

## 2017-08-31 NOTE — ED Provider Notes (Signed)
[facility] EMERGENCY DEPARTMENT Provider Note   CSN: [id number] Arrival date & time: 08/31/17  1054     History   Chief Complaint Chief Complaint  Patient presents with  . Abdominal Pain    HPI Gregory Friedman is a 67 y.o. male with a PMHx of BPH, CAD, diverticulosis, GERD, HLD, HTN, DM2, and other conditions listed below, who presents to the ED with complaints of pain with 2 days of LLQ pain that got worse today.  He describes the pain as 8/10 at worst but 5/10 currently, constant sharp LLQ pain that radiates across the lower abdomen, worse with movement, and somewhat improved with ibuprofen.  He reports associated loss of appetite which is abnormal for him.  He states that 2 days ago with his last bowel movement however he had about 3-4 bowel movements which were normal just more frequent than usual, denies any diarrhea.  He has not been eating much lately so he did not expect to be having any bowel movements recently, does not feel constipated.  Last week he had a dental extraction so he was taking hydrocodone for a few days and then NSAIDs for a few days but he does not typically take NSAIDs very frequently.  He just had a colonoscopy by Dr. Carlean Purl at [facility] in May 2019 which showed diverticulosis and a prostate nodule for which he followed up with a urologist already, reportedly the urologist told him everything was fine.  His PCP is Dr. Criss Rosales.  He denies fevers, chills, CP, SOB, nausea/vomiting, diarrhea/constipation, obstipation, melena, hematochezia, hematuria, dysuria, myalgias, arthralgias, numbness, tingling, focal weakness, or any other complaints at this time. Denies recent travel, sick contacts, suspicious food intake, EtOH use, or prior abd surgeries.   The history is provided by the patient and medical records. No language interpreter was used.  Abdominal Pain   Pertinent negatives include fever, diarrhea, nausea, vomiting, constipation, dysuria,  hematuria, arthralgias and myalgias.    Past Medical History:  Diagnosis Date  . Anxiety   . BPH (benign prostatic hyperplasia)   . Cancer ([place]) 2014   neck area  . Colon polyps   . Coronary artery disease   . Depression   . Diverticulosis   . GERD (gastroesophageal reflux disease)   . History of gout    toe  . 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 Hyperlipidemia   . Hypertension    states under control with med., has been on med. since 2003  . 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 Myocardial infarction ([place]) 2003  . Non-insulin dependent type 2 diabetes mellitus ([place])   . PONV (postoperative nausea and vomiting)   . S/P radiation therapy    Received 8 fractions, then stopped due to mucositis with Radiation/ Cisplatin  . Status post dilation of esophageal narrowing   . Trigger thumb of right hand 01/2016    Patient Active Problem List   Diagnosis Date Noted  . History of colonic polyps 04/04/2016  . Dysphagia 04/04/2016  . Bloating 04/04/2016  . Diarrhea 04/04/2016  . Chest pain 07/15/2015  . Diabetes mellitus, without long-term current use of insulin ([place]) 07/15/2015  . Pain in the chest   . HLD (hyperlipidemia)   . S/P CABG (coronary artery bypass graft)   . Noncompliance with medications 07/08/2015  . Acute gout 04/19/2015  . Type 2 diabetes mellitus not  at [facility]) 05/21/2014  . GERD (gastroesophageal reflux disease) 05/14/2014  . Depression, major, in remission ([place]) 03/27/2013  . Allergic rhinitis, cause unspecified 03/27/2013  . 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 Depressive disorder, not elsewhere classified 12/06/2011  . Type II diabetes mellitus, uncontrolled ([place]) 05/24/2011  . Nonspecific (abnormal) findings on radiological and other examination of gastrointestinal tract 02/10/2011  . Dysphagia, unspecified(787.20) 02/10/2011  . Palpitations  02/02/2011  . Type II or unspecified type diabetes mellitus without mention of complication, not stated as uncontrolled 11/25/2010  . Dizziness 07/15/2010  . DIAB W/OPHTH MANIFESTS TYPE I [JUV TYPE] UNCNTRL 08/02/2009  . Mixed hyperlipidemia 08/02/2009  . HYPERTENSION, BENIGN 08/02/2009  . Coronary atherosclerosis 08/02/2009    Past Surgical History:  Procedure Laterality Date  . CARDIAC CATHETERIZATION  05/25/2010  . CARDIOVASCULAR STRESS TEST  07/2009  . COLONOSCOPY WITH PROPOFOL  02/16/2011  . CORONARY ANGIOPLASTY  2003; 2006   2 stents in heart  . CORONARY ARTERY BYPASS GRAFT  2005  . CYSTOSCOPY  02/09/2011   Procedure: CYSTOSCOPY;  Surgeon: Marissa Nestle, MD;  Location: [facility];  Service: Urology;  Laterality: N/A;  . ESOPHAGOGASTRODUODENOSCOPY (EGD) WITH ESOPHAGEAL DILATION  02/16/2011   with Propofol  . FOOT ARTHRODESIS, SUBTALAR Right 10/13/2009  . MASS EXCISION N/A 03/13/2012   Procedure: EXCISION LEFT BUCCAL MUCOSA;  Surgeon: Rozetta Nunnery, MD;  Location: [facility];  Service: ENT;  Laterality: N/A;  . RADICAL NECK DISSECTION Left 03/13/2012   Procedure: RADICAL NECK DISSECTION;  Surgeon: Rozetta Nunnery, MD;  Location: [facility];  Service: ENT;  Laterality: Left;  . TOOTH EXTRACTION N/A 03/13/2012   Procedure: EXTRACTION MOLARS;  Surgeon: Rozetta Nunnery, MD;  Location: [facility];  Service: ENT;  Laterality: N/A;  . TRIGGER FINGER RELEASE Right 02/24/2016   Procedure: RELEASE TRIGGER FINGER/A-1 PULLEY RIGHT THUMB;  Surgeon: Daryll Brod, MD;  Location: [facility];  Service: Orthopedics;  Laterality: Right;  FAB        Home Medications    Prior to Admission medications   Medication Sig Start Date End Date Taking? Authorizing Provider  amLODipine (NORVASC) 2.5 MG tablet Take 1 tablet (2.5 mg total) by mouth daily. 03/29/17   Fay Records, MD  antiseptic oral rinse Charlene Brooke) LIQD  05/16/17   [provider]  aspirin 81 MG tablet Take 81 mg by  mouth daily.    [provider]  enalapril (VASOTEC) 10 MG tablet Take 1 tablet (10 mg total) by mouth daily. 09/06/15   Rita Ohara, MD  glucose blood test strip Use as instructed 01/20/13   Rita Ohara, MD  Lancets MISC Please test blood sugars daily 01/20/13   Rita Ohara, MD  levothyroxine (SYNTHROID, LEVOTHROID) 25 MCG tablet Take 25 mcg by mouth daily before breakfast.    [provider]  metFORMIN (GLUCOPHAGE-XR) 500 MG 24 hr tablet TAKE 2 TABLETS BY MOUTH  DAILY WITH BREAKFAST Patient taking differently: TAKE 2 TABLETS BY MOUTH  Twice a day 12/03/15   Rita Ohara, MD  metoprolol succinate (TOPROL XL) 25 MG 24 hr tablet Take 1 tablet (25 mg total) by mouth daily. 06/25/17   Fay Records, MD  Multiple Vitamins-Minerals (MULTIVITAMIN WITH MINERALS) tablet Take 1 tablet by mouth 2 (two) times daily.     [provider]  nitroGLYCERIN (NITROSTAT) 0.4 MG SL tablet Place 1 tablet (0.4 mg total) under the tongue every 5 (five) minutes as needed. Chest pain 06/16/16  Fay Records, MD  omeprazole (PRILOSEC) 40 MG capsule TAKE 1 CAPSULE BY MOUTH  DAILY 12/03/15   Rita Ohara, MD  ranitidine (ZANTAC) 150 MG tablet Take 1 tablet (150 mg total) by mouth daily. 03/31/16   Zehr, Janett Billow D, PA-C  Saw Palmetto, Serenoa repens, 320 MG CAPS Take 1 capsule by mouth 2 (two) times daily.     [provider]  simvastatin (ZOCOR) 40 MG tablet TAKE 1 TABLET BY MOUTH AT  BEDTIME 12/03/15   Rita Ohara, MD    Family History Family History  Problem Relation Age of Onset  . Stroke Sister   . Diabetes Brother   . Seizures Mother   . Asthma Father   . Thyroid cancer Daughter   . Diabetes Sister   . Parkinson's disease Brother   . Heart disease Brother   . Diabetes Brother   . Colon cancer Neg Hx     Social History Social History   Tobacco Use  . Smoking status: Never Smoker  . Smokeless tobacco: Never Used  Substance Use Topics  . Alcohol use: No  . Drug use: No      Allergies   Patient has no known allergies.   Review of Systems Review of Systems  Constitutional: Positive for appetite change. Negative for chills and fever.  Respiratory: Negative for shortness of breath.   Cardiovascular: Negative for chest pain.  Gastrointestinal: Positive for abdominal pain. Negative for blood in stool, constipation, diarrhea, nausea and vomiting.       +change in BM frequency  Genitourinary: Negative for dysuria and hematuria.  Musculoskeletal: Negative for arthralgias and myalgias.  Skin: Negative for color change.  Allergic/Immunologic: Positive for immunocompromised state (DM2).  Neurological: Negative for weakness and numbness.  Psychiatric/Behavioral: Negative for confusion.   All other systems reviewed and are negative for acute change except as noted in the HPI.    Physical Exam Updated Vital Signs BP (!) 150/82 (BP Location: Right Arm)   Pulse 60   Temp 98 F (36.7 C) (Oral)   Resp 16   SpO2 98%   Physical Exam  Constitutional: He is oriented to person, place, and time. Vital signs are normal. He appears well-developed and well-nourished.  Non-toxic appearance. No distress.  Afebrile, nontoxic, NAD  HENT:  Head: Normocephalic and atraumatic.  Mouth/Throat: Oropharynx is clear and moist and mucous membranes are normal.  Eyes: Conjunctivae and EOM are normal. Right eye exhibits no discharge. Left eye exhibits no discharge.  Neck: Normal range of motion. Neck supple.  Cardiovascular: Normal rate, regular rhythm, normal heart sounds and intact distal pulses. Exam reveals no gallop and no friction rub.  No murmur heard. Pulmonary/Chest: Effort normal and breath sounds normal. No respiratory distress. He has no decreased breath sounds. He has no wheezes. He has no rhonchi. He has no rales.  Abdominal: Soft. Normal appearance and bowel sounds are normal. He exhibits no distension. There is generalized tenderness and tenderness in the left lower  quadrant. There is guarding. There is no rigidity, no rebound, no CVA tenderness, no tenderness at McBurney's point and negative Murphy's sign.  Soft, nondistended, +BS throughout, with mild generalized abd TTP but moderate LLQ TTP with voluntary guarding in the LLQ, no rebound/rigidity, neg murphy's, neg mcburney's, no CVA TTP   Musculoskeletal: Normal range of motion.  Neurological: He is alert and oriented to person, place, and time. He has normal strength. No sensory deficit.  Skin: Skin is warm, dry and intact. No rash noted.  Psychiatric: He has a normal mood and affect.  Nursing note and vitals reviewed.    ED Treatments / Results  Labs (all labs ordered are listed, but only abnormal results are displayed) Labs Reviewed  COMPREHENSIVE METABOLIC PANEL - Abnormal; Notable for the following components:      Result Value   Glucose, Bld 233 (*)    Creatinine, Ser 1.49 (*)    GFR calc non Af Amer 47 (*)    GFR calc Af Amer 54 (*)    All other components within normal limits  URINALYSIS, ROUTINE W REFLEX MICROSCOPIC - Abnormal; Notable for the following components:   Color, Urine STRAW (*)    Specific Gravity, Urine 1.004 (*)    Hgb urine dipstick SMALL (*)    All other components within normal limits  LIPASE, BLOOD  CBC    EKG None  Radiology Ct Abdomen Pelvis W Contrast  Result Date: 08/31/2017 CLINICAL DATA:  Diffuse abdominal pain. Evaluate for diverticulitis. History of head neck cancer. History of prior gastrostomy tube. EXAM: CT ABDOMEN AND PELVIS WITH CONTRAST TECHNIQUE: Multidetector CT imaging of the abdomen and pelvis was performed using the standard protocol following bolus administration of intravenous contrast. CONTRAST:  147mL OMNIPAQUE IOHEXOL 300 MG/ML  SOLN COMPARISON:  CT abdomen pelvis-01/20/2011; PET-CT-03/08/2012 FINDINGS: Lower chest: Limited visualization of lower thorax demonstrates minimal dependent subpleural ground-glass atelectasis. No discrete focal  airspace opacities. No pleural effusion. Borderline cardiomegaly. Coronary artery calcifications. Post median sternotomy. No pericardial effusion. Hepatobiliary: Normal hepatic contour. No discrete hepatic lesions. Layering opacities within the gallbladder favored to represent gallstones. No associated gallbladder wall thickening or pericholecystic fluid. No intra or extrahepatic biliary ductal dilatation. No ascites. Pancreas: Normal appearance of the pancreas. Spleen: Normal appearance of the spleen Adrenals/Urinary Tract: There is symmetric enhancement and excretion of the bilateral kidneys. No definite renal stones on this postcontrast examination. No discrete renal lesions. No urine obstruction or perinephric stranding. Normal appearance of the bilateral adrenal glands. Normal appearance of the urinary bladder given degree of distention. Stomach/Bowel: There is ill-defined stranding centered about a prominent diverticuli arising from the posterolateral aspect of the mid descending colon with associated minimal amount of adjacent mesenteric stranding (axial image 38, series 3, coronal image 79, series 6), findings compatible with acute uncomplicated diverticulitis. No evidence of perforation or definable/drainable fluid collection. Moderate colonic stool burden without evidence of enteric obstruction. Normal appearance of the terminal ileum and appendix. No pneumoperitoneum, pneumatosis or portal venous gas. Tubular structure arising from the anterior inferior aspect of the greater curvature of the stomach likely represents scar tissue from patient's prior gastrostomy tube (representative images 28 and 29, series 3). Vascular/Lymphatic: Atherosclerotic plaque with a normal caliber abdominal aorta. The major branch vessels of the abdominal aorta appear widely patent on this non CTA examination. No bulky retroperitoneal, mesenteric, pelvic or inguinal lymphadenopathy. Reproductive: Enlarged prostate with mass  effect on the undersurface of the urinary bladder. Dystrophic calcifications within the prostate gland. No free fluid the pelvic cul-de-sac. Other: Small bilateral mesenteric fat containing inguinal hernias, left greater than right. Musculoskeletal: No acute or aggressive osseous abnormalities. Stigmata of DISH within the lower thoracic spine. Mild DDD L2-L3 with disc space height loss, endplate irregularity and anteriorly directed disc osteophyte complex at this location. IMPRESSION: 1. Acute uncomplicated diverticulitis involving the mid descending colon. No evidence of perforation or definable/drainable fluid collection. 2. Suspected cholelithiasis without evidence of cholecystitis. 3.  Aortic Atherosclerosis (ICD10-I70.0). 4. Prostatomegaly with mass effect on the undersurface of  the urinary bladder. If not previously performed, further evaluation with DRE could be performed as indicated. Electronically Signed   By: Sandi Mariscal M.D.   On: 08/31/2017 15:27    Colonoscopy 05/30/17: -A prostate nodule found on digital rectal exam. Tiny to right of median fissure in proximal right lobe - Diverticulosis in the sigmoid colon. - The examination was otherwise normal on direct and retroflexion views. - No specimens collected.   Procedures Procedures (including critical care time)  Medications Ordered in ED Medications  sodium chloride 0.9 % bolus 1,000 mL (1,000 mLs Intravenous New Bag/Given 08/31/17 1255)  morphine 4 MG/ML injection 4 mg (4 mg Intravenous Given 08/31/17 1402)  ondansetron (ZOFRAN) injection 4 mg (4 mg Intravenous Given 08/31/17 1402)  iohexol (OMNIPAQUE) 300 MG/ML solution 100 mL (100 mLs Intravenous Contrast Given 08/31/17 1510)  ciprofloxacin (CIPRO) tablet 500 mg (500 mg Oral Given 08/31/17 1549)  metroNIDAZOLE (FLAGYL) tablet 500 mg (500 mg Oral Given 08/31/17 1548)     Initial Impression / Assessment and Plan / ED Course  I have reviewed the triage vital signs and the nursing  notes.  Pertinent labs & imaging results that were available during my care of the patient were reviewed by me and considered in my medical decision making (see chart for details).     67 y.o. male here with lower abd pain x2 days with associated decreased appetite and change in BMs. On exam, mild generalized abd TTP but moderate LLQ TTP, some voluntary guarding in LLQ but otherwise nonperitoneal. Work up thus far reveals: CBC WNL. Other labs pending. DDx includes diverticulitis, perf, obstruction, etc. Will get CT and await labs. Will give pain meds and fluids, zofran ordered in case morphine makes him feel nauseated. Will reassess shortly. Discussed case with my attending Dr. Tyrone Nine who agrees with plan.   4:41 PM CMP with gluc 233, Cr 1.49 (marginally higher than prior); lipase WNL. U/A with small Hgb but otherwise no evidence of UTI or other concerning findings; will advised pt to f/up with PCP/urologist regarding hematuria to ensure it resolves, or for further outpatient evaluation if it persists. CT abd/pelv shows acute uncomplicated diverticulitis involving mid descending colon without evidence of perforation or fluid collection; cholelithiasis without cholecystitis also seen; prostatomegaly with mass effect on undersurface of urinary bladder (pt recently evaluated by urology, doubt need for further work up of this today). Pt feeling better and tolerating PO well here. Given first doses of abx here. Will d/c home with abx for diverticulitis, zofran, pain meds, and advised f/up with his PCP/GI in 1wk for recheck of symptoms, as well as his urologist to discuss his hematuria and prostatomegaly. Discussed high fiber diet as well. I explained the diagnosis and have given explicit precautions to return to the ER including for any other new or worsening symptoms. The patient understands and accepts the medical plan as it's been dictated and I have answered their questions. Discharge instructions concerning  home care and prescriptions have been given. The patient is STABLE and is discharged to home in good condition.   [place] reviewed prior to dispensing controlled substance medications, and 2 year search was notable for: vicodin 5-325mg  #20 tabs on 02/25/16, otherwise no other narcotics/controlled substances found. Risks/benefits/alternatives and expectations discussed regarding controlled substances. Side effects of medications discussed. Informed consent obtained.   Final Clinical Impressions(s) / ED Diagnoses   Final diagnoses:  Acute left lower quadrant pain  Acute diverticulitis  Type 2 diabetes mellitus with hyperglycemia, without  long-term current use of insulin ([place])  Asymptomatic microscopic hematuria    ED Discharge Orders         Ordered    ciprofloxacin (CIPRO) 500 MG tablet  2 times daily     08/31/17 1551    metroNIDAZOLE (FLAGYL) 500 MG tablet  3 times daily     08/31/17 1551    ondansetron (ZOFRAN ODT) 4 MG disintegrating tablet  Every 8 hours PRN     08/31/17 1551    naproxen (NAPROSYN) 500 MG tablet  2 times daily PRN     08/31/17 1551    HYDROcodone-acetaminophen (NORCO) 5-325 MG tablet  Every 6 hours PRN     08/31/17 [address] Elanor Cale, [place], PA-C 08/31/17 [place], [place], DO 08/31/17 1650

## 2017-08-31 NOTE — ED Notes (Signed)
Pt agrees to have medication for pain.

## 2017-08-31 NOTE — Discharge Instructions (Addendum)
You have diverticulitis. Start taking the antibiotics as directed until completed, with the next doses for BOTH being tonight with dinner. Stay well hydrated and get plenty of rest. Increase the fiber in your diet. Alternate between naprosyn and norco as directed, as needed for pain but do not drive or operate machinery with narcotic pain medication use. You may need an over-the-counter stool softener such as colace or miralax to use with this pain medication use, if any constipation occurs. Use zofran as directed as needed for nausea. Follow up with your regular doctor or your GI doctor in 1 week for recheck of symptoms. You have some blood in your urine and an enlarged prostate, follow up with your urologist in 1-2 weeks to discuss these findings and for ongoing evaluation/management of these findings. Return to the ER for emergent changes or worsening symptoms.

## 2017-09-17 DIAGNOSIS — K5710 Diverticulosis of small intestine without perforation or abscess without bleeding: Secondary | ICD-10-CM | POA: Diagnosis not present

## 2017-09-17 DIAGNOSIS — E782 Mixed hyperlipidemia: Secondary | ICD-10-CM | POA: Diagnosis not present

## 2017-09-17 DIAGNOSIS — E1100 Type 2 diabetes mellitus with hyperosmolarity without nonketotic hyperglycemic-hyperosmolar coma (NKHHC): Secondary | ICD-10-CM | POA: Diagnosis not present

## 2017-09-17 DIAGNOSIS — Z6829 Body mass index (BMI) 29.0-29.9, adult: Secondary | ICD-10-CM | POA: Diagnosis not present

## 2017-09-17 DIAGNOSIS — I25700 Atherosclerosis of coronary artery bypass graft(s), unspecified, with unstable angina pectoris: Secondary | ICD-10-CM | POA: Diagnosis not present

## 2017-09-17 DIAGNOSIS — I259 Chronic ischemic heart disease, unspecified: Secondary | ICD-10-CM | POA: Diagnosis not present

## 2017-09-20 ENCOUNTER — Encounter (HOSPITAL_COMMUNITY): Payer: Self-pay | Admitting: Emergency Medicine

## 2017-09-20 ENCOUNTER — Emergency Department (HOSPITAL_COMMUNITY): Payer: Medicare HMO

## 2017-09-20 ENCOUNTER — Observation Stay (HOSPITAL_COMMUNITY)
Admission: EM | Admit: 2017-09-20 | Discharge: 2017-09-21 | Disposition: A | Discharge status: Home / Self Care | Payer: Medicare HMO | Attending: Internal Medicine | Admitting: Internal Medicine

## 2017-09-20 ENCOUNTER — Other Ambulatory Visit: Payer: Self-pay

## 2017-09-20 DIAGNOSIS — Z808 Family history of malignant neoplasm of other organs or systems: Secondary | ICD-10-CM | POA: Insufficient documentation

## 2017-09-20 DIAGNOSIS — Z7984 Long term (current) use of oral hypoglycemic drugs: Secondary | ICD-10-CM | POA: Insufficient documentation

## 2017-09-20 DIAGNOSIS — Z9221 Personal history of antineoplastic chemotherapy: Secondary | ICD-10-CM | POA: Insufficient documentation

## 2017-09-20 DIAGNOSIS — E119 Type 2 diabetes mellitus without complications: Secondary | ICD-10-CM | POA: Diagnosis not present

## 2017-09-20 DIAGNOSIS — I471 Supraventricular tachycardia: Secondary | ICD-10-CM | POA: Diagnosis not present

## 2017-09-20 DIAGNOSIS — R109 Unspecified abdominal pain: Secondary | ICD-10-CM | POA: Diagnosis present

## 2017-09-20 DIAGNOSIS — Z86010 Personal history of colonic polyps: Secondary | ICD-10-CM | POA: Insufficient documentation

## 2017-09-20 DIAGNOSIS — R112 Nausea with vomiting, unspecified: Secondary | ICD-10-CM | POA: Diagnosis not present

## 2017-09-20 DIAGNOSIS — K219 Gastro-esophageal reflux disease without esophagitis: Secondary | ICD-10-CM | POA: Diagnosis not present

## 2017-09-20 DIAGNOSIS — K8020 Calculus of gallbladder without cholecystitis without obstruction: Secondary | ICD-10-CM | POA: Diagnosis not present

## 2017-09-20 DIAGNOSIS — Z820 Family history of epilepsy and other diseases of the nervous system: Secondary | ICD-10-CM | POA: Insufficient documentation

## 2017-09-20 DIAGNOSIS — R079 Chest pain, unspecified: Secondary | ICD-10-CM | POA: Diagnosis not present

## 2017-09-20 DIAGNOSIS — I700 Atherosclerosis of aorta: Secondary | ICD-10-CM | POA: Insufficient documentation

## 2017-09-20 DIAGNOSIS — R0789 Other chest pain: Secondary | ICD-10-CM | POA: Insufficient documentation

## 2017-09-20 DIAGNOSIS — N400 Enlarged prostate without lower urinary tract symptoms: Secondary | ICD-10-CM | POA: Insufficient documentation

## 2017-09-20 DIAGNOSIS — I252 Old myocardial infarction: Secondary | ICD-10-CM | POA: Insufficient documentation

## 2017-09-20 DIAGNOSIS — K4020 Bilateral inguinal hernia, without obstruction or gangrene, not specified as recurrent: Secondary | ICD-10-CM | POA: Insufficient documentation

## 2017-09-20 DIAGNOSIS — Z85819 Personal history of malignant neoplasm of unspecified site of lip, oral cavity, and pharynx: Secondary | ICD-10-CM | POA: Insufficient documentation

## 2017-09-20 DIAGNOSIS — I2510 Atherosclerotic heart disease of native coronary artery without angina pectoris: Secondary | ICD-10-CM | POA: Diagnosis present

## 2017-09-20 DIAGNOSIS — M109 Gout, unspecified: Secondary | ICD-10-CM | POA: Insufficient documentation

## 2017-09-20 DIAGNOSIS — Z825 Family history of asthma and other chronic lower respiratory diseases: Secondary | ICD-10-CM | POA: Insufficient documentation

## 2017-09-20 DIAGNOSIS — Z7982 Long term (current) use of aspirin: Secondary | ICD-10-CM | POA: Insufficient documentation

## 2017-09-20 DIAGNOSIS — E782 Mixed hyperlipidemia: Secondary | ICD-10-CM | POA: Insufficient documentation

## 2017-09-20 DIAGNOSIS — F329 Major depressive disorder, single episode, unspecified: Secondary | ICD-10-CM | POA: Diagnosis not present

## 2017-09-20 DIAGNOSIS — Z9114 Patient's other noncompliance with medication regimen: Secondary | ICD-10-CM | POA: Insufficient documentation

## 2017-09-20 DIAGNOSIS — Z8249 Family history of ischemic heart disease and other diseases of the circulatory system: Secondary | ICD-10-CM | POA: Insufficient documentation

## 2017-09-20 DIAGNOSIS — Z8589 Personal history of malignant neoplasm of other organs and systems: Secondary | ICD-10-CM

## 2017-09-20 DIAGNOSIS — K760 Fatty (change of) liver, not elsewhere classified: Secondary | ICD-10-CM | POA: Insufficient documentation

## 2017-09-20 DIAGNOSIS — I10 Essential (primary) hypertension: Secondary | ICD-10-CM | POA: Diagnosis not present

## 2017-09-20 DIAGNOSIS — Z951 Presence of aortocoronary bypass graft: Secondary | ICD-10-CM

## 2017-09-20 DIAGNOSIS — Z79899 Other long term (current) drug therapy: Secondary | ICD-10-CM | POA: Insufficient documentation

## 2017-09-20 DIAGNOSIS — N182 Chronic kidney disease, stage 2 (mild): Secondary | ICD-10-CM | POA: Diagnosis not present

## 2017-09-20 DIAGNOSIS — R1084 Generalized abdominal pain: Secondary | ICD-10-CM | POA: Diagnosis not present

## 2017-09-20 DIAGNOSIS — K5730 Diverticulosis of large intestine without perforation or abscess without bleeding: Secondary | ICD-10-CM | POA: Diagnosis not present

## 2017-09-20 DIAGNOSIS — Z823 Family history of stroke: Secondary | ICD-10-CM | POA: Insufficient documentation

## 2017-09-20 DIAGNOSIS — F419 Anxiety disorder, unspecified: Secondary | ICD-10-CM | POA: Insufficient documentation

## 2017-09-20 DIAGNOSIS — I129 Hypertensive chronic kidney disease with stage 1 through stage 4 chronic kidney disease, or unspecified chronic kidney disease: Secondary | ICD-10-CM | POA: Diagnosis not present

## 2017-09-20 DIAGNOSIS — Z923 Personal history of irradiation: Secondary | ICD-10-CM | POA: Diagnosis not present

## 2017-09-20 DIAGNOSIS — Z833 Family history of diabetes mellitus: Secondary | ICD-10-CM | POA: Insufficient documentation

## 2017-09-20 DIAGNOSIS — E1122 Type 2 diabetes mellitus with diabetic chronic kidney disease: Secondary | ICD-10-CM | POA: Insufficient documentation

## 2017-09-20 DIAGNOSIS — R42 Dizziness and giddiness: Secondary | ICD-10-CM

## 2017-09-20 DIAGNOSIS — Z955 Presence of coronary angioplasty implant and graft: Secondary | ICD-10-CM | POA: Insufficient documentation

## 2017-09-20 LAB — CBC
HCT: 41.6 % (ref 39.0–52.0)
HEMOGLOBIN: 13.3 g/dL (ref 13.0–17.0)
MCH: 27.5 pg (ref 26.0–34.0)
MCHC: 32 g/dL (ref 30.0–36.0)
MCV: 86 fL (ref 78.0–100.0)
PLATELETS: 189 10*3/uL (ref 150–400)
RBC: 4.84 MIL/uL (ref 4.22–5.81)
RDW: 12.5 % (ref 11.5–15.5)
WBC: 4.3 10*3/uL (ref 4.0–10.5)

## 2017-09-20 LAB — I-STAT TROPONIN, ED: TROPONIN I, POC: 0.01 ng/mL (ref 0.00–0.08)

## 2017-09-20 LAB — URINALYSIS, ROUTINE W REFLEX MICROSCOPIC
Bilirubin Urine: NEGATIVE
Glucose, UA: NEGATIVE mg/dL
HGB URINE DIPSTICK: NEGATIVE
Ketones, ur: NEGATIVE mg/dL
LEUKOCYTES UA: NEGATIVE
Nitrite: NEGATIVE
PROTEIN: NEGATIVE mg/dL
SPECIFIC GRAVITY, URINE: 1.012 (ref 1.005–1.030)
pH: 6 (ref 5.0–8.0)

## 2017-09-20 LAB — COMPREHENSIVE METABOLIC PANEL
ALT: 24 U/L (ref 0–44)
ANION GAP: 9 (ref 5–15)
AST: 25 U/L (ref 15–41)
Albumin: 4.4 g/dL (ref 3.5–5.0)
Alkaline Phosphatase: 54 U/L (ref 38–126)
BUN: 13 mg/dL (ref 8–23)
CO2: 29 mmol/L (ref 22–32)
CREATININE: 1.31 mg/dL — AB (ref 0.61–1.24)
Calcium: 9.5 mg/dL (ref 8.9–10.3)
Chloride: 102 mmol/L (ref 98–111)
GFR, EST NON AFRICAN AMERICAN: 54 mL/min — AB (ref >60)
Glucose, Bld: 192 mg/dL — ABNORMAL HIGH (ref 70–99)
Potassium: 4.3 mmol/L (ref 3.5–5.1)
SODIUM: 140 mmol/L (ref 135–145)
Total Bilirubin: 0.8 mg/dL (ref 0.3–1.2)
Total Protein: 7.4 g/dL (ref 6.5–8.1)

## 2017-09-20 LAB — LIPASE, BLOOD: LIPASE: 59 U/L — AB (ref 11–51)

## 2017-09-20 MED ORDER — SODIUM CHLORIDE 0.9 % IV BOLUS
1000.0000 mL | Freq: Once | INTRAVENOUS | Status: AC
  Administered 2017-09-20: 1000 mL via INTRAVENOUS

## 2017-09-20 MED ORDER — IOPAMIDOL (ISOVUE-300) INJECTION 61%
INTRAVENOUS | Status: AC
  Filled 2017-09-20: qty 100

## 2017-09-20 MED ORDER — IOPAMIDOL (ISOVUE-300) INJECTION 61%
100.0000 mL | Freq: Once | INTRAVENOUS | Status: AC | PRN
  Administered 2017-09-20: 100 mL via INTRAVENOUS

## 2017-09-20 NOTE — ED Triage Notes (Addendum)
Pt reports waking up around 1 pm with complaints of dizziness, nausea, denies vomiting. Pt reports some pain to chest and abdomen. Pt took 8 mg of Zofran prior to arrival, reports some relief.

## 2017-09-20 NOTE — ED Notes (Signed)
Patient transported to Ultrasound 

## 2017-09-20 NOTE — H&P (Addendum)
[facility] History and Physical  Gregory Friedman YBF:[id number] DOB: March 13, 1950 DOA: 09/20/2017  Referring physician: Dr Domenic Moras PCP: [name], MD   Chief Complaint: Chest pain/ abd pain  HPI: Gregory Friedman is a 67 y.o. male w/ hx of NIDDM/ MI/ HTN/ gout/ CAD w CABG in 2005/ H&N cancer treated in 2014, presented to ED today w/ chest pain and abdominal pain.  Also fatigued.  Had diverticulitis several wks ago treated w/ po abx.  CT abdomen done by ED showed no acute changes, except there were some small layering gallstones but no RUQ tenderness.  EKG showed NSR , no sig ST ^ or down.   We were asked to see for admission.   Patient states he was treated w/ coarse of po abx for "diverticulitis" for about 10 days.  He had diverticulitis treated in 2001 per patient. Then had chest pain in 2003 w/ cardiac stent and in 2005 had CP again and had CABG. F/B Dr Harrington Challenger for cardiology.   Today he was at home and in the afternoon and kind of sudden onset of nausea and abd pain w/ chest pain in the midline w/ associated dizziness.  This went on for about an hour at home off and on.  No diarrhea.  He came to ED because he was worried it might be heart or diverticulitis.  CT abd here showed only gallstone and limitied US showed gallstones w/o acute cholecystitis. His troponins are neg x 1.  Abd feels better. EKG is w/o ischemia changes.     Chart:  - 05/2010 > Hx CABG had LHC showing patent stents/ bypass grafts .   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  NIDDM, HTN. Underwent removal of some teeth and radical neck dissection.    ROS  no joint pain   no HA  no blurry vision  no rash  no diarrhea  no dysuria  no difficulty voiding  no change in urine color    Past Medical History  Past Medical History:  Diagnosis Date  . Anxiety   . BPH (benign prostatic hyperplasia)   . Cancer ([place]) 2014   neck area  . Colon polyps   . Coronary artery disease   .  Depression   . Diverticulosis   . GERD (gastroesophageal reflux disease)   . History of gout    toe  . 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 Hyperlipidemia   . Hypertension    states under control with med., has been on med. since 2003  . 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 Myocardial infarction ([place]) 2003  . Non-insulin dependent type 2 diabetes mellitus ([place])   . PONV (postoperative nausea and vomiting)   . S/P radiation therapy    Received 8 fractions, then stopped due to mucositis with Radiation/ Cisplatin  . Status post dilation of esophageal narrowing   . Trigger thumb of right hand 01/2016   Past Surgical History  Past Surgical History:  Procedure Laterality Date  . CARDIAC CATHETERIZATION  05/25/2010  . CARDIOVASCULAR STRESS TEST  07/2009  . COLONOSCOPY WITH PROPOFOL  02/16/2011  . CORONARY ANGIOPLASTY  2003; 2006   2 stents in heart  . CORONARY ARTERY BYPASS GRAFT  2005  . CYSTOSCOPY  02/09/2011   Procedure: CYSTOSCOPY;  Surgeon: Marissa Nestle, MD;  Location: [facility];  Service: Urology;  Laterality: N/A;  . ESOPHAGOGASTRODUODENOSCOPY (EGD) WITH ESOPHAGEAL DILATION  02/16/2011   with Propofol  . FOOT ARTHRODESIS, SUBTALAR Right 10/13/2009  . MASS EXCISION N/A 03/13/2012   Procedure: EXCISION LEFT BUCCAL MUCOSA;  Surgeon: Rozetta Nunnery, MD;  Location: [place];  Service: ENT;  Laterality: N/A;  . RADICAL NECK DISSECTION Left 03/13/2012   Procedure: RADICAL NECK DISSECTION;  Surgeon: Rozetta Nunnery, MD;  Location: [place];  Service: ENT;  Laterality: Left;  . TOOTH EXTRACTION N/A 03/13/2012   Procedure: EXTRACTION MOLARS;  Surgeon: Rozetta Nunnery, MD;  Location: [place];  Service: ENT;  Laterality: N/A;  . TRIGGER FINGER RELEASE Right 02/24/2016   Procedure: RELEASE TRIGGER FINGER/A-1 PULLEY RIGHT THUMB;  Surgeon: Daryll Brod, MD;  Location: [place];   Service: Orthopedics;  Laterality: Right;  FAB   Family History  Family History  Problem Relation Age of Onset  . Stroke Sister   . Diabetes Brother   . Seizures Mother   . Asthma Father   . Thyroid cancer Daughter   . Diabetes Sister   . Parkinson's disease Brother   . Heart disease Brother   . Diabetes Brother   . Colon cancer Neg Hx    Social History  reports that he has never smoked. He has never used smokeless tobacco. He reports that he does not drink alcohol or use drugs. Allergies No Known Allergies Home medications Prior to Admission medications   Medication Sig Start Date End Date Taking? Authorizing Provider  amLODipine (NORVASC) 2.5 MG tablet Take 1 tablet (2.5 mg total) by mouth daily. Patient not taking: Reported on 08/31/2017 03/29/17   Fay Records, MD  antiseptic oral rinse Charlene Brooke) LIQD  05/16/17   [provider]  aspirin EC 81 MG tablet Take 81 mg by mouth daily.    [provider]  ciprofloxacin (CIPRO) 500 MG tablet Take 1 tablet (500 mg total) by mouth 2 (two) times daily. Starting tonight (08/31/17) at dinner time 08/31/17   Street, [place], PA-C  co-enzyme Q-10 30 MG capsule Take 30 mg by mouth daily.    [provider]  enalapril (VASOTEC) 10 MG tablet Take 1 tablet (10 mg total) by mouth daily. 09/06/15   Rita Ohara, MD  glucose blood test strip Use as instructed 01/20/13   Rita Ohara, MD  HYDROcodone-acetaminophen [facility]) 5-325 MG tablet Take 1 tablet by mouth every 6 (six) hours as needed for severe pain. 08/31/17   Street, [place], PA-C  Lancets MISC Please test blood sugars daily 01/20/13   Rita Ohara, MD  levothyroxine (SYNTHROID, LEVOTHROID) 25 MCG tablet Take 25 mcg by mouth daily before breakfast.    [provider]  metFORMIN (GLUCOPHAGE-XR) 500 MG 24 hr tablet TAKE 2 TABLETS BY MOUTH  DAILY WITH BREAKFAST Patient taking differently: Take 500 mg by mouth 2 (two) times daily.  12/03/15   Rita Ohara, MD  metoprolol  succinate (TOPROL XL) 25 MG 24 hr tablet Take 1 tablet (25 mg total) by mouth daily. Patient not taking: Reported on 08/31/2017 06/25/17   Fay Records, MD  metoprolol succinate (TOPROL-XL) 25 MG 24 hr tablet Take 50 mg by mouth daily. 06/07/15   [provider]  Multiple Vitamins-Minerals (MULTIVITAMIN WITH MINERALS) tablet Take 1 tablet by mouth 2 (two) times daily.     [provider]  naproxen (NAPROSYN) 500 MG tablet Take 1 tablet (500 mg total) by mouth 2 (two) times daily as needed for  mild pain, moderate pain or headache (TAKE WITH MEALS.). 08/31/17   Street, [place], PA-C  nitroGLYCERIN (NITROSTAT) 0.4 MG SL tablet Place 1 tablet (0.4 mg total) under the tongue every 5 (five) minutes as needed. Chest pain 06/16/16   Fay Records, MD  omeprazole (PRILOSEC) 40 MG capsule TAKE 1 CAPSULE BY MOUTH  DAILY 12/03/15   Rita Ohara, MD  ondansetron (ZOFRAN ODT) 4 MG disintegrating tablet Take 1 tablet (4 mg total) by mouth every 8 (eight) hours as needed for nausea or vomiting. 08/31/17   Street, [place], PA-C  ranitidine (ZANTAC) 150 MG tablet Take 1 tablet (150 mg total) by mouth daily. Patient not taking: Reported on 08/31/2017 03/31/16   Zehr, Janett Billow D, PA-C  Saw Palmetto, Serenoa repens, 320 MG CAPS Take 1 capsule by mouth 2 (two) times daily.     [provider]  simvastatin (ZOCOR) 40 MG tablet TAKE 1 TABLET BY MOUTH AT  BEDTIME 12/03/15   Rita Ohara, MD   Liver Function Tests Recent Labs  Lab 09/20/17 1625  AST 25  ALT 24  ALKPHOS 54  BILITOT 0.8  PROT 7.4  ALBUMIN 4.4   Recent Labs  Lab 09/20/17 1625  LIPASE 59*   CBC Recent Labs  Lab 09/20/17 1625  WBC 4.3  HGB 13.3  HCT 41.6  MCV 86.0  PLT 048   Basic Metabolic Panel Recent Labs  Lab 09/20/17 1625  NA 140  K 4.3  CL 102  CO2 29  GLUCOSE 192*  BUN 13  CREATININE 1.31*  CALCIUM 9.5     Vitals:   09/20/17 2145 09/20/17 2200 09/20/17 2215 09/20/17 2230  BP: (!) 152/67 135/63 128/60 136/67   Pulse: (!) 51 (!) 51 (!) 48 (!) 51  Resp: 13 16 (!) 8 14  Temp:      TempSrc:      SpO2: 99% 98% 97% 98%  Weight:      Height:       Exam: Gen alert, wdwn no distress, calm No rash, cyanosis or gangrene Sclera anicteric, throat clear  No jvd or bruits Chest clear bilat RRR no MRG Abd soft ntnd no mass or ascites +bs GU normal male MS no joint effusions or deformity Ext no LE or UE edema / no wounds or ulcers Neuro is alert, Ox 3 , nf     Home meds:  - amlodipine 2.5 qd (not taking) / enalapril 10 qd/ metoprolol succinate 25 qd  - simvastatin 40 hs/ ranitidine 150 qd/ omeprazole 40 qd/ ecasa 81/ naprox 500   - hydrocodone aceta 5- 325 every 6 prn  - metformin 500 bid   baseline creat is 1.2- 1.5 from 2015- 2019.    Na 140  K 4.3  CO2 29  BN 13  Cr 1.31  eGFR 55   Ca 9.5  Alb 4.4  Glu 192  LFT's ok   WBC 4k  Hb 13  plt 189k  UA negative   Trop 0.01  EKG (independ reviewed) > NSR, 50 bpm, no acute changes CXR (independ reviewed) > clear, no acute disease   Assessment: 1. Chest pain - non-ischemic EKG, trop neg x 1.  Admit for CP r/o.  Pt has hx of CABG.   2. Abd pain/ vomiting/ dizziness - not sure cause but appears to have resolved.  CT abd neg except for gallstones - abd [place] does not show GB infection.  Better now.   3. CKD stage II - baseline creat 1.2-  1.5, creat at baseline 4. HTN - on 3 bp meds 5. CAD hx CABG 6. H/o head and neck Ca - treated in 2014 w/ surgery + other 7. DM2 - non insulin dependent; BS's up, holding metformin after IV contrast for abd CT. Will add SSI ac tid.    Plan - as above       [place] D [facility] Pager [phone]   If 7PM-7AM, please contact night-coverage [email or web address] Password [facility] 09/20/2017, 11:46 PM

## 2017-09-20 NOTE — ED Provider Notes (Signed)
[place] EMERGENCY DEPARTMENT Provider Note   CSN: [id number] Arrival date & time: 09/20/17  1605     History   Chief Complaint Chief Complaint  Patient presents with  . Dizziness  . Nausea    HPI Gregory Friedman is a 67 y.o. male.  The history is provided by the patient and the spouse. No language interpreter was used.  Dizziness     67 year old male with history of CAD, hypertension, diabetes, GERD, hyperlipidemia presenting complaining of abdominal pain.  Patient report earlier today he developed pain in his chest and abdomen which he describes a pressure sensation in his chest and burning sensation in his upper abdomen follows with lightheadedness, dizziness, nauseous and having some loose stools.  Also felt very weak which concerns him.  His symptom is mostly resolved without any specific treatment.  He did mention been diagnosed with diverticulitis several weeks prior and finished with his antibiotic.  He also has significant cardiac history and he was concern for potential cardiac problem.  He denies any fever, productive cough, hemoptysis, dysuria.  He has been compliant with his medication.  He denies alcohol or tobacco abuse.  Past Medical History:  Diagnosis Date  . Anxiety   . BPH (benign prostatic hyperplasia)   . Cancer ([place]) 2014   neck area  . Colon polyps   . Coronary artery disease   . Depression   . Diverticulosis   . GERD (gastroesophageal reflux disease)   . History of gout    toe  . 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 Hyperlipidemia   . Hypertension    states under control with med., has been on med. since 2003  . 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 Myocardial infarction ([place]) 2003  . Non-insulin dependent type 2 diabetes mellitus ([place])   . PONV (postoperative nausea and vomiting)   . S/P radiation therapy    Received 8 fractions,  then stopped due to mucositis with Radiation/ Cisplatin  . Status post dilation of esophageal narrowing   . Trigger thumb of right hand 01/2016    Patient Active Problem List   Diagnosis Date Noted  . History of colonic polyps 04/04/2016  . Dysphagia 04/04/2016  . Bloating 04/04/2016  . Diarrhea 04/04/2016  . Chest pain 07/15/2015  . Diabetes mellitus, without long-term current use of insulin ([place]) 07/15/2015  . Pain in the chest   . HLD (hyperlipidemia)   . S/P CABG (coronary artery bypass graft)   . Noncompliance with medications 07/08/2015  . Acute gout 04/19/2015  . Type 2 diabetes mellitus not at goal [facility]) 05/21/2014  . GERD (gastroesophageal reflux disease) 05/14/2014  . Depression, major, in remission ([place]) 03/27/2013  . Allergic rhinitis, cause unspecified 03/27/2013  . Buccal mucosa squamous cell carcinoma ([place]) 03/05/2012  . Depressive disorder, not elsewhere classified 12/06/2011  . Type II diabetes mellitus, uncontrolled ([place]) 05/24/2011  . Nonspecific (abnormal) findings on radiological and other examination of gastrointestinal tract 02/10/2011  . Dysphagia, unspecified(787.20) 02/10/2011  . Palpitations 02/02/2011  . Type II or unspecified type diabetes mellitus without mention of complication, not stated as uncontrolled 11/25/2010  . Dizziness 07/15/2010  . DIAB W/OPHTH MANIFESTS TYPE I [JUV TYPE] UNCNTRL 08/02/2009  . Mixed hyperlipidemia 08/02/2009  . HYPERTENSION, BENIGN 08/02/2009  . Coronary atherosclerosis 08/02/2009    Past Surgical History:  Procedure Laterality Date  . CARDIAC  CATHETERIZATION  05/25/2010  . CARDIOVASCULAR STRESS TEST  07/2009  . COLONOSCOPY WITH PROPOFOL  02/16/2011  . CORONARY ANGIOPLASTY  2003; 2006   2 stents in heart  . CORONARY ARTERY BYPASS GRAFT  2005  . CYSTOSCOPY  02/09/2011   Procedure: CYSTOSCOPY;  Surgeon: Marissa Nestle, MD;  Location: [facility];  Service: Urology;  Laterality: N/A;  . ESOPHAGOGASTRODUODENOSCOPY (EGD)  WITH ESOPHAGEAL DILATION  02/16/2011   with Propofol  . FOOT ARTHRODESIS, SUBTALAR Right 10/13/2009  . MASS EXCISION N/A 03/13/2012   Procedure: EXCISION LEFT BUCCAL MUCOSA;  Surgeon: Rozetta Nunnery, MD;  Location: [place];  Service: ENT;  Laterality: N/A;  . RADICAL NECK DISSECTION Left 03/13/2012   Procedure: RADICAL NECK DISSECTION;  Surgeon: Rozetta Nunnery, MD;  Location: [place];  Service: ENT;  Laterality: Left;  . TOOTH EXTRACTION N/A 03/13/2012   Procedure: EXTRACTION MOLARS;  Surgeon: Rozetta Nunnery, MD;  Location: [place];  Service: ENT;  Laterality: N/A;  . TRIGGER FINGER RELEASE Right 02/24/2016   Procedure: RELEASE TRIGGER FINGER/A-1 PULLEY RIGHT THUMB;  Surgeon: Daryll Brod, MD;  Location: [place];  Service: Orthopedics;  Laterality: Right;  FAB        Home Medications    Prior to Admission medications   Medication Sig Start Date End Date Taking? Authorizing Provider  amLODipine (NORVASC) 2.5 MG tablet Take 1 tablet (2.5 mg total) by mouth daily. Patient not taking: Reported on 08/31/2017 03/29/17   Fay Records, MD  antiseptic oral rinse Charlene Brooke) LIQD  05/16/17   [provider]  aspirin EC 81 MG tablet Take 81 mg by mouth daily.    [provider]  cetirizine (ZYRTEC) 10 MG tablet Take 10 mg by mouth daily. 07/23/17   [provider]  ciprofloxacin (CIPRO) 500 MG tablet Take 1 tablet (500 mg total) by mouth 2 (two) times daily. Starting tonight (08/31/17) at dinner time 08/31/17   Street, [place], PA-C  co-enzyme Q-10 30 MG capsule Take 30 mg by mouth daily.    [provider]  enalapril (VASOTEC) 10 MG tablet Take 1 tablet (10 mg total) by mouth daily. 09/06/15   Rita Ohara, MD  glucose blood test strip Use as instructed 01/20/13   Rita Ohara, MD  HYDROcodone-acetaminophen [facility]) 5-325 MG tablet Take 1 tablet by mouth every 6 (six) hours as needed for severe pain. 08/31/17   Street, [place], PA-C  Lancets MISC Please  test blood sugars daily 01/20/13   Rita Ohara, MD  levothyroxine (SYNTHROID, LEVOTHROID) 25 MCG tablet Take 25 mcg by mouth daily before breakfast.    [provider]  metFORMIN (GLUCOPHAGE-XR) 500 MG 24 hr tablet TAKE 2 TABLETS BY MOUTH  DAILY WITH BREAKFAST Patient taking differently: Take 500 mg by mouth 2 (two) times daily.  12/03/15   Rita Ohara, MD  metoprolol succinate (TOPROL XL) 25 MG 24 hr tablet Take 1 tablet (25 mg total) by mouth daily. Patient not taking: Reported on 08/31/2017 06/25/17   Fay Records, MD  metoprolol succinate (TOPROL-XL) 25 MG 24 hr tablet Take 50 mg by mouth daily. 06/07/15   [provider]  Multiple Vitamins-Minerals (MULTIVITAMIN WITH MINERALS) tablet Take 1 tablet by mouth 2 (two) times daily.     [provider]  naproxen (NAPROSYN) 500 MG tablet Take 1 tablet (500 mg total) by mouth 2 (two) times daily as needed for mild pain, moderate pain or headache (TAKE WITH MEALS.). 08/31/17   Street, [place],  PA-C  nitroGLYCERIN (NITROSTAT) 0.4 MG SL tablet Place 1 tablet (0.4 mg total) under the tongue every 5 (five) minutes as needed. Chest pain 06/16/16   Fay Records, MD  omeprazole (PRILOSEC) 40 MG capsule TAKE 1 CAPSULE BY MOUTH  DAILY 12/03/15   Rita Ohara, MD  ondansetron (ZOFRAN ODT) 4 MG disintegrating tablet Take 1 tablet (4 mg total) by mouth every 8 (eight) hours as needed for nausea or vomiting. 08/31/17   Street, [place], PA-C  ranitidine (ZANTAC) 150 MG tablet Take 1 tablet (150 mg total) by mouth daily. Patient not taking: Reported on 08/31/2017 03/31/16   Zehr, Janett Billow D, PA-C  Saw Palmetto, Serenoa repens, 320 MG CAPS Take 1 capsule by mouth 2 (two) times daily.     [provider]  simvastatin (ZOCOR) 40 MG tablet TAKE 1 TABLET BY MOUTH AT  BEDTIME 12/03/15   Rita Ohara, MD    Family History Family History  Problem Relation Age of Onset  . Stroke Sister   . Diabetes Brother   . Seizures Mother   . Asthma Father   .  Thyroid cancer Daughter   . Diabetes Sister   . Parkinson's disease Brother   . Heart disease Brother   . Diabetes Brother   . Colon cancer Neg Hx     Social History Social History   Tobacco Use  . Smoking status: Never Smoker  . Smokeless tobacco: Never Used  Substance Use Topics  . Alcohol use: No  . Drug use: No     Allergies   Patient has no known allergies.   Review of Systems Review of Systems  Neurological: Positive for dizziness.  All other systems reviewed and are negative.    Physical Exam Updated Vital Signs BP 112/74 (BP Location: Left Arm)   Pulse (!) 51   Temp 97.9 F (36.6 C) (Oral)   Resp 14   Ht 5\' 8"  (1.727 m)   Wt 83.9 kg   SpO2 98%   BMI 28.13 kg/m   Physical Exam  Constitutional: He appears well-developed and well-nourished. No distress.  HENT:  Head: Atraumatic.  Eyes: Conjunctivae are normal.  Neck: Neck supple.  Cardiovascular:  Bradycardia without murmur rubs or gallops  Pulmonary/Chest: Effort normal and breath sounds normal.  Abdominal: Soft. Bowel sounds are normal. He exhibits no distension. There is tenderness (Diffuse abdominal tenderness without focal point tenderness).  Musculoskeletal: He exhibits no edema.  Neurological: He is alert.  Skin: No rash noted.  Psychiatric: He has a normal mood and affect.  Nursing note and vitals reviewed.    ED Treatments / Results  Labs (all labs ordered are listed, but only abnormal results are displayed) Labs Reviewed  LIPASE, BLOOD - Abnormal; Notable for the following components:      Result Value   Lipase 59 (*)    All other components within normal limits  COMPREHENSIVE METABOLIC PANEL - Abnormal; Notable for the following components:   Glucose, Bld 192 (*)    Creatinine, Ser 1.31 (*)    GFR calc non Af Amer 54 (*)    All other components within normal limits  URINALYSIS, ROUTINE W REFLEX MICROSCOPIC - Abnormal; Notable for the following components:   APPearance HAZY  (*)    All other components within normal limits  CBC  I-STAT TROPONIN, ED    EKG EKG Interpretation  Date/Time:  Thursday September 20 2017 16:09:58 EDT Ventricular Rate:  52 PR Interval:  142 QRS Duration: 84 QT Interval:  444 QTC Calculation: 412 R Axis:   -31 Text Interpretation:  Sinus bradycardia Left axis deviation Anterior infarct , age undetermined Abnormal ECG similar to prior 7.17 Confirmed by Aletta Edouard [id number]) on 09/20/2017 9:08:15 PM   Radiology Ct Abdomen Pelvis W Contrast  Result Date: 09/20/2017 CLINICAL DATA:  Generalized abdominal pain EXAM: CT ABDOMEN AND PELVIS WITH CONTRAST TECHNIQUE: Multidetector CT imaging of the abdomen and pelvis was performed using the standard protocol following bolus administration of intravenous contrast. CONTRAST:  184mL ISOVUE-300 IOPAMIDOL (ISOVUE-300) INJECTION 61% COMPARISON:  08/31/2017 FINDINGS: Lower chest: Dependent atelectasis.  No acute findings. Hepatobiliary: Small gallstones layering within the gallbladder. No focal hepatic abnormality. Suspect mild fatty infiltration. Pancreas: No focal abnormality or ductal dilatation. Spleen: No focal abnormality.  Normal size. Adrenals/Urinary Tract: No adrenal abnormality. No focal renal abnormality. No stones or hydronephrosis. Urinary bladder is unremarkable. Stomach/Bowel: Normal appendix. Sigmoid diverticulosis. No active diverticulitis. Stomach and small bowel decompressed. Vascular/Lymphatic: Aortic atherosclerosis. No enlarged abdominal or pelvic lymph nodes. Reproductive: Prostate enlargement.  Central calcifications. Other: No free fluid or free air. Bilateral inguinal hernias containing fat. Musculoskeletal: No acute bony abnormality. IMPRESSION: Mild fatty infiltration of the liver. Small layering gallstones. Sigmoid diverticulosis.  No active diverticulitis. Prostate enlargement. Bilateral inguinal hernias containing fat. Aortic atherosclerosis. Electronically Signed   By: Rolm Baptise M.D.   On: 09/20/2017 21:13    Procedures Procedures (including critical care time)  Medications Ordered in ED Medications  iopamidol (ISOVUE-300) 61 % injection (has no administration in time range)  sodium chloride 0.9 % bolus 1,000 mL (0 mLs Intravenous Stopped 09/20/17 2047)  iopamidol (ISOVUE-300) 61 % injection 100 mL (100 mLs Intravenous Contrast Given 09/20/17 2024)     Initial Impression / Assessment and Plan / ED Course  I have reviewed the triage vital signs and the nursing notes.  Pertinent labs & imaging results that were available during my care of the patient were reviewed by me and considered in my medical decision making (see chart for details).  Clinical Course as of Sep 20 2213  Thu Sep 20, 1217  [id number] 67 year old male complaining of acute onset of lightheadedness dizziness and diffuse soreness of his chest and abdomen.  He states he feels back to baseline now.  His blood pressure was elevated when this happened.  His work-up here is been fairly unremarkable including a troponin and lab work and CT abdomen.   [MB]    Clinical Course User Index [MB] Hayden Rasmussen, MD    BP 128/60   Pulse (!) 48   Temp 97.9 F (36.6 C) (Oral)   Resp (!) 8   Ht 5\' 8"  (1.727 m)   Wt 83.9 kg   SpO2 97%   BMI 28.13 kg/m    Final Clinical Impressions(s) / ED Diagnoses   Final diagnoses:  Chest pain, unspecified type    ED Discharge Orders    None     7:45 PM Patient here with pain to his chest and abdomen that started earlier today but mostly resolved.  He does have significant cardiac history and does complain of nausea dizziness and chest discomfort.  He will need a cardiac work-up.  He was also recently treated for diverticulitis and now having this abdominal pain.  He would benefit from a repeat abdominal and pelvic CT scan.  He is hemodynamically stable.  10:18 PM Initial EKG unchanged from prior, normal initial troponin, chest pain is mostly resolved,  chest x-ray unremarkable, abdominal pelvis CT scan  without acute changes.  Small layering gallstones were noted however patient has no significant right upper quadrant tenderness on my exam.  I did order an abdominal limited ultrasound to rule out gallbladder etiology  Appreciate consultation from Medicine Dr. Melvia Heaps who agrees to see and admit pt for cardiac rule out.  His HEART score is 5, which puts him at moderate risk of MACE.  Pt current c/p free.     Domenic Moras, PA-C 09/20/17 2322    Hayden Rasmussen, MD 09/21/17 [phone]

## 2017-09-20 NOTE — ED Provider Notes (Signed)
Patient placed in Quick Look pathway, seen and evaluated   Chief Complaint:   HPI:   Gregory Friedman is a 67 y.o. male who presents to the ED with c/o feeling dizzy and having nausea. The symptoms started about 1 am. Patient reports pain in abdomen that goes to the chest. Patient took Zofran 8 mg today and it did help.  ROS: GI: nausea, abdominal pain  Neuro: dizziness  Physical Exam:  BP (!) 182/84 (BP Location: Right Arm)   Pulse (!) 54   Temp 98.6 F (37 C) (Oral)   Resp 16   Ht 5\' 8"  (1.727 m)   Wt 83.9 kg   SpO2 99%   BMI 28.13 kg/m    Gen: No distress  Neuro: Awake and Alert, patient ambulatory with steady gait.  Skin: Warm and dry     Initiation of care has begun. The patient has been counseled on the process, plan, and necessity for staying for the completion/evaluation, and the remainder of the medical screening examination    Ashley Murrain, NP 09/20/17 [place]    Hayden Rasmussen, MD 09/21/17 [phone]

## 2017-09-20 NOTE — ED Notes (Signed)
Patient transported to X-ray 

## 2017-09-21 ENCOUNTER — Encounter (HOSPITAL_COMMUNITY): Payer: Self-pay

## 2017-09-21 DIAGNOSIS — R109 Unspecified abdominal pain: Secondary | ICD-10-CM | POA: Diagnosis present

## 2017-09-21 DIAGNOSIS — Z8589 Personal history of malignant neoplasm of other organs and systems: Secondary | ICD-10-CM

## 2017-09-21 DIAGNOSIS — R079 Chest pain, unspecified: Secondary | ICD-10-CM | POA: Diagnosis not present

## 2017-09-21 DIAGNOSIS — E118 Type 2 diabetes mellitus with unspecified complications: Secondary | ICD-10-CM

## 2017-09-21 DIAGNOSIS — R112 Nausea with vomiting, unspecified: Secondary | ICD-10-CM | POA: Diagnosis present

## 2017-09-21 LAB — BASIC METABOLIC PANEL
ANION GAP: 7 (ref 5–15)
BUN: 13 mg/dL (ref 8–23)
CHLORIDE: 104 mmol/L (ref 98–111)
CO2: 27 mmol/L (ref 22–32)
Calcium: 8.9 mg/dL (ref 8.9–10.3)
Creatinine, Ser: 1.32 mg/dL — ABNORMAL HIGH (ref 0.61–1.24)
GFR, EST NON AFRICAN AMERICAN: 54 mL/min — AB (ref >60)
Glucose, Bld: 160 mg/dL — ABNORMAL HIGH (ref 70–99)
Potassium: 3.8 mmol/L (ref 3.5–5.1)
SODIUM: 138 mmol/L (ref 135–145)

## 2017-09-21 LAB — TROPONIN I: Troponin I: <0.03 ng/mL (ref <0.03)

## 2017-09-21 LAB — GLUCOSE, CAPILLARY
GLUCOSE-CAPILLARY: 127 mg/dL — AB (ref 70–99)
GLUCOSE-CAPILLARY: 171 mg/dL — AB (ref 70–99)

## 2017-09-21 LAB — CBG MONITORING, ED: GLUCOSE-CAPILLARY: 103 mg/dL — AB (ref 70–99)

## 2017-09-21 LAB — TSH: TSH: 3.211 u[IU]/mL (ref 0.350–4.500)

## 2017-09-21 MED ORDER — ENOXAPARIN SODIUM 40 MG/0.4ML ~~LOC~~ SOLN: 40.0000 mg | SUBCUTANEOUS | Status: DC

## 2017-09-21 MED ORDER — ACETAMINOPHEN 650 MG RE SUPP: 650.0000 mg | Freq: Four times a day (QID) | RECTAL | Status: DC | PRN

## 2017-09-21 MED ORDER — SODIUM CHLORIDE 0.45 % IV SOLN
INTRAVENOUS | Status: AC
  Administered 2017-09-21: 02:00:00 via INTRAVENOUS

## 2017-09-21 MED ORDER — ASPIRIN EC 81 MG PO TBEC
81.0000 mg | DELAYED_RELEASE_TABLET | Freq: Every day | ORAL | Status: DC
  Administered 2017-09-21: 81 mg via ORAL
  Filled 2017-09-21: qty 1

## 2017-09-21 MED ORDER — INSULIN ASPART 100 UNIT/ML ~~LOC~~ SOLN: 0.0000 [IU] | Freq: Three times a day (TID) | SUBCUTANEOUS | Status: DC

## 2017-09-21 MED ORDER — METOPROLOL SUCCINATE ER 25 MG PO TB24: 50.0000 mg | ORAL_TABLET | Freq: Every day | ORAL | 0 refills | Status: DC

## 2017-09-21 MED ORDER — ENALAPRIL MALEATE 10 MG PO TABS
10.0000 mg | ORAL_TABLET | Freq: Every day | ORAL | Status: DC
  Filled 2017-09-21: qty 1

## 2017-09-21 MED ORDER — INSULIN ASPART 100 UNIT/ML ~~LOC~~ SOLN: 0.0000 [IU] | Freq: Every day | SUBCUTANEOUS | Status: DC

## 2017-09-21 MED ORDER — HYDROCODONE-ACETAMINOPHEN 5-325 MG PO TABS: 1.0000 | ORAL_TABLET | Freq: Four times a day (QID) | ORAL | Status: DC | PRN

## 2017-09-21 MED ORDER — ACETAMINOPHEN 325 MG PO TABS: 650.0000 mg | ORAL_TABLET | Freq: Four times a day (QID) | ORAL | Status: DC | PRN

## 2017-09-21 MED ORDER — METOPROLOL SUCCINATE ER 50 MG PO TB24
50.0000 mg | ORAL_TABLET | Freq: Every day | ORAL | Status: DC
  Administered 2017-09-21: 50 mg via ORAL
  Filled 2017-09-21: qty 1

## 2017-09-21 MED ORDER — ONDANSETRON HCL 4 MG PO TABS: 4.0000 mg | ORAL_TABLET | Freq: Four times a day (QID) | ORAL | Status: DC | PRN

## 2017-09-21 MED ORDER — ONDANSETRON HCL 4 MG/2ML IJ SOLN: 4.0000 mg | Freq: Four times a day (QID) | INTRAMUSCULAR | Status: DC | PRN

## 2017-09-21 MED ORDER — PANTOPRAZOLE SODIUM 40 MG PO TBEC
40.0000 mg | DELAYED_RELEASE_TABLET | Freq: Every day | ORAL | Status: DC
  Administered 2017-09-21: 40 mg via ORAL
  Filled 2017-09-21: qty 1

## 2017-09-21 MED ORDER — SIMVASTATIN 40 MG PO TABS: 40.0000 mg | ORAL_TABLET | Freq: Every day | ORAL | Status: DC

## 2017-09-21 MED ORDER — ADULT MULTIVITAMIN W/MINERALS CH
1.0000 | ORAL_TABLET | Freq: Two times a day (BID) | ORAL | Status: DC
  Administered 2017-09-21: 1 via ORAL
  Filled 2017-09-21: qty 1

## 2017-09-21 NOTE — Consult Note (Addendum)
Cardiology Consultation:   Patient ID: Gregory Friedman; [id number]; 06/01/1950   Admit date: 09/20/2017 Date of Consult: 09/21/2017  Primary Care Provider: [name], MD Primary Cardiologist: Dorris Carnes, MD   Patient Profile:   Gregory Friedman is a 67 y.o. 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 Eliseo Squires, Internal Medicine.  History of Present Illness:   Gregory Friedman is followed by Dr. Harrington Challenger. He has a h/o CAD s/p PCI and CABG in 2005 while living in [place]. Additional PMH includes HTN, T2DM, HLD and anxiety. He also has a h/o palpitations, however heart monitor was ordered in November 2017 for this and showed no arrthymias. His last cardiac cath was in 2012 and showed atretic LIMA-LAD and occluded SVG-OM, patent stents in the LAD and in the prox portion of the OM2 of the Cx artery and moderate, nonobstructive disease in the diagonal, Cx and RCA. Medical therapy was elected. In 2017, he had a NST which showed an old infarct but no ischemia.   He was recently seen by Dr. Harrington Challenger in clinic on 06/25/17 for routine CV examination. Per documentation, he denied CP and dyspnea. His meds were keep the same. There were no changes, although Dr. Harrington Challenger mentioned that he may at some point need reduction of metoprolol given slow HR. Lipids were also noted to be ok as lipid panel in March showed controlled LDL at 67 mg/dL. Pt was advised to f/u again in September.   He now presents to [facility] for evaluation of abdominal and chest pain. He was in his usual state of health until yesterday afternoon. He woke feeling fine and ate breakfast round 11 AM which consisted of oatmeal, 2 egg whites and a banana. He had a BM, which is typical after eating breakfast. However after his first BM, he had to have a 2nd one, which  was unusual. He also felt nauseated and had diffuse abdominal pain and notes his abdomen was tender with palpation. He had radiation of pain to his left chest. Felt like pressure. This occurred around 1 PM. It was different from prior angina, which felt more like indigestion and was mid sternal. He had no associated dyspnea or diaphoresis. He was concerned because he thought it was diverticulitis, so he came to the ED. CT abd here showed only gallstone and limitied US showed gallstones w/o acute cholecystitis. His symptoms resolved spontaneously and he notes his CP only lasted ~1 hr.  He has been admitted by IM. Cardiac enzymes are negative x 2. EKG nonischemic but shows sinus bradycardia at 50 bpm. He was observed to have a burst of atrial tachycardia on tele but asymptomatic. When the RN checked on him, he was up at the sink brushing his teeth. He felt no CP, dyspnea, palpitations, dizziness, syncope or near syncope at the time.   Pt notes feeling better with no further abdominal or chest pain. Feels back to his usual self.  He ate breakfast this AM and no GI symptoms.    Past Medical History:  Diagnosis Date  . Anxiety   . BPH (benign prostatic hyperplasia)   . Cancer ([place]) 2014   neck area  . Colon polyps   . Coronary artery disease   . Depression   . Diverticulosis   . GERD (gastroesophageal reflux disease)   .  History of gout    toe  . 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 Hyperlipidemia   . Hypertension    states under control with med., has been on med. since 2003  . 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 Myocardial infarction ([place]) 2003  . Non-insulin dependent type 2 diabetes mellitus ([place])   . PONV (postoperative nausea and vomiting)   . S/P radiation therapy    Received 8 fractions, then stopped due to mucositis with Radiation/ Cisplatin  . Status post dilation of esophageal narrowing     . Trigger thumb of right hand 01/2016    Past Surgical History:  Procedure Laterality Date  . CARDIAC CATHETERIZATION  05/25/2010  . CARDIOVASCULAR STRESS TEST  07/2009  . COLONOSCOPY WITH PROPOFOL  02/16/2011  . CORONARY ANGIOPLASTY  2003; 2006   2 stents in heart  . CORONARY ARTERY BYPASS GRAFT  2005  . CYSTOSCOPY  02/09/2011   Procedure: CYSTOSCOPY;  Surgeon: Marissa Nestle, MD;  Location: [facility];  Service: Urology;  Laterality: N/A;  . ESOPHAGOGASTRODUODENOSCOPY (EGD) WITH ESOPHAGEAL DILATION  02/16/2011   with Propofol  . FOOT ARTHRODESIS, SUBTALAR Right 10/13/2009  . MASS EXCISION N/A 03/13/2012   Procedure: EXCISION LEFT BUCCAL MUCOSA;  Surgeon: Rozetta Nunnery, MD;  Location: [facility];  Service: ENT;  Laterality: N/A;  . RADICAL NECK DISSECTION Left 03/13/2012   Procedure: RADICAL NECK DISSECTION;  Surgeon: Rozetta Nunnery, MD;  Location: [facility];  Service: ENT;  Laterality: Left;  . TOOTH EXTRACTION N/A 03/13/2012   Procedure: EXTRACTION MOLARS;  Surgeon: Rozetta Nunnery, MD;  Location: [place];  Service: ENT;  Laterality: N/A;  . TRIGGER FINGER RELEASE Right 02/24/2016   Procedure: RELEASE TRIGGER FINGER/A-1 PULLEY RIGHT THUMB;  Surgeon: Daryll Brod, MD;  Location: [facility];  Service: Orthopedics;  Laterality: Right;  FAB     Home Medications:  Prior to Admission medications   Medication Sig Start Date End Date Taking? Authorizing Provider  antiseptic oral rinse (BIOTENE) LIQD 15 mLs by Mouth Rinse route daily as needed for dry mouth.  05/16/17  Yes [provider]  aspirin EC 81 MG tablet Take 81 mg by mouth daily.   Yes [provider]  co-enzyme Q-10 30 MG capsule Take 30 mg by mouth daily.   Yes [provider]  enalapril (VASOTEC) 10 MG tablet Take 1 tablet (10 mg total) by mouth daily. 09/06/15  Yes Rita Ohara, MD  fluticasone (FLONASE) 50 MCG/ACT nasal spray Place 1-2 sprays into both nostrils at bedtime.   Yes  [provider]  glucose blood test strip Use as instructed 01/20/13  Yes Rita Ohara, MD  HYDROcodone-acetaminophen (NORCO) 5-325 MG tablet Take 1 tablet by mouth every 6 (six) hours as needed for severe pain. 08/31/17  Yes Street, [place], PA-C  Lancets MISC Please test blood sugars daily 01/20/13  Yes Rita Ohara, MD  metFORMIN (GLUCOPHAGE-XR) 500 MG 24 hr tablet TAKE 2 TABLETS BY MOUTH  DAILY WITH BREAKFAST Patient taking differently: Take 500 mg by mouth 2 (two) times daily.  12/03/15  Yes Rita Ohara, MD  metoprolol succinate (TOPROL XL) 25 MG 24 hr tablet Take 1 tablet (25 mg total) by mouth daily. Patient taking differently: Take 50 mg by mouth daily.  06/25/17  Yes Fay Records, MD  Multiple Vitamins-Minerals (MULTIVITAMIN WITH MINERALS) tablet Take 1  tablet by mouth 2 (two) times daily.    Yes [provider]  naproxen (NAPROSYN) 500 MG tablet Take 1 tablet (500 mg total) by mouth 2 (two) times daily as needed for mild pain, moderate pain or headache (TAKE WITH MEALS.). 08/31/17  Yes Street, [place], PA-C  nitroGLYCERIN (NITROSTAT) 0.4 MG SL tablet Place 1 tablet (0.4 mg total) under the tongue every 5 (five) minutes as needed. Chest pain 06/16/16  Yes Fay Records, MD  omeprazole (PRILOSEC) 40 MG capsule TAKE 1 CAPSULE BY MOUTH  DAILY 12/03/15  Yes Rita Ohara, MD  ondansetron (ZOFRAN ODT) 4 MG disintegrating tablet Take 1 tablet (4 mg total) by mouth every 8 (eight) hours as needed for nausea or vomiting. 08/31/17  Yes Street, [place], PA-C  Saw [place], Serenoa repens, 320 MG CAPS Take 1 capsule by mouth daily.    Yes [provider]  simvastatin (ZOCOR) 40 MG tablet TAKE 1 TABLET BY MOUTH AT  BEDTIME 12/03/15  Yes Rita Ohara, MD  ciprofloxacin (CIPRO) 500 MG tablet Take 1 tablet (500 mg total) by mouth 2 (two) times daily. Starting tonight (08/31/17) at dinner time Patient not taking: Reported on 09/20/2017 08/31/17   Street, [place], [place]    Inpatient  Medications: Scheduled Meds: . aspirin EC  81 mg Oral Daily  . enalapril  10 mg Oral Daily  . enoxaparin (LOVENOX) injection  40 mg Subcutaneous Q24H  . insulin aspart  0-5 Units Subcutaneous QHS  . insulin aspart  0-9 Units Subcutaneous TID WC  . metoprolol succinate  50 mg Oral Daily  . multivitamin with minerals  1 tablet Oral BID  . pantoprazole  40 mg Oral Daily  . simvastatin  40 mg Oral QHS   Continuous Infusions: . sodium chloride 75 mL/hr at 09/21/17 0219   PRN Meds: acetaminophen **OR** acetaminophen, HYDROcodone-acetaminophen, ondansetron **OR** ondansetron (ZOFRAN) IV  Allergies:   No Known Allergies  Social History:   Social History   Socioeconomic History  . Marital status: Married    Spouse name: Not on file  . Number of children: 3  . Years of education: Not on file  . Highest education level: Not on file  Occupational History  . Occupation: disabled    Employer: RETIRED    Comment: was in [place]  . Financial resource strain: Not on file  . Food insecurity:    Worry: Not on file    Inability: Not on file  . Transportation needs:    Medical: Not on file    Non-medical: Not on file  Tobacco Use  . Smoking status: Never Smoker  . Smokeless tobacco: Never Used  Substance and Sexual Activity  . Alcohol use: No  . Drug use: No  . Sexual activity: Not on file  Lifestyle  . Physical activity:    Days per week: Not on file    Minutes per session: Not on file  . Stress: Not on file  Relationships  . Social connections:    Talks on phone: Not on file    Gets together: Not on file    Attends religious service: Not on file    Active member of club or organization: Not on file    Attends meetings of clubs or organizations: Not on file    Relationship status: Not on file  . Intimate partner violence:    Fear of current or ex partner: Not on file    Emotionally abused: Not on file  Physically abused: Not on file    Forced sexual  activity: Not on file  Other Topics Concern  . Not on file  Social History Narrative   Married;  2 children in [place], son in [place]; 6 grandchildren       Family History:    Family History  Problem Relation Age of Onset  . Stroke Sister   . Diabetes Brother   . Seizures Mother   . Asthma Father   . Thyroid cancer Daughter   . Diabetes Sister   . Parkinson's disease Brother   . Heart disease Brother   . Diabetes Brother   . Colon cancer Neg Hx      ROS:  Please see the history of present illness.   All other ROS reviewed and negative.     Physical Exam/Data:   Vitals:   09/20/17 2245 09/20/17 2300 09/21/17 0125 09/21/17 0400  BP: (!) 153/74  (!) 177/79 140/70  Pulse:  (!) 51 (!) 53 (!) 56  Resp:  16 18 17   Temp:   97.7 F (36.5 C)   TempSrc:   Oral   SpO2:  97%  99%  Weight:      Height:        Intake/Output Summary (Last 24 hours) at 09/21/2017 0848 Last data filed at 09/20/2017 2047 Gross per 24 hour  Intake 1000 ml  Output -  Net 1000 ml   Filed Weights   09/20/17 1617  Weight: 83.9 kg   Body mass index is 28.13 kg/m.  General:  Well nourished, well developed, in no acute distress HEENT: normal Lymph: no adenopathy Neck: no JVD Endocrine:  No thryomegaly Vascular: No carotid bruits; FA pulses 2+ bilaterally without bruits  Cardiac:  normal S1, S2; RRR; no murmur  Lungs:  clear to auscultation bilaterally, no wheezing, rhonchi or rales  Abd: soft, nontender, no hepatomegaly  Ext: no edema Musculoskeletal:  No deformities, BUE and BLE strength normal and equal Skin: warm and dry  Neuro:  CNs 2-12 intact, no focal abnormalities noted Psych:  Normal affect   EKG:  The EKG was personally reviewed and demonstrates:  Sinus bradycardia 50 bpm Telemetry:  Telemetry was personally reviewed and demonstrates:  Sinus bradycardia, 50 bpm. Run of atrial tach on tele.  Relevant CV Studies: [facility] 2012  IMPRESSION: 1. Triple-vessel coronary artery disease. 2.  Patent stents in the mid left anterior descending artery and in the     proximal portion of the second obtuse marginal branch of the     circumflex artery. 3. Moderate nonobstructive disease in the diagonal branch, the     circumflex artery and its distal portion, and the right coronary     artery in the midportion. 4. Normal left ventricular systolic function.  RECOMMENDATIONS:  This patient has multiple areas, which could be causing chest pain and could be causing ischemia.  This could be related to the severe disease in the distal portion of the circumflex artery that involved very small-caliber branches.  This also could be secondary to the moderate stenosis in the diagonal branch.  At this time, I would recommend medical management.  If we are unable to control his chest pain with medical management, then I would recommend a nuclear stress test to try to define any areas of ischemia that could be addressed possibly with percutaneous intervention.  Laboratory Data:  Chemistry Recent Labs  Lab 09/20/17 1625 09/21/17 0709  NA 140 138  K 4.3 3.8  CL 102  104  CO2 29 27  GLUCOSE 192* 160*  BUN 13 13  CREATININE 1.31* 1.32*  CALCIUM 9.5 8.9  GFRNONAA 54* 54*  GFRAA >60 >60  ANIONGAP 9 7    Recent Labs  Lab 09/20/17 1625  PROT 7.4  ALBUMIN 4.4  AST 25  ALT 24  ALKPHOS 54  BILITOT 0.8   Hematology Recent Labs  Lab 09/20/17 1625  WBC 4.3  RBC 4.84  HGB 13.3  HCT 41.6  MCV 86.0  MCH 27.5  MCHC 32.0  RDW 12.5  PLT 189   Cardiac Enzymes Recent Labs  Lab 09/21/17 0200 09/21/17 0709  TROPONINI <0.03 <0.03    Recent Labs  Lab 09/20/17 2006  TROPIPOC 0.01    BNPNo results for input(s): BNP, PROBNP in the last 168 hours.  DDimer No results for input(s): DDIMER in the last 168 hours.  Radiology/Studies:  Dg Chest 2 View  Result Date: 09/20/2017 CLINICAL DATA:  Dizziness, nausea and vomiting. EXAM: CHEST - 2 VIEW COMPARISON:  07/15/2015 FINDINGS: The  heart size and mediastinal contours are within normal limits. Status post CABG. Mild aortic atherosclerosis at the arch without aneurysmal dilatation. Both lungs are clear. The visualized skeletal structures are unremarkable. IMPRESSION: No active cardiopulmonary disease. Aortic atherosclerosis (ICD10-I70.0) Electronically Signed   By: Ashley Royalty M.D.   On: 09/20/2017 22:55   Ct Abdomen Pelvis W Contrast  Result Date: 09/20/2017 CLINICAL DATA:  Generalized abdominal pain EXAM: CT ABDOMEN AND PELVIS WITH CONTRAST TECHNIQUE: Multidetector CT imaging of the abdomen and pelvis was performed using the standard protocol following bolus administration of intravenous contrast. CONTRAST:  146mL ISOVUE-300 IOPAMIDOL (ISOVUE-300) INJECTION 61% COMPARISON:  08/31/2017 FINDINGS: Lower chest: Dependent atelectasis.  No acute findings. Hepatobiliary: Small gallstones layering within the gallbladder. No focal hepatic abnormality. Suspect mild fatty infiltration. Pancreas: No focal abnormality or ductal dilatation. Spleen: No focal abnormality.  Normal size. Adrenals/Urinary Tract: No adrenal abnormality. No focal renal abnormality. No stones or hydronephrosis. Urinary bladder is unremarkable. Stomach/Bowel: Normal appendix. Sigmoid diverticulosis. No active diverticulitis. Stomach and small bowel decompressed. Vascular/Lymphatic: Aortic atherosclerosis. No enlarged abdominal or pelvic lymph nodes. Reproductive: Prostate enlargement.  Central calcifications. Other: No free fluid or free air. Bilateral inguinal hernias containing fat. Musculoskeletal: No acute bony abnormality. IMPRESSION: Mild fatty infiltration of the liver. Small layering gallstones. Sigmoid diverticulosis.  No active diverticulitis. Prostate enlargement. Bilateral inguinal hernias containing fat. Aortic atherosclerosis. Electronically Signed   By: Rolm Baptise M.D.   On: 09/20/2017 21:13   US Abdomen Limited  Result Date: 09/20/2017 CLINICAL DATA:   67 year old male with increased abdominal pain. EXAM: ULTRASOUND ABDOMEN LIMITED RIGHT UPPER QUADRANT COMPARISON:  CT of abdomen pelvis dated 09/20/2017 FINDINGS: Gallbladder: The gallbladder is partially contracted. There are multiple stones within the gallbladder. There is no gallbladder wall thickening or pericholecystic fluid. Negative sonographic Murphy's sign. Common bile duct: Diameter: 4 mm Liver: Diffuse increased liver echogenicity consistent with fatty infiltration. Portal vein is patent on color Doppler imaging with normal direction of blood flow towards the liver. IMPRESSION: 1. Cholelithiasis without sonographic evidence acute cholecystitis. 2. Fatty liver. Electronically Signed   By: Anner Crete M.D.   On: 09/20/2017 23:43    Assessment and Plan:   Gregory Friedman is a 67 y.o. male with a hx of CAD, HTN, HLD, T2DM, buccal squamous cell carcinoma s/p resection in 2014, and h/o diverticulitis who is being seen today for the evaluation of chest pain at the request of Dr. Eliseo Squires, Internal Medicine.  1. Chest Pain: pt has known CAD as outlined above, however recent CP was atypical and unlike previous angina prior to undergoing CABG in 2005. Yesterday's CP was also associated with GI symptoms (adominal pain and diarrhea after eating a meal). His EKG is nonischemic and troponin's are negative x 2. He is currently CP free and no further GI symptoms. At this point, there is no urgent need for further ischemic evaluation as an inpatient. I think it may be reasonable to continue w/u as an outpatient. He has an upcomming appt w/ Dr. Harrington Challenger in 2 weeks on 9/16.   2. CAD: per above. Continue medical therapy.   3. ? Tachycardia   Pt had reported tachycardia earlier   Artifact from patient motion   SB   NO monitor needed    4. HTN: Fair control  140/70 this morning. Continue home regimen.   6. HLD: last lipid panel showed controlled LDL at 67 mg/dL. Continue statin therapy.   7. DM: per primary  team.   8. GI: acute symptoms resolved. CT abd here showed only gallstone and limitied US showed gallstones w/o acute cholecystitis.  MD to follow with further recommendations.    For questions or updates, please contact [facility] Please consult [email or web address] for contact info under Cardiology/STEMI.   Signed, Lyda Jester, PA-C  09/21/2017 8:48 AM   Pt seen and examined   I have amended note above to reflect my findinngs Pt is familiar to me from clnic  HE has a histroy of CAD     I do not think current episodes represent angina   Probable GI in orign  Gut may be irritable from recent flare  I would keep on current regmien   No tachycardia noted   HR slower but he is tolerating   Stop IV fluids OK to d/c from cardiac standpoint He has f/u with me in several weeks.    Dorris Carnes

## 2017-09-21 NOTE — Discharge Summary (Signed)
Physician Discharge Summary  Gabriel Conry DJM:[id number] DOB: 30-Nov-1950 DOA: 09/20/2017  PCP: [name], MD  Admit date: 09/20/2017 Discharge date: 09/21/2017  Time spent: 65 minutes  Recommendations for Outpatient Follow-up:  1. Follow up with dr Harrington Challenger as schedule   Discharge Diagnoses:  Principal Problem:   Chest pain Active Problems:   HYPERTENSION, BENIGN   Coronary atherosclerosis   Dizziness   Type 2 diabetes mellitus not at goal [facility])   Diabetes mellitus, without long-term current use of insulin (HCC)   S/P CABG (coronary artery bypass graft)   Abdominal pain   Nausea & vomiting   History of head and neck cancer   Discharge Condition: stable  Diet recommendation: heart healthy carb modified  Filed Weights   09/20/17 1617  Weight: 83.9 kg    History of present illness:  Ryker Sudbury is a 67 y.o. 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 Had diverticulitis several wks ago treated w/ po abx.  CT abdomen done by ED showed no acute changes, except there were some small layering gallstones but no RUQ tenderness.  EKG showed NSR , no sig ST ^ or down.    Hospital Course:  Assessment: 1. Chest pain - non-ischemic EKG, trop neg x 2. Pt with known CAD. Resolved on day of discharge. Evaluated by cardiology who opined likely related to GI and no urgent need for further ischemic evaluation as inpatient. He has apt with Dr Harrington Challenger 10/04/2017 2. Abd pain/ vomiting/ dizziness - Resolved at discharge.  CT abd neg except for gallstones - abd [place] does not show GB infection.  3. CKD stage II - baseline creat 1.2- 1.5, creat at baseline 4. HTN - on 3 bp meds. Fair control during hospitalization 5. CAD hx CABG. No further pain. See #1. Continue medical therapy per cards recommendation 6. H/o head and  neck Ca - treated in 2014 w/ surgery + other 7. DM2 - non insulin dependent;  holding metformin until 09/22/2017 due to IV contrast for abd CT.     Procedures:  See below  Consultations:  Dr Harrington Challenger cardiology  Discharge Exam: Vitals:   09/21/17 1230 09/21/17 1437  BP: 135/73 (!) 147/71  Pulse: (!) 51 (!) 50  Resp: 16 16  Temp: 98.4 F (36.9 C) 97.8 F (36.6 C)  SpO2: 96% 96%    General: alert and oriented. Well nourished. No acute distress Cardiovascular: RRR no MGR no LE edema Respiratory: normal effort BS clear bilaterally no wheeze  Discharge Instructions   Discharge Instructions    Call MD for:   Complete by:  As directed    Call MD for:  difficulty breathing, headache or visual disturbances   Complete by:  As directed    Call MD for:  persistant nausea and vomiting   Complete by:  As directed    Call MD for:  severe uncontrolled pain   Complete by:  As directed    Call MD for:  temperature >100.4   Complete by:  As directed    Diet - low sodium heart healthy   Complete by:  As directed    Discharge instructions   Complete by:  As directed    Follow up with PCP 1-2 weeks regarding abdominal pain/chest pain.   Increase activity slowly   Complete by:  As directed  Allergies as of 09/21/2017   No Known Allergies     Medication List    STOP taking these medications   ciprofloxacin 500 MG tablet Commonly known as:  CIPRO     TAKE these medications   antiseptic oral rinse Liqd 15 mLs by Mouth Rinse route daily as needed for dry mouth.   aspirin EC 81 MG tablet Take 81 mg by mouth daily.   co-enzyme Q-10 30 MG capsule Take 30 mg by mouth daily.   enalapril 10 MG tablet Commonly known as:  VASOTEC Take 1 tablet (10 mg total) by mouth daily.   fluticasone 50 MCG/ACT nasal spray Commonly known as:  FLONASE Place 1-2 sprays into both nostrils at bedtime.   glucose blood test strip Use as instructed   HYDROcodone-acetaminophen 5-325 MG  tablet Commonly known as:  NORCO/VICODIN Take 1 tablet by mouth every 6 (six) hours as needed for severe pain.   Lancets Misc Please test blood sugars daily   metFORMIN 500 MG 24 hr tablet Commonly known as:  GLUCOPHAGE-XR TAKE 2 TABLETS BY MOUTH  DAILY WITH BREAKFAST What changed:  See the new instructions.   metoprolol succinate 25 MG 24 hr tablet Commonly known as:  TOPROL-XL Take 2 tablets (50 mg total) by mouth daily.   multivitamin with minerals tablet Take 1 tablet by mouth 2 (two) times daily.   naproxen 500 MG tablet Commonly known as:  NAPROSYN Take 1 tablet (500 mg total) by mouth 2 (two) times daily as needed for mild pain, moderate pain or headache (TAKE WITH MEALS.).   nitroGLYCERIN 0.4 MG SL tablet Commonly known as:  NITROSTAT Place 1 tablet (0.4 mg total) under the tongue every 5 (five) minutes as needed. Chest pain   omeprazole 40 MG capsule Commonly known as:  PRILOSEC TAKE 1 CAPSULE BY MOUTH  DAILY   ondansetron 4 MG disintegrating tablet Commonly known as:  ZOFRAN-ODT Take 1 tablet (4 mg total) by mouth every 8 (eight) hours as needed for nausea or vomiting.   Saw Palmetto (Serenoa repens) 320 MG Caps Take 1 capsule by mouth daily.   simvastatin 40 MG tablet Commonly known as:  ZOCOR TAKE 1 TABLET BY MOUTH AT  BEDTIME      No Known Allergies    The results of significant diagnostics from this hospitalization (including imaging, microbiology, ancillary and laboratory) are listed below for reference.    Significant Diagnostic Studies: Dg Chest 2 View  Result Date: 09/20/2017 CLINICAL DATA:  Dizziness, nausea and vomiting. EXAM: CHEST - 2 VIEW COMPARISON:  07/15/2015 FINDINGS: The heart size and mediastinal contours are within normal limits. Status post CABG. Mild aortic atherosclerosis at the arch without aneurysmal dilatation. Both lungs are clear. The visualized skeletal structures are unremarkable. IMPRESSION: No active cardiopulmonary  disease. Aortic atherosclerosis (ICD10-I70.0) Electronically Signed   By: Ashley Royalty M.D.   On: 09/20/2017 22:55   Ct Abdomen Pelvis W Contrast  Result Date: 09/20/2017 CLINICAL DATA:  Generalized abdominal pain EXAM: CT ABDOMEN AND PELVIS WITH CONTRAST TECHNIQUE: Multidetector CT imaging of the abdomen and pelvis was performed using the standard protocol following bolus administration of intravenous contrast. CONTRAST:  152mL ISOVUE-300 IOPAMIDOL (ISOVUE-300) INJECTION 61% COMPARISON:  08/31/2017 FINDINGS: Lower chest: Dependent atelectasis.  No acute findings. Hepatobiliary: Small gallstones layering within the gallbladder. No focal hepatic abnormality. Suspect mild fatty infiltration. Pancreas: No focal abnormality or ductal dilatation. Spleen: No focal abnormality.  Normal size. Adrenals/Urinary Tract: No adrenal abnormality. No focal renal abnormality. No  stones or hydronephrosis. Urinary bladder is unremarkable. Stomach/Bowel: Normal appendix. Sigmoid diverticulosis. No active diverticulitis. Stomach and small bowel decompressed. Vascular/Lymphatic: Aortic atherosclerosis. No enlarged abdominal or pelvic lymph nodes. Reproductive: Prostate enlargement.  Central calcifications. Other: No free fluid or free air. Bilateral inguinal hernias containing fat. Musculoskeletal: No acute bony abnormality. IMPRESSION: Mild fatty infiltration of the liver. Small layering gallstones. Sigmoid diverticulosis.  No active diverticulitis. Prostate enlargement. Bilateral inguinal hernias containing fat. Aortic atherosclerosis. Electronically Signed   By: Rolm Baptise M.D.   On: 09/20/2017 21:13   Ct Abdomen Pelvis W Contrast  Result Date: 08/31/2017 CLINICAL DATA:  Diffuse abdominal pain. Evaluate for diverticulitis. History of head neck cancer. History of prior gastrostomy tube. EXAM: CT ABDOMEN AND PELVIS WITH CONTRAST TECHNIQUE: Multidetector CT imaging of the abdomen and pelvis was performed using the standard  protocol following bolus administration of intravenous contrast. CONTRAST:  171mL OMNIPAQUE IOHEXOL 300 MG/ML  SOLN COMPARISON:  CT abdomen pelvis-01/20/2011; PET-CT-03/08/2012 FINDINGS: Lower chest: Limited visualization of lower thorax demonstrates minimal dependent subpleural ground-glass atelectasis. No discrete focal airspace opacities. No pleural effusion. Borderline cardiomegaly. Coronary artery calcifications. Post median sternotomy. No pericardial effusion. Hepatobiliary: Normal hepatic contour. No discrete hepatic lesions. Layering opacities within the gallbladder favored to represent gallstones. No associated gallbladder wall thickening or pericholecystic fluid. No intra or extrahepatic biliary ductal dilatation. No ascites. Pancreas: Normal appearance of the pancreas. Spleen: Normal appearance of the spleen Adrenals/Urinary Tract: There is symmetric enhancement and excretion of the bilateral kidneys. No definite renal stones on this postcontrast examination. No discrete renal lesions. No urine obstruction or perinephric stranding. Normal appearance of the bilateral adrenal glands. Normal appearance of the urinary bladder given degree of distention. Stomach/Bowel: There is ill-defined stranding centered about a prominent diverticuli arising from the posterolateral aspect of the mid descending colon with associated minimal amount of adjacent mesenteric stranding (axial image 38, series 3, coronal image 79, series 6), findings compatible with acute uncomplicated diverticulitis. No evidence of perforation or definable/drainable fluid collection. Moderate colonic stool burden without evidence of enteric obstruction. Normal appearance of the terminal ileum and appendix. No pneumoperitoneum, pneumatosis or portal venous gas. Tubular structure arising from the anterior inferior aspect of the greater curvature of the stomach likely represents scar tissue from patient's prior gastrostomy tube (representative  images 28 and 29, series 3). Vascular/Lymphatic: Atherosclerotic plaque with a normal caliber abdominal aorta. The major branch vessels of the abdominal aorta appear widely patent on this non CTA examination. No bulky retroperitoneal, mesenteric, pelvic or inguinal lymphadenopathy. Reproductive: Enlarged prostate with mass effect on the undersurface of the urinary bladder. Dystrophic calcifications within the prostate gland. No free fluid the pelvic cul-de-sac. Other: Small bilateral mesenteric fat containing inguinal hernias, left greater than right. Musculoskeletal: No acute or aggressive osseous abnormalities. Stigmata of DISH within the lower thoracic spine. Mild DDD L2-L3 with disc space height loss, endplate irregularity and anteriorly directed disc osteophyte complex at this location. IMPRESSION: 1. Acute uncomplicated diverticulitis involving the mid descending colon. No evidence of perforation or definable/drainable fluid collection. 2. Suspected cholelithiasis without evidence of cholecystitis. 3.  Aortic Atherosclerosis (ICD10-I70.0). 4. Prostatomegaly with mass effect on the undersurface of the urinary bladder. If not previously performed, further evaluation with DRE could be performed as indicated. Electronically Signed   By: Sandi Mariscal M.D.   On: 08/31/2017 15:27   US Abdomen Limited  Result Date: 09/20/2017 CLINICAL DATA:  67 year old male with increased abdominal pain. EXAM: ULTRASOUND ABDOMEN LIMITED RIGHT UPPER QUADRANT COMPARISON:  CT of abdomen pelvis dated 09/20/2017 FINDINGS: Gallbladder: The gallbladder is partially contracted. There are multiple stones within the gallbladder. There is no gallbladder wall thickening or pericholecystic fluid. Negative sonographic Murphy's sign. Common bile duct: Diameter: 4 mm Liver: Diffuse increased liver echogenicity consistent with fatty infiltration. Portal vein is patent on color Doppler imaging with normal direction of blood flow towards the liver.  IMPRESSION: 1. Cholelithiasis without sonographic evidence acute cholecystitis. 2. Fatty liver. Electronically Signed   By: Anner Crete M.D.   On: 09/20/2017 23:43    Microbiology: No results found for this or any previous visit (from the past 240 hour(s)).   Labs: Basic Metabolic Panel: Recent Labs  Lab 09/20/17 1625 09/21/17 0709  NA 140 138  K 4.3 3.8  CL 102 104  CO2 29 27  GLUCOSE 192* 160*  BUN 13 13  CREATININE 1.31* 1.32*  CALCIUM 9.5 8.9   Liver Function Tests: Recent Labs  Lab 09/20/17 1625  AST 25  ALT 24  ALKPHOS 54  BILITOT 0.8  PROT 7.4  ALBUMIN 4.4   Recent Labs  Lab 09/20/17 1625  LIPASE 59*   No results for input(s): AMMONIA in the last 168 hours. CBC: Recent Labs  Lab 09/20/17 1625  WBC 4.3  HGB 13.3  HCT 41.6  MCV 86.0  PLT 189   Cardiac Enzymes: Recent Labs  Lab 09/21/17 0200 09/21/17 0709  TROPONINI <0.03 <0.03   BNP: BNP (last 3 results) No results for input(s): BNP in the last 8760 hours.  ProBNP (last 3 results) No results for input(s): PROBNP in the last 8760 hours.  CBG: Recent Labs  Lab 09/21/17 0125 09/21/17 0805 09/21/17 1237  GLUCAP 103* 127* 171*       Signed:  Radene Gunning MD.  [facility] 09/21/2017, 2:45 PM

## 2017-09-21 NOTE — Plan of Care (Signed)
Will cont to monitor

## 2017-10-02 DIAGNOSIS — E1100 Type 2 diabetes mellitus with hyperosmolarity without nonketotic hyperglycemic-hyperosmolar coma (NKHHC): Secondary | ICD-10-CM | POA: Diagnosis not present

## 2017-10-02 DIAGNOSIS — E782 Mixed hyperlipidemia: Secondary | ICD-10-CM | POA: Diagnosis not present

## 2017-10-02 DIAGNOSIS — K5710 Diverticulosis of small intestine without perforation or abscess without bleeding: Secondary | ICD-10-CM | POA: Diagnosis not present

## 2017-10-02 DIAGNOSIS — I25700 Atherosclerosis of coronary artery bypass graft(s), unspecified, with unstable angina pectoris: Secondary | ICD-10-CM | POA: Diagnosis not present

## 2017-10-02 DIAGNOSIS — Z6830 Body mass index (BMI) 30.0-30.9, adult: Secondary | ICD-10-CM | POA: Diagnosis not present

## 2017-10-08 ENCOUNTER — Encounter: Payer: Self-pay | Admitting: Internal Medicine

## 2017-10-08 ENCOUNTER — Ambulatory Visit: Payer: Medicare HMO | Admitting: Internal Medicine

## 2017-10-08 VITALS — BP 138/80 | HR 63 | Ht 66.0 in | Wt 186.2 lb

## 2017-10-08 DIAGNOSIS — I10 Essential (primary) hypertension: Secondary | ICD-10-CM

## 2017-10-08 DIAGNOSIS — I2510 Atherosclerotic heart disease of native coronary artery without angina pectoris: Secondary | ICD-10-CM | POA: Diagnosis not present

## 2017-10-08 DIAGNOSIS — E782 Mixed hyperlipidemia: Secondary | ICD-10-CM | POA: Diagnosis not present

## 2017-10-08 NOTE — Progress Notes (Signed)
Cardiology Office Note   Date:  10/08/2017   ID:  Teren Zurcher, DOB 1950/01/24, MRN [id number]  PCP:  [name], MD  Cardiologist:   Dorris Carnes, MD    F/U of CAD      History of Present Illness: Raven Harmes is a 67 y.o. male with a history ofCAD s/p PCI and CABG (2005 in [place]), hypertension, type 2 diabetes, dyslipidemia, head and neck CA (s/p XRT/surg/chemo) and anxiety.   Cath in 2012 which showed LIMA to LAD atretic, SVG to OM occluded. There were multiple areas which could be causing chest pain or ischemia ( severe disease in distal LCx or mod disease in diag).   CP in June 2017  Myovue done   Old infarct   No ischemia   Event monitor in November *(pt had palpitations)  No arrhythmias Echo done LVEF 45 to 50% (unchanged)    I saw the pt in clinic in June 2019  Doing OK at time  On Aug 9 he was seen in ED for LLQ pain    On 8/29 he was admitted for chest pain  I saw him   I did not think the spells represent angina  Since seen he denies CP   Breathing is OK        Current Meds  Medication Sig  . antiseptic oral rinse (BIOTENE) LIQD 15 mLs by Mouth Rinse route daily as needed for dry mouth.   [name] aspirin EC 81 MG tablet Take 81 mg by mouth daily.  [name] co-enzyme Q-10 30 MG capsule Take 30 mg by mouth daily.  . enalapril (VASOTEC) 10 MG tablet Take 1 tablet (10 mg total) by mouth daily.  . fluticasone (FLONASE) 50 MCG/ACT nasal spray Place 1-2 sprays into both nostrils at bedtime.  [name] glucose blood test strip Use as instructed  . HYDROcodone-acetaminophen (NORCO) 5-325 MG tablet Take 1 tablet by mouth every 6 (six) hours as needed for severe pain.  . Lancets MISC Please test blood sugars daily  . metFORMIN (GLUCOPHAGE-XR) 500 MG 24 hr tablet TAKE 2 TABLETS BY MOUTH  DAILY WITH BREAKFAST (Patient taking differently: Take 500 mg by mouth 2 (two) times daily. )  . metoprolol succinate (TOPROL XL) 25 MG 24 hr tablet Take 2 tablets (50 mg total) by mouth  daily.  . Multiple Vitamins-Minerals (MULTIVITAMIN WITH MINERALS) tablet Take 1 tablet by mouth 2 (two) times daily.   . naproxen (NAPROSYN) 500 MG tablet Take 1 tablet (500 mg total) by mouth 2 (two) times daily as needed for mild pain, moderate pain or headache (TAKE WITH MEALS.).  [name] nitroGLYCERIN (NITROSTAT) 0.4 MG SL tablet Place 1 tablet (0.4 mg total) under the tongue every 5 (five) minutes as needed. Chest pain  . omeprazole (PRILOSEC) 40 MG capsule TAKE 1 CAPSULE BY MOUTH  DAILY  . ondansetron (ZOFRAN ODT) 4 MG disintegrating tablet Take 1 tablet (4 mg total) by mouth every 8 (eight) hours as needed for nausea or vomiting.  . Saw Palmetto, Serenoa repens, 320 MG CAPS Take 1 capsule by mouth daily.   . simvastatin (ZOCOR) 40 MG tablet TAKE 1 TABLET BY MOUTH AT  BEDTIME     Allergies:   Shellfish allergy   Past Medical History:  Diagnosis Date  . Anxiety   . BPH (benign prostatic hyperplasia)   . Cancer ([place]) 2014   neck area  . Colon polyps   . Coronary artery disease   . Depression   . Diverticulosis   .  GERD (gastroesophageal reflux disease)   . History of gout    toe  . 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 Hyperlipidemia   . Hypertension    states under control with med., has been on med. since 2003  . 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 Myocardial infarction ([place]) 2003  . Non-insulin dependent type 2 diabetes mellitus ([place])   . PONV (postoperative nausea and vomiting)   . S/P radiation therapy    Received 8 fractions, then stopped due to mucositis with Radiation/ Cisplatin  . Status post dilation of esophageal narrowing   . Trigger thumb of right hand 01/2016    Past Surgical History:  Procedure Laterality Date  . CARDIAC CATHETERIZATION  05/25/2010  . CARDIOVASCULAR STRESS TEST  07/2009  . COLONOSCOPY WITH PROPOFOL  02/16/2011  . CORONARY ANGIOPLASTY  2003; 2006   2  stents in heart  . CORONARY ARTERY BYPASS GRAFT  2005  . CYSTOSCOPY  02/09/2011   Procedure: CYSTOSCOPY;  Surgeon: Marissa Nestle, MD;  Location: [facility];  Service: Urology;  Laterality: N/A;  . ESOPHAGOGASTRODUODENOSCOPY (EGD) WITH ESOPHAGEAL DILATION  02/16/2011   with Propofol  . FOOT ARTHRODESIS, SUBTALAR Right 10/13/2009  . MASS EXCISION N/A 03/13/2012   Procedure: EXCISION LEFT BUCCAL MUCOSA;  Surgeon: Rozetta Nunnery, MD;  Location: [facility];  Service: ENT;  Laterality: N/A;  . RADICAL NECK DISSECTION Left 03/13/2012   Procedure: RADICAL NECK DISSECTION;  Surgeon: Rozetta Nunnery, MD;  Location: [facility];  Service: ENT;  Laterality: Left;  . TOOTH EXTRACTION N/A 03/13/2012   Procedure: EXTRACTION MOLARS;  Surgeon: Rozetta Nunnery, MD;  Location: [facility];  Service: ENT;  Laterality: N/A;  . TRIGGER FINGER RELEASE Right 02/24/2016   Procedure: RELEASE TRIGGER FINGER/A-1 PULLEY RIGHT THUMB;  Surgeon: Daryll Brod, MD;  Location: [facility];  Service: Orthopedics;  Laterality: Right;  FAB     Social History:  The patient  reports that he has never smoked. He has never used smokeless tobacco. He reports that he does not drink alcohol or use drugs.   Family History:  The patient's family history includes Asthma in his father; Diabetes in his brother, brother, and sister; Heart disease in his brother; Parkinson's disease in his brother; Seizures in his mother; Stroke in his sister; Thyroid cancer in his daughter.    ROS:  Please see the history of present illness. All other systems are reviewed and  Negative to the above problem except as noted.    PHYSICAL EXAM: VS:  BP 138/80   Pulse 63   Ht 5\' 6"  (1.676 m)   Wt 186 lb 3.2 oz (84.5 kg)   SpO2 97%   BMI 30.05 kg/m    BP on pt's wrist cuff 135/84   On office cuff 136/82 GEN: Well nourished, well developed, in no acute distress  HEENT: normal  Neck:  JVP is not eleated  No carotid bruits, or masses Cardiac: RRR;  no murmurs, rubs, or gallops,no edema  Respiratory:  clear to auscultation bilaterally, normal work of breathing GI: soft, nontender, nondistended, + BS  No hepatomegaly  MS: no deformity Moving all extremities   Skin: warm and dry, no rash Neuro:  Strength and sensation are intact Psych: euthymic mood, full affect   EKG:  EKG is not ordered today.   Lipid Panel    Component  Value Date/Time   CHOL 156 03/29/2017 1658   TRIG 216 (H) 03/29/2017 1658   HDL 46 03/29/2017 1658   CHOLHDL 3.4 03/29/2017 1658   CHOLHDL 2.6 04/19/2015 0001   VLDL 27 04/19/2015 0001   LDLCALC 67 03/29/2017 1658      Wt Readings from Last 3 Encounters:  10/08/17 186 lb 3.2 oz (84.5 kg)  09/20/17 185 lb (83.9 kg)  06/25/17 190 lb (86.2 kg)      ASSESSMENT AND PLAN:  1  CAD  No symptoms to sugg an  2  HTN BP is pretty good   I would not push furthe for now  Has occasional dizziness 3  HL Lipids were good in March  LDL 67  HDL 46    4  Thryoid Last TSH in March was normal  I would follow up next March         Current medicines are reviewed at length with the patient today.  The patient does not have concerns regarding medicines.  Signed, Dorris Carnes, MD  10/08/2017 3:24 PM    [facility] [address] [employer], [place], [place]  [address] Phone: [phone]; Fax: [phone]

## 2017-10-08 NOTE — Patient Instructions (Signed)
Your physician recommends that you continue on your current medications as directed. Please refer to the Current Medication list given to you today. Your physician wants you to follow-up in: 6 months with Dr. Ross.  You will receive a reminder letter in the mail two months in advance. If you don't receive a letter, please call our office to schedule the follow-up appointment.  

## 2017-10-10 ENCOUNTER — Encounter: Payer: Self-pay | Admitting: Internal Medicine

## 2017-10-10 DIAGNOSIS — R239 Unspecified skin changes: Secondary | ICD-10-CM | POA: Insufficient documentation

## 2017-10-10 DIAGNOSIS — E039 Hypothyroidism, unspecified: Secondary | ICD-10-CM | POA: Insufficient documentation

## 2017-10-10 DIAGNOSIS — Z79899 Other long term (current) drug therapy: Secondary | ICD-10-CM | POA: Insufficient documentation

## 2017-10-10 DIAGNOSIS — I700 Atherosclerosis of aorta: Secondary | ICD-10-CM | POA: Insufficient documentation

## 2017-10-23 ENCOUNTER — Ambulatory Visit (INDEPENDENT_AMBULATORY_CARE_PROVIDER_SITE_OTHER): Payer: Medicare HMO | Admitting: Internal Medicine

## 2017-10-23 ENCOUNTER — Encounter: Payer: Self-pay | Admitting: Internal Medicine

## 2017-10-23 VITALS — BP 118/78 | HR 58 | Temp 97.8°F | Wt 186.4 lb

## 2017-10-23 DIAGNOSIS — Z794 Long term (current) use of insulin: Secondary | ICD-10-CM

## 2017-10-23 DIAGNOSIS — E785 Hyperlipidemia, unspecified: Secondary | ICD-10-CM

## 2017-10-23 DIAGNOSIS — I10 Essential (primary) hypertension: Secondary | ICD-10-CM

## 2017-10-23 DIAGNOSIS — Z23 Encounter for immunization: Secondary | ICD-10-CM

## 2017-10-23 DIAGNOSIS — E119 Type 2 diabetes mellitus without complications: Secondary | ICD-10-CM

## 2017-10-23 DIAGNOSIS — E039 Hypothyroidism, unspecified: Secondary | ICD-10-CM

## 2017-10-23 NOTE — Progress Notes (Signed)
Pt is here for medicare wellness visit and DM FU. States his average glucose is around 130's.

## 2017-10-23 NOTE — Progress Notes (Addendum)
Subjective:     Patient ID: Gregory Friedman, male   DOB: Nov 20, 1950, 67 y.o.   MRN: [id number]  Pt is here for Medicare Wellness visit.  Denies having any particular complaints today.    Pt is here for medicare wellness visit and DM FU. States his average glucose is around 130's.  Allergies  Allergen Reactions  . Shellfish Allergy Swelling    In toes   Current Outpatient Medications:  .  antiseptic oral rinse (BIOTENE) LIQD, 15 mLs by Mouth Rinse route daily as needed for dry mouth. , Disp: , Rfl:  .  aspirin EC 81 MG tablet, Take 81 mg by mouth daily., Disp: , Rfl:  .  co-enzyme Q-10 30 MG capsule, Take 30 mg by mouth daily., Disp: , Rfl:  .  enalapril (VASOTEC) 10 MG tablet, Take 1 tablet (10 mg total) by mouth daily., Disp: 90 tablet, Rfl: 1 .  fluticasone (FLONASE) 50 MCG/ACT nasal spray, Place 1-2 sprays into both nostrils at bedtime., Disp: , Rfl:  .  glucose blood test strip, Use as instructed, Disp: 100 each, Rfl: prn .  Lancets MISC, Please test blood sugars daily, Disp: 100 each, Rfl: prn .  levothyroxine (SYNTHROID, LEVOTHROID) 25 MCG tablet, Take 25 mcg by mouth daily before breakfast., Disp: , Rfl:  .  metFORMIN (GLUCOPHAGE-XR) 500 MG 24 hr tablet, TAKE 2 TABLETS BY MOUTH  DAILY WITH BREAKFAST (Patient taking differently: Take 500 mg by mouth 2 (two) times daily. ), Disp: 180 tablet, Rfl: 0 .  metoprolol succinate (TOPROL XL) 25 MG 24 hr tablet, Take 2 tablets (50 mg total) by mouth daily., Disp: 30 tablet, Rfl: 0 .  Multiple Vitamins-Minerals (MULTIVITAMIN WITH MINERALS) tablet, Take 1 tablet by mouth 2 (two) times daily. , Disp: , Rfl:  .  nitroGLYCERIN (NITROSTAT) 0.4 MG SL tablet, Place 1 tablet (0.4 mg total) under the tongue every 5 (five) minutes as needed. Chest pain, Disp: 25 tablet, Rfl: 3 .  omeprazole (PRILOSEC) 40 MG capsule, TAKE 1 CAPSULE BY MOUTH  DAILY, Disp: 90 capsule, Rfl: 0 .  ondansetron (ZOFRAN ODT) 4 MG disintegrating tablet, Take 1 tablet (4 mg  total) by mouth every 8 (eight) hours as needed for nausea or vomiting., Disp: 15 tablet, Rfl: 0 .  Saw Palmetto, Serenoa repens, 320 MG CAPS, Take 1 capsule by mouth daily. , Disp: , Rfl:  .  simvastatin (ZOCOR) 40 MG tablet, TAKE 1 TABLET BY MOUTH AT  BEDTIME, Disp: 90 tablet, Rfl: 0  Past Medical History:  Diagnosis Date  . Anxiety   . BPH (benign prostatic hyperplasia)   . Cancer ([place]) 2014   neck area  . Colon polyps   . Coronary artery disease   . Depression   . Diverticulosis   . GERD (gastroesophageal reflux disease)   . History of gout    toe  . 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 Hyperlipidemia   . Hypertension    states under control with med., has been on med. since 2003  . 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 Myocardial infarction ([place]) 2003  . Non-insulin dependent type 2 diabetes mellitus ([place])   . PONV (postoperative nausea and vomiting)   . S/P radiation therapy    Received 8 fractions, then stopped due to mucositis with Radiation/ Cisplatin  . Status post dilation of esophageal narrowing   . Trigger thumb  of right hand 01/2016  FAMILY HISTORY- unchanged Review of Systems  Constitutional: Negative.  Negative for activity change, appetite change, chills, diaphoresis, fatigue and fever.  HENT:       All negative except has slight decreased hearing from L ear since he had cancer surgery on her L neck  Eyes: Negative.   Respiratory: Negative for cough and shortness of breath.   Cardiovascular: Negative for palpitations and leg swelling.  Gastrointestinal: Negative.   Endocrine: Negative.   Genitourinary: Negative.  Negative for dysuria.  Musculoskeletal: Negative.  Negative for gait problem.  Skin: Negative.  Negative for rash.  Allergic/Immunologic: Negative.   Neurological: Negative for numbness.  Hematological: Negative.   Psychiatric/Behavioral: Negative.         Objective:   Physical Exam  Constitutional: He is oriented to person, place, and time. He appears well-developed and well-nourished.  ---------------------------------                 10/23/17                           1026           ---------------------------------  Weight: 186 lb 6.4 oz (84.6 kg) --------------------------------- ---------------------------------                 10/23/17                           1026           ---------------------------------  Weight: 186 lb 6.4 oz (84.6 kg) --------------------------------- ---------------------------              10/23/17                     1026        ---------------------------  BP:          118/78        Pulse:       (!) 58        Temp:   97.8 F (36.6 C)  SpO2:          97%        ---------------------------   HENT:  Head: Normocephalic.  Right Ear: External ear normal.  Left Ear: External ear normal.  Nose: Nose normal.  Eyes: Conjunctivae are normal. No scleral icterus.  Neck: Normal range of motion. Neck supple. No thyromegaly present.  No carotid bruits  Cardiovascular: Normal rate, regular rhythm, normal heart sounds and intact distal pulses.  No murmur heard. Pulmonary/Chest: Effort normal and breath sounds normal.  Musculoskeletal: Normal range of motion.  Lymphadenopathy:    He has no cervical adenopathy.  Neurological: He is alert and oriented to person, place, and time.  Skin: Skin is warm and dry.  Psychiatric: He has a normal mood and affect. His behavior is normal. Judgment and thought content normal.  Vitals reviewed.      Assessment:    1- DM type 2 controled- seen for Medicare wellness. UA neg 2- Hypothryoid- thyroid studies ordered 3- Hyperlipidemia- chronic  4- HTN- stable    Plan:    Labs ordered as noted and we will call him when the results come back. He will continue with current meds.'FU in 3 months.

## 2017-10-24 ENCOUNTER — Encounter: Payer: Self-pay | Admitting: Optometry

## 2017-10-24 LAB — CBC
Hematocrit: 42 % (ref 37.5–51.0)
Hemoglobin: 13.5 g/dL (ref 13.0–17.7)
MCH: 26.8 pg (ref 26.6–33.0)
MCHC: 32.1 g/dL (ref 31.5–35.7)
MCV: 84 fL (ref 79–97)
PLATELETS: 194 10*3/uL (ref 150–450)
RBC: 5.03 x10E6/uL (ref 4.14–5.80)
RDW: 13.1 % (ref 12.3–15.4)
WBC: 5.2 10*3/uL (ref 3.4–10.8)

## 2017-10-24 LAB — HEMOGLOBIN A1C
ESTIMATED AVERAGE GLUCOSE: 171 mg/dL
HEMOGLOBIN A1C: 7.6 % — AB (ref 4.8–5.6)

## 2017-10-24 LAB — CMP14 + ANION GAP
ALK PHOS: 61 IU/L (ref 39–117)
ALT: 26 IU/L (ref 0–44)
AST: 21 IU/L (ref 0–40)
Albumin/Globulin Ratio: 1.7 (ref 1.2–2.2)
Albumin: 4.8 g/dL (ref 3.6–4.8)
Anion Gap: 16 mmol/L (ref 10.0–18.0)
BILIRUBIN TOTAL: 0.3 mg/dL (ref 0.0–1.2)
BUN/Creatinine Ratio: 11 (ref 10–24)
BUN: 16 mg/dL (ref 8–27)
CHLORIDE: 98 mmol/L (ref 96–106)
CO2: 26 mmol/L (ref 20–29)
Calcium: 10 mg/dL (ref 8.6–10.2)
Creatinine, Ser: 1.52 mg/dL — ABNORMAL HIGH (ref 0.76–1.27)
GFR calc non Af Amer: 46 mL/min/{1.73_m2} — ABNORMAL LOW (ref >59)
GFR, EST AFRICAN AMERICAN: 54 mL/min/{1.73_m2} — AB (ref >59)
GLUCOSE: 149 mg/dL — AB (ref 65–99)
Globulin, Total: 2.9 g/dL (ref 1.5–4.5)
POTASSIUM: 4.5 mmol/L (ref 3.5–5.2)
Sodium: 140 mmol/L (ref 134–144)
TOTAL PROTEIN: 7.7 g/dL (ref 6.0–8.5)

## 2017-10-24 LAB — LIPID PANEL
CHOL/HDL RATIO: 3.3 ratio (ref 0.0–5.0)
Cholesterol, Total: 143 mg/dL (ref 100–199)
HDL: 44 mg/dL (ref >39)
LDL Calculated: 70 mg/dL (ref 0–99)
Triglycerides: 147 mg/dL (ref 0–149)
VLDL CHOLESTEROL CAL: 29 mg/dL (ref 5–40)

## 2017-10-24 LAB — T3, FREE: T3 FREE: 3.1 pg/mL (ref 2.0–4.4)

## 2017-10-24 LAB — T4, FREE: Free T4: 1.39 ng/dL (ref 0.82–1.77)

## 2017-10-24 LAB — TSH: TSH: 3.71 u[IU]/mL (ref 0.450–4.500)

## 2017-12-04 ENCOUNTER — Other Ambulatory Visit: Payer: Self-pay | Admitting: Internal Medicine

## 2017-12-04 DIAGNOSIS — E119 Type 2 diabetes mellitus without complications: Secondary | ICD-10-CM

## 2018-01-23 DIAGNOSIS — Z87820 Personal history of traumatic brain injury: Secondary | ICD-10-CM

## 2018-01-23 HISTORY — DX: Personal history of traumatic brain injury: Z87.820

## 2018-01-24 ENCOUNTER — Ambulatory Visit (INDEPENDENT_AMBULATORY_CARE_PROVIDER_SITE_OTHER): Payer: Medicare HMO | Admitting: Internal Medicine

## 2018-01-24 ENCOUNTER — Encounter: Payer: Self-pay | Admitting: Internal Medicine

## 2018-01-24 VITALS — BP 126/78 | HR 53 | Temp 97.9°F | Ht 66.0 in | Wt 188.8 lb

## 2018-01-24 DIAGNOSIS — E119 Type 2 diabetes mellitus without complications: Secondary | ICD-10-CM

## 2018-01-24 DIAGNOSIS — I10 Essential (primary) hypertension: Secondary | ICD-10-CM

## 2018-01-24 NOTE — Patient Instructions (Signed)
Informacin bsica sobre la diabetes  Diabetes Basics    La diabetes (diabetes mellitus) es una enfermedad de larga duracin (crnica). Se produce cuando el cuerpo no utiliza correctamente el azcar (glucosa) que se libera de los alimentos despus de comer.  La diabetes puede deberse a uno de estos problemas o a ambos:   El pncreas no produce suficiente cantidad de una hormona llamada insulina.   El cuerpo no reacciona de forma normal a la insulina que produce.  La insulina permite que ciertos azcares (glucosa) ingresen a las clulas del cuerpo. Esto le proporciona energa. Si tiene diabetes, los azcares no pueden ingresar a las clulas. Esto produce un aumento del nivel de azcar en la sangre (hiperglucemia).  Siga estas indicaciones en su casa:  Cmo se trata la diabetes?  Es posible que tenga que administrarse insulina u otros medicamentos para la diabetes todos los das para mantener el nivel de azcar en la sangre equilibrado. Adminstrese los medicamentos para la diabetes todos los das como se lo haya indicado el mdico. Haga una lista de los medicamentos para la diabetes aqu:  Medicamentos para la diabetes   Nombre del medicamento: ______________________________  ? Cantidad (dosis): ________________ Hora (a.m./p.m.): _______________ Notas: ___________________________________   Nombre del medicamento: ______________________________  ? Cantidad (dosis): ________________ Hora (a.m./p.m.): _______________ Notas: ___________________________________   Nombre del medicamento: ______________________________  ? Cantidad (dosis): ________________ Hora (a.m./p.m.): _______________ Notas: ___________________________________  Si usa insulina, aprender cmo aplicrsela con inyecciones. Es posible que deba ajustar la cantidad en funcin de los alimentos que coma. Haga una lista de los tipos de insulina que usa aqu:  Insulina   Tipo de insulina: ______________________________  ? Cantidad (dosis):  ________________ Hora (a.m./p.m.): _______________ Notas: ___________________________________   Tipo de insulina: ______________________________  ? Cantidad (dosis): ________________ Hora (a.m./p.m.): _______________ Notas: ___________________________________   Tipo de insulina: ______________________________  ? Cantidad (dosis): ________________ Hora (a.m./p.m.): _______________ Notas: ___________________________________   Tipo de insulina: ______________________________  ? Cantidad (dosis): ________________ Hora (a.m./p.m.): _______________ Notas: ___________________________________   Tipo de insulina: ______________________________  ? Cantidad (dosis): ________________ Hora (a.m./p.m.): _______________ Notas: ___________________________________  Cmo me controlo el nivel de azcar en la sangre?    Controle sus niveles de azcar en la sangre con un medidor de glucemia segn las indicaciones del mdico.  El mdico fijar los objetivos del tratamiento para usted. Generalmente, los resultados de los niveles de azcar en la sangre deben ser los siguientes:   Antes de las comidas (preprandial): de 80 a 130mg/dl (de 4,4 a 7,2mmol/l).   Despus de las comidas (posprandial): por debajo de 180mg/dl (10mmol/l).   Nivel de A1c: menos del 7%.  Anote las veces que se controlar los niveles de azcar en la sangre:  Controles de azcar en la sangre   Hora: _______________ Notas: ___________________________________   Hora: _______________ Notas: ___________________________________   Hora: _______________ Notas: ___________________________________   Hora: _______________ Notas: ___________________________________   Hora: _______________ Notas: ___________________________________   Hora: _______________ Notas: ___________________________________    Qu debo saber acerca del nivel bajo de azcar en la sangre?  Un nivel bajo de azcar en la sangre se denomina hipoglucemia. Este cuadro ocurre cuando el  nivel de azcar en la sangre es igual o menor que 70mg/dl (3,9mmol/l). Entre los sntomas, se pueden incluir los siguientes:   Sentir:  ? Hambre.  ? Preocupacin o nervios (ansiedad).  ? Sudoracin y piel hmeda.  ? Confusin.  ? Mareos.  ? Somnolencia.  ? Ganas de vomitar (nuseas).   Tener:  ?   Latidos cardacos acelerados.  ? Dolor de cabeza.  ? Cambios en la visin.  ? Hormigueo y falta de sensibilidad (entumecimiento) alrededor de la boca, los labios o la lengua.  ? Movimientos espasmdicos que no puede controlar (convulsiones).   Dificultades para hacer lo siguiente:  ? Moverse (coordinacin).  ? Dormir.  ? Desmayos.  ? Molestarse con facilidad (irritabilidad).  Tratamiento del nivel bajo de azcar en la sangre  Para tratar un nivel bajo de azcar en la sangre, ingiera un alimento o una bebida azucarada de inmediato. Si puede pensar con claridad y tragar de manera segura, siga la regla 15/15, que consiste en lo siguiente:   Consumir 15gramos de un hidrato de carbono de accin rpida (carbohidrato). Hable con su mdico acerca de cunto debera consumir.   Algunos hidratos de carbono de accin rpida son:  ? Comprimidos de azcar (pastillas de glucosa). Consuma 3o 4pastillas de glucosa.  ? De 6 a 8unidades de caramelos duros.  ? De 4 a 6onzas (de 120 a 150ml) de jugo de frutas.  ? De 4 a 6onzas (de 120 a 150ml) de refresco comn (no diettico).  ? 1 cucharada (15ml) de miel o azcar.   Contrlese el nivel de azcar en la sangre 15minutos despus de ingerir el hidrato de carbono.   Si el nivel de azcar en la sangre todava es igual o menor que 70mg/dl (3,9mmol/l), ingiera nuevamente 15gramos de un hidrato de carbono.   Si el nivel de azcar en la sangre no supera los 70mg/dl (3,9mmol/l) despus de 3intentos, solicite ayuda de inmediato.   Ingiera una comida o una colacin en el transcurso de 1hora despus de que el nivel de azcar en la sangre se haya normalizado.  Tratamiento del  nivel muy bajo de azcar en la sangre  Si el nivel de azcar en la sangre es igual o menor que 54mg/dl (3mmol/l), significa que est muy bajo (hipoglucemia grave). Esto es una emergencia. No espere a ver si los sntomas desaparecen. Solicite atencin mdica de inmediato. Comunquese con el servicio de emergencias de su localidad (911 en los [place]). No conduzca por sus propios medios hasta el hospital.  Preguntas para hacerle al mdico   Es necesario que me rena con un instructor en el cuidado de la diabetes?   Qu equipos necesitar para cuidarme en casa?   Qu medicamentos para la diabetes necesito? Cundo debo tomarlos?   Con qu frecuencia debo controlar mi nivel de azcar en la sangre?   A qu nmero puedo llamar si tengo preguntas?   Cundo es mi prxima cita con el mdico?   Dnde puedo encontrar un grupo de apoyo para las personas con diabetes?  Dnde buscar ms informacin   American Diabetes Association (Asociacin [place] de la Diabetes): www.diabetes.org   American Association of Diabetes Educators (Asociacin Estadounidense de Instructores para el Cuidado de la Diabetes): www.diabeteseducator.org/patient-resources  Comunquese con un mdico si:   El nivel de azcar en la sangre es igual o mayor que 240mg/dl (13,3mmol/dl) durante 2das seguidos.   Ha estado enfermo o ha tenido fiebre durante 2das o ms y no mejora.   Tiene alguno de estos problemas durante ms de 6horas:  ? No puede comer ni beber.  ? Siente malestar estomacal (nuseas).  ? Vomita.  ? Presenta heces lquidas (diarrea).  Solicite ayuda inmediatamente si:   El nivel de azcar en la sangre est por debajo de 54mg/dl (3mmol/l).   Se siente confundido.   Tiene dificultad para hacer   lo siguiente:  ? Pensar con claridad.  ? Respirar.  Resumen   La diabetes (diabetes mellitus) es una enfermedad de larga duracin (crnica). Se produce cuando el cuerpo no utiliza correctamente el azcar (glucosa)  que se libera de los alimentos despus de la digestin.   Aplquese la insulina y tome los medicamentos para la diabetes como se lo hayan indicado.   Contrlese el nivel de azcar en la sangre todos los das, con la frecuencia que le hayan indicado.   Concurra a todas las visitas de control como se lo haya indicado el mdico. Esto es importante.  Esta informacin no tiene como fin reemplazar el consejo del mdico. Asegrese de hacerle al mdico cualquier pregunta que tenga.  Document Released: 05/18/2017 Document Revised: 08/21/2017 Document Reviewed: 05/18/2017  Elsevier Interactive Patient Education  2019 [employer] Inc[employer]

## 2018-01-24 NOTE — Progress Notes (Signed)
Subjective:     Patient ID: Gregory Friedman , male    DOB: 1950/06/03 , 68 y.o.   MRN: [id number]   Chief Complaint  Patient presents with  . Diabetes    HPI  Pt is here for DM FU. He is frustrated due to his glucose being 130-140 fasting, and worse after eating up to 250-300 due to eating pastry. Does eat a cup of oat mean every day and bananas. Only on Sundays eat pancakes with syrup.  Has frequent yeast infections from Jard[facility] in the past. Trulicity caused trouble swallowing and does not want an injection.   Past Medical History:  Diagnosis Date  . Anxiety   . BPH (benign prostatic hyperplasia)   . Cancer ([place]) 2014   neck area  . Colon polyps   . Coronary artery disease   . Depression   . Diverticulosis   . GERD (gastroesophageal reflux disease)   . History of gout    toe  . 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 Hyperlipidemia   . Hypertension    states under control with med., has been on med. since 2003  . 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 Myocardial infarction ([place]) 2003  . Non-insulin dependent type 2 diabetes mellitus ([place])   . PONV (postoperative nausea and vomiting)   . S/P radiation therapy    Received 8 fractions, then stopped due to mucositis with Radiation/ Cisplatin  . Status post dilation of esophageal narrowing   . Trigger thumb of right hand 01/2016     Family History  Problem Relation Age of Onset  . Stroke Sister   . Diabetes Brother   . Seizures Mother   . Asthma Father   . Thyroid cancer Daughter   . Diabetes Sister   . Parkinson's disease Brother   . Heart disease Brother   . Diabetes Brother   . Colon cancer Neg Hx      Current Outpatient Medications:  .  antiseptic oral rinse (BIOTENE) LIQD, 15 mLs by Mouth Rinse route daily as needed for dry mouth. , Disp: , Rfl:  .  aspirin EC 81 MG tablet, Take 81 mg by mouth daily., Disp: , Rfl:  .   co-enzyme Q-10 30 MG capsule, Take 30 mg by mouth daily., Disp: , Rfl:  .  enalapril (VASOTEC) 10 MG tablet, Take 1 tablet (10 mg total) by mouth daily., Disp: 90 tablet, Rfl: 1 .  fluticasone (FLONASE) 50 MCG/ACT nasal spray, Place 1-2 sprays into both nostrils at bedtime., Disp: , Rfl:  .  glucose blood test strip, Use as instructed, Disp: 100 each, Rfl: prn .  Lancets MISC, Please test blood sugars daily, Disp: 100 each, Rfl: prn .  levothyroxine (SYNTHROID, LEVOTHROID) 25 MCG tablet, Take 25 mcg by mouth daily before breakfast., Disp: , Rfl:  .  metFORMIN (GLUCOPHAGE-XR) 500 MG 24 hr tablet, TAKE 1 TABLET TWICE DAILY, Disp: 180 tablet, Rfl: 0 .  metoprolol succinate (TOPROL XL) 25 MG 24 hr tablet, Take 2 tablets (50 mg total) by mouth daily., Disp: 30 tablet, Rfl: 0 .  Multiple Vitamins-Minerals (MULTIVITAMIN WITH MINERALS) tablet, Take 1 tablet by mouth 2 (two) times daily. , Disp: , Rfl:  .  nitroGLYCERIN (NITROSTAT) 0.4 MG SL tablet, Place 1 tablet (0.4 mg total) under the tongue every 5 (five) minutes as needed. Chest pain, Disp: 25 tablet, Rfl: 3 .  omeprazole (PRILOSEC) 40 MG capsule, TAKE 1 CAPSULE EVERY DAY BEFORE A MEAL, Disp: 90 capsule, Rfl: 0 .  Saw Palmetto, Serenoa repens, 320 MG CAPS, Take 1 capsule by mouth daily. , Disp: , Rfl:  .  simvastatin (ZOCOR) 40 MG tablet, TAKE 1 TABLET EVERY DAY IN THE EVENING, Disp: 90 tablet, Rfl: 0   Allergies  Allergen Reactions  . Shellfish Allergy Swelling    In toes     Review of Systems   Today's Vitals   01/24/18 1130  BP: 126/78  Pulse: (!) 53  Temp: 97.9 F (36.6 C)  TempSrc: Oral  SpO2: 95%  Weight: 188 lb 12.8 oz (85.6 kg)  Height: 5\' 6"  (1.676 m)   Body mass index is 30.47 kg/m.   Objective:  Physical Exam   Constitutional: She is oriented to person, place, and time. She appears well-developed and well-nourished. No distress.  HENT:  Head: Normocephalic and atraumatic.  Right Ear: External ear normal.  Left Ear:  External ear normal.  Nose: Nose normal.  Eyes: Conjunctivae are normal. Right eye exhibits no discharge. Left eye exhibits no discharge. No scleral icterus.  Neck: Neck supple. No thyromegaly present.  No carotid bruits bilaterally  Cardiovascular: Normal rate and regular rhythm.  No murmur heard. Pulmonary/Chest: Effort normal and breath sounds normal. No respiratory distress.  Musculoskeletal: Normal range of motion. She exhibits no edema.  Lymphadenopathy:    She has no cervical adenopathy.  Neurological: She is alert and oriented to person, place, and time.  Skin: Skin is warm and dry. Capillary refill takes less than 2 seconds. No rash noted. She is not diaphoretic.  Psychiatric: She has a normal mood and affect. Her behavior is normal. Judgment and thought content normal.  Nursing note reviewed.     Assessment And Plan:   1. Type 2 diabetes mellitus without complication, without long-term current use of insulin ([place]). He will come see a nutritionist for diet guidance. For now I reviewed his diet and advised him to only have 1/2 a banana a week, cut his oat meal to 1/2 cup portion, eat more berries. No med changes will be done yet, unless his labs are worse   - Hemoglobin A1c - Amb ref to Medical Nutrition Therapy-MNT  2. Essential hypertension- stable. May continue current meds. FU 3 months - CMP14 + Anion Gap - CBC no Diff    Melissa Pulido RODRIGUEZ-SOUTHWORTH, PA-C

## 2018-01-25 LAB — HEMOGLOBIN A1C
Est. average glucose Bld gHb Est-mCnc: 214 mg/dL
HEMOGLOBIN A1C: 9.1 % — AB (ref 4.8–5.6)

## 2018-01-25 LAB — CMP14 + ANION GAP
A/G RATIO: 1.7 (ref 1.2–2.2)
ALBUMIN: 4.7 g/dL (ref 3.6–4.8)
ALK PHOS: 52 IU/L (ref 39–117)
ALT: 55 IU/L — ABNORMAL HIGH (ref 0–44)
AST: 40 IU/L (ref 0–40)
Anion Gap: 18 mmol/L (ref 10.0–18.0)
BUN / CREAT RATIO: 8 — AB (ref 10–24)
BUN: 12 mg/dL (ref 8–27)
Bilirubin Total: 0.7 mg/dL (ref 0.0–1.2)
CHLORIDE: 97 mmol/L (ref 96–106)
CO2: 23 mmol/L (ref 20–29)
Calcium: 9.7 mg/dL (ref 8.6–10.2)
Creatinine, Ser: 1.54 mg/dL — ABNORMAL HIGH (ref 0.76–1.27)
GFR calc non Af Amer: 46 mL/min/{1.73_m2} — ABNORMAL LOW (ref >59)
GFR, EST AFRICAN AMERICAN: 53 mL/min/{1.73_m2} — AB (ref >59)
GLOBULIN, TOTAL: 2.8 g/dL (ref 1.5–4.5)
Glucose: 178 mg/dL — ABNORMAL HIGH (ref 65–99)
POTASSIUM: 4.5 mmol/L (ref 3.5–5.2)
SODIUM: 138 mmol/L (ref 134–144)
Total Protein: 7.5 g/dL (ref 6.0–8.5)

## 2018-01-25 LAB — CBC
Hematocrit: 40.1 % (ref 37.5–51.0)
Hemoglobin: 13.5 g/dL (ref 13.0–17.7)
MCH: 28.2 pg (ref 26.6–33.0)
MCHC: 33.7 g/dL (ref 31.5–35.7)
MCV: 84 fL (ref 79–97)
PLATELETS: 204 10*3/uL (ref 150–450)
RBC: 4.79 x10E6/uL (ref 4.14–5.80)
RDW: 13.3 % (ref 12.3–15.4)
WBC: 5.4 10*3/uL (ref 3.4–10.8)

## 2018-01-29 ENCOUNTER — Other Ambulatory Visit: Payer: Self-pay | Admitting: Internal Medicine

## 2018-01-29 MED ORDER — METFORMIN HCL ER (MOD) 1000 MG PO TB24: 1000.0000 mg | ORAL_TABLET | Freq: Two times a day (BID) | ORAL | 0 refills | Status: DC

## 2018-01-29 NOTE — Progress Notes (Signed)
Metformin dose was increased to 1000 mg bid, new rx sent

## 2018-02-05 ENCOUNTER — Other Ambulatory Visit: Payer: Self-pay | Admitting: Internal Medicine

## 2018-02-05 DIAGNOSIS — R399 Unspecified symptoms and signs involving the genitourinary system: Secondary | ICD-10-CM

## 2018-02-05 NOTE — Progress Notes (Signed)
BMP ordered to check renal function.

## 2018-02-06 LAB — BMP8+ANION GAP
Anion Gap: 17 mmol/L (ref 10.0–18.0)
BUN/Creatinine Ratio: 11 (ref 10–24)
BUN: 14 mg/dL (ref 8–27)
CALCIUM: 10.2 mg/dL (ref 8.6–10.2)
CO2: 24 mmol/L (ref 20–29)
Chloride: 98 mmol/L (ref 96–106)
Creatinine, Ser: 1.32 mg/dL — ABNORMAL HIGH (ref 0.76–1.27)
GFR calc Af Amer: 64 mL/min/{1.73_m2} (ref >59)
GFR calc non Af Amer: 55 mL/min/{1.73_m2} — ABNORMAL LOW (ref >59)
Glucose: 172 mg/dL — ABNORMAL HIGH (ref 65–99)
Potassium: 4.3 mmol/L (ref 3.5–5.2)
Sodium: 139 mmol/L (ref 134–144)

## 2018-02-07 ENCOUNTER — Ambulatory Visit (INDEPENDENT_AMBULATORY_CARE_PROVIDER_SITE_OTHER): Payer: Medicare HMO | Admitting: Internal Medicine

## 2018-02-07 ENCOUNTER — Other Ambulatory Visit: Payer: Self-pay | Admitting: Internal Medicine

## 2018-02-07 ENCOUNTER — Encounter: Payer: Self-pay | Admitting: Internal Medicine

## 2018-02-07 VITALS — BP 140/82 | HR 68 | Temp 98.0°F | Ht 66.0 in | Wt 188.0 lb

## 2018-02-07 DIAGNOSIS — F419 Anxiety disorder, unspecified: Secondary | ICD-10-CM | POA: Diagnosis not present

## 2018-02-07 DIAGNOSIS — E119 Type 2 diabetes mellitus without complications: Secondary | ICD-10-CM

## 2018-02-07 DIAGNOSIS — G4700 Insomnia, unspecified: Secondary | ICD-10-CM

## 2018-02-07 MED ORDER — BLOOD GLUCOSE TEST VI STRP: ORAL_STRIP | 2 refills | Status: DC

## 2018-02-07 MED ORDER — INSULIN DEGLUDEC 100 UNIT/ML ~~LOC~~ SOPN: PEN_INJECTOR | SUBCUTANEOUS | 0 refills | Status: DC

## 2018-02-07 MED ORDER — BLOOD GLUCOSE MONITOR KIT: PACK | 0 refills | Status: DC

## 2018-02-07 MED ORDER — SERTRALINE HCL 25 MG PO TABS: ORAL_TABLET | ORAL | 0 refills | Status: DC

## 2018-02-07 NOTE — Progress Notes (Addendum)
Subjective:     Patient ID: Gregory Friedman , male    DOB: 31-Oct-1950 , 68 y.o.   MRN: [id number]   Chief Complaint  Patient presents with  . Diabetes  . Anxiety    HPI  Pt is here for evaluation of depression and anxiety which he mentioned to the RD when she was dong some education about diabetic diet and trying to figure out what medications to place him. He is very stressed due to finances. Does not have support from his kids who live in another state. Denies suicide thoughts. He used to go to church but has stopped due to some disagreements. His appetite is decreased. Sleeps average 3h, has trouble falling asleep, and going back to sleep ones he wakes up. Has tried Melatonin 3 mg but gives him a hang over the next day. Has tried some off brand Lavander which has helped him rest. He is more interested in trying natural remedies than Tx. He also states that he never increased his Metformin to 1000 mg bid.   Past Medical History:  Diagnosis Date  . Anxiety   . BPH (benign prostatic hyperplasia)   . Cancer ([place]) 2014   neck area  . Colon polyps   . Coronary artery disease   . Depression   . Diverticulosis   . GERD (gastroesophageal reflux disease)   . History of gout    toe  . 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 Hyperlipidemia   . Hypertension    states under control with med., has been on med. since 2003  . 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 Myocardial infarction ([place]) 2003  . Non-insulin dependent type 2 diabetes mellitus ([place])   . PONV (postoperative nausea and vomiting)   . S/P radiation therapy    Received 8 fractions, then stopped due to mucositis with Radiation/ Cisplatin  . Status post dilation of esophageal narrowing   . Trigger thumb of right hand 01/2016     Family History  Problem Relation Age of Onset  . Stroke Sister   . Diabetes Brother   . Seizures Mother   .  Asthma Father   . Thyroid cancer Daughter   . Diabetes Sister   . Parkinson's disease Brother   . Heart disease Brother   . Diabetes Brother   . Colon cancer Neg Hx      Current Outpatient Medications:  .  antiseptic oral rinse (BIOTENE) LIQD, 15 mLs by Mouth Rinse route daily as needed for dry mouth. , Disp: , Rfl:  .  aspirin EC 81 MG tablet, Take 81 mg by mouth daily., Disp: , Rfl:  .  blood glucose meter kit and supplies KIT, Dispense based on patient and insurance preference. Use up to four times daily as directed. (FOR ICD-9 250.00, 250.01)., Disp: 1 each, Rfl: 0 .  co-enzyme Q-10 30 MG capsule, Take 30 mg by mouth daily., Disp: , Rfl:  .  enalapril (VASOTEC) 10 MG tablet, Take 1 tablet (10 mg total) by mouth daily., Disp: 90 tablet, Rfl: 1 .  fluticasone (FLONASE) 50 MCG/ACT nasal spray, Place 1-2 sprays into both nostrils at bedtime., Disp: , Rfl:  .  glucose blood test strip, Use as instructed, Disp: 100 each, Rfl: prn .  Lancets MISC, Please test blood sugars daily, Disp: 100 each, Rfl: prn .  metFORMIN (GLUMETZA) 1000 MG (MOD) 24  hr tablet, Take 1 tablet (1,000 mg total) by mouth 2 (two) times daily with a meal., Disp: 180 tablet, Rfl: 0 .  metoprolol succinate (TOPROL XL) 25 MG 24 hr tablet, Take 2 tablets (50 mg total) by mouth daily., Disp: 30 tablet, Rfl: 0 .  Multiple Vitamins-Minerals (MULTIVITAMIN WITH MINERALS) tablet, Take 1 tablet by mouth 2 (two) times daily. , Disp: , Rfl:  .  nitroGLYCERIN (NITROSTAT) 0.4 MG SL tablet, Place 1 tablet (0.4 mg total) under the tongue every 5 (five) minutes as needed. Chest pain, Disp: 25 tablet, Rfl: 3 .  omeprazole (PRILOSEC) 40 MG capsule, TAKE 1 CAPSULE EVERY DAY BEFORE A MEAL, Disp: 90 capsule, Rfl: 0 .  Saw Palmetto, Serenoa repens, 320 MG CAPS, Take 1 capsule by mouth daily. , Disp: , Rfl:  .  simvastatin (ZOCOR) 40 MG tablet, TAKE 1 TABLET EVERY DAY IN THE EVENING, Disp: 90 tablet, Rfl: 0 .  insulin degludec (TRESIBA FLEXTOUCH)  100 UNIT/ML SOPN FlexTouch Pen, 10 unites ones a day (Patient not taking: Reported on 02/07/2018), Disp: 3 pen, Rfl: 0 .  levothyroxine (SYNTHROID, LEVOTHROID) 25 MCG tablet, Take 25 mcg by mouth daily before breakfast., Disp: , Rfl:    Allergies  Allergen Reactions  . Shellfish Allergy Swelling    In toes     Review of Systems  Constitutional: Positive for appetite change.       Only eats 2 meals a day and does not eat much  Neurological: Negative for dizziness and headaches.  Psychiatric/Behavioral: Positive for decreased concentration and sleep disturbance. Negative for suicidal ideas. The patient is nervous/anxious.     Today's Vitals   02/07/18 1413  BP: 140/82  Pulse: 68  Temp: 98 F (36.7 C)  TempSrc: Oral  Weight: 188 lb (85.3 kg)  Height: _0  (1.676 m)   Body mass index is 30.34 kg/m.   Objective:  Physical Exam Vitals signs and nursing note reviewed.  Constitutional:      General: He is not in acute distress.    Appearance: Normal appearance. He is not toxic-appearing.  HENT:     Head: Normocephalic.     Right Ear: External ear normal.     Left Ear: External ear normal.     Nose: Nose normal.  Eyes:     Conjunctiva/sclera: Conjunctivae normal.  Neck:     Musculoskeletal: Neck supple. No neck rigidity.     Comments: No thyroidmegally Cardiovascular:     Rate and Rhythm: Normal rate and regular rhythm.     Heart sounds: No murmur.  Pulmonary:     Effort: Pulmonary effort is normal.     Breath sounds: Normal breath sounds.  Musculoskeletal: Normal range of motion.  Skin:    General: Skin is warm and dry.  Neurological:     Mental Status: He is alert and oriented to person, place, and time.     Gait: Gait normal.  Psychiatric:        Mood and Affect: Mood normal.        Thought Content: Thought content normal.        Judgment: Judgment normal.     Comments: Got a little tearful as we talked about different things    Assessment And Plan:    1.  Type 2 diabetes mellitus without complication, without long-term current use of insulin ([place])- chronic and in poor control. I referred him to C3 and C4 for financial and other kind of help they may be able  to give him.  He will come back for MA visit to be educated how to use [place].  I reviewed his last labs with him  2. Anxiety- he is willing to try Zoloft, so I started him 25 mg qhs. Fu in  2-3 weeks I also gave him samples of DoTerra Serenity capsules and Lavander to help with insomnia.     Itay Mella RODRIGUEZ-SOUTHWORTH, PA-C

## 2018-02-07 NOTE — Progress Notes (Signed)
Pt saw the RD on 02/06/18 for DM diet education and assessment of what medication he could be placed.  Her summary is as follows: Has been having poor sleep, only gets 3h per night, has low energy and decreased appetite, feels stressed and anxious. Enjoys gardening, mowing, music and painting. His fasting glucose on average has been 160's. Only eats 2 meals a day.  Had BMP done today and if Creat. Is worse, then he agreed to get on insulin and it was recommended to try [place] 10 U ones a day, which pt said he would rather, than GLP1, due to past allergic reaction to Januvia.  He asked to have me send an Accucheck glucose meter.

## 2018-02-07 NOTE — Progress Notes (Unsigned)
Please inform him that his kidney function is a little better, but is still in the abnormal range. Needs to stop the Metformin ones he starts the insulin. So he needs to come in for MA apt or with The Dietician that comes here for education to get a sample pen and be taught how to use it.

## 2018-02-15 ENCOUNTER — Encounter: Payer: Self-pay | Admitting: Internal Medicine

## 2018-02-18 ENCOUNTER — Other Ambulatory Visit: Payer: Self-pay | Admitting: Internal Medicine

## 2018-02-19 ENCOUNTER — Other Ambulatory Visit: Payer: Self-pay | Admitting: Internal Medicine

## 2018-02-19 ENCOUNTER — Encounter: Payer: Self-pay | Admitting: Internal Medicine

## 2018-02-19 MED ORDER — GLUCOSE BLOOD VI STRP: ORAL_STRIP | 12 refills | Status: DC

## 2018-02-19 MED ORDER — ACCU-CHEK SOFTCLIX LANCETS MISC: 12 refills | Status: DC

## 2018-02-19 NOTE — Progress Notes (Signed)
Glucometer supplies sent to [facility].

## 2018-02-21 ENCOUNTER — Other Ambulatory Visit: Payer: Self-pay

## 2018-02-21 DIAGNOSIS — E119 Type 2 diabetes mellitus without complications: Secondary | ICD-10-CM

## 2018-02-21 MED ORDER — GLUCOSE BLOOD VI STRP: ORAL_STRIP | 12 refills | Status: DC

## 2018-02-22 ENCOUNTER — Other Ambulatory Visit: Payer: Self-pay | Admitting: Internal Medicine

## 2018-02-22 DIAGNOSIS — E119 Type 2 diabetes mellitus without complications: Secondary | ICD-10-CM

## 2018-03-07 ENCOUNTER — Other Ambulatory Visit: Payer: Self-pay

## 2018-03-07 ENCOUNTER — Ambulatory Visit (INDEPENDENT_AMBULATORY_CARE_PROVIDER_SITE_OTHER): Payer: Medicare HMO | Admitting: Internal Medicine

## 2018-03-07 ENCOUNTER — Encounter: Payer: Self-pay | Admitting: Internal Medicine

## 2018-03-07 VITALS — BP 128/78 | HR 60 | Temp 97.9°F | Ht 67.8 in | Wt 190.4 lb

## 2018-03-07 DIAGNOSIS — R399 Unspecified symptoms and signs involving the genitourinary system: Secondary | ICD-10-CM | POA: Diagnosis not present

## 2018-03-07 DIAGNOSIS — R0789 Other chest pain: Secondary | ICD-10-CM

## 2018-03-07 DIAGNOSIS — Z7982 Long term (current) use of aspirin: Secondary | ICD-10-CM | POA: Diagnosis not present

## 2018-03-07 DIAGNOSIS — F5101 Primary insomnia: Secondary | ICD-10-CM | POA: Diagnosis not present

## 2018-03-07 DIAGNOSIS — F3289 Other specified depressive episodes: Secondary | ICD-10-CM | POA: Diagnosis not present

## 2018-03-07 DIAGNOSIS — E1165 Type 2 diabetes mellitus with hyperglycemia: Secondary | ICD-10-CM

## 2018-03-07 DIAGNOSIS — Z794 Long term (current) use of insulin: Secondary | ICD-10-CM | POA: Diagnosis not present

## 2018-03-07 MED ORDER — INSULIN DEGLUDEC 100 UNIT/ML ~~LOC~~ SOPN: PEN_INJECTOR | SUBCUTANEOUS | 0 refills | Status: DC

## 2018-03-07 MED ORDER — ACCU-CHEK AVIVA PLUS W/DEVICE KIT: 1.0000 | PACK | Freq: Two times a day (BID) | 1 refills | Status: DC

## 2018-03-07 NOTE — Progress Notes (Signed)
Subjective:     Patient ID: Gregory Friedman , male    DOB: 01-14-1951 , 68 y.o.   MRN: [id number]   Chief Complaint  Patient presents with  . Diabetes  . Dizziness    TODAY AFTER HE ATE/AFTER SHOWER/ LASTED 15 MINS    HPI  Pt is here today for FU 1- DM and been on 10 unites of tresiba per day, but his glucose are still high 179-300. Only close to 300 after dinner, and in the  200's fasting. Has a few readings in the 170's   2- Insomnia- is sleeping much better, the serenity  Essential oil capsules and lavender essential oil has helped him a lot.   3- Depression- has been taking the Zoloft prn this week, had been taking it qd since I saw him, but noticed he was getting sleepy in the afternoons and was disturbing his night time sleep. He has not heard from [facility]3 group.  4- pt states he woke up with pain all over  And this am felt weak and fatigued. He denies sweating, neck pain or chest pain. But had some type of discomfort in the center of his chest yesterday that was present for a few minutes, but could not describe it. He did not check his glucose since he was on his way out the house.  Has cardiologist apt 03/25/18  Past Medical History:  Diagnosis Date  . Anxiety   . BPH (benign prostatic hyperplasia)   . Cancer ([place]) 2014   neck area  . Colon polyps   . Coronary artery disease   . Depression   . Diverticulosis   . GERD (gastroesophageal reflux disease)   . History of gout    toe  . 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 Hyperlipidemia   . Hypertension    states under control with med., has been on med. since 2003  . 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 Myocardial infarction ([place]) 2003  . Non-insulin dependent type 2 diabetes mellitus ([place])   . PONV (postoperative nausea and vomiting)   . S/P radiation therapy    Received 8 fractions, then stopped due to mucositis with Radiation/  Cisplatin  . Status post dilation of esophageal narrowing   . Trigger thumb of right hand 01/2016     Family History  Problem Relation Age of Onset  . Stroke Sister   . Diabetes Brother   . Seizures Mother   . Asthma Father   . Thyroid cancer Daughter   . Diabetes Sister   . Parkinson's disease Brother   . Heart disease Brother   . Diabetes Brother   . Colon cancer Neg Hx      Current Outpatient Medications:  .  ACCU-CHEK FASTCLIX LANCETS MISC, CHECK BLOOD SUGAR ONE TIME DAILY AS DIRECTED, Disp: 150 each, Rfl: 2 .  ACCU-CHEK GUIDE test strip, CHECK BLOOD SUGAR ONE TIME DAILY, Disp: 100 each, Rfl: 12 .  ACCU-CHEK SOFTCLIX LANCETS lancets, Use as instructed, Disp: 100 each, Rfl: 12 .  antiseptic oral rinse (BIOTENE) LIQD, 15 mLs by Mouth Rinse route daily as needed for dry mouth. , Disp: , Rfl:  .  aspirin EC 81 MG tablet, Take 81 mg by mouth daily., Disp: , Rfl:  .  blood glucose meter kit and supplies KIT, Dispense based on patient and insurance preference. Use up to four times daily  as directed. (FOR ICD-9 250.00, 250.01)., Disp: 1 each, Rfl: 0 .  Blood Glucose Monitoring Suppl (ACCU-CHEK AVIVA PLUS) w/Device KIT, 1 Device by Does not apply route 2 (two) times daily after a meal., Disp: 1 kit, Rfl: 1 .  co-enzyme Q-10 30 MG capsule, Take 30 mg by mouth daily., Disp: , Rfl:  .  enalapril (VASOTEC) 10 MG tablet, Take 1 tablet (10 mg total) by mouth daily., Disp: 90 tablet, Rfl: 1 .  fluticasone (FLONASE) 50 MCG/ACT nasal spray, Place 1-2 sprays into both nostrils at bedtime., Disp: , Rfl:  .  insulin degludec (TRESIBA FLEXTOUCH) 100 UNIT/ML SOPN FlexTouch Pen, 15 unites ones a day, Disp: 1 pen, Rfl: 0 .  levothyroxine (SYNTHROID, LEVOTHROID) 25 MCG tablet, Take 25 mcg by mouth daily before breakfast., Disp: , Rfl:  .  metFORMIN (GLUMETZA) 1000 MG (MOD) 24 hr tablet, Take 1 tablet (1,000 mg total) by mouth 2 (two) times daily with a meal., Disp: 180 tablet, Rfl: 0 .  metoprolol  succinate (TOPROL XL) 25 MG 24 hr tablet, Take 2 tablets (50 mg total) by mouth daily., Disp: 30 tablet, Rfl: 0 .  Multiple Vitamins-Minerals (MULTIVITAMIN WITH MINERALS) tablet, Take 1 tablet by mouth 2 (two) times daily. , Disp: , Rfl:  .  nitroGLYCERIN (NITROSTAT) 0.4 MG SL tablet, Place 1 tablet (0.4 mg total) under the tongue every 5 (five) minutes as needed. Chest pain, Disp: 25 tablet, Rfl: 3 .  omeprazole (PRILOSEC) 40 MG capsule, TAKE 1 CAPSULE EVERY DAY BEFORE A MEAL, Disp: 90 capsule, Rfl: 0 .  Saw Palmetto, Serenoa repens, 320 MG CAPS, Take 1 capsule by mouth daily. , Disp: , Rfl:  .  sertraline (ZOLOFT) 25 MG tablet, One qhs for depression, Disp: 90 tablet, Rfl: 0 .  simvastatin (ZOCOR) 40 MG tablet, TAKE 1 TABLET EVERY DAY IN THE EVENING, Disp: 90 tablet, Rfl: 0   Allergies  Allergen Reactions  . Shellfish Allergy Swelling    In toes     Review of Systems  Constitutional: Negative for chills, diaphoresis and fever.  HENT: Negative for congestion, rhinorrhea and sinus pain.   Respiratory: Negative for cough, chest tightness and shortness of breath.   Cardiovascular: Positive for chest pain. Negative for palpitations and leg swelling.  Gastrointestinal: Negative for nausea and vomiting.  Genitourinary: Negative for difficulty urinating.       Has not had much nocturia the nights he has slept well.   Musculoskeletal: Negative for gait problem.  Skin: Negative for rash and wound.  Neurological: Negative for dizziness and headaches.  Psychiatric/Behavioral: Negative for sleep disturbance.    Today's Vitals   03/07/18 1445  BP: 128/78  Pulse: 60  Temp: 97.9 F (36.6 C)  TempSrc: Oral  SpO2: 98%  Weight: 190 lb 6.4 oz (86.4 kg)  Height: 5' 7.8" (1.722 m)   Body mass index is 29.12 kg/m.   Objective:  Physical Exam   Constitutional: She is oriented to person, place, and time. She appears well-developed and well-nourished. No distress.  HENT:  Head: Normocephalic  and atraumatic.  Right Ear: External ear normal.  Left Ear: External ear normal.  Nose: Nose normal.  Eyes: Conjunctivae are normal. Right eye exhibits no discharge. Left eye exhibits no discharge. No scleral icterus.  Neck: Neck supple. No thyromegaly present.  Cardiovascular: brady and regular rhythm.  No murmur heard. Pulmonary/Chest: Effort normal and breath sounds normal. No respiratory distress.  Musculoskeletal: Normal range of motion. She exhibits no edema.  Lymphadenopathy:  She has no cervical adenopathy.  Neurological: She is alert and oriented to person, place, and time.  Skin: Skin is warm and dry. Capillary refill takes less than 2 seconds. No rash noted. She is not diaphoretic.  Psychiatric: She has a normal mood and affect. Her behavior is normal. Judgment and thought content normal.  Nursing note reviewed.  EKG- unchanged from 11/2017 EKG.  Assessment And Plan:  1. Other chest pain- episodic and resolved right now. No action. May continue with Fu with cardiologist - EKG 12-Lead  2. Abnormal renal finding- following up on creatinine since off     Metformin.  - BMP8+Anion Gap  3. Primary insomnia- improved with essential oil trial. May continue this.   4. Other depression-slightly improved. He was advised to take the Zoloft in the evening and needs to take it qd.   5. Uncontrolled type 2 diabetes mellitus with hyperglycemia ([place])- I will have him increase his insulin to 15 U a day and he will continue with his glucose diaries. FU in 2 weeks.      Ira Busbin RODRIGUEZ-SOUTHWORTH, PA-C

## 2018-03-07 NOTE — Patient Instructions (Signed)
   INCREASE YOUR INSULIN TO 15 UNITS A DAY  TAKE THE ZOLOFT EVERY NIGHT  COME BACK IN 2 WEEKS.

## 2018-03-08 LAB — BMP8+ANION GAP
Anion Gap: 17 mmol/L (ref 10.0–18.0)
BUN/Creatinine Ratio: 12 (ref 10–24)
BUN: 17 mg/dL (ref 8–27)
CO2: 25 mmol/L (ref 20–29)
Calcium: 10.2 mg/dL (ref 8.6–10.2)
Chloride: 95 mmol/L — ABNORMAL LOW (ref 96–106)
Creatinine, Ser: 1.41 mg/dL — ABNORMAL HIGH (ref 0.76–1.27)
GFR calc Af Amer: 59 mL/min/{1.73_m2} — ABNORMAL LOW (ref >59)
GFR calc non Af Amer: 51 mL/min/{1.73_m2} — ABNORMAL LOW (ref >59)
Glucose: 320 mg/dL — ABNORMAL HIGH (ref 65–99)
POTASSIUM: 4.8 mmol/L (ref 3.5–5.2)
Sodium: 137 mmol/L (ref 134–144)

## 2018-03-21 ENCOUNTER — Ambulatory Visit (INDEPENDENT_AMBULATORY_CARE_PROVIDER_SITE_OTHER): Payer: Medicare HMO | Admitting: Internal Medicine

## 2018-03-21 ENCOUNTER — Other Ambulatory Visit: Payer: Self-pay

## 2018-03-21 ENCOUNTER — Encounter: Payer: Self-pay | Admitting: Internal Medicine

## 2018-03-21 ENCOUNTER — Ambulatory Visit: Payer: Medicare HMO | Admitting: Internal Medicine

## 2018-03-21 VITALS — BP 132/72 | HR 59 | Temp 97.8°F | Ht 67.8 in | Wt 193.0 lb

## 2018-03-21 DIAGNOSIS — Z87898 Personal history of other specified conditions: Secondary | ICD-10-CM

## 2018-03-21 DIAGNOSIS — E785 Hyperlipidemia, unspecified: Secondary | ICD-10-CM

## 2018-03-21 DIAGNOSIS — Z794 Long term (current) use of insulin: Secondary | ICD-10-CM | POA: Diagnosis not present

## 2018-03-21 DIAGNOSIS — E1165 Type 2 diabetes mellitus with hyperglycemia: Secondary | ICD-10-CM | POA: Diagnosis not present

## 2018-03-21 DIAGNOSIS — F329 Major depressive disorder, single episode, unspecified: Secondary | ICD-10-CM

## 2018-03-21 MED ORDER — INSULIN DEGLUDEC 100 UNIT/ML ~~LOC~~ SOPN: PEN_INJECTOR | SUBCUTANEOUS | 0 refills | Status: DC

## 2018-03-21 NOTE — Progress Notes (Signed)
Subjective:     Patient ID: Gregory Friedman , male    DOB: Mar 06, 1950 , 68 y.o.   MRN: [id number]   Chief Complaint  Patient presents with  . Diabetes    HPI  1- DM FU his glucose have been somewhere 179( fasting) -270. Last night post prandial glucose was 357.  2- Insomnia FU - improved a lot. Using the Zoloft at night time and lavender essential oil on the soles of his feet each night has helped. Has not had to try the Serenity capsules.   3- Depression FU - he feels better, is not feeling as hopeless or worried. Has not heard from C3 team. Today I introduced him to or CCM team RN.   Past Medical History:  Diagnosis Date  . Anxiety   . BPH (benign prostatic hyperplasia)   . Cancer ([place]) 2014   neck area  . Colon polyps   . Coronary artery disease   . Depression   . Diverticulosis   . GERD (gastroesophageal reflux disease)   . History of gout    toe  . 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 Hyperlipidemia   . Hypertension    states under control with med., has been on med. since 2003  . 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 Myocardial infarction ([place]) 2003  . Non-insulin dependent type 2 diabetes mellitus ([place])   . PONV (postoperative nausea and vomiting)   . S/P radiation therapy    Received 8 fractions, then stopped due to mucositis with Radiation/ Cisplatin  . Status post dilation of esophageal narrowing   . Trigger thumb of right hand 01/2016     Family History  Problem Relation Age of Onset  . Stroke Sister   . Diabetes Brother   . Seizures Mother   . Asthma Father   . Thyroid cancer Daughter   . Diabetes Sister   . Parkinson's disease Brother   . Heart disease Brother   . Diabetes Brother   . Colon cancer Neg Hx      Current Outpatient Medications:  .  ACCU-CHEK FASTCLIX LANCETS MISC, CHECK BLOOD SUGAR ONE TIME DAILY AS DIRECTED, Disp: 150 each, Rfl: 2 .   ACCU-CHEK GUIDE test strip, CHECK BLOOD SUGAR ONE TIME DAILY, Disp: 100 each, Rfl: 12 .  ACCU-CHEK SOFTCLIX LANCETS lancets, Use as instructed, Disp: 100 each, Rfl: 12 .  antiseptic oral rinse (BIOTENE) LIQD, 15 mLs by Mouth Rinse route daily as needed for dry mouth. , Disp: , Rfl:  .  aspirin EC 81 MG tablet, Take 81 mg by mouth daily., Disp: , Rfl:  .  blood glucose meter kit and supplies KIT, Dispense based on patient and insurance preference. Use up to four times daily as directed. (FOR ICD-9 250.00, 250.01)., Disp: 1 each, Rfl: 0 .  Blood Glucose Monitoring Suppl (ACCU-CHEK AVIVA PLUS) w/Device KIT, 1 Device by Does not apply route 2 (two) times daily after a meal., Disp: 1 kit, Rfl: 1 .  co-enzyme Q-10 30 MG capsule, Take 30 mg by mouth daily., Disp: , Rfl:  .  enalapril (VASOTEC) 10 MG tablet, Take 1 tablet (10 mg total) by mouth daily., Disp: 90 tablet, Rfl: 1 .  fluticasone (FLONASE) 50 MCG/ACT nasal spray, Place 1-2 sprays into both nostrils at bedtime., Disp: , Rfl:  .  insulin degludec (TRESIBA FLEXTOUCH) 100 UNIT/ML SOPN FlexTouch Pen,  15 unites ones a day, Disp: 1 pen, Rfl: 0 .  levothyroxine (SYNTHROID, LEVOTHROID) 25 MCG tablet, Take 25 mcg by mouth daily before breakfast., Disp: , Rfl:  .  metFORMIN (GLUMETZA) 1000 MG (MOD) 24 hr tablet, Take 1 tablet (1,000 mg total) by mouth 2 (two) times daily with a meal., Disp: 180 tablet, Rfl: 0 .  metoprolol succinate (TOPROL XL) 25 MG 24 hr tablet, Take 2 tablets (50 mg total) by mouth daily., Disp: 30 tablet, Rfl: 0 .  Multiple Vitamins-Minerals (MULTIVITAMIN WITH MINERALS) tablet, Take 1 tablet by mouth 2 (two) times daily. , Disp: , Rfl:  .  nitroGLYCERIN (NITROSTAT) 0.4 MG SL tablet, Place 1 tablet (0.4 mg total) under the tongue every 5 (five) minutes as needed. Chest pain, Disp: 25 tablet, Rfl: 3 .  omeprazole (PRILOSEC) 40 MG capsule, TAKE 1 CAPSULE EVERY DAY BEFORE A MEAL, Disp: 90 capsule, Rfl: 0 .  Saw Palmetto, Serenoa repens, 320  MG CAPS, Take 1 capsule by mouth daily. , Disp: , Rfl:  .  sertraline (ZOLOFT) 25 MG tablet, One qhs for depression, Disp: 90 tablet, Rfl: 0 .  simvastatin (ZOCOR) 40 MG tablet, TAKE 1 TABLET EVERY DAY IN THE EVENING, Disp: 90 tablet, Rfl: 0   Allergies  Allergen Reactions  . Shellfish Allergy Swelling    In toes     Review of Systems  Respiratory: Negative for chest tightness and shortness of breath.   Cardiovascular: Negative for chest pain, palpitations and leg swelling.  Gastrointestinal: Positive for constipation. Negative for diarrhea, nausea and vomiting.       Has had occasional constipation  Musculoskeletal: Negative for gait problem.       Locking L index finger is improving   Skin: Positive for rash.  Psychiatric/Behavioral: Negative for sleep disturbance. The patient is not nervous/anxious.        Denies depression     Today's Vitals   03/21/18 1420  BP: 132/72  Pulse: (!) 59  Temp: 97.8 F (36.6 C)  TempSrc: Oral  SpO2: 97%  Weight: 193 lb (87.5 kg)  Height: 5' 7.8" (1.722 m)   Body mass index is 29.52 kg/m.   Objective:  Physical Exam  Constitutional: She is oriented to person, place, and time. he appears well-developed and well-nourished. No distress.  HENT:  Head: Normocephalic and atraumatic.  Right Ear: External ear normal.  Left Ear: External ear normal.  Nose: Nose normal.  Eyes: Conjunctivae are normal. Right eye exhibits no discharge. Left eye exhibits no discharge. No scleral icterus.  Neck: Neck supple. No thyromegaly present.  No carotid bruits bilaterally  Cardiovascular: Normal rate and regular rhythm.  No murmur heard. Pulmonary/Chest: Effort normal and breath sounds normal. No respiratory distress.  Musculoskeletal: Normal range of motion. he exhibits no edema.  Lymphadenopathy: he has no cervical adenopathy.  Neurological: She is alert and oriented to person, place, and time.  Skin: Skin is warm and dry. Capillary refill takes less  than 2 seconds. No rash noted. She is not diaphoretic.  Psychiatric: She has a normal mood and affect. His behavior is normal, jokes a lot and likes to lough. Judgment and thought content normal.  Nursing note reviewed.  Assessment And Plan:     1. Uncontrolled type 2 diabetes mellitus with hyperglycemia ([place])    I will have him increase his insulin to 20 units.  - BMP8+Anion Gap - Referral to [facility] FU in 2 weeks.  2. Hyperlipidemia, unspecified hyperlipidemia type-  chronic. May continue current med.  - Lipid Profile  3- Depression- improved. I will have him do a PHQ9 next time. May continue Zoloft.  4- Insomnia- resolved. Will continue Lavender essential oil qhs on soles of his feet each night.   I spent 50% of the visit reviewing his glucose diary.   SYLVIA RODRIGUEZ-SOUTHWORTH, PA-C

## 2018-03-22 LAB — BMP8+ANION GAP
Anion Gap: 16 mmol/L (ref 10.0–18.0)
BUN/Creatinine Ratio: 9 — ABNORMAL LOW (ref 10–24)
BUN: 13 mg/dL (ref 8–27)
CO2: 24 mmol/L (ref 20–29)
Calcium: 9.3 mg/dL (ref 8.6–10.2)
Chloride: 100 mmol/L (ref 96–106)
Creatinine, Ser: 1.37 mg/dL — ABNORMAL HIGH (ref 0.76–1.27)
GFR calc non Af Amer: 53 mL/min/{1.73_m2} — ABNORMAL LOW (ref >59)
GFR, EST AFRICAN AMERICAN: 61 mL/min/{1.73_m2} (ref >59)
GLUCOSE: 268 mg/dL — AB (ref 65–99)
Potassium: 4.9 mmol/L (ref 3.5–5.2)
Sodium: 140 mmol/L (ref 134–144)

## 2018-03-22 LAB — LIPID PANEL
Chol/HDL Ratio: 4.1 ratio (ref 0.0–5.0)
Cholesterol, Total: 155 mg/dL (ref 100–199)
HDL: 38 mg/dL — ABNORMAL LOW (ref >39)
LDL Calculated: 61 mg/dL (ref 0–99)
Triglycerides: 279 mg/dL — ABNORMAL HIGH (ref 0–149)
VLDL Cholesterol Cal: 56 mg/dL — ABNORMAL HIGH (ref 5–40)

## 2018-03-24 NOTE — Progress Notes (Signed)
Cardiology Office Note:    Date:  03/25/2018   ID:  Gregory Friedman, DOB [birth year], MRN [id number]  PCP:  Gregory Mattocks, PA-C  Cardiologist:  Gregory Carnes, MD   Electrophysiologist:  None   Referring MD: Gregory Chard, MD   Chief Complaint  Patient presents with  . Follow-up    CAD     History of Present Illness:    Gregory Friedman is a 68 y.o. male with CAD s/p CABG in 2005 ([place]) and subsequent PCI with DES to LAD and OM2, hypertension, hyperlipidemia, diabetes, head and neck CA (prior radiation, surgery, chemotherapy).  He was last seen by Dr. Harrington Friedman in 09/2017.    Gregory Friedman returns for follow-up.  He is here alone.  He has recently been placed on insulin.  Since starting insulin, he has felt better.  He denies chest pain, shortness of breath, syncope, orthopnea, paroxysmal nocturnal dyspnea or lower extremity swelling.  Prior CV studies:   The following studies were reviewed today:  Echo 01/04/2016 EF 45-50, ant-sept AK, Gr 1 DD, trace MR  Event Monitor 12/22/15 Sinus rhythm  No arrhythmias detected  Myoview 07/15/15 IMPRESSION: 1. Apical infarct without definite peri-infarct ischemia. 2. Associated apical hypokinesis. 3. Left ventricular ejection fraction 47% 4. Non invasive risk stratification*: Intermediate  Cardiac Catheterization 05/25/10 LM dist 20 LAD prox, mid, dist 30; mid stent patent; Dx mid 60 LCx dist 100 (L-L collats); OM1 (small) 50; OM2 stent patent; OM3 70 at trifurcation of 3 sub-branches,  RCA mid 50; PLB diffuse 40 L-LAD atretic S-OM2 100 EF 55 Med Rx   Past Medical History:  Diagnosis Date  . Anxiety   . BPH (benign prostatic hyperplasia)   . Cancer ([place]) 2014   neck area  . Colon polyps   . Coronary artery disease   . Depression   . Diverticulosis   . GERD (gastroesophageal reflux disease)   . History of gout    toe  . 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 Hyperlipidemia     . Hypertension    states under control with med., has been on med. since 2003  . 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 Myocardial infarction ([place]) 2003  . Non-insulin dependent type 2 diabetes mellitus ([place])   . PONV (postoperative nausea and vomiting)   . S/P radiation therapy    Received 8 fractions, then stopped due to mucositis with Radiation/ Cisplatin  . Status post dilation of esophageal narrowing   . Trigger thumb of right hand 01/2016   Surgical Hx: The patient  has a past surgical history that includes Cardiovascular stress test (07/2009); Cystoscopy (02/09/2011); Radical neck dissection (Left, 03/13/2012); Tooth Extraction (N/A, 03/13/2012); Mass excision (N/A, 03/13/2012); Foot arthrodesis, subtalar (Right, 10/13/2009); Cardiac catheterization (05/25/2010); Coronary angioplasty (2003; 2006); Colonoscopy with propofol (02/16/2011); Esophagogastroduodenoscopy (egd) with esophageal dilation (02/16/2011); Coronary artery bypass graft (2005); and Trigger finger release (Right, 02/24/2016).   Current Medications: Current Meds  Medication Sig  . ACCU-CHEK FASTCLIX LANCETS MISC CHECK BLOOD SUGAR ONE TIME DAILY AS DIRECTED  . ACCU-CHEK SOFTCLIX LANCETS lancets Use as instructed  . antiseptic oral rinse (BIOTENE) LIQD 15 mLs by Mouth Rinse route daily as needed for dry mouth.   [name] aspirin EC 81 MG tablet Take 81 mg by mouth daily.  . blood glucose meter kit and supplies KIT Dispense based on patient and insurance preference. Use up  to four times daily as directed. (FOR ICD-9 250.00, 250.01).  . Blood Glucose Monitoring Suppl (ACCU-CHEK AVIVA PLUS) w/Device KIT 1 Device by Does not apply route 2 (two) times daily after a meal.  . co-enzyme Q-10 30 MG capsule Take 30 mg by mouth daily.  . enalapril (VASOTEC) 10 MG tablet Take 1 tablet (10 mg total) by mouth daily.  . fluticasone (FLONASE) 50 MCG/ACT nasal spray Place 1-2 sprays into  both nostrils at bedtime.  [name] glucose blood (ACCU-CHEK GUIDE) test strip 1 each by Other route 2 (two) times daily. Use as instructed  . insulin degludec (TRESIBA FLEXTOUCH) 100 UNIT/ML SOPN FlexTouch Pen 20  units ones a day  . levothyroxine (SYNTHROID, LEVOTHROID) 25 MCG tablet Take 25 mcg by mouth daily before breakfast.  . metFORMIN (GLUMETZA) 1000 MG (MOD) 24 hr tablet Take 1 tablet (1,000 mg total) by mouth 2 (two) times daily with a meal.  . metoprolol succinate (TOPROL XL) 25 MG 24 hr tablet Take 2 tablets (50 mg total) by mouth daily.  . Multiple Vitamins-Minerals (MULTIVITAMIN WITH MINERALS) tablet Take 1 tablet by mouth 2 (two) times daily.   . nitroGLYCERIN (NITROSTAT) 0.4 MG SL tablet Place 1 tablet (0.4 mg total) under the tongue every 5 (five) minutes as needed. Chest pain  . omeprazole (PRILOSEC) 40 MG capsule TAKE 1 CAPSULE EVERY DAY BEFORE A MEAL  . Saw Palmetto, Serenoa repens, 320 MG CAPS Take 1 capsule by mouth daily.   . sertraline (ZOLOFT) 25 MG tablet One qhs for depression  . simvastatin (ZOCOR) 40 MG tablet TAKE 1 TABLET EVERY DAY IN THE EVENING     Allergies:   Shellfish allergy   Social History   Tobacco Use  . Smoking status: Never Smoker  . Smokeless tobacco: Never Used  Substance Use Topics  . Alcohol use: No  . Drug use: No     Family Hx: The patient's family history includes Asthma in his father; Diabetes in his brother, brother, and sister; Heart disease in his brother; Parkinson's disease in his brother; Seizures in his mother; Stroke in his sister; Thyroid cancer in his daughter. There is no history of Colon cancer.  ROS:   Please see the history of present illness.    ROS All other systems reviewed and are negative.   EKGs/Labs/Other Test Reviewed:    EKG:  EKG is not ordered today.   ECG performed with primary care 03/07/2018 was personally reviewed-sinus bradycardia, heart rate 58, inferior Q waves, QTC 399, no change when compared to prior  tracing  Recent Labs: 10/23/2017: TSH 3.710 01/24/2018: ALT 55; Hemoglobin 13.5; Platelets 204 03/21/2018: BUN 13; Creatinine, Ser 1.37; Potassium 4.9; Sodium 140   Recent Lipid Panel Lab Results  Component Value Date/Time   CHOL 155 03/21/2018 03:10 PM   TRIG 279 (H) 03/21/2018 03:10 PM   HDL 38 (L) 03/21/2018 03:10 PM   CHOLHDL 4.1 03/21/2018 03:10 PM   CHOLHDL 2.6 04/19/2015 12:01 AM   LDLCALC 61 03/21/2018 03:10 PM   From [facility]     Physical Exam:    VS:  BP (!) 142/90   Pulse (!) 56   Ht 5' 7.8" (1.722 m)   Wt 192 lb (87.1 kg)   SpO2 98%   BMI 29.37 kg/m     Wt Readings from Last 3 Encounters:  03/25/18 192 lb (87.1 kg)  03/21/18 193 lb (87.5 kg)  03/07/18 190 lb 6.4 oz (86.4 kg)     Physical Exam  Constitutional: He is oriented to person, place, and time. He appears well-developed and well-nourished. No distress.  HENT:  Head: Normocephalic and atraumatic.  Neck: Neck supple. No JVD present.  Cardiovascular: Normal rate, regular rhythm, S1 normal and S2 normal.  No murmur heard. Pulmonary/Chest: Breath sounds normal. He has no rales.  Abdominal: Soft. There is no hepatomegaly.  Musculoskeletal:        General: No edema.  Neurological: He is alert and oriented to person, place, and time.  Skin: Skin is warm and dry.    ASSESSMENT & PLAN:    Coronary artery disease involving native coronary artery of native heart without angina pectoris History of CABG in 2005 and subsequent PCI with drug-eluting stent to the LAD and OM2.  Cardiac catheterization in 2012 demonstrated an atretic LIMA-LAD and occluded SVG-OM2.  Stents in the LAD and OM2 were patent at that time.  Nuclear stress test in 2017 demonstrated apical infarct but no definite peri-infarct ischemia.  He is doing well without angina.  Recent LDL was optimal.  Continue aspirin, statin therapy.  Essential hypertension Blood pressure is typically well controlled.  He notes that it does go up in the  clinic.  I have asked him to continue to monitor his blood pressures at home and notify us if they remain 140/90 or higher.  Atherosclerosis of aorta (HCC) Continue aspirin, statin.  Dispo:  Return in about 6 months (around 09/25/2018) for Routine Follow Up, w/ Dr. Harrington Friedman, or Richardson Dopp, PA-C.   Medication Adjustments/Labs and Tests Ordered: Current medicines are reviewed at length with the patient today.  Concerns regarding medicines are outlined above.  Tests Ordered: No orders of the defined types were placed in this encounter.  Medication Changes: No orders of the defined types were placed in this encounter.   Signed, Richardson Dopp, PA-C  03/25/2018 12:33 PM    [facility] [address], [place], [place]  [address] Phone: [phone]; Fax: [phone]

## 2018-03-25 ENCOUNTER — Encounter: Payer: Self-pay | Admitting: Physician Assistant

## 2018-03-25 ENCOUNTER — Other Ambulatory Visit: Payer: Self-pay

## 2018-03-25 ENCOUNTER — Ambulatory Visit: Payer: Medicare HMO | Admitting: Physician Assistant

## 2018-03-25 VITALS — BP 142/90 | HR 56 | Ht 67.8 in | Wt 192.0 lb

## 2018-03-25 DIAGNOSIS — I700 Atherosclerosis of aorta: Secondary | ICD-10-CM | POA: Diagnosis not present

## 2018-03-25 DIAGNOSIS — I2510 Atherosclerotic heart disease of native coronary artery without angina pectoris: Secondary | ICD-10-CM | POA: Diagnosis not present

## 2018-03-25 DIAGNOSIS — E119 Type 2 diabetes mellitus without complications: Secondary | ICD-10-CM

## 2018-03-25 DIAGNOSIS — I10 Essential (primary) hypertension: Secondary | ICD-10-CM

## 2018-03-25 MED ORDER — GLUCOSE BLOOD VI STRP: 1.0000 | ORAL_STRIP | Freq: Two times a day (BID) | 12 refills | Status: DC

## 2018-03-26 ENCOUNTER — Ambulatory Visit (INDEPENDENT_AMBULATORY_CARE_PROVIDER_SITE_OTHER): Payer: Medicare HMO

## 2018-03-26 DIAGNOSIS — E785 Hyperlipidemia, unspecified: Secondary | ICD-10-CM

## 2018-03-26 DIAGNOSIS — E1165 Type 2 diabetes mellitus with hyperglycemia: Secondary | ICD-10-CM | POA: Diagnosis not present

## 2018-03-26 DIAGNOSIS — F3289 Other specified depressive episodes: Secondary | ICD-10-CM | POA: Diagnosis not present

## 2018-03-26 DIAGNOSIS — F5101 Primary insomnia: Secondary | ICD-10-CM

## 2018-03-26 NOTE — Chronic Care Management (AMB) (Signed)
  Care Management Note   Gregory Friedman is a 68 y.o. year old male who is a primary care patient of [place], Sunday Spillers, [place]. The CM team was consult for assistance with chronic disease management and care coordination.   I reached out to [employer] by phone today.   [name] was given information about Chronic Care Management services today including:  1. CCM service includes personalized support from designated clinical staff supervised by his physician, including individualized plan of care and coordination with other care providers 2. 24/7 contact phone numbers for assistance for urgent and routine care needs. 3. Service will only be billed when office clinical staff spend 20 minutes or more in a month to coordinate care. 4. Only one practitioner may furnish and bill the service in a calendar month. 5. The patient may stop CCM services at any time (effective at the end of the month) by phone call to the office staff. 6. The patient will be responsible for cost sharing (co-pay) of up to 20% of the service fee (after annual deductible is met).  Patient agreed to services and verbal consent obtained.     Follow Up Plan: Face to Face appointment with CCM team member scheduled for: 04/11/18@ 3:00 PM   Barb Merino, RN,CCM Care Management Coordinator [place] [facility]  Direct Phone: [phone]

## 2018-03-26 NOTE — Patient Instructions (Signed)
Visit Information  Mr. Lanz was given information about Chronic Care Management services today including:  1. CCM service includes personalized support from designated clinical staff supervised by his physician, including individualized plan of care and coordination with other care providers 2. 24/7 contact phone numbers for assistance for urgent and routine care needs. 3. Service will only be billed when office clinical staff spend 20 minutes or more in a month to coordinate care. 4. Only one practitioner may furnish and bill the service in a calendar month. 5. The patient may stop CCM services at any time (effective at the end of the month) by phone call to the office staff. 6. The patient will be responsible for cost sharing (co-pay) of up to 20% of the service fee (after annual deductible is met).  Patient agreed to services and verbal consent obtained.   Face to Face appointment with CCM team member scheduled for:  04/11/18 @3 :00 PM    Barb Merino, RN,CCM Care Management Coordinator [place] [facility]  Direct Phone: [phone]

## 2018-04-04 ENCOUNTER — Encounter: Payer: Self-pay | Admitting: Internal Medicine

## 2018-04-04 ENCOUNTER — Ambulatory Visit (INDEPENDENT_AMBULATORY_CARE_PROVIDER_SITE_OTHER): Payer: Medicare HMO | Admitting: Internal Medicine

## 2018-04-04 ENCOUNTER — Other Ambulatory Visit: Payer: Self-pay

## 2018-04-04 ENCOUNTER — Ambulatory Visit: Payer: Medicare HMO | Admitting: Internal Medicine

## 2018-04-04 VITALS — BP 132/74 | HR 59 | Temp 98.2°F | Ht 66.2 in | Wt 193.8 lb

## 2018-04-04 DIAGNOSIS — Z794 Long term (current) use of insulin: Secondary | ICD-10-CM

## 2018-04-04 DIAGNOSIS — E1165 Type 2 diabetes mellitus with hyperglycemia: Secondary | ICD-10-CM

## 2018-04-04 DIAGNOSIS — R399 Unspecified symptoms and signs involving the genitourinary system: Secondary | ICD-10-CM

## 2018-04-04 MED ORDER — ICOSAPENT ETHYL 0.5 G PO CAPS: ORAL_CAPSULE | ORAL | 0 refills | Status: DC

## 2018-04-04 MED ORDER — INSULIN DEGLUDEC 100 UNIT/ML ~~LOC~~ SOPN: PEN_INJECTOR | SUBCUTANEOUS | 0 refills | Status: DC

## 2018-04-04 NOTE — Progress Notes (Signed)
Subjective:     Patient ID: Gregory Friedman , male    DOB: [birth year] , 68 y.o.   MRN: [id number]   Chief Complaint  Patient presents with  . Diabetes    HPI  Pt is here for DM FU and he increased his Tresiba to 20 units qd and his glucose is getting better. His fasting has been 129-230, but mostly less than 200. Post prandial glucose has been 357. One of those high ones was from eating ice cream that day.  He is not sure about Vascepa since he had flair of great toe joint swelling and pain from taking fish oil tablets in the past, but never had a rash. He eats fish when his wife is out of town( since she cant stand the smell of fish). He states he only eats bakes or boiled food and not fried. Does not drink orange juice and does not eat much sweets.    Past Medical History:  Diagnosis Date  . Anxiety   . BPH (benign prostatic hyperplasia)   . Cancer ([place]) 2014   neck area  . Colon polyps   . Coronary artery disease   . Depression   . Diverticulosis   . GERD (gastroesophageal reflux disease)   . History of gout    toe  . 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 Hyperlipidemia   . Hypertension    states under control with med., has been on med. since 2003  . 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 Myocardial infarction ([place]) 2003  . Non-insulin dependent type 2 diabetes mellitus ([place])   . PONV (postoperative nausea and vomiting)   . S/P radiation therapy    Received 8 fractions, then stopped due to mucositis with Radiation/ Cisplatin  . Status post dilation of esophageal narrowing   . Trigger thumb of right hand 01/2016     Family History  Problem Relation Age of Onset  . Stroke Sister   . Diabetes Brother   . Seizures Mother   . Asthma Father   . Thyroid cancer Daughter   . Diabetes Sister   . Parkinson's disease Brother   . Heart disease Brother   . Diabetes Brother   . Colon  cancer Neg Hx      Current Outpatient Medications:  .  ACCU-CHEK FASTCLIX LANCETS MISC, CHECK BLOOD SUGAR ONE TIME DAILY AS DIRECTED, Disp: 150 each, Rfl: 2 .  ACCU-CHEK SOFTCLIX LANCETS lancets, Use as instructed, Disp: 100 each, Rfl: 12 .  antiseptic oral rinse (BIOTENE) LIQD, 15 mLs by Mouth Rinse route daily as needed for dry mouth. , Disp: , Rfl:  .  aspirin EC 81 MG tablet, Take 81 mg by mouth daily., Disp: , Rfl:  .  blood glucose meter kit and supplies KIT, Dispense based on patient and insurance preference. Use up to four times daily as directed. (FOR ICD-9 250.00, 250.01)., Disp: 1 each, Rfl: 0 .  Blood Glucose Monitoring Suppl (ACCU-CHEK AVIVA PLUS) w/Device KIT, 1 Device by Does not apply route 2 (two) times daily after a meal., Disp: 1 kit, Rfl: 1 .  co-enzyme Q-10 30 MG capsule, Take 30 mg by mouth daily., Disp: , Rfl:  .  enalapril (VASOTEC) 10 MG tablet, Take 1 tablet (10 mg total) by mouth daily., Disp: 90 tablet, Rfl: 1 .  fluticasone (FLONASE) 50 MCG/ACT nasal spray, Place 1-2 sprays  into both nostrils at bedtime., Disp: , Rfl:  .  glucose blood (ACCU-CHEK GUIDE) test strip, 1 each by Other route 2 (two) times daily. Use as instructed, Disp: 200 each, Rfl: 12 .  insulin degludec (TRESIBA FLEXTOUCH) 100 UNIT/ML SOPN FlexTouch Pen, 20  units ones a day, Disp: 1 pen, Rfl: 0 .  levothyroxine (SYNTHROID, LEVOTHROID) 25 MCG tablet, Take 25 mcg by mouth daily before breakfast., Disp: , Rfl:  .  metFORMIN (GLUMETZA) 1000 MG (MOD) 24 hr tablet, Take 1 tablet (1,000 mg total) by mouth 2 (two) times daily with a meal., Disp: 180 tablet, Rfl: 0 .  metoprolol succinate (TOPROL XL) 25 MG 24 hr tablet, Take 2 tablets (50 mg total) by mouth daily., Disp: 30 tablet, Rfl: 0 .  Multiple Vitamins-Minerals (MULTIVITAMIN WITH MINERALS) tablet, Take 1 tablet by mouth 2 (two) times daily. , Disp: , Rfl:  .  nitroGLYCERIN (NITROSTAT) 0.4 MG SL tablet, Place 1 tablet (0.4 mg total) under the tongue  every 5 (five) minutes as needed. Chest pain, Disp: 25 tablet, Rfl: 3 .  omeprazole (PRILOSEC) 40 MG capsule, TAKE 1 CAPSULE EVERY DAY BEFORE A MEAL, Disp: 90 capsule, Rfl: 0 .  Saw Palmetto, Serenoa repens, 320 MG CAPS, Take 1 capsule by mouth daily. , Disp: , Rfl:  .  sertraline (ZOLOFT) 25 MG tablet, One qhs for depression, Disp: 90 tablet, Rfl: 0 .  simvastatin (ZOCOR) 40 MG tablet, TAKE 1 TABLET EVERY DAY IN THE EVENING, Disp: 90 tablet, Rfl: 0   Allergies  Allergen Reactions  . Shellfish Allergy Swelling    In toes     Review of Systems  Constitutional: Negative for appetite change, chills, diaphoresis and fever.  HENT: Negative for congestion.   Eyes: Negative for visual disturbance.  Respiratory: Negative for cough and shortness of breath.   Cardiovascular: Negative for chest pain, palpitations and leg swelling.  Genitourinary: Negative for difficulty urinating.  Musculoskeletal: Negative for gait problem.  Skin: Negative for rash.  Psychiatric/Behavioral: Negative for sleep disturbance.       Feels his mood is better.      Today's Vitals   04/04/18 1414  BP: 132/74  Pulse: (!) 59  Temp: 98.2 F (36.8 C)  TempSrc: Oral  SpO2: 96%  Weight: 193 lb 12.8 oz (87.9 kg)  Height: 5' 6.2" (1.681 m)   Body mass index is 31.09 kg/m.   Objective:  Physical Exam Vitals signs and nursing note reviewed.  Constitutional:      General: He is not in acute distress.    Appearance: Normal appearance. He is not toxic-appearing.  HENT:     Head: Normocephalic.     Right Ear: External ear normal.     Left Ear: External ear normal.     Nose: Nose normal.  Eyes:     General: No scleral icterus.    Conjunctiva/sclera: Conjunctivae normal.  Neck:     Musculoskeletal: Neck supple. No neck rigidity.  Cardiovascular:     Rate and Rhythm: Normal rate and regular rhythm.     Heart sounds: No murmur.  Pulmonary:     Effort: Pulmonary effort is normal.     Breath sounds: Normal  breath sounds.  Musculoskeletal: Normal range of motion.  Lymphadenopathy:     Cervical: No cervical adenopathy.  Skin:    General: Skin is warm and dry.  Neurological:     Mental Status: He is alert and oriented to person, place, and time.  Gait: Gait normal.  Psychiatric:        Mood and Affect: Mood normal.        Behavior: Behavior normal.        Thought Content: Thought content normal.        Judgment: Judgment normal.     Assessment And Plan:     1. Uncontrolled type 2 diabetes mellitus with hyperglycemia ([place])- improving. I will have him increase his insulin to 25 U a day - Hemoglobin A1c  2. Abnormal renal finding- past history of this.  - Hemoglobin A1c - CMP14 + Anion Gap  FU as scheduled on 4/1 Dhanush Jokerst RODRIGUEZ-SOUTHWORTH, PA-C

## 2018-04-04 NOTE — Patient Instructions (Signed)
INCREASE YOUR INSULIN TO 25 UNITS A DAY  YOU MAY CONTINUE DOING THE DIARIES LIKE YOU HAVE AND BRING THEM WITH YOU NEXT TIME.

## 2018-04-05 LAB — CMP14 + ANION GAP
ALK PHOS: 66 IU/L (ref 39–117)
ALT: 57 IU/L — ABNORMAL HIGH (ref 0–44)
AST: 53 IU/L — ABNORMAL HIGH (ref 0–40)
Albumin/Globulin Ratio: 1.8 (ref 1.2–2.2)
Albumin: 4.7 g/dL (ref 3.8–4.8)
Anion Gap: 20 mmol/L — ABNORMAL HIGH (ref 10.0–18.0)
BUN/Creatinine Ratio: 12 (ref 10–24)
BUN: 16 mg/dL (ref 8–27)
Bilirubin Total: 0.5 mg/dL (ref 0.0–1.2)
CO2: 22 mmol/L (ref 20–29)
Calcium: 9.4 mg/dL (ref 8.6–10.2)
Chloride: 96 mmol/L (ref 96–106)
Creatinine, Ser: 1.37 mg/dL — ABNORMAL HIGH (ref 0.76–1.27)
GFR calc Af Amer: 61 mL/min/{1.73_m2} (ref >59)
GFR calc non Af Amer: 53 mL/min/{1.73_m2} — ABNORMAL LOW (ref >59)
Globulin, Total: 2.6 g/dL (ref 1.5–4.5)
Glucose: 289 mg/dL — ABNORMAL HIGH (ref 65–99)
Potassium: 4.6 mmol/L (ref 3.5–5.2)
SODIUM: 138 mmol/L (ref 134–144)
Total Protein: 7.3 g/dL (ref 6.0–8.5)

## 2018-04-05 LAB — HEMOGLOBIN A1C
Est. average glucose Bld gHb Est-mCnc: 246 mg/dL
HEMOGLOBIN A1C: 10.2 % — AB (ref 4.8–5.6)

## 2018-04-09 ENCOUNTER — Ambulatory Visit: Payer: Self-pay

## 2018-04-09 DIAGNOSIS — E1165 Type 2 diabetes mellitus with hyperglycemia: Secondary | ICD-10-CM

## 2018-04-09 DIAGNOSIS — F5101 Primary insomnia: Secondary | ICD-10-CM

## 2018-04-09 DIAGNOSIS — I10 Essential (primary) hypertension: Secondary | ICD-10-CM

## 2018-04-09 NOTE — Chronic Care Management (AMB) (Signed)
  Chronic Care Management   Follow Up Note   04/09/2018 Name: Gregory Friedman MRN: [id number] DOB: 1950-04-16  Referred by: Shelby Mattocks, PA-C Reason for referral : Chronic Care Management (INBOUND CALL FROM PATIENT )   Gregory Friedman is a 68 y.o. year old male who is a primary care patient of [place], Sunday Spillers, [place]. The CCM team was consulted for assistance with chronic disease management and care coordination needs.    Inbound call received from Gregory Friedman today requesting to cancel his CCM Face to Face visit scheduled for Thursday, 04/11/18. Gregory Friedman does not wish to reschedule at this time and states he will call back at a later time to schedule a CCM Telephonic encounter to establish his health related goals.    The CM team will reach out to the patient again over the next 14-21 days if no return call is received.     Barb Merino, RN,CCM Care Management Coordinator [place] [facility]  Direct Phone: [phone]

## 2018-04-11 ENCOUNTER — Ambulatory Visit: Payer: Medicare HMO

## 2018-04-24 ENCOUNTER — Ambulatory Visit: Payer: Medicare HMO | Admitting: Internal Medicine

## 2018-04-25 ENCOUNTER — Other Ambulatory Visit: Payer: Self-pay

## 2018-04-25 ENCOUNTER — Ambulatory Visit (INDEPENDENT_AMBULATORY_CARE_PROVIDER_SITE_OTHER): Payer: Medicare HMO | Admitting: Internal Medicine

## 2018-04-25 ENCOUNTER — Ambulatory Visit (INDEPENDENT_AMBULATORY_CARE_PROVIDER_SITE_OTHER): Payer: Medicare HMO

## 2018-04-25 ENCOUNTER — Ambulatory Visit: Payer: Medicare HMO | Admitting: Internal Medicine

## 2018-04-25 ENCOUNTER — Telehealth: Payer: Medicare HMO

## 2018-04-25 ENCOUNTER — Encounter: Payer: Self-pay | Admitting: Internal Medicine

## 2018-04-25 VITALS — BP 118/72 | HR 62 | Temp 97.8°F | Ht 66.2 in | Wt 191.4 lb

## 2018-04-25 DIAGNOSIS — E1165 Type 2 diabetes mellitus with hyperglycemia: Secondary | ICD-10-CM | POA: Diagnosis not present

## 2018-04-25 DIAGNOSIS — I10 Essential (primary) hypertension: Secondary | ICD-10-CM | POA: Diagnosis not present

## 2018-04-25 DIAGNOSIS — Z833 Family history of diabetes mellitus: Secondary | ICD-10-CM | POA: Diagnosis not present

## 2018-04-25 DIAGNOSIS — R946 Abnormal results of thyroid function studies: Secondary | ICD-10-CM | POA: Diagnosis not present

## 2018-04-25 MED ORDER — INSULIN DEGLUDEC 100 UNIT/ML ~~LOC~~ SOPN: PEN_INJECTOR | SUBCUTANEOUS | 2 refills | Status: DC

## 2018-04-25 MED ORDER — ACCU-CHEK FASTCLIX LANCETS MISC: 2 refills | Status: DC

## 2018-04-25 MED ORDER — INSULIN DEGLUDEC 100 UNIT/ML ~~LOC~~ SOPN: PEN_INJECTOR | SUBCUTANEOUS | 0 refills | Status: DC

## 2018-04-25 NOTE — Patient Instructions (Signed)
Visit Information  Goals Addressed      Patient Stated   . "I would like to talk to you about my diabetes" (pt-stated)       Current Barriers:  [name] Knowledge Deficits related to disease process and self health management for Diabetes  Nurse Case Manager Clinical Goal(s):  [name] Over the next 30 days, patient will demonstrate improved adherence to his prescribed treatment plan for Diabetes Management as evidenced by verbalizing understanding and adherence to monitoring CBGs, taking insulin as prescribed and improved adherence to following a Diabetic diet.   Interventions:   Telephone CCM follow up completed with patient today  . Evaluation of current treatment plan related to Diabetes and patient's adherence to plan as established by provider. . Discussed plans with patient for ongoing care management follow up and provided patient with direct contact information for care management team . Provided patient with printed educational materials related to Diabetes ([place] office to mail) . Scheduled a CCM telephone follow up to complete Diabetic screening and review member ed materials  Patient Self Care Activities:  . Self administers medications as prescribed . Attends all scheduled provider appointments . Calls pharmacy for medication refills . Performs ADL's independently . Performs IADL's independently . Calls provider office for new concerns or questions  Initial goal documentation         The patient verbalized understanding of instructions provided today and declined a print copy of patient instruction materials.   The CM team will reach out to the patient again over the next 7-14 days.   Barb Merino, RN,CCM Care Management Coordinator [place] [facility]  Direct Phone: [phone]

## 2018-04-25 NOTE — Patient Instructions (Signed)
Suba la insulina a 30 unidades al dia

## 2018-04-25 NOTE — Progress Notes (Signed)
Subjective:     Patient ID: Gregory Friedman , male    DOB: [birth year] , 68 y.o.   MRN: [id number]   Chief Complaint  Patient presents with  . Diabetes    HPI Here for DM FU, glucose 120-150's fasting, but post prandial are still 200's  And few spikes to 300 due to eating more carbs. Does not have any complaints today. States his Synthroid was d/c due to side effects, but it has not been checked in > 1 y. Would like to to have this test ran.     Past Medical History:  Diagnosis Date  . Anxiety   . BPH (benign prostatic hyperplasia)   . Cancer ([place]) 2014   neck area  . Colon polyps   . Coronary artery disease   . Depression   . Diverticulosis   . GERD (gastroesophageal reflux disease)   . History of gout    toe  . 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 Hyperlipidemia   . Hypertension    states under control with med., has been on med. since 2003  . 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 Myocardial infarction ([place]) 2003  . Non-insulin dependent type 2 diabetes mellitus ([place])   . PONV (postoperative nausea and vomiting)   . S/P radiation therapy    Received 8 fractions, then stopped due to mucositis with Radiation/ Cisplatin  . Status post dilation of esophageal narrowing   . Trigger thumb of right hand 01/2016     Family History  Problem Relation Age of Onset  . Stroke Sister   . Diabetes Brother   . Seizures Mother   . Asthma Father   . Thyroid cancer Daughter   . Diabetes Sister   . Parkinson's disease Brother   . Heart disease Brother   . Diabetes Brother   . Colon cancer Neg Hx      Current Outpatient Medications:  .  ACCU-CHEK FASTCLIX LANCETS MISC, CHECK BLOOD SUGAR ONE TIME DAILY AS DIRECTED, Disp: 150 each, Rfl: 2 .  antiseptic oral rinse (BIOTENE) LIQD, 15 mLs by Mouth Rinse route daily as needed for dry mouth. , Disp: , Rfl:  .  aspirin EC 81 MG tablet, Take 81  mg by mouth daily., Disp: , Rfl:  .  co-enzyme Q-10 30 MG capsule, Take 30 mg by mouth daily., Disp: , Rfl:  .  enalapril (VASOTEC) 10 MG tablet, Take 1 tablet (10 mg total) by mouth daily., Disp: 90 tablet, Rfl: 1 .  fluticasone (FLONASE) 50 MCG/ACT nasal spray, Place 1-2 sprays into both nostrils at bedtime., Disp: , Rfl:  .  glucose blood (ACCU-CHEK GUIDE) test strip, 1 each by Other route 2 (two) times daily. Use as instructed, Disp: 200 each, Rfl: 12 .  insulin degludec (TRESIBA FLEXTOUCH) 100 UNIT/ML SOPN FlexTouch Pen, inject 25  Units subcutaneously daily at bedtime, Disp: 15 pen, Rfl: 2 .  metoprolol succinate (TOPROL XL) 25 MG 24 hr tablet, Take 2 tablets (50 mg total) by mouth daily., Disp: 30 tablet, Rfl: 0 .  Multiple Vitamins-Minerals (MULTIVITAMIN WITH MINERALS) tablet, Take 1 tablet by mouth 2 (two) times daily. , Disp: , Rfl:  .  nitroGLYCERIN (NITROSTAT) 0.4 MG SL tablet, Place 1 tablet (0.4 mg total) under the tongue every 5 (five) minutes as needed. Chest pain, Disp: 25 tablet, Rfl: 3 .  omeprazole (PRILOSEC) 40 MG  capsule, TAKE 1 CAPSULE EVERY DAY BEFORE A MEAL, Disp: 90 capsule, Rfl: 0 .  Saw Palmetto, Serenoa repens, 320 MG CAPS, Take 1 capsule by mouth daily. , Disp: , Rfl:  .  sertraline (ZOLOFT) 25 MG tablet, One qhs for depression, Disp: 90 tablet, Rfl: 0 .  simvastatin (ZOCOR) 40 MG tablet, TAKE 1 TABLET EVERY DAY IN THE EVENING, Disp: 90 tablet, Rfl: 0 .  Icosapent Ethyl (VASCEPA) 0.5 g CAPS, Take 1 capsule by mouth daily for 15 days, THEN 1 capsule daily for 15 days. PT IS ABLE TO EAT FISH WITH NO PROBLEM. (Patient not taking: Reported on 04/25/2018), Disp: 16 capsule, Rfl: 0 .  levothyroxine (SYNTHROID, LEVOTHROID) 25 MCG tablet, Take 25 mcg by mouth daily before breakfast., Disp: , Rfl:    Allergies  Allergen Reactions  . Shellfish Allergy Swelling    In toes     Review of Systems  Constitutional: Negative for chills and diaphoresis.  HENT: Negative for  congestion.   Respiratory: Negative for cough, chest tightness and shortness of breath.   Cardiovascular: Negative for chest pain.  Genitourinary: Negative for difficulty urinating.  Psychiatric/Behavioral: Negative for sleep disturbance. The patient is not nervous/anxious.      Today's Vitals   04/25/18 1119  BP: 118/72  Pulse: 62  Temp: 97.8 F (36.6 C)  TempSrc: Oral  Weight: 191 lb 6.4 oz (86.8 kg)  Height: 5' 6.2" (1.681 m)  PainSc: 0-No pain   Body mass index is 30.71 kg/m.   Objective:  Physical Exam Vitals signs and nursing note reviewed.  Constitutional:      General: He is not in acute distress.    Appearance: Normal appearance. He is not toxic-appearing.  HENT:     Right Ear: External ear normal.     Left Ear: External ear normal.  Eyes:     General: No scleral icterus.    Conjunctiva/sclera: Conjunctivae normal.  Neck:     Musculoskeletal: Neck supple.  Cardiovascular:     Rate and Rhythm: Normal rate and regular rhythm.     Heart sounds: No murmur.  Pulmonary:     Effort: Pulmonary effort is normal.     Breath sounds: Normal breath sounds.  Musculoskeletal: Normal range of motion.  Lymphadenopathy:     Cervical: No cervical adenopathy.  Skin:    General: Skin is warm.  Neurological:     Mental Status: He is alert and oriented to person, place, and time.     Gait: Gait normal.  Psychiatric:        Mood and Affect: Mood normal.        Behavior: Behavior normal.        Thought Content: Thought content normal.        Judgment: Judgment normal.     Assessment And Plan:   1. Uncontrolled type 2 diabetes mellitus with hyperglycemia ([place])- improving.   2. Abnormal thyroid function test- had hypothyroid in the past, I will check his status.  - TSH - T4, Free - T3, free   FU in 1 month and we can do Phone visit.   Laquida Cotrell RODRIGUEZ-SOUTHWORTH, PA-C    THE PATIENT IS ENCOURAGED TO PRACTICE SOCIAL DISTANCING DUE TO THE COVID-19 PANDEMIC.

## 2018-04-25 NOTE — Chronic Care Management (AMB) (Signed)
  Chronic Care Management   Initial Visit Note  04/25/2018 Name: Gregory Friedman MRN: [id number] DOB: 04-14-1950  Referred by: Shelby Mattocks, PA-C Reason for referral : Chronic Care Management (TELEPHONE CCM FOLLOW UP )   Gregory Friedman is a 68 y.o. year old male who is a primary care patient of [place], Sunday Spillers, [place]. The CCM team was consulted for assistance with chronic disease management and care coordination needs.   Review of patient status, including review of consultants reports, relevant laboratory and other test results, and collaboration with appropriate care team members and the patient's provider was performed as part of comprehensive patient evaluation and provision of chronic care management services.    I spoke with Gregory Friedman by telephone today.   Objective:  Lab Results  Component Value Date   HGBA1C 10.2 (H) 04/04/2018   HGBA1C 9.1 (H) 01/24/2018   HGBA1C 7.6 (H) 10/23/2017   Lab Results  Component Value Date   MICROALBUR 1.0 04/19/2015   LDLCALC 61 03/21/2018   CREATININE 1.37 (H) 04/04/2018   BP Readings from Last 3 Encounters:  04/25/18 118/72  04/04/18 132/74  03/25/18 (!) 142/90    Goals Addressed      Patient Stated   . "I would like to talk to you about my diabetes" (pt-stated)       Current Barriers:  [name] Knowledge Deficits related to disease process and self health management for Diabetes  Nurse Case Manager Clinical Goal(s):  [name] Over the next 30 days, patient will demonstrate improved adherence to his prescribed treatment plan for Diabetes Management as evidenced by verbalizing understanding and adherence to monitoring CBGs, taking insulin as prescribed and improved adherence to following a Diabetic diet.   Interventions:   Telephone CCM follow up completed with patient today  . Evaluation of current treatment plan related to Diabetes and patient's adherence to plan as established by provider. . Discussed plans  with patient for ongoing care management follow up and provided patient with direct contact information for care management team . Provided patient with printed educational materials related to Diabetes ([place] office to mail) . Scheduled a CCM telephone follow up to complete Diabetic screening and review member ed materials  Patient Self Care Activities:  . Self administers medications as prescribed . Attends all scheduled provider appointments . Calls pharmacy for medication refills . Performs ADL's independently . Performs IADL's independently . Calls provider office for new concerns or questions  Initial goal documentation        The CM team will reach out to the patient again over the next 7-14 days.   Barb Merino, RN,CCM Care Management Coordinator [place] [facility]  Direct Phone: [phone]

## 2018-04-26 LAB — T3, FREE: T3, Free: 3.7 pg/mL (ref 2.0–4.4)

## 2018-04-26 LAB — TSH: TSH: 3.6 u[IU]/mL (ref 0.450–4.500)

## 2018-04-26 LAB — T4, FREE: Free T4: 1.46 ng/dL (ref 0.82–1.77)

## 2018-04-29 ENCOUNTER — Other Ambulatory Visit: Payer: Self-pay

## 2018-04-29 MED ORDER — INSULIN DEGLUDEC 100 UNIT/ML ~~LOC~~ SOPN: PEN_INJECTOR | SUBCUTANEOUS | 3 refills | Status: DC

## 2018-05-07 ENCOUNTER — Telehealth: Payer: Medicare HMO

## 2018-05-15 ENCOUNTER — Encounter: Payer: Self-pay | Admitting: Internal Medicine

## 2018-05-19 ENCOUNTER — Telehealth: Payer: Self-pay | Admitting: Internal Medicine

## 2018-05-19 NOTE — Telephone Encounter (Signed)
I called pt to check on how he is doing and how his glucose numbers are running. He is doing well, his fasting glucose has been 100-107 and post prandial 220-277 depending on what he eats. I advised him to increase his insuline to 35 unites a day. I will check on him on 5/7 on the phone, unless I am asked to return back to the office to see face to face pts. Pt understands. He will check on his apt on 4/30, since it has not been cancelled and I am not at the office the month of April. He was asked to save my cell #. He also stated he did not know how to answer the Mychart message.

## 2018-05-23 ENCOUNTER — Ambulatory Visit: Payer: Medicare HMO | Admitting: Internal Medicine

## 2018-05-28 ENCOUNTER — Other Ambulatory Visit: Payer: Self-pay | Admitting: Internal Medicine

## 2018-05-28 DIAGNOSIS — E1165 Type 2 diabetes mellitus with hyperglycemia: Secondary | ICD-10-CM

## 2018-05-28 DIAGNOSIS — R399 Unspecified symptoms and signs involving the genitourinary system: Secondary | ICD-10-CM

## 2018-05-28 NOTE — Progress Notes (Signed)
BMP and HGBA1C ordered to be done in May.

## 2018-05-29 ENCOUNTER — Other Ambulatory Visit: Payer: Medicare HMO

## 2018-05-29 ENCOUNTER — Other Ambulatory Visit: Payer: Self-pay

## 2018-05-29 ENCOUNTER — Other Ambulatory Visit: Payer: Self-pay | Admitting: Internal Medicine

## 2018-05-29 DIAGNOSIS — E1165 Type 2 diabetes mellitus with hyperglycemia: Secondary | ICD-10-CM | POA: Diagnosis not present

## 2018-05-29 DIAGNOSIS — R399 Unspecified symptoms and signs involving the genitourinary system: Secondary | ICD-10-CM | POA: Diagnosis not present

## 2018-05-30 LAB — BMP8+ANION GAP
Anion Gap: 16 mmol/L (ref 10.0–18.0)
BUN/Creatinine Ratio: 10 (ref 10–24)
BUN: 15 mg/dL (ref 8–27)
CO2: 25 mmol/L (ref 20–29)
Calcium: 9.8 mg/dL (ref 8.6–10.2)
Chloride: 100 mmol/L (ref 96–106)
Creatinine, Ser: 1.52 mg/dL — ABNORMAL HIGH (ref 0.76–1.27)
GFR calc Af Amer: 54 mL/min/{1.73_m2} — ABNORMAL LOW (ref >59)
GFR calc non Af Amer: 46 mL/min/{1.73_m2} — ABNORMAL LOW (ref >59)
Glucose: 178 mg/dL — ABNORMAL HIGH (ref 65–99)
Potassium: 4.5 mmol/L (ref 3.5–5.2)
Sodium: 141 mmol/L (ref 134–144)

## 2018-05-30 LAB — HEMOGLOBIN A1C
Est. average glucose Bld gHb Est-mCnc: 209 mg/dL
Hgb A1c MFr Bld: 8.9 % — ABNORMAL HIGH (ref 4.8–5.6)

## 2018-06-04 ENCOUNTER — Other Ambulatory Visit: Payer: Self-pay | Admitting: Internal Medicine

## 2018-06-05 ENCOUNTER — Telehealth: Payer: Medicare HMO

## 2018-06-10 ENCOUNTER — Ambulatory Visit (INDEPENDENT_AMBULATORY_CARE_PROVIDER_SITE_OTHER): Payer: Medicare HMO

## 2018-06-10 ENCOUNTER — Telehealth: Payer: Medicare HMO

## 2018-06-10 DIAGNOSIS — E1165 Type 2 diabetes mellitus with hyperglycemia: Secondary | ICD-10-CM

## 2018-06-10 DIAGNOSIS — I10 Essential (primary) hypertension: Secondary | ICD-10-CM

## 2018-06-10 DIAGNOSIS — R399 Unspecified symptoms and signs involving the genitourinary system: Secondary | ICD-10-CM

## 2018-06-10 NOTE — Chronic Care Management (AMB) (Signed)
  Chronic Care Management   Note  06/10/2018 Name: Arsenio Schnorr MRN: [id number] DOB: Jul 15, 1950   Chronic Care Management   Outreach Note  06/10/2018 Name: Dailyn Kempner MRN: [id number] DOB: 01-09-1951  Referred by: Shelby Mattocks, PA-C Reason for referral : Chronic Care Management (CCM RN Telephone Follow Up )   An unsuccessful telephone outreach was attempted today. The patient was referred to the case management team by Laurita Quint for assistance with chronic care management and care coordination.   Follow Up Plan: The CCM team will reach out to the patient again over the next 3-5 days.   Barb Merino, RN,CCM Care Management Coordinator [place] [facility]  Direct Phone: [phone]

## 2018-06-11 ENCOUNTER — Telehealth: Payer: Self-pay

## 2018-06-11 NOTE — Telephone Encounter (Signed)
1st attempt to give lab results. Voicemail is full unable to leave message

## 2018-06-11 NOTE — Patient Instructions (Signed)
Visit Information  Goals Addressed      Patient Stated   . "I would like to talk to you about my diabetes" (pt-stated)       Current Barriers:  [name] Knowledge Deficits related to disease process and self health management for Diabetes  Nurse Case Manager Clinical Goal(s):  [name] Over the next 30 days, patient will demonstrate improved adherence to his prescribed treatment plan for Diabetes Management as evidenced by verbalizing understanding and adherence to monitoring CBGs, taking insulin as prescribed and improved adherence to following a Diabetic diet. 06/10/18: Goal date revised to 90 days due to delays in treatment secondary to COVID-19.   Interventions:   Telephone CCM follow up completed with patient today  . Evaluation of current treatment plan related to Diabetes and patient's adherence to plan as established by provider. . Discussed patient received and reviewed the mailed diabetes printed ed mats-he denies questions . Discussed additional printed ed mats to be mailed related Meal Planning using the Plate Method    Reviewed and discussed adherence to taking prescribed insulin; pt noted he switched from taking his insulin from the pm to the am and is noticing his BS are within normal limits  Discussed plans with patient for ongoing care management follow up and provided patient with direct contact information for care management team  Scheduled a CCM telephone follow up with patient in about 2 weeks  Sent pharmacy referral to [place] [facility] to further evaluate insulin regimen-pt aware  Patient Self Care Activities:  . Self administers medications as prescribed . Attends all scheduled provider appointments . Calls pharmacy for medication refills . Performs ADL's independently . Performs IADL's independently . Calls provider office for new concerns or questions  Please see past updates related to this goal by clicking on the "Past Updates" button in the selected goal         The patient verbalized understanding of instructions provided today and declined a print copy of patient instruction materials.   The CCM team will reach out to the patient again over the next 2 weeks.   Barb Merino, RN,CCM Care Management Coordinator [place] [facility]  Direct Phone: [phone]

## 2018-06-11 NOTE — Telephone Encounter (Signed)
-----   Message from Shelby Mattocks, PA-C sent at 06/10/2018  6:57 PM EDT ----- Pt does not know how to pick up Mychart note. I called him to inform him about his labs, but his vice mail is full. Please call him and inform him that his kidney function is a little worse, and I would like him to hold off from taking any of his supplements until I see him on the 9th and we will repeat it again. Also his DM is better, A1C went down from 10.2 to 8.9, but we need it to be less than 7. Normal is 5.6 or less.

## 2018-06-11 NOTE — Chronic Care Management (AMB) (Signed)
  Chronic Care Management   Follow Up Note   06/10/2018 Name: Gregory Friedman MRN: [id number] DOB: 08/07/1950  Referred by: Shelby Mattocks, PA-C Reason for referral : Chronic Care Management (CCM RN Telephone Follow Up )   Gregory Friedman is a 68 y.o. year old male who is a primary care patient of [place], Sunday Spillers, [place]. The CCM team was consulted for assistance with chronic disease management and care coordination needs.    Review of patient status, including review of consultants reports, relevant laboratory and other test results, and collaboration with appropriate care team members and the patient's provider was performed as part of comprehensive patient evaluation and provision of chronic care management services.   I spoke with Gregory Friedman by telephone today for a CCM follow up.    Goals Addressed      Patient Stated   . "I would like to talk to you about my diabetes" (pt-stated)       Current Barriers:  [name] Knowledge Deficits related to disease process and self health management for Diabetes  Nurse Case Manager Clinical Goal(s):  [name] Over the next 30 days, patient will demonstrate improved adherence to his prescribed treatment plan for Diabetes Management as evidenced by verbalizing understanding and adherence to monitoring CBGs, taking insulin as prescribed and improved adherence to following a Diabetic diet. 06/10/18: Goal date revised to 90 days due to delays in treatment secondary to COVID-19.   Interventions:   Telephone CCM follow up completed with patient today  . Evaluation of current treatment plan related to Diabetes and patient's adherence to plan as established by provider. . Discussed patient received and reviewed the mailed diabetes printed ed mats-he denies questions . Discussed additional printed ed mats to be mailed related Meal Planning using the Plate Method    Reviewed and discussed adherence to taking prescribed insulin; pt noted he  switched from taking his insulin from the pm to the am and is noticing his BS are within normal limits  Discussed plans with patient for ongoing care management follow up and provided patient with direct contact information for care management team  Scheduled a CCM telephone follow up with patient in about 2 weeks  Sent pharmacy referral to [place] [facility] [facility] to further evaluate insulin regimen-pt aware  Patient Self Care Activities:  . Self administers medications as prescribed . Attends all scheduled provider appointments . Calls pharmacy for medication refills . Performs ADL's independently . Performs IADL's independently . Calls provider office for new concerns or questions  Please see past updates related to this goal by clicking on the "Past Updates" button in the selected goal         The CCM team will reach out to the patient again over the next 2 weeks.    Barb Merino, RN,CCM Care Management Coordinator [place] [facility]  Direct Phone: [phone]

## 2018-06-12 ENCOUNTER — Telehealth: Payer: Self-pay

## 2018-06-12 NOTE — Telephone Encounter (Signed)
-----   Message from Shelby Mattocks, PA-C sent at 06/10/2018  6:57 PM EDT ----- Pt does not know how to pick up Mychart note. I called him to inform him about his labs, but his vice mail is full. Please call him and inform him that his kidney function is a little worse, and I would like him to hold off from taking any of his supplements until I see him on the 9th and we will repeat it again. Also his DM is better, A1C went down from 10.2 to 8.9, but we need it to be less than 7. Normal is 5.6 or less.

## 2018-06-12 NOTE — Telephone Encounter (Signed)
2nd attempt to give lab results

## 2018-06-14 ENCOUNTER — Telehealth: Payer: Medicare HMO

## 2018-06-18 ENCOUNTER — Telehealth: Payer: Medicare HMO

## 2018-06-24 ENCOUNTER — Telehealth: Payer: Medicare HMO

## 2018-06-27 ENCOUNTER — Other Ambulatory Visit: Payer: Self-pay | Admitting: Internal Medicine

## 2018-07-02 ENCOUNTER — Ambulatory Visit: Payer: Medicare HMO | Admitting: Internal Medicine

## 2018-07-03 ENCOUNTER — Ambulatory Visit (INDEPENDENT_AMBULATORY_CARE_PROVIDER_SITE_OTHER): Payer: Medicare HMO | Admitting: Pharmacist

## 2018-07-03 DIAGNOSIS — I10 Essential (primary) hypertension: Secondary | ICD-10-CM

## 2018-07-03 DIAGNOSIS — E785 Hyperlipidemia, unspecified: Secondary | ICD-10-CM | POA: Diagnosis not present

## 2018-07-03 DIAGNOSIS — E1165 Type 2 diabetes mellitus with hyperglycemia: Secondary | ICD-10-CM | POA: Diagnosis not present

## 2018-07-03 NOTE — Chronic Care Management (AMB) (Signed)
  Chronic Care Management   Outreach Note  07/03/2018 Name: Gregory Friedman MRN: [id number] DOB: 1951-01-16  Referred by: Shelby Mattocks, PA-C Reason for referral : Chronic Care Management   An unsuccessful telephone outreach was attempted today.  Patient does not have voicemail set up for contacts listed.  The patient was referred to the case management team by for assistance with chronic care management and care coordination.  Thorough chart review was performed and patient's case was discussed with CCM team.  Patient's next PCP appt is scheduled for 07/04/18.     Follow Up Plan: The care management team will reach out to the patient again over the next 2 weeks.   Regina Eck, PharmD, BCPS Clinical Pharmacist, [facility] [place]: [phone]

## 2018-07-04 ENCOUNTER — Other Ambulatory Visit: Payer: Self-pay

## 2018-07-04 ENCOUNTER — Encounter: Payer: Self-pay | Admitting: Internal Medicine

## 2018-07-04 ENCOUNTER — Ambulatory Visit (INDEPENDENT_AMBULATORY_CARE_PROVIDER_SITE_OTHER): Payer: Medicare HMO | Admitting: Internal Medicine

## 2018-07-04 VITALS — BP 128/62 | HR 50 | Temp 98.7°F | Ht 66.0 in | Wt 191.6 lb

## 2018-07-04 DIAGNOSIS — I10 Essential (primary) hypertension: Secondary | ICD-10-CM | POA: Diagnosis not present

## 2018-07-04 DIAGNOSIS — E1165 Type 2 diabetes mellitus with hyperglycemia: Secondary | ICD-10-CM | POA: Diagnosis not present

## 2018-07-04 DIAGNOSIS — E119 Type 2 diabetes mellitus without complications: Secondary | ICD-10-CM

## 2018-07-04 NOTE — Progress Notes (Signed)
Subjective:     Patient ID: Gregory Friedman , male    DOB: Dec 08, 1950 , 68 y.o.   MRN: [id number]   Chief Complaint  Patient presents with  . Diabetes    kidney function adnormal last visit    HPI Pt is here for DM FU. His fasting glucose have been below 200, and post prandial glucose in the low 200's. Denies having any episodes of hypoglycemia. Denies paresthesia of his feet or urinary problems.  He has only stopped vitamins, but continues CoQ 10 and saw pameto. Had dental cleaning  And had pain for 5 days on his L face. Uses Biotin rinse and sensidine.  His last eye xm was may 2019, and has not heard from them to get apt this year yet.   Past Medical History:  Diagnosis Date  . Anxiety   . BPH (benign prostatic hyperplasia)   . Cancer ([place]) 2014   neck area  . Colon polyps   . Coronary artery disease   . Depression   . Diverticulosis   . GERD (gastroesophageal reflux disease)   . History of gout    toe  . 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 Hyperlipidemia   . Hypertension    states under control with med., has been on med. since 2003  . 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 Myocardial infarction ([place]) 2003  . Non-insulin dependent type 2 diabetes mellitus ([place])   . PONV (postoperative nausea and vomiting)   . S/P radiation therapy    Received 8 fractions, then stopped due to mucositis with Radiation/ Cisplatin  . Status post dilation of esophageal narrowing   . Trigger thumb of right hand 01/2016     Family History  Problem Relation Age of Onset  . Stroke Sister   . Diabetes Brother   . Seizures Mother   . Asthma Father   . Thyroid cancer Daughter   . Diabetes Sister   . Parkinson's disease Brother   . Heart disease Brother   . Diabetes Brother   . Colon cancer Neg Hx      Current Outpatient Medications:  .  Accu-Chek FastClix Lancets MISC, CHECK BLOOD SUGAR ONE  TIME DAILY AS DIRECTED, Disp: 150 each, Rfl: 2 .  antiseptic oral rinse (BIOTENE) LIQD, 15 mLs by Mouth Rinse route daily as needed for dry mouth. , Disp: , Rfl:  .  aspirin EC 81 MG tablet, Take 81 mg by mouth daily., Disp: , Rfl:  .  co-enzyme Q-10 30 MG capsule, Take 30 mg by mouth daily., Disp: , Rfl:  .  enalapril (VASOTEC) 10 MG tablet, TAKE 1 TABLET EVERY DAY, Disp: 90 tablet, Rfl: 1 .  fluticasone (FLONASE) 50 MCG/ACT nasal spray, Place 1-2 sprays into both nostrils at bedtime., Disp: , Rfl:  .  glucose blood (ACCU-CHEK GUIDE) test strip, 1 each by Other route 2 (two) times daily. Use as instructed, Disp: 200 each, Rfl: 12 .  Icosapent Ethyl (VASCEPA) 0.5 g CAPS, Take 1 capsule by mouth daily for 15 days, THEN 1 capsule daily for 15 days. PT IS ABLE TO EAT FISH WITH NO PROBLEM. (Patient not taking: Reported on 04/25/2018), Disp: 16 capsule, Rfl: 0 .  insulin degludec (TRESIBA FLEXTOUCH) 100 UNIT/ML SOPN FlexTouch Pen, inject 30  Units subcutaneously daily at bedtime (Patient taking differently: 35 Units daily. inject 35  Units subcutaneously daily  at bedtime), Disp: 15 pen, Rfl: 3 .  levothyroxine (SYNTHROID, LEVOTHROID) 25 MCG tablet, Take 25 mcg by mouth daily before breakfast., Disp: , Rfl:  .  metoprolol succinate (TOPROL XL) 25 MG 24 hr tablet, Take 2 tablets (50 mg total) by mouth daily., Disp: 30 tablet, Rfl: 0 .  Multiple Vitamins-Minerals (MULTIVITAMIN WITH MINERALS) tablet, Take 1 tablet by mouth 2 (two) times daily. , Disp: , Rfl:  .  nitroGLYCERIN (NITROSTAT) 0.4 MG SL tablet, Place 1 tablet (0.4 mg total) under the tongue every 5 (five) minutes as needed. Chest pain, Disp: 25 tablet, Rfl: 3 .  omeprazole (PRILOSEC) 40 MG capsule, TAKE 1 CAPSULE EVERY DAY BEFORE A MEAL, Disp: 90 capsule, Rfl: 0 .  Saw Palmetto, Serenoa repens, 320 MG CAPS, Take 1 capsule by mouth daily. , Disp: , Rfl:  .  sertraline (ZOLOFT) 25 MG tablet, One qhs for depression, Disp: 90 tablet, Rfl: 0 .   simvastatin (ZOCOR) 40 MG tablet, TAKE 1 TABLET EVERY EVENING, Disp: 90 tablet, Rfl: 0   Allergies  Allergen Reactions  . Shellfish Allergy Swelling    In toes     Review of Systems  Denies CP, SOB, urinary, GI, skin issues. He is sleeping well. Has been staying well and healthy with cough, fever or myalgias.  Today's Vitals   07/04/18 1150  BP: 128/62  Pulse: (!) 50  Temp: 98.7 F (37.1 C)  TempSrc: Oral  SpO2: 98%  Weight: 191 lb 9.6 oz (86.9 kg)  Height: 5\' 6"  (1.676 m)   Body mass index is 30.93 kg/m.   Objective:  Physical Exam   Constitutional: She is oriented to person, place, and time. She appears well-developed and well-nourished. No distress.  HENT:  Head: Normocephalic and atraumatic.  Right Ear: External ear normal.  Left Ear: External ear normal.  Nose: Nose normal.  Eyes: Conjunctivae are normal. Right eye exhibits no discharge. Left eye exhibits no discharge. No scleral icterus.  Neck: Neck supple. No thyromegaly present.  No carotid bruits bilaterally  Cardiovascular: Normal rate and regular rhythm.  No murmur heard. Pulmonary/Chest: Effort normal and breath sounds normal. No respiratory distress.  Musculoskeletal: Normal range of motion. She exhibits no edema.  Lymphadenopathy: he has no cervical adenopathy.  Neurological: he is alert and oriented to person, place, and time.  Skin: Skin is warm and dry. Capillary refill takes less than 2 seconds. No rash noted. he is not diaphoretic.  Psychiatric: She has a normal mood and affect. His behavior is normal. Judgment and thought content normal.  Nursing note reviewed.    Assessment And Plan:     1. Uncontrolled type 2 diabetes mellitus with hyperglycemia (H[facility])    HGBAC from last month 8.9, improved but not optimal yet. - Ambulatory referral to Chronic Care Management Services  Was encouraged to keep apt with [facility]. He will continue with same dose for now. I ordered CBC and CMP.  FU in 1 month.  Gregory Horan  RODRIGUEZ-SOUTHWORTH, PA-C    THE PATIENT IS ENCOURAGED TO PRACTICE SOCIAL DISTANCING DUE TO THE COVID-19 PANDEMIC.

## 2018-07-04 NOTE — Patient Instructions (Signed)
MOUTH PULLING This usually takes somewhere between 10 and 15 minutes, and certainly no longer than 20. You don't want to hold it in your mouth so long that the toxins start to reabsorb. Spit into the trash can and rinse your mouth thoroughly with warm water, which tends to do a better job getting the oil out than cold.

## 2018-07-05 LAB — CMP14 + ANION GAP
ALT: 20 IU/L (ref 0–44)
AST: 22 IU/L (ref 0–40)
Albumin/Globulin Ratio: 1.8 (ref 1.2–2.2)
Albumin: 5 g/dL — ABNORMAL HIGH (ref 3.8–4.8)
Alkaline Phosphatase: 58 IU/L (ref 39–117)
Anion Gap: 15 mmol/L (ref 10.0–18.0)
BUN/Creatinine Ratio: 11 (ref 10–24)
BUN: 18 mg/dL (ref 8–27)
Bilirubin Total: 0.7 mg/dL (ref 0.0–1.2)
CO2: 26 mmol/L (ref 20–29)
Calcium: 9.9 mg/dL (ref 8.6–10.2)
Chloride: 101 mmol/L (ref 96–106)
Creatinine, Ser: 1.59 mg/dL — ABNORMAL HIGH (ref 0.76–1.27)
GFR calc Af Amer: 51 mL/min/1.73 — ABNORMAL LOW
GFR calc non Af Amer: 44 mL/min/1.73 — ABNORMAL LOW
Globulin, Total: 2.8 g/dL (ref 1.5–4.5)
Glucose: 117 mg/dL — ABNORMAL HIGH (ref 65–99)
Potassium: 4.4 mmol/L (ref 3.5–5.2)
Sodium: 142 mmol/L (ref 134–144)
Total Protein: 7.8 g/dL (ref 6.0–8.5)

## 2018-07-05 LAB — CBC
Hematocrit: 43.6 % (ref 37.5–51.0)
Hemoglobin: 14.5 g/dL (ref 13.0–17.7)
MCH: 27.8 pg (ref 26.6–33.0)
MCHC: 33.3 g/dL (ref 31.5–35.7)
MCV: 84 fL (ref 79–97)
Platelets: 190 10*3/uL (ref 150–450)
RBC: 5.22 x10E6/uL (ref 4.14–5.80)
RDW: 13.2 % (ref 11.6–15.4)
WBC: 5.3 10*3/uL (ref 3.4–10.8)

## 2018-07-08 ENCOUNTER — Ambulatory Visit: Payer: Self-pay

## 2018-07-08 ENCOUNTER — Telehealth: Payer: Medicare HMO

## 2018-07-08 DIAGNOSIS — F5101 Primary insomnia: Secondary | ICD-10-CM

## 2018-07-08 DIAGNOSIS — E1165 Type 2 diabetes mellitus with hyperglycemia: Secondary | ICD-10-CM

## 2018-07-08 DIAGNOSIS — E039 Hypothyroidism, unspecified: Secondary | ICD-10-CM

## 2018-07-08 DIAGNOSIS — I10 Essential (primary) hypertension: Secondary | ICD-10-CM

## 2018-07-08 NOTE — Chronic Care Management (AMB) (Signed)
  Chronic Care Management   Outreach Note  07/08/2018 Name: Gregory Friedman MRN: [id number] DOB: 1950-11-16  Referred by: Shelby Mattocks, PA-C Reason for referral : Chronic Care Management (CCM RNCM Telephone Follow Up )   An unsuccessful telephone outreach was attempted today. The patient was referred to the case management team by Colin Benton for assistance with chronic care management and care coordination.   Follow Up Plan: The care management team will reach out to the patient again over the next 5-7 days.    Barb Merino, RN, BSN, CCM Care Management Coordinator [facility]  Direct Phone: [phone]

## 2018-07-10 ENCOUNTER — Telehealth: Payer: Self-pay

## 2018-07-10 ENCOUNTER — Ambulatory Visit: Payer: Medicare HMO | Admitting: Pharmacist

## 2018-07-10 DIAGNOSIS — E1165 Type 2 diabetes mellitus with hyperglycemia: Secondary | ICD-10-CM

## 2018-07-10 DIAGNOSIS — I10 Essential (primary) hypertension: Secondary | ICD-10-CM

## 2018-07-10 NOTE — Chronic Care Management (AMB) (Signed)
Chronic Care Management   Initial Visit Note  07/10/2018 Name: Gregory Friedman MRN: [id number] DOB: 1950-12-09  Referred by: Shelby Mattocks, PA-C Reason for referral : Chronic Care Management   Aharon Carriere is a 68 y.o. year old male who is a primary care patient of [place], Sunday Spillers, [place]. The CCM team was consulted for assistance with chronic disease management and care coordination needs.   Review of patient status, including review of consultants reports, relevant laboratory and other test results, and collaboration with appropriate care team members and the patient's provider was performed as part of comprehensive patient evaluation and provision of chronic care management services.    I met face to face with Gregory Friedman today in clinic.  Objective:   Goals Addressed            This Visit's Progress     Patient Stated   . "I would like to talk to you about my diabetes" (pt-stated)       Current Barriers:  [name] Knowledge Deficits related to disease process and self health management for Diabetes  Nurse Case Manager Clinical Goal(s):  [name] Over the next 90 days, patient will demonstrate improved adherence to his prescribed treatment plan for Diabetes Management as evidenced by verbalizing understanding and adherence to monitoring CBGs, taking insulin as prescribed and improved adherence to following a Diabetic diet. 06/10/18: Goal date revised to 90 days due to delays in treatment secondary to COVID-19.   CCM RN CM Interventions:   Telephone CCM follow up completed with patient today  . Evaluation of current treatment plan related to Diabetes and patient's adherence to plan as established by provider. . Discussed patient received and reviewed the mailed diabetes printed ed mats-he denies questions . Discussed additional printed ed mats to be mailed related Meal Planning using the Plate Method    Reviewed and discussed adherence to taking  prescribed insulin; pt noted he switched from taking his insulin from the pm to the am and is noticing his BS are within normal limits  Discussed plans with patient for ongoing care management follow up and provided patient with direct contact information for care management team  Scheduled a CCM telephone follow up with patient in about 2 weeks  Sent pharmacy referral to [place] [facility] to further evaluate insulin regimen-pt aware  CCM PharmD Interventions: . Face to Face meeting with patient completed today 07/10/18. [name] Comprehensive medication review performed and list updated in the EMR . Patient is tolerating therapy well and denies adverse events. . His FBG in the office this AM was 150, however patient ate potato chips last night.  Reviewed meal planning and how to find alternatives to snacks with higher carbohydrates. . Patient is now injecting 35 units daily of Tresiba as monotherapy for his diabetes. His A1c has decreased from 10.2-->8.9 in 05/29/18.  Encouraged patient to continue his great progress.  Will apply for financial assistance for [place] and pen needles.  Patient Self Care Activities:  . Self administers medications as prescribed . Attends all scheduled provider appointments . Calls pharmacy for medication refills . Performs ADL's independently . Performs IADL's independently . Calls provider office for new concerns or questions  Please see past updates related to this goal by clicking on the "Past Updates" button in the selected goal       . I would like to apply for financial assistance for Tresiba (pt-stated)       Current Barriers:  . Financial Barriers  Pharmacist Clinical  Goal(s):  [name] Over the next 14 days, patient will work with PharmD to address needs related to applying for financial assistance for Tresiba  Interventions: . Comprehensive medication review performed. . Discussed plans with patient for ongoing care management follow up and provided  patient with direct contact information for care management team  Patient Self Care Activities:  . Self administers medications as prescribed . Attends all scheduled provider appointments . Calls pharmacy for medication refills  Initial goal documentation        Plan:   The care management team will reach out to the patient again over the next 2 weeks.   Regina Eck, PharmD, BCPS Clinical Pharmacist, [facility] [place]: [phone]

## 2018-07-10 NOTE — Patient Instructions (Signed)
Visit Information  Goals Addressed            This Visit's Progress     Patient Stated   . "I would like to talk to you about my diabetes" (pt-stated)       Current Barriers:  [name] Knowledge Deficits related to disease process and self health management for Diabetes  Nurse Case Manager Clinical Goal(s):  [name] Over the next 90 days, patient will demonstrate improved adherence to his prescribed treatment plan for Diabetes Management as evidenced by verbalizing understanding and adherence to monitoring CBGs, taking insulin as prescribed and improved adherence to following a Diabetic diet. 06/10/18: Goal date revised to 90 days due to delays in treatment secondary to COVID-19.   CCM RN CM Interventions:   Telephone CCM follow up completed with patient today  . Evaluation of current treatment plan related to Diabetes and patient's adherence to plan as established by provider. . Discussed patient received and reviewed the mailed diabetes printed ed mats-he denies questions . Discussed additional printed ed mats to be mailed related Meal Planning using the Plate Method    Reviewed and discussed adherence to taking prescribed insulin; pt noted he switched from taking his insulin from the pm to the am and is noticing his BS are within normal limits  Discussed plans with patient for ongoing care management follow up and provided patient with direct contact information for care management team  Scheduled a CCM telephone follow up with patient in about 2 weeks  Sent pharmacy referral to [place] [facility] to further evaluate insulin regimen-pt aware  CCM PharmD Interventions: . Face to Face meeting with patient completed today 07/10/18. [name] Comprehensive medication review performed and list updated in the EMR . Patient is tolerating therapy well and denies adverse events. . His FBG in the office this AM was 150, however patient ate potato chips last night.  Reviewed meal planning and how to find  alternatives to snacks with higher carbohydrates. . Patient is now injecting 35 units daily of Tresiba as monotherapy for his diabetes. His A1c has decreased from 10.2-->8.9 in 05/29/18.  Encouraged patient to continue his great progress.  Will apply for financial assistance for [place] and pen needles.  Patient Self Care Activities:  . Self administers medications as prescribed . Attends all scheduled provider appointments . Calls pharmacy for medication refills . Performs ADL's independently . Performs IADL's independently . Calls provider office for new concerns or questions  Please see past updates related to this goal by clicking on the "Past Updates" button in the selected goal       . I would like to apply for financial assistance for Tresiba (pt-stated)       Current Barriers:  . Financial Barriers  Pharmacist Clinical Goal(s):  [name] Over the next 14 days, patient will work with PharmD to address needs related to applying for financial assistance for Tresiba  Interventions: . Comprehensive medication review performed. . Discussed plans with patient for ongoing care management follow up and provided patient with direct contact information for care management team  Patient Self Care Activities:  . Self administers medications as prescribed . Attends all scheduled provider appointments . Calls pharmacy for medication refills  Initial goal documentation        The patient verbalized understanding of instructions provided today and declined a print copy of patient instruction materials.   The care management team will reach out to the patient again over the next 2 weeks.  Regina Eck, PharmD, BCPS Clinical Pharmacist, [facility] [place]: [phone]

## 2018-07-13 ENCOUNTER — Other Ambulatory Visit: Payer: Self-pay | Admitting: Internal Medicine

## 2018-07-15 ENCOUNTER — Telehealth: Payer: Medicare HMO

## 2018-07-17 ENCOUNTER — Telehealth: Payer: Medicare HMO

## 2018-07-22 ENCOUNTER — Ambulatory Visit: Payer: Self-pay | Admitting: Pharmacist

## 2018-07-22 DIAGNOSIS — E1165 Type 2 diabetes mellitus with hyperglycemia: Secondary | ICD-10-CM

## 2018-07-22 DIAGNOSIS — E785 Hyperlipidemia, unspecified: Secondary | ICD-10-CM

## 2018-07-22 DIAGNOSIS — I10 Essential (primary) hypertension: Secondary | ICD-10-CM

## 2018-07-23 NOTE — Progress Notes (Signed)
Chronic Care Management   Visit Note  07/22/2018 Name: Gregory Friedman MRN: [id number] DOB: August 28, 1950  Referred by: Shelby Mattocks, PA-C Reason for referral : Chronic Care Management   Gregory Friedman is a 68 y.o. year old male who is a primary care patient of [place], Sunday Spillers, [place]. The CCM team was consulted for assistance with chronic disease management and care coordination needs.   Review of patient status, including review of consultants reports, relevant laboratory and other test results, and collaboration with appropriate care team members and the patient's provider was performed as part of comprehensive patient evaluation and provision of chronic care management services.    I spoke with Gregory Friedman by telephone today.  Objective:   Goals Addressed            This Visit's Progress     Patient Stated   . "I would like to talk to you about my diabetes" (pt-stated)       Current Barriers:  [name] Knowledge Deficits related to disease process and self health management for Diabetes  Nurse Case Manager Clinical Goal(s):  [name] Over the next 90 days, patient will demonstrate improved adherence to his prescribed treatment plan for Diabetes Management as evidenced by verbalizing understanding and adherence to monitoring CBGs, taking insulin as prescribed and improved adherence to following a Diabetic diet. 06/10/18: Goal date revised to 90 days due to delays in treatment secondary to COVID-19.   CCM RN CM Interventions:   Telephone CCM follow up completed with patient today  . Evaluation of current treatment plan related to Diabetes and patient's adherence to plan as established by provider. . Discussed patient received and reviewed the mailed diabetes printed ed mats-he denies questions . Discussed additional printed ed mats to be mailed related Meal Planning using the Plate Method    Reviewed and discussed adherence to taking prescribed insulin;  pt noted he switched from taking his insulin from the pm to the am and is noticing his BS are within normal limits  Discussed plans with patient for ongoing care management follow up and provided patient with direct contact information for care management team  Scheduled a CCM telephone follow up with patient in about 2 weeks  Sent pharmacy referral to [place] [facility] to further evaluate insulin regimen-pt aware  CCM PharmD Interventions: . Face to Face meeting with patient completed today 07/22/18. [name] Comprehensive medication review performed and list updated in the EMR . Patient is tolerating therapy well and denies adverse events. . His FBG this AM was 120.  He denies hypoglycemia (Bg<70).  He reports that most of his FBGs range from 90-120 depending on his night time snacking habits. . Reviewed meal planning and how to find alternatives to snacks with higher carbohydrates.  . Patient is now injecting 35 units daily of Tresiba as monotherapy for his diabetes. His A1c has decreased from 10.2-->8.9 in 05/29/18.  Encouraged patient to continue his great progress.  Will apply for financial assistance for Tresiba and Friedman needles to aid patient in reach target A1c~7%.  Patient Self Care Activities:  . Self administers medications as prescribed . Attends all scheduled provider appointments . Calls pharmacy for medication refills . Performs ADL's independently . Performs IADL's independently . Calls provider office for new concerns or questions  Please see past updates related to this goal by clicking on the "Past Updates" button in the selected goal       . I would like to apply for financial assistance for  Gregory Aas (pt-stated)       Current Barriers:  . Financial Barriers  Pharmacist Clinical Goal(s):  [name] Over the next 30 days, patient will work with PharmD to address needs related to applying for financial assistance for Tresiba  Interventions: . Comprehensive medication review  performed. . Discussed plans with patient for ongoing care management follow up and provided patient with direct contact information for care management team . Complete application faxed to [employer] for assistance.  Patient requested Gregory Friedman.  Will follow up to check on status of application in 9-53 business days.  Patient Self Care Activities:  . Self administers medications as prescribed . Attends all scheduled provider appointments . Calls pharmacy for medication refills  Please see past updates related to this goal by clicking on the "Past Updates" button in the selected goal          Plan:   The care management team will reach out to the patient again over the next 7 days.   Regina Eck, PharmD, BCPS Clinical Pharmacist, [facility] [place]: [phone]

## 2018-07-23 NOTE — Patient Instructions (Signed)
Visit Information  Goals Addressed            This Visit's Progress     Patient Stated   . "I would like to talk to you about my diabetes" (pt-stated)       Current Barriers:  [name] Knowledge Deficits related to disease process and self health management for Diabetes  Nurse Case Manager Clinical Goal(s):  [name] Over the next 90 days, patient will demonstrate improved adherence to his prescribed treatment plan for Diabetes Management as evidenced by verbalizing understanding and adherence to monitoring CBGs, taking insulin as prescribed and improved adherence to following a Diabetic diet. 06/10/18: Goal date revised to 90 days due to delays in treatment secondary to COVID-19.   CCM RN CM Interventions:   Telephone CCM follow up completed with patient today  . Evaluation of current treatment plan related to Diabetes and patient's adherence to plan as established by provider. . Discussed patient received and reviewed the mailed diabetes printed ed mats-he denies questions . Discussed additional printed ed mats to be mailed related Meal Planning using the Plate Method    Reviewed and discussed adherence to taking prescribed insulin; pt noted he switched from taking his insulin from the pm to the am and is noticing his BS are within normal limits  Discussed plans with patient for ongoing care management follow up and provided patient with direct contact information for care management team  Scheduled a CCM telephone follow up with patient in about 2 weeks  Sent pharmacy referral to [place] [facility] to further evaluate insulin regimen-pt aware  CCM PharmD Interventions: . Face to Face meeting with patient completed today 07/22/18. [name] Comprehensive medication review performed and list updated in the EMR . Patient is tolerating therapy well and denies adverse events. . His FBG this AM was 120.  He denies hypoglycemia (Bg<70).  He reports that most of his FBGs range from 90-120 depending on his  night time snacking habits. . Reviewed meal planning and how to find alternatives to snacks with higher carbohydrates.  . Patient is now injecting 35 units daily of Tresiba as monotherapy for his diabetes. His A1c has decreased from 10.2-->8.9 in 05/29/18.  Encouraged patient to continue his great progress.  Will apply for financial assistance for Tresiba and pen needles to aid patient in reach target A1c~7%.  Patient Self Care Activities:  . Self administers medications as prescribed . Attends all scheduled provider appointments . Calls pharmacy for medication refills . Performs ADL's independently . Performs IADL's independently . Calls provider office for new concerns or questions  Please see past updates related to this goal by clicking on the "Past Updates" button in the selected goal       . I would like to apply for financial assistance for Tresiba (pt-stated)       Current Barriers:  . Financial Barriers  Pharmacist Clinical Goal(s):  [name] Over the next 30 days, patient will work with PharmD to address needs related to applying for financial assistance for Tresiba  Interventions: . Comprehensive medication review performed. . Discussed plans with patient for ongoing care management follow up and provided patient with direct contact information for care management team . Complete application faxed to [employer] for assistance.  Patient requested [name] Fle[employer] Pen.  Will follow up to check on status of application in 5-39 business days.  Patient Self Care Activities:  . Self administers medications as prescribed . Attends all scheduled provider appointments . Calls pharmacy  for medication refills  Please see past updates related to this goal by clicking on the "Past Updates" button in the selected goal         The patient verbalized understanding of instructions provided today and declined a print copy of patient instruction materials.   The care management team will  reach out to the patient again over the next 7 days.   Regina Eck, PharmD, BCPS Clinical Pharmacist, [facility] [place]: [phone]

## 2018-07-24 ENCOUNTER — Other Ambulatory Visit: Payer: Self-pay | Admitting: Pharmacy Technician

## 2018-07-24 NOTE — Patient Outreach (Signed)
[facility]) Care Management  07/24/2018  Johnothan Bascomb 1950/12/21 [id number]   Received updated application from [place] for [name]. Faxed into [employer], will follow up in 3-5 business days.  Maud Deed Chana Bode [place] Certified Pharmacy Technician [facility] Direct Dial:[phone]

## 2018-07-25 ENCOUNTER — Other Ambulatory Visit: Payer: Self-pay

## 2018-07-25 NOTE — Telephone Encounter (Signed)
Spoke with pharm about rx being sent

## 2018-07-29 ENCOUNTER — Telehealth: Payer: Medicare HMO

## 2018-07-30 ENCOUNTER — Ambulatory Visit: Payer: Self-pay

## 2018-07-30 ENCOUNTER — Telehealth: Payer: Medicare HMO

## 2018-07-30 DIAGNOSIS — E785 Hyperlipidemia, unspecified: Secondary | ICD-10-CM

## 2018-07-30 DIAGNOSIS — E1165 Type 2 diabetes mellitus with hyperglycemia: Secondary | ICD-10-CM

## 2018-07-30 DIAGNOSIS — I10 Essential (primary) hypertension: Secondary | ICD-10-CM

## 2018-07-30 NOTE — Chronic Care Management (AMB) (Signed)
  Chronic Care Management   Outreach Note  07/30/2018 Name: Gregory Friedman MRN: [id number] DOB: Jul 20, 1950  Referred by: Shelby Mattocks, PA-C Reason for referral : Chronic Care Management (CCM RNCM Telephone follow up )   An unsuccessful telephone outreach was attempted today. The patient was referred to the case management team by Laurita Quint for assistance with chronic care management and care coordination.   Follow Up Plan: The care management team will reach out to the patient again over the next 7-14 days.   Barb Merino, RN, BSN, CCM Care Management Coordinator [facility]  Direct Phone: [phone]

## 2018-07-31 ENCOUNTER — Other Ambulatory Visit: Payer: Self-pay | Admitting: Pharmacy Technician

## 2018-07-31 NOTE — Patient Outreach (Signed)
[facility]) Care Management  07/31/2018  Gregory Friedman 1950-02-21 [id number]    Follow up call placed to [place] regarding patient assistance application(s) for Gregory Friedman confirms paitent has been approved as of 7/6 until 01/23/19. Order # [id number], medication to arrive to office in 1-14 business days.  Follow up:  Will route note to [place] to inform.  Maud Deed Chana Bode [place] Certified Pharmacy Technician [facility] [facility] Direct Dial:[phone]

## 2018-08-01 ENCOUNTER — Telehealth: Payer: Self-pay

## 2018-08-05 ENCOUNTER — Other Ambulatory Visit: Payer: Self-pay

## 2018-08-05 ENCOUNTER — Ambulatory Visit: Payer: Medicare HMO | Admitting: Pharmacist

## 2018-08-05 DIAGNOSIS — E785 Hyperlipidemia, unspecified: Secondary | ICD-10-CM

## 2018-08-05 DIAGNOSIS — I10 Essential (primary) hypertension: Secondary | ICD-10-CM

## 2018-08-05 DIAGNOSIS — E1165 Type 2 diabetes mellitus with hyperglycemia: Secondary | ICD-10-CM

## 2018-08-05 MED ORDER — SIMVASTATIN 40 MG PO TABS: 40.0000 mg | ORAL_TABLET | Freq: Every evening | ORAL | 1 refills | Status: DC

## 2018-08-05 NOTE — Patient Instructions (Signed)
Visit Information  Goals Addressed            This Visit's Progress     Patient Stated   . "I would like to talk to you about my diabetes" (pt-stated)       Current Barriers:  [name] Knowledge Deficits related to disease process and self health management for Diabetes  Nurse Case Manager Clinical Goal(s):  [name] Over the next 90 days, patient will demonstrate improved adherence to his prescribed treatment plan for Diabetes Management as evidenced by verbalizing understanding and adherence to monitoring CBGs, taking insulin as prescribed and improved adherence to following a Diabetic diet. 06/10/18: Goal date revised to 90 days due to delays in treatment secondary to COVID-19.   CCM RN CM Interventions:   Telephone CCM follow up completed with patient today  . Evaluation of current treatment plan related to Diabetes and patient's adherence to plan as established by provider. . Discussed patient received and reviewed the mailed diabetes printed ed mats-he denies questions . Discussed additional printed ed mats to be mailed related Meal Planning using the Plate Method    Reviewed and discussed adherence to taking prescribed insulin; pt noted he switched from taking his insulin from the pm to the am and is noticing his BS are within normal limits  Discussed plans with patient for ongoing care management follow up and provided patient with direct contact information for care management team  Scheduled a CCM telephone follow up with patient in about 2 weeks  Sent pharmacy referral to [place] [facility] to further evaluate insulin regimen-pt aware  CCM PharmD Interventions: . Telephone CCM follow up completed with patient today 08/05/18. [name] Comprehensive medication review performed and list updated in the EMR.  No changes to current regimen . Patient is tolerating therapy well and denies adverse events. . His FBG this AM was 112.  He denies hypoglycemia (BG<70).  He reports that most of his FBGs  range from 90-120 depending on his night time snacking habits.   . Reviewed meal planning and how to find alternatives to snacks with higher carbohydrates.  He states he has decreased his ice cream and potato chip intake. . Patient continues to inject 35 units daily of Tresiba as monotherapy for his diabetes.  He injects [place] in the mornings and this has proven to work well with his schedule.  His Bgs have responded well to this.  His A1c has decreased from 10.2-->8.9 in 05/29/18.  Encouraged patient to continue his great progress.   . Patient has been approved for free Tresiba through [employer] patient assistance program until 01/23/19.  This will help him to achieve target A1c~7%.  Patient Self Care Activities:  . Self administers medications as prescribed . Attends all scheduled provider appointments . Calls pharmacy for medication refills . Performs ADL's independently . Performs IADL's independently . Calls provider office for new concerns or questions  Please see past updates related to this goal by clicking on the "Past Updates" button in the selected goal       . I would like to apply for financial assistance for Tresiba (pt-stated)       Current Barriers:  . Financial Barriers  Pharmacist Clinical Goal(s):  [name] Over the next 30 days, patient will work with PharmD to address needs related to applying for financial assistance for Tresiba  Interventions: . Comprehensive medication review performed. . Discussed plans with patient for ongoing care management follow up and provided patient with direct contact  information for care management team . Patient has been approved for free Tresiba/pen needles through [employer] patient assistance program until 01/23/19.  Last refill to be called in 12/23/2018.  . Will close goal once medication has been delivered and picked up by patient.  Patient Self Care Activities:  . Self administers medications as prescribed . Attends all  scheduled provider appointments . Calls pharmacy for medication refills  Please see past updates related to this goal by clicking on the "Past Updates" button in the selected goal         The patient verbalized understanding of instructions provided today and declined a print copy of patient instruction materials.   The care management team will reach out to the patient again next month.  Regina Eck, PharmD, BCPS Clinical Pharmacist, [facility] [place]: [phone]

## 2018-08-05 NOTE — Progress Notes (Signed)
Chronic Care Management   Visit Note  08/05/2018 Name: Gregory Friedman MRN: [id number] DOB: 09-20-1950  Referred by: Gregory Mattocks, PA-C Reason for referral : Chronic Care Management   Gregory Friedman is a 68 y.o. year old male who is a primary care patient of [place], Gregory Friedman, [place]. The CCM team was consulted for assistance with chronic disease management and care coordination needs.   Review of patient status, including review of consultants reports, relevant laboratory and other test results, and collaboration with appropriate care team members and the patient's provider was performed as part of comprehensive patient evaluation and provision of chronic care management services.    I spoke with Gregory Friedman by telephone today  Objective:   Goals Addressed            This Visit's Progress     Patient Stated   . "I would like to talk to you about my diabetes" (pt-stated)       Current Barriers:  [name] Knowledge Deficits related to disease process and self health management for Diabetes  Nurse Case Manager & PharmD Clinical Goal(s):  [name] Over the next 90 days, patient will demonstrate improved adherence to his prescribed treatment plan for Diabetes Management as evidenced by verbalizing understanding and adherence to monitoring CBGs, taking insulin as prescribed and improved adherence to following a Diabetic diet. 06/10/18: Goal date revised to 90 days due to delays in treatment secondary to COVID-19.   CCM RN CM Interventions:   Telephone CCM follow up completed with patient today  . Evaluation of current treatment plan related to Diabetes and patient's adherence to plan as established by provider. . Discussed patient received and reviewed the mailed diabetes printed ed mats-he denies questions . Discussed additional printed ed mats to be mailed related Meal Planning using the Plate Method    Reviewed and discussed adherence to taking prescribed  insulin; pt noted he switched from taking his insulin from the pm to the am and is noticing his BS are within normal limits  Discussed plans with patient for ongoing care management follow up and provided patient with direct contact information for care management team  Scheduled a CCM telephone follow up with patient in about 2 weeks  Sent pharmacy referral to [facility] to further evaluate insulin regimen-pt aware  CCM PharmD Interventions: . Telephone CCM follow up completed with patient today 08/05/18. [name] Comprehensive medication review performed and list updated in the EMR.  No changes to current regimen . Patient is tolerating therapy well and denies adverse events. . His FBG this AM was 112.  He denies hypoglycemia (BG<70).  He reports that most of his FBGs range from 90-120 depending on his night time snacking habits.   . Reviewed meal planning and how to find alternatives to snacks with higher carbohydrates.  He states he has decreased his ice cream and potato chip intake. . Patient continues to inject 35 units daily of Tresiba as monotherapy for his diabetes.  He injects [place] in the mornings and this has proven to work well with his schedule.  His Bgs have responded well to this.  His A1c has decreased from 10.2-->8.9 in 05/29/18.  Encouraged patient to continue his great progress.   . Patient has been approved for free Tresiba through [employer] patient assistance program until 01/23/19.  This will help him to achieve target A1c~7%.  Patient Self Care Activities:  . Self administers medications as prescribed . Attends all scheduled provider appointments . Calls  pharmacy for medication refills . Performs ADL's independently . Performs IADL's independently . Calls provider office for new concerns or questions  Please see past updates related to this goal by clicking on the "Past Updates" button in the selected goal           Plan:   The care management team will  reach out to the patient again next month.   Gregory Friedman, PharmD, BCPS Clinical Pharmacist, [facility] [place]: [phone]

## 2018-08-08 ENCOUNTER — Encounter: Payer: Self-pay | Admitting: Internal Medicine

## 2018-08-08 ENCOUNTER — Other Ambulatory Visit: Payer: Self-pay

## 2018-08-08 ENCOUNTER — Ambulatory Visit (INDEPENDENT_AMBULATORY_CARE_PROVIDER_SITE_OTHER): Payer: Medicare HMO | Admitting: Internal Medicine

## 2018-08-08 VITALS — BP 130/64 | HR 50 | Temp 98.1°F | Ht 66.6 in | Wt 193.0 lb

## 2018-08-08 DIAGNOSIS — E1165 Type 2 diabetes mellitus with hyperglycemia: Secondary | ICD-10-CM | POA: Diagnosis not present

## 2018-08-08 DIAGNOSIS — I10 Essential (primary) hypertension: Secondary | ICD-10-CM

## 2018-08-08 DIAGNOSIS — R399 Unspecified symptoms and signs involving the genitourinary system: Secondary | ICD-10-CM | POA: Diagnosis not present

## 2018-08-08 DIAGNOSIS — R1030 Lower abdominal pain, unspecified: Secondary | ICD-10-CM

## 2018-08-08 LAB — POCT URINALYSIS DIPSTICK
Bilirubin, UA: NEGATIVE
Blood, UA: NEGATIVE
Glucose, UA: NEGATIVE
Ketones, UA: NEGATIVE
Leukocytes, UA: NEGATIVE
Nitrite, UA: NEGATIVE
Protein, UA: NEGATIVE
Spec Grav, UA: 1.025 (ref 1.010–1.025)
Urobilinogen, UA: 0.2 E.U./dL
pH, UA: 7 (ref 5.0–8.0)

## 2018-08-08 MED ORDER — METRONIDAZOLE 500 MG PO TABS: 500.0000 mg | ORAL_TABLET | Freq: Three times a day (TID) | ORAL | 0 refills | Status: AC

## 2018-08-08 MED ORDER — CIPROFLOXACIN HCL 500 MG PO TABS: 500.0000 mg | ORAL_TABLET | Freq: Two times a day (BID) | ORAL | 0 refills | Status: AC

## 2018-08-08 NOTE — Progress Notes (Signed)
Subjective:     Patient ID: Gregory Friedman , male    DOB: 1950/03/02 , 68 y.o.   MRN: [id number]   Chief Complaint  Patient presents with  . Diabetes   His fasting glucose have been 90-120 while on the Tresiba 200/ml, but when he was given samples of the 100, they rose his post prandial glucose this week to 204-211. He denies eating sweets or breads. He does not think he has been eating anything different.  He exercises on his stationary bike for 30 minutes every day.   He cant tolerate doing sit up since his lower abdomen hurts him L>R. Pain is similar to diverticulitis pain, but not as severe. Has not had fever, diarrhea, constipation or blood in stool. Has been having urgency and frequency. Has been off most of his supplements this past week, specially saw pameto.   Past Medical History:  Diagnosis Date  . Anxiety   . BPH (benign prostatic hyperplasia)   . Cancer ([place]) 2014   neck area  . Colon polyps   . Coronary artery disease   . Depression   . Diverticulosis   . GERD (gastroesophageal reflux disease)   . History of gout    toe  . 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 Hyperlipidemia   . Hypertension    states under control with med., has been on med. since 2003  . 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 Myocardial infarction ([place]) 2003  . Non-insulin dependent type 2 diabetes mellitus ([place])   . PONV (postoperative nausea and vomiting)   . S/P radiation therapy    Received 8 fractions, then stopped due to mucositis with Radiation/ Cisplatin  . Status post dilation of esophageal narrowing   . Trigger thumb of right hand 01/2016     Family History  Problem Relation Age of Onset  . Stroke Sister   . Diabetes Brother   . Seizures Mother   . Asthma Father   . Thyroid cancer Daughter   . Diabetes Sister   . Parkinson's disease Brother   . Heart disease Brother   . Diabetes  Brother   . Colon cancer Neg Hx      Current Outpatient Medications:  .  Accu-Chek FastClix Lancets MISC, CHECK BLOOD SUGAR ONE TIME DAILY AS DIRECTED, Disp: 150 each, Rfl: 2 .  antiseptic oral rinse (BIOTENE) LIQD, 15 mLs by Mouth Rinse route daily as needed for dry mouth. , Disp: , Rfl:  .  aspirin EC 81 MG tablet, Take 81 mg by mouth daily., Disp: , Rfl:  .  co-enzyme Q-10 30 MG capsule, Take 30 mg by mouth daily., Disp: , Rfl:  .  enalapril (VASOTEC) 10 MG tablet, TAKE 1 TABLET EVERY DAY, Disp: 90 tablet, Rfl: 1 .  glucose blood (ACCU-CHEK GUIDE) test strip, 1 each by Other route 2 (two) times daily. Use as instructed, Disp: 200 each, Rfl: 12 .  insulin degludec (TRESIBA FLEXTOUCH) 100 UNIT/ML SOPN FlexTouch Pen, inject 30  Units subcutaneously daily at bedtime (Patient taking differently: 35 Units daily. inject 35  Units subcutaneously daily at bedtime), Disp: 15 pen, Rfl: 3 .  metoprolol succinate (TOPROL-XL) 25 MG 24 hr tablet, TAKE 1 TABLET EVERY DAY, Disp: 90 tablet, Rfl: 0 .  Multiple Vitamins-Minerals (MULTIVITAMIN WITH MINERALS) tablet, Take 1 tablet by mouth 2 (two) times daily. , Disp: , Rfl:  .  nitroGLYCERIN (NITROSTAT) 0.4 MG SL tablet, Place 1 tablet (0.4 mg total) under the tongue every 5 (five) minutes as needed. Chest pain, Disp: 25 tablet, Rfl: 3 .  omeprazole (PRILOSEC) 40 MG capsule, TAKE 1 CAPSULE EVERY DAY BEFORE A MEAL (Patient taking differently: 40 mg daily as needed. ), Disp: 90 capsule, Rfl: 0 .  Saw [place], Serenoa repens, 320 MG CAPS, Take 1 capsule by mouth daily. , Disp: , Rfl:  .  simvastatin (ZOCOR) 40 MG tablet, Take 1 tablet (40 mg total) by mouth every evening., Disp: 90 tablet, Rfl: 1   Allergies  Allergen Reactions  . Shellfish Allergy Swelling    In toes     Review of Systems  + for urgency, frequency, lower abdominal pain. Neg for fever, chills, sweats, CP, edema, constipation or diarrhea or SOB.  Today's Vitals   08/08/18 1141  BP: 130/64   Pulse: (!) 50  Temp: 98.1 F (36.7 C)  TempSrc: Oral  SpO2: 100%  Weight: 193 lb (87.5 kg)  Height: 5' 6.6" (1.692 m)   Body mass index is 30.59 kg/m.   Objective:  Physical Exam  Constitutional: he is oriented to person, place, and time. he appears well-developed and well-nourished. No distress.  HENT:  Head: Normocephalic and atraumatic.  Right Ear: External ear normal.  Left Ear: External ear normal.  Nose: Nose normal.  Eyes: Conjunctivae are normal. Right eye exhibits no discharge. Left eye exhibits no discharge. No scleral icterus.  Neck: Neck supple. No thyromegaly present.  No carotid bruits bilaterally  Cardiovascular: Normal rate and regular rhythm.  No murmur heard. Pulmonary/Chest: Effort normal and breath sounds normal. No respiratory distress.  Abdomen- + soft, has moderate tenderness on LLQ and milder on RLQ and LUQ. No rebound or guarding.  Musculoskeletal: Normal range of motion. he exhibits no edema.  Lymphadenopathy: he has no cervical adenopathy.  Neurological: he is alert and oriented to person, place, and time.  Skin: Skin is warm and dry. Capillary refill takes less than 2 seconds. No rash noted. he is not diaphoretic.  Psychiatric: he has a normal mood and affect. Her behavior is normal. Judgment and thought content normal.  Nursing note reviewed. UA- negative. Assessment And Plan:    1. Lower abdominal pain- possibly early diverticulitis.  - CBC with Diff - POCT Urinalysis Dipstick (81002) I went ahead and placed him on Flagyl and Cipro. If pain resolved with this he can cancel the CT of abdomen.  2. Uncontrolled type 2 diabetes mellitus with hyperglycemia ([place])- not as well controlled this week.  - Hemoglobin A1c  3. Essential hypertension- stable - CMP14 + Anion Gap  4. Abnormal renal finding- chronic. I am trying to see if the supplements are causing him to have elevation of his creatinine.  - CMP14 + Anion Gap  FU in month.  He picked up  his Gregory Friedman til 12/2018.  Gregory Kley RODRIGUEZ-SOUTHWORTH, PA-C    THE PATIENT IS ENCOURAGED TO PRACTICE SOCIAL DISTANCING DUE TO THE COVID-19 PANDEMIC.

## 2018-08-09 LAB — CMP14 + ANION GAP
ALT: 14 IU/L (ref 0–44)
AST: 16 IU/L (ref 0–40)
Albumin/Globulin Ratio: 1.6 (ref 1.2–2.2)
Albumin: 4.6 g/dL (ref 3.8–4.8)
Alkaline Phosphatase: 55 IU/L (ref 39–117)
Anion Gap: 15 mmol/L (ref 10.0–18.0)
BUN/Creatinine Ratio: 11 (ref 10–24)
BUN: 18 mg/dL (ref 8–27)
Bilirubin Total: 0.5 mg/dL (ref 0.0–1.2)
CO2: 25 mmol/L (ref 20–29)
Calcium: 9.6 mg/dL (ref 8.6–10.2)
Chloride: 101 mmol/L (ref 96–106)
Creatinine, Ser: 1.58 mg/dL — ABNORMAL HIGH (ref 0.76–1.27)
GFR calc Af Amer: 51 mL/min/{1.73_m2} — ABNORMAL LOW (ref >59)
GFR calc non Af Amer: 44 mL/min/{1.73_m2} — ABNORMAL LOW (ref >59)
Globulin, Total: 2.9 g/dL (ref 1.5–4.5)
Glucose: 131 mg/dL — ABNORMAL HIGH (ref 65–99)
Potassium: 4.2 mmol/L (ref 3.5–5.2)
Sodium: 141 mmol/L (ref 134–144)
Total Protein: 7.5 g/dL (ref 6.0–8.5)

## 2018-08-09 LAB — CBC WITH DIFFERENTIAL/PLATELET
Basophils Absolute: 0.1 10*3/uL (ref 0.0–0.2)
Basos: 1 %
EOS (ABSOLUTE): 0.5 10*3/uL — ABNORMAL HIGH (ref 0.0–0.4)
Eos: 9 %
Hematocrit: 41.6 % (ref 37.5–51.0)
Hemoglobin: 14 g/dL (ref 13.0–17.7)
Immature Grans (Abs): 0 10*3/uL (ref 0.0–0.1)
Immature Granulocytes: 1 %
Lymphocytes Absolute: 1.6 10*3/uL (ref 0.7–3.1)
Lymphs: 28 %
MCH: 28 pg (ref 26.6–33.0)
MCHC: 33.7 g/dL (ref 31.5–35.7)
MCV: 83 fL (ref 79–97)
Monocytes Absolute: 0.5 10*3/uL (ref 0.1–0.9)
Monocytes: 8 %
Neutrophils Absolute: 2.9 10*3/uL (ref 1.4–7.0)
Neutrophils: 53 %
Platelets: 180 10*3/uL (ref 150–450)
RBC: 5 x10E6/uL (ref 4.14–5.80)
RDW: 13.1 % (ref 11.6–15.4)
WBC: 5.6 10*3/uL (ref 3.4–10.8)

## 2018-08-09 LAB — HEMOGLOBIN A1C
Est. average glucose Bld gHb Est-mCnc: 189 mg/dL
Hgb A1c MFr Bld: 8.2 % — ABNORMAL HIGH (ref 4.8–5.6)

## 2018-08-12 ENCOUNTER — Telehealth: Payer: Self-pay

## 2018-08-13 ENCOUNTER — Ambulatory Visit: Payer: Self-pay

## 2018-08-13 DIAGNOSIS — F5101 Primary insomnia: Secondary | ICD-10-CM

## 2018-08-13 DIAGNOSIS — F3289 Other specified depressive episodes: Secondary | ICD-10-CM

## 2018-08-13 DIAGNOSIS — E1165 Type 2 diabetes mellitus with hyperglycemia: Secondary | ICD-10-CM

## 2018-08-13 NOTE — Chronic Care Management (AMB) (Signed)
  Chronic Care Management   Outreach Note  08/13/2018 Name: Gregory Friedman MRN: [id number] DOB: 02-26-1950  Referred by: Shelby Mattocks, PA-C Reason for referral : Chronic Care Management (CCM RNCM Telephone Follow up )   A second unsuccessful telephone outreach was attempted today. The patient was referred to the case management team for assistance with chronic care management and care coordination.   Follow Up Plan: Telephone follow up appointment with care management team member scheduled for: 09/04/18   Barb Merino, RN, BSN, CCM Care Management Coordinator [facility]  Direct Phone: [phone]

## 2018-08-29 ENCOUNTER — Ambulatory Visit: Payer: Medicare HMO | Admitting: Pharmacist

## 2018-08-30 ENCOUNTER — Ambulatory Visit (INDEPENDENT_AMBULATORY_CARE_PROVIDER_SITE_OTHER): Payer: Medicare HMO | Admitting: Pharmacist

## 2018-08-30 DIAGNOSIS — E1165 Type 2 diabetes mellitus with hyperglycemia: Secondary | ICD-10-CM

## 2018-08-30 DIAGNOSIS — I10 Essential (primary) hypertension: Secondary | ICD-10-CM | POA: Diagnosis not present

## 2018-08-30 NOTE — Progress Notes (Signed)
  Chronic Care Management   Outreach Note  08/30/2018 Name: Gregory Friedman MRN: [id number] DOB: [birth year]  Referred by: Shelby Mattocks, PA-C Reason for referral : Chronic Care Management   An unsuccessful telephone outreach was attempted today. The patient was referred to the case management team by for assistance with chronic care management and care coordination.   Follow Up Plan: A HIPPA compliant phone message was left for the patient providing contact information and requesting a return call.  The care management team will reach out to the patient again over the next 5-7 business days.   Regina Eck, PharmD, BCPS Clinical Pharmacist, [facility] [place]: [phone]

## 2018-09-02 NOTE — Progress Notes (Signed)
Chronic Care Management   Visit Note  08/30/2018 Name: Gregory Friedman MRN: [id number] DOB: 02-24-1950  Referred by: Shelby Mattocks, PA-C Reason for referral : Chronic Care Management   Kartik [name] is a 68 y.o. year old male who is a primary care patient of [place], Sunday Spill[place], [place]. The CCM team was consulted for assistance with chronic disease management and care coordination needs.   Review of patient status, including review of consultants reports, relevant laboratory and other test results, and collaboration with appropriate care team members and the patient's provider was performed as part of comprehensive patient evaluation and provision of chronic care management services.    I spoke with Mr. Capp by telephone today.  Objective:   Goals Addressed            This Visit's Progress     Patient Stated   . "I would like to talk to you about my diabetes" (pt-stated)       Current Barriers:  [name] Knowledge Deficits related to disease process and self health management for Diabetes  Nurse Case Manager Clinical Goal(s):  [name] Over the next 90 days, patient will demonstrate improved adherence to his prescribed treatment plan for Diabetes Management as evidenced by verbalizing understanding and adherence to monitoring CBGs, taking insulin as prescribed and improved adherence to following a Diabetic diet. 08/30/18: Goal date revised to 90 days due to delays in treatment secondary to COVID-19.   CCM RN CM Interventions:   Telephone CCM follow up completed with patient today  . Evaluation of current treatment plan related to Diabetes and patient's adherence to plan as established by provider. . Discussed patient received and reviewed the mailed diabetes printed ed mats-he denies questions . Discussed additional printed ed mats to be mailed related Meal Planning using the Plate Method    Reviewed and discussed adherence to taking prescribed insulin; pt  noted he switched from taking his insulin from the pm to the am and is noticing his BS are within normal limits  Discussed plans with patient for ongoing care management follow up and provided patient with direct contact information for care management team  Scheduled a CCM telephone follow up with patient in about 2 weeks  Sent pharmacy referral to [facility] to further evaluate insulin regimen-pt aware  CCM PharmD Interventions: Telephone CCM follow up completed with patient today 08/30/18 . Comprehensive medication review performed and list updated in the EMR.  No changes to current regimen . Patient is tolerating therapy well and denies adverse events. . His FBG this AM was 98.  His A1c is likely at goal based on his new FBG in the 90s.  He denies hypoglycemia (BG<70).  He reports that most of his FBGs range from 90s depending on his night time snacking habits.   . Reviewed meal planning and how to find alternatives to snacks with higher carbohydrates.  He states he has decreased his ice cream and potato chip intake.  He is more mindful of snacking and is avoiding foods with higher carbohydrate intake. . Patient continues to inject 35 units daily of Tresiba as monotherapy for his diabetes.  He injects [place] in the mornings and this has proven to work well with his schedule.  His Bgs have responded well to this.  His A1c has decreased from 10.2-->8.9 in 05/29/18.  Encouraged patient to continue his great progress.  His repeat A1c is due at office visit on 09/12/18. [name] Patient has been approved for free [place] through  [employer] patient assistance program until 01/23/19.  This will help him to achieve target A1c~7%.  Patient Self Care Activities:  . Self administers medications as prescribed . Attends all scheduled provider appointments . Calls pharmacy for medication refills . Performs ADL's independently . Performs IADL's independently . Calls provider office for new concerns or  questions  Please see past updates related to this goal by clicking on the "Past Updates" button in the selected goal       . COMPLETED: I would like to apply for financial assistance for Tresiba (pt-stated)       Current Barriers:  . Financial Barriers  Pharmacist Clinical Goal(s):  [name] Over the next 30 days, patient will work with PharmD to address needs related to applying for financial assistance for [name]  goal completed  Interventions: . Comprehensive medication review performed. . Discussed plans with patient for ongoing care management follow up and provided patient with direct contact information for care management team . Patient has been approved for free Tresiba/pen needles through [employer] patient assistance program until 01/23/19.  Last refill to be called in 12/23/2018.   Call placed to [employer] to confirm. . Will close goal once medication has been delivered and picked up by patient.  Patient Self Care Activities:  . Self administers medications as prescribed . Attends all scheduled provider appointments . Calls pharmacy for medication refills  Please see past updates related to this goal by clicking on the "Past Updates" button in the selected goal          Plan:   The care management team will reach out to the patient again over the next 4 weeks.  Regina Eck, PharmD, BCPS Clinical Pharmacist, [facility] [place]: [phone]

## 2018-09-02 NOTE — Patient Instructions (Signed)
Visit Information  Goals Addressed            This Visit's Progress     Patient Stated   . "I would like to talk to you about my diabetes" (pt-stated)       Current Barriers:  Gregory Friedman Knowledge Deficits related to disease process and self health management for Diabetes  Nurse Case Manager Clinical Goal(s):  Gregory Friedman Over the next 90 days, patient will demonstrate improved adherence to his prescribed treatment plan for Diabetes Management as evidenced by verbalizing understanding and adherence to monitoring CBGs, taking insulin as prescribed and improved adherence to following a Diabetic diet. 08/30/18: Goal date revised to 90 days due to delays in treatment secondary to COVID-19.   CCM RN CM Interventions:   Telephone CCM follow up completed with patient today  . Evaluation of current treatment plan related to Diabetes and patient's adherence to plan as established by provider. . Discussed patient received and reviewed the mailed diabetes printed ed mats-he denies questions . Discussed additional printed ed mats to be mailed related Meal Planning using the Plate Method    Reviewed and discussed adherence to taking prescribed insulin; pt noted he switched from taking his insulin from the pm to the am and is noticing his BS are within normal limits  Discussed plans with patient for ongoing care management follow up and provided patient with direct contact information for care management team  Scheduled a CCM telephone follow up with patient in about 2 weeks  Sent pharmacy referral to [facility] to further evaluate insulin regimen-pt aware  CCM PharmD Interventions: Telephone CCM follow up completed with patient today 08/30/18 . Comprehensive medication review performed and list updated in the EMR.  No changes to current regimen . Patient is tolerating therapy well and denies adverse events. . His FBG this AM was 98.  His A1c is likely at goal based on his new FBG in the 90s.  He denies  hypoglycemia (BG<70).  He reports that most of his FBGs range from 90s depending on his night time snacking habits.   . Reviewed meal planning and how to find alternatives to snacks with higher carbohydrates.  He states he has decreased his ice cream and potato chip intake.  He is more mindful of snacking and is avoiding foods with higher carbohydrate intake. . Patient continues to inject 35 units daily of Tresiba as monotherapy for his diabetes.  He injects [place] in the mornings and this has proven to work well with his schedule.  His Bgs have responded well to this.  His A1c has decreased from 10.2-->8.9 in 05/29/18.  Encouraged patient to continue his great progress.  His repeat A1c is due at office visit on 09/12/18. Gregory Friedman Patient has been approved for free Tresiba through [employer] patient assistance program until 01/23/19.  This will help him to achieve target A1c~7%.  Patient Self Care Activities:  . Self administers medications as prescribed . Attends all scheduled provider appointments . Calls pharmacy for medication refills . Performs ADL's independently . Performs IADL's independently . Calls provider office for new concerns or questions  Please see past updates related to this goal by clicking on the "Past Updates" button in the selected goal       . COMPLETED: I would like to apply for financial assistance for Tresiba (pt-stated)       Current Barriers:  . Financial Barriers  Pharmacist Clinical Goal(s):  Gregory Friedman Over the next 30 days, patient  will work with PharmD to address needs related to applying for financial assistance for [name]  goal completed  Interventions: . Comprehensive medication review performed. . Discussed plans with patient for ongoing care management follow up and provided patient with direct contact information for care management team . Patient has been approved for free Tresiba/pen needles through [employer] patient assistance program until 01/23/19.  Last  refill to be called in 12/23/2018.   Call placed to [employer] to confirm. . Will close goal once medication has been delivered and picked up by patient.  Patient Self Care Activities:  . Self administers medications as prescribed . Attends all scheduled provider appointments . Calls pharmacy for medication refills  Please see past updates related to this goal by clicking on the "Past Updates" button in the selected goal         The patient verbalized understanding of instructions provided today and declined a print copy of patient instruction materials.   The care management team will reach out to the patient again over the next 4 weeks.  Regina Eck, PharmD, BCPS Clinical Pharmacist, [facility] [place]: [phone]

## 2018-09-04 ENCOUNTER — Telehealth: Payer: Medicare HMO

## 2018-09-05 ENCOUNTER — Telehealth: Payer: Self-pay

## 2018-09-12 ENCOUNTER — Ambulatory Visit (INDEPENDENT_AMBULATORY_CARE_PROVIDER_SITE_OTHER): Payer: Medicare HMO | Admitting: Internal Medicine

## 2018-09-12 ENCOUNTER — Other Ambulatory Visit: Payer: Self-pay

## 2018-09-12 ENCOUNTER — Encounter: Payer: Self-pay | Admitting: Internal Medicine

## 2018-09-12 ENCOUNTER — Ambulatory Visit (INDEPENDENT_AMBULATORY_CARE_PROVIDER_SITE_OTHER): Payer: Medicare HMO

## 2018-09-12 VITALS — BP 148/78 | HR 57 | Temp 98.2°F | Resp 94 | Ht 65.8 in | Wt 193.4 lb

## 2018-09-12 VITALS — BP 148/78 | HR 57 | Temp 98.2°F | Ht 65.8 in | Wt 193.4 lb

## 2018-09-12 DIAGNOSIS — Z0000 Encounter for general adult medical examination without abnormal findings: Secondary | ICD-10-CM | POA: Diagnosis not present

## 2018-09-12 DIAGNOSIS — F325 Major depressive disorder, single episode, in full remission: Secondary | ICD-10-CM

## 2018-09-12 DIAGNOSIS — I10 Essential (primary) hypertension: Secondary | ICD-10-CM

## 2018-09-12 DIAGNOSIS — E119 Type 2 diabetes mellitus without complications: Secondary | ICD-10-CM

## 2018-09-12 DIAGNOSIS — C060 Malignant neoplasm of cheek mucosa: Secondary | ICD-10-CM

## 2018-09-12 DIAGNOSIS — Z794 Long term (current) use of insulin: Secondary | ICD-10-CM | POA: Diagnosis not present

## 2018-09-12 DIAGNOSIS — E1165 Type 2 diabetes mellitus with hyperglycemia: Secondary | ICD-10-CM

## 2018-09-12 DIAGNOSIS — R7989 Other specified abnormal findings of blood chemistry: Secondary | ICD-10-CM | POA: Insufficient documentation

## 2018-09-12 LAB — POCT URINALYSIS DIPSTICK
Bilirubin, UA: NEGATIVE
Glucose, UA: NEGATIVE
Ketones, UA: NEGATIVE
Leukocytes, UA: NEGATIVE
Nitrite, UA: NEGATIVE
Protein, UA: NEGATIVE
Spec Grav, UA: 1.015 (ref 1.010–1.025)
Urobilinogen, UA: 0.2 E.U./dL
pH, UA: 7 (ref 5.0–8.0)

## 2018-09-12 NOTE — Patient Instructions (Signed)
Gregory Friedman , Thank you for taking time to come for your Medicare Wellness Visit. I appreciate your ongoing commitment to your health goals. Please review the following plan we discussed and let me know if I can assist you in the future.   Screening recommendations/referrals: Colonoscopy: 05/2017 Recommended yearly ophthalmology/optometry visit for glaucoma screening and checkup Recommended yearly dental visit for hygiene and checkup  Vaccinations: Influenza vaccine: 10/2017 Pneumococcal vaccine: 12/2014 Tdap vaccine: 05/2009 Shingles vaccine: discussed    Advanced directives: Advance directive discussed with you today. Even though you declined this today please call our office should you change your mind and we can give you the proper paperwork for you to fill out.   Conditions/risks identified: obesity  Next appointment: 10/31/2018 at 10:30  Preventive Care 64 Years and Older, Male Preventive care refers to lifestyle choices and visits with your health care provider that can promote health and wellness. What does preventive care include?  A yearly physical exam. This is also called an annual well check.  Dental exams once or twice a year.  Routine eye exams. Ask your health care provider how often you should have your eyes checked.  Personal lifestyle choices, including:  Daily care of your teeth and gums.  Regular physical activity.  Eating a healthy diet.  Avoiding tobacco and drug use.  Limiting alcohol use.  Practicing safe sex.  Taking low doses of aspirin every day.  Taking vitamin and mineral supplements as recommended by your health care provider. What happens during an annual well check? The services and screenings done by your health care provider during your annual well check will depend on your age, overall health, lifestyle risk factors, and family history of disease. Counseling  Your health care provider may ask you questions about your:  Alcohol  use.  Tobacco use.  Drug use.  Emotional well-being.  Home and relationship well-being.  Sexual activity.  Eating habits.  History of falls.  Memory and ability to understand (cognition).  Work and work Statistician. Screening  You may have the following tests or measurements:  Height, weight, and BMI.  Blood pressure.  Lipid and cholesterol levels. These may be checked every 5 years, or more frequently if you are over 61 years old.  Skin check.  Lung cancer screening. You may have this screening every year starting at age 72 if you have a 30-pack-year history of smoking and currently smoke or have quit within the past 15 years.  Fecal occult blood test (FOBT) of the stool. You may have this test every year starting at age 61.  Flexible sigmoidoscopy or colonoscopy. You may have a sigmoidoscopy every 5 years or a colonoscopy every 10 years starting at age 71.  Prostate cancer screening. Recommendations will vary depending on your family history and other risks.  Hepatitis C blood test.  Hepatitis B blood test.  Sexually transmitted disease (STD) testing.  Diabetes screening. This is done by checking your blood sugar (glucose) after you have not eaten for a while (fasting). You may have this done every 1-3 years.  Abdominal aortic aneurysm (AAA) screening. You may need this if you are a current or former smoker.  Osteoporosis. You may be screened starting at age 66 if you are at high risk. Talk with your health care provider about your test results, treatment options, and if necessary, the need for more tests. Vaccines  Your health care provider may recommend certain vaccines, such as:  Influenza vaccine. This is recommended every year.  Tetanus, diphtheria, and acellular pertussis (Tdap, Td) vaccine. You may need a Td booster every 10 years.  Zoster vaccine. You may need this after age 31.  Pneumococcal 13-valent conjugate (PCV13) vaccine. One dose is  recommended after age 15.  Pneumococcal polysaccharide (PPSV23) vaccine. One dose is recommended after age 79. Talk to your health care provider about which screenings and vaccines you need and how often you need them. This information is not intended to replace advice given to you by your health care provider. Make sure you discuss any questions you have with your health care provider. Document Released: 02/05/2015 Document Revised: 09/29/2015 Document Reviewed: 11/10/2014 Elsevier Interactive Patient Education  2017 [place] Prevention in the Home Falls can cause injuries. They can happen to people of all ages. There are many things you can do to make your home safe and to help prevent falls. What can I do on the outside of my home?  Regularly fix the edges of walkways and driveways and fix any cracks.  Remove anything that might make you trip as you walk through a door, such as a raised step or threshold.  Trim any bushes or trees on the path to your home.  Use bright outdoor lighting.  Clear any walking paths of anything that might make someone trip, such as rocks or tools.  Regularly check to see if handrails are loose or broken. Make sure that both sides of any steps have handrails.  Any raised decks and porches should have guardrails on the edges.  Have any leaves, snow, or ice cleared regularly.  Use sand or salt on walking paths during winter.  Clean up any spills in your garage right away. This includes oil or grease spills. What can I do in the bathroom?  Use night lights.  Install grab bars by the toilet and in the tub and shower. Do not use towel bars as grab bars.  Use non-skid mats or decals in the tub or shower.  If you need to sit down in the shower, use a plastic, non-slip stool.  Keep the floor dry. Clean up any water that spills on the floor as soon as it happens.  Remove soap buildup in the tub or shower regularly.  Attach bath mats  securely with double-sided non-slip rug tape.  Do not have throw rugs and other things on the floor that can make you trip. What can I do in the bedroom?  Use night lights.  Make sure that you have a light by your bed that is easy to reach.  Do not use any sheets or blankets that are too big for your bed. They should not hang down onto the floor.  Have a firm chair that has side arms. You can use this for support while you get dressed.  Do not have throw rugs and other things on the floor that can make you trip. What can I do in the kitchen?  Clean up any spills right away.  Avoid walking on wet floors.  Keep items that you use a lot in easy-to-reach places.  If you need to reach something above you, use a strong step stool that has a grab bar.  Keep electrical cords out of the way.  Do not use floor polish or wax that makes floors slippery. If you must use wax, use non-skid floor wax.  Do not have throw rugs and other things on the floor that can make you trip. What can I do  with my stairs?  Do not leave any items on the stairs.  Make sure that there are handrails on both sides of the stairs and use them. Fix handrails that are broken or loose. Make sure that handrails are as long as the stairways.  Check any carpeting to make sure that it is firmly attached to the stairs. Fix any carpet that is loose or worn.  Avoid having throw rugs at the top or bottom of the stairs. If you do have throw rugs, attach them to the floor with carpet tape.  Make sure that you have a light switch at the top of the stairs and the bottom of the stairs. If you do not have them, ask someone to add them for you. What else can I do to help prevent falls?  Wear shoes that:  Do not have high heels.  Have rubber bottoms.  Are comfortable and fit you well.  Are closed at the toe. Do not wear sandals.  If you use a stepladder:  Make sure that it is fully opened. Do not climb a closed  stepladder.  Make sure that both sides of the stepladder are locked into place.  Ask someone to hold it for you, if possible.  Clearly mark and make sure that you can see:  Any grab bars or handrails.  First and last steps.  Where the edge of each step is.  Use tools that help you move around (mobility aids) if they are needed. These include:  Canes.  Walkers.  Scooters.  Crutches.  Turn on the lights when you go into a dark area. Replace any light bulbs as soon as they burn out.  Set up your furniture so you have a clear path. Avoid moving your furniture around.  If any of your floors are uneven, fix them.  If there are any pets around you, be aware of where they are.  Review your medicines with your doctor. Some medicines can make you feel dizzy. This can increase your chance of falling. Ask your doctor what other things that you can do to help prevent falls. This information is not intended to replace advice given to you by your health care provider. Make sure you discuss any questions you have with your health care provider. Document Released: 11/05/2008 Document Revised: 06/17/2015 Document Reviewed: 02/13/2014 Elsevier Interactive Patient Education  2017 [employer].

## 2018-09-12 NOTE — Progress Notes (Signed)
Subjective:   Yassine Brunsman is a 68 y.o. male who presents for Medicare Annual/Subsequent preventive examination.  Review of Systems:  n/a Cardiac Risk Factors include: advanced age (>73men, >59 women);diabetes mellitus;hypertension;male gender     Objective:    Vitals: BP (!) 148/78 (BP Location: Left Arm, Patient Position: Sitting, Cuff Size: Normal)   Pulse (!) 57   Temp 98.2 F (36.8 C) (Oral)   Ht 5' 5.8" (1.671 m)   Wt 193 lb 6.4 oz (87.7 kg)   SpO2 94%   BMI 31.41 kg/m   Body mass index is 31.41 kg/m.  Advanced Directives 09/12/2018 09/21/2017 09/21/2017 09/20/2017 02/24/2016 02/18/2016 07/15/2015  Does Patient Have a Medical Advance Directive? No No - No Yes Yes No  Type of Advance Directive - - - - Press photographer;Living will [place];Living will -  Does patient want to make changes to medical advance directive? - - - - No - Patient declined No - Patient declined -  Copy of [place] in Chart? - - - - No - copy requested - -  Would patient like information on creating a medical advance directive? - Yes (ED - Information included in AVS) Yes (ED - Information included in AVS) - - - -  Pre-existing out of facility DNR order (yellow form or pink MOST form) - - - - - - -    Tobacco Social History   Tobacco Use  Smoking Status Never Smoker  Smokeless Tobacco Never Used     Counseling given: Not Answered   Clinical Intake:  Pre-visit preparation completed: Yes  Pain : No/denies pain     Nutritional Status: BMI > 30  Obese Nutritional Risks: None Diabetes: Yes CBG done?: No Did pt. bring in CBG monitor from home?: No  How often do you need to have someone help you when you read instructions, pamphlets, or other written materials from your doctor or pharmacy?: 1 - Never What is the last grade level you completed in school?: 12th grade  Interpreter Needed?: No  Information entered by :: NAllen LPN  Past  Medical History:  Diagnosis Date  . Anxiety   . BPH (benign prostatic hyperplasia)   . Cancer ([place]) 2014   neck area  . Colon polyps   . Coronary artery disease   . Depression   . Diverticulosis   . GERD (gastroesophageal reflux disease)   . History of gout    toe  . 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 Hyperlipidemia   . Hypertension    states under control with med., has been on med. since 2003  . 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 Myocardial infarction ([place]) 2003  . Non-insulin dependent type 2 diabetes mellitus ([place])   . PONV (postoperative nausea and vomiting)   . S/P radiation therapy    Received 8 fractions, then stopped due to mucositis with Radiation/ Cisplatin  . Status post dilation of esophageal narrowing   . Trigger thumb of right hand 01/2016   Past Surgical History:  Procedure Laterality Date  . CARDIAC CATHETERIZATION  05/25/2010  . CARDIOVASCULAR STRESS TEST  07/2009  . COLONOSCOPY WITH PROPOFOL  02/16/2011  . CORONARY ANGIOPLASTY  2003; 2006   2 stents in heart  . CORONARY ARTERY BYPASS GRAFT  2005  . CYSTOSCOPY  02/09/2011   Procedure: CYSTOSCOPY;  Surgeon:  Marissa Nestle, MD;  Location: [facility];  Service: Urology;  Laterality: N/A;  . ESOPHAGOGASTRODUODENOSCOPY (EGD) WITH ESOPHAGEAL DILATION  02/16/2011   with Propofol  . FOOT ARTHRODESIS, SUBTALAR Right 10/13/2009  . MASS EXCISION N/A 03/13/2012   Procedure: EXCISION LEFT BUCCAL MUCOSA;  Surgeon: Rozetta Nunnery, MD;  Location: [facility];  Service: ENT;  Laterality: N/A;  . RADICAL NECK DISSECTION Left 03/13/2012   Procedure: RADICAL NECK DISSECTION;  Surgeon: Rozetta Nunnery, MD;  Location: [facility];  Service: ENT;  Laterality: Left;  . TOOTH EXTRACTION N/A 03/13/2012   Procedure: EXTRACTION MOLARS;  Surgeon: Rozetta Nunnery, MD;  Location: [facility];  Service: ENT;  Laterality: N/A;  . TRIGGER FINGER  RELEASE Right 02/24/2016   Procedure: RELEASE TRIGGER FINGER/A-1 PULLEY RIGHT THUMB;  Surgeon: Daryll Brod, MD;  Location: [facility];  Service: Orthopedics;  Laterality: Right;  FAB   Family History  Problem Relation Age of Onset  . Stroke Sister   . Diabetes Brother   . Seizures Mother   . Asthma Father   . Thyroid cancer Daughter   . Diabetes Sister   . Parkinson's disease Brother   . Heart disease Brother   . Diabetes Brother   . Colon cancer Neg Hx    Social History   Socioeconomic History  . Marital status: Married    Spouse name: Not on file  . Number of children: 3  . Years of education: Not on file  . Highest education level: Not on file  Occupational History  . Occupation: disabled    Employer: RETIRED    Comment: was in [place]  . Financial resource strain: Not on file  . Food insecurity    Worry: Not on file    Inability: Not on file  . Transportation needs    Medical: Not on file    Non-medical: Not on file  Tobacco Use  . Smoking status: Never Smoker  . Smokeless tobacco: Never Used  Substance and Sexual Activity  . Alcohol use: No  . Drug use: No  . Sexual activity: Yes  Lifestyle  . Physical activity    Days per week: Not on file    Minutes per session: Not on file  . Stress: Not on file  Relationships  . Social Herbalist on phone: Not on file    Gets together: Not on file    Attends religious service: Not on file    Active member of club or organization: Not on file    Attends meetings of clubs or organizations: Not on file    Relationship status: Not on file  Other Topics Concern  . Not on file  Social History Narrative   Married;  2 children in [place], son in [place]; 6 grandchildren       Outpatient Encounter Medications as of 09/12/2018  Medication Sig  . Accu-Chek FastClix Lancets MISC CHECK BLOOD SUGAR ONE TIME DAILY AS DIRECTED  . antiseptic oral rinse (BIOTENE) LIQD 15 mLs by Mouth Rinse route  daily as needed for dry mouth.   [name] aspirin EC 81 MG tablet Take 81 mg by mouth daily.  [name] co-enzyme Q-10 30 MG capsule Take 30 mg by mouth daily.  . enalapril (VASOTEC) 10 MG tablet TAKE 1 TABLET EVERY DAY  . glucose blood (ACCU-CHEK GUIDE) test strip 1 each by Other route 2 (two) times daily. Use as instructed  . insulin degludec (TRESIBA FLEXTOUCH) 100 UNIT/ML  SOPN FlexTouch Pen inject 30  Units subcutaneously daily at bedtime (Patient taking differently: 35 Units daily. inject 35  Units subcutaneously daily at bedtime)  . metoprolol succinate (TOPROL-XL) 25 MG 24 hr tablet TAKE 1 TABLET EVERY DAY  . Multiple Vitamins-Minerals (MULTIVITAMIN WITH MINERALS) tablet Take 1 tablet by mouth 2 (two) times daily.   . nitroGLYCERIN (NITROSTAT) 0.4 MG SL tablet Place 1 tablet (0.4 mg total) under the tongue every 5 (five) minutes as needed. Chest pain  . omeprazole (PRILOSEC) 40 MG capsule TAKE 1 CAPSULE EVERY DAY BEFORE A MEAL (Patient taking differently: 40 mg daily as needed. )  . [place], [place]renoa repens, 320 MG CAPS Take 1 capsule by mouth daily.   . simvastatin (ZOCOR) 40 MG tablet Take 1 tablet (40 mg total) by mouth every evening.   No facility-administered encounter medications on file as of 09/12/2018.     Activities of Daily Living In your present state of health, do you have any difficulty performing the following activities: 09/12/2018 10/23/2017  Hearing? N Y  Comment - from L side since neck cancer surgery, has seen ENT for this and cant hear high pitch tomes  Vision? N N  Comment - had normal eye xm this year  Difficulty concentrating or making decisions? N N  Walking or climbing stairs? N N  Dressing or bathing? N N  Doing errands, shopping? N N  Preparing Food and eating ? N -  Using the Toilet? N -  In the past six months, have you accidently leaked urine? N -  Do you have problems with loss of bowel control? N -  Managing your Medications? N -  Managing your Finances? N -   Housekeeping or managing your Housekeeping? N -  Some recent data might be hidden    Patient Care Team: Rodriguez-Southworth, Sandrea Matte as PCP - General (Internal Medicine) Fay Records, MD as PCP - Cardiology (Cardiology) Rozetta Nunnery, MD as Attending Physician (Otolaryngology) Eppie Gibson, MD as Attending Physician (Radiation Oncology) Fay Records, MD as Attending Physician (Cardiology) Inda Castle, MD (Inactive) as Attending Physician (Gastroenterology) Lynne Logan, RN as Case Manager Lavera Guise, [facility] (Pharmacist)   Assessment:   This is a routine wellness examination for [place].  Exercise Activities and Dietary recommendations Current Exercise Habits: Home exercise routine, Time (Minutes): 30, Frequency (Times/Week): 7, Weekly Exercise (Minutes/Week): 210  Goals    . "I would like to talk to you about my diabetes" (pt-stated)     Current Barriers:  [name] Knowledge Deficits related to disease process and self health management for Diabetes  Nurse Case Manager Clinical Goal(s):  [name] Over the next 90 days, patient will demonstrate improved adherence to his prescribed treatment plan for Diabetes Management as evidenced by verbalizing understanding and adherence to monitoring CBGs, taking insulin as prescribed and improved adherence to following a Diabetic diet. 08/30/18: Goal date revised to 90 days due to delays in treatment secondary to COVID-19.   CCM RN CM Interventions:   Telephone CCM follow up completed with patient today  . Evaluation of current treatment plan related to Diabetes and patient's adherence to plan as established by provider. . Discussed patient received and reviewed the mailed diabetes printed ed mats-he denies questions . Discussed additional printed ed mats to be mailed related Meal Planning using the Plate Method    Reviewed and discussed adherence to taking prescribed insulin; pt noted he switched from taking his insulin from the pm to  the am and is noticing his BS are within normal limits  Discussed plans with patient for ongoing care management follow up and provided patient with direct contact information for care management team  Scheduled a CCM telephone follow up with patient in about 2 weeks  Sent pharmacy referral to [facility] to further evaluate insulin regimen-pt aware  CCM PharmD Interventions: Telephone CCM follow up completed with patient today 08/30/18 . Comprehensive medication review performed and list updated in the EMR.  No changes to current regimen . Patient is tolerating therapy well and denies adverse events. . His FBG this AM was 98.  His A1c is likely at goal based on his new FBG in the 90s.  He denies hypoglycemia (BG<70).  He reports that most of his FBGs range from 90s depending on his night time snacking habits.   . Reviewed meal planning and how to find alternatives to snacks with higher carbohydrates.  He states he has decreased his ice cream and potato chip intake.  He is more mindful of snacking and is avoiding foods with higher carbohydrate intake. . Patient continues to inject 35 units daily of Tresiba as monotherapy for his diabetes.  He injects [place] in the mornings and this has proven to work well with his schedule.  His Bgs have responded well to this.  His A1c has decreased from 10.2-->8.9 in 05/29/18.  Encouraged patient to continue his great progress.  His repeat A1c is due at office visit on 09/12/18. [name] Patient has been approved for free Tresiba through [employer] patient assistance program until 01/23/19.  This will help him to achieve target A1c~7%.  Patient Self Care Activities:  . Self administers medications as prescribed . Attends all scheduled provider appointments . Calls pharmacy for medication refills . Performs ADL's independently . Performs IADL's independently . Calls provider office for new concerns or questions  Please see past updates related to this goal  by clicking on the "Past Updates" button in the selected goal       . Patient Stated     09/12/2018, no goals       Fall Risk Fall Risk  09/12/2018 08/08/2018 04/04/2018 03/21/2018 03/07/2018  Falls in the past year? 0 0 0 0 0  Risk for fall due to : Medication side effect - - - -  Follow up Falls evaluation completed;Education provided;Falls prevention discussed - - - -   Is the patient's home free of loose throw rugs in walkways, pet beds, electrical cords, etc?   yes      Grab bars in the bathroom? no      Handrails on the stairs?   yes      Adequate lighting?   yes  Timed Get Up and Go Performed:  n/a  Depression Screen PHQ 2/9 Scores 09/12/2018 08/08/2018 04/04/2018 03/21/2018  PHQ - 2 Score 0 0 0 0  PHQ- 9 Score 0 - - -    Cognitive Function MMSE - Mini Mental State Exam 10/23/2017  Orientation to time 5  Orientation to Place 5  Registration 3  Attention/ Calculation 5  Recall 3  Language- name 2 objects 2  Language- repeat 1  Language- follow 3 step command 3  Language- read & follow direction 1  Write a sentence 1  Copy design 1  Total score 30     6CIT Screen 09/12/2018  What Year? 0 points  What month? 0 points  What time? 0 points  Count back from  20 0 points  Repeat phrase 0 points    Immunization History  Administered Date(s) Administered  . Influenza Split 11/24/2010, 12/06/2011  . Influenza, High Dose Seasonal PF 11/17/2014, 10/23/2017  . Influenza,inj,Quad PF,6+ Mos 11/25/2012, 01/01/2014  . Pneumococcal Conjugate-13 01/07/2015  . Pneumococcal Polysaccharide-23 10/21/2009  . Tdap 05/27/2009    Qualifies for Shingles Vaccine? yes  Screening Tests Health Maintenance  Topic Date Due  . PNA vac Low Risk Adult (2 of 2 - PPSV23) 01/07/2016  . FOOT EXAM  07/07/2016  . OPHTHALMOLOGY EXAM  05/30/2018  . INFLUENZA VACCINE  08/24/2018  . HEMOGLOBIN A1C  02/08/2019  . TETANUS/TDAP  05/28/2019  . COLONOSCOPY  05/31/2022  . Hepatitis C Screening   Completed   Cancer Screenings: Lung: Low Dose CT Chest recommended if Age 77-80 years, 30 pack-year currently smoking OR have quit w/in 15years. Patient does not qualify. Colorectal: up to date  Additional Screenings:  Hepatitis C Screening:      Plan:    Patient has no goals set at this time.  I have personally reviewed and noted the following in the patient's chart:   . Medical and social history . Use of alcohol, tobacco or illicit drugs  . Current medications and supplements . Functional ability and status . Nutritional status . Physical activity . Advanced directives . List of other physicians . Hospitalizations, surgeries, and ER visits in previous 12 months . Vitals . Screenings to include cognitive, depression, and falls . Referrals and appointments  In addition, I have reviewed and discussed with patient certain preventive protocols, quality metrics, and best practice recommendations. A written personalized care plan for preventive services as well as general preventive health recommendations were provided to patient.     Kellie Simmering, LPN  [id number]

## 2018-09-12 NOTE — Progress Notes (Signed)
Subjective:     Patient ID: Gregory Friedman , male    DOB: 06-17-1950 , 68 y.o.   MRN: [id number]   Chief Complaint  Patient presents with  . Diabetes    HPI Here for DM and states one of the pens was malfunctioning and his glucose were high around 181-276 last week, so he changed to a different pen this week, and this week has been 93-114 and better as the days go on. This am fasting glucose was 93. He did not make any changes in his diet this week compared to last week. His fingers that used to have a pain are better and now is able to play his guitar and noticed this is since his glucose have been in better control.  His lower abdominal pain which I placed him on treatments for diverticulitis and is better and no longer has abdominal pain.   Past Medical History:  Diagnosis Date  . Anxiety   . BPH (benign prostatic hyperplasia)   . Cancer ([place]) 2014   neck area  . Colon polyps   . Coronary artery disease   . Depression   . Diverticulosis   . GERD (gastroesophageal reflux disease)   . History of gout    toe  . 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 Hyperlipidemia   . Hypertension    states under control with med., has been on med. since 2003  . 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 Myocardial infarction ([place]) 2003  . Non-insulin dependent type 2 diabetes mellitus ([place])   . PONV (postoperative nausea and vomiting)   . S/P radiation therapy    Received 8 fractions, then stopped due to mucositis with Radiation/ Cisplatin  . Status post dilation of esophageal narrowing   . Trigger thumb of right hand 01/2016     Family History  Problem Relation Age of Onset  . Stroke Sister   . Diabetes Brother   . Seizures Mother   . Asthma Father   . Thyroid cancer Daughter   . Diabetes Sister   . Parkinson's disease Brother   . Heart disease Brother   . Diabetes Brother   . Colon cancer Neg  Hx      Current Outpatient Medications:  .  Accu-Chek FastClix Lancets MISC, CHECK BLOOD SUGAR ONE TIME DAILY AS DIRECTED, Disp: 150 each, Rfl: 2 .  antiseptic oral rinse (BIOTENE) LIQD, 15 mLs by Mouth Rinse route daily as needed for dry mouth. , Disp: , Rfl:  .  aspirin EC 81 MG tablet, Take 81 mg by mouth daily., Disp: , Rfl:  .  co-enzyme Q-10 30 MG capsule, Take 30 mg by mouth daily., Disp: , Rfl:  .  enalapril (VASOTEC) 10 MG tablet, TAKE 1 TABLET EVERY DAY, Disp: 90 tablet, Rfl: 1 .  glucose blood (ACCU-CHEK GUIDE) test strip, 1 each by Other route 2 (two) times daily. Use as instructed, Disp: 200 each, Rfl: 12 .  insulin degludec (TRESIBA FLEXTOUCH) 100 UNIT/ML SOPN FlexTouch Pen, inject 30  Units subcutaneously daily at bedtime (Patient taking differently: 35 Units daily. inject 35  Units subcutaneously daily at bedtime), Disp: 15 pen, Rfl: 3 .  metoprolol succinate (TOPROL-XL) 25 MG 24 hr tablet, TAKE 1 TABLET EVERY DAY, Disp: 90 tablet, Rfl: 0 .  Multiple Vitamins-Minerals (MULTIVITAMIN WITH MINERALS) tablet, Take 1 tablet by mouth 2 (two) times daily. ,  Disp: , Rfl:  .  nitroGLYCERIN (NITROSTAT) 0.4 MG SL tablet, Place 1 tablet (0.4 mg total) under the tongue every 5 (five) minutes as needed. Chest pain, Disp: 25 tablet, Rfl: 3 .  omeprazole (PRILOSEC) 40 MG capsule, TAKE 1 CAPSULE EVERY DAY BEFORE A MEAL (Patient taking differently: 40 mg daily as needed. ), Disp: 90 capsule, Rfl: 0 .  Saw Palmetto, Serenoa repens, 320 MG CAPS, Take 1 capsule by mouth daily. , Disp: , Rfl:  .  simvastatin (ZOCOR) 40 MG tablet, Take 1 tablet (40 mg total) by mouth every evening., Disp: 90 tablet, Rfl: 1   Allergies  Allergen Reactions  . Shellfish Allergy Swelling    In toes     Review of Systems  Denies CP, SOB, sweating, abdominal pain, polydipsia, polyuria, rashes. His L gum soreness is better since he has been doing pooling treatment.  Today's Vitals   09/12/18 1525  BP: (!) 148/78   Pulse: (!) 57  Resp: (!) 94  Temp: 98.2 F (36.8 C)  Weight: 193 lb 6.4 oz (87.7 kg)  Height: 5' 5.8" (1.671 m)   Body mass index is 31.41 kg/m.   Objective:  Physical Exam  Repeated BP- 140/78   Constitutional: he is oriented to person, place, and time. he appears well-developed and well-nourished. No distress.  HENT:  Head: Normocephalic and atraumatic.  Right Ear: External ear normal.  Left Ear: External ear normal.  Nose: Nose normal.  Eyes: Conjunctivae are normal. Right eye exhibits no discharge. Left eye exhibits no discharge. No scleral icterus.  Neck: Neck supple. No thyromegaly present.  No carotid bruits bilaterally  Cardiovascular: Normal rate and regular rhythm.  No murmur heard. Pulmonary/Chest: Effort normal and breath sounds normal. No respiratory distress.  Musculoskeletal: Normal range of motion. he exhibits no edema.  Lymphadenopathy: he has no cervical adenopathy.  Neurological: he is alert and oriented to person, place, and time.  Skin: Skin is warm and dry. Capillary refill takes less than 2 seconds. No rash noted. he is not diaphoretic.  Psychiatric: he has a normal mood and affect. His behavior is normal. Judgment and thought content normal.  Nursing note reviewed. Assessment And Plan:    1. Elevated serum creatinine- chronic - BMP8+Anion Gap - Hemoglobin A1c  2. Diabetes mellitus type 2, insulin dependent ([place])- with improved control this week.  Advised to toss the bad pen.  FU 1 month.  3- Hypertension- not optimal control today.     Advised to do BP diaries and call Tian[place] on Wed. With diaries to le me know on Thursday.     Jonalyn Sedlak RODRIGUEZ-SOUTHWORTH, PA-C    THE PATIENT IS ENCOURAGED TO PRACTICE SOCIAL DISTANCING DUE TO THE COVID-19 PANDEMIC.

## 2018-09-13 LAB — BMP8+ANION GAP
Anion Gap: 15 mmol/L (ref 10.0–18.0)
BUN/Creatinine Ratio: 11 (ref 10–24)
BUN: 16 mg/dL (ref 8–27)
CO2: 27 mmol/L (ref 20–29)
Calcium: 9.7 mg/dL (ref 8.6–10.2)
Chloride: 99 mmol/L (ref 96–106)
Creatinine, Ser: 1.4 mg/dL — ABNORMAL HIGH (ref 0.76–1.27)
GFR calc Af Amer: 59 mL/min/{1.73_m2} — ABNORMAL LOW (ref >59)
GFR calc non Af Amer: 51 mL/min/{1.73_m2} — ABNORMAL LOW (ref >59)
Glucose: 200 mg/dL — ABNORMAL HIGH (ref 65–99)
Potassium: 4.4 mmol/L (ref 3.5–5.2)
Sodium: 141 mmol/L (ref 134–144)

## 2018-09-13 LAB — HEMOGLOBIN A1C
Est. average glucose Bld gHb Est-mCnc: 189 mg/dL
Hgb A1c MFr Bld: 8.2 % — ABNORMAL HIGH (ref 4.8–5.6)

## 2018-09-13 LAB — MICROALBUMIN / CREATININE URINE RATIO
Creatinine, Urine: 29.6 mg/dL
Microalb/Creat Ratio: <10 mg/g creat (ref 0–29)
Microalbumin, Urine: <3 ug/mL

## 2018-09-25 ENCOUNTER — Other Ambulatory Visit: Payer: Self-pay | Admitting: Internal Medicine

## 2018-09-26 ENCOUNTER — Ambulatory Visit: Payer: Medicare HMO | Admitting: Pharmacist

## 2018-10-01 NOTE — Progress Notes (Signed)
  Chronic Care Management   Outreach Note  09/26/2018 Name: Gregory Friedman MRN: [id number] DOB: [birth year]  Referred by: Shelby Mattocks, PA-C Reason for referral : Chronic Care Management   An unsuccessful telephone outreach was attempted today. The patient was referred to the case management team by for assistance with chronic care management and care coordination.   Follow Up Plan: A HIPPA compliant phone message was left for the patient providing contact information and requesting a return call. Will follow up with patient next week or sooner if call returned.  Regina Eck, PharmD, BCPS Clinical Pharmacist, [facility] [place]: [phone]

## 2018-10-02 ENCOUNTER — Ambulatory Visit (INDEPENDENT_AMBULATORY_CARE_PROVIDER_SITE_OTHER): Payer: Medicare HMO | Admitting: Pharmacist

## 2018-10-02 DIAGNOSIS — I10 Essential (primary) hypertension: Secondary | ICD-10-CM | POA: Diagnosis not present

## 2018-10-02 DIAGNOSIS — E1165 Type 2 diabetes mellitus with hyperglycemia: Secondary | ICD-10-CM | POA: Diagnosis not present

## 2018-10-08 NOTE — Patient Instructions (Signed)
Visit Information  Goals Addressed            This Visit's Progress     Patient Stated   . "I would like to talk to you about my diabetes" (pt-stated)       Current Barriers:  [name] Knowledge Deficits related to disease process and self health management for Diabetes  Nurse Case Manager & PharmD Clinical Goal(s):  [name] Over the next 90 days, patient will demonstrate improved adherence to his prescribed treatment plan for Diabetes Management as evidenced by verbalizing understanding and adherence to monitoring CBGs, taking insulin as prescribed and improved adherence to following a Diabetic diet. 08/30/18: Goal date revised to 90 days due to delays in treatment secondary to COVID-19.   CCM PharmD Interventions: Telephone CCM follow up completed with patient today 10/01/18 . Comprehensive medication review performed and list updated in the EMR.  No changes to current regimen . Patient is tolerating therapy well and denies adverse events. . His FBG this AM was 110.  His last A1c stayed the same at 8.2% on 09/12/18.  Patient was surprised that this number was not lower based on his FBG<130.  He denies hypoglycemia (BG<70).  He reports that most of his FBGs range from 90s depending on his night time snacking habits.   . Reviewed meal planning and how to find alternatives to snacks with higher carbohydrates.  He states he has decreased his ice cream and potato chip intake.  He is more mindful of snacking and is avoiding foods with higher carbohydrate intake.  Encouraged patient to increase water intake and begin to exercise as able and as recommended by PCP. [name] Patient continues to inject 35 units daily of Tresiba as monotherapy for his diabetes.  He injects [place] in the mornings and this has proven to work well with his schedule.  His Bgs have responded well to this.  Encouraged patient to continue his great progress.  His repeat A1c is due at office visit on 10/31/18. [name] Patient has been approved for free  Tresiba through [employer] patient assistance program until 01/23/19.  This will help him to achieve target A1c~7%.  Patient Self Care Activities:  . Self administers medications as prescribed . Attends all scheduled provider appointments . Calls pharmacy for medication refills . Performs ADL's independently . Performs IADL's independently . Calls provider office for new concerns or questions  Please see past updates related to this goal by clicking on the "Past Updates" button in the selected goal          The patient verbalized understanding of instructions provided today and declined a print copy of patient instruction materials.   The care management team will reach out to the patient again over the next 4 weeks.  Regina Eck, PharmD, BCPS Clinical Pharmacist, [facility] [place]: [phone]

## 2018-10-08 NOTE — Progress Notes (Signed)
Chronic Care Management   Visit Note  10/01/2018 Name: Gregory Friedman MRN: [id number] DOB: 09/09/1950  Referred by: Shelby Mattocks, PA-C Reason for referral : Chronic Care Management   Gregory Friedman is a 68 y.o. year old male who is a primary care patient of [place], Sunday Spill[place], [place]. The CCM team was consulted for assistance with chronic disease management and care coordination needs.   Review of patient status, including review of consultants reports, relevant laboratory and other test results, and collaboration with appropriate care team members and the patient's provider was performed as part of comprehensive patient evaluation and provision of chronic care management services.    I spoke with [name] by telephone today.  Advanced Directives Status: N See Care Plan and [employer]yn[employer] application for related entries.   Medications: Outpatient Encounter Medications as of 10/02/2018  Medication Sig  . insulin degludec (TRESIBA FLEXTOUCH) 100 UNIT/ML SOPN FlexTouch Pen inject 30  Units subcutaneously daily at bedtime (Patient taking differently: 35 Units daily. inject 35  Units subcutaneously daily at bedtime)  . metoprolol succinate (TOPROL-XL) 25 MG 24 hr tablet TAKE 1 TABLET EVERY DAY  . Multiple Vitamins-Minerals (MULTIVITAMIN WITH MINERALS) tablet Take 1 tablet by mouth 2 (two) times daily.   . nitroGLYCERIN (NITROSTAT) 0.4 MG SL tablet Place 1 tablet (0.4 mg total) under the tongue every 5 (five) minutes as needed. Chest pain  . omeprazole (PRILOSEC) 40 MG capsule TAKE 1 CAPSULE EVERY DAY BEFORE A MEAL (Patient taking differently: 40 mg daily as needed. )  . Saw Palmetto, Serenoa repens, 320 MG CAPS Take 1 capsule by mouth daily.   . simvastatin (ZOCOR) 40 MG tablet Take 1 tablet (40 mg total) by mouth every evening.  . Accu-Chek FastClix Lancets MISC CHECK BLOOD SUGAR ONE TIME DAILY AS DIRECTED  . antiseptic oral rinse (BIOTENE) LIQD 15 mLs by Mouth  Rinse route daily as needed for dry mouth.   Gregory Friedman aspirin EC 81 MG tablet Take 81 mg by mouth daily.  Gregory Friedman co-enzyme Q-10 30 MG capsule Take 30 mg by mouth daily.  . enalapril (VASOTEC) 10 MG tablet TAKE 1 TABLET EVERY DAY  . glucose blood (ACCU-CHEK GUIDE) test strip 1 each by Other route 2 (two) times daily. Use as instructed   No facility-administered encounter medications on file as of 10/02/2018.      Objective:   Goals Addressed            This Visit's Progress     Patient Stated   . "I would like to talk to you about my diabetes" (pt-stated)       Current Barriers:  Gregory Friedman Knowledge Deficits related to disease process and self health management for Diabetes  Nurse Case Manager & PharmD Clinical Goal(s):  Gregory Friedman Over the next 90 days, patient will demonstrate improved adherence to his prescribed treatment plan for Diabetes Management as evidenced by verbalizing understanding and adherence to monitoring CBGs, taking insulin as prescribed and improved adherence to following a Diabetic diet. 08/30/18: Goal date revised to 90 days due to delays in treatment secondary to COVID-19.   CCM PharmD Interventions: Telephone CCM follow up completed with patient today 10/01/18 . Comprehensive medication review performed and list updated in the EMR.  No changes to current regimen . Patient is tolerating therapy well and denies adverse events. . His FBG this AM was 110.  His last A1c stayed the same at 8.2% on 09/12/18.  Patient was surprised that this number was not lower based on his  FBG<130.  He denies hypoglycemia (BG<70).  He reports that most of his FBGs range from 90s depending on his night time snacking habits.   . Reviewed meal planning and how to find alternatives to snacks with higher carbohydrates.  He states he has decreased his ice cream and potato chip intake.  He is more mindful of snacking and is avoiding foods with higher carbohydrate intake.  Encouraged patient to increase water intake and begin to  exercise as able and as recommended by PCP. Gregory Friedman Patient continues to inject 35 units daily of Tresiba as monotherapy for his diabetes.  He injects [place] in the mornings and this has proven to work well with his schedule.  His Bgs have responded well to this.  Encouraged patient to continue his great progress.  His repeat A1c is due at office visit on 10/31/18. Gregory Friedman Patient has been approved for free Tresiba through [employer] patient assistance program until 01/23/19.  This will help him to achieve target A1c~7%.  Patient Self Care Activities:  . Self administers medications as prescribed . Attends all scheduled provider appointments . Calls pharmacy for medication refills . Performs ADL's independently . Performs IADL's independently . Calls provider office for new concerns or questions  Please see past updates related to this goal by clicking on the "Past Updates" button in the selected goal          Plan:   The care management team will reach out to the patient again over the next 4 weeks.  Regina Eck, PharmD, BCPS Clinical Pharmacist, [facility] [place]: [phone]

## 2018-10-09 ENCOUNTER — Telehealth: Payer: Self-pay | Admitting: Internal Medicine

## 2018-10-09 NOTE — Telephone Encounter (Signed)
Pt calling requesting a refill on metoprolol. Pt states that Dr. Harrington Challenger increased his metoprolol 25 mg tablet to 50 mg daily. Does pt supposed to be taking metoprolol 25 mg tablet, 2 tablets daily? Please address

## 2018-10-09 NOTE — Telephone Encounter (Signed)
Yes, pt should take 2 tablets daily   OK to refill

## 2018-10-09 NOTE — Telephone Encounter (Signed)
°*  STAT* If patient is at the pharmacy, call can be transferred to refill team.   1. Which medications need to be refilled? (please list name of each medication and dose if known)  metoprolol succinate (TOPROL-XL)  50 mg  2. Which pharmacy/location (including street and city if local pharmacy) is medication to be sent to?  [place], [place]  3. Do they need a 30 day or 90 day supply? 90  Patient states Dr. Harrington Challenger increased the dosage to 50mg . His current rx is for 25mg . A new Rx will need to be sent to [facility]

## 2018-10-10 ENCOUNTER — Ambulatory Visit (INDEPENDENT_AMBULATORY_CARE_PROVIDER_SITE_OTHER): Payer: Medicare HMO | Admitting: Internal Medicine

## 2018-10-10 ENCOUNTER — Encounter: Payer: Self-pay | Admitting: Internal Medicine

## 2018-10-10 ENCOUNTER — Other Ambulatory Visit: Payer: Self-pay

## 2018-10-10 VITALS — Temp 98.7°F | Ht 65.8 in | Wt 194.6 lb

## 2018-10-10 DIAGNOSIS — IMO0002 Reserved for concepts with insufficient information to code with codable children: Secondary | ICD-10-CM

## 2018-10-10 DIAGNOSIS — H8113 Benign paroxysmal vertigo, bilateral: Secondary | ICD-10-CM

## 2018-10-10 DIAGNOSIS — E1165 Type 2 diabetes mellitus with hyperglycemia: Secondary | ICD-10-CM | POA: Diagnosis not present

## 2018-10-10 DIAGNOSIS — E11618 Type 2 diabetes mellitus with other diabetic arthropathy: Secondary | ICD-10-CM | POA: Diagnosis not present

## 2018-10-10 DIAGNOSIS — Z794 Long term (current) use of insulin: Secondary | ICD-10-CM | POA: Diagnosis not present

## 2018-10-10 DIAGNOSIS — I10 Essential (primary) hypertension: Secondary | ICD-10-CM | POA: Diagnosis not present

## 2018-10-10 MED ORDER — TRESIBA FLEXTOUCH 100 UNIT/ML ~~LOC~~ SOPN: PEN_INJECTOR | SUBCUTANEOUS | 3 refills | Status: DC

## 2018-10-10 MED ORDER — MECLIZINE HCL 25 MG PO TABS: 25.0000 mg | ORAL_TABLET | Freq: Three times a day (TID) | ORAL | 0 refills | Status: DC

## 2018-10-10 MED ORDER — METOPROLOL SUCCINATE ER 25 MG PO TB24: 50.0000 mg | ORAL_TABLET | Freq: Every day | ORAL | 3 refills | Status: DC

## 2018-10-10 NOTE — Addendum Note (Signed)
Addended by: Nicki Guadalajara on: 10/10/2018 07:12 PM   Modules accepted: Orders

## 2018-10-10 NOTE — Telephone Encounter (Signed)
Prescription updated, sent refill for metoprolol 25 mg --take 50 mg daily.

## 2018-10-10 NOTE — Progress Notes (Addendum)
Subjective:     Patient ID: Gregory Friedman , male    DOB: 07/02/1950 , 68 y.o.   MRN: [id number]   Chief Complaint  Patient presents with  . Follow-up    Diabetes    HPI Pt is here for DM FU. His fasting glucose has been around 100-123.  Onset of Vertigo  Mond and Tuesd when he got up only and also when he turned over in bed. Has not had this since 2013 at which time he had his PCP do Epley's maneuver and resolved. Has felt nauseous as well. Denies chest pains or SOB. Has been very irritable with having to wait in cars before he can see his cardiologist or ophthalmologist and has decided to not see them. Same with having to see an endocrinologist and does not want to deal with this.   Past Medical History:  Diagnosis Date  . Anxiety   . BPH (benign prostatic hyperplasia)   . Cancer ([place]) 2014   neck area  . Colon polyps   . Coronary artery disease   . Depression   . Diverticulosis   . GERD (gastroesophageal reflux disease)   . History of gout    toe  . 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 Hyperlipidemia   . Hypertension    states under control with med., has been on med. since 2003  . 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 Myocardial infarction ([place]) 2003  . Non-insulin dependent type 2 diabetes mellitus ([place])   . PONV (postoperative nausea and vomiting)   . S/P radiation therapy    Received 8 fractions, then stopped due to mucositis with Radiation/ Cisplatin  . Status post dilation of esophageal narrowing   . Trigger thumb of right hand 01/2016     Family History  Problem Relation Age of Onset  . Stroke Sister   . Diabetes Brother   . Seizures Mother   . Asthma Father   . Thyroid cancer Daughter   . Diabetes Sister   . Parkinson's disease Brother   . Heart disease Brother   . Diabetes Brother   . Colon cancer Neg Hx      Current Outpatient Medications:  .  Accu-Chek  FastClix Lancets MISC, CHECK BLOOD SUGAR ONE TIME DAILY AS DIRECTED, Disp: 150 each, Rfl: 2 .  antiseptic oral rinse (BIOTENE) LIQD, 15 mLs by Mouth Rinse route daily as needed for dry mouth. , Disp: , Rfl:  .  aspirin EC 81 MG tablet, Take 81 mg by mouth daily., Disp: , Rfl:  .  co-enzyme Q-10 30 MG capsule, Take 30 mg by mouth daily., Disp: , Rfl:  .  enalapril (VASOTEC) 10 MG tablet, TAKE 1 TABLET EVERY DAY, Disp: 90 tablet, Rfl: 1 .  glucose blood (ACCU-CHEK GUIDE) test strip, 1 each by Other route 2 (two) times daily. Use as instructed, Disp: 200 each, Rfl: 12 .  insulin degludec (TRESIBA FLEXTOUCH) 100 UNIT/ML SOPN FlexTouch Pen, inject 30  Units subcutaneously daily at bedtime (Patient taking differently: 35 Units daily. inject 35  Units subcutaneously daily at bedtime), Disp: 15 pen, Rfl: 3 .  Multiple Vitamins-Minerals (MULTIVITAMIN WITH MINERALS) tablet, Take 1 tablet by mouth 2 (two) times daily. , Disp: , Rfl:  .  nitroGLYCERIN (NITROSTAT) 0.4 MG SL tablet, Place 1 tablet (0.4 mg total) under the tongue every 5 (five) minutes as needed. Chest  pain, Disp: 25 tablet, Rfl: 3 .  omeprazole (PRILOSEC) 40 MG capsule, TAKE 1 CAPSULE EVERY DAY BEFORE A MEAL (Patient taking differently: 40 mg daily as needed. ), Disp: 90 capsule, Rfl: 0 .  Saw Palmetto, Serenoa repens, 320 MG CAPS, Take 1 capsule by mouth daily. , Disp: , Rfl:  .  simvastatin (ZOCOR) 40 MG tablet, Take 1 tablet (40 mg total) by mouth every evening., Disp: 90 tablet, Rfl: 1 .  metoprolol succinate (TOPROL-XL) 25 MG 24 hr tablet, Take 2 tablets (50 mg total) by mouth daily., Disp: 180 tablet, Rfl: 3   Allergies  Allergen Reactions  . Shellfish Allergy Swelling    In toes     Review of Systems  + dizziness, few early morning sweating episodes but did not check his glucose. Denies chest pressure, SOB, vomiting, diarrhea or abdominal pain. Feels stressed with the masking and political situation.  Today's Vitals   10/10/18 1448   BP: (!) 144/80  Pulse: 63  Temp: 98.7 F (37.1 C)  TempSrc: Oral  Weight: 194 lb 9.6 oz (88.3 kg)  Height: 5' 5.8" (1.671 m)   Body mass index is 31.6 kg/m.   Objective:  Physical Exam Vitals signs and nursing note reviewed.  Constitutional:      General: He is not in acute distress.    Appearance: Normal appearance. He is not ill-appearing or toxic-appearing.  HENT:     Head: Normocephalic.     Right Ear: Tympanic membrane, ear canal and external ear normal.     Left Ear: Tympanic membrane, ear canal and external ear normal.     Mouth/Throat:     Mouth: Mucous membranes are moist.  Eyes:     General: Scleral icterus present.     Extraocular Movements: Extraocular movements intact.     Conjunctiva/sclera: Conjunctivae normal.     Pupils: Pupils are equal, round, and reactive to light.     Comments: Has positive nystagmus with just laying down on his back during the exam.   Neck:     Musculoskeletal: Neck rigidity present.     Comments: Rotation of neck to his L is very stiff. No carotid bruits heard.  Cardiovascular:     Rate and Rhythm: Normal rate and regular rhythm.     Heart sounds: Normal heart sounds. No murmur.  Pulmonary:     Effort: Pulmonary effort is normal.     Breath sounds: Normal breath sounds.  Musculoskeletal: Normal range of motion.  Lymphadenopathy:     Cervical: Cervical adenopathy present.  Skin:    General: Skin is warm.     Coloration: Skin is not pale.     Findings: No rash.     Comments: He got clammy during the [facility] and after.   Neurological:     Mental Status: He is alert and oriented to person, place, and time.     Motor: No weakness.     Coordination: Coordination normal.     Gait: Gait normal.  Psychiatric:        Behavior: Behavior normal.        Thought Content: Thought content normal.        Judgment: Judgment normal.     Comments: Mood is preocupied     Assessment And Plan:    1. Benign paroxysmal vertigo,  bilateral-  acute.    He was willing for me to do the Epley's manuver so I did it, and when done was clammy and naseous which  passed after 15 minutes. He felt much better when he left. I called him at 7 pm to check on him and he was still having mild vertigo and nausea. I placed an order for PT to see him, hopefully tomorrow.   2. Essential hypertension- not optimal control more likely due to not feeling well.   3. Uncontrolled diabetes mellitus with arthropathy, with long-term current use of insulin ([place])- HGBA1C 8.2 last month. I increased his insulin to 40 U. FU 10/8 for his physical.      Gregory Macari RODRIGUEZ-SOUTHWORTH, PA-C    THE PATIENT IS ENCOURAGED TO PRACTICE SOCIAL DISTANCING DUE TO THE COVID-19 PANDEMIC.

## 2018-10-14 ENCOUNTER — Ambulatory Visit: Payer: Medicare HMO | Admitting: Internal Medicine

## 2018-10-17 ENCOUNTER — Telehealth: Payer: Medicare HMO

## 2018-10-17 DIAGNOSIS — M542 Cervicalgia: Secondary | ICD-10-CM | POA: Diagnosis not present

## 2018-10-17 DIAGNOSIS — H8111 Benign paroxysmal vertigo, right ear: Secondary | ICD-10-CM | POA: Diagnosis not present

## 2018-10-23 DIAGNOSIS — H8111 Benign paroxysmal vertigo, right ear: Secondary | ICD-10-CM | POA: Diagnosis not present

## 2018-10-23 DIAGNOSIS — M542 Cervicalgia: Secondary | ICD-10-CM | POA: Diagnosis not present

## 2018-10-31 ENCOUNTER — Ambulatory Visit: Payer: Medicare HMO

## 2018-10-31 ENCOUNTER — Other Ambulatory Visit: Payer: Self-pay

## 2018-10-31 ENCOUNTER — Ambulatory Visit (INDEPENDENT_AMBULATORY_CARE_PROVIDER_SITE_OTHER): Payer: Medicare HMO | Admitting: Internal Medicine

## 2018-10-31 VITALS — BP 144/88 | HR 66 | Temp 98.4°F | Ht 67.6 in | Wt 194.4 lb

## 2018-10-31 DIAGNOSIS — Z1211 Encounter for screening for malignant neoplasm of colon: Secondary | ICD-10-CM | POA: Diagnosis not present

## 2018-10-31 DIAGNOSIS — Z0000 Encounter for general adult medical examination without abnormal findings: Secondary | ICD-10-CM | POA: Diagnosis not present

## 2018-10-31 DIAGNOSIS — E119 Type 2 diabetes mellitus without complications: Secondary | ICD-10-CM | POA: Diagnosis not present

## 2018-10-31 DIAGNOSIS — E785 Hyperlipidemia, unspecified: Secondary | ICD-10-CM | POA: Diagnosis not present

## 2018-10-31 DIAGNOSIS — Z23 Encounter for immunization: Secondary | ICD-10-CM | POA: Diagnosis not present

## 2018-10-31 DIAGNOSIS — L989 Disorder of the skin and subcutaneous tissue, unspecified: Secondary | ICD-10-CM

## 2018-10-31 DIAGNOSIS — Z794 Long term (current) use of insulin: Secondary | ICD-10-CM

## 2018-10-31 DIAGNOSIS — I10 Essential (primary) hypertension: Secondary | ICD-10-CM

## 2018-10-31 DIAGNOSIS — Z125 Encounter for screening for malignant neoplasm of prostate: Secondary | ICD-10-CM | POA: Diagnosis not present

## 2018-10-31 LAB — POCT UA - MICROALBUMIN
Albumin/Creatinine Ratio, Urine, POC: 30
Creatinine, POC: 300 mg/dL
Microalbumin Ur, POC: 10 mg/L

## 2018-10-31 LAB — POCT URINALYSIS DIPSTICK
Bilirubin, UA: NEGATIVE
Blood, UA: NEGATIVE
Glucose, UA: NEGATIVE
Ketones, UA: NEGATIVE
Leukocytes, UA: NEGATIVE
Nitrite, UA: NEGATIVE
Protein, UA: NEGATIVE
Spec Grav, UA: 1.025 (ref 1.010–1.025)
Urobilinogen, UA: 0.2 E.U./dL
pH, UA: 5.5 (ref 5.0–8.0)

## 2018-10-31 LAB — POC HEMOCCULT BLD/STL (OFFICE/1-CARD/DIAGNOSTIC): Fecal Occult Blood, POC: NEGATIVE

## 2018-10-31 NOTE — Progress Notes (Signed)
Subjective:     Patient ID: Gregory Friedman , male    DOB: [birth year] , 68 y.o.   MRN: [id number]   Chief Complaint  Patient presents with  . Annual Exam    HPI Is here for yearly physical. His dizziness was better after I saw him and went to PT twice and now is gone. Had little sleep last night and only slept 3h. Admits he was having thoughts of worry due to house issues. The only thing he wants me to check is his L knee which he had a dime size spot which in the past year has grown to larger than dollar sign.   DM FU Has brought his glucose diary and states he started taking a supplement to help his glucose and since then his fasting glucose has been in the low 100's, the highest was 125, but admits he had pancakes the day before. Has been taking at 35 U of his insulin.   Past Medical History:  Diagnosis Date  . Anxiety   . BPH (benign prostatic hyperplasia)   . Cancer ([place]) 2014   neck area  . Colon polyps   . Coronary artery disease   . Depression   . Diverticulosis   . GERD (gastroesophageal reflux disease)   . History of gout    toe  . 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 Hyperlipidemia   . Hypertension    states under control with med., has been on med. since 2003  . 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 Myocardial infarction ([place]) 2003  . Non-insulin dependent type 2 diabetes mellitus ([place])   . PONV (postoperative nausea and vomiting)   . S/P radiation therapy    Received 8 fractions, then stopped due to mucositis with Radiation/ Cisplatin  . Status post dilation of esophageal narrowing   . Trigger thumb of right hand 01/2016     Family History  Problem Relation Age of Onset  . Stroke Sister   . Diabetes Brother   . Seizures Mother   . Asthma Father   . Thyroid cancer Daughter   . Diabetes Sister   . Parkinson's disease Brother   . Heart disease Brother   .  Diabetes Brother   . Colon cancer Neg Hx       Current Outpatient Medications:  .  Accu-Chek FastClix Lancets MISC, CHECK BLOOD SUGAR ONE TIME DAILY AS DIRECTED, Disp: 150 each, Rfl: 2 .  antiseptic oral rinse (BIOTENE) LIQD, 15 mLs by Mouth Rinse route daily as needed for dry mouth. , Disp: , Rfl:  .  aspirin EC 81 MG tablet, Take 81 mg by mouth daily., Disp: , Rfl:  .  co-enzyme Q-10 30 MG capsule, Take 30 mg by mouth daily., Disp: , Rfl:  .  enalapril (VASOTEC) 10 MG tablet, TAKE 1 TABLET EVERY DAY, Disp: 90 tablet, Rfl: 1 .  glucose blood (ACCU-CHEK GUIDE) test strip, 1 each by Other route 2 (two) times daily. Use as instructed, Disp: 200 each, Rfl: 12 .  insulin degludec (TRESIBA FLEXTOUCH) 100 UNIT/ML SOPN FlexTouch Pen, Inject 40   Units subcutaneously daily at bedtime, Disp: 15 pen, Rfl: 3 .  meclizine (ANTIVERT) 25 MG tablet, Take 1 tablet (25 mg total) by mouth 3 (three) times daily. For dizziness, Disp: 30 tablet, Rfl: 0 .  metoprolol succinate (TOPROL-XL) 25 MG 24 hr tablet, Take  2 tablets (50 mg total) by mouth daily., Disp: 180 tablet, Rfl: 3 .  Multiple Vitamins-Minerals (MULTIVITAMIN WITH MINERALS) tablet, Take 1 tablet by mouth 2 (two) times daily. , Disp: , Rfl:  .  nitroGLYCERIN (NITROSTAT) 0.4 MG SL tablet, Place 1 tablet (0.4 mg total) under the tongue every 5 (five) minutes as needed. Chest pain, Disp: 25 tablet, Rfl: 3 .  omeprazole (PRILOSEC) 40 MG capsule, TAKE 1 CAPSULE EVERY DAY BEFORE A MEAL (Patient taking differently: 40 mg daily as needed. ), Disp: 90 capsule, Rfl: 0 .  Saw Palmetto, [place]ren[place] repens, 320 MG CAPS, Take 1 capsule by mouth daily. , Disp: , Rfl:  .  simvastatin (ZOCOR) 40 MG tablet, Take 1 tablet (40 mg total) by mouth every evening., Disp: 90 tablet, Rfl: 1   Allergies  Allergen Reactions  . Shellfish Allergy Swelling    In toes     Review of Systems  Mild low back aches, L knee skin lesion getting larger, gets mild positional dizziness which is  not new, the rest of ROS is neg.  Today's Vitals   10/31/18 1025  BP: (!) 144/88  Pulse: 66  Temp: 98.4 F (36.9 C)  TempSrc: Oral  Weight: 194 lb 6.4 oz (88.2 kg)  Height: 5' 7.6" (1.717 m)   Body mass index is 29.91 kg/m.  Repeated BP 160/90 Objective:  Physical Exam     BP (!) 144/88 (BP Location: Left Arm, Patient Position: Sitting, Cuff Size: Normal)   Pulse 66   Temp 98.4 F (36.9 C) (Oral)   Ht 5' 7.6" (1.717 m)   Wt 194 lb 6.4 oz (88.2 kg)   BMI 29.91 kg/m   General Appearance:    Alert, cooperative, no distress, appears stated age  Head:    Normocephalic, without obvious abnormality, atraumatic  Eyes:    PERRL, conjunctiva/corneas clear, EOM's intact,          Ears:    Normal TM's and external ear canals, both ears  Nose:   Nares normal, septum midline, mucosa normal, no drainage   or sinus tenderness  Throat:   Lips, mucosa, and tongue normal; teeth and gums normal  Neck:   Supple, symmetrical, trachea midline, no adenopathy;       thyroid:  No enlargement/tenderness/nodules; no carotid   bruit.  Back:     Symmetric, no curvature, ROM normal, no CVA tenderness  Lungs:     Clear to auscultation bilaterally, respirations unlabored  Chest wall:    No tenderness or deformity  Heart:    Regular rate and rhythm, S1 and S2 normal, no murmur, rub   or gallop  Abdomen:     Soft, non-tender, bowel sounds active all four quadrants,    no masses, no organomegaly  Genitalia:    Uncircumcised. Normal male without lesion, discharge or tenderness, no testicular masses.   Rectal:    Normal tone, normal prostate, no masses or tenderness;   guaiac negative stool  Extremities:   Extremities normal, atraumatic, no cyanosis or edema  Pulses:   2+ and symmetric all extremities  Skin:   Skin color, texture, turgor normal, no rashes or lesions. No calluses or wounds noted on feet. Has a dollar size slightly raised erythematous circular skin lesion with scaling dry skin in the  center. Not tender.   Lymph nodes:   Cervical, supraclavicular, and axillary nodes normal  Neurologic:   CNII-XII intact. Normal strength, sensation and reflexes      Throughout. Normal  Romberg, tandem gait, heel and tip toe gait. Normal sharp and dull sensation of feet.    Assessment And Plan:  1. Diabetes mellitus type 2, insulin dependent ([place])- with - POCT Urinalysis Dipstick [id number]) - POCT UA - Microalbumin   Fu in November.  2. Essential hypertension- elevated more likely due to lack of sleep and worry - CBC with Diff - CMP14 + Anion Gap   May continue current meds. FU in 3 moths.  3. Screening for prostate cancer- screen - PSA - Lipid Profile  4. Hyperlipidemia, unspecified hyperlipidemia type- screen - TSH - T4, Free - T3, free  5. Routine medical exam- routine/ FU 1 y  21. Immunization due- routine. I sent the noted vaccines to his pharmacy.Says he had one of the pneumonia vaccines before.  - Varicella-zoster vaccine subcutaneous - Pneumococcal conjugate vaccine 13-valent   He wants to hold off on getting the flu shot til later in the Fall.  7. Screen for colon cancer- screen - POC Hemoccult Bld/Stl (1-Cd Office Dx)  8. Skin lesion- chronic, on L knee. He declined referral to derm, or me doing a punch biopsy.    Marketia Stallsmith RODRIGUEZ-SOUTHWORTH, PA-C    THE PATIENT IS ENCOURAGED TO PRACTICE SOCIAL DISTANCING DUE TO THE COVID-19 PANDEMIC.

## 2018-11-01 LAB — CMP14 + ANION GAP
ALT: 15 IU/L (ref 0–44)
AST: 24 IU/L (ref 0–40)
Albumin/Globulin Ratio: 1.6 (ref 1.2–2.2)
Albumin: 4.8 g/dL (ref 3.8–4.8)
Alkaline Phosphatase: 66 IU/L (ref 39–117)
Anion Gap: 13 mmol/L (ref 10.0–18.0)
BUN/Creatinine Ratio: 10 (ref 10–24)
BUN: 16 mg/dL (ref 8–27)
Bilirubin Total: 0.5 mg/dL (ref 0.0–1.2)
CO2: 28 mmol/L (ref 20–29)
Calcium: 9.8 mg/dL (ref 8.6–10.2)
Chloride: 100 mmol/L (ref 96–106)
Creatinine, Ser: 1.58 mg/dL — ABNORMAL HIGH (ref 0.76–1.27)
GFR calc Af Amer: 51 mL/min/{1.73_m2} — ABNORMAL LOW (ref >59)
GFR calc non Af Amer: 44 mL/min/{1.73_m2} — ABNORMAL LOW (ref >59)
Globulin, Total: 3 g/dL (ref 1.5–4.5)
Glucose: 130 mg/dL — ABNORMAL HIGH (ref 65–99)
Potassium: 4.4 mmol/L (ref 3.5–5.2)
Sodium: 141 mmol/L (ref 134–144)
Total Protein: 7.8 g/dL (ref 6.0–8.5)

## 2018-11-01 LAB — LIPID PANEL
Chol/HDL Ratio: 3.3 ratio (ref 0.0–5.0)
Cholesterol, Total: 144 mg/dL (ref 100–199)
HDL: 43 mg/dL (ref >39)
LDL Chol Calc (NIH): 79 mg/dL (ref 0–99)
Triglycerides: 125 mg/dL (ref 0–149)
VLDL Cholesterol Cal: 22 mg/dL (ref 5–40)

## 2018-11-01 LAB — T3, FREE: T3, Free: 3.5 pg/mL (ref 2.0–4.4)

## 2018-11-01 LAB — CBC WITH DIFFERENTIAL/PLATELET
Basophils Absolute: 0.1 10*3/uL (ref 0.0–0.2)
Basos: 2 %
EOS (ABSOLUTE): 0.4 10*3/uL (ref 0.0–0.4)
Eos: 8 %
Hematocrit: 42.2 % (ref 37.5–51.0)
Hemoglobin: 14.3 g/dL (ref 13.0–17.7)
Immature Grans (Abs): 0 10*3/uL (ref 0.0–0.1)
Immature Granulocytes: 0 %
Lymphocytes Absolute: 1.7 10*3/uL (ref 0.7–3.1)
Lymphs: 31 %
MCH: 27.5 pg (ref 26.6–33.0)
MCHC: 33.9 g/dL (ref 31.5–35.7)
MCV: 81 fL (ref 79–97)
Monocytes Absolute: 0.6 10*3/uL (ref 0.1–0.9)
Monocytes: 10 %
Neutrophils Absolute: 2.8 10*3/uL (ref 1.4–7.0)
Neutrophils: 49 %
Platelets: 194 10*3/uL (ref 150–450)
RBC: 5.2 x10E6/uL (ref 4.14–5.80)
RDW: 13.1 % (ref 11.6–15.4)
WBC: 5.6 10*3/uL (ref 3.4–10.8)

## 2018-11-01 LAB — T4, FREE: Free T4: 1.13 ng/dL (ref 0.82–1.77)

## 2018-11-01 LAB — TSH: TSH: 4.08 u[IU]/mL (ref 0.450–4.500)

## 2018-11-01 LAB — PSA: Prostate Specific Ag, Serum: 0.9 ng/mL (ref 0.0–4.0)

## 2018-11-03 ENCOUNTER — Encounter: Payer: Self-pay | Admitting: Internal Medicine

## 2018-11-04 ENCOUNTER — Telehealth: Payer: Medicare HMO | Admitting: Pharmacist

## 2018-11-12 ENCOUNTER — Ambulatory Visit (INDEPENDENT_AMBULATORY_CARE_PROVIDER_SITE_OTHER): Payer: Medicare HMO | Admitting: Pharmacist

## 2018-11-12 DIAGNOSIS — Z794 Long term (current) use of insulin: Secondary | ICD-10-CM

## 2018-11-12 DIAGNOSIS — E119 Type 2 diabetes mellitus without complications: Secondary | ICD-10-CM | POA: Diagnosis not present

## 2018-11-12 DIAGNOSIS — E785 Hyperlipidemia, unspecified: Secondary | ICD-10-CM | POA: Diagnosis not present

## 2018-11-12 DIAGNOSIS — I10 Essential (primary) hypertension: Secondary | ICD-10-CM | POA: Diagnosis not present

## 2018-11-18 NOTE — Progress Notes (Signed)
Chronic Care Management   Visit Note  11/12/2018 Name: Gregory Friedman MRN: [id number] DOB: 08-Apr-1950  Referred by: Shelby Mattocks, PA-C Reason for referral : Chronic Care Management   Gregory Friedman is a 68 y.o. year old male who is a primary care patient of [place], [facility]ill[facility], [place]. The CCM team was consulted for assistance with chronic disease management and care coordination needs related to HLD and DMII  Review of patient status, including review of consultants reports, relevant laboratory and other test results, and collaboration with appropriate care team members and the patient's provider was performed as part of comprehensive patient evaluation and provision of chronic care management services.    I spoke with Gregory Friedman by telephone today.  Advanced Directives Status: N See Care Plan and Vynca application for related entries.   Medications: Outpatient Encounter Medications as of 11/12/2018  Medication Sig  . Accu-Chek FastClix Lancets MISC CHECK BLOOD SUGAR ONE TIME DAILY AS DIRECTED  . antiseptic oral rinse (BIOTENE) LIQD 15 mLs by Mouth Rinse route daily as needed for dry mouth.   [name] aspirin EC 81 MG tablet Take 81 mg by mouth daily.  [name] co-enzyme Q-10 30 MG capsule Take 30 mg by mouth daily.  . enalapril (VASOTEC) 10 MG tablet TAKE 1 TABLET EVERY DAY  . glucose blood (ACCU-CHEK GUIDE) test strip 1 each by Other route 2 (two) times daily. Use as instructed  . insulin degludec (TRESIBA FLEXTOUCH) 100 UNIT/ML SOPN FlexTouch Pen Inject 40   Units subcutaneously daily at bedtime  . meclizine (ANTIVERT) 25 MG tablet Take 1 tablet (25 mg total) by mouth 3 (three) times daily. For dizziness  . metoprolol succinate (TOPROL-XL) 25 MG 24 hr tablet Take 2 tablets (50 mg total) by mouth daily.  . Multiple Vitamins-Minerals (MULTIVITAMIN WITH MINERALS) tablet Take 1 tablet by mouth 2 (two) times daily.   . nitroGLYCERIN (NITROSTAT) 0.4 MG SL tablet  Place 1 tablet (0.4 mg total) under the tongue every 5 (five) minutes as needed. Chest pain  . omeprazole (PRILOSEC) 40 MG capsule TAKE 1 CAPSULE EVERY DAY BEFORE A MEAL (Patient taking differently: 40 mg daily as needed. )  . Saw Palmetto, Serenoa repens, 320 MG CAPS Take 1 capsule by mouth daily.   . simvastatin (ZOCOR) 40 MG tablet Take 1 tablet (40 mg total) by mouth every evening.   No facility-administered encounter medications on file as of 11/12/2018.      Objective:   Goals Addressed            This Visit's Progress     Patient Stated   . "I would like to talk to you about my diabetes" (pt-stated)       Current Barriers:  [name] Knowledge Deficits related to disease process and self health management for Diabetes  Nurse Case Manager & PharmD Clinical Goal(s):  [name] Over the next 90 days, patient will demonstrate improved adherence to his prescribed treatment plan for Diabetes Management as evidenced by verbalizing understanding and adherence to monitoring CBGs, taking insulin as prescribed and improved adherence to following a Diabetic diet. 08/30/18: Goal date revised to 90 days due to delays in treatment secondary to COVID-19.   CCM RN CM Interventions:   Telephone CCM follow up completed with patient last month . Evaluation of current treatment plan related to Diabetes and patient's adherence to plan as established by provider. . Discussed patient received and reviewed the mailed diabetes printed ed mats-he denies questions . Discussed additional printed ed mats to  be mailed related Meal Planning using the Plate Method    Reviewed and discussed adherence to taking prescribed insulin; pt noted he switched from taking his insulin from the pm to the am and is noticing his BS are within normal limits  Discussed plans with patient for ongoing care management follow up and provided patient with direct contact information for care management team  Scheduled a CCM telephone follow up  with patient in about 2 weeks  Sent pharmacy referral to [place] to further evaluate insulin regimen-pt aware   CCM PharmD Interventions: Telephone CCM follow up completed with patient today 11/12/18 . Comprehensive medication review performed and list updated in the EMR.  No changes to current regimen . Patient is tolerating therapy well and denies adverse events. . His FBG this AM was 103.  His last A1c is still holding at 8.2% on 10/31/18 (A1c has remained 8.2% x3 this year).  He denies hypoglycemia (BG<70).  He reports that most of his FBGs range from 90-100s depending on his night time snacking habits.   . Reviewed meal planning and how to find alternatives to snacks with higher carbohydrates.  He states he has decreased his ice cream and potato chip intake.  Reminded patient to avoid eating too many bananas.  He has been eating 1-2 bananas a day.  He is more mindful of snacking and is avoiding foods with higher carbohydrate intake.  Encouraged patient to increase water intake and begin to exercise as able and as recommended by PCP. [name] Patient continues to inject 35 units daily of Tresiba as monotherapy for his diabetes.  He injects [place] in the mornings and this has proven to work well with his schedule.  Encouraged patient to continue his great progress.  Patient states he needs to increase his activity level . Patient has been approved for free Tresiba through [employer] patient assistance program until 01/23/19.  This will help him work to achieve target A1c~7%. . Consider GLP (Rybelsus) if A1c target not attainable on Tresiba monotherapy. . Reminded patient of upcoming visit on 12/05/18.  Will follow up with patient at that time.  Patient Self Care Activities:  . Self administers medications as prescribed . Attends all scheduled provider appointments . Calls pharmacy for medication refills . Performs ADL's independently . Performs IADL's independently . Calls provider  office for new concerns or questions  Please see past updates related to this goal by clicking on the "Past Updates" button in the selected goal           Plan:   Face to Face appointment with care management team member scheduled for: 12/05/2018  Provider Signature Regina Eck, PharmD, BCPS Clinical Pharmacist, [facility] [place]: [phone]

## 2018-11-18 NOTE — Patient Instructions (Signed)
Visit Information  Goals Addressed            This Visit's Progress     Patient Stated   . "I would like to talk to you about my diabetes" (pt-stated)       Current Barriers:  [name] Knowledge Deficits related to disease process and self health management for Diabetes  Nurse Case Manager & PharmD Clinical Goal(s):  [name] Over the next 90 days, patient will demonstrate improved adherence to his prescribed treatment plan for Diabetes Management as evidenced by verbalizing understanding and adherence to monitoring CBGs, taking insulin as prescribed and improved adherence to following a Diabetic diet. 08/30/18: Goal date revised to 90 days due to delays in treatment secondary to COVID-19.   CCM RN CM Interventions:   Telephone CCM follow up completed with patient last month . Evaluation of current treatment plan related to Diabetes and patient's adherence to plan as established by provider. . Discussed patient received and reviewed the mailed diabetes printed ed mats-he denies questions . Discussed additional printed ed mats to be mailed related Meal Planning using the Plate Method    Reviewed and discussed adherence to taking prescribed insulin; pt noted he switched from taking his insulin from the pm to the am and is noticing his BS are within normal limits  Discussed plans with patient for ongoing care management follow up and provided patient with direct contact information for care management team  Scheduled a CCM telephone follow up with patient in about 2 weeks  Sent pharmacy referral to [place] [facility] to further evaluate insulin regimen-pt aware   CCM PharmD Interventions: Telephone CCM follow up completed with patient today 11/12/18 . Comprehensive medication review performed and list updated in the EMR.  No changes to current regimen . Patient is tolerating therapy well and denies adverse events. . His FBG this AM was 103.  His last A1c is still holding at 8.2% on 10/31/18 (A1c  has remained 8.2% x3 this year).  He denies hypoglycemia (BG<70).  He reports that most of his FBGs range from 90-100s depending on his night time snacking habits.   . Reviewed meal planning and how to find alternatives to snacks with higher carbohydrates.  He states he has decreased his ice cream and potato chip intake.  Reminded patient to avoid eating too many bananas.  He has been eating 1-2 bananas a day.  He is more mindful of snacking and is avoiding foods with higher carbohydrate intake.  Encouraged patient to increase water intake and begin to exercise as able and as recommended by PCP. [name] Patient continues to inject 35 units daily of Tresiba as monotherapy for his diabetes.  He injects [place] in the mornings and this has proven to work well with his schedule.  Encouraged patient to continue his great progress.  Patient states he needs to increase his activity level . Patient has been approved for free Tresiba through [employer] patient assistance program until 01/23/19.  This will help him work to achieve target A1c~7%. . Consider GLP (Rybelsus) if A1c target not attainable on Tresiba monotherapy. . Reminded patient of upcoming visit on 12/05/18.  Will follow up with patient at that time.  Patient Self Care Activities:  . Self administers medications as prescribed . Attends all scheduled provider appointments . Calls pharmacy for medication refills . Performs ADL's independently . Performs IADL's independently . Calls provider office for new concerns or questions  Please see past updates related to this goal  by clicking on the "Past Updates" button in the selected goal          The patient verbalized understanding of instructions provided today and declined a print copy of patient instruction materials.   The care management team will reach out to the patient again over the next 30 days.   SIGNATURE Regina Eck, PharmD, BCPS Clinical Pharmacist, [facility]: [phone]

## 2018-11-22 ENCOUNTER — Telehealth: Payer: Medicare HMO

## 2018-11-29 ENCOUNTER — Ambulatory Visit (INDEPENDENT_AMBULATORY_CARE_PROVIDER_SITE_OTHER): Payer: Medicare HMO | Admitting: Pharmacist

## 2018-11-29 DIAGNOSIS — I10 Essential (primary) hypertension: Secondary | ICD-10-CM | POA: Diagnosis not present

## 2018-11-29 DIAGNOSIS — E119 Type 2 diabetes mellitus without complications: Secondary | ICD-10-CM

## 2018-11-29 DIAGNOSIS — Z794 Long term (current) use of insulin: Secondary | ICD-10-CM

## 2018-12-03 NOTE — Progress Notes (Signed)
Chronic Care Management   Visit Note  11/29/2018 Name: Gregory Friedman MRN: [id number] DOB: [birth year]  Referred by: Shelby Mattocks, PA-C Reason for referral : Chronic Care Management   Gregory Friedman is a 68 y.o. year old male who is a primary care patient of [place], Sunday Spillers, [place]. The CCM team was consulted for assistance with chronic disease management and care coordination needs related to DMII  Review of patient status, including review of consultants reports, relevant laboratory and other test results, and collaboration with appropriate care team members and the patient's provider was performed as part of comprehensive patient evaluation and provision of chronic care management services.    I spoke with Gregory Friedman by telephone today.  Medications: Outpatient Encounter Medications as of 11/29/2018  Medication Sig  . Accu-Chek FastClix Lancets MISC CHECK BLOOD SUGAR ONE TIME DAILY AS DIRECTED  . antiseptic oral rinse (BIOTENE) LIQD 15 mLs by Mouth Rinse route daily as needed for dry mouth.   [name] aspirin EC 81 MG tablet Take 81 mg by mouth daily.  [name] co-enzyme Q-10 30 MG capsule Take 30 mg by mouth daily.  . enalapril (VASOTEC) 10 MG tablet TAKE 1 TABLET EVERY DAY  . glucose blood (ACCU-CHEK GUIDE) test strip 1 each by Other route 2 (two) times daily. Use as instructed  . insulin degludec (TRESIBA FLEXTOUCH) 100 UNIT/ML SOPN FlexTouch Pen Inject 40   Units subcutaneously daily at bedtime  . meclizine (ANTIVERT) 25 MG tablet Take 1 tablet (25 mg total) by mouth 3 (three) times daily. For dizziness  . metoprolol succinate (TOPROL-XL) 25 MG 24 hr tablet Take 2 tablets (50 mg total) by mouth daily.  . Multiple Vitamins-Minerals (MULTIVITAMIN WITH MINERALS) tablet Take 1 tablet by mouth 2 (two) times daily.   . nitroGLYCERIN (NITROSTAT) 0.4 MG SL tablet Place 1 tablet (0.4 mg total) under the tongue every 5 (five) minutes as needed. Chest pain  .  omeprazole (PRILOSEC) 40 MG capsule TAKE 1 CAPSULE EVERY DAY BEFORE A MEAL (Patient taking differently: 40 mg daily as needed. )  . Saw Palmetto, [place]ren[place] repens, 320 MG CAPS Take 1 capsule by mouth daily.   . simvastatin (ZOCOR) 40 MG tablet Take 1 tablet (40 mg total) by mouth every evening.   No facility-administered encounter medications on file as of 11/29/2018.      Objective:   Goals Addressed            This Visit's Progress     Patient Stated   . "I would like to talk to you about my diabetes" (pt-stated)       Current Barriers:  [name] Knowledge Deficits related to disease process and self health management for Diabetes  Nurse Case Manager & PharmD Clinical Goal(s):  [name] Over the next 90 days, patient will demonstrate improved adherence to his prescribed treatment plan for Diabetes Management as evidenced by verbalizing understanding and adherence to monitoring CBGs, taking insulin as prescribed and improved adherence to following a Diabetic diet. 08/30/18: Goal date revised to 90 days due to delays in treatment secondary to COVID-19.   CCM RN CM Interventions:   Telephone CCM follow up completed with patient last month . Evaluation of current treatment plan related to Diabetes and patient's adherence to plan as established by provider. . Discussed patient received and reviewed the mailed diabetes printed ed mats-he denies questions . Discussed additional printed ed mats to be mailed related Meal Planning using the Plate Method    Reviewed and discussed adherence to  taking prescribed insulin; pt noted he switched from taking his insulin from the pm to the am and is noticing his BS are within normal limits  Discussed plans with patient for ongoing care management follow up and provided patient with direct contact information for care management team  Scheduled a CCM telephone follow up with patient in about 2 weeks  Sent pharmacy referral to [place] [facility] to further evaluate  insulin regimen-pt aware  CCM PharmD Interventions: Telephone CCM follow up completed with patient today 11/29/18 . Comprehensive medication review performed and list updated in the EMR.  No changes to current regimen . Patient is tolerating therapy well and denies adverse events. . His FBG this AM was 110.  He reports FBG as low as 89, but not higher than 130. His last A1c is still holding at 8.2% on 10/31/18 (A1c has remained 8.2% x3 this year).  He denies hypoglycemia (BG<70).  He reports that most of his FBGs range from 90-100s depending on his night time snacking habits.   . Reviewed meal planning and how to find alternatives to snacks with higher carbohydrates.  He states he has decreased his ice cream and potato chip intake.  Reminded patient to avoid eating too many bananas.  He has been eating 1-2 bananas a day.  He is more mindful of snacking and is avoiding foods with higher carbohydrate intake.  Encouraged patient to increase water intake and begin to exercise as able and as recommended by PCP. [name] Patient continues to inject 35 units daily of Tresiba as monotherapy for his diabetes.  He injects [place] in the mornings and this has proven to work well with his schedule.  Encouraged patient to continue his great progress.  Patient states he needs to increase his activity level . Patient has been approved for free Tresiba through [employer] patient assistance program until 01/23/19.  This will help him work to achieve target A1c~7%. . Consider GLP (Rybelsus) if A1c target not attainable on Tresiba monotherapy. . Reminded patient of upcoming visit on 12/05/18.  Will follow up with patient at that time.  Patient Self Care Activities:  . Self administers medications as prescribed . Attends all scheduled provider appointments . Calls pharmacy for medication refills . Performs ADL's independently . Performs IADL's independently . Calls provider office for new concerns or questions  Please see  past updates related to this goal by clicking on the "Past Updates" button in the selected goal          Plan:   The care management team will reach out to the patient again over the next 6 weeks.   Provider Signature Regina Eck, PharmD, BCPS Clinical Pharmacist, [facility] [place]: [phone]

## 2018-12-03 NOTE — Patient Instructions (Signed)
Visit Information  Goals Addressed            This Visit's Progress     Patient Stated   . "I would like to talk to you about my diabetes" (pt-stated)       Current Barriers:  [name] Knowledge Deficits related to disease process and self health management for Diabetes  Nurse Case Manager & PharmD Clinical Goal(s):  [name] Over the next 90 days, patient will demonstrate improved adherence to his prescribed treatment plan for Diabetes Management as evidenced by verbalizing understanding and adherence to monitoring CBGs, taking insulin as prescribed and improved adherence to following a Diabetic diet. 08/30/18: Goal date revised to 90 days due to delays in treatment secondary to COVID-19.   CCM RN CM Interventions:   Telephone CCM follow up completed with patient last month . Evaluation of current treatment plan related to Diabetes and patient's adherence to plan as established by provider. . Discussed patient received and reviewed the mailed diabetes printed ed mats-he denies questions . Discussed additional printed ed mats to be mailed related Meal Planning using the Plate Method    Reviewed and discussed adherence to taking prescribed insulin; pt noted he switched from taking his insulin from the pm to the am and is noticing his BS are within normal limits  Discussed plans with patient for ongoing care management follow up and provided patient with direct contact information for care management team  Scheduled a CCM telephone follow up with patient in about 2 weeks  Sent pharmacy referral to [facility] to further evaluate insulin regimen-pt aware  CCM PharmD Interventions: Telephone CCM follow up completed with patient today 11/29/18 . Comprehensive medication review performed and list updated in the EMR.  No changes to current regimen . Patient is tolerating therapy well and denies adverse events. . His FBG this AM was 110.  He reports FBG as low as 89, but not higher than 130. His  last A1c is still holding at 8.2% on 10/31/18 (A1c has remained 8.2% x3 this year).  He denies hypoglycemia (BG<70).  He reports that most of his FBGs range from 90-100s depending on his night time snacking habits.   . Reviewed meal planning and how to find alternatives to snacks with higher carbohydrates.  He states he has decreased his ice cream and potato chip intake.  Reminded patient to avoid eating too many bananas.  He has been eating 1-2 bananas a day.  He is more mindful of snacking and is avoiding foods with higher carbohydrate intake.  Encouraged patient to increase water intake and begin to exercise as able and as recommended by PCP. [name] Patient continues to inject 35 units daily of Tresiba as monotherapy for his diabetes.  He injects [place] in the mornings and this has proven to work well with his schedule.  Encouraged patient to continue his great progress.  Patient states he needs to increase his activity level . Patient has been approved for free Tresiba through [employer] patient assistance program until 01/23/19.  This will help him work to achieve target A1c~7%. . Consider GLP (Rybelsus) if A1c target not attainable on Tresiba monotherapy. . Reminded patient of upcoming visit on 12/05/18.  Will follow up with patient at that time.  Patient Self Care Activities:  . Self administers medications as prescribed . Attends all scheduled provider appointments . Calls pharmacy for medication refills . Performs ADL's independently . Performs IADL's independently . Calls provider office for new concerns  or questions  Please see past updates related to this goal by clicking on the "Past Updates" button in the selected goal          The patient verbalized understanding of instructions provided today and declined a print copy of patient instruction materials.   The care management team will reach out to the patient again over the next 6 weeks.  SIGNATURE Regina Eck, PharmD,  BCPS Clinical Pharmacist, [facility] [place]: [phone]

## 2018-12-05 ENCOUNTER — Encounter: Payer: Self-pay | Admitting: Internal Medicine

## 2018-12-05 ENCOUNTER — Ambulatory Visit (INDEPENDENT_AMBULATORY_CARE_PROVIDER_SITE_OTHER): Payer: Medicare HMO | Admitting: Internal Medicine

## 2018-12-05 ENCOUNTER — Telehealth: Payer: Medicare HMO

## 2018-12-05 ENCOUNTER — Other Ambulatory Visit: Payer: Self-pay

## 2018-12-05 VITALS — BP 156/78 | HR 58 | Temp 98.2°F | Ht 67.4 in | Wt 194.0 lb

## 2018-12-05 DIAGNOSIS — L989 Disorder of the skin and subcutaneous tissue, unspecified: Secondary | ICD-10-CM

## 2018-12-05 DIAGNOSIS — Z794 Long term (current) use of insulin: Secondary | ICD-10-CM | POA: Diagnosis not present

## 2018-12-05 DIAGNOSIS — I10 Essential (primary) hypertension: Secondary | ICD-10-CM | POA: Diagnosis not present

## 2018-12-05 DIAGNOSIS — E119 Type 2 diabetes mellitus without complications: Secondary | ICD-10-CM | POA: Diagnosis not present

## 2018-12-05 DIAGNOSIS — Z23 Encounter for immunization: Secondary | ICD-10-CM

## 2018-12-05 NOTE — Progress Notes (Signed)
Subjective:     Patient ID: Gregory Friedman , male    DOB: Jun 10, 1950 , 68 y.o.   MRN: [id number]   Chief Complaint  Patient presents with  . Diabetes    HPI Pt is here for DM FU and been using 40 Units of Tresiba qd. His glucose have been much better.  Glucose 99-129 fasting BP-119/63-139/51-63. Is ready to have skin lesion L knee biopsied, and also wants me to look at his scalp lesion which itches him and scabs off and on x 10 years. His cardiologist mentioned to him a couple of years ago, he should see Derm, but he was never sent. He also has other keratotic areas on L face and had on R forearm for which he has a topical cream he applied on his R forearm and resolved.  Past Medical History:  Diagnosis Date  . Anxiety   . BPH (benign prostatic hyperplasia)   . Cancer ([place]) 2014   neck area  . Colon polyps   . Coronary artery disease   . Depression   . Diverticulosis   . GERD (gastroesophageal reflux disease)   . History of gout    toe  . 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 Hyperlipidemia   . Hypertension    states under control with med., has been on med. since 2003  . 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 Myocardial infarction ([place]) 2003  . Non-insulin dependent type 2 diabetes mellitus ([place])   . PONV (postoperative nausea and vomiting)   . S/P radiation therapy    Received 8 fractions, then stopped due to mucositis with Radiation/ Cisplatin  . Status post dilation of esophageal narrowing   . Trigger thumb of right hand 01/2016     Family History  Problem Relation Age of Onset  . Stroke Sister   . Diabetes Brother   . Seizures Mother   . Asthma Father   . Thyroid cancer Daughter   . Diabetes Sister   . Parkinson's disease Brother   . Heart disease Brother   . Diabetes Brother   . Colon cancer Neg Hx      Current Outpatient Medications:  .  Accu-Chek FastClix  Lancets MISC, CHECK BLOOD SUGAR ONE TIME DAILY AS DIRECTED, Disp: 150 each, Rfl: 2 .  antiseptic oral rinse (BIOTENE) LIQD, 15 mLs by Mouth Rinse route daily as needed for dry mouth. , Disp: , Rfl:  .  aspirin EC 81 MG tablet, Take 81 mg by mouth daily., Disp: , Rfl:  .  co-enzyme Q-10 30 MG capsule, Take 30 mg by mouth daily., Disp: , Rfl:  .  enalapril (VASOTEC) 10 MG tablet, TAKE 1 TABLET EVERY DAY, Disp: 90 tablet, Rfl: 1 .  glucose blood (ACCU-CHEK GUIDE) test strip, 1 each by Other route 2 (two) times daily. Use as instructed, Disp: 200 each, Rfl: 12 .  insulin degludec (TRESIBA FLEXTOUCH) 100 UNIT/ML SOPN FlexTouch Pen, Inject 40   Units subcutaneously daily at bedtime, Disp: 15 pen, Rfl: 3 .  meclizine (ANTIVERT) 25 MG tablet, Take 1 tablet (25 mg total) by mouth 3 (three) times daily. For dizziness, Disp: 30 tablet, Rfl: 0 .  metoprolol succinate (TOPROL-XL) 25 MG 24 hr tablet, Take 2 tablets (50 mg total) by mouth daily., Disp: 180 tablet, Rfl: 3 .  nitroGLYCERIN (NITROSTAT) 0.4 MG SL tablet, Place 1 tablet (0.4 mg  total) under the tongue every 5 (five) minutes as needed. Chest pain, Disp: 25 tablet, Rfl: 3 .  omeprazole (PRILOSEC) 40 MG capsule, TAKE 1 CAPSULE EVERY DAY BEFORE A MEAL (Patient taking differently: 40 mg daily as needed. ), Disp: 90 capsule, Rfl: 0 .  Saw Palmetto, [place]renoa repens, 320 MG CAPS, Take 1 capsule by mouth daily. , Disp: , Rfl:  .  simvastatin (ZOCOR) 40 MG tablet, Take 1 tablet (40 mg total) by mouth every evening., Disp: 90 tablet, Rfl: 1 .  Multiple Vitamins-Minerals (MULTIVITAMIN WITH MINERALS) tablet, Take 1 tablet by mouth 2 (two) times daily. , Disp: , Rfl:    Allergies  Allergen Reactions  . Shellfish Allergy Swelling    In toes     Review of Systems  Review of Systems  Constitutional: Negative for diaphoresis and unexpected weight change.  HENT: Negative for tinnitus.   Eyes: Negative for visual disturbance.  Respiratory: Negative for chest  tightness and shortness of breath.   Cardiovascular: Negative for chest pain, palpitations and leg swelling.  Gastrointestinal: Negative for constipation, diarrhea and nausea.  Endocrine: Negative for polydipsia, polyphagia and polyuria.  Genitourinary: Negative for dysuria and frequency.  Skin: Negative for rash and wound.  Neurological: Negative for dizziness, speech difficulty, weakness, numbness and headaches.   Today's Vitals   12/05/18 1557  BP: (!) 150/88  Pulse: (!) 58  Temp: 98.2 F (36.8 C)  TempSrc: Oral  Weight: 194 lb (88 kg)  Height: 5' 7.4" (1.712 m)  PainSc: 0-No pain   Body mass index is 30.03 kg/m.   Objective:  Physical Exam   Constitutional: She is oriented to person, place, and time. he appears well-developed and well-nourished. No distress.  HENT:  Head: Normocephalic and atraumatic.  Right Ear: External ear normal.  Left Ear: External ear normal.  Nose: Nose normal.  Eyes: Conjunctivae are normal. Right eye exhibits no discharge. Left eye exhibits no discharge. No scleral icterus.  Neck: Neck supple. No thyromegaly present.  No carotid bruits bilaterally  Cardiovascular: Normal rate and regular rhythm.  No murmur heard. Pulmonary/Chest: Effort normal and breath sounds normal. No respiratory distress.  Musculoskeletal: Normal range of motion. She exhibits no edema.  Lymphadenopathy:    he has no cervical adenopathy.  Neurological: he is alert and oriented to person, place, and time.  Skin: Skin is warm and dry. Capillary refill takes less than 2 seconds. Has 4x4 silver circular patch on his L knee. Keratotic on L face of 2x2 cm, and on top of L scalp slightly thickened skin area where looks like a scab fell off. He is not diaphoretic.  Psychiatric: he has a normal mood and affect. Her behavior is normal. Judgment and thought content normal.  Nursing note reviewed.     Assessment And Plan:    1. Need for influenza vaccination- routine - Flu  vaccine HIGH DOSE PF (Fluzone High dose)  2. Diabetes mellitus type 2, insulin dependent ([place])- improved. May stay on current dose of Tresiba.  - Hemoglobin A1c  3. Skin lesion- multiple.  - Ambulatory referral to Dermatology  4. Essential hypertension- not well controled.     Sherby Moncayo RODRIGUEZ-SOUTHWORTH, PA-C    THE PATIENT IS ENCOURAGED TO PRACTICE SOCIAL DISTANCING DUE TO THE COVID-19 PANDEMIC.

## 2018-12-06 ENCOUNTER — Telehealth: Payer: Self-pay

## 2018-12-06 LAB — HEMOGLOBIN A1C
Est. average glucose Bld gHb Est-mCnc: 197 mg/dL
Hgb A1c MFr Bld: 8.5 % — ABNORMAL HIGH (ref 4.8–5.6)

## 2018-12-06 NOTE — Telephone Encounter (Signed)
Voicemail isn't set up  I was calling the pt to let him know that his [name] from [employer] patient assistance program has been delivered to the office.

## 2018-12-09 ENCOUNTER — Ambulatory Visit: Payer: Self-pay | Admitting: Pharmacist

## 2018-12-09 DIAGNOSIS — I10 Essential (primary) hypertension: Secondary | ICD-10-CM

## 2018-12-09 DIAGNOSIS — E785 Hyperlipidemia, unspecified: Secondary | ICD-10-CM

## 2018-12-09 DIAGNOSIS — E119 Type 2 diabetes mellitus without complications: Secondary | ICD-10-CM

## 2018-12-09 NOTE — Progress Notes (Signed)
  Chronic Care Management   Outreach Note  12/09/2018 Name: Gregory Friedman MRN: [id number] DOB: 1951/01/21  Referred by: Shelby Mattocks, PA-C Reason for referral : Chronic Care Management   An unsuccessful telephone outreach was attempted today. The patient was referred to the case management team by for assistance with care management and care coordination. Patient's most recent A1c was 8.6%.  Message route to PA-C to discuss optimization of diabetes regimen.  Could potentially add Rybelsus to medication regimen to aid in A1c lowering and CV benefits.  Follow Up Plan: The care management team will reach out to the patient again over the next 14 days.   SIGNATURE Regina Eck, PharmD, BCPS Clinical Pharmacist, [facility] [place]: [phone]

## 2018-12-10 DIAGNOSIS — D485 Neoplasm of uncertain behavior of skin: Secondary | ICD-10-CM | POA: Diagnosis not present

## 2018-12-10 DIAGNOSIS — L308 Other specified dermatitis: Secondary | ICD-10-CM | POA: Diagnosis not present

## 2018-12-10 DIAGNOSIS — L409 Psoriasis, unspecified: Secondary | ICD-10-CM | POA: Diagnosis not present

## 2018-12-12 DIAGNOSIS — L409 Psoriasis, unspecified: Secondary | ICD-10-CM | POA: Insufficient documentation

## 2018-12-18 ENCOUNTER — Other Ambulatory Visit: Payer: Self-pay | Admitting: Internal Medicine

## 2018-12-24 ENCOUNTER — Ambulatory Visit (INDEPENDENT_AMBULATORY_CARE_PROVIDER_SITE_OTHER): Payer: Medicare HMO | Admitting: Pharmacist

## 2018-12-24 DIAGNOSIS — E119 Type 2 diabetes mellitus without complications: Secondary | ICD-10-CM | POA: Diagnosis not present

## 2018-12-24 DIAGNOSIS — Z794 Long term (current) use of insulin: Secondary | ICD-10-CM

## 2018-12-27 ENCOUNTER — Ambulatory Visit: Payer: Medicare HMO

## 2018-12-30 NOTE — Progress Notes (Signed)
Chronic Care Management   Visit Note  12/24/2018 Name: Gregory Friedman MRN: [id number] DOB: December 06, 1950  Referred by: Shelby Mattocks, PA-C Reason for referral : Chronic Care Management   Gregory Friedman is a 68 y.o. year old male who is a primary care patient of [place], Sunday Spill[place], [place]. The CCM team was consulted for assistance with chronic disease management and care coordination needs related to DMII  Review of patient status, including review of consultants reports, relevant laboratory and other test results, and collaboration with appropriate care team members and the patient's provider was performed as part of comprehensive patient evaluation and provision of chronic care management services.    I spoke with [name] by telephone today  Medications: Outpatient Encounter Medications as of 12/24/2018  Medication Sig   Accu-Chek FastClix Lancets MISC CHECK BLOOD SUGAR ONE TIME DAILY AS DIRECTED   antiseptic oral rinse (BIOTENE) LIQD 15 mLs by Mouth Rinse route daily as needed for dry mouth.    aspirin EC 81 MG tablet Take 81 mg by mouth daily.   co-enzyme Q-10 30 MG capsule Take 30 mg by mouth daily.   enalapril (VASOTEC) 10 MG tablet TAKE 1 TABLET EVERY DAY   glucose blood (ACCU-CHEK GUIDE) test strip 1 each by Other route 2 (two) times daily. Use as instructed   insulin degludec (TRESIBA FLEXTOUCH) 100 UNIT/ML SOPN FlexTouch Pen Inject 40   Units subcutaneously daily at bedtime   meclizine (ANTIVERT) 25 MG tablet Take 1 tablet (25 mg total) by mouth 3 (three) times daily. For dizziness   metoprolol succinate (TOPROL-XL) 25 MG 24 hr tablet Take 2 tablets (50 mg total) by mouth daily.   Multiple Vitamins-Minerals (MULTIVITAMIN WITH MINERALS) tablet Take 1 tablet by mouth 2 (two) times daily.    nitroGLYCERIN (NITROSTAT) 0.4 MG SL tablet Place 1 tablet (0.4 mg total) under the tongue every 5 (five) minutes as needed. Chest pain    omeprazole (PRILOSEC) 40 MG capsule TAKE 1 CAPSULE EVERY DAY BEFORE A MEAL (Patient taking differently: 40 mg daily as needed. )   Saw Palmetto, [place]renoa repens, 320 MG CAPS Take 1 capsule by mouth daily.    simvastatin (ZOCOR) 40 MG tablet Take 1 tablet (40 mg total) by mouth every evening.   No facility-administered encounter medications on file as of 12/24/2018.      Objective:   Goals Addressed            This Visit's Progress     Patient Stated    "I would like to talk to you about my diabetes" (pt-stated)       Current Barriers:   Knowledge Deficits related to disease process and self health management for Diabetes  Nurse Case Manager & PharmD Clinical Goal(s):   Over the next 90 days, patient will demonstrate improved adherence to his prescribed treatment plan for Diabetes Management as evidenced by verbalizing understanding and adherence to monitoring CBGs, taking insulin as prescribed and improved adherence to following a Diabetic diet. 12/24/18: Goal date revised to 90 days due to delays in treatment secondary to COVID-19.   CCM RN CM Interventions:   Telephone CCM follow up completed with patient last month  Evaluation of current treatment plan related to Diabetes and patient's adherence to plan as established by provider.  Discussed patient received and reviewed the mailed diabetes printed ed mats-he denies questions  Discussed additional printed ed mats to be mailed related Meal Planning using the Plate Method    Reviewed and discussed adherence to  taking prescribed insulin; pt noted he switched from taking his insulin from the pm to the am and is noticing his BS are within normal limits  Discussed plans with patient for ongoing care management follow up and provided patient with direct contact information for care management team  Scheduled a CCM telephone follow up with patient in about 2 weeks  Sent pharmacy referral to [facility] to further  evaluate insulin regimen-pt aware  CCM PharmD Interventions: Telephone CCM follow up completed with patient on 12/24/18  Comprehensive medication review performed and list updated in the EMR.  No changes to current regimen  Patient is tolerating therapy well and denies adverse events.  His FBG this AM was 120.  His last A1c is 8.5% on 02/03/18 (A1c has remained 8.2% x3 this year).  He denies hypoglycemia (BG<70).  He reports that most of his FBGs range from 100-200s depending on his night time snacking habits.    Reviewed meal planning and how to find alternatives to snacks with higher carbohydrates.  He states he has decreased his ice cream and potato chip intake.  Reminded patient to avoid eating too many bananas.  He has been eating 1-2 bananas a day.  He is more mindful of snacking and is avoiding foods with higher carbohydrate intake.  Encouraged patient to increase water intake and begin to exercise as able and as recommended by PCP.  Patient continues to inject 35 units daily of Tresiba as monotherapy for his diabetes.  He injects [place] in the mornings and this has proven to work well with his schedule.  Encouraged patient to continue his great progress.  Patient states he needs to increase his activity level  Patient has been approved for [place] through [employer] patient assistance program until 01/23/19.  This will help him work to achieve target A1c~7%.  Optimization of DM therapy: o Patient reports throat swelling with Trulicity that resulted in an ED admission.  Will not start GLP1 due to this reported reaction.  Allergy lost updated in the EMR o Patient unable to continue metformin due to kidney function. o Patient unable to tolerated SGLT2 due to recurrent yeast infections o Encouraged patient to consider glipizide or meal time insulin to aid in glycemic control.  Patient does not wish to add therapy at this time and is hopeful of his diet control and exercise.  He states his  knee if better and he is able to participate in exercise now.  Will follow up next month.  Patient Self Care Activities:   Self administers medications as prescribed  Attends all scheduled provider appointments  Calls pharmacy for medication refills  Performs ADL's independently  Performs IADL's independently  Calls provider office for new concerns or questions  Please see past updates related to this goal by clicking on the "Past Updates" button in the selected goal        I would like to apply for financial assistance for Tresiba (pt-stated)       Current Barriers:   Financial Barriers  Pharmacist Clinical Goal(s):   Over the next 30 days, patient will work with PharmD to address needs related to applying for financial assistance for [name]  goal re-established on 12/24/18 for 2021 PAP process  Interventions:  Comprehensive medication review performed.  Discussed plans with patient for ongoing care management follow up and provided patient with direct contact information for care management team  Patient has been approved for free Tresiba/pen needles through [employer] patient  assistance program until 01/23/19.  Will mail patient application for [id number] patient assistance program (tresiba pen and pen needles)  Patient Self Care Activities:   Self administers medications as prescribed  Attends all scheduled provider appointments  Calls pharmacy for medication refills  Please see past updates related to this goal by clicking on the "Past Updates" button in the selected goal          Plan:   The care management team will reach out to the patient again over the next 30 days.   Provider Signature Regina Eck, PharmD, BCPS Clinical Pharmacist, [facility] [place]: [phone]

## 2018-12-30 NOTE — Patient Instructions (Addendum)
Visit Information  Goals Addressed            This Visit's Progress     Patient Stated   . "I would like to talk to you about my diabetes" (pt-stated)       Current Barriers:  [name] Knowledge Deficits related to disease process and self health management for Diabetes  Nurse Case Manager & PharmD Clinical Goal(s):  [name] Over the next 90 days, patient will demonstrate improved adherence to his prescribed treatment plan for Diabetes Management as evidenced by verbalizing understanding and adherence to monitoring CBGs, taking insulin as prescribed and improved adherence to following a Diabetic diet. 12/24/18: Goal date revised to 90 days due to delays in treatment secondary to COVID-19.   CCM RN CM Interventions:   Telephone CCM follow up completed with patient last month . Evaluation of current treatment plan related to Diabetes and patient's adherence to plan as established by provider. . Discussed patient received and reviewed the mailed diabetes printed ed mats-he denies questions . Discussed additional printed ed mats to be mailed related Meal Planning using the Plate Method    Reviewed and discussed adherence to taking prescribed insulin; pt noted he switched from taking his insulin from the pm to the am and is noticing his BS are within normal limits  Discussed plans with patient for ongoing care management follow up and provided patient with direct contact information for care management team  Scheduled a CCM telephone follow up with patient in about 2 weeks  Sent pharmacy referral to [place] [facility] to further evaluate insulin regimen-pt aware  CCM PharmD Interventions: Telephone CCM follow up completed with patient on 12/24/18 . Comprehensive medication review performed and list updated in the EMR.  No changes to current regimen . Patient is tolerating therapy well and denies adverse events. . His FBG this AM was 120.  His last A1c is 8.5% on 02/03/18 (A1c has remained 8.2% x3  this year).  He denies hypoglycemia (BG<70).  He reports that most of his FBGs range from 100-200s depending on his night time snacking habits.   . Reviewed meal planning and how to find alternatives to snacks with higher carbohydrates.  He states he has decreased his ice cream and potato chip intake.  Reminded patient to avoid eating too many bananas.  He has been eating 1-2 bananas a day.  He is more mindful of snacking and is avoiding foods with higher carbohydrate intake.  Encouraged patient to increase water intake and begin to exercise as able and as recommended by PCP. [name] Patient continues to inject 35 units daily of Tresiba as monotherapy for his diabetes.  He injects [place] in the mornings and this has proven to work well with his schedule.  Encouraged patient to continue his great progress.  Patient states he needs to increase his activity level . Patient has been approved for [place] through [employer] patient assistance program until 01/23/19.  This will help him work to achieve target A1c~7%.  Optimization of DM therapy: o Patient reports throat swelling with Trulicity that resulted in an ED admission.  Will not start GLP1 due to this reported reaction.  Allergy lost updated in the EMR o Patient unable to continue metformin due to kidney function. o Patient unable to tolerated SGLT2 due to recurrent yeast infections o Encouraged patient to consider glipizide or meal time insulin to aid in glycemic control.  Patient does not wish to add therapy at this time and is  hopeful of his diet control and exercise.  He states his knee if better and he is able to participate in exercise now.  Will follow up next month.  Patient Self Care Activities:  . Self administers medications as prescribed . Attends all scheduled provider appointments . Calls pharmacy for medication refills . Performs ADL's independently . Performs IADL's independently . Calls provider office for new concerns or  questions  Please see past updates related to this goal by clicking on the "Past Updates" button in the selected goal       . I would like to apply for financial assistance for Tresiba (pt-stated)       Current Barriers:  . Financial Barriers  Pharmacist Clinical Goal(s):  [name] Over the next 30 days, patient will work with PharmD to address needs related to applying for financial assistance for [name]  goal re-established on 12/24/18 for 2021 PAP process  Interventions: . Comprehensive medication review performed. . Discussed plans with patient for ongoing care management follow up and provided patient with direct contact information for care management team . Patient has been approved for free Tresiba/pen needles through [employer] patient assistance program until 01/23/19. . Will mail patient application for [id number] patient assistance program (tresiba pen and pen needles)  Patient Self Care Activities:  . Self administers medications as prescribed . Attends all scheduled provider appointments . Calls pharmacy for medication refills  Please see past updates related to this goal by clicking on the "Past Updates" button in the selected goal         The patient verbalized understanding of instructions provided today and declined a print copy of patient instruction materials.   The care management team will reach out to the patient again over the next 30 days.   SIGNATURE Regina Eck, PharmD, BCPS Clinical Pharmacist, [facility] [place]: [phone]

## 2019-01-02 ENCOUNTER — Other Ambulatory Visit: Payer: Self-pay | Admitting: Pharmacy Technician

## 2019-01-02 NOTE — Patient Outreach (Signed)
[facility]) Care Management  01/02/2019  Gregory Friedman 02-01-1950 [id number]                                       Medication Assistance Referral  Referral From: [facility] Embedded RPh Jenne Pane.   Medication/Company: [name]  / [employer] Patient application portion:  Education officer, museum portion: Faxed  to S. [place], PA-C Provider address/fax verified via: Office website   Follow up:  Will follow up with patient in 10-14 business days to confirm application(s) have been received.  Maud Deed Chana Bode [place] Certified Pharmacy Technician [place] Management Direct Dial:[phone]

## 2019-01-03 ENCOUNTER — Telehealth: Payer: Self-pay

## 2019-01-27 ENCOUNTER — Other Ambulatory Visit: Payer: Self-pay | Admitting: Pharmacy Technician

## 2019-01-27 NOTE — Patient Outreach (Signed)
[facility]) Care Management  01/27/2019  Mang Tailor [birth year] [id number]   Received patient portion(s) of patient assistance application(s) for [place]. Faxed completed application and required documents into [employer].  Will follow up with company(ies) in 10-14 business days to check status of application(s).  Maud Deed Chana Bode [place] Certified Pharmacy Technician [place] Management Direct [phone]

## 2019-01-29 ENCOUNTER — Ambulatory Visit: Payer: Self-pay | Admitting: Pharmacist

## 2019-01-29 NOTE — Progress Notes (Signed)
  Chronic Care Management   Outreach Note  01/29/2019 Name: Gregory Friedman MRN: [id number] DOB: [birth year]  Referred by: Shelby Mattocks, PA-C Reason for referral : Chronic Care Management   An unsuccessful telephone outreach was attempted today. The patient was referred to the case management team by for assistance with care management and care coordination.   Follow Up Plan: The care management team will reach out to the patient again over the next 7-10 business days.    SIGNATURE Regina Eck, PharmD, BCPS Clinical Pharmacist, [facility] [place]: [phone]

## 2019-02-03 ENCOUNTER — Ambulatory Visit (INDEPENDENT_AMBULATORY_CARE_PROVIDER_SITE_OTHER): Payer: Medicare HMO | Admitting: Pharmacist

## 2019-02-03 DIAGNOSIS — Z794 Long term (current) use of insulin: Secondary | ICD-10-CM

## 2019-02-03 DIAGNOSIS — E119 Type 2 diabetes mellitus without complications: Secondary | ICD-10-CM | POA: Diagnosis not present

## 2019-02-03 NOTE — Progress Notes (Signed)
Chronic Care Management    Visit Note  02/03/2019 Name: Gregory Friedman MRN: [id number] DOB: 1950/11/27  Referred by: Shelby Mattocks, PA-C Reason for referral : CCM -diabetes   Gregory Friedman is a 69 y.o. year old male who is a primary care patient of [place], Sunday [place]ill[place], [place]. The CCM team was consulted for assistance with chronic disease management and care coordination needs related to DMII  Review of patient status, including review of consultants reports, relevant laboratory and other test results, and collaboration with appropriate care team members and the patient's provider was performed as part of comprehensive patient evaluation and provision of chronic care management services.    I spoke with Gregory Friedman by telephone today.  Medications: Outpatient Encounter Medications as of 02/03/2019  Medication Sig  . Accu-Chek FastClix Lancets MISC CHECK BLOOD SUGAR ONE TIME DAILY AS DIRECTED  . antiseptic oral rinse (BIOTENE) LIQD 15 mLs by Mouth Rinse route daily as needed for dry mouth.   [name] aspirin EC 81 MG tablet Take 81 mg by mouth daily.  [name] co-enzyme Q-10 30 MG capsule Take 30 mg by mouth daily.  . enalapril (VASOTEC) 10 MG tablet TAKE 1 TABLET EVERY DAY  . glucose blood (ACCU-CHEK GUIDE) test strip 1 each by Other route 2 (two) times daily. Use as instructed  . insulin degludec (TRESIBA FLEXTOUCH) 100 UNIT/ML SOPN FlexTouch Pen Inject 40   Units subcutaneously daily at bedtime (Patient taking differently: Inject 44 Units into the skin daily. Inject 40 Units subcutaneously daily at bedtime)  . meclizine (ANTIVERT) 25 MG tablet Take 1 tablet (25 mg total) by mouth 3 (three) times daily. For dizziness  . metoprolol succinate (TOPROL-XL) 25 MG 24 hr tablet Take 2 tablets (50 mg total) by mouth daily.  . Multiple Vitamins-Minerals (MULTIVITAMIN WITH MINERALS) tablet Take 1 tablet by mouth 2 (two) times daily.   . nitroGLYCERIN (NITROSTAT) 0.4 MG SL  tablet Place 1 tablet (0.4 mg total) under the tongue every 5 (five) minutes as needed. Chest pain  . omeprazole (PRILOSEC) 40 MG capsule TAKE 1 CAPSULE EVERY DAY BEFORE A MEAL (Patient taking differently: 40 mg daily as needed. )  . [place], [place]ren[place] repens, 320 MG CAPS Take 1 capsule by mouth daily.   . simvastatin (ZOCOR) 40 MG tablet Take 1 tablet (40 mg total) by mouth every evening.   No facility-administered encounter medications on file as of 02/03/2019.     Objective:   Goals Addressed            This Visit's Progress     Patient Stated   . "I would like to talk to you about my diabetes" (pt-stated)       Current Barriers:  [name] Knowledge Deficits related to disease process and self health management for Diabetes  Nurse Case Manager & PharmD Clinical Goal(s):  [name] Over the next 90 days, patient will demonstrate improved adherence to his prescribed treatment plan for Diabetes Management as evidenced by verbalizing understanding and adherence to monitoring CBGs, taking insulin as prescribed and improved adherence to following a Diabetic diet. 12/24/18: Goal date revised to 90 days due to delays in treatment secondary to COVID-19.   CCM RN CM Interventions:   Telephone CCM follow up completed with patient last month . Evaluation of current treatment plan related to Diabetes and patient's adherence to plan as established by provider. . Discussed patient received and reviewed the mailed diabetes printed ed mats-he denies questions . Discussed additional printed ed mats to be  mailed related Meal Planning using the Plate Method    Reviewed and discussed adherence to taking prescribed insulin; pt noted he switched from taking his insulin from the pm to the am and is noticing his BS are within normal limits  Discussed plans with patient for ongoing care management follow up and provided patient with direct contact information for care management team  Scheduled a CCM telephone follow  up with patient in about 2 weeks  Sent pharmacy referral to [place] [facility] to further evaluate insulin regimen-pt aware  CCM PharmD Interventions: Telephone CCM follow up completed with patient on 02/03/2019 . Comprehensive medication review performed and list updated in the EMR.  No changes to current regimen . Patient is tolerating therapy well and denies adverse events. . His FBG this AM was 130.  FBG range 130-180.  His last A1c is 8.5% on 02/03/18 (A1c has remained 8.2% x3 this year).  He denies hypoglycemia (BG<70).  He reports that most of his FBGs range from 100-200s depending on his night time snacking habits.   . Reviewed meal planning and how to find alternatives to snacks with higher carbohydrates.  He states he has decreased his ice cream and potato chip intake.  Reminded patient to continue to avoid eating too many bananas.  He has been eating 1-2 bananas a day.  He is more mindful of snacking and is avoiding foods with higher carbohydrate intake.  Encouraged patient to increase water intake and begin to exercise as able and as recommended by PCP. [name] Patient continues to inject 40 units daily of Tresiba as monotherapy for his diabetes.  He injects [place] in the mornings and this has proven to work well with his schedule.  Encouraged patient to continue his great progress.  Patient states he needs to increase his activity level . Patient has been approved for [place] through [employer] patient assistance program until 01/23/19.  Appication sent for 2021.  This will help him work to achieve target A1c~7%.  Optimization of DM therapy: o Patient reports throat swelling with Trulicity that resulted in an ED admission.  Will not start GLP1 due to this reported reaction.  Allergy lost updated in the EMR o Patient unable to continue metformin due to kidney function. o Patient unable to tolerated SGLT2 due to recurrent yeast infections o Patient does not wish to add therapy at this time  and is hopeful of his diet control and exercise.  He states his knee if better and he is able to participate in exercise now.  Will follow up next week. o Increase Tresiba to 42 units daily (AM per patient preference)  Patient Self Care Activities:  . Self administers medications as prescribed . Attends all scheduled provider appointments . Calls pharmacy for medication refills . Performs ADL's independently . Performs IADL's independently . Calls provider office for new concerns or questions  Please see past updates related to this goal by clicking on the "Past Updates" button in the selected goal           Plan:   The care management team will reach out to the patient again over the next 7 days.   Provider Signature Regina Eck, PharmD, BCPS Clinical Pharmacist, [facility] [place]: [phone]

## 2019-02-03 NOTE — Patient Instructions (Signed)
Visit Information  Goals Addressed            This Visit's Progress     Patient Stated   . "I would like to talk to you about my diabetes" (pt-stated)       Current Barriers:  [name] Knowledge Deficits related to disease process and self health management for Diabetes  Nurse Case Manager & PharmD Clinical Goal(s):  [name] Over the next 90 days, patient will demonstrate improved adherence to his prescribed treatment plan for Diabetes Management as evidenced by verbalizing understanding and adherence to monitoring CBGs, taking insulin as prescribed and improved adherence to following a Diabetic diet. 12/24/18: Goal date revised to 90 days due to delays in treatment secondary to COVID-19.   CCM RN CM Interventions:   Telephone CCM follow up completed with patient last month . Evaluation of current treatment plan related to Diabetes and patient's adherence to plan as established by provider. . Discussed patient received and reviewed the mailed diabetes printed ed mats-he denies questions . Discussed additional printed ed mats to be mailed related Meal Planning using the Plate Method    Reviewed and discussed adherence to taking prescribed insulin; pt noted he switched from taking his insulin from the pm to the am and is noticing his BS are within normal limits  Discussed plans with patient for ongoing care management follow up and provided patient with direct contact information for care management team  Scheduled a CCM telephone follow up with patient in about 2 weeks  Sent pharmacy referral to [place] [facility] to further evaluate insulin regimen-pt aware  CCM PharmD Interventions: Telephone CCM follow up completed with patient on 02/03/2019 . Comprehensive medication review performed and list updated in the EMR.  No changes to current regimen . Patient is tolerating therapy well and denies adverse events. . His FBG this AM was 130.  FBG range 130-180.  His last A1c is 8.5% on 02/03/18 (A1c  has remained 8.2% x3 this year).  He denies hypoglycemia (BG<70).  He reports that most of his FBGs range from 100-200s depending on his night time snacking habits.   . Reviewed meal planning and how to find alternatives to snacks with higher carbohydrates.  He states he has decreased his ice cream and potato chip intake.  Reminded patient to continue to avoid eating too many bananas.  He has been eating 1-2 bananas a day.  He is more mindful of snacking and is avoiding foods with higher carbohydrate intake.  Encouraged patient to increase water intake and begin to exercise as able and as recommended by PCP. [name] Patient continues to inject 40 units daily of Tresiba as monotherapy for his diabetes.  He injects [place] in the mornings and this has proven to work well with his schedule.  Encouraged patient to continue his great progress.  Patient states he needs to increase his activity level . Patient has been approved for [place] through [employer] patient assistance program until 01/23/19.  Appication sent for 2021.  This will help him work to achieve target A1c~7%.  Optimization of DM therapy: o Patient reports throat swelling with Trulicity that resulted in an ED admission.  Will not start GLP1 due to this reported reaction.  Allergy lost updated in the EMR o Patient unable to continue metformin due to kidney function. o Patient unable to tolerated SGLT2 due to recurrent yeast infections o Patient does not wish to add therapy at this time and is hopeful of his diet  control and exercise.  He states his knee if better and he is able to participate in exercise now.  Will follow up next week. o Increase Tresiba to 42 units daily (AM per patient preference)  Patient Self Care Activities:  . Self administers medications as prescribed . Attends all scheduled provider appointments . Calls pharmacy for medication refills . Performs ADL's independently . Performs IADL's independently . Calls provider office  for new concerns or questions  Please see past updates related to this goal by clicking on the "Past Updates" button in the selected goal          The patient verbalized understanding of instructions provided today and declined a print copy of patient instruction materials.   The care management team will reach out to the patient again over the next 7 days.   SIGNATURE Regina Eck, PharmD, BCPS Clinical Pharmacist, [facility] [place]: [phone]

## 2019-02-10 ENCOUNTER — Encounter (HOSPITAL_COMMUNITY): Payer: Self-pay | Admitting: Emergency Medicine

## 2019-02-10 ENCOUNTER — Emergency Department (HOSPITAL_COMMUNITY): Payer: Medicare HMO

## 2019-02-10 ENCOUNTER — Other Ambulatory Visit: Payer: Self-pay

## 2019-02-10 ENCOUNTER — Emergency Department (HOSPITAL_COMMUNITY)
Admission: EM | Admit: 2019-02-10 | Discharge: 2019-02-10 | Disposition: A | Discharge status: Home / Self Care | Payer: Medicare HMO | Attending: Emergency Medicine | Admitting: Emergency Medicine

## 2019-02-10 DIAGNOSIS — Z7982 Long term (current) use of aspirin: Secondary | ICD-10-CM | POA: Insufficient documentation

## 2019-02-10 DIAGNOSIS — Y92019 Unspecified place in single-family (private) house as the place of occurrence of the external cause: Secondary | ICD-10-CM | POA: Insufficient documentation

## 2019-02-10 DIAGNOSIS — Y999 Unspecified external cause status: Secondary | ICD-10-CM | POA: Diagnosis not present

## 2019-02-10 DIAGNOSIS — S0291XA Unspecified fracture of skull, initial encounter for closed fracture: Secondary | ICD-10-CM | POA: Diagnosis not present

## 2019-02-10 DIAGNOSIS — R58 Hemorrhage, not elsewhere classified: Secondary | ICD-10-CM | POA: Diagnosis not present

## 2019-02-10 DIAGNOSIS — S199XXA Unspecified injury of neck, initial encounter: Secondary | ICD-10-CM | POA: Diagnosis not present

## 2019-02-10 DIAGNOSIS — R0902 Hypoxemia: Secondary | ICD-10-CM | POA: Diagnosis not present

## 2019-02-10 DIAGNOSIS — I10 Essential (primary) hypertension: Secondary | ICD-10-CM | POA: Insufficient documentation

## 2019-02-10 DIAGNOSIS — Z79899 Other long term (current) drug therapy: Secondary | ICD-10-CM | POA: Insufficient documentation

## 2019-02-10 DIAGNOSIS — S60512A Abrasion of left hand, initial encounter: Secondary | ICD-10-CM | POA: Insufficient documentation

## 2019-02-10 DIAGNOSIS — R000 Tachycardia, unspecified: Secondary | ICD-10-CM | POA: Diagnosis not present

## 2019-02-10 DIAGNOSIS — R52 Pain, unspecified: Secondary | ICD-10-CM | POA: Diagnosis not present

## 2019-02-10 DIAGNOSIS — I252 Old myocardial infarction: Secondary | ICD-10-CM | POA: Diagnosis not present

## 2019-02-10 DIAGNOSIS — W109XXA Fall (on) (from) unspecified stairs and steps, initial encounter: Secondary | ICD-10-CM | POA: Insufficient documentation

## 2019-02-10 DIAGNOSIS — S0181XA Laceration without foreign body of other part of head, initial encounter: Secondary | ICD-10-CM | POA: Diagnosis not present

## 2019-02-10 DIAGNOSIS — S61512A Laceration without foreign body of left wrist, initial encounter: Secondary | ICD-10-CM | POA: Diagnosis not present

## 2019-02-10 DIAGNOSIS — S0990XA Unspecified injury of head, initial encounter: Secondary | ICD-10-CM | POA: Insufficient documentation

## 2019-02-10 DIAGNOSIS — Y9389 Activity, other specified: Secondary | ICD-10-CM | POA: Diagnosis not present

## 2019-02-10 DIAGNOSIS — S0101XA Laceration without foreign body of scalp, initial encounter: Secondary | ICD-10-CM | POA: Diagnosis not present

## 2019-02-10 DIAGNOSIS — S61412A Laceration without foreign body of left hand, initial encounter: Secondary | ICD-10-CM | POA: Diagnosis not present

## 2019-02-10 LAB — CBC WITH DIFFERENTIAL/PLATELET
Abs Immature Granulocytes: 0.03 10*3/uL (ref 0.00–0.07)
Basophils Absolute: 0.1 10*3/uL (ref 0.0–0.1)
Basophils Relative: 1 %
Eosinophils Absolute: 0.3 10*3/uL (ref 0.0–0.5)
Eosinophils Relative: 4 %
HCT: 41.8 % (ref 39.0–52.0)
Hemoglobin: 13.7 g/dL (ref 13.0–17.0)
Immature Granulocytes: 0 %
Lymphocytes Relative: 14 %
Lymphs Abs: 1.1 10*3/uL (ref 0.7–4.0)
MCH: 27.8 pg (ref 26.0–34.0)
MCHC: 32.8 g/dL (ref 30.0–36.0)
MCV: 84.8 fL (ref 80.0–100.0)
Monocytes Absolute: 0.4 10*3/uL (ref 0.1–1.0)
Monocytes Relative: 6 %
Neutro Abs: 5.8 10*3/uL (ref 1.7–7.7)
Neutrophils Relative %: 75 %
Platelets: 172 10*3/uL (ref 150–400)
RBC: 4.93 MIL/uL (ref 4.22–5.81)
RDW: 12.6 % (ref 11.5–15.5)
WBC: 7.7 10*3/uL (ref 4.0–10.5)
nRBC: 0 % (ref 0.0–0.2)

## 2019-02-10 LAB — BASIC METABOLIC PANEL
Anion gap: 9 (ref 5–15)
BUN: 13 mg/dL (ref 8–23)
CO2: 27 mmol/L (ref 22–32)
Calcium: 9 mg/dL (ref 8.9–10.3)
Chloride: 100 mmol/L (ref 98–111)
Creatinine, Ser: 1.52 mg/dL — ABNORMAL HIGH (ref 0.61–1.24)
GFR calc Af Amer: 53 mL/min — ABNORMAL LOW (ref >60)
GFR calc non Af Amer: 46 mL/min — ABNORMAL LOW (ref >60)
Glucose, Bld: 237 mg/dL — ABNORMAL HIGH (ref 70–99)
Potassium: 4.5 mmol/L (ref 3.5–5.1)
Sodium: 136 mmol/L (ref 135–145)

## 2019-02-10 MED ORDER — TETANUS-DIPHTH-ACELL PERTUSSIS 5-2.5-18.5 LF-MCG/0.5 IM SUSP
0.5000 mL | Freq: Once | INTRAMUSCULAR | Status: DC
  Filled 2019-02-10: qty 0.5

## 2019-02-10 MED ORDER — LIDOCAINE HCL (PF) 1 % IJ SOLN
INTRAMUSCULAR | Status: AC
  Filled 2019-02-10: qty 5

## 2019-02-10 MED ORDER — LIDOCAINE HCL (PF) 1 % IJ SOLN
10.0000 mL | Freq: Once | INTRAMUSCULAR | Status: AC
  Administered 2019-02-10: 10 mL via INTRADERMAL
  Filled 2019-02-10: qty 10

## 2019-02-10 NOTE — ED Provider Notes (Signed)
I saw and evaluated the patient, reviewed the resident's note and I agree with the findings and plan.  EKG: EKG Interpretation  Date/Time:  Monday February 10 2019 16:27:14 EST Ventricular Rate:  54 PR Interval:    QRS Duration: 98 QT Interval:  436 QTC Calculation: 414 R Axis:   -4 Text Interpretation: Sinus rhythm Anteroseptal infarct, old No significant change since last tracing Confirmed by Lacretia Leigh [id number]) on 02/10/2019 5:19:72 PM   69 year old male here after mechanical fall just prior to arrival.  Sustained laceration to his forehead.  On exam he has no focal neurological deficits.  Will check CT of head and C-spine.  Laceration to be repaired.    Lacretia Leigh, MD 02/10/19 1730

## 2019-02-10 NOTE — ED Provider Notes (Addendum)
[facility] EMERGENCY DEPARTMENT Provider Note   CSN: [id number] Arrival date & time: 02/10/19  1620     History Chief Complaint  Patient presents with   Fall   Laceration    Gregory Friedman is a 69 y.o. male.  HPI 69 year old male with history of hypertension presenting to the emergency department status post Q mechanical fall.  Patient was hauling groceries into his house when he tripped and fell onto the second step going into his house.  No loss of consciousness, sustained a laceration to the right forehead.  Called EMS.  Patient currently denies any chest pain or shortness of breath, no other traumatic injuries, patient does report some minor abrasions to the left knuckles.  Denies any visual symptoms, or headache, does report some mild pain when the laceration is out.  Currently denies any neck pain, no neurological symptoms in his extremities, no abdominal pain or nausea or vomiting.    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 since 2003   Limited joint range of motion    limited mouth opening and neck ROM s/p radical neck dissection   Myocardial infarction ([place]) 2003   Non-insulin dependent type 2 diabetes mellitus (HCC)    PONV (postoperative nausea and vomiting)    S/P radiation therapy    Received 8 fractions, then stopped due to mucositis with Radiation/ Cisplatin   Status post dilation of esophageal narrowing    Trigger thumb of right hand 01/2016    Patient Active Problem List   Diagnosis Date Noted   Skin lesion 10/31/2018   Diabetes mellitus  type 2, insulin dependent ([place]) 09/12/2018   Elevated serum creatinine 09/12/2018   Hypothyroidism 10/10/2017   Atherosclerosis of aorta ([place]) 10/10/2017   Unspecified skin changes 10/10/2017   Other long term (current) drug therapy 10/10/2017   Abdominal pain 09/21/2017   Nausea & vomiting 09/21/2017   History of head and neck cancer 09/21/2017   History of colonic polyps 04/04/2016   Dysphagia 04/04/2016   Bloating 04/04/2016   Diarrhea 04/04/2016   Trigger finger of right thumb 08/23/2015   Chest pain 07/15/2015   Pain in the chest    S/P CABG (coronary artery bypass graft)    Noncompliance with medications 07/08/2015   Acute gout 04/19/2015   GERD (gastroesophageal reflux disease) 05/14/2014   Depression, major, in remission ([place]) 03/27/2013   Allergic rhinitis, cause unspecified 03/27/2013   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   Depressive disorder, not elsewhere classified 12/06/2011   Nonspecific (abnormal) findings on radiological and other examination of gastrointestinal tract 02/10/2011   Dysphagia, unspecified(787.20) 02/10/2011   Palpitations 02/02/2011   Dizziness 07/15/2010   DIAB W/OPHTH MANIFESTS TYPE I [JUV TYPE] UNCNTRL 08/02/2009   Mixed hyperlipidemia 08/02/2009   Essential hypertension 08/02/2009   CAD (coronary artery disease) 08/02/2009    Past Surgical History:  Procedure Laterality Date   CARDIAC CATHETERIZATION  05/25/2010   CARDIOVASCULAR STRESS TEST  07/2009   COLONOSCOPY WITH PROPOFOL  02/16/2011   CORONARY ANGIOPLASTY  2003; 2006   2 stents in heart   CORONARY ARTERY BYPASS GRAFT  2005   CYSTOSCOPY  02/09/2011   Procedure: CYSTOSCOPY;  Surgeon: Marissa Nestle, MD;  Location: [facility];  Service: Urology;  Laterality: N/A;   ESOPHAGOGASTRODUODENOSCOPY (EGD) WITH ESOPHAGEAL DILATION  02/16/2011   with Propofol   FOOT ARTHRODESIS, SUBTALAR Right 10/13/2009   MASS EXCISION N/A 03/13/2012     Procedure: EXCISION LEFT BUCCAL MUCOSA;  Surgeon: Rozetta Nunnery, MD;  Location: [place];  Service: ENT;  Laterality: N/A;   RADICAL NECK DISSECTION Left 03/13/2012   Procedure: RADICAL NECK DISSECTION;  Surgeon: Rozetta Nunnery, MD;  Location: [place];  Service: ENT;  Laterality: Left;   TOOTH EXTRACTION N/A 03/13/2012   Procedure: EXTRACTION MOLARS;  Surgeon: Rozetta Nunnery, MD;  Location: [place];  Service: ENT;  Laterality: N/A;   TRIGGER FINGER RELEASE Right 02/24/2016   Procedure: RELEASE TRIGGER FINGER/A-1 PULLEY RIGHT THUMB;  Surgeon: Daryll Brod, MD;  Location: [place];  Service: Orthopedics;  Laterality: Right;  FAB       Family History  Problem Relation Age of Onset   Stroke Sister    Diabetes Brother    Seizures Mother    Asthma Father    Thyroid cancer Daughter    Diabetes Sister    Parkinson's disease Brother    Heart disease Brother    Diabetes Brother    Colon cancer Neg Hx     Social History   Tobacco Use   Smoking status: Never Smoker   Smokeless tobacco: Never Used  Substance Use Topics   Alcohol use: No   Drug use: No    Home Medications Prior to Admission medications   Medication Sig Start Date End Date Taking? Authorizing Provider  aspirin EC 81 MG tablet Take 81 mg by mouth at bedtime.    Yes [provider]  Coenzyme Q10 200 MG capsule Take 200 mg by mouth at bedtime.    Yes [provider]  enalapril (VASOTEC) 10 MG tablet TAKE 1 TABLET EVERY DAY Patient taking differently: Take 10 mg by mouth at bedtime.  12/18/18  Yes Rodriguez-Southworth, Sunday Spillers, PA-C  insulin degludec (TRESIBA FLEXTOUCH) 100 UNIT/ML SOPN FlexTouch Pen Inject 40   Units subcutaneously daily at bedtime Patient taking differently: Inject 45 Units into the skin daily.  10/10/18  Yes Rodriguez-Southworth, Sunday Spillers, PA-C  metoprolol succinate (TOPROL-XL) 25 MG 24 hr tablet Take 2 tablets (50 mg total) by mouth daily. Patient  taking differently: Take 50 mg by mouth at bedtime.  10/10/18  Yes Fay Records, MD  nitroGLYCERIN (NITROSTAT) 0.4 MG SL tablet Place 1 tablet (0.4 mg total) under the tongue every 5 (five) minutes as needed. Chest pain Patient taking differently: Place 0.4 mg under the tongue every 5 (five) minutes as needed for chest pain.  06/16/16  Yes Fay Records, MD  omeprazole (PRILOSEC) 40 MG capsule TAKE 1 CAPSULE EVERY DAY BEFORE A MEAL Patient taking differently: Take 40 mg by mouth at bedtime.  12/05/17  Yes [name], MD  Saw Palmetto, Serenoa repens, 320 MG CAPS Take 320 mg by mouth at bedtime.    Yes [provider]  Accu-Chek FastClix Lancets MISC CHECK BLOOD SUGAR ONE TIME DAILY AS DIRECTED 04/25/18   Rodriguez-Southworth, Sunday Spillers, PA-C  glucose blood (ACCU-CHEK GUIDE) test strip 1 each by Other route 2 (two) times daily. Use as instructed 03/25/18   Rodriguez-Southworth, Sunday Spillers,  PA-C  meclizine (ANTIVERT) 25 MG tablet Take 1 tablet (25 mg total) by mouth 3 (three) times daily. For dizziness Patient not taking: Reported on 02/10/2019 10/10/18   Rodriguez-Southworth, Sunday Spillers, PA-C  simvastatin (ZOCOR) 40 MG tablet Take 1 tablet (40 mg total) by mouth every evening. Patient taking differently: Take 40 mg by mouth at bedtime.  08/05/18   Rodriguez-Southworth, Sunday Spillers, PA-C    Allergies    Patient has no known allergies.  Review of Systems   Review of Systems  Constitutional: Negative for chills, fatigue and fever.  HENT: Negative for ear pain and sore throat.   Eyes: Negative for pain and visual disturbance.  Respiratory: Negative for cough and shortness of breath.   Cardiovascular: Negative for chest pain and palpitations.  Gastrointestinal: Negative for abdominal pain and vomiting.  Genitourinary: Negative for dysuria and hematuria.  Musculoskeletal: Positive for arthralgias and myalgias. Negative for back pain.  Skin: Positive for color change and wound. Negative for rash.    Neurological: Positive for headaches. Negative for seizures and syncope.  Psychiatric/Behavioral: Negative for agitation, behavioral problems, confusion and decreased concentration.  All other systems reviewed and are negative.   Physical Exam Updated Vital Signs BP (!) 161/84    Pulse (!) 50    Temp 98 F (36.7 C) (Oral)    Resp 15    SpO2 98%   Physical Exam Vitals and nursing note reviewed.  Constitutional:      General: He is not in acute distress.    Appearance: He is well-developed. He is not toxic-appearing or diaphoretic.  HENT:     Head: Normocephalic.     Comments: Large gaping 4cm laceration to right forehead, small amount of bleeding present    Nose: Nose normal. No congestion.     Mouth/Throat:     Mouth: Mucous membranes are moist.     Pharynx: Oropharynx is clear.  Eyes:     Conjunctiva/sclera: Conjunctivae normal.  Cardiovascular:     Rate and Rhythm: Normal rate and regular rhythm.     Heart sounds: No murmur.  Pulmonary:     Effort: Pulmonary effort is normal. No respiratory distress.     Breath sounds: Normal breath sounds.  Abdominal:     General: Abdomen is flat.     Palpations: Abdomen is soft.     Tenderness: There is no abdominal tenderness. There is no guarding or rebound.     Hernia: No hernia is present.  Musculoskeletal:        General: Tenderness present.     Cervical back: Neck supple. No rigidity or tenderness.     Comments: Left fingers abrasion 1-4, mildly TTP NVI hand/wrist, no wrist TTP   Skin:    General: Skin is warm and dry.     Capillary Refill: Capillary refill takes less than 2 seconds.  Neurological:     General: No focal deficit present.     Mental Status: He is alert and oriented to person, place, and time.  Psychiatric:        Mood and Affect: Mood normal.        Behavior: Behavior normal.     ED Results / Procedures / Treatments   Labs (all labs ordered are listed, but only abnormal results are displayed) Labs  Reviewed  BASIC METABOLIC PANEL - Abnormal; Notable for the following components:      Result Value   Glucose, Bld 237 (*)    Creatinine, Ser 1.52 (*)    GFR  calc non Af Amer 46 (*)    GFR calc Af Amer 53 (*)    All other components within normal limits  CBC WITH DIFFERENTIAL/PLATELET    EKG EKG Interpretation  Date/Time:  Monday February 10 2019 16:27:14 EST Ventricular Rate:  54 PR Interval:    QRS Duration: 98 QT Interval:  436 QTC Calculation: 414 R Axis:   -4 Text Interpretation: Sinus rhythm Anteroseptal infarct, old No significant change since last tracing Confirmed by Lacretia Leigh ([id number]) on 02/10/2019 5:30:25 PM   Radiology DG Wrist Complete Left  Result Date: 02/10/2019 CLINICAL DATA:  Left hand and wrist lacerations following a fall. EXAM: LEFT WRIST - COMPLETE 3+ VIEW COMPARISON:  Left hand radiographs obtained at the same time. FINDINGS: Soft tissue swelling in the ulnar aspect of the left hand. No fracture, dislocation or radiopaque foreign body. IMPRESSION: No fracture or radiopaque foreign body. Electronically Signed   By: Claudie Revering M.D.   On: 02/10/2019 18:23   CT Head Wo Contrast  Result Date: 02/10/2019 CLINICAL DATA:  Status post fall. EXAM: CT HEAD WITHOUT CONTRAST CT CERVICAL SPINE WITHOUT CONTRAST TECHNIQUE: Multidetector CT imaging of the head and cervical spine was performed following the standard protocol without intravenous contrast. Multiplanar CT image reconstructions of the cervical spine were also generated. COMPARISON:  None. FINDINGS: CT HEAD FINDINGS Brain: No evidence of acute infarction, hemorrhage, hydrocephalus, extra-axial collection or mass lesion/mass effect. Vascular: No hyperdense vessel or unexpected calcification. Skull: Normal. Negative for fracture or focal lesion. Sinuses/Orbits: No acute finding. Other: A right frontal scalp soft tissue defect is noted. CT CERVICAL SPINE FINDINGS Alignment: Normal. Skull base and vertebrae: No acute  fracture. No primary bone lesion or focal pathologic process. Soft tissues and spinal canal: No prevertebral fluid or swelling. No visible canal hematoma. Disc levels: C2-3: Normal endplates. Normal disc height and morphology. Normal bilateral uncovertebral and apophyseal joints. Normal central canal and intervertebral neuroforamina. C3-4: Normal endplates. Normal disc height and morphology. Normal bilateral uncovertebral and apophyseal joints. Normal central canal and intervertebral neuroforamina. C4-5: Normal endplates. Normal disc height and morphology. Normal bilateral uncovertebral and apophyseal joints. Normal central canal and intervertebral neuroforamina. C5-6: Normal endplates. Normal disc height and morphology. Normal bilateral uncovertebral and apophyseal joints. Normal central canal and intervertebral neuroforamina. C6-7: Normal endplates. Normal disc height and morphology. Normal bilateral uncovertebral and apophyseal joints. Normal central canal and intervertebral neuroforamina. C7-T1: Normal endplates. Normal disc height and morphology. Normal bilateral uncovertebral and apophyseal joints. Normal central canal and intervertebral neuroforamina. Normal visualized soft tissue structures. Upper chest: Negative. Other: None. IMPRESSION: 1. Right frontal scalp soft tissue defect without evidence of acute fracture or acute intracranial abnormality. 2. No acute cervical spine fracture. Electronically Signed   By: Virgina Norfolk M.D.   On: 02/10/2019 17:34   CT Cervical Spine Wo Contrast  Result Date: 02/10/2019 CLINICAL DATA:  Status post fall. EXAM: CT HEAD WITHOUT CONTRAST CT CERVICAL SPINE WITHOUT CONTRAST TECHNIQUE: Multidetector CT imaging of the head and cervical spine was performed following the standard protocol without intravenous contrast. Multiplanar CT image reconstructions of the cervical spine were also generated. COMPARISON:  None. FINDINGS: CT HEAD FINDINGS Brain: No evidence of acute  infarction, hemorrhage, hydrocephalus, extra-axial collection or mass lesion/mass effect. Vascular: No hyperdense vessel or unexpected calcification. Skull: Normal. Negative for fracture or focal lesion. Sinuses/Orbits: No acute finding. Other: A right frontal scalp soft tissue defect is noted. CT CERVICAL SPINE FINDINGS Alignment: Normal. Skull base and vertebrae: No acute fracture.  No primary bone lesion or focal pathologic process. Soft tissues and spinal canal: No prevertebral fluid or swelling. No visible canal hematoma. Disc levels: C2-3: Normal endplates. Normal disc height and morphology. Normal bilateral uncovertebral and apophyseal joints. Normal central canal and intervertebral neuroforamina. C3-4: Normal endplates. Normal disc height and morphology. Normal bilateral uncovertebral and apophyseal joints. Normal central canal and intervertebral neuroforamina. C4-5: Normal endplates. Normal disc height and morphology. Normal bilateral uncovertebral and apophyseal joints. Normal central canal and intervertebral neuroforamina. C5-6: Normal endplates. Normal disc height and morphology. Normal bilateral uncovertebral and apophyseal joints. Normal central canal and intervertebral neuroforamina. C6-7: Normal endplates. Normal disc height and morphology. Normal bilateral uncovertebral and apophyseal joints. Normal central canal and intervertebral neuroforamina. C7-T1: Normal endplates. Normal disc height and morphology. Normal bilateral uncovertebral and apophyseal joints. Normal central canal and intervertebral neuroforamina. Normal visualized soft tissue structures. Upper chest: Negative. Other: None. IMPRESSION: 1. Right frontal scalp soft tissue defect without evidence of acute fracture or acute intracranial abnormality. 2. No acute cervical spine fracture. Electronically Signed   By: Virgina Norfolk M.D.   On: 02/10/2019 17:34   DG Hand Complete Left  Result Date: 02/10/2019 CLINICAL DATA:  Left hand and  wrist lacerations following a fall. EXAM: LEFT HAND - COMPLETE 3+ VIEW COMPARISON:  Left wrist radiographs obtained at the same time. FINDINGS: Soft tissue swelling in the ulnar aspect of the hand. No fracture, dislocation or radiopaque foreign body. No soft tissue air or gas. IMPRESSION: Soft tissue swelling without fracture or radiopaque foreign body. Electronically Signed   By: Claudie Revering M.D.   On: 02/10/2019 18:24    Procedures .Marland KitchenLaceration Repair  Date/Time: 02/10/2019 7:00 PM Performed by: Kizzie Fantasia, MD Authorized by: Lacretia Leigh, MD   Consent:    Consent obtained:  Verbal   Consent given by:  Patient   Risks discussed:  Pain, retained foreign body, need for additional repair, nerve damage, poor cosmetic result, tendon damage, vascular damage and poor wound healing   Alternatives discussed:  No treatment and delayed treatment Anesthesia (see MAR for exact dosages):    Anesthesia method:  Local infiltration   Local anesthetic:  Lidocaine 1% WITH epi Laceration details:    Location:  Scalp   Scalp location:  Frontal   Length (cm):  4   Depth (mm):  5 Repair type:    Repair type:  Complex Pre-procedure details:    Preparation:  Patient was prepped and draped in usual sterile fashion and imaging obtained to evaluate for foreign bodies Exploration:    Limited defect created (wound extended): no     Hemostasis achieved with:  Tied off vessels and direct pressure   Wound exploration: wound explored through full range of motion and entire depth of wound probed and visualized     Contaminated: no   Treatment:    Area cleansed with:  Saline   Amount of cleaning:  Extensive   Irrigation solution:  Sterile saline   Irrigation method:  Pressure wash   Visualized foreign bodies/material removed: no     Debridement:  Minimal   Undermining:  Minimal   Scar revision: no   Subcutaneous repair:    Suture size:  5-0   Suture material:  Fast-absorbing gut   Suture technique:   Figure eight and simple interrupted   Number of sutures:  3 Skin repair:    Repair method:  Sutures   Suture size:  4-0   Suture material:  Prolene   Suture  technique:  Simple interrupted   Number of sutures:  6 Approximation:    Approximation:  Close Post-procedure details:    Dressing:  Antibiotic ointment, non-adherent dressing and adhesive bandage   Patient tolerance of procedure:  Tolerated well, no immediate complications   (including critical care time)  Medications Ordered in ED Medications  Tdap (BOOSTRIX) injection 0.5 mL (0.5 mLs Intramuscular Refused 02/10/19 1822)  lidocaine (PF) (XYLOCAINE) 1 % injection 10 mL (10 mLs Intradermal Given 02/10/19 1803)  lidocaine (PF) (XYLOCAINE) 1 % injection (  Given 02/10/19 1747)    ED Course  I have reviewed the triage vital signs and the nursing notes.  Pertinent labs & imaging results that were available during my care of the patient were reviewed by me and considered in my medical decision making (see chart for details).    MDM Rules/Calculators/A&P                      69 year old male with history stated above presenting to the ED status post fall.  Patient sustained a large laceration of the forehead.  See procedure note.  Closed bedside, tolerated well.  Patient CT scans of his head and neck were both reassuring, patient ambulated well at his baseline.  X-rays of the left wrist and hand are also reassuring on my exam and review.  Labs are reassuring as well, no other signs of traumatic injury on my exam to the chest or abdomen, no need for complete trauma scans at this time.  Patient was offered Tdap, however he refused.  Risks and benefits were explained to the patient.  He still did not want the shot, he has capacity and is GCS 15.  Patient okay for discharge at this time.  Return precautions were given, patient was given instructions for wound care as well as he will need wound check in the next 5 to 7 days at his PCPs office.   He agreed and understood plan, discharged home in good condition. C spine cleared.   The attending physician was present and available for all medical decision making and procedures related to this patient's care.  Final Clinical Impression(s) / ED Diagnoses Final diagnoses:  Injury of head, initial encounter    Rx / DC Orders ED Discharge Orders    None        Kizzie Fantasia, MD 02/10/19 Pauline Aus    Lacretia Leigh, MD 02/11/19 1952

## 2019-02-10 NOTE — ED Triage Notes (Signed)
Pt BIB [facility] from home. Pt fell at his home this afternoon. Pt fell forward on his front steps. Pt hit forehead on one step and sustained laceration to that area. Pt denies loss of consciousness. VSS. NAD.

## 2019-02-10 NOTE — Discharge Instructions (Addendum)
Please see your PCP for wound care/check and suture removal in 7-10 days. Please return to ED for worsening wound drainage, bleeding, or pain. Your CT head and neck were reassuring. Tylenol and motrin for pain as needed.

## 2019-02-10 NOTE — ED Notes (Signed)
Suture cart at bedside 

## 2019-02-17 ENCOUNTER — Ambulatory Visit (INDEPENDENT_AMBULATORY_CARE_PROVIDER_SITE_OTHER): Payer: Medicare HMO | Admitting: Internal Medicine

## 2019-02-17 ENCOUNTER — Encounter: Payer: Self-pay | Admitting: Internal Medicine

## 2019-02-17 ENCOUNTER — Other Ambulatory Visit: Payer: Self-pay

## 2019-02-17 VITALS — BP 128/96 | HR 57 | Temp 98.1°F | Ht 67.4 in | Wt 195.4 lb

## 2019-02-17 DIAGNOSIS — S0181XD Laceration without foreign body of other part of head, subsequent encounter: Secondary | ICD-10-CM | POA: Diagnosis not present

## 2019-02-17 DIAGNOSIS — Z4802 Encounter for removal of sutures: Secondary | ICD-10-CM

## 2019-02-17 NOTE — Patient Instructions (Signed)
Please call the office [phone] if you have drainage from the wound  Laceration Care, Adult A laceration is a cut that may go through all layers of the skin. The cut may also go into the tissue that is right under the skin. Some cuts heal on their own. Others need to be closed with stitches (sutures), staples, skin adhesive strips, or skin glue. Taking care of your injury lowers your risk of infection, helps your injury to heal better, and may prevent scarring. Supplies needed:  Soap.  Water.  Hand sanitizer.  Bandage (dressing).  Antibiotic ointment.  Clean towel. How to take care of your cut Wash your hands with soap and water before touching your wound or changing your bandage. If soap and water are not available, use hand sanitizer. If your doctor used stitches or staples:  Keep the wound clean and dry.  If you were given a bandage, change it at least once a day as told by your doctor. You should also change it if it gets wet or dirty.  Keep the wound completely dry for the first 24 hours, or as told by your doctor. After that, you may take a shower or a bath. Do not get the wound soaked in water until after the stitches or staples have been removed.  Clean the wound once a day, or as told by your doctor: ? Wash the wound with soap and water. ? Rinse the wound with water to remove all soap. ? Pat the wound dry with a clean towel. Do not rub the wound.  After you clean the wound, put a thin layer of antibiotic ointment on it as told by your doctor. This ointment: ? Helps to prevent infection. ? Keeps the bandage from sticking to the wound.  Have your stitches or staples removed as told by your doctor. If your doctor used skin adhesive strips:  Keep the wound clean and dry.  If you were given a bandage, you should change it at least once a day as told by your doctor. You should also change it if it gets wet or dirty.  Do not get the skin adhesive strips wet. You can  take a shower or a bath, but keep the wound dry.  If the wound gets wet, pat it dry with a clean towel. Do not rub the wound.  Skin adhesive strips fall off on their own. You can trim the strips as the wound heals. Do not remove any strips that are still stuck to the wound. They will fall off after a while. If your doctor used skin glue:  Try to keep your wound dry, but you may briefly wet it in the shower or bath. Do not soak the wound in water, such as by swimming.  After you take a shower or a bath, gently pat the wound dry with a clean towel. Do not rub the wound.  Do not do any activities that will make you really sweaty until the skin glue has fallen off on its own.  Do not apply liquid, cream, or ointment medicine to your wound while the skin glue is still on.  If you were given a bandage, you should change it at least once a day or as told by your doctor. You should also change it if it gets dirty or wet.  If a bandage is placed over the wound, do not let the tape touch the skin glue.  Do not pick at the glue. The skin glue  usually stays on for 5-10 days. Then, it falls off the skin. General instructions   Take over-the-counter and prescription medicines only as told by your doctor.  If you were given antibiotic medicine or ointment, take or apply it as told by your doctor. Do not stop using it even if your condition improves.  Do not scratch or pick at the wound.  Check your wound every day for signs of infection. Watch for: ? Redness, swelling, or pain. ? Fluid, blood, or pus.  Raise (elevate) the injured area above the level of your heart while you are sitting or lying down.  If directed, put ice on the affected area: ? Put ice in a plastic bag. ? Place a towel between your skin and the bag. ? Leave the ice on for 20 minutes, 2-3 times a day.  Prevent scarring by covering your wound with sunscreen of at least 30 SPF whenever you are outside after your wound has  healed.  Keep all follow-up visits as told by your doctor. This is important. Get help if:  You got a tetanus shot and you have any of these problems at the injection site: ? Swelling. ? Very bad pain. ? Redness. ? Bleeding.  You have a fever.  A wound that was closed breaks open.  You notice a bad smell coming from your wound or your bandage.  You notice something coming out of the wound, such as wood or glass.  Medicine does not relieve your pain.  You have more redness, swelling, or pain at the site of your wound.  You have fluid, blood, or pus coming from your wound.  You notice a change in the color of your skin near your wound.  You need to change the bandage often because fluid, blood, or pus is coming from the wound.  You start to have a new rash.  You start to have numbness around the wound. Get help right away if:  You have very bad swelling around the wound.  Your pain suddenly gets worse and is very bad.  You notice painful lumps near the wound or anywhere on your body.  You have a red streak going away from your wound.  The wound is on your hand or foot, and: ? You cannot move a finger or toe. ? Your fingers or toes look pale or bluish. Summary  A laceration is a cut that may go through all layers of the skin. The cut may also go into the tissue right under the skin.  Some cuts heal on their own. Others need to be closed with stitches, staples, skin adhesive strips, or skin glue.  Follow your doctor's instructions for caring for your cut. Proper care of a cut lowers the risk of infection, helps the cut heal better, and prevents scarring. This information is not intended to replace advice given to you by your health care provider. Make sure you discuss any questions you have with your health care provider. Document Revised: 03/09/2017 Document Reviewed: 01/29/2017 Elsevier Patient Education  [place].

## 2019-02-18 ENCOUNTER — Telehealth: Payer: Self-pay

## 2019-02-18 NOTE — Telephone Encounter (Signed)
I spoke to the pt's wife Bonnita Nasuti and was told that the pt is doing good and he has no pain.

## 2019-02-18 NOTE — Telephone Encounter (Signed)
Called to check on pt but no answer and unable to leave message

## 2019-02-23 IMAGING — CT CT ABD-PELV W/ CM
2 of 5 series · 16 of 46 positions shown, 18 images · IV contrast (APPLIED)
Comparison: 08/31/2017

CLINICAL DATA: Generalized abdominal pain

EXAM:
CT ABDOMEN AND PELVIS WITH CONTRAST
TECHNIQUE: Multidetector CT imaging of the abdomen and pelvis was performed
using the standard protocol following bolus administration of
intravenous contrast.
CONTRAST:  100mL DGAN9D-I55 IOPAMIDOL (DGAN9D-I55) INJECTION 61%

[Series 3: abd/ pelvis 5.0 i30f 2 · axial · 0.87mm/px · z∈[+920,+1395]mm · 13 of 107 slices shown, 15 images]
[im 6/107  soft-tissue]
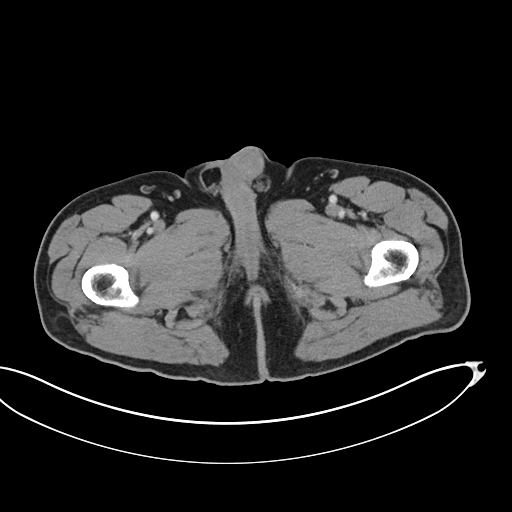
[im 6/107  bone]
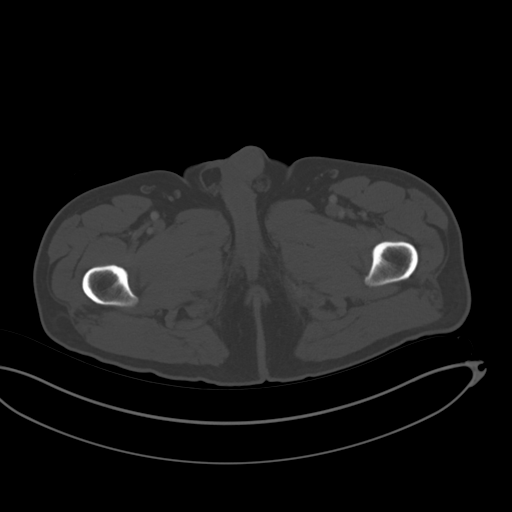
[im 16/107  soft-tissue]
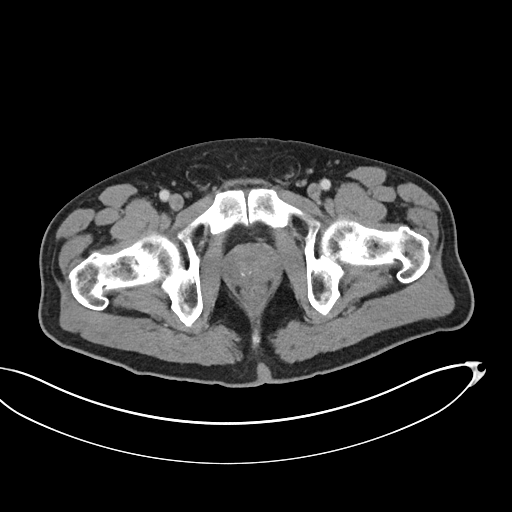
[im 22/107  soft-tissue]
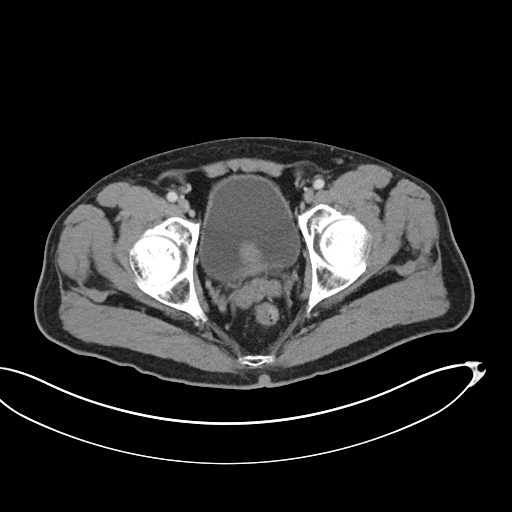
[im 32/107  soft-tissue]
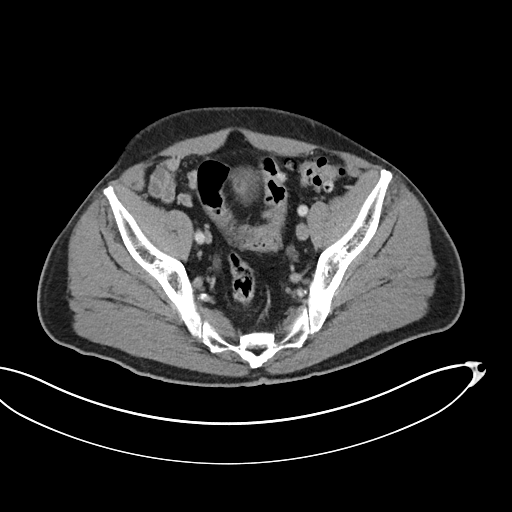
[im 38/107  soft-tissue]
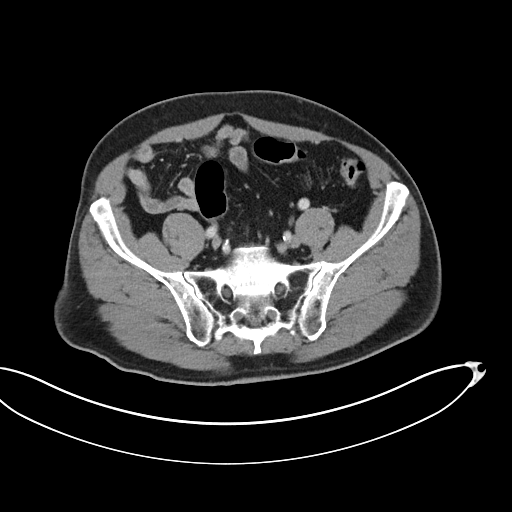
[im 48/107  soft-tissue]
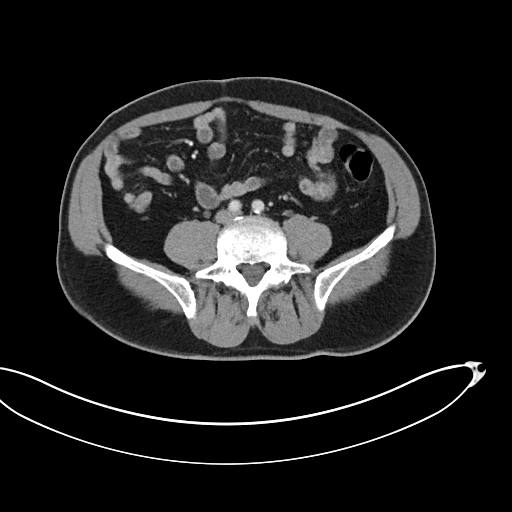
[im 54/107  soft-tissue]
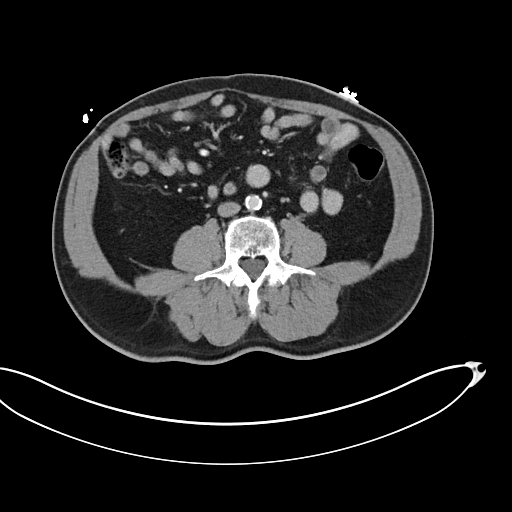
[im 59/107  soft-tissue]
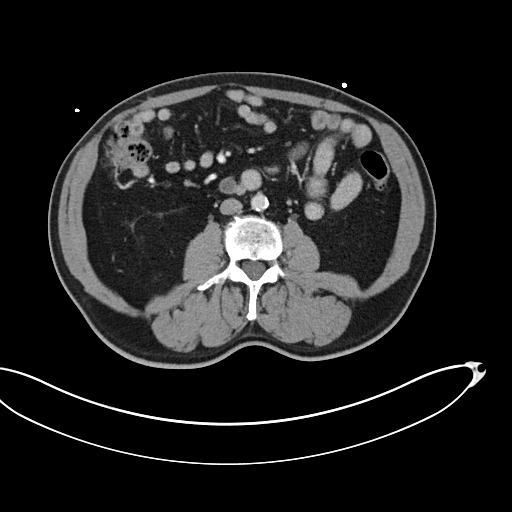
[im 69/107  soft-tissue]
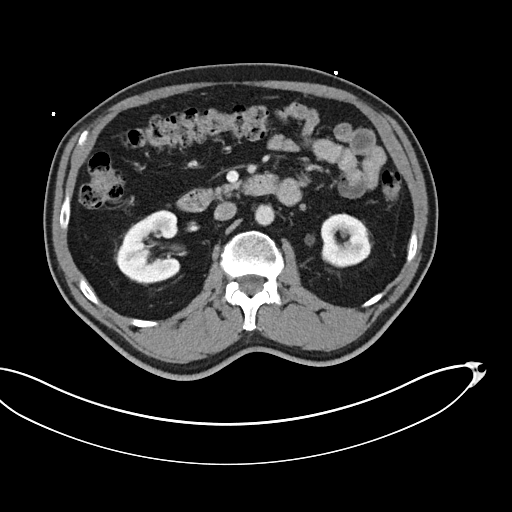
[im 69/107  bone]
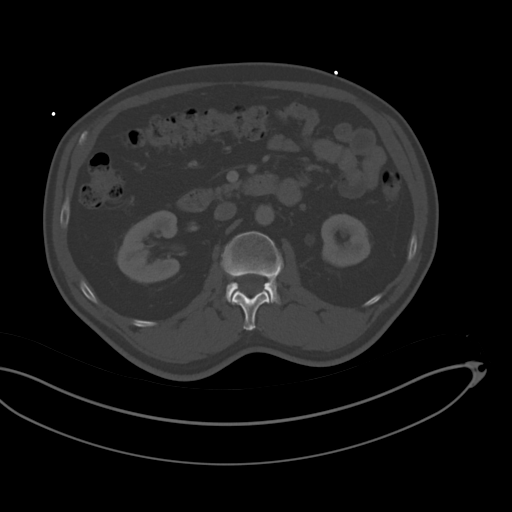
[im 75/107  soft-tissue]
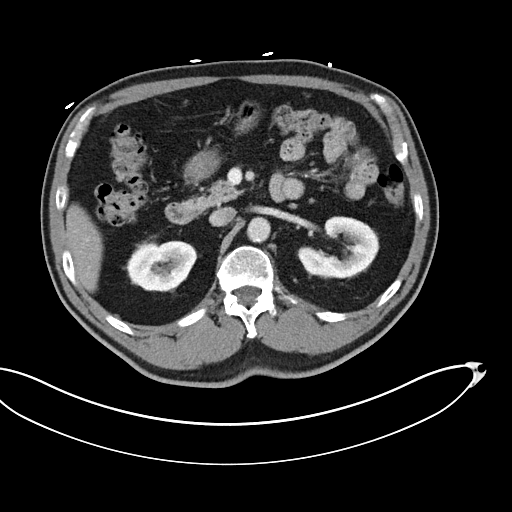
[im 85/107  soft-tissue]
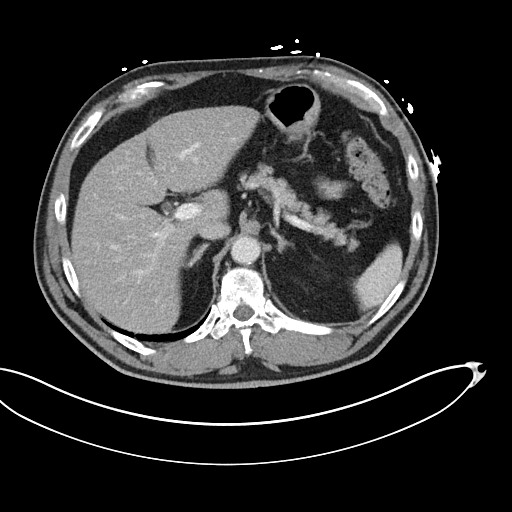
[im 91/107  soft-tissue]
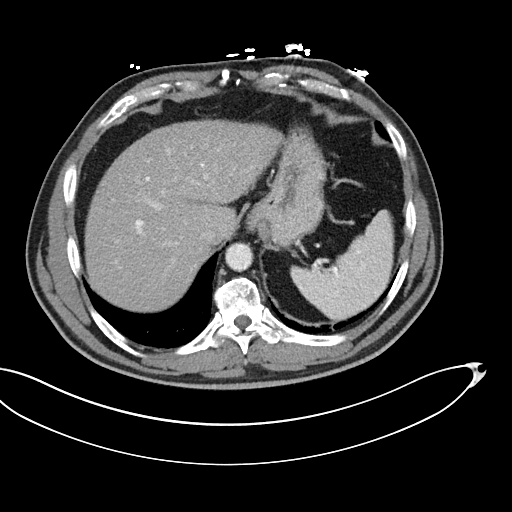
[im 101/107  soft-tissue]
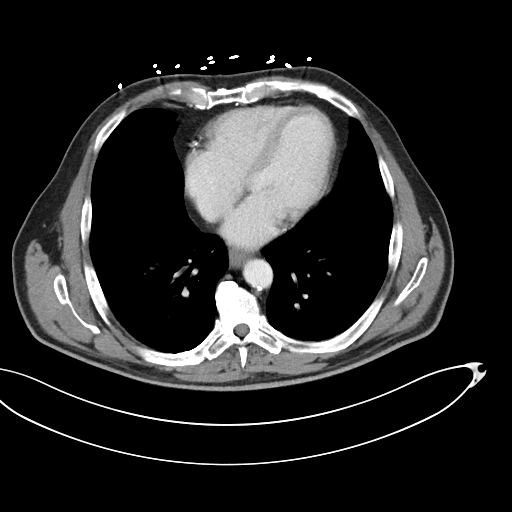

[Series 6: coronal soft tissue · coronal · 0.87mm/px · 3 of 100 slices shown]
[im 34/100  soft-tissue]
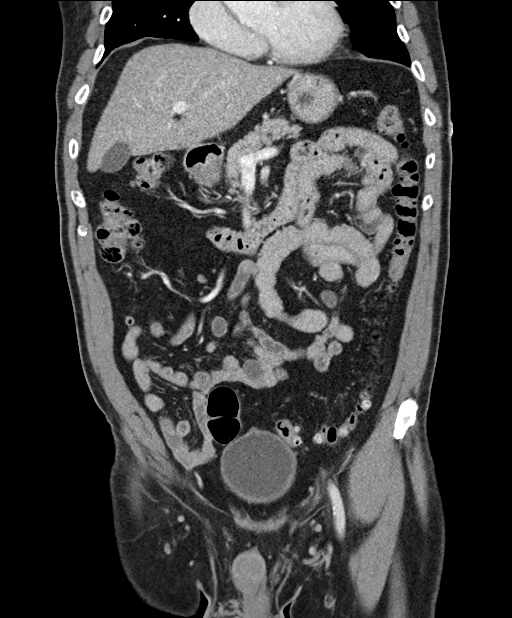
[im 45/100  soft-tissue]
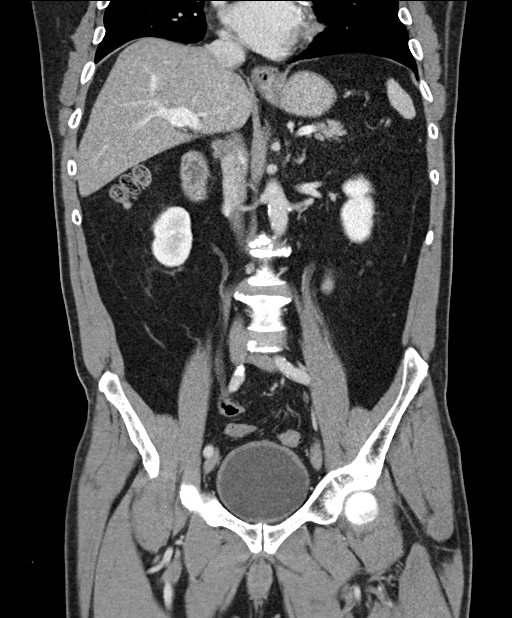
[im 56/100  soft-tissue]
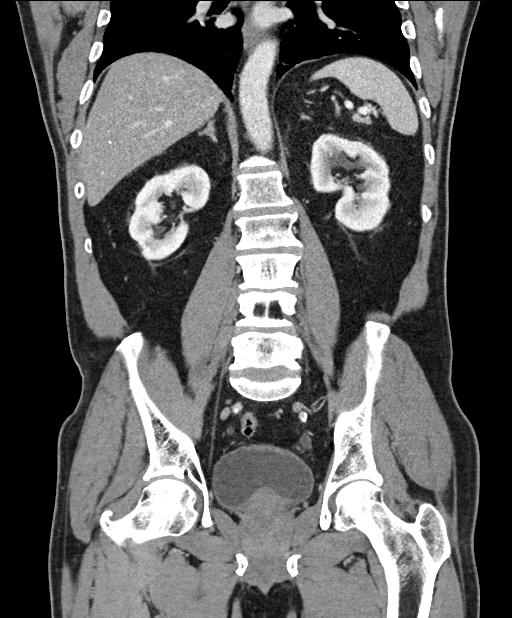

[16 of 46 positions shown; findings below may reference images not displayed]

FINDINGS: Lower chest: Dependent atelectasis.  No acute findings.

Hepatobiliary: Small gallstones layering within the gallbladder. No
focal hepatic abnormality. Suspect mild fatty infiltration.

Pancreas: No focal abnormality or ductal dilatation.

Spleen: No focal abnormality.  Normal size.

Adrenals/Urinary Tract: No adrenal abnormality. No focal renal
abnormality. No stones or hydronephrosis. Urinary bladder is
unremarkable.

Stomach/Bowel: Normal appendix. Sigmoid diverticulosis. No active
diverticulitis. Stomach and small bowel decompressed.

Vascular/Lymphatic: Aortic atherosclerosis. No enlarged abdominal or
pelvic lymph nodes.

Reproductive: Prostate enlargement.  Central calcifications.

Other: No free fluid or free air. Bilateral inguinal hernias
containing fat.

Musculoskeletal: No acute bony abnormality.
IMPRESSION: Mild fatty infiltration of the liver.

Small layering gallstones.

Sigmoid diverticulosis.  No active diverticulitis.

Prostate enlargement.

Bilateral inguinal hernias containing fat.

Aortic atherosclerosis.

## 2019-02-25 ENCOUNTER — Other Ambulatory Visit: Payer: Self-pay | Admitting: Pharmacy Technician

## 2019-02-25 NOTE — Patient Outreach (Signed)
[facility]) Care Management  02/25/2019  Gregory Friedman 03-10-1950 [id number]   Received fax approval from [place] stating patient has been approved for [place] thru [employer] from 1/27 until 01/23/20. Medication be delivered to providers office ([facility].  Will remove myself from care team  Maud Deed. Chana Bode [employer] Certified Pharmacy Technician [place] Management Direct Dial:[phone]

## 2019-02-26 DIAGNOSIS — H8112 Benign paroxysmal vertigo, left ear: Secondary | ICD-10-CM | POA: Diagnosis not present

## 2019-02-26 DIAGNOSIS — M542 Cervicalgia: Secondary | ICD-10-CM | POA: Diagnosis not present

## 2019-02-27 ENCOUNTER — Ambulatory Visit (INDEPENDENT_AMBULATORY_CARE_PROVIDER_SITE_OTHER): Payer: Medicare HMO | Admitting: Internal Medicine

## 2019-02-27 ENCOUNTER — Other Ambulatory Visit: Payer: Self-pay

## 2019-02-27 VITALS — BP 122/74 | HR 60 | Temp 97.8°F | Ht 67.4 in | Wt 196.8 lb

## 2019-02-27 DIAGNOSIS — IMO0002 Reserved for concepts with insufficient information to code with codable children: Secondary | ICD-10-CM

## 2019-02-27 DIAGNOSIS — S0181XD Laceration without foreign body of other part of head, subsequent encounter: Secondary | ICD-10-CM | POA: Diagnosis not present

## 2019-02-27 DIAGNOSIS — E1165 Type 2 diabetes mellitus with hyperglycemia: Secondary | ICD-10-CM | POA: Diagnosis not present

## 2019-02-27 DIAGNOSIS — R7989 Other specified abnormal findings of blood chemistry: Secondary | ICD-10-CM | POA: Diagnosis not present

## 2019-02-27 DIAGNOSIS — S60512D Abrasion of left hand, subsequent encounter: Secondary | ICD-10-CM

## 2019-02-27 DIAGNOSIS — Z794 Long term (current) use of insulin: Secondary | ICD-10-CM | POA: Diagnosis not present

## 2019-02-27 DIAGNOSIS — E11618 Type 2 diabetes mellitus with other diabetic arthropathy: Secondary | ICD-10-CM

## 2019-02-27 MED ORDER — MECLIZINE HCL 25 MG PO TABS: 25.0000 mg | ORAL_TABLET | Freq: Three times a day (TID) | ORAL | 1 refills | Status: DC

## 2019-02-27 MED ORDER — TRESIBA FLEXTOUCH 100 UNIT/ML ~~LOC~~ SOPN: 45.0000 [IU] | PEN_INJECTOR | Freq: Every day | SUBCUTANEOUS | 0 refills | Status: DC

## 2019-02-27 NOTE — Progress Notes (Signed)
* This visit occurred during the SARS-CoV-2 public health emergency.  Safety protocols were in place, including screening questions prior to the visit, additional usage of staff PPE, and extensive cleaning of exam room while observing appropriate contact time as indicated for disinfecting solutions.  Subjective:     Patient ID: Gregory Friedman , male    DOB: 31-Aug-1950 , 69 y.o.   MRN: [id number]   Chief Complaint  Patient presents with  . Laceration    HPI Pt is here for Fu head laceration. Feels area is healing well.  Has not been journaling his glucose Since L knee biopsy and not been exersicing and his numbers been elevated  He fell a couple of weeks ago and lacerated his forehead and is healing fine.  He increased his tresiba to 45 U 2 weeks. This am glucose has been 115-130.    Past Medical History:  Diagnosis Date  . Anxiety   . BPH (benign prostatic hyperplasia)   . Cancer ([place]) 2014   neck area  . Colon polyps   . Coronary artery disease   . Depression   . Diverticulosis   . GERD (gastroesophageal reflux disease)   . History of gout    toe  . 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 Hyperlipidemia   . Hypertension    states under control with med., has been on med. since 2003  . 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 Myocardial infarction ([place]) 2003  . Non-insulin dependent type 2 diabetes mellitus ([place])   . PONV (postoperative nausea and vomiting)   . S/P radiation therapy    Received 8 fractions, then stopped due to mucositis with Radiation/ Cisplatin  . Status post dilation of esophageal narrowing   . Trigger thumb of right hand 01/2016     Family History  Problem Relation Age of Onset  . Stroke Sister   . Diabetes Brother   . Seizures Mother   . Asthma Father   . Thyroid cancer Daughter   . Diabetes Sister   . Parkinson's disease Brother   . Heart disease Brother    . Diabetes Brother   . Colon cancer Neg Hx      Current Outpatient Medications:  .  Accu-Chek FastClix Lancets MISC, CHECK BLOOD SUGAR ONE TIME DAILY AS DIRECTED, Disp: 150 each, Rfl: 2 .  aspirin EC 81 MG tablet, Take 81 mg by mouth at bedtime. , Disp: , Rfl:  .  Coenzyme Q10 200 MG capsule, Take 200 mg by mouth at bedtime. , Disp: , Rfl:  .  enalapril (VASOTEC) 10 MG tablet, TAKE 1 TABLET EVERY DAY (Patient taking differently: Take 10 mg by mouth at bedtime. ), Disp: 90 tablet, Rfl: 1 .  glucose blood (ACCU-CHEK GUIDE) test strip, 1 each by Other route 2 (two) times daily. Use as instructed, Disp: 200 each, Rfl: 12 .  insulin degludec (TRESIBA FLEXTOUCH) 100 UNIT/ML SOPN FlexTouch Pen, Inject 40   Units subcutaneously daily at bedtime (Patient taking differently: Inject 45 Units into the skin daily. ), Disp: 15 pen, Rfl: 3 .  meclizine (ANTIVERT) 25 MG tablet, Take 1 tablet (25 mg total) by mouth 3 (three) times daily. For dizziness, Disp: 30 tablet, Rfl: 0 .  metoprolol succinate (TOPROL-XL) 25 MG 24 hr tablet, Take 2 tablets (50 mg total) by mouth daily. (Patient taking differently: Take 50 mg by mouth  at bedtime. ), Disp: 180 tablet, Rfl: 3 .  nitroGLYCERIN (NITROSTAT) 0.4 MG SL tablet, Place 1 tablet (0.4 mg total) under the tongue every 5 (five) minutes as needed. Chest pain (Patient taking differently: Place 0.4 mg under the tongue every 5 (five) minutes as needed for chest pain. ), Disp: 25 tablet, Rfl: 3 .  omeprazole (PRILOSEC) 40 MG capsule, TAKE 1 CAPSULE EVERY DAY BEFORE A MEAL (Patient taking differently: Take 40 mg by mouth at bedtime. ), Disp: 90 capsule, Rfl: 0 .  Saw Palmetto, Serenoa repens, 320 MG CAPS, Take 320 mg by mouth at bedtime. , Disp: , Rfl:  .  simvastatin (ZOCOR) 40 MG tablet, Take 1 tablet (40 mg total) by mouth every evening. (Patient taking differently: Take 40 mg by mouth at bedtime. ), Disp: 90 tablet, Rfl: 1   No Known Allergies   Review of Systems  Has  been having bouts of sadness, has been gaining wt due to inactivity, his glucose have been elevated, head laceration and hand abrasions are healing, L knee rash unresolved and still has not heard from biopsy done from this site. He plans to stop by Derm office on his way out today. Denies chest pain, SOB, sweating, abdominal pain, urinary difficulty, edema or hypoglycemic episodes.  Today's Vitals   02/27/19 1530  BP: 122/74  Pulse: 60  Temp: 97.8 F (36.6 C)  TempSrc: Oral  Weight: 196 lb 12.8 oz (89.3 kg)  Height: 5' 7.4" (1.712 m)   Body mass index is 30.46 kg/m.   Objective:  Physical Exam  Constitutional: he is oriented to person, place, and time. he appears well-developed and well-nourished. No distress.  HENT:  Head: Normocephalic, has a large scar on R top forehead which is healing well and there are no signs of infection.   Right Ear: External ear normal.  Left Ear: External ear normal.  Nose: Nose normal.  Eyes: Conjunctivae are normal. Right eye exhibits no discharge. Left eye exhibits no discharge. No scleral icterus.  Neck: Neck supple. No thyromegaly present.  No carotid bruits bilaterally  Cardiovascular: Normal rate and regular rhythm.  No murmur heard. Pulmonary/Chest: Effort normal and breath sounds normal. No respiratory distress.  Musculoskeletal: Normal range of motion. he exhibits no edema.  Lymphadenopathy: he has no cervical adenopathy.  Neurological: he is alert and oriented to person, place, and time.  Skin: Skin is warm and dry. Capillary refill takes less than 2 seconds. Has a circular dollar size rash on L knee with white scales, superficial abrasion on mid dorsal middle finger which is healing.  Psychiatric: he has a normal mood and affect. Her behavior is normal. Judgment and thought content normal.  Nursing note reviewed.     Assessment And Plan:     1. Uncontrolled  Diabetes mellitus type 2, insulin dependent ([place])- not well controlled. Advised  to increase the Tresiba 50 U nits. FU in 2 weeks Asked to bring his glucose readings.   2. Elevated serum creatinine- chronic.    Will come back 2/18 and we will gets labs done then.  3- face laceration- healing well 4- L hand abrasion- healing.     Gregory Peale RODRIGUEZ-SOUTHWORTH, PA-C    THE PATIENT IS ENCOURAGED TO PRACTICE SOCIAL DISTANCING DUE TO THE COVID-19 PANDEMIC.

## 2019-03-01 ENCOUNTER — Other Ambulatory Visit: Payer: Self-pay | Admitting: Internal Medicine

## 2019-03-06 DIAGNOSIS — M542 Cervicalgia: Secondary | ICD-10-CM | POA: Diagnosis not present

## 2019-03-06 DIAGNOSIS — H8112 Benign paroxysmal vertigo, left ear: Secondary | ICD-10-CM | POA: Diagnosis not present

## 2019-03-09 NOTE — Progress Notes (Signed)
This visit occurred during the SARS-CoV-2 public health emergency.  Safety protocols were in place, including screening questions prior to the visit, additional usage of staff PPE, and extensive cleaning of exam room while observing appropriate contact time as indicated for disinfecting solutions.  Subjective:     Patient ID: Gregory Friedman , male    DOB: 1950-08-21 , 69 y.o.   MRN: [id number]   Chief Complaint  Patient presents with  . remove stiches    HPI  He presents today to have his sutures removed. He was seen in [facility] ER on 1/18 after a fall.  He reports that he was hauling groceries into his house when he tripped and fell onto the second step going into his house.  No loss of consciousness, sustained a laceration to the right forehead.  Called EMS, they assessed the patient and took him to ER for evaluation.  He did not have any visual sx, headache upon presentation to ER. He did have some discomfort at area of laceration on forehead.   He reports having a total of 9 stitches placed. They are not bothering him, but he states it is time to have them removed.     Past Medical History:  Diagnosis Date  . Anxiety   . BPH (benign prostatic hyperplasia)   . Cancer ([place]) 2014   neck area  . Colon polyps   . Coronary artery disease   . Depression   . Diverticulosis   . GERD (gastroesophageal reflux disease)   . History of gout    toe  . 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 Hyperlipidemia   . Hypertension    states under control with med., has been on med. since 2003  . 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 Myocardial infarction ([place]) 2003  . Non-insulin dependent type 2 diabetes mellitus ([place])   . PONV (postoperative nausea and vomiting)   . S/P radiation therapy    Received 8 fractions, then stopped due to mucositis with Radiation/ Cisplatin  . Status post dilation of esophageal  narrowing   . Trigger thumb of right hand 01/2016     Family History  Problem Relation Age of Onset  . Stroke Sister   . Diabetes Brother   . Seizures Mother   . Asthma Father   . Thyroid cancer Daughter   . Diabetes Sister   . Parkinson's disease Brother   . Heart disease Brother   . Diabetes Brother   . Colon cancer Neg Hx      Current Outpatient Medications:  .  Accu-Chek FastClix Lancets MISC, CHECK BLOOD SUGAR ONE TIME DAILY AS DIRECTED, Disp: 150 each, Rfl: 2 .  aspirin EC 81 MG tablet, Take 81 mg by mouth at bedtime. , Disp: , Rfl:  .  Coenzyme Q10 200 MG capsule, Take 200 mg by mouth at bedtime. , Disp: , Rfl:  .  enalapril (VASOTEC) 10 MG tablet, TAKE 1 TABLET EVERY DAY (Patient taking differently: Take 10 mg by mouth at bedtime. ), Disp: 90 tablet, Rfl: 1 .  glucose blood (ACCU-CHEK GUIDE) test strip, 1 each by Other route 2 (two) times daily. Use as instructed, Disp: 200 each, Rfl: 12 .  metoprolol succinate (TOPROL-XL) 25 MG 24 hr tablet, Take 2 tablets (50 mg total) by mouth daily. (Patient taking differently: Take 50 mg by mouth at bedtime. ), Disp: 180  tablet, Rfl: 3 .  nitroGLYCERIN (NITROSTAT) 0.4 MG SL tablet, Place 1 tablet (0.4 mg total) under the tongue every 5 (five) minutes as needed. Chest pain (Patient taking differently: Place 0.4 mg under the tongue every 5 (five) minutes as needed for chest pain. ), Disp: 25 tablet, Rfl: 3 .  omeprazole (PRILOSEC) 40 MG capsule, TAKE 1 CAPSULE EVERY DAY BEFORE A MEAL (Patient taking differently: Take 40 mg by mouth at bedtime. ), Disp: 90 capsule, Rfl: 0 .  Saw Palmetto, Serenoa repens, 320 MG CAPS, Take 320 mg by mouth at bedtime. , Disp: , Rfl:  .  simvastatin (ZOCOR) 40 MG tablet, Take 1 tablet (40 mg total) by mouth every evening. (Patient taking differently: Take 40 mg by mouth at bedtime. ), Disp: 90 tablet, Rfl: 1 .  insulin degludec (TRESIBA FLEXTOUCH) 100 UNIT/ML SOPN FlexTouch Pen, Inject 0.45 mLs (45 Units total)  into the skin daily., Disp: 1 mL, Rfl: 0 .  meclizine (ANTIVERT) 25 MG tablet, TAKE 1 TABLET THREE TIMES DAILY FOR DIZZINESS, Disp: 180 tablet, Rfl: 1   No Known Allergies   Review of Systems  Constitutional: Negative.   Respiratory: Negative.   Cardiovascular: Negative.   Gastrointestinal: Negative.   Neurological: Negative.   Psychiatric/Behavioral: Negative.      Today's Vitals   02/17/19 1452  BP: (!) 128/96  Pulse: (!) 57  Temp: 98.1 F (36.7 C)  TempSrc: Oral  Weight: 195 lb 6.4 oz (88.6 kg)  Height: 5' 7.4" (1.712 m)  PainSc: 0-No pain   Body mass index is 30.24 kg/m.   Objective:  Physical Exam Vitals and nursing note reviewed.  Constitutional:      Appearance: Normal appearance.  Eyes:     Comments: Resolving periorbital ecchymoses. There is no orbital tenderness  Cardiovascular:     Rate and Rhythm: Normal rate and regular rhythm.     Heart sounds: Normal heart sounds.  Pulmonary:     Effort: Pulmonary effort is normal.     Breath sounds: Normal breath sounds.  Skin:    General: Skin is warm.     Comments: Frontal laceration, no drainage from lesion. Lots of dried blood over laceration, no surrounding erythema. Abrasions on left hand.   Neurological:     General: No focal deficit present.     Mental Status: He is alert.  Psychiatric:        Mood and Affect: Mood normal.         Assessment And Plan:     1. Laceration of forehead, complicated, subsequent encounter  ER records from 1/18 were reviewed in full detail. It appears he had 3 subcu stitches placed, then 6 stitches placed in skin. Unfortunately, this area is not well healed/approximated. All sutures but one were removed. He has f/u appt next week with his provider, Sunday Spillers, he will f/u with her to have this removed.   2. Visit for suture removal  Five stitches were removed. It was difficult to remove these stitches, more than 30 minutes was spent with the patient. All questions were  answered to his satisfaction. He is encouraged to f/u next week for f/u.   Maximino Greenland, MD    THE PATIENT IS ENCOURAGED TO PRACTICE SOCIAL DISTANCING DUE TO THE COVID-19 PANDEMIC.

## 2019-03-13 ENCOUNTER — Ambulatory Visit: Payer: Medicare HMO | Admitting: Internal Medicine

## 2019-03-25 ENCOUNTER — Other Ambulatory Visit: Payer: Self-pay | Admitting: Internal Medicine

## 2019-03-25 DIAGNOSIS — E785 Hyperlipidemia, unspecified: Secondary | ICD-10-CM

## 2019-03-27 ENCOUNTER — Encounter: Payer: Self-pay | Admitting: Internal Medicine

## 2019-03-27 ENCOUNTER — Ambulatory Visit (INDEPENDENT_AMBULATORY_CARE_PROVIDER_SITE_OTHER): Payer: Medicare HMO | Admitting: Internal Medicine

## 2019-03-27 ENCOUNTER — Other Ambulatory Visit: Payer: Self-pay

## 2019-03-27 VITALS — BP 132/72 | HR 58 | Temp 97.9°F | Ht 67.0 in | Wt 195.6 lb

## 2019-03-27 DIAGNOSIS — I10 Essential (primary) hypertension: Secondary | ICD-10-CM

## 2019-03-27 DIAGNOSIS — Z794 Long term (current) use of insulin: Secondary | ICD-10-CM

## 2019-03-27 DIAGNOSIS — L409 Psoriasis, unspecified: Secondary | ICD-10-CM

## 2019-03-27 DIAGNOSIS — E782 Mixed hyperlipidemia: Secondary | ICD-10-CM

## 2019-03-27 DIAGNOSIS — E119 Type 2 diabetes mellitus without complications: Secondary | ICD-10-CM | POA: Diagnosis not present

## 2019-03-27 MED ORDER — TRIAMCINOLONE ACETONIDE 0.1 % EX CREA: 1.0000 "application " | TOPICAL_CREAM | Freq: Three times a day (TID) | CUTANEOUS | 1 refills | Status: AC

## 2019-03-27 MED ORDER — TRESIBA FLEXTOUCH 100 UNIT/ML ~~LOC~~ SOPN: PEN_INJECTOR | SUBCUTANEOUS | 0 refills | Status: DC

## 2019-03-27 NOTE — Progress Notes (Signed)
This visit occurred during the SARS-CoV-2 public health emergency.  Safety protocols were in place, including screening questions prior to the visit, additional usage of staff PPE, and extensive cleaning of exam room while observing appropriate contact time as indicated for disinfecting solutions.  Subjective:     Patient ID: Gregory Friedman , male    DOB: [birth year] , 69 y.o.   MRN: [id number]   Chief Complaint  Patient presents with  . Diabetes     HPI 1- Here for DM FU and his fasting glucose has been from 117-130.   2- He brought in to me his derm pathology report for his L knee biopsy which shows psoriasis.   Past Medical History:  Diagnosis Date  . Anxiety   . BPH (benign prostatic hyperplasia)   . Cancer ([place]) 2014   neck area  . Colon polyps   . Coronary artery disease   . Depression   . Diverticulosis   . GERD (gastroesophageal reflux disease)   . History of gout    toe  . 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 Hyperlipidemia   . Hypertension    states under control with med., has been on med. since 2003  . 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 Myocardial infarction ([place]) 2003  . Non-insulin dependent type 2 diabetes mellitus ([place])   . PONV (postoperative nausea and vomiting)   . S/P radiation therapy    Received 8 fractions, then stopped due to mucositis with Radiation/ Cisplatin  . Status post dilation of esophageal narrowing   . Trigger thumb of right hand 01/2016     Family History  Problem Relation Age of Onset  . Stroke Sister   . Diabetes Brother   . Seizures Mother   . Asthma Father   . Thyroid cancer Daughter   . Diabetes Sister   . Parkinson's disease Brother   . Heart disease Brother   . Diabetes Brother   . Colon cancer Neg Hx      Current Outpatient Medications:  .  Accu-Chek FastClix Lancets MISC, CHECK BLOOD SUGAR ONE TIME DAILY AS DIRECTED,  Disp: 150 each, Rfl: 2 .  aspirin EC 81 MG tablet, Take 81 mg by mouth at bedtime. , Disp: , Rfl:  .  Coenzyme Q10 200 MG capsule, Take 200 mg by mouth at bedtime. , Disp: , Rfl:  .  enalapril (VASOTEC) 10 MG tablet, TAKE 1 TABLET EVERY DAY, Disp: 90 tablet, Rfl: 1 .  glucose blood (ACCU-CHEK GUIDE) test strip, 1 each by Other route 2 (two) times daily. Use as instructed, Disp: 200 each, Rfl: 12 .  insulin degludec (TRESIBA FLEXTOUCH) 100 UNIT/ML SOPN FlexTouch Pen, Inject 0.45 mLs (45 Units total) into the skin daily., Disp: 1 mL, Rfl: 0 .  meclizine (ANTIVERT) 25 MG tablet, TAKE 1 TABLET THREE TIMES DAILY FOR DIZZINESS, Disp: 180 tablet, Rfl: 1 .  metoprolol succinate (TOPROL-XL) 25 MG 24 hr tablet, Take 2 tablets (50 mg total) by mouth daily. (Patient taking differently: Take 50 mg by mouth at bedtime. ), Disp: 180 tablet, Rfl: 3 .  nitroGLYCERIN (NITROSTAT) 0.4 MG SL tablet, Place 1 tablet (0.4 mg total) under the tongue every 5 (five) minutes as needed. Chest pain (Patient taking differently: Place 0.4 mg under the tongue every 5 (five) minutes as needed for chest pain. ), Disp: 25 tablet, Rfl: 3 .  omeprazole (PRILOSEC) 40 MG capsule, TAKE 1 CAPSULE EVERY DAY BEFORE A MEAL (Patient taking differently: Take 40 mg by mouth at bedtime. ), Disp: 90 capsule, Rfl: 0 .  Saw [place]met[place], [place]ren[place] repens, 320 MG CAPS, Take 320 mg by mouth at bedtime. , Disp: , Rfl:  .  simvastatin (ZOCOR) 40 MG tablet, TAKE 1 TABLET  EVERY EVENING., Disp: 90 tablet, Rfl: 1 .  triamcinolone cream (KENALOG) 0.1 %, Apply 1 application topically 3 (three) times daily. Apply to rash bid x 7-10 days, then prn when rash return, Disp: 80 g, Rfl: 1   No Known Allergies    Review of Systems  Constitutional: Negative for diaphoresis and unexpected weight change.  HENT: Negative for tinnitus.   Eyes: Negative for visual disturbance.  Respiratory: Negative for chest tightness and shortness of breath.   Cardiovascular: Negative for  chest pain, palpitations and leg swelling.  Gastrointestinal: Negative for constipation, diarrhea and nausea.  Endocrine: Negative for polydipsia, polyphagia and polyuria.  Genitourinary: Negative for dysuria and frequency.  Muscular- L knee is sore since he had to work on his pipes on his knees a few weeks ago, but is getting better Skin: Negative for rash and wound.  Neurological: Negative for dizziness, speech difficulty, weakness, numbness and headaches.   Today's Vitals   03/27/19 1601  BP: 132/72  Pulse: (!) 58  Temp: 97.9 F (36.6 C)  Weight: 195 lb 9.6 oz (88.7 kg)  Height: 5\' 7"  (1.702 m)   Body mass index is 30.64 kg/m.   Objective:  Physical Exam  Constitutional: She is oriented to person, place, and time. he appears well-developed and well-nourished. No distress.  HENT:  Head: Normocephalic and atraumatic.  Right Ear: External ear normal.  Left Ear: External ear normal.  Nose: Nose normal.  Eyes: Conjunctivae are normal. Right eye exhibits no discharge. Left eye exhibits no discharge. No scleral icterus.  Neck: Neck supple. No thyromegaly present.  No carotid bruits bilaterally  Cardiovascular: Normal rate and regular rhythm.  No murmur heard. Pulmonary/Chest: Effort normal and breath sounds normal. No respiratory distress.  Musculoskeletal: Normal range of motion.  Lymphadenopathy:    he has no cervical adenopathy.  Neurological: he is alert and oriented to person, place, and time.  Skin: Skin is warm and dry. Capillary refill takes less than 2 seconds. L knee psoriasis rash is unchanged. he is not diaphoretic.  Psychiatric: he has a normal mood and affect. His behavior is normal. Judgment and thought content normal.  Nursing note reviewed. Assessment And Plan:   1. Diabetes mellitus type 2, insulin dependent ([place])- chronic with slightly improved control per glucose at home. He will stay with 50 Units of Tresiba unless his HGBA1C is still over 7.   2.  Psoriasis- L knee, chronic. I placed him on Triamcinolone cream.    Fu prn.  3. HTN- stable. May continue current med 4. - Mixed hyperlipidemia- chronic. May continue same med  Fu in 3 months.    Emry Tobin RODRIGUEZ-SOUTHWORTH, PA-C    THE PATIENT IS ENCOURAGED TO PRACTICE SOCIAL DISTANCING DUE TO THE COVID-19 PANDEMIC.

## 2019-03-28 LAB — HEMOGLOBIN A1C
Est. average glucose Bld gHb Est-mCnc: 223 mg/dL
Hgb A1c MFr Bld: 9.4 % — ABNORMAL HIGH (ref 4.8–5.6)

## 2019-03-31 ENCOUNTER — Other Ambulatory Visit: Payer: Self-pay

## 2019-03-31 ENCOUNTER — Other Ambulatory Visit: Payer: Self-pay | Admitting: Internal Medicine

## 2019-03-31 ENCOUNTER — Ambulatory Visit (INDEPENDENT_AMBULATORY_CARE_PROVIDER_SITE_OTHER): Payer: Medicare HMO

## 2019-03-31 ENCOUNTER — Telehealth: Payer: Medicare HMO

## 2019-03-31 DIAGNOSIS — IMO0002 Reserved for concepts with insufficient information to code with codable children: Secondary | ICD-10-CM

## 2019-03-31 DIAGNOSIS — E11618 Type 2 diabetes mellitus with other diabetic arthropathy: Secondary | ICD-10-CM

## 2019-03-31 DIAGNOSIS — F325 Major depressive disorder, single episode, in full remission: Secondary | ICD-10-CM

## 2019-03-31 DIAGNOSIS — F5101 Primary insomnia: Secondary | ICD-10-CM

## 2019-03-31 MED ORDER — TRESIBA FLEXTOUCH 100 UNIT/ML ~~LOC~~ SOPN: PEN_INJECTOR | SUBCUTANEOUS | 0 refills | Status: DC

## 2019-03-31 NOTE — Progress Notes (Signed)
Please inform pt that his DM is worse and needs to increase his Tresiba to 55 units a day, and I need him to see the endocrinologist please.

## 2019-03-31 NOTE — Progress Notes (Signed)
Referral to endocrinology made

## 2019-04-01 ENCOUNTER — Telehealth: Payer: Self-pay

## 2019-04-01 NOTE — Chronic Care Management (AMB) (Addendum)
Chronic Care Management   Follow Up Note   04/01/2019 Name: Gregory Friedman MRN: [id number] DOB: [birth year]  Referred by: Gregory Mattocks, PA-C Reason for referral : Chronic Care Management (FU Call - DM)   Gregory Friedman is a 69 y.o. year old male who is a primary care patient of Gregory Friedman, Gregory Friedman, [place]. The CCM team was consulted for assistance with chronic disease management and care coordination needs.    Review of patient status, including review of consultants reports, relevant laboratory and other test results, and collaboration with appropriate care team members and the patient's provider was performed as part of comprehensive patient evaluation and provision of chronic care management services.    SDOH (Social Determinants of Health) assessments performed: No See Care Plan activities for detailed interventions related to [place] outbound call to patient for a CCM RN CM follow up.      Outpatient Encounter Medications as of 03/31/2019  Medication Sig  . Accu-Chek FastClix Lancets MISC CHECK BLOOD SUGAR ONE TIME DAILY AS DIRECTED  . aspirin EC 81 MG tablet Take 81 mg by mouth at bedtime.   . Coenzyme Q10 200 MG capsule Take 200 mg by mouth at bedtime.   . enalapril (VASOTEC) 10 MG tablet TAKE 1 TABLET EVERY DAY  . glucose blood (ACCU-CHEK GUIDE) test strip 1 each by Other route 2 (two) times daily. Use as instructed  . insulin degludec (TRESIBA FLEXTOUCH) 100 UNIT/ML FlexTouch Pen 55 unites per day  . meclizine (ANTIVERT) 25 MG tablet TAKE 1 TABLET THREE TIMES DAILY FOR DIZZINESS  . metoprolol succinate (TOPROL-XL) 25 MG 24 hr tablet Take 2 tablets (50 mg total) by mouth daily. (Patient taking differently: Take 50 mg by mouth at bedtime. )  . nitroGLYCERIN (NITROSTAT) 0.4 MG SL tablet Place 1 tablet (0.4 mg total) under the tongue every 5 (five) minutes as needed. Chest pain (Patient taking differently: Place 0.4 mg under the tongue every 5  (five) minutes as needed for chest pain. )  . omeprazole (PRILOSEC) 40 MG capsule TAKE 1 CAPSULE EVERY DAY BEFORE A MEAL (Patient taking differently: Take 40 mg by mouth at bedtime. )  . Saw Palmetto, Serenoa repens, 320 MG CAPS Take 320 mg by mouth at bedtime.   . simvastatin (ZOCOR) 40 MG tablet TAKE 1 TABLET  EVERY EVENING.  [name] triamcinolone cream (KENALOG) 0.1 % Apply 1 application topically 3 (three) times daily. Apply to rash bid x 7-10 days, then prn when rash return   No facility-administered encounter medications on file as of 03/31/2019.     Objective:  Lab Results  Component Value Date   HGBA1C 9.4 (H) 03/27/2019   HGBA1C 8.5 (H) 12/05/2018   HGBA1C 8.2 (H) 09/12/2018   Lab Results  Component Value Date   MICROALBUR 10 10/31/2018   LDLCALC 79 10/31/2018   CREATININE 1.52 (H) 02/10/2019   BP Readings from Last 3 Encounters:  03/27/19 132/72  02/27/19 122/74  02/17/19 (!) 128/96    Goals Addressed            This Visit's Progress     Patient Stated   . "I would like to talk to you about my diabetes" (pt-stated)       Current Barriers:  [name] Knowledge Deficits related to disease process and self health management for Diabetes . Chronic Disease Management support and education needs related to DMII, Insomnia, Depression   Nurse Case Manager & PharmD Clinical Goal(s):  [name] Over the next 90 days, patient  will demonstrate improved adherence to his prescribed treatment plan for Diabetes Management as evidenced by verbalizing understanding and adherence to monitoring CBGs, taking insulin as prescribed and improved adherence to following a Diabetic diet. 12/24/18: Goal date revised to 90 days due to delays in treatment secondary to COVID-19. Goal Met . 04/01/19 New Over the next 45 days, patient will complete a new patient appointment with Endocrinology for further evaluation and treatment of DMII  CCM RN CM Interventions:  04/01/19 call completed with patient  . Evaluation of  current treatment plan related to DMII and patient's adherence to plan as established by provider. . Provided education to patient re: A1C is up to 9.1; Reiterated target A1C <7.0; Reviewed and discussed patient's current diet; patient reports adhering to a diabetic friendly diet with low carbohydrates; Reiterated importance of portion control and establishing a routine exercise regimen, discussed ADA recommendations for 150 minutes per week; Reviewed patient reported daily glycemic control within range 80-130  . Reviewed medications with patient and discussed indication, frequency and dosage of Tresiba and patient is adhering, no noted SE; Reviewed and discussed past antiglycemic medications; Discussed and reviewed PCP has sent referral for Endocrinology to further evaluate and treat his DM . Advised patient, providing education and rationale, to check cbg daily before meals and record, calling the CCM team and or PCP for findings outside established parameters   . Collaborated with embedded Pharm D Gregory Friedman regarding follow up is needed to review and recommend alternate treatment for DM . Discussed plans with patient for ongoing care management follow up and provided patient with direct contact information for care management team . Mailed printed updated patient DM education materials to be mailed   CCM PharmD Interventions: Telephone CCM follow up completed with patient on 02/03/2019 . Comprehensive medication review performed and list updated in the EMR.  No changes to current regimen . Patient is tolerating therapy well and denies adverse events. . His FBG this AM was 130.  FBG range 130-180.  His last A1c is 8.5% on 02/03/18 (A1c has remained 8.2% x3 this year).  He denies hypoglycemia (BG<70).  He reports that most of his FBGs range from 100-200s depending on his night time snacking habits.   . Reviewed meal planning and how to find alternatives to snacks with higher carbohydrates.  He states  he has decreased his ice cream and potato chip intake.  Reminded patient to continue to avoid eating too many bananas.  He has been eating 1-2 bananas a day.  He is more mindful of snacking and is avoiding foods with higher carbohydrate intake.  Encouraged patient to increase water intake and begin to exercise as able and as recommended by PCP. [name] Patient continues to inject 40 units daily of Tresiba as monotherapy for his diabetes.  He injects [place] in the mornings and this has proven to work well with his schedule.  Encouraged patient to continue his great progress.  Patient states he needs to increase his activity level . Patient has been approved for [place] through [employer] patient assistance program until 01/23/19.  Appication sent for 2021.  This will help him work to achieve target A1c~7%.  Optimization of DM therapy: o Patient reports throat swelling with Trulicity that resulted in an ED admission.  Will not start GLP1 due to this reported reaction.  Allergy lost updated in the EMR o Patient unable to continue metformin due to kidney function. o Patient unable to tolerated SGLT2 due to recurrent yeast  infections o Patient does not wish to add therapy at this time and is hopeful of his diet control and exercise.  He states his knee if better and he is able to participate in exercise now.  Will follow up next week. o Increase Tresiba to 42 units daily (AM per patient preference)  Patient Self Care Activities:  . Self administers medications as prescribed . Attends all scheduled provider appointments . Calls pharmacy for medication refills . Performs ADL's independently . Performs IADL's independently . Calls provider office for new concerns or questions  Please see past updates related to this goal by clicking on the "Past Updates" button in the selected goal           Plan:   Telephone follow up appointment with care management team member scheduled for:   Barb Merino, RN,  BSN, CCM Care Management Coordinator [facility]  Direct Phone: [phone]

## 2019-04-01 NOTE — Telephone Encounter (Signed)
Spoke w/pt wife she stated they already seen labs in chart also reviewed note from provider [place], Sunday Spillers, PA-C  Candiss Norse T, [place]  Please inform pt that his DM is worse and needs to increase his [name] to 55 units a day, and I need him to see the endocrinologist please.

## 2019-04-07 ENCOUNTER — Telehealth: Payer: Medicare HMO

## 2019-04-07 ENCOUNTER — Ambulatory Visit: Payer: Self-pay

## 2019-04-07 ENCOUNTER — Other Ambulatory Visit: Payer: Self-pay

## 2019-04-07 ENCOUNTER — Ambulatory Visit: Payer: Self-pay | Admitting: Pharmacist

## 2019-04-07 DIAGNOSIS — Z794 Long term (current) use of insulin: Secondary | ICD-10-CM

## 2019-04-07 DIAGNOSIS — E1165 Type 2 diabetes mellitus with hyperglycemia: Secondary | ICD-10-CM | POA: Diagnosis not present

## 2019-04-07 DIAGNOSIS — E11618 Type 2 diabetes mellitus with other diabetic arthropathy: Secondary | ICD-10-CM

## 2019-04-07 DIAGNOSIS — F325 Major depressive disorder, single episode, in full remission: Secondary | ICD-10-CM

## 2019-04-07 DIAGNOSIS — F5101 Primary insomnia: Secondary | ICD-10-CM

## 2019-04-07 DIAGNOSIS — IMO0002 Reserved for concepts with insufficient information to code with codable children: Secondary | ICD-10-CM

## 2019-04-07 NOTE — Patient Instructions (Addendum)
Visit Information  Goals Addressed      Patient Stated   . "I would like to talk to you about my diabetes" (pt-stated)       Current Barriers:  [name] Knowledge Deficits related to disease process and self health management for Diabetes . Chronic Disease Management support and education needs related to DMII, Insomnia, Depression   Nurse Case Manager & PharmD Clinical Goal(s):  [name] Over the next 90 days, patient will demonstrate improved adherence to his prescribed treatment plan for Diabetes Management as evidenced by verbalizing understanding and adherence to monitoring CBGs, taking insulin as prescribed and improved adherence to following a Diabetic diet. 12/24/18: Goal date revised to 90 days due to delays in treatment secondary to COVID-19. Goal Met . 04/01/19 New Over the next 45 days, patient will complete a new patient appointment with Endocrinology for further evaluation and treatment of DMII  CCM RN CM Interventions:  04/01/19 call completed with patient  . Evaluation of current treatment plan related to DMII and patient's adherence to plan as established by provider. . Provided education to patient re: A1C is up to 9.1; Reiterated target A1C <7.0; Reviewed and discussed patient's current diet; patient reports adhering to a diabetic friendly diet with low carbohydrates; Reiterated importance of portion control and establishing a routine exercise regimen, discussed ADA recommendations for 150 minutes per week; Reviewed patient reported daily glycemic control within range 80-130  . Reviewed medications with patient and discussed indication, frequency and dosage of Tresiba and patient is adhering, no noted SE; Reviewed and discussed past antiglycemic medications; Discussed and reviewed PCP has sent referral for Endocrinology to further evaluate and treat his DM . Advised patient, providing education and rationale, to check cbg daily before meals and record, calling the CCM team and or PCP for  findings outside established parameters . Collaborated with embedded Pharm D Lottie Dawson regarding follow up is needed to review and recommend alternate treatment for DM . Discussed plans with patient for ongoing care management follow up and provided patient with direct contact information for care management team . Mailed printed updated patient DM education materials to be mailed     CCM PharmD Interventions: Telephone CCM follow up completed with patient on 02/03/2019 . Comprehensive medication review performed and list updated in the EMR.  No changes to current regimen . Patient is tolerating therapy well and denies adverse events. . His FBG this AM was 130.  FBG range 130-180.  His last A1c is 8.5% on 02/03/18 (A1c has remained 8.2% x3 this year).  He denies hypoglycemia (BG<70).  He reports that most of his FBGs range from 100-200s depending on his night time snacking habits.   . Reviewed meal planning and how to find alternatives to snacks with higher carbohydrates.  He states he has decreased his ice cream and potato chip intake.  Reminded patient to continue to avoid eating too many bananas.  He has been eating 1-2 bananas a day.  He is more mindful of snacking and is avoiding foods with higher carbohydrate intake.  Encouraged patient to increase water intake and begin to exercise as able and as recommended by PCP. [name] Patient continues to inject 40 units daily of Tresiba as monotherapy for his diabetes.  He injects [place] in the mornings and this has proven to work well with his schedule.  Encouraged patient to continue his great progress.  Patient states he needs to increase his activity level . Patient has been approved for [place] through  [employer] patient assistance program until 01/23/19.  Appication sent for 2021.  This will help him work to achieve target A1c~7%.  Optimization of DM therapy: o Patient reports throat swelling with Trulicity that resulted in an ED admission.  Will not  start GLP1 due to this reported reaction.  Allergy lost updated in the EMR o Patient unable to continue metformin due to kidney function. o Patient unable to tolerated SGLT2 due to recurrent yeast infections o Patient does not wish to add therapy at this time and is hopeful of his diet control and exercise.  He states his knee if better and he is able to participate in exercise now.  Will follow up next week. o Increase Tresiba to 42 units daily (AM per patient preference)  Patient Self Care Activities:  . Self administers medications as prescribed . Attends all scheduled provider appointments . Calls pharmacy for medication refills . Performs ADL's independently . Performs IADL's independently . Calls provider office for new concerns or questions  Please see past updates related to this goal by clicking on the "Past Updates" button in the selected goal          Patient verbalizes understanding of instructions provided today.   Telephone follow up appointment with care management team member scheduled for: 04/29/19  Barb Merino, RN, BSN, CCM Care Management Coordinator [place] [facility]  Direct Phone: [phone]

## 2019-04-07 NOTE — Chronic Care Management (AMB) (Addendum)
  Chronic Care Management   Follow Up Note   04/07/2019 Name: Gregory Friedman MRN: [id number] DOB: 09/02/1950  Referred by: Shelby Mattocks, PA-C Reason for referral : Chronic Care Management (Chart Review and Addendum completed)   [name] is a 69 y.o. year old male who is a primary care patient of [place], Sunday Spillers, [place]. The CCM team was consulted for assistance with chronic disease management and care coordination needs.    Review of patient status, including review of consultants reports, relevant laboratory and other test results, and collaboration with appropriate care team members and the patient's provider was performed as part of comprehensive patient evaluation and provision of chronic care management services.     Chart Addendum to 04/01/19 CCM RN CM patient follow up call.   Barb Merino, RN, BSN, CCM Care Management Coordinator [place] [facility]  Direct Phone: [phone]

## 2019-04-08 NOTE — Patient Instructions (Addendum)
Visit Information  Goals Addressed            This Visit's Progress     Patient Stated   . "I would like to talk to you about my diabetes" (pt-stated)       Current Barriers:  [name] Knowledge Deficits related to disease process and self health management for Diabetes . Chronic Disease Management support and education needs related to DMII, Insomnia, Depression   Nurse Case Manager & PharmD Clinical Goal(s):  [name] Over the next 90 days, patient will demonstrate improved adherence to his prescribed treatment plan for Diabetes Management as evidenced by verbalizing understanding and adherence to monitoring CBGs, taking insulin as prescribed and improved adherence to following a Diabetic diet. 12/24/18: Goal date revised to 90 days due to delays in treatment secondary to COVID-19. Goal Met . 04/01/19 New Over the next 45 days, patient will complete a new patient appointment with Endocrinology for further evaluation and treatment of DMII  CCM RN CM Interventions:  04/01/19 call completed with patient  . Evaluation of current treatment plan related to DMII and patient's adherence to plan as established by provider. . Provided education to patient re: A1C is up to 9.1; Reiterated target A1C <7.0; Reviewed and discussed patient's current diet; patient reports adhering to a diabetic friendly diet with low carbohydrates; Reiterated importance of portion control and establishing a routine exercise regimen, discussed ADA recommendations for 150 minutes per week; Reviewed patient reported daily glycemic control within range 80-130  . Reviewed medications with patient and discussed indication, frequency and dosage of Tresiba and patient is adhering, no noted SE; Reviewed and discussed past antiglycemic medications; Discussed and reviewed PCP has sent referral for Endocrinology to further evaluate and treat his DM . Advised patient, providing education and rationale, to check cbg daily before meals and record,  calling the CCM team and or PCP for findings outside established parameters . Collaborated with embedded Pharm D Lottie Dawson regarding follow up is needed to review and recommend alternate treatment for DM . Discussed plans with patient for ongoing care management follow up and provided patient with direct contact information for care management team . Mailed printed updated patient DM education materials to be mailed   CCM PharmD Interventions: Telephone CCM follow up completed with patient on 04/07/2019 . Comprehensive medication review performed and list updated in the EMR.  No changes to current regimen . Patient is tolerating therapy well and denies adverse events. . His FBG this AM was 130.  FBG range 130-180.  His last A1c was 9.4% on 03/27/19.  He denies hypoglycemia (BG<70).  He reports that most of his FBGs range from 100-200s depending on his night time snacking habits, however A1c continues to increase.   . Reviewed meal planning and how to find alternatives to snacks with higher carbohydrates.  He states he has decreased his ice cream and potato chip intake.  Reminded patient to continue to avoid eating too many bananas.  He has been eating 1-2 bananas a day.  He is more mindful of snacking and is avoiding foods with higher carbohydrate intake.  Encouraged patient to increase water intake and begin to exercise as able and as recommended by PCP. [name] Patient increased to [place] 55 units daily as monotherapy for his diabetes.  He injects [place] in the mornings and this has proven to work well with his schedule.  Encouraged patient to continue his great progress.  Patient states he needs to increase his activity level .  Patient has been approved for [place] through [employer] patient assistance program until 01/23/20.  75-monthsupplies to be shipped to PCP office.  Refill request form to be sent in around April 2021.  Last refill must be requested by 12/23/19.    .Marland KitchenOptimization of DM  therapy: o Patient reports throat swelling with Trulicity that resulted in an ED admission.  Will not start GLP1 due to this reported reaction.  Allergy list updated in the EMR o Metformin d/c'd due to kidney function. 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  Will message PCP  Patient Self Care Activities:  . Self administers medications as prescribed . Attends all scheduled provider appointments . Calls pharmacy for medication refills . Performs ADL's independently . Performs IADL's independently . Calls provider office for new concerns or questions  Please see past updates related to this goal by clicking on the "Past Updates" button in the selected goal       . COMPLETED: I would like to apply for financial assistance for Tresiba (pt-stated)       Current Barriers:  . Financial Barriers  Pharmacist Clinical Goal(s):  .Marland KitchenOver the next 30 days, patient will work with PharmD to address needs related to applying for financial assistance for TTyler Aas goal re-established on 12/24/18 for 2021 PAP process  Interventions: . Comprehensive medication review performed. . Discussed plans with patient for ongoing care management follow up and provided patient with direct contact information for care management team . Patient has been approved for free Tresiba/pen needles through [employer] program until 01/23/20  Patient Self Care Activities:  . Self administers medications as prescribed . Attends all scheduled provider appointments . Calls pharmacy for medication refills  Please see past updates related to this goal by clicking on the "Past Updates" button in the selected goal         The patient verbalized understanding of instructions provided today and declined a print copy of patient instruction materials.    The care management team will reach out to the patient again over the next 30 days.   SIGNATURE JRegina Eck PharmD, BCPS Clinical Pharmacist, [facility] [place] [phone]

## 2019-04-08 NOTE — Progress Notes (Addendum)
Chronic Care Management    Visit Note  04/07/2019 Name: Gregory Friedman MRN: [id number] DOB: 03-14-1950  Referred by: Shelby Mattocks, PA-C Reason for referral : Chronic Care Management and Diabetes   Gregory Friedman is a 69 y.o. year old male who is a primary care patient of [place], [place], [place]. The CCM team was consulted for assistance with chronic disease management and care coordination needs related to DMII  Review of patient status, including review of consultants reports, relevant laboratory and other test results, and collaboration with appropriate care team members and the patient's provider was performed as part of comprehensive patient evaluation and provision of chronic care management services.    SDOH (Social Determinants of Health) assessments performed: Yes See Care Plan activities for detailed interventions related to SDOH)     Medications: Outpatient Encounter Medications as of 04/07/2019  Medication Sig  . Accu-Chek FastClix Lancets MISC CHECK BLOOD SUGAR ONE TIME DAILY AS DIRECTED  . aspirin EC 81 MG tablet Take 81 mg by mouth at bedtime.   . Coenzyme Q10 200 MG capsule Take 200 mg by mouth at bedtime.   . enalapril (VASOTEC) 10 MG tablet TAKE 1 TABLET EVERY DAY  . glucose blood (ACCU-CHEK GUIDE) test strip 1 each by Other route 2 (two) times daily. Use as instructed  . insulin degludec (TRESIBA FLEXTOUCH) 100 UNIT/ML FlexTouch Pen 55 unites per day  . meclizine (ANTIVERT) 25 MG tablet TAKE 1 TABLET THREE TIMES DAILY FOR DIZZINESS  . metoprolol succinate (TOPROL-XL) 25 MG 24 hr tablet Take 2 tablets (50 mg total) by mouth daily. (Patient taking differently: Take 50 mg by mouth at bedtime. )  . nitroGLYCERIN (NITROSTAT) 0.4 MG SL tablet Place 1 tablet (0.4 mg total) under the tongue every 5 (five) minutes as needed. Chest pain (Patient taking differently: Place 0.4 mg under the tongue every 5 (five) minutes as needed for chest pain. )    . omeprazole (PRILOSEC) 40 MG capsule TAKE 1 CAPSULE EVERY DAY BEFORE A MEAL (Patient taking differently: Take 40 mg by mouth at bedtime. )  . Saw Palmetto, Serenoa repens, 320 MG CAPS Take 320 mg by mouth at bedtime.   . simvastatin (ZOCOR) 40 MG tablet TAKE 1 TABLET  EVERY EVENING.  [name] triamcinolone cream (KENALOG) 0.1 % Apply 1 application topically 3 (three) times daily. Apply to rash bid x 7-10 days, then prn when rash return   No facility-administered encounter medications on file as of 04/07/2019.     Objective:   Goals Addressed            This Visit's Progress     Patient Stated   . "I would like to talk to you about my diabetes" (pt-stated)       Current Barriers:  [name] Knowledge Deficits related to disease process and self health management for Diabetes . Chronic Disease Management support and education needs related to DMII, Insomnia, Depression   Nurse Case Manager & PharmD Clinical Goal(s):  [name] Over the next 90 days, patient will demonstrate improved adherence to his prescribed treatment plan for Diabetes Management as evidenced by verbalizing understanding and adherence to monitoring CBGs, taking insulin as prescribed and improved adherence to following a Diabetic diet. 12/24/18: Goal date revised to 90 days due to delays in treatment secondary to COVID-19. Goal Met . 04/01/19 New Over the next 45 days, patient will complete a new patient appointment with Endocrinology for further evaluation and treatment of DMII  CCM RN CM Interventions:  04/01/19  call completed with patient  . Evaluation of current treatment plan related to DMII and patient's adherence to plan as established by provider. . Provided education to patient re: A1C is up to 9.1; Reiterated target A1C <7.0; Reviewed and discussed patient's current diet; patient reports adhering to a diabetic friendly diet with low carbohydrates; Reiterated importance of portion control and establishing a routine exercise regimen,  discussed ADA recommendations for 150 minutes per week; Reviewed patient reported daily glycemic control within range 80-130  . Reviewed medications with patient and discussed indication, frequency and dosage of Tresiba and patient is adhering, no noted SE; Reviewed and discussed past antiglycemic medications; Discussed and reviewed PCP has sent referral for Endocrinology to further evaluate and treat his DM . Advised patient, providing education and rationale, to check cbg daily before meals and record, calling the CCM team and or PCP for findings outside established parameters . Collaborated with embedded Pharm D Lottie Dawson regarding follow up is needed to review and recommend alternate treatment for DM . Discussed plans with patient for ongoing care management follow up and provided patient with direct contact information for care management team . Mailed printed updated patient DM education materials to be mailed   CCM PharmD Interventions: Telephone CCM follow up completed with patient on 04/07/2019 . Comprehensive medication review performed and list updated in the EMR.  No changes to current regimen . Patient is tolerating therapy well and denies adverse events. . His FBG this AM was 130.  FBG range 130-180.  His last A1c was 9.4% on 03/27/19.  He denies hypoglycemia (BG<70).  He reports that most of his FBGs range from 100-200s depending on his night time snacking habits, however A1c continues to increase.   . Reviewed meal planning and how to find alternatives to snacks with higher carbohydrates.  He states he has decreased his ice cream and potato chip intake.  Reminded patient to continue to avoid eating too many bananas.  He has been eating 1-2 bananas a day.  He is more mindful of snacking and is avoiding foods with higher carbohydrate intake.  Encouraged patient to increase water intake and begin to exercise as able and as recommended by PCP. [name] Patient increased to [place] 55 units daily as  monotherapy for his diabetes.  He injects [place] in the mornings and this has proven to work well with his schedule.  Encouraged patient to continue his great progress.  Patient states he needs to increase his activity level . Patient has been approved for [place] through [employer] patient assistance program until 01/23/20.  66-monthsupplies to be shipped to PCP office.  Refill request form to be sent in around April 2021.  Last refill must be requested by 12/23/19.    .Marland KitchenOptimization of DM therapy: o Patient reports throat swelling with Trulicity that resulted in an ED admission.  Will not start GLP1 due to this reported reaction.  Allergy list updated in the EMR o Metformin d/c'd due to kidney function. 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  Will message PCP  Patient Self Care Activities:  . Self administers medications as prescribed . Attends all scheduled provider appointments . Calls pharmacy for medication refills . Performs ADL's independently . Performs IADL's independently . Calls provider office for new concerns or questions  Please see past updates related  to this goal by clicking on the "Past Updates" button in the selected goal       . COMPLETED: I would like to apply for financial assistance for Tresiba (pt-stated)       Current Barriers:  . Financial Barriers  Pharmacist Clinical Goal(s):  [name] Over the next 30 days, patient will work with PharmD to address needs related to applying for financial assistance for [name]  goal re-established on 12/24/18 for 2021 PAP process  Interventions: . Comprehensive medication review performed. . Discussed plans with patient for ongoing care management follow up and provided patient with direct contact information for care management team . Patient has been approved for free  Tresiba/pen needles through [employer] patient assistance program until 01/23/20  Patient Self Care Activities:  . Self administers medications as prescribed . Attends all scheduled provider appointments . Calls pharmacy for medication refills  Please see past updates related to this goal by clicking on the "Past Updates" button in the selected goal          Plan:   The care management team will reach out to the patient again over the next 30 days.   Provider Signature Regina Eck, PharmD, BCPS Clinical Pharmacist, [facility] [place]: [phone]

## 2019-04-11 ENCOUNTER — Encounter: Payer: Self-pay | Admitting: Internal Medicine

## 2019-04-16 NOTE — Progress Notes (Signed)
Cardiology Office Note   Date:  04/17/2019   ID:  Gregory Friedman, DOB [birth year], MRN [id number]  PCP:  Shelby Mattocks, PA-C  Cardiologist:   Dorris Carnes, MD    F/U of CAD      History of Present Illness: Gregory Friedman is a 69 y.o. male with a history ofCAD s/p PCI and CABG (2005 in [place]), hypertension, type 2 diabetes, dyslipidemia, head and neck CA (s/p XRT/surg/chemo) and anxiety Cath in 2012 which showed LIMA to LAD atretic, SVG to OM occluded. There were multiple areas which could be causing chest pain or ischemia ( severe disease in distal LCx or mod disease in diag).   CP in June 2017  Myovue done   Old infarct   No ischemia   Echo done LVEF 45 to 50% (unchanged)    The pt was last seen by Kathleen Argue in March 2020  Since seen he denies CP   Breathing is OK     He fell in Jan 2021  Was bending over to get something in trunk of car  Stood up and got dizzy  Turned then fel   Hit head requiring sutures.      He has had vertigo in past    He denies problems now      Current Meds  Medication Sig  . Accu-Chek FastClix Lancets MISC CHECK BLOOD SUGAR ONE TIME DAILY AS DIRECTED  . aspirin EC 81 MG tablet Take 81 mg by mouth at bedtime.   . Coenzyme Q10 200 MG capsule Take 200 mg by mouth at bedtime.   . enalapril (VASOTEC) 10 MG tablet TAKE 1 TABLET EVERY DAY  . glucose blood (ACCU-CHEK GUIDE) test strip 1 each by Other route 2 (two) times daily. Use as instructed  . insulin degludec (TRESIBA FLEXTOUCH) 100 UNIT/ML FlexTouch Pen 55 unites per day  . meclizine (ANTIVERT) 25 MG tablet TAKE 1 TABLET THREE TIMES DAILY FOR DIZZINESS  . metoprolol succinate (TOPROL-XL) 25 MG 24 hr tablet Take 2 tablets (50 mg total) by mouth daily. (Patient taking differently: Take 50 mg by mouth at bedtime. )  . nitroGLYCERIN (NITROSTAT) 0.4 MG SL tablet Place 1 tablet (0.4 mg total) under the tongue every 5 (five) minutes as needed. Chest pain (Patient taking  differently: Place 0.4 mg under the tongue every 5 (five) minutes as needed for chest pain. )  . omeprazole (PRILOSEC) 40 MG capsule TAKE 1 CAPSULE EVERY DAY BEFORE A MEAL (Patient taking differently: Take 40 mg by mouth at bedtime. )  . Saw Palmetto, [place]renoa repens, 320 MG CAPS Take 320 mg by mouth at bedtime.   . simvastatin (ZOCOR) 40 MG tablet TAKE 1 TABLET  EVERY EVENING.  [name] triamcinolone cream (KENALOG) 0.1 % Apply 1 application topically 3 (three) times daily. Apply to rash bid x 7-10 days, then prn when rash return     Allergies:   Patient has no known allergies.   Past Medical History:  Diagnosis Date  . Anxiety   . BPH (benign prostatic hyperplasia)   . Cancer ([place]) 2014   neck area  . Colon polyps   . Coronary artery disease   . Depression   . Diverticulosis   . GERD (gastroesophageal reflux disease)   . History of gout    toe  . 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 Hyperlipidemia   . Hypertension    states under control with med., has been on med.  since 2003  . 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 Myocardial infarction ([place]) 2003  . Non-insulin dependent type 2 diabetes mellitus ([place])   . PONV (postoperative nausea and vomiting)   . S/P radiation therapy    Received 8 fractions, then stopped due to mucositis with Radiation/ Cisplatin  . Status post dilation of esophageal narrowing   . Trigger thumb of right hand 01/2016    Past Surgical History:  Procedure Laterality Date  . CARDIAC CATHETERIZATION  05/25/2010  . CARDIOVASCULAR STRESS TEST  07/2009  . COLONOSCOPY WITH PROPOFOL  02/16/2011  . CORONARY ANGIOPLASTY  2003; 2006   2 stents in heart  . CORONARY ARTERY BYPASS GRAFT  2005  . CYSTOSCOPY  02/09/2011   Procedure: CYSTOSCOPY;  Surgeon: Marissa Nestle, MD;  Location: [facility];  Service: Urology;  Laterality: N/A;  . ESOPHAGOGASTRODUODENOSCOPY (EGD) WITH ESOPHAGEAL  DILATION  02/16/2011   with Propofol  . FOOT ARTHRODESIS, SUBTALAR Right 10/13/2009  . MASS EXCISION N/A 03/13/2012   Procedure: EXCISION LEFT BUCCAL MUCOSA;  Surgeon: Rozetta Nunnery, MD;  Location: [facility];  Service: ENT;  Laterality: N/A;  . RADICAL NECK DISSECTION Left 03/13/2012   Procedure: RADICAL NECK DISSECTION;  Surgeon: Rozetta Nunnery, MD;  Location: [facility];  Service: ENT;  Laterality: Left;  . TOOTH EXTRACTION N/A 03/13/2012   Procedure: EXTRACTION MOLARS;  Surgeon: Rozetta Nunnery, MD;  Location: [facility];  Service: ENT;  Laterality: N/A;  . TRIGGER FINGER RELEASE Right 02/24/2016   Procedure: RELEASE TRIGGER FINGER/A-1 PULLEY RIGHT THUMB;  Surgeon: Daryll Brod, MD;  Location: [facility];  Service: Orthopedics;  Laterality: Right;  FAB     Social History:  The patient  reports that he has never smoked. He has never used smokeless tobacco. He reports that he does not drink alcohol or use drugs.   Family History:  The patient's family history includes Asthma in his father; Diabetes in his brother, brother, and sister; Heart disease in his brother; Parkinson's disease in his brother; Seizures in his mother; Stroke in his sister; Thyroid cancer in his daughter.    ROS:  Please see the history of present illness. All other systems are reviewed and  Negative to the above problem except as noted.    PHYSICAL EXAM: VS:  BP 140/78   Pulse (!) 53   Ht 5\' 7"  (1.702 m)   Wt 199 lb 3.2 oz (90.4 kg)   SpO2 98%   BMI 31.20 kg/m    GEN: Well nourished, well developed, in no acute distress  HEENT:  Scar on R forehead healing well   Neck:  JVP is not eleated  No carotid bruits   Cardiac: RRR; no murmurs, rubs, or gallops,no LE edema  Respiratory:  clear to auscultation bilaterally, normal work of breathing GI: soft, nontender, nondistended, + BS  No hepatomegaly  MS: no deformity Moving all extremities   Skin: warm and dry, no rash Neuro:  Strength and sensation  are intact Psych: euthymic mood, full affect   EKG:  EKG is not ordered today.  Cardiac studies  Echo 01/04/2016 EF 45-50, ant-sept AK, Gr 1 DD, trace MR  Event Monitor 12/22/15 Sinus rhythm No arrhythmias detected  Myoview 07/15/15 IMPRESSION: 1. Apical infarct without definite peri-infarct ischemia. 2. Associated apical hypokinesis. 3. Left ventricular ejection fraction 47% 4. Non invasive risk stratification*: Intermediate  Cardiac Catheterization 05/25/10 LM dist  20 LAD prox, mid, dist 30; mid stent patent; Dx mid 60 LCx dist 100 (L-L collats); OM1 (small) 50; OM2 stent patent; OM3 70 at trifurcation of 3 sub-branches,  RCA mid 50; PLB diffuse 40 L-LAD atretic S-OM2 100 EF 55 Med Rx   Lipid Panel    Component Value Date/Time   CHOL 144 10/31/2018 1215   TRIG 125 10/31/2018 1215   HDL 43 10/31/2018 1215   CHOLHDL 3.3 10/31/2018 1215   CHOLHDL 2.6 04/19/2015 0001   VLDL 27 04/19/2015 0001   LDLCALC 79 10/31/2018 1215      Wt Readings from Last 3 Encounters:  04/17/19 199 lb 3.2 oz (90.4 kg)  03/27/19 195 lb 9.6 oz (88.7 kg)  02/27/19 196 lb 12.8 oz (89.3 kg)      ASSESSMENT AND PLAN:  1  CAD  Doing very well   No CP  Breathing is OK   FOllow    2  HTN Controlled  3  HL WIll recheck lipids      Will checlk Lipids, BMET, CBC, Vit D     Plan for f/u in 8 months   Current medicines are reviewed at length with the patient today.  The patient does not have concerns regarding medicines.  Signed, Dorris Carnes, MD  04/17/2019 2:40 PM    [facility] [place], [place], [place]  [address] Phone: [phone]; Fax: [phone]

## 2019-04-16 NOTE — Progress Notes (Signed)
Letter sent to patient to call and schedule New patient appointment with Dr Kelton Pillar.

## 2019-04-17 ENCOUNTER — Other Ambulatory Visit: Payer: Self-pay

## 2019-04-17 ENCOUNTER — Encounter: Payer: Self-pay | Admitting: Internal Medicine

## 2019-04-17 ENCOUNTER — Ambulatory Visit: Payer: Medicare HMO | Admitting: Internal Medicine

## 2019-04-17 VITALS — BP 140/78 | HR 53 | Ht 67.0 in | Wt 199.2 lb

## 2019-04-17 DIAGNOSIS — E782 Mixed hyperlipidemia: Secondary | ICD-10-CM | POA: Diagnosis not present

## 2019-04-17 DIAGNOSIS — M549 Dorsalgia, unspecified: Secondary | ICD-10-CM

## 2019-04-17 DIAGNOSIS — I2510 Atherosclerotic heart disease of native coronary artery without angina pectoris: Secondary | ICD-10-CM

## 2019-04-17 DIAGNOSIS — Z1321 Encounter for screening for nutritional disorder: Secondary | ICD-10-CM | POA: Diagnosis not present

## 2019-04-17 NOTE — Patient Instructions (Signed)
Medication Instructions:  No changes *If you need a refill on your cardiac medications before your next appointment, please call your pharmacy*   Lab Work: Today: lipids, ,vit d, bmet, cbc  If you have labs (blood work) drawn today and your tests are completely normal, you will receive your results only by: [name] MyChart Message (if you have MyChart) OR . A paper copy in the mail If you have any lab test that is abnormal or we need to change your treatment, we will call you to review the results.   Testing/Procedures: none   Follow-Up: At [facility], you and your health needs are our priority.  As part of our continuing mission to provide you with exceptional heart care, we have created designated Provider Care Teams.  These Care Teams include your primary Cardiologist (physician) and Advanced Practice Providers (APPs -  Physician Assistants and Nurse Practitioners) who all work together to provide you with the care you need, when you need it.  Your next appointment:   8 month(s)  The format for your next appointment:   Either In Person or Virtual  Provider:   You may see Dorris Carnes, MD or one of the following Advanced Practice Providers on your designated Care Team:    Richardson Dopp, PA-C  [place], [place]  Daune Perch, NP    Other Instructions You have been referred to Sports Medicine, Dr. Gardenia Phlegm

## 2019-04-18 LAB — CBC
Hematocrit: 40.5 % (ref 37.5–51.0)
Hemoglobin: 13.8 g/dL (ref 13.0–17.7)
MCH: 28.5 pg (ref 26.6–33.0)
MCHC: 34.1 g/dL (ref 31.5–35.7)
MCV: 84 fL (ref 79–97)
Platelets: 186 x10E3/uL (ref 150–450)
RBC: 4.84 x10E6/uL (ref 4.14–5.80)
RDW: 12.6 % (ref 11.6–15.4)
WBC: 5.9 x10E3/uL (ref 3.4–10.8)

## 2019-04-18 LAB — BASIC METABOLIC PANEL
BUN/Creatinine Ratio: 11 (ref 10–24)
BUN: 16 mg/dL (ref 8–27)
CO2: 27 mmol/L (ref 20–29)
Calcium: 9.6 mg/dL (ref 8.6–10.2)
Chloride: 100 mmol/L (ref 96–106)
Creatinine, Ser: 1.43 mg/dL — ABNORMAL HIGH (ref 0.76–1.27)
GFR calc Af Amer: 57 mL/min/{1.73_m2} — ABNORMAL LOW (ref >59)
GFR calc non Af Amer: 50 mL/min/{1.73_m2} — ABNORMAL LOW (ref >59)
Glucose: 228 mg/dL — ABNORMAL HIGH (ref 65–99)
Potassium: 4.7 mmol/L (ref 3.5–5.2)
Sodium: 142 mmol/L (ref 134–144)

## 2019-04-18 LAB — LIPID PANEL
Chol/HDL Ratio: 3.4 ratio (ref 0.0–5.0)
Cholesterol, Total: 128 mg/dL (ref 100–199)
HDL: 38 mg/dL — ABNORMAL LOW (ref >39)
LDL Chol Calc (NIH): 57 mg/dL (ref 0–99)
Triglycerides: 201 mg/dL — ABNORMAL HIGH (ref 0–149)
VLDL Cholesterol Cal: 33 mg/dL (ref 5–40)

## 2019-04-18 LAB — VITAMIN D 25 HYDROXY (VIT D DEFICIENCY, FRACTURES): Vit D, 25-Hydroxy: 76 ng/mL (ref 30.0–100.0)

## 2019-04-29 ENCOUNTER — Telehealth: Payer: Medicare HMO

## 2019-05-15 ENCOUNTER — Telehealth: Payer: Medicare HMO

## 2019-05-15 ENCOUNTER — Other Ambulatory Visit: Payer: Self-pay

## 2019-05-15 ENCOUNTER — Ambulatory Visit (INDEPENDENT_AMBULATORY_CARE_PROVIDER_SITE_OTHER): Payer: Medicare HMO

## 2019-05-15 DIAGNOSIS — E1165 Type 2 diabetes mellitus with hyperglycemia: Secondary | ICD-10-CM | POA: Diagnosis not present

## 2019-05-15 DIAGNOSIS — Z794 Long term (current) use of insulin: Secondary | ICD-10-CM | POA: Diagnosis not present

## 2019-05-15 DIAGNOSIS — E11618 Type 2 diabetes mellitus with other diabetic arthropathy: Secondary | ICD-10-CM

## 2019-05-15 DIAGNOSIS — F325 Major depressive disorder, single episode, in full remission: Secondary | ICD-10-CM

## 2019-05-15 DIAGNOSIS — F5101 Primary insomnia: Secondary | ICD-10-CM

## 2019-05-15 DIAGNOSIS — IMO0002 Reserved for concepts with insufficient information to code with codable children: Secondary | ICD-10-CM

## 2019-05-17 ENCOUNTER — Other Ambulatory Visit: Payer: Self-pay | Admitting: Internal Medicine

## 2019-05-19 NOTE — Patient Instructions (Signed)
Visit Information  Goals Addressed      Patient Stated   . "I would like to talk to you about my diabetes" (pt-stated)       Current Barriers:  [name] Knowledge Deficits related to disease process and self health management for Diabetes . Chronic Disease Management support and education needs related to DMII, Insomnia, Depression   Nurse Case Manager & PharmD Clinical Goal(s):  [name] Over the next 90 days, patient will demonstrate improved adherence to his prescribed treatment plan for Diabetes Management as evidenced by verbalizing understanding and adherence to monitoring CBGs, taking insulin as prescribed and improved adherence to following a Diabetic diet. 12/24/18: Goal date revised to 90 days due to delays in treatment secondary to COVID-19. Goal Met . 04/01/19 New Over the next 45 days, patient will complete a new patient appointment with Endocrinology for further evaluation and treatment of DMII  CCM RN CM Interventions:  05/16/19 call completed with patient  . Evaluation of current treatment plan related to DMII and patient's adherence to plan as established by provider . Determined patient is scheduled to follow up with Dr. Kelton Pillar on 05/23/19 @1 :40 PM . Determined patient increased her Tresiba to 55u qd as recommended by PCP provider . Determined since increasing this medication and increasing his exercise, patient is noticing better glycemic control with reported FBS in the low 100's . Determined patient is also drinking a morning supplement consisting of olive oil, and lemon water . Determined patient needs Rx refill for [name], sent in basket message to PCP provider with refill request . Discussed plans with patient for ongoing care management follow up and provided patient with direct contact information for care management team  . Optimization of DM therapy: o Patient reports throat swelling with Trulicity that resulted in an ED admission.  Will not start GLP1 due to this reported  reaction.  Allergy list updated in the EMR o Metformin d/c'd due to kidney function. 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  Will message PCP  Patient Self Care Activities:  . Self administers medications as prescribed . Attends all scheduled provider appointments . Calls pharmacy for medication refills . Performs ADL's independently . Performs IADL's independently . Calls provider office for new concerns or questions  Please see past updates related to this goal by clicking on the "Past Updates" button in the selected goal        Patient verbalizes understanding of instructions provided today.   Telephone follow up appointment with care management team member scheduled for: 06/13/19  Barb Merino, RN, BSN, CCM Care Management Coordinator [facility]  Direct Phone: [phone]

## 2019-05-19 NOTE — Chronic Care Management (AMB) (Signed)
Chronic Care Management   Follow Up Note   05/16/2019 Name: Gregory Friedman MRN: [id number] DOB: 1950-04-07  Referred by: Shelby Mattocks, PA-C Reason for referral : Chronic Care Management (FU RN DM Call )   Gregory Friedman is a 69 y.o. year old male who is a primary care patient of [place], Sunday Spillers, [place]. The CCM team was consulted for assistance with chronic disease management and care coordination needs.    Review of patient status, including review of consultants reports, relevant laboratory and other test results, and collaboration with appropriate care team members and the patient's provider was performed as part of comprehensive patient evaluation and provision of chronic care management services.    SDOH (Social Determinants of Health) assessments performed: No See Care Plan activities for detailed interventions related to [place])   Placed outbound CCM RN CM call to patient for a DM follow up.     Outpatient Encounter Medications as of 05/15/2019  Medication Sig  . aspirin EC 81 MG tablet Take 81 mg by mouth at bedtime.   . Coenzyme Q10 200 MG capsule Take 200 mg by mouth at bedtime.   . enalapril (VASOTEC) 10 MG tablet TAKE 1 TABLET EVERY DAY  . glucose blood (ACCU-CHEK GUIDE) test strip 1 each by Other route 2 (two) times daily. Use as instructed  . insulin degludec (TRESIBA FLEXTOUCH) 100 UNIT/ML FlexTouch Pen 55 unites per day  . meclizine (ANTIVERT) 25 MG tablet TAKE 1 TABLET THREE TIMES DAILY FOR DIZZINESS  . metoprolol succinate (TOPROL-XL) 25 MG 24 hr tablet Take 2 tablets (50 mg total) by mouth daily. (Patient taking differently: Take 50 mg by mouth at bedtime. )  . nitroGLYCERIN (NITROSTAT) 0.4 MG SL tablet Place 1 tablet (0.4 mg total) under the tongue every 5 (five) minutes as needed. Chest pain (Patient taking differently: Place 0.4 mg under the tongue every 5 (five) minutes as needed for chest pain. )  . omeprazole (PRILOSEC) 40 MG  capsule TAKE 1 CAPSULE EVERY DAY BEFORE A MEAL (Patient taking differently: Take 40 mg by mouth at bedtime. )  . Saw Palmetto, Serenoa repens, 320 MG CAPS Take 320 mg by mouth at bedtime.   . simvastatin (ZOCOR) 40 MG tablet TAKE 1 TABLET  EVERY EVENING.  [name] triamcinolone cream (KENALOG) 0.1 % Apply 1 application topically 3 (three) times daily. Apply to rash bid x 7-10 days, then prn when rash return  . [DISCONTINUED] Accu-Chek FastClix Lancets MISC CHECK BLOOD SUGAR ONE TIME DAILY AS DIRECTED   No facility-administered encounter medications on file as of 05/15/2019.     Objective:  Lab Results  Component Value Date   HGBA1C 9.4 (H) 03/27/2019   HGBA1C 8.5 (H) 12/05/2018   HGBA1C 8.2 (H) 09/12/2018   Lab Results  Component Value Date   MICROALBUR 10 10/31/2018   LDLCALC 57 04/17/2019   CREATININE 1.43 (H) 04/17/2019   BP Readings from Last 3 Encounters:  04/17/19 140/78  03/27/19 132/72  02/27/19 122/74    Goals Addressed      Patient Stated   . "I would like to talk to you about my diabetes" (pt-stated)       Current Barriers:  [name] Knowledge Deficits related to disease process and self health management for Diabetes . Chronic Disease Management support and education needs related to DMII, Insomnia, Depression   Nurse Case Manager & PharmD Clinical Goal(s):  [name] Over the next 90 days, patient will demonstrate improved adherence to his prescribed treatment plan for Diabetes  Management as evidenced by verbalizing understanding and adherence to monitoring CBGs, taking insulin as prescribed and improved adherence to following a Diabetic diet. 12/24/18: Goal date revised to 90 days due to delays in treatment secondary to COVID-19. Goal Met . 04/01/19 New Over the next 45 days, patient will complete a new patient appointment with Endocrinology for further evaluation and treatment of DMII  CCM RN CM Interventions:  05/16/19 call completed with patient  . Evaluation of current treatment  plan related to DMII and patient's adherence to plan as established by provider . Determined patient is scheduled to follow up with Dr. Kelton Pillar on 05/23/19 @1 :40 PM . Determined patient increased her Tresiba to 55u qd as recommended by PCP provider . Determined since increasing this medication and increasing his exercise, patient is noticing better glycemic control with reported FBS in the low 100's . Determined patient is also drinking a morning supplement consisting of olive oil, and lemon water . Determined patient needs Rx refill for [name], sent in basket message to PCP provider with refill request . Discussed plans with patient for ongoing care management follow up and provided patient with direct contact information for care management team  . Optimization of DM therapy: o Patient reports throat swelling with Trulicity that resulted in an ED admission.  Will not start GLP1 due to this reported reaction.  Allergy list updated in the EMR o Metformin d/c'd due to kidney function. 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  Will message PCP  Patient Self Care Activities:  . Self administers medications as prescribed . Attends all scheduled provider appointments . Calls pharmacy for medication refills . Performs ADL's independently . Performs IADL's independently . Calls provider office for new concerns or questions  Please see past updates related to this goal by clicking on the "Past Updates" button in the selected goal        Plan:   Telephone follow up appointment with care management team member scheduled for: 06/04/19  Barb Merino, RN, BSN, CCM Care Management Coordinator [place] [facility]  Direct Phone: [phone]

## 2019-05-20 ENCOUNTER — Other Ambulatory Visit: Payer: Self-pay

## 2019-05-20 MED ORDER — TRESIBA FLEXTOUCH 100 UNIT/ML ~~LOC~~ SOPN: PEN_INJECTOR | SUBCUTANEOUS | 0 refills | Status: DC

## 2019-05-23 ENCOUNTER — Ambulatory Visit: Payer: Medicare HMO | Admitting: Internal Medicine

## 2019-05-23 ENCOUNTER — Other Ambulatory Visit: Payer: Self-pay

## 2019-05-23 ENCOUNTER — Telehealth: Payer: Self-pay | Admitting: Internal Medicine

## 2019-05-23 ENCOUNTER — Encounter: Payer: Self-pay | Admitting: Internal Medicine

## 2019-05-23 VITALS — BP 144/82 | HR 56 | Temp 98.3°F | Ht 67.0 in | Wt 197.6 lb

## 2019-05-23 DIAGNOSIS — Z794 Long term (current) use of insulin: Secondary | ICD-10-CM

## 2019-05-23 DIAGNOSIS — N1831 Chronic kidney disease, stage 3a: Secondary | ICD-10-CM

## 2019-05-23 DIAGNOSIS — E1165 Type 2 diabetes mellitus with hyperglycemia: Secondary | ICD-10-CM

## 2019-05-23 DIAGNOSIS — E1121 Type 2 diabetes mellitus with diabetic nephropathy: Secondary | ICD-10-CM

## 2019-05-23 DIAGNOSIS — E785 Hyperlipidemia, unspecified: Secondary | ICD-10-CM | POA: Diagnosis not present

## 2019-05-23 DIAGNOSIS — E1122 Type 2 diabetes mellitus with diabetic chronic kidney disease: Secondary | ICD-10-CM | POA: Diagnosis not present

## 2019-05-23 DIAGNOSIS — N183 Chronic kidney disease, stage 3 unspecified: Secondary | ICD-10-CM | POA: Diagnosis not present

## 2019-05-23 MED ORDER — INSULIN PEN NEEDLE 32G X 4 MM MISC: 1.0000 | Freq: Four times a day (QID) | 3 refills | Status: DC

## 2019-05-23 MED ORDER — TRESIBA FLEXTOUCH 100 UNIT/ML ~~LOC~~ SOPN: 40.0000 [IU] | PEN_INJECTOR | Freq: Every day | SUBCUTANEOUS | 3 refills | Status: DC

## 2019-05-23 MED ORDER — NOVOLOG FLEXPEN 100 UNIT/ML ~~LOC~~ SOPN: 8.0000 [IU] | PEN_INJECTOR | Freq: Three times a day (TID) | SUBCUTANEOUS | 3 refills | Status: DC

## 2019-05-23 NOTE — Progress Notes (Signed)
Name: Gregory Friedman  MRN/ DOB: [id number], 1950-08-24   Age/ Sex: 69 y.o., male    PCP: Rodriguez-[facility], Sandrea Matte   Reason for Endocrinology Evaluation: Type 2 Diabetes Mellitus     Date of Initial Endocrinology Visit: 05/23/2019     PATIENT IDENTIFIER: Gregory Friedman is a 69 y.o. male with a past medical history of HTN, DM and dyslipidemia . The patient presented for initial endocrinology clinic visit on 05/23/2019 for consultative assistance with his diabetes management.    HPI: Gregory Friedman was    Diagnosed with DM in 2003 Prior Medications tried/Intolerance: Metformin- due to low GFR , Glyburide- hypoglycemia . Has been on insulin since 2020. Jardiance - yeast infections . Kombyzlye - persistent hyperglycemia . GLP-1 agonist- Nausea and vomiting  Currently checking blood sugars 1 x / day Hypoglycemia episodes : no               Symptoms: no              Hemoglobin A1c has ranged from 7.2% in 2017, peaking at 10.2% in 2020. Patient required assistance for hypoglycemia: no  Patient has required hospitalization within the last 1 year from hyper or hypoglycemia: 2020  In terms of diet, the patient eats 2 meals a day, snacks in the middle of the day    HOME DIABETES REGIMEN: Tresiba 55 units daily    Statin: yes ACE-I/ARB: yes Prior Diabetic Education: yes    GLUCOSE LOG:  89- 126 mg/dL      DIABETIC COMPLICATIONS: Microvascular complications:  CKD III Denies: retinopathy , neuropathy  Last eye exam: Completed 2019- has pending appointment   Macrovascular complications:  CAD (S/P CABG)  Denies: PVD, CVA   PAST HISTORY: Past Medical History:  Past Medical History:  Diagnosis Date  . Anxiety   . BPH (benign prostatic hyperplasia)   . Cancer ([place]) 2014   neck area  . Colon polyps   . Coronary artery disease   . Depression   . Diverticulosis   . GERD (gastroesophageal reflux disease)   . History of gout    toe  . 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 Hyperlipidemia   . Hypertension    states under control with med., has been on med. since 2003  . 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 Myocardial infarction ([place]) 2003  . Non-insulin dependent type 2 diabetes mellitus ([place])   . PONV (postoperative nausea and vomiting)   . S/P radiation therapy    Received 8 fractions, then stopped due to mucositis with Radiation/ Cisplatin  . Status post dilation of esophageal narrowing   . Trigger thumb of right hand 01/2016   Past Surgical History:  Past Surgical History:  Procedure Laterality Date  . CARDIAC CATHETERIZATION  05/25/2010  . CARDIOVASCULAR STRESS TEST  07/2009  . COLONOSCOPY WITH PROPOFOL  02/16/2011  . CORONARY ANGIOPLASTY  2003; 2006   2 stents in heart  . CORONARY ARTERY BYPASS GRAFT  2005  . CYSTOSCOPY  02/09/2011   Procedure: CYSTOSCOPY;  Surgeon: Marissa Nestle, MD;  Location: [facility];  Service: Urology;  Laterality: N/A;  . ESOPHAGOGASTRODUODENOSCOPY (EGD) WITH ESOPHAGEAL DILATION  02/16/2011   with Propofol  . FOOT ARTHRODESIS, SUBTALAR Right 10/13/2009  . MASS EXCISION N/A 03/13/2012   Procedure: EXCISION LEFT BUCCAL MUCOSA;  Surgeon: Rozetta Nunnery, MD;  Location: [facility]  OR;  Service: ENT;  Laterality: N/A;  . RADICAL NECK DISSECTION Left 03/13/2012   Procedure: RADICAL NECK DISSECTION;  Surgeon: Rozetta Nunnery, MD;  Location: [facility];  Service: ENT;  Laterality: Left;  . TOOTH EXTRACTION N/A 03/13/2012   Procedure: EXTRACTION MOLARS;  Surgeon: Rozetta Nunnery, MD;  Location: [facility];  Service: ENT;  Laterality: N/A;  . TRIGGER FINGER RELEASE Right 02/24/2016   Procedure: RELEASE TRIGGER FINGER/A-1 PULLEY RIGHT THUMB;  Surgeon: Daryll Brod, MD;  Location: [facility];  Service: Orthopedics;  Laterality: Right;  FAB    Social History:  reports that he has never smoked. He has never used  smokeless tobacco. He reports that he does not drink alcohol or use drugs. Family History:  Family History  Problem Relation Age of Onset  . Stroke Sister   . Diabetes Brother   . Seizures Mother   . Asthma Father   . Thyroid cancer Daughter   . Diabetes Sister   . Parkinson's disease Brother   . Heart disease Brother   . Diabetes Brother   . Colon cancer Neg Hx      HOME MEDICATIONS: Allergies as of 05/23/2019   No Known Allergies     Medication List       Accurate as of May 23, 2019  2:09 PM. If you have any questions, ask your nurse or doctor.        Accu-Chek Softclix Lancets lancets USE AS DIRECTED   aspirin EC 81 MG tablet Take 81 mg by mouth at bedtime.   Coenzyme Q10 200 MG capsule Take 200 mg by mouth at bedtime.   enalapril 10 MG tablet Commonly known as: VASOTEC TAKE 1 TABLET EVERY DAY   glucose blood test strip Commonly known as: Accu-Chek Guide 1 each by Other route 2 (two) times daily. Use as instructed   meclizine 25 MG tablet Commonly known as: ANTIVERT TAKE 1 TABLET THREE TIMES DAILY FOR DIZZINESS   metoprolol succinate 25 MG 24 hr tablet Commonly known as: TOPROL-XL Take 2 tablets (50 mg total) by mouth daily. What changed: when to take this   nitroGLYCERIN 0.4 MG SL tablet Commonly known as: NITROSTAT Place 1 tablet (0.4 mg total) under the tongue every 5 (five) minutes as needed. Chest pain What changed:  reasons to take this additional instructions   omeprazole 40 MG capsule Commonly known as: PRILOSEC TAKE 1 CAPSULE EVERY DAY BEFORE A MEAL What changed: See the new instructions.   Saw Palmetto (Serenoa repens) 320 MG Caps Take 320 mg by mouth at bedtime.   simvastatin 40 MG tablet Commonly known as: ZOCOR TAKE 1 TABLET  EVERY EVENING.   [name] FlexTouch 100 UNIT/ML FlexTouch Pen Generic drug: insulin degludec 55 unites per day   triamcinolone cream 0.1 % Commonly known as: KENALOG Apply 1 application topically 3  (three) times daily. Apply to rash bid x 7-10 days, then prn when rash return        ALLERGIES: No Known Allergies   REVIEW OF SYSTEMS: A comprehensive ROS was conducted with the patient and is negative except as per HPI and below:  Review of Systems  Gastrointestinal: Negative for diarrhea and nausea.  Neurological: Positive for tingling.      OBJECTIVE:   VITAL SIGNS: BP (!) 144/82 (BP Location: Left Arm, Patient Position: Sitting, Cuff Size: Normal)   Pulse (!) 56   Temp 98.3 F (36.8 C)   Ht 5\' 7"  (1.702 m)   Wt  197 lb 9.6 oz (89.6 kg)   SpO2 96%   BMI 30.95 kg/m    PHYSICAL EXAM:  General: Pt appears well and is in NAD  HEENT:  Eyes: External eye exam normal without stare, lid lag or exophthalmos.  EOM intact.   Neck: General: Supple without adenopathy or carotid bruits. Thyroid: Thyroid size normal.  No goiter or nodules appreciated. No thyroid bruit.  Lungs: Clear with good BS bilat with no rales, rhonchi, or wheezes  Heart: RRR with normal S1 and S2 and no gallops; no murmurs; no rub  Abdomen: Normoactive bowel sounds, soft, nontender, without masses or organomegaly palpable  Extremities:  Lower extremities - No pretibial edema.  Skin:   Neuro: MS is good with appropriate affect, pt is alert and Ox3     DATA REVIEWED:  Lab Results  Component Value Date   HGBA1C 9.4 (H) 03/27/2019   HGBA1C 8.5 (H) 12/05/2018   HGBA1C 8.2 (H) 09/12/2018   Lab Results  Component Value Date   MICROALBUR 10 10/31/2018   LDLCALC 57 04/17/2019   CREATININE 1.43 (H) 04/17/2019   Lab Results  Component Value Date   MICRALBCREAT 30 10/31/2018    Lab Results  Component Value Date   CHOL 128 04/17/2019   HDL 38 (L) 04/17/2019   LDLCALC 57 04/17/2019   TRIG 201 (H) 04/17/2019   CHOLHDL 3.4 04/17/2019        ASSESSMENT / PLAN / RECOMMENDATIONS:   1) Type 2 Diabetes Mellitus, Poorly controlled, With CKD III complications - Most recent A1c of 9.4 %. Goal A1c < 7.0  %.    Plan: GENERAL: I have discussed with the patient the pathophysiology of diabetes. We went over the natural progression of the disease. We talked about both insulin resistance and insulin deficiency. We stressed the importance of lifestyle changes including diet and exercise. I explained the complications associated with diabetes including retinopathy, nephropathy, neuropathy as well as increased risk of cardiovascular disease. We went over the benefit seen with glycemic control.   I explained to the patient that diabetic patients are at higher than normal risk for amputations.  We discussed the reason for his elevated A1c is post-prandial hyperglycemia and basal insulin is not going to improve that.  Discussed pharmacokinetics of basal/bolus insulin We also discussed avoiding sugar-sweetened beverages and snacks, when possible.  Pt is reluctant to try GLP-1 agonists due to severe nausea and vomiting in the past requiring hospitalization We discussed the limitation of glycemic agents given his CKD We discussed prandial insulin and the importance of taking it with meals ONLY  MEDICATIONS: - Decrease Tresiba to 40 units daily  - Start Novolog 8 units with each meal    EDUCATION / INSTRUCTIONS: BG monitoring instructions: Patient is instructed to check his blood sugars 3 times a day, before meals and bedtime .( pt eats 2 meals a day ) Call [place] [facility] clinic if: BG persistently < 70 or > 300. I reviewed the Rule of 15 for the treatment of hypoglycemia in detail with the patient. Literature supplied.   2) Diabetic complications:  Eye: Does not have known diabetic retinopathy.  Neuro/ Feet: Does not  have known diabetic peripheral neuropathy. Renal: Patient does have known baseline CKD. He is on an ACEI/ARB at present.   3) Lipids: Patient is on Simvastatin 40 mg daily. LDL at goal . Discussed cardiovascular benefits          Signed electronically by: Mack Guise, MD  [facility] Endocrinology  [facility] [address] [place], [place] [address] Phone: [phone] FAX: [phone]   CC: Shelby Mattocks, Hershal Coria [address] [place] [address] Phone: [phone]  Fax: [phone]    Return to Endocrinology clinic as below: Future Appointments  Date Time Provider [place]  06/04/2019  2:15 PM [name]-CCM CASE MANAGER [name] None  06/06/2019 11:00 AM [name]-CCM PHARMACIST [name] None  06/11/2019  2:15 PM Lyndal Pulley, DO [facility] None  06/26/2019  3:30 PM Rodriguez-Southworth, Sunday Spillers, PA-C [name] None  09/18/2019  2:00 PM Rodriguez-Southworth, Sunday Spillers, PA-C [name] None  09/18/2019  2:30 PM [name]-THN [name] None

## 2019-05-23 NOTE — Patient Instructions (Signed)
-   Decrease Tresiba to 40 units daily  - Start Novolog 8 units with each meal     - Check sugar before meals and bedtime     HOW TO TREAT LOW BLOOD SUGARS (Blood sugar LESS THAN 70 MG/DL)  Please follow the RULE OF 15 for the treatment of hypoglycemia treatment (when your (blood sugars are less than 70 mg/dL)    STEP 1: Take 15 grams of carbohydrates when your blood sugar is low, which includes:   3-4 GLUCOSE TABS  OR  3-4 OZ OF JUICE OR REGULAR SODA OR  ONE TUBE OF GLUCOSE GEL     STEP 2: RECHECK blood sugar in 15 MINUTES STEP 3: If your blood sugar is still low at the 15 minute recheck --> then, go back to STEP 1 and treat AGAIN with another 15 grams of carbohydrates.

## 2019-05-23 NOTE — Chronic Care Management (AMB) (Signed)
  Chronic Care Management   Note  05/23/2019 Name: Gemini Blanchett MRN: [id number] DOB: Jul 30, 1950  Damyon Dealba is a 69 y.o. year old male who is a primary care patient of Rodriguez-Southworth, Sandrea Matte and is actively engaged with the care management team. I reached out to [name] by phone today to assist with scheduling an initial visit with the Pharmacist.  Follow up plan: Telephone appointment with care management team member scheduled for: 06/06/2019  [place]  [place], [place] [address] Direct Dial: [email or web address] Website: [email or web address]

## 2019-06-04 ENCOUNTER — Ambulatory Visit: Payer: Self-pay

## 2019-06-04 ENCOUNTER — Other Ambulatory Visit: Payer: Self-pay

## 2019-06-04 ENCOUNTER — Telehealth: Payer: Medicare HMO

## 2019-06-04 DIAGNOSIS — E785 Hyperlipidemia, unspecified: Secondary | ICD-10-CM

## 2019-06-04 DIAGNOSIS — Z794 Long term (current) use of insulin: Secondary | ICD-10-CM

## 2019-06-04 DIAGNOSIS — F325 Major depressive disorder, single episode, in full remission: Secondary | ICD-10-CM

## 2019-06-05 NOTE — Chronic Care Management (AMB) (Signed)
  Chronic Care Management   Outreach Note  06/05/2019 Name: Gregory Friedman MRN: [id number] DOB: [birth year]  Referred by: Shelby Mattocks, PA-C Reason for referral : Chronic Care Management (FU RN Call - DM/Endo check in)   An unsuccessful telephone outreach was attempted today. The patient was referred to the case management team for assistance with care management and care coordination.   Follow Up Plan: Telephone follow up appointment with care management team member scheduled for: 07/01/19  Barb Merino, RN, BSN, CCM Care Management Coordinator [facility] [facility]  Direct Phone: [phone]

## 2019-06-06 ENCOUNTER — Telehealth: Payer: Medicare HMO

## 2019-06-06 NOTE — Chronic Care Management (AMB) (Deleted)
Chronic Care Management Pharmacy  Name: Gregory Friedman  MRN: [id number] DOB: 1950/06/19  Chief Complaint/ HPI  Gregory Friedman,  69 y.o. , male presents for their Initial CCM visit with the clinical pharmacist via telephone due to COVID-19 Pandemic.  PCP : Shelby Mattocks, PA-C  Their chronic conditions include: Hypertension, CAD, GERD, Hypothyroidism, Diabetes, mixed hyperlipidemia  Office Visits: 03/27/19 OV: Started on triamcinolone for psoriasis. Labs ordered (Hgb A1c). Tresiba increased to 55 units due to elevated/ worsening A1c.    Consult Visits: 05/23/19 Endocrinology OV w/ Dr. Kelton Pillar: Presented for initial diabetes evaluation after referral from PCP. Source of elevated A1c determined to be post prandial hyperglycemia. Pt is reluctant to try GLP-1 agonists due to severe N/V in the past requiring hospitalization. Decreased Tresiba to 40 units daily and added Novolog 8 units with each meal. Pt instructed to check BG three times daily (before his 2 meals and at bedtime).   04/17/19 Cardiology OV w/ Dr. Harrington Challenger: Presented for follow up of CAD. History of CAD s/p PCI and CABG (2005 in [place]). No chest pain reported. Breathing is okay. [place] January 2021 due to dizziness and required sutures (history of vertigo). Labs ordered. Follow up in 8 months.   CCM Encounters: 05/15/19 RN: Coordinated refills for [name] with PCP. Pt reports improved BG (low 100s) since increasing Tresiba to 55 units daily and increasing exercise. (Swelling of throat with Trulicity that reqiured ED admission***)  04/07/19 PharmD: Comprehensive medication review performed   Medications: Outpatient Encounter Medications as of 06/06/2019  Medication Sig  . Accu-Chek Softclix Lancets lancets USE AS DIRECTED  . aspirin EC 81 MG tablet Take 81 mg by mouth at bedtime.   . Coenzyme Q10 200 MG capsule Take 200 mg by mouth at bedtime.   . enalapril (VASOTEC) 10 MG tablet TAKE 1 TABLET EVERY DAY   . glucose blood (ACCU-CHEK GUIDE) test strip 1 each by Other route 2 (two) times daily. Use as instructed  . insulin aspart (NOVOLOG FLEXPEN) 100 UNIT/ML FlexPen Inject 8 Units into the skin 3 (three) times daily with meals.  . insulin degludec (TRESIBA FLEXTOUCH) 100 UNIT/ML FlexTouch Pen Inject 0.4 mLs (40 Units total) into the skin daily. 55 unites per day  . Insulin Pen Needle 32G X 4 MM MISC 1 Device by Does not apply route in the morning, at noon, in the evening, and at bedtime.  . meclizine (ANTIVERT) 25 MG tablet TAKE 1 TABLET THREE TIMES DAILY FOR DIZZINESS  . metoprolol succinate (TOPROL-XL) 25 MG 24 hr tablet Take 2 tablets (50 mg total) by mouth daily. (Patient taking differently: Take 50 mg by mouth at bedtime. )  . nitroGLYCERIN (NITROSTAT) 0.4 MG SL tablet Place 1 tablet (0.4 mg total) under the tongue every 5 (five) minutes as needed. Chest pain (Patient taking differently: Place 0.4 mg under the tongue every 5 (five) minutes as needed for chest pain. )  . omeprazole (PRILOSEC) 40 MG capsule TAKE 1 CAPSULE EVERY DAY BEFORE A MEAL (Patient taking differently: Take 40 mg by mouth at bedtime. )  . Saw Palmetto, Serenoa repens, 320 MG CAPS Take 320 mg by mouth at bedtime.   . simvastatin (ZOCOR) 40 MG tablet TAKE 1 TABLET  EVERY EVENING.  [name] triamcinolone cream (KENALOG) 0.1 % Apply 1 application topically 3 (three) times daily. Apply to rash bid x 7-10 days, then prn when rash return   No facility-administered encounter medications on file as of 06/06/2019.     Current Diagnosis/Assessment:  Goals Addressed   None     Diabetes   Recent Relevant Labs: Lab Results  Component Value Date/Time   HGBA1C 9.4 (H) 03/27/2019 05:25 PM   HGBA1C 8.5 (H) 12/05/2018 04:39 PM   MICROALBUR 10 10/31/2018 10:57 AM   MICROALBUR 1.0 04/19/2015 12:01 AM   MICROALBUR 0.2 05/14/2014 12:01 AM   Kidney Function Lab Results  Component Value Date/Time   CREATININE 1.43 (H) 04/17/2019 03:17 PM    CREATININE 1.52 (H) 02/10/2019 05:30 PM   CREATININE 1.41 (H) 04/19/2015 12:01 AM   CREATININE 1.35 05/14/2014 12:01 AM   CREATININE 1.4 (H) 05/20/2013 10:58 AM   CREATININE 1.3 11/14/2012 09:19 AM   GFRNONAA 50 (L) 04/17/2019 03:17 PM   GFRAA 57 (L) 04/17/2019 03:17 PM  Stage 3a CKD  Checking BG: {CHL HP Blood Glucose Monitoring [phone]}  Recent FBG Readings: Recent pre-meal BG readings: *** Recent 2hr PP BG readings:  *** Recent HS BG readings: *** Patient has failed these meds in past: Jardiance (yeast infections), alogliptin, metformin (low GFR), Kombiglyze (persistent hyperglycemia), glyburide (hypoglycemia), GLP-1 agonist (nausea and vomiting) Patient is currently {CHL Controlled/Uncontrolled:[phone]} on the following medications:  -Tresiba 55 units daily -Novolog 8 units three times daily with meals  Last diabetic Foot exam:  Lab Results  Component Value Date/Time   HMDIABEYEEXA No Retinopathy 09/14/2015 12:00 AM    Last diabetic Eye exam: No results found for: HMDIABFOOTEX   We discussed: {CHL HP Upstream Pharmacy [phone]}  Plan  Continue {CHL HP Upstream Pharmacy Plans:9311224292}    Hypertension   Office blood pressures are  BP Readings from Last 3 Encounters:  05/23/19 (!) 144/82  04/17/19 140/78  03/27/19 132/72   Patient has failed these meds in the past: Amlodipine, diltiazem, isosorbide mononitrate Patient is currently {CHL Controlled/Uncontrolled:[phone]} on the following medications:  -Enalapril 10mg  daily -Metoprolol succinate 25mg  2 tablets daily  Patient checks BP at home {CHL HP BP Monitoring Frequency:[phone]}  Patient home BP readings are ranging: ***  We discussed {CHL HP Upstream Pharmacy [phone]}  Plan  Continue {CHL HP Upstream Pharmacy Plans:9311224292}   Hyperlipidemia   Lipid Panel     Component Value Date/Time   CHOL 128 04/17/2019 1517   TRIG 201 (H) 04/17/2019 1517   HDL  38 (L) 04/17/2019 1517   CHOLHDL 3.4 04/17/2019 1517   CHOLHDL 2.6 04/19/2015 0001   VLDL 27 04/19/2015 0001   LDLCALC 57 04/17/2019 1517   LABVLDL 33 04/17/2019 1517     The ASCVD Risk score (Goff DC Jr., et al., 2013) failed to calculate for the following reasons:   The valid total cholesterol range is 130 to 320 mg/dL   Patient has failed these meds in past: *** Patient is currently {CHL Controlled/Uncontrolled:[phone]} on the following medications:  -Simvastatin 40mg  every evening -Coenzyme Q10 200mg  daily at bedtime -Aspirin 81mg  daily  We discussed:  {CHL HP Upstream Pharmacy [phone]}  Plan  Continue {CHL HP Upstream Pharmacy Plans:9311224292}  Hypothyroidism   TSH  Date Value Ref Range Status  10/31/2018 4.080 0.450 - 4.500 uIU/mL Final     Patient has failed these meds in past: Levothyroxine Patient is currently {CHL Controlled/Uncontrolled:[phone]} on the following medications: ***  We discussed:  {CHL HP Upstream Pharmacy [phone]}  Plan  Continue {CHL HP Upstream Pharmacy Plans:[id number]}   GERD   Patient has failed these meds in past: *** Patient is currently {CHL Controlled/Uncontrolled:[phone]} on the following medications:  -Omeprazole 40mg  capsule  We discussed:  {CHL HP Upstream Pharmacy [phone]}  Plan  Continue {CHL HP Upstream Pharmacy Plans:[phone]}   MISC -Saw Palmetto 320mg  daily -Nitroglycerin SL -Traimcinolone 0.1mg  cream three times daily as needed   Vaccines   Reviewed and discussed patient's vaccination history.    Immunization History  Administered Date(s) Administered  . Influenza Split 11/24/2010, 12/06/2011  . Influenza, High Dose Seasonal PF 11/17/2014, 10/23/2017, 12/05/2018  . Influenza,inj,Quad PF,6+ Mos 11/25/2012, 01/01/2014  . Pneumococcal Conjugate-13 01/07/2015  . Pneumococcal Polysaccharide-23 10/21/2009  . Tdap 05/27/2009    Plan  Recommended  patient receive *** vaccine in *** office/pharmacy.   Medication Management  ***Disadvantaged  Pt uses [facility] for all medications Uses pill box? {Yes or If no, why not?:20788} Pt endorses ***% compliance  We discussed: ***  Plan  {US Pharmacy IK:[id number]    Follow up: *** month phone visit  ***

## 2019-06-10 ENCOUNTER — Other Ambulatory Visit: Payer: Self-pay

## 2019-06-10 ENCOUNTER — Telehealth: Payer: Medicare HMO

## 2019-06-10 ENCOUNTER — Ambulatory Visit: Payer: Self-pay

## 2019-06-10 DIAGNOSIS — H2513 Age-related nuclear cataract, bilateral: Secondary | ICD-10-CM | POA: Diagnosis not present

## 2019-06-10 DIAGNOSIS — H40013 Open angle with borderline findings, low risk, bilateral: Secondary | ICD-10-CM | POA: Diagnosis not present

## 2019-06-10 DIAGNOSIS — E785 Hyperlipidemia, unspecified: Secondary | ICD-10-CM | POA: Diagnosis not present

## 2019-06-10 DIAGNOSIS — F325 Major depressive disorder, single episode, in full remission: Secondary | ICD-10-CM | POA: Diagnosis not present

## 2019-06-10 DIAGNOSIS — E1165 Type 2 diabetes mellitus with hyperglycemia: Secondary | ICD-10-CM | POA: Diagnosis not present

## 2019-06-10 DIAGNOSIS — E113293 Type 2 diabetes mellitus with mild nonproliferative diabetic retinopathy without macular edema, bilateral: Secondary | ICD-10-CM | POA: Diagnosis not present

## 2019-06-10 DIAGNOSIS — Z794 Long term (current) use of insulin: Secondary | ICD-10-CM | POA: Diagnosis not present

## 2019-06-10 DIAGNOSIS — H43813 Vitreous degeneration, bilateral: Secondary | ICD-10-CM | POA: Diagnosis not present

## 2019-06-10 DIAGNOSIS — F5101 Primary insomnia: Secondary | ICD-10-CM

## 2019-06-11 ENCOUNTER — Ambulatory Visit: Payer: Self-pay

## 2019-06-11 ENCOUNTER — Ambulatory Visit (INDEPENDENT_AMBULATORY_CARE_PROVIDER_SITE_OTHER): Payer: Medicare HMO | Admitting: Family Medicine

## 2019-06-11 ENCOUNTER — Encounter: Payer: Self-pay | Admitting: Family Medicine

## 2019-06-11 ENCOUNTER — Other Ambulatory Visit: Payer: Self-pay | Admitting: Family Medicine

## 2019-06-11 ENCOUNTER — Ambulatory Visit (INDEPENDENT_AMBULATORY_CARE_PROVIDER_SITE_OTHER)
Admission: RE | Admit: 2019-06-11 | Discharge: 2019-06-11 | Disposition: A | Discharge status: Home / Self Care | Payer: Medicare HMO | Source: Ambulatory Visit | Attending: Family Medicine | Admitting: Family Medicine

## 2019-06-11 VITALS — BP 140/90 | HR 66 | Ht 67.0 in | Wt 197.0 lb

## 2019-06-11 DIAGNOSIS — M5134 Other intervertebral disc degeneration, thoracic region: Secondary | ICD-10-CM | POA: Diagnosis not present

## 2019-06-11 DIAGNOSIS — M545 Low back pain, unspecified: Secondary | ICD-10-CM

## 2019-06-11 DIAGNOSIS — G8929 Other chronic pain: Secondary | ICD-10-CM

## 2019-06-11 DIAGNOSIS — M546 Pain in thoracic spine: Secondary | ICD-10-CM

## 2019-06-11 MED ORDER — GABAPENTIN 100 MG PO CAPS: 200.0000 mg | ORAL_CAPSULE | Freq: Every day | ORAL | 0 refills | Status: DC

## 2019-06-11 MED ORDER — MELOXICAM 15 MG PO TABS: 15.0000 mg | ORAL_TABLET | Freq: Every day | ORAL | 0 refills | Status: DC

## 2019-06-11 NOTE — Assessment & Plan Note (Signed)
Low back pain but significant tightness noted.  Started gabapentin 200 mg at night.  Patient given a short course of meloxicam.  Patient has had elevated creatinine and I would like him to avoid doing this long-term but do feel that inflammation is contributing to the overall at the moment.  We could have done prednisone but secondary to diabetes and increasing A1c will do a short course of anti-inflammatory with a GFR greater than 50 still.  Patient will do icing regimen and home exercises.  Declined formal physical therapy at this time but will consider it at follow-up.  X-rays pending at the moment.  Does seem to be more of a tightness of the hip flexors than anything else at the moment.  Follow-up again in 4 to 6 weeks

## 2019-06-11 NOTE — Chronic Care Management (AMB) (Signed)
Erroneous Encounter

## 2019-06-11 NOTE — Patient Instructions (Addendum)
Meloxicam Gabapentin 200mg  Tart cherry 1200 mg at night Vit D 2000IU daily Thoracic Lumbar See me in 4-5 weeks

## 2019-06-11 NOTE — Progress Notes (Signed)
[facility] [address] [place] Phone: [phone] Subjective:   I Gregory Friedman am serving as a Education administrator for Dr. Hulan Saas.  This visit occurred during the SARS-CoV-2 public health emergency.  Safety protocols were in place, including screening questions prior to the visit, additional usage of staff PPE, and extensive cleaning of exam room while observing appropriate contact time as indicated for disinfecting solutions.   I'm seeing this patient by the request  of:  Rodriguez-Southworth, Sunday Spillers, PA-C  CC: Low back and mid back pain  [id number]  Gregory Friedman is a 69 y.o. male coming in with complaint of back pain.   Onset- Chronic  Location - Bilateral pain from the shoulder blades down  Duration- worse at night  Character- achy  Aggravating factors- sleeping, turning over Reliving factors-nothing specific except anti-inflammatory such as Aleve Therapies tried-  Severity- 5/10 at worse     Patient states in his younger years has had multiple injuries.  Never had surgery.  Past Medical History:  Diagnosis Date  . Anxiety   . BPH (benign prostatic hyperplasia)   . Cancer ([place]) 2014   neck area  . Colon polyps   . Coronary artery disease   . Depression   . Diverticulosis   . GERD (gastroesophageal reflux disease)   . History of gout    toe  . 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 Hyperlipidemia   . Hypertension    states under control with med., has been on med. since 2003  . 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 Myocardial infarction ([place] [facility]) 2003  . Non-insulin dependent type 2 diabetes mellitus ([facility])   . PONV (postoperative nausea and vomiting)   . S/P radiation therapy    Received 8 fractions, then stopped due to mucositis with Radiation/ Cisplatin  . Status post dilation of esophageal narrowing   . Trigger thumb of  right hand 01/2016   Past Surgical History:  Procedure Laterality Date  . CARDIAC CATHETERIZATION  05/25/2010  . CARDIOVASCULAR STRESS TEST  07/2009  . COLONOSCOPY WITH PROPOFOL  02/16/2011  . CORONARY ANGIOPLASTY  2003; 2006   2 stents in heart  . CORONARY ARTERY BYPASS GRAFT  2005  . CYSTOSCOPY  02/09/2011   Procedure: CYSTOSCOPY;  Surgeon: Marissa Nestle, MD;  Location: [facility];  Service: Urology;  Laterality: N/A;  . ESOPHAGOGASTRODUODENOSCOPY (EGD) WITH ESOPHAGEAL DILATION  02/16/2011   with Propofol  . FOOT ARTHRODESIS, SUBTALAR Right 10/13/2009  . MASS EXCISION N/A 03/13/2012   Procedure: EXCISION LEFT BUCCAL MUCOSA;  Surgeon: Rozetta Nunnery, MD;  Location: [facility];  Service: ENT;  Laterality: N/A;  . RADICAL NECK DISSECTION Left 03/13/2012   Procedure: RADICAL NECK DISSECTION;  Surgeon: Rozetta Nunnery, MD;  Location: [facility];  Service: ENT;  Laterality: Left;  . TOOTH EXTRACTION N/A 03/13/2012   Procedure: EXTRACTION MOLARS;  Surgeon: Rozetta Nunnery, MD;  Location: [facility];  Service: ENT;  Laterality: N/A;  . TRIGGER FINGER RELEASE Right 02/24/2016   Procedure: RELEASE TRIGGER FINGER/A-1 PULLEY RIGHT THUMB;  Surgeon: Daryll Brod, MD;  Location: [facility];  Service: Orthopedics;  Laterality: Right;  FAB   Social History   Socioeconomic History  . Marital status: Married    Spouse name: Not on file  . Number of children: 3  . Years of education: Not  on file  . Highest education level: Not on file  Occupational History  . 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 Smoking status: Never Smoker  . Smokeless tobacco: Never Used  Substance and Sexual Activity  . Alcohol use: No  . Drug use: No  . Sexual activity: Yes  Other Topics Concern  . Not on file  Social History Narrative   Married;  2 children in [place], son in [place]; 6 grandchildren      Social Determinants of Health   Financial Resource  Strain: Medium Risk  . Difficulty of Paying Living Expenses: Somewhat hard  Food Insecurity:   . Worried About Charity fundraiser in the Last Year:   . Arboriculturist in the Last Year:   Transportation Needs:   . Film/video editor (Medical):   [name] Lack of Transportation (Non-Medical):   Physical Activity:   . Days of Exercise per Week:   . Minutes of Exercise per Session:   Stress:   . Feeling of Stress :   Social Connections:   . Frequency of Communication with Friends and Family:   . Frequency of Social Gatherings with Friends and Family:   . Attends Religious Services:   . Active Member of Clubs or Organizations:   . Attends Archivist Meetings:   [name] Marital Status:    No Known Allergies Family History  Problem Relation Age of Onset  . Stroke Sister   . Diabetes Brother   . Seizures Mother   . Asthma Father   . Thyroid cancer Daughter   . Diabetes Sister   . Parkinson's disease Brother   . Heart disease Brother   . Diabetes Brother   . Colon cancer Neg Hx     Current Outpatient Medications (Endocrine & Metabolic):  .  insulin aspart (NOVOLOG FLEXPEN) 100 UNIT/ML FlexPen, Inject 8 Units into the skin 3 (three) times daily with meals. .  insulin degludec (TRESIBA FLEXTOUCH) 100 UNIT/ML FlexTouch Pen, Inject 0.4 mLs (40 Units total) into the skin daily. 55 unites per day  Current Outpatient Medications (Cardiovascular):  .  enalapril (VASOTEC) 10 MG tablet, TAKE 1 TABLET EVERY DAY .  metoprolol succinate (TOPROL-XL) 25 MG 24 hr tablet, Take 2 tablets (50 mg total) by mouth daily. (Patient taking differently: Take 50 mg by mouth at bedtime. ) .  nitroGLYCERIN (NITROSTAT) 0.4 MG SL tablet, Place 1 tablet (0.4 mg total) under the tongue every 5 (five) minutes as needed. Chest pain (Patient taking differently: Place 0.4 mg under the tongue every 5 (five) minutes as needed for chest pain. ) .  simvastatin (ZOCOR) 40 MG tablet, TAKE 1 TABLET  EVERY  EVENING.   Current Outpatient Medications (Analgesics):  .  aspirin EC 81 MG tablet, Take 81 mg by mouth at bedtime.  .  meloxicam (MOBIC) 15 MG tablet, Take 1 tablet (15 mg total) by mouth daily.   Current Outpatient Medications (Other):  [name]  Accu-Chek Softclix Lancets lancets, USE AS DIRECTED .  Coenzyme Q10 200 MG capsule, Take 200 mg by mouth at bedtime.  [name]  glucose blood (ACCU-CHEK GUIDE) test strip, 1 each by Other route 2 (two) times daily. Use as instructed .  Insulin Pen Needle 32G X 4 MM MISC, 1 Device by Does not apply route in the morning, at noon, in the evening, and at bedtime. .  meclizine (ANTIVERT) 25 MG tablet, TAKE 1  TABLET THREE TIMES DAILY FOR DIZZINESS .  omeprazole (PRILOSEC) 40 MG capsule, TAKE 1 CAPSULE EVERY DAY BEFORE A MEAL (Patient taking differently: Take 40 mg by mouth at bedtime. ) .  Saw Palmetto, Serenoa repens, 320 MG CAPS, Take 320 mg by mouth at bedtime.  .  triamcinolone cream (KENALOG) 0.1 %, Apply 1 application topically 3 (three) times daily. Apply to rash bid x 7-10 days, then prn when rash return .  gabapentin (NEURONTIN) 100 MG capsule, Take 2 capsules (200 mg total) by mouth at bedtime.   Reviewed prior external information including notes and imaging from  primary care provider As well as notes that were available from care everywhere and other healthcare systems.  Past medical history, social, surgical and family history all reviewed in electronic medical record.  No pertanent information unless stated regarding to the chief complaint.   Review of Systems:  No headache, visual changes, nausea, vomiting, diarrhea, constipation, dizziness, abdominal pain, skin rash, fevers, chills, night sweats, weight loss, swollen lymph nodes, body aches, joint swelling, chest pain, shortness of breath, mood changes. POSITIVE muscle aches  Objective  Blood pressure 140/90, pulse 66, height 5\' 7"  (1.702 m), weight 197 lb (89.4 kg), SpO2 98 %.   General: No  apparent distress alert and oriented x3 mood and affect normal, dressed appropriately.  HEENT: Pupils equal, extraocular movements intact  Respiratory: Patient's speak in full sentences and does not appear short of breath  Cardiovascular: No lower extremity edema, non tender, no erythema  Neuro: Cranial nerves II through XII are intact, neurovascularly intact in all extremities with 2+ DTRs and 2+ pulses.  Gait normal with good balance and coordination.  MSK: Arthritic changes of multiple joints.  Patient's back is significant tightness and patient does have significant tightness of the all his joints and decrease in range of motion.  Voluntary and involuntary guarding noted.  Appears to be neurovascularly intact of the lower extremities.  Deep tendon reflexes are intact.  Severe tenderness more in the thoracolumbar juncture right greater than left.  Range of motion secondary to tightness.  [id number]; 15 additional minutes spent for Therapeutic exercises as stated in above notes.  This included exercises focusing on stretching, strengthening, with significant focus on eccentric aspects.   Long term goals include an improvement in range of motion, strength, endurance as well as avoiding reinjury. Patient's frequency would include in 1-2 times a day, 3-5 times a week for a duration of 6-12 weeks.   Low back exercises that included:  Pelvic tilt/bracing instruction to focus on control of the pelvic girdle and lower abdominal muscles  Glute strengthening exercises, focusing on proper firing of the glutes without engaging the low back muscles Proper stretching techniques for maximum relief for the hamstrings, hip flexors, low back and some rotation where tolerated  Proper technique shown and discussed handout in great detail with ATC.  All questions were discussed and answered.       Impression and Recommendations:     This case required medical decision making of moderate complexity. The above  documentation has been reviewed and is accurate and complete Lyndal Pulley, DO       Note: This dictation was prepared with Dragon dictation along with smaller phrase technology. Any transcriptional errors that result from this process are unintentional.

## 2019-06-12 NOTE — Patient Instructions (Signed)
Visit Information  Goals Addressed      Patient Stated   . "I would like to talk to you about my diabetes" (pt-stated)       Current Barriers:  [name] Knowledge Deficits related to disease process and self health management for Diabetes . Chronic Disease Management support and education needs related to DMII, Insomnia, Depression   Nurse Case Manager & PharmD Clinical Goal(s):  . 06/10/19 New Over the next 90 days, patient will continue to work with the CCM team and Endocrinology to increase his knowledge and understanding about the disease process and ways to improve Self Health management of DM  . 06/10/19 New Over the next 90 days, patient will lower his A1c <7.0 %  CCM RN CM Interventions:  06/10/19 call completed with patient  . Evaluation of current treatment plan related to DMII and patient's adherence to plan as established by provider . Determined patient completed his Endocrinology visit with Dr. Kelton Pillar on 05/23/19 with the following Assessment/Plan noted:  o 1) Type 2 Diabetes Mellitus, Poorly controlled, With CKD III complications - Most recent A1c of 9.4 %. Goal A1c < 7.0 %.   o Plan: o GENERAL:  I have discussed with the patient the pathophysiology of diabetes. We went over the natural progression of the disease. We talked about both insulin resistance and insulin deficiency. We stressed the importance of lifestyle changes including diet and exercise. I explained the complications associated with diabetes including retinopathy, nephropathy, neuropathy as well as increased risk of cardiovascular disease. We went over the benefit seen with glycemic control.   I explained to the patient that diabetic patients are at higher than normal risk for amputations.   We discussed the reason for his elevated A1c is post-prandial hyperglycemia and basal insulin is not going to improve that.   Discussed pharmacokinetics of basal/bolus insulin  We also discussed avoiding sugar-sweetened  beverages and snacks, when possible.   Pt is reluctant to try GLP-1 agonists due to severe nausea and vomiting in the past requiring hospitalization  We discussed the limitation of glycemic agents given his CKD  We discussed prandial insulin and the importance of taking it with meals ONLY o MEDICATIONS: o - Decrease Tresiba to 40 units daily  o - Start Novolog 8 units with each meal  o EDUCATION / INSTRUCTIONS:  BG monitoring instructions: Patient is instructed to check his blood sugars 3 times a day, before meals and bedtime .( pt eats 2 meals a day )  Call [facility] if: BG persistently < 70 or > 300.  I reviewed the Rule of 15 for the treatment of hypoglycemia in detail with the patient. Literature supplied. o 2) Diabetic complications:   Eye: Does not have known diabetic retinopathy.   Neuro/ Feet: Does not  have known diabetic peripheral neuropathy.  Renal: Patient does have known baseline CKD. He is on an ACEI/ARB at present o 3) Lipids: Patient is on Simvastatin 40 mg daily. LDL at goal . Discussed cardiovascular benefits  . Discussed that patient has a good understanding of his Endo recommendations and reports adherence to this plan of care  . Determined patient's [name] shipment has not arrived to the office via [employer]  . Coordinated with [place] office staff a sample will be provided . Sent in basket message to embedded Pharm D Jannette Fogo requesting she f/u with [employer] to inquire about patient's [place] shipment . Discussed plans with patient for ongoing care management follow up and  provided patient with direct contact information for care management team  Previously reported by embedded Pharm D . Optimization of DM therapy: o Patient reports throat swelling with Trulicity that resulted in an ED admission.  Will not start GLP1 due to this reported reaction.  Allergy list updated in the EMR o Metformin d/c'd due to kidney  function. 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  Will message PCP  Patient Self Care Activities:  . Self administers medications as prescribed . Attends all scheduled provider appointments . Calls pharmacy for medication refills . Performs ADL's independently . Performs IADL's independently . Calls provider office for new concerns or questions  Please see past updates related to this goal by clicking on the "Past Updates" button in the selected goal     . "to ease back pain" (pt-stated)       CARE PLAN ENTRY (see longitudinal plan of care for additional care plan information)  Current Barriers:  [name] Knowledge Deficits related to evaluation and treatment of lower back pain  . Chronic Disease Management support and education needs related to Diabetes Mellitus, Insomnia, Depression  Nurse Case Manager Clinical Goal(s):  [name] Over the next 90 days, patient will verbalize understanding of plan for evaluation and treatment of lower back pain   Interventions:  . Inter-disciplinary care team collaboration (see longitudinal plan of care) . Evaluation of current treatment plan related to low back pain and patient's adherence to plan as established by provider . Determined patient followed up with Dr. Tamala Julian at [place] to evaluate and treat low back pain with the following treatment plan noted:  o Meloxicam o Gabapentin 200mg  o Tart cherry 1200 mg at night o Vit D 2000IU daily o Thoracic Lumbar o See me in 4-5 weeks . Discussed plans with patient for ongoing care management follow up and provided patient with direct contact information for care management team  Patient Self Care Activities:  . Self administers medications as prescribed . Attends all scheduled provider appointments . Calls pharmacy for medication  refills . Calls provider office for new concerns or questions  Initial goal documentation       Patient verbalizes understanding of instructions provided today.   Telephone follow up appointment with care management team member scheduled for: 07/23/19  Barb Merino, RN, BSN, CCM Care Management Coordinator [facility]  Direct Phone: [phone]

## 2019-06-12 NOTE — Chronic Care Management (AMB) (Signed)
Chronic Care Management   Follow Up Note   06/10/2019 Name: Gregory Friedman MRN: [id number] DOB: 1950/04/01  Referred by: Shelby Mattocks, PA-C Reason for referral : Chronic Care Management (FU RN CM [place])   Jule Destefano is a 69 y.o. year old male who is a primary care patient of [place], Sunday [place], [place]. The CCM team was consulted for assistance with chronic disease management and care coordination needs.    Review of patient status, including review of consultants reports, relevant laboratory and other test results, and collaboration with appropriate care team members and the patient's provider was performed as part of comprehensive patient evaluation and provision of chronic care management services.    SDOH (Social Determinants of Health) assessments performed: Yes - no Acute challenges identified See Care Plan activities for detailed interventions related to [place])   Placed outbound follow up call to patient for a CCM RN CM diabetes update.     Outpatient Encounter Medications as of 06/10/2019  Medication Sig  . Accu-Chek Softclix Lancets lancets USE AS DIRECTED  . aspirin EC 81 MG tablet Take 81 mg by mouth at bedtime.   . Coenzyme Q10 200 MG capsule Take 200 mg by mouth at bedtime.   . enalapril (VASOTEC) 10 MG tablet TAKE 1 TABLET EVERY DAY  . glucose blood (ACCU-CHEK GUIDE) test strip 1 each by Other route 2 (two) times daily. Use as instructed  . insulin aspart (NOVOLOG FLEXPEN) 100 UNIT/ML FlexPen Inject 8 Units into the skin 3 (three) times daily with meals.  . insulin degludec (TRESIBA FLEXTOUCH) 100 UNIT/ML FlexTouch Pen Inject 0.4 mLs (40 Units total) into the skin daily. 55 unites per day  . Insulin Pen Needle 32G X 4 MM MISC 1 Device by Does not apply route in the morning, at noon, in the evening, and at bedtime.  . meclizine (ANTIVERT) 25 MG tablet TAKE 1 TABLET THREE TIMES DAILY FOR DIZZINESS  . metoprolol succinate (TOPROL-XL) 25 MG  24 hr tablet Take 2 tablets (50 mg total) by mouth daily. (Patient taking differently: Take 50 mg by mouth at bedtime. )  . nitroGLYCERIN (NITROSTAT) 0.4 MG SL tablet Place 1 tablet (0.4 mg total) under the tongue every 5 (five) minutes as needed. Chest pain (Patient taking differently: Place 0.4 mg under the tongue every 5 (five) minutes as needed for chest pain. )  . omeprazole (PRILOSEC) 40 MG capsule TAKE 1 CAPSULE EVERY DAY BEFORE A MEAL (Patient taking differently: Take 40 mg by mouth at bedtime. )  . [place], [place]renoa repens, 320 MG CAPS Take 320 mg by mouth at bedtime.   . simvastatin (ZOCOR) 40 MG tablet TAKE 1 TABLET  EVERY EVENING.  [name] triamcinolone cream (KENALOG) 0.1 % Apply 1 application topically 3 (three) times daily. Apply to rash bid x 7-10 days, then prn when rash return   No facility-administered encounter medications on file as of 06/10/2019.     Objective:  Lab Results  Component Value Date   HGBA1C 9.4 (H) 03/27/2019   HGBA1C 8.5 (H) 12/05/2018   HGBA1C 8.2 (H) 09/12/2018   Lab Results  Component Value Date   MICROALBUR 10 10/31/2018   LDLCALC 57 04/17/2019   CREATININE 1.43 (H) 04/17/2019   BP Readings from Last 3 Encounters:  06/11/19 140/90  05/23/19 (!) 144/82  04/17/19 140/78    Goals Addressed      Patient Stated   . "I would like to talk to you about my diabetes" (pt-stated)  Current Barriers:  [name] Knowledge Deficits related to disease process and self health management for Diabetes . Chronic Disease Management support and education needs related to DMII, Insomnia, Depression   Nurse Case Manager & PharmD Clinical Goal(s):  . 06/10/19 New Over the next 90 days, patient will continue to work with the CCM team and Endocrinology to increase his knowledge and understanding about the disease process and ways to improve Self Health management of DM  . 06/10/19 New Over the next 90 days, patient will lower his A1c <7.0 %  CCM RN CM Interventions:    06/10/19 call completed with patient  . Evaluation of current treatment plan related to DMII and patient's adherence to plan as established by provider . Determined patient completed his Endocrinology visit with Dr. Kelton Pillar on 05/23/19 with the following Assessment/Plan noted:  o 1) Type 2 Diabetes Mellitus, Poorly controlled, With CKD III complications - Most recent A1c of 9.4 %. Goal A1c < 7.0 %.   o Plan: o GENERAL:  I have discussed with the patient the pathophysiology of diabetes. We went over the natural progression of the disease. We talked about both insulin resistance and insulin deficiency. We stressed the importance of lifestyle changes including diet and exercise. I explained the complications associated with diabetes including retinopathy, nephropathy, neuropathy as well as increased risk of cardiovascular disease. We went over the benefit seen with glycemic control.   I explained to the patient that diabetic patients are at higher than normal risk for amputations.   We discussed the reason for his elevated A1c is post-prandial hyperglycemia and basal insulin is not going to improve that.   Discussed pharmacokinetics of basal/bolus insulin  We also discussed avoiding sugar-sweetened beverages and snacks, when possible.   Pt is reluctant to try GLP-1 agonists due to severe nausea and vomiting in the past requiring hospitalization  We discussed the limitation of glycemic agents given his CKD  We discussed prandial insulin and the importance of taking it with meals ONLY o MEDICATIONS: o - Decrease Tresiba to 40 units daily  o - Start Novolog 8 units with each meal  o EDUCATION / INSTRUCTIONS:  BG monitoring instructions: Patient is instructed to check his blood sugars 3 times a day, before meals and bedtime .( pt eats 2 meals a day )  Call [facility] if: BG persistently < 70 or > 300.  I reviewed the Rule of 15 for the treatment of hypoglycemia in  detail with the patient. Literature supplied. o 2) Diabetic complications:   Eye: Does not have known diabetic retinopathy.   Neuro/ Feet: Does not  have known diabetic peripheral neuropathy.  Renal: Patient does have known baseline CKD. He is on an ACEI/ARB at present o 3) Lipids: Patient is on Simvastatin 40 mg daily. LDL at goal . Discussed cardiovascular benefits  . Discussed that patient has a good understanding of his Endo recommendations and reports adherence to this plan of care  . Determined patient's [name] shipment has not arrived to the office via [employer]  . Coordinated with [place] office staff a sample will be provided . Sent in basket message to embedded Pharm D Jannette Fogo requesting she f/u with [employer] to inquire about patient's [place] shipment . Discussed plans with patient for ongoing care management follow up and provided patient with direct contact information for care management team   Previously reported by embedded Pharm D . Optimization of DM therapy: o Patient reports throat swelling with Trulicity  that resulted in an ED admission.  Will not start GLP1 due to this reported reaction.  Allergy list updated in the EMR o Metformin d/c'd due to kidney function. 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  Will message PCP  Patient Self Care Activities:  . Self administers medications as prescribed . Attends all scheduled provider appointments . Calls pharmacy for medication refills . Performs ADL's independently . Performs IADL's independently . Calls provider office for new concerns or questions  Please see past updates related to this goal by clicking on the "Past Updates" button in the selected goal     . "to ease back pain" (pt-stated)       CARE PLAN ENTRY (see longitudinal plan of care for  additional care plan information)  Current Barriers:  [name] Knowledge Deficits related to evaluation and treatment of lower back pain  . Chronic Disease Management support and education needs related to Diabetes Mellitus, Insomnia, Depression  Nurse Case Manager Clinical Goal(s):  [name] Over the next 90 days, patient will verbalize understanding of plan for evaluation and treatment of lower back pain   Interventions:  . Inter-disciplinary care team collaboration (see longitudinal plan of care) . Evaluation of current treatment plan related to low back pain and patient's adherence to plan as established by provider . Determined patient followed up with Dr. Tamala Julian at [place] to evaluate and treat low back pain with the following treatment plan noted:  o Meloxicam o Gabapentin 200mg  o Tart cherry 1200 mg at night o Vit D 2000IU daily o Thoracic Lumbar o See me in 4-5 weeks . Discussed plans with patient for ongoing care management follow up and provided patient with direct contact information for care management team  Patient Self Care Activities:  . Self administers medications as prescribed . Attends all scheduled provider appointments . Calls pharmacy for medication refills . Calls provider office for new concerns or questions  Initial goal documentation       Plan:   Telephone follow up appointment with care management team member scheduled for: 07/23/19  Barb Merino, RN, BSN, CCM Care Management Coordinator [facility]  Direct Phone: [phone]

## 2019-06-18 ENCOUNTER — Ambulatory Visit: Payer: Self-pay

## 2019-06-18 ENCOUNTER — Other Ambulatory Visit: Payer: Self-pay

## 2019-06-18 ENCOUNTER — Telehealth: Payer: Medicare HMO

## 2019-06-18 DIAGNOSIS — F5101 Primary insomnia: Secondary | ICD-10-CM

## 2019-06-18 DIAGNOSIS — F325 Major depressive disorder, single episode, in full remission: Secondary | ICD-10-CM

## 2019-06-18 DIAGNOSIS — Z794 Long term (current) use of insulin: Secondary | ICD-10-CM

## 2019-06-25 NOTE — Chronic Care Management (AMB) (Signed)
Chronic Care Management   Follow Up Note   06/18/2019 Name: Gregory Friedman MRN: [id number] DOB: 1950-08-22  Referred by: Shelby Mattocks, PA-C Reason for referral : Chronic Care Management (FU RN CM Call - inbound call from patient )   Gregory Friedman is a 69 y.o. year old male who is a primary care patient of [place], [place], [place]. The CCM team was consulted for assistance with chronic disease management and care coordination needs.    Review of patient status, including review of consultants reports, relevant laboratory and other test results, and collaboration with appropriate care team members and the patient's provider was performed as part of comprehensive patient evaluation and provision of chronic care management services.    SDOH (Social Determinants of Health) assessments performed: Yes - No Acute Challenges identified at this time  See Care Plan activities for detailed interventions related to [place])   Inbound call completed with patient to advise he is concerned that his [name] has not arrived to the PCP office.     Outpatient Encounter Medications as of 06/18/2019  Medication Sig  . Accu-Chek Softclix Lancets lancets USE AS DIRECTED  . aspirin EC 81 MG tablet Take 81 mg by mouth at bedtime.   . Coenzyme Q10 200 MG capsule Take 200 mg by mouth at bedtime.   . enalapril (VASOTEC) 10 MG tablet TAKE 1 TABLET EVERY DAY  . gabapentin (NEURONTIN) 100 MG capsule Take 2 capsules (200 mg total) by mouth at bedtime.  [name] glucose blood (ACCU-CHEK GUIDE) test strip 1 each by Other route 2 (two) times daily. Use as instructed  . insulin aspart (NOVOLOG FLEXPEN) 100 UNIT/ML FlexPen Inject 8 Units into the skin 3 (three) times daily with meals.  . insulin degludec (TRESIBA FLEXTOUCH) 100 UNIT/ML FlexTouch Pen Inject 0.4 mLs (40 Units total) into the skin daily. 55 unites per day  . Insulin Pen Needle 32G X 4 MM MISC 1 Device by Does not apply route in the  morning, at noon, in the evening, and at bedtime.  . meclizine (ANTIVERT) 25 MG tablet TAKE 1 TABLET THREE TIMES DAILY FOR DIZZINESS  . meloxicam (MOBIC) 15 MG tablet TAKE 1 TABLET(15 MG) BY MOUTH DAILY  . metoprolol succinate (TOPROL-XL) 25 MG 24 hr tablet Take 2 tablets (50 mg total) by mouth daily. (Patient taking differently: Take 50 mg by mouth at bedtime. )  . nitroGLYCERIN (NITROSTAT) 0.4 MG SL tablet Place 1 tablet (0.4 mg total) under the tongue every 5 (five) minutes as needed. Chest pain (Patient taking differently: Place 0.4 mg under the tongue every 5 (five) minutes as needed for chest pain. )  . omeprazole (PRILOSEC) 40 MG capsule TAKE 1 CAPSULE EVERY DAY BEFORE A MEAL (Patient taking differently: Take 40 mg by mouth at bedtime. )  . Saw Palmetto, Serenoa repens, 320 MG CAPS Take 320 mg by mouth at bedtime.   . simvastatin (ZOCOR) 40 MG tablet TAKE 1 TABLET  EVERY EVENING.  [name] triamcinolone cream (KENALOG) 0.1 % Apply 1 application topically 3 (three) times daily. Apply to rash bid x 7-10 days, then prn when rash return   No facility-administered encounter medications on file as of 06/18/2019.     Objective:  Lab Results  Component Value Date   HGBA1C 9.4 (H) 03/27/2019   HGBA1C 8.5 (H) 12/05/2018   HGBA1C 8.2 (H) 09/12/2018   Lab Results  Component Value Date   MICROALBUR 10 10/31/2018   LDLCALC 57 04/17/2019   CREATININE 1.43 (H) 04/17/2019  BP Readings from Last 3 Encounters:  06/11/19 140/90  05/23/19 (!) 144/82  04/17/19 140/78    Goals Addressed      Patient Stated   . "I would like to talk to you about my diabetes" (pt-stated)   On track    Current Barriers:  [name] Knowledge Deficits related to disease process and self health management for Diabetes . Chronic Disease Management support and education needs related to DMII, Insomnia, Depression   Nurse Case Manager & PharmD Clinical Goal(s):  . 06/10/19 New Over the next 90 days, patient will continue to work  with the CCM team and Endocrinology to increase his knowledge and understanding about the disease process and ways to improve Self Health management of DM  . 06/10/19 New Over the next 90 days, patient will lower his A1c <7.0 %  CCM RN CM Interventions:  06/18/19 Inbound call completed with patient  . Evaluation of current treatment plan related to DMII and patient's adherence to plan as established by provider . Determined patient's [name] shipment has not yet arrived to the office via [employer]; Coordinated with [place] office staff a sample will be provided . Sent in basket message to embedded Pharm D Jannette Fogo requesting she f/u with Novo Nordisk to inquire about patient's [place] shipment . Determined the embedded Pharm D will follow up on the needed documentation in order to get patient's Tresiba shipped to the PCP office  . Determined patient completed his Endocrinology visit with Dr. Kelton Pillar on 05/23/19 with the following Assessment/Plan noted:  o 1) Type 2 Diabetes Mellitus, Poorly controlled, With CKD III complications - Most recent A1c of 9.4 %. Goal A1c < 7.0 %.   o Plan: o GENERAL:  I have discussed with the patient the pathophysiology of diabetes. We went over the natural progression of the disease. We talked about both insulin resistance and insulin deficiency. We stressed the importance of lifestyle changes including diet and exercise. I explained the complications associated with diabetes including retinopathy, nephropathy, neuropathy as well as increased risk of cardiovascular disease. We went over the benefit seen with glycemic control.   I explained to the patient that diabetic patients are at higher than normal risk for amputations.   We discussed the reason for his elevated A1c is post-prandial hyperglycemia and basal insulin is not going to improve that.   Discussed pharmacokinetics of basal/bolus insulin  We also discussed avoiding sugar-sweetened beverages  and snacks, when possible.   Pt is reluctant to try GLP-1 agonists due to severe nausea and vomiting in the past requiring hospitalization  We discussed the limitation of glycemic agents given his CKD  We discussed prandial insulin and the importance of taking it with meals ONLY o MEDICATIONS: o - Decrease Tresiba to 40 units daily  o - Start Novolog 8 units with each meal  o EDUCATION / INSTRUCTIONS:  BG monitoring instructions: Patient is instructed to check his blood sugars 3 times a day, before meals and bedtime .( pt eats 2 meals a day )  Call [facility] clinic if: BG persistently < 70 or > 300.  I reviewed the Rule of 15 for the treatment of hypoglycemia in detail with the patient. Literature supplied. o 2) Diabetic complications:   Eye: Does not have known diabetic retinopathy.   Neuro/ Feet: Does not  have known diabetic peripheral neuropathy.  Renal: Patient does have known baseline CKD. He is on an ACEI/ARB at present o 3) Lipids: Patient is on Simvastatin 40  mg daily. LDL at goal . Discussed cardiovascular benefits  . Determined patient is not satisfied with following the recommendations given by the Endocrinologist and states he plans to allow his PCP to continue managing this condition  . Discussed his next scheduled OV with PCP provider Audery Amel is scheduled for 06/26/19 @3 :30 PM and he plans to further discuss his DM concerns with PCP during this visit  . Discussed plans with patient for ongoing care management follow up and provided patient with direct contact information for care management team  Previously reported by embedded Pharm D . Optimization of DM therapy: o Patient reports throat swelling with Trulicity that resulted in an ED admission.  Will not start GLP1 due to this reported reaction.  Allergy list updated in the EMR o Metformin d/c'd due to kidney function. o Patient with recurrent yeast infections from SGLT2  o Consider re-trial of  SGLT2 or addition of meal time insulin given increase in A1c if increased dose of Tresiba 55 units will affect A1c.  Will message PCP  Patient Self Care Activities:  . Self administers medications as prescribed . Attends all scheduled provider appointments . Calls pharmacy for medication refills . Performs ADL's independently . Performs IADL's independently . Calls provider office for new concerns or questions  Please see past updates related to this goal by clicking on the "Past Updates" button in the selected goal     . "to ease back pain" (pt-stated)   On track    [place] (see longitudinal plan of care for additional care plan information)  Current Barriers:  [name] Knowledge Deficits related to evaluation and treatment of lower back pain  . Chronic Disease Management support and education needs related to Diabetes Mellitus, Insomnia, Depression  Nurse Case Manager Clinical Goal(s):  [name] Over the next 90 days, patient will verbalize understanding of plan for evaluation and treatment of lower back pain   Interventions:  . Inter-disciplinary care team collaboration (see longitudinal plan of care) . Evaluation of current treatment plan related to low back pain and patient's adherence to plan as established by provider . Determined patient followed up with Dr. Tamala Julian at [facility] to evaluate and treat low back pain with the following treatment plan noted:  o Meloxicam o Gabapentin 200mg  o Tart cherry 1200 mg at night o Vit D 2000IU daily o Thoracic Lumbar o See me in 4-5 weeks . Discussed plans with patient for ongoing care management follow up and provided patient with direct contact information for care management team  Patient Self Care Activities:  . Self administers medications as prescribed . Attends all scheduled provider appointments . Calls pharmacy for medication refills . Calls provider office for new concerns or questions  Initial goal documentation        Plan:   Telephone follow up appointment with care management team member scheduled for: 07/02/19 embedded Pharm D; 07/23/19 embedded RN CM  Barb Merino, RN, BSN, CCM Care Management Coordinator [facility] [facility]  Direct Phone: [phone]

## 2019-06-25 NOTE — Patient Instructions (Signed)
Visit Information  Goals Addressed      Patient Stated   . "I would like to talk to you about my diabetes" (pt-stated)   On track    Current Barriers:  [name] Knowledge Deficits related to disease process and self health management for Diabetes . Chronic Disease Management support and education needs related to DMII, Insomnia, Depression   Nurse Case Manager & PharmD Clinical Goal(s):  . 06/10/19 New Over the next 90 days, patient will continue to work with the CCM team and Endocrinology to increase his knowledge and understanding about the disease process and ways to improve Self Health management of DM  . 06/10/19 New Over the next 90 days, patient will lower his A1c <7.0 %  CCM RN CM Interventions:  06/18/19 Inbound call completed with patient  . Evaluation of current treatment plan related to DMII and patient's adherence to plan as established by provider . Determined patient's [name] shipment has not yet arrived to the office via [employer]; Coordinated with [place] office staff a sample will be provided . Sent in basket message to embedded Pharm D Jannette Fogo requesting she f/u with Novo Nordisk to inquire about patient's [place] shipment . Determined the embedded Pharm D will follow up on the needed documentation in order to get patient's Tresiba shipped to the PCP office  . Determined patient completed his Endocrinology visit with Dr. Kelton Pillar on 05/23/19 with the following Assessment/Plan noted:  o 1) Type 2 Diabetes Mellitus, Poorly controlled, With CKD III complications - Most recent A1c of 9.4 %. Goal A1c < 7.0 %.   o Plan: o GENERAL:  I have discussed with the patient the pathophysiology of diabetes. We went over the natural progression of the disease. We talked about both insulin resistance and insulin deficiency. We stressed the importance of lifestyle changes including diet and exercise. I explained the complications associated with diabetes including retinopathy,  nephropathy, neuropathy as well as increased risk of cardiovascular disease. We went over the benefit seen with glycemic control.   I explained to the patient that diabetic patients are at higher than normal risk for amputations.   We discussed the reason for his elevated A1c is post-prandial hyperglycemia and basal insulin is not going to improve that.   Discussed pharmacokinetics of basal/bolus insulin  We also discussed avoiding sugar-sweetened beverages and snacks, when possible.   Pt is reluctant to try GLP-1 agonists due to severe nausea and vomiting in the past requiring hospitalization  We discussed the limitation of glycemic agents given his CKD  We discussed prandial insulin and the importance of taking it with meals ONLY o MEDICATIONS: o - Decrease Tresiba to 40 units daily  o - Start Novolog 8 units with each meal  o EDUCATION / INSTRUCTIONS:  BG monitoring instructions: Patient is instructed to check his blood sugars 3 times a day, before meals and bedtime .( pt eats 2 meals a day )  Call [facility] clinic if: BG persistently < 70 or > 300.  I reviewed the Rule of 15 for the treatment of hypoglycemia in detail with the patient. Literature supplied. o 2) Diabetic complications:   Eye: Does not have known diabetic retinopathy.   Neuro/ Feet: Does not  have known diabetic peripheral neuropathy.  Renal: Patient does have known baseline CKD. He is on an ACEI/ARB at present o 3) Lipids: Patient is on Simvastatin 40 mg daily. LDL at goal . Discussed cardiovascular benefits  . Determined patient is not satisfied with  following the recommendations given by the Endocrinologist and states he plans to allow his PCP to continue managing this condition  . Discussed his next scheduled OV with PCP provider Audery Amel is scheduled for 06/26/19 @3 :30 PM and he plans to further discuss his DM concerns with PCP during this visit  . Discussed plans with patient for ongoing  care management follow up and provided patient with direct contact information for care management team  Previously reported by embedded Pharm D . Optimization of DM therapy: o Patient reports throat swelling with Trulicity that resulted in an ED admission.  Will not start GLP1 due to this reported reaction.  Allergy list updated in the EMR o Metformin d/c'd due to kidney function. 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  Will message PCP  Patient Self Care Activities:  . Self administers medications as prescribed . Attends all scheduled provider appointments . Calls pharmacy for medication refills . Performs ADL's independently . Performs IADL's independently . Calls provider office for new concerns or questions  Please see past updates related to this goal by clicking on the "Past Updates" button in the selected goal     . "to ease back pain" (pt-stated)   On track    [place] (see longitudinal plan of care for additional care plan information)  Current Barriers:  [name] Knowledge Deficits related to evaluation and treatment of lower back pain  . Chronic Disease Management support and education needs related to Diabetes Mellitus, Insomnia, Depression  Nurse Case Manager Clinical Goal(s):  [name] Over the next 90 days, patient will verbalize understanding of plan for evaluation and treatment of lower back pain   Interventions:  . Inter-disciplinary care team collaboration (see longitudinal plan of care) . Evaluation of current treatment plan related to low back pain and patient's adherence to plan as established by provider . Determined patient followed up with Dr. Tamala Julian at [facility] to evaluate and treat low back pain with the following treatment plan noted:  o Meloxicam o Gabapentin 200mg  o Tart cherry 1200 mg at  night o Vit D 2000IU daily o Thoracic Lumbar o See me in 4-5 weeks . Discussed plans with patient for ongoing care management follow up and provided patient with direct contact information for care management team  Patient Self Care Activities:  . Self administers medications as prescribed . Attends all scheduled provider appointments . Calls pharmacy for medication refills . Calls provider office for new concerns or questions  Initial goal documentation       Patient verbalizes understanding of instructions provided today.   Telephone follow up appointment with care management team member scheduled for: 07/02/19 embedded Pharm D; 07/23/19 embedded RN CM   Barb Merino, RN, BSN, CCM Care Management Coordinator [facility] [facility]  Direct Phone: [phone]

## 2019-06-26 ENCOUNTER — Encounter: Payer: Self-pay | Admitting: Internal Medicine

## 2019-06-26 ENCOUNTER — Other Ambulatory Visit: Payer: Self-pay

## 2019-06-26 ENCOUNTER — Ambulatory Visit (INDEPENDENT_AMBULATORY_CARE_PROVIDER_SITE_OTHER): Payer: Medicare HMO | Admitting: Internal Medicine

## 2019-06-26 VITALS — BP 134/82 | HR 58 | Temp 98.0°F | Ht 67.0 in | Wt 197.4 lb

## 2019-06-26 DIAGNOSIS — Z794 Long term (current) use of insulin: Secondary | ICD-10-CM

## 2019-06-26 DIAGNOSIS — E039 Hypothyroidism, unspecified: Secondary | ICD-10-CM

## 2019-06-26 DIAGNOSIS — F325 Major depressive disorder, single episode, in full remission: Secondary | ICD-10-CM

## 2019-06-26 DIAGNOSIS — L409 Psoriasis, unspecified: Secondary | ICD-10-CM | POA: Diagnosis not present

## 2019-06-26 DIAGNOSIS — I700 Atherosclerosis of aorta: Secondary | ICD-10-CM

## 2019-06-26 DIAGNOSIS — I10 Essential (primary) hypertension: Secondary | ICD-10-CM

## 2019-06-26 DIAGNOSIS — E1165 Type 2 diabetes mellitus with hyperglycemia: Secondary | ICD-10-CM | POA: Diagnosis not present

## 2019-06-26 DIAGNOSIS — C060 Malignant neoplasm of cheek mucosa: Secondary | ICD-10-CM | POA: Diagnosis not present

## 2019-06-26 NOTE — Patient Instructions (Signed)
My wellness coaching business es   [email or web address]

## 2019-06-26 NOTE — Progress Notes (Signed)
This visit occurred during the SARS-CoV-2 public health emergency.  Safety protocols were in place, including screening questions prior to the visit, additional usage of staff PPE, and extensive cleaning of exam room while observing appropriate contact time as indicated for disinfecting solutions.  Subjective:     Patient ID: Gregory Friedman , male    DOB: [birth year] , 69 y.o.   MRN: [id number]   Chief Complaint  Patient presents with  . Diabetes    HPI Pt is here for Dm FU Has been to endocrinology and was given rx for a different insulin to add 8 unites before meals, but he refuses to use this since there is risk of hypoglycemia. She reduced his [place] from 55 U to 40 U qd. But he decided to increase it to 45 U since he is not taking the short acting insuline.  He has been more active, and drinking celery water and eats 2 celery's per day and 8 TBSP olive oil, 2 TSBP honey and 2 lemons  every am fasting qd. His glucose as dropped to 80's. He does not want to return to the endocrinologist.  Has seen ortho for his back pain and was prescribed Meloxican and Gabapentin. He decided to d/c since interacts with his CAD and never started it. Has been drinking [place] and his back pain is gone.   Past Medical History:  Diagnosis Date  . Anxiety   . BPH (benign prostatic hyperplasia)   . Cancer ([place]) 2014   neck area  . Colon polyps   . Coronary artery disease   . Depression   . Diverticulosis   . GERD (gastroesophageal reflux disease)   . History of gout    toe  . 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 Hyperlipidemia   . Hypertension    states under control with med., has been on med. since 2003  . 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 Myocardial infarction ([place]) 2003  . Non-insulin dependent type 2 diabetes mellitus ([place])   . PONV (postoperative nausea and vomiting)   . S/P radiation  therapy    Received 8 fractions, then stopped due to mucositis with Radiation/ Cisplatin  . Status post dilation of esophageal narrowing   . Trigger thumb of right hand 01/2016     Family History  Problem Relation Age of Onset  . Stroke Sister   . Diabetes Brother   . Seizures Mother   . Asthma Father   . Thyroid cancer Daughter   . Diabetes Sister   . Parkinson's disease Brother   . Heart disease Brother   . Diabetes Brother   . Colon cancer Neg Hx      Current Outpatient Medications:  .  Accu-Chek Softclix Lancets lancets, USE AS DIRECTED, Disp: 100 each, Rfl: 3 .  aspirin EC 81 MG tablet, Take 81 mg by mouth at bedtime. , Disp: , Rfl:  .  Coenzyme Q10 200 MG capsule, Take 200 mg by mouth at bedtime. , Disp: , Rfl:  .  enalapril (VASOTEC) 10 MG tablet, TAKE 1 TABLET EVERY DAY, Disp: 90 tablet, Rfl: 1 .  glucose blood (ACCU-CHEK GUIDE) test strip, 1 each by Other route 2 (two) times daily. Use as instructed, Disp: 200 each, Rfl: 12 .  insulin aspart (NOVOLOG FLEXPEN) 100 UNIT/ML FlexPen, Inject 8 Units into the skin 3 (three) times daily with meals.,  Disp: 30 mL, Rfl: 3 .  insulin degludec (TRESIBA FLEXTOUCH) 100 UNIT/ML FlexTouch Pen, Inject 0.4 mLs (40 Units total) into the skin daily. 55 unites per day, Disp: 30 mL, Rfl: 3 .  Insulin Pen Needle 32G X 4 MM MISC, 1 Device by Does not apply route in the morning, at noon, in the evening, and at bedtime., Disp: 400 each, Rfl: 3 .  meclizine (ANTIVERT) 25 MG tablet, TAKE 1 TABLET THREE TIMES DAILY FOR DIZZINESS, Disp: 180 tablet, Rfl: 1 .  metoprolol succinate (TOPROL-XL) 25 MG 24 hr tablet, Take 2 tablets (50 mg total) by mouth daily. (Patient taking differently: Take 50 mg by mouth at bedtime. ), Disp: 180 tablet, Rfl: 3 .  Misc Natural Products (TART CHERRY ADVANCED PO), Take by mouth., Disp: , Rfl:  .  nitroGLYCERIN (NITROSTAT) 0.4 MG SL tablet, Place 1 tablet (0.4 mg total) under the tongue every 5 (five) minutes as needed. Chest  pain (Patient taking differently: Place 0.4 mg under the tongue every 5 (five) minutes as needed for chest pain. ), Disp: 25 tablet, Rfl: 3 .  omeprazole (PRILOSEC) 40 MG capsule, TAKE 1 CAPSULE EVERY DAY BEFORE A MEAL (Patient taking differently: Take 40 mg by mouth at bedtime. ), Disp: 90 capsule, Rfl: 0 .  Saw Palmetto, Serenoa repens, 320 MG CAPS, Take 320 mg by mouth at bedtime. , Disp: , Rfl:  .  simvastatin (ZOCOR) 40 MG tablet, TAKE 1 TABLET  EVERY EVENING., Disp: 90 tablet, Rfl: 1 .  triamcinolone cream (KENALOG) 0.1 %, Apply 1 application topically 3 (three) times daily. Apply to rash bid x 7-10 days, then prn when rash return, Disp: 80 g, Rfl: 1   No Known Allergies   Review of Systems   Today's Vitals   06/26/19 1512  BP: 134/82  Pulse: (!) 58  Temp: 98 F (36.7 C)  TempSrc: Oral  Weight: 197 lb 6.4 oz (89.5 kg)  Height: 5\' 7"  (1.702 m)   Body mass index is 30.92 kg/m.   Objective:  Physical Exam  Weight is up 2 lbs.   Constitutional: She is oriented to person, place, and time. he appears well-developed and well-nourished. No distress.  HENT:  Head: Normocephalic and atraumatic. His R forehead laceration has healed well.  Right Ear: External ear normal.  Left Ear: External ear normal.  Nose: Nose normal.  Eyes: Conjunctivae are normal. Right eye exhibits no discharge. Left eye exhibits no discharge. No scleral icterus.  Neck: Neck supple. No thyromegaly present.  No carotid bruits bilaterally  Cardiovascular: Normal rate and regular rhythm.  No murmur heard. Pulmonary/Chest: Effort normal and breath sounds normal. No respiratory distress.  Musculoskeletal: Normal range of motion. he exhibits no edema.  Lymphadenopathy: he has no cervical adenopathy.  Neurological: he is alert and oriented to person, place, and time.  Skin: Skin is warm and dry. Capillary refill takes less than 2 seconds. Has psoriasis rash on his L knee.  Psychiatric: he has a normal mood and  affect. His behavior is normal. Judgment and thought content normal.  Nursing note reviewed.  Assessment And Plan:   1. Type 2 diabetes mellitus with hyperglycemia, with long-term current use of insulin ([place])- not in good control. He may not have a choice but to get on the short acting insuline like prescribed by the endocrinologist, but we will see what his test shows when he gets it done.  - Hemoglobin A1c  2. Essential hypertension- stable. May continue current medication.  -  CMP14 + Anion Gap - CBC no Diff  3. Hypothyroidism, unspecified type- chronic. May continue current medication.   4. Psoriasis- chronic. May continue the steroid cream he has been using. FU prn.    Quentina Fronek RODRIGUEZ-SOUTHWORTH, PA-C    THE PATIENT IS ENCOURAGED TO PRACTICE SOCIAL DISTANCING DUE TO THE COVID-19 PANDEMIC.

## 2019-07-01 ENCOUNTER — Telehealth: Payer: Medicare HMO

## 2019-07-02 ENCOUNTER — Other Ambulatory Visit: Payer: Self-pay

## 2019-07-02 ENCOUNTER — Ambulatory Visit: Payer: Medicare HMO

## 2019-07-02 DIAGNOSIS — Z794 Long term (current) use of insulin: Secondary | ICD-10-CM

## 2019-07-02 DIAGNOSIS — I10 Essential (primary) hypertension: Secondary | ICD-10-CM

## 2019-07-02 NOTE — Chronic Care Management (AMB) (Signed)
Chronic Care Management Pharmacy  Name: Gregory Friedman  MRN: [id number] DOB: November 16, 1950  Chief Complaint/ HPI  Gregory Friedman,  69 y.o. , male presents for their Follow-Up CCM visit with the clinical pharmacist via telephone due to COVID-19 Pandemic.  PCP : Shelby Mattocks, PA-C  Their chronic conditions include: Hypertension, CAD, GERD, Hypothyroidism, Diabetes, mixed hyperlipidemia  Office Visits: 06/26/19 OV: Presented for DM follow up. Pt was given a Novolog by Endocrinologist (Dr. Kelton Pillar) and directed to use 8 units before meals but he refuses due to risk of hypoglycemia. Was also instructed by Dr. Kelton Pillar to decrease [name] to 55 units (since adding Novolog), but pt decided to use 45 units since he is not using short-acting insulin. He reported being more active and drinking celery water (2 celery sticks, 8 Tbsp olive oil, 2 Tbsp honey, and 2 lemons). Reported glucose has dropped to 80s. Does not wish to return to endocrinologist. Was prescribed meloxicam and gabapentin by Ortho for back pain but did not start it due to interaction with CAD. He reported drinking [place] and his back pain is gone. Labs ordered (HgbA1c, CMP14+EGFR, CBC no Diff). Will assess need for short actin insulin after results of A1c.   03/27/19 OV: Started on triamcinolone for psoriasis. Labs ordered (Hgb A1c). Tresiba increased to 55 units due to elevated/ worsening A1c. Referred to Endocrinology for DM.   02/27/19 OV: Advised pt to increase Tresiba to 50 units daily. F/u in 2 weeks. Face laceration healing well.   02/17/19 OV: Presented for suture removal from forehead laceration. Area is not well healed. All, but 1 of 6 visible sutures removed. Sutures were difficult to remove. F/U with PCP in 1 week to remove.   Consult Visits: 06/11/19 Sports Medicine OV w/ Dr. Tamala Julian: Referred for low and mid back pain. Given rx for gabapentin 228m nightly and short course of meloxicam. Avoid  long-term use of meloxicam due to elevated creatinine. Chose meloxicam over steroid due to uncontrolled diabetes. Pt instructed to do icing regimen and home exercises. Declined PT at this time, will consider at follow up. Xrays pending. Follow up in 4-6 weeks.   05/23/19 Endocrinology OV w/ Dr. SKelton Pillar Presented for initial diabetes evaluation after referral from PCP. Source of elevated A1c determined to be post prandial hyperglycemia. Pt is reluctant to try GLP-1 agonists due to severe N/V in the past requiring hospitalization. Decreased Tresiba to 40 units daily and added Novolog 8 units with each meal. Pt instructed to check BG three times daily (before his 2 meals and at bedtime).   04/17/19 Cardiology OV w/ Dr. RHarrington Challenger Presented for follow up of CAD. [place] No [place] pain reported. Breathing is okay. [place] due to dizziness and required sutures (history of vertigo). Labs ordered. Follow up in 8 months.   03/06/19 PT OV: Cervicalgia and vertigo. No note visible.   02/26/19 PT OV: Cervicalgia and vertigo. No note visible.   02/10/19 ED visit: Presented after fall with laceration to right forehead. Laceration repaired. Tdap offered, but declined. No other significant pain or injuries.   CCM Encounters: 06/18/19 RN: Reviewed current treatment plan for diabetes and educated pt on disease progression, possible complications, and need for prandial insulin.   06/10/19 RN: Evaluated current DM treatment plan and patient's adherence. Pt education provided.   05/15/19 RN: Coordinated refills for TTyler Aaswith PCP. Pt reports improved BG (low 100s) since increasing Tresiba to 55 units daily  and increasing exercise. (Swelling of throat with Trulicity that required ED admission)  04/07/19 PharmD: Comprehensive medication review performed. Provided dietary and exercise education and recommendations. Consider SGLT2 retrial or addition of meal time insulin.    03/31/19 RN: Evaluation of DM treatment plan and adherence. Provided pt education. Collaboration with PharmD.  02/25/19 CPhT: [name] approved through [employer] until 01/23/20.   02/03/19 PharmD: Comprehensive medication review performed. Dietary recommendations provided (avoid eating too many bananas). Increase Tresiba to 42 units daily.  01/02/19 CPhT: Faxed completed [name] PAP application to [employer]  01/02/19 CPhT: Pt mailed PAP application for [place]. Provider portion faxed to PCP.   Medications: Outpatient Encounter Medications as of 07/02/2019  Medication Sig  . Accu-Chek Softclix Lancets lancets USE AS DIRECTED  . aspirin EC 81 MG tablet Take 81 mg by mouth at bedtime.   . Coenzyme Q10 200 MG capsule Take 200 mg by mouth at bedtime.   . enalapril (VASOTEC) 10 MG tablet TAKE 1 TABLET EVERY DAY  . glucose blood (ACCU-CHEK GUIDE) test strip 1 each by Other route 2 (two) times daily. Use as instructed  . insulin degludec (TRESIBA FLEXTOUCH) 100 UNIT/ML FlexTouch Pen Inject 0.4 mLs (40 Units total) into the skin daily. 55 unites per day (Patient taking differently: Inject 40 Units into the skin daily. )  . Insulin Pen Needle 32G X 4 MM MISC 1 Device by Does not apply route in the morning, at noon, in the evening, and at bedtime.  . meclizine (ANTIVERT) 25 MG tablet TAKE 1 TABLET THREE TIMES DAILY FOR DIZZINESS (Patient taking differently: As needed)  . melatonin 1 MG TABS tablet Take 1 mg by mouth at bedtime.  . metoprolol succinate (TOPROL-XL) 50 MG 24 hr tablet Take 1 tablet (50 mg total) by mouth daily. NEW DIRECTIONS: TAKE 1 TABLET ONCE DAILY  . omeprazole (PRILOSEC) 40 MG capsule TAKE 1 CAPSULE EVERY DAY BEFORE A MEAL (Patient taking differently: Take 40 mg by mouth at bedtime. )  . [place], Serenoa repens, 320 MG CAPS Take 320 mg by mouth at bedtime.   . simvastatin (ZOCOR) 40 MG tablet TAKE 1 TABLET  EVERY EVENING.  [name] triamcinolone cream (KENALOG) 0.1 % Apply 1  application topically 3 (three) times daily. Apply to rash bid x 7-10 days, then prn when rash return  . insulin aspart (NOVOLOG FLEXPEN) 100 UNIT/ML FlexPen Inject 8 Units into the skin 3 (three) times daily with meals. (Patient not taking: Reported on 08/01/2019)  . Misc Natural Products (TART CHERRY ADVANCED PO) Take by mouth.  . nitroGLYCERIN (NITROSTAT) 0.4 MG SL tablet Place 1 tablet (0.4 mg total) under the tongue every 5 (five) minutes as needed. Chest pain (Patient not taking: Reported on 08/01/2019)  . [DISCONTINUED] Accu-Chek Softclix Lancets lancets USE AS DIRECTED  . [DISCONTINUED] meclizine (ANTIVERT) 25 MG tablet TAKE 1 TABLET THREE TIMES DAILY FOR DIZZINESS  . [DISCONTINUED] metoprolol succinate (TOPROL-XL) 25 MG 24 hr tablet Take 2 tablets (50 mg total) by mouth daily. (Patient taking differently: Take 50 mg by mouth at bedtime. )  . [DISCONTINUED] simvastatin (ZOCOR) 40 MG tablet TAKE 1 TABLET  EVERY EVENING.   No facility-administered encounter medications on file as of 07/02/2019.   Current Diagnosis/Assessment: SDOH Interventions     Most Recent Value  SDOH Interventions  Financial Strain Interventions --  [Assisted patient with obtaining refill for [place] from [place]  This Visit's Progress   . Pharmacy Care Plan       CARE PLAN ENTRY (see longitudinal plan of care for additional care plan information)  Current Barriers:  . Chronic Disease Management support, education, and care coordination needs related to Hyperlipidemia and Diabetes   Hyperlipidemia Lab Results  Component Value Date/Time   LDLCALC 57 04/17/2019 03:17 PM   . Pharmacist Clinical Goal(s): o Over the next 90 days, patient will work with PharmD and providers to maintain LDL goal < 70 . Current regimen:  o Simvastatin '40mg'$  daily . Interventions: o Provided dietary and exercise recommendations - Increase healthy fat intake, decrease fatty  foods o Recommend recheck thyroid hormones at next PCP appointment . Patient self care activities - Over the next 90 days, patient will: o Increase intake of healthy fats (Avocados, walnuts, flaxseed, etc) o Limit intake of fatty foods, fired, foods, and junk foods o Exercise at least 30 minutes 5 times a week  Diabetes Lab Results  Component Value Date/Time   HGBA1C 8.6 (H) 07/03/2019 04:22 PM   HGBA1C 9.4 (H) 03/27/2019 05:25 PM   . Pharmacist Clinical Goal(s): o Over the next 120 days, patient will work with PharmD and providers to achieve A1c goal <7% . Current regimen:  o Tresiba 40 units daily . Interventions: o Provided dietary and exercise recommendations (mailed handouts to patient) o Discuss addition of Farxiga '5mg'$  daily with PCP for additional blood glucose control o Assisted with obtaining refill for Tresiba from [employer] patient assistance program . Patient self care activities - Over the next 120 days, patient will: o Check blood sugar once daily, document, and provide at future appointments o Contact provider with any episodes of hypoglycemia o Increase intake of lean protein o Try PLATE method for meal planning  Medication management . Pharmacist Clinical Goal(s): o Over the next 90 days, patient will work with PharmD and providers to maintain optimal medication adherence . Current pharmacy: [facility] . Interventions o Comprehensive medication review performed. o Continue current medication management strategy . Patient self care activities - Over the next 90 days, patient will: o Focus on medication adherence by consider use of a pill box to organize medications o Take medications as prescribed o Report any questions or concerns to PharmD and/or provider(s)  Initial goal documentation        Diabetes   Recent Relevant Labs: Lab Results  Component Value Date/Time   HGBA1C 8.6 (H) 07/03/2019 04:22 PM   HGBA1C 9.4 (H) 03/27/2019 05:25 PM    MICROALBUR 10 10/31/2018 10:57 AM   MICROALBUR 1.0 04/19/2015 12:01 AM   MICROALBUR 0.2 05/14/2014 12:01 AM   Kidney Function Lab Results  Component Value Date/Time   CREATININE 1.54 (H) 07/03/2019 04:22 PM   CREATININE 1.43 (H) 04/17/2019 03:17 PM   CREATININE 1.41 (H) 04/19/2015 12:01 AM   CREATININE 1.35 05/14/2014 12:01 AM   CREATININE 1.4 (H) 05/20/2013 10:58 AM   CREATININE 1.3 11/14/2012 09:19 AM   GFRNONAA 45 (L) 07/03/2019 04:22 PM   GFRAA 53 (L) 07/03/2019 04:22 PM  Stage 3a CKD  Checking BG: Daily  Recent FBG Readings: Recent pre-meal BG readings:  Recent 2hr PP BG readings:   Recent HS BG readings:  Patient has failed these meds in past: Jardiance (yeast infections), alogliptin, metformin (low GFR), Kombiglyze (persistent hyperglycemia), glyburide (hypoglycemia), GLP-1 agonist (nausea and vomiting) Patient is currently uncontrolled on the following medications:  -Tresiba 40 units daily  Last diabetic Foot exam:  Last diabetic Eye exam: Lab Results  Component Value Date/Time   HMDIABEYEEXA No Retinopathy 09/14/2015 12:00 AM   We discussed:   Diet extensively  Oatmeal w/ blueberries or strawberries, 2 hard-boiled eggs, sausage, coffee w/ powdered creamer no sugar  Afternoon: Oatmeal cookie and coffee  Evening: Salad, vegetables  Snacks on celery, says it helps with acid reflux  Chicken twice a week  Drinks water, coffee, celery water, no juice or sodas  Does not eat out  Recommended increase in lean protein (chicken and fish)- wife cannot stand the smell of it so they can't eat it  Recommended PLATE method  Pt has not been recording blood sugars because they have been very high and it is discouraging him  Educated pt on the importance of recording blood sugars in order to adjust diabetic medications  Encouraged pt to not get discouraged  Administers Tresiba between 6-8pm  Does not want to go back to the Endocrinologist  States that Novolog  was too expensive  It was sent to him via mail order after Endocrinologist prescribed it without him knowing they were sending it and he returned it  He does not want to do any more injections  Pt says he also has trouble remembering things and does not want to add a three times a day insulin Exercise extensively  Patient uses a stationary bike for 30 minutes everyday (5-6 miles)  Does some strength training  Discussed addition of another medication (non-injectable) for diabetes- Farxiga. Pt is agreeable to trying a new medication  Pt did not get lab work at last [facility] (says the last 2 times he had blood work done at the office it was painful). PCP told him he could go to the hospital to get blood drawn  Pt states he will go today or tomorrow to have blood work done at the hospital   Plan Continue current medications  Recommend starting Farxiga '5mg'$  once daily to PCP. Will be seeing new PCP 8/26.    Hypertension   Office blood pressures are  BP Readings from Last 3 Encounters:  06/26/19 134/82  06/11/19 140/90  05/23/19 (!) 144/82   Patient has failed these meds in the past: Amlodipine, diltiazem, isosorbide mononitrate Patient is currently controlled on the following medications:  -Enalapril '10mg'$  daily -Metoprolol succinate '25mg'$  2 tablets daily  Patient checks BP at home 2-3 times daily  Patient home BP readings are ranging: 100/66  We discussed:  Diet and exercise extensively  Plan -Continue current medications   Hyperlipidemia   Lipid Panel     Component Value Date/Time   CHOL 128 04/17/2019 1517   TRIG 201 (H) 04/17/2019 1517   HDL 38 (L) 04/17/2019 1517   CHOLHDL 3.4 04/17/2019 1517   CHOLHDL 2.6 04/19/2015 0001   VLDL 27 04/19/2015 0001   LDLCALC 57 04/17/2019 1517   LABVLDL 33 04/17/2019 1517     The ASCVD Risk score (Goff DC Jr., et al., 2013) failed to calculate for the following reasons:   The valid total cholesterol range is 130 to 320 mg/dL    Patient has failed these meds in past: N/A Patient is currently uncontrolled on the following medications:  -Simvastatin '40mg'$  every evening -Coenzyme Q10 '200mg'$  daily at bedtime -Aspirin '81mg'$  daily  We discussed:  Diet and exercise extensively  Exercise and eating more healthy fats can help increase HDL  Reducing fatty foods, fried foods, and junk foods can help decrease triglycerides  Plan Continue current medications  Recommend recheck  thyroid hormones at next PCP appointment  Hypothyroidism   TSH  Date Value Ref Range Status  10/31/2018 4.080 0.450 - 4.500 uIU/mL Final    Patient has failed these meds in past: Levothyroxine Patient is currently controlled on the following medications: N/A  Plan Recheck thyroid hormones at next PCP visit  GERD   Patient has failed these meds in past: N/A Patient is currently controlled on the following medications:  -Omeprazole '40mg'$  capsule  We discussed:    Pt states that eating celery has helped his acid reflux  Plan Continue current medications   Vaccines   Reviewed and discussed patient's vaccination history.    Immunization History  Administered Date(s) Administered  . Influenza Split 11/24/2010, 12/06/2011  . Influenza, High Dose Seasonal PF 11/17/2014, 10/23/2017, 12/05/2018  . Influenza,inj,Quad PF,6+ Mos 11/25/2012, 01/01/2014  . Pneumococcal Conjugate-13 01/07/2015  . Pneumococcal Polysaccharide-23 10/21/2009  . Tdap 05/27/2009   Has not gotten Shingles vaccine due to cost  Plan Consider Shingles vaccine after deductible has been met later in the year  Medication Management   Pt uses [facility] for all medications Uses pill box? No - Puts them in a drawer to organize Pt endorses 99% compliance  We discussed:   Most medications are free through the mail  Misses a dose of medication once or twice a year  Importance of taking medications daily as directed  Plan Continue current  medication management strategy   Follow up: 1 month phone visit  Jannette Fogo, PharmD Clinical Pharmacist [facility] [phone]

## 2019-07-03 ENCOUNTER — Other Ambulatory Visit (HOSPITAL_COMMUNITY)
Admission: RE | Admit: 2019-07-03 | Discharge: 2019-07-03 | Disposition: A | Discharge status: Home / Self Care | Payer: Medicare HMO | Source: Ambulatory Visit | Attending: Internal Medicine | Admitting: Internal Medicine

## 2019-07-03 ENCOUNTER — Ambulatory Visit: Payer: Medicare HMO | Admitting: Internal Medicine

## 2019-07-03 DIAGNOSIS — I2510 Atherosclerotic heart disease of native coronary artery without angina pectoris: Secondary | ICD-10-CM | POA: Insufficient documentation

## 2019-07-03 DIAGNOSIS — E782 Mixed hyperlipidemia: Secondary | ICD-10-CM | POA: Diagnosis not present

## 2019-07-03 DIAGNOSIS — Z794 Long term (current) use of insulin: Secondary | ICD-10-CM | POA: Insufficient documentation

## 2019-07-03 DIAGNOSIS — E119 Type 2 diabetes mellitus without complications: Secondary | ICD-10-CM | POA: Diagnosis not present

## 2019-07-03 LAB — CBC
HCT: 41.5 % (ref 39.0–52.0)
Hemoglobin: 13.7 g/dL (ref 13.0–17.0)
MCH: 28 pg (ref 26.0–34.0)
MCHC: 33 g/dL (ref 30.0–36.0)
MCV: 84.9 fL (ref 80.0–100.0)
Platelets: 164 10*3/uL (ref 150–400)
RBC: 4.89 MIL/uL (ref 4.22–5.81)
RDW: 12.5 % (ref 11.5–15.5)
WBC: 5.1 10*3/uL (ref 4.0–10.5)
nRBC: 0 % (ref 0.0–0.2)

## 2019-07-03 LAB — COMPREHENSIVE METABOLIC PANEL
ALT: 15 U/L (ref 0–44)
AST: 19 U/L (ref 15–41)
Albumin: 4.1 g/dL (ref 3.5–5.0)
Alkaline Phosphatase: 57 U/L (ref 38–126)
Anion gap: 10 (ref 5–15)
BUN: 16 mg/dL (ref 8–23)
CO2: 26 mmol/L (ref 22–32)
Calcium: 9.4 mg/dL (ref 8.9–10.3)
Chloride: 102 mmol/L (ref 98–111)
Creatinine, Ser: 1.54 mg/dL — ABNORMAL HIGH (ref 0.61–1.24)
GFR calc Af Amer: 53 mL/min — ABNORMAL LOW (ref >60)
GFR calc non Af Amer: 45 mL/min — ABNORMAL LOW (ref >60)
Glucose, Bld: 263 mg/dL — ABNORMAL HIGH (ref 70–99)
Potassium: 4.3 mmol/L (ref 3.5–5.1)
Sodium: 138 mmol/L (ref 135–145)
Total Bilirubin: 0.8 mg/dL (ref 0.3–1.2)
Total Protein: 7.5 g/dL (ref 6.5–8.1)

## 2019-07-03 LAB — HEMOGLOBIN A1C
Hgb A1c MFr Bld: 8.6 % — ABNORMAL HIGH (ref 4.8–5.6)
Mean Plasma Glucose: 200.12 mg/dL

## 2019-07-09 ENCOUNTER — Ambulatory Visit: Payer: Medicare HMO | Admitting: Family Medicine

## 2019-07-18 ENCOUNTER — Ambulatory Visit: Payer: Medicare HMO | Admitting: Internal Medicine

## 2019-07-19 ENCOUNTER — Other Ambulatory Visit: Payer: Self-pay | Admitting: Internal Medicine

## 2019-07-19 DIAGNOSIS — E785 Hyperlipidemia, unspecified: Secondary | ICD-10-CM

## 2019-07-23 ENCOUNTER — Telehealth: Payer: Medicare HMO

## 2019-08-01 DIAGNOSIS — H40013 Open angle with borderline findings, low risk, bilateral: Secondary | ICD-10-CM | POA: Diagnosis not present

## 2019-08-01 NOTE — Patient Instructions (Addendum)
Visit Information  Goals Addressed            This Visit's Progress   . Pharmacy Care Plan       CARE PLAN ENTRY (see longitudinal plan of care for additional care plan information)  Current Barriers:  . Chronic Disease Management support, education, and care coordination needs related to Hyperlipidemia and Diabetes   Hyperlipidemia Lab Results  Component Value Date/Time   LDLCALC 57 04/17/2019 03:17 PM   . Pharmacist Clinical Goal(s): o Over the next 90 days, patient will work with PharmD and providers to maintain LDL goal < 70 . Current regimen:  o Simvastatin 40mg  daily . Interventions: o Provided dietary and exercise recommendations - Increase healthy fat intake, decrease fatty foods o Recommend recheck thyroid hormones at next PCP appointment . Patient self care activities - Over the next 90 days, patient will: o Increase intake of healthy fats (Avocados, walnuts, flaxseed, etc) o Limit intake of fatty foods, fired, foods, and junk foods o Exercise at least 30 minutes 5 times a week  Diabetes Lab Results  Component Value Date/Time   HGBA1C 8.6 (H) 07/03/2019 04:22 PM   HGBA1C 9.4 (H) 03/27/2019 05:25 PM   . Pharmacist Clinical Goal(s): o Over the next 120 days, patient will work with PharmD and providers to achieve A1c goal <7% . Current regimen:  o Tresiba 40 units daily . Interventions: o Provided dietary and exercise recommendations (mailed handouts to patient) o Discuss addition of Farxiga 5mg  daily with PCP for additional blood glucose control o Assisted with obtaining refill for Tresiba from [employer] patient assistance program . Patient self care activities - Over the next 120 days, patient will: o Check blood sugar once daily, document, and provide at future appointments o Contact provider with any episodes of hypoglycemia o Increase intake of lean protein o Try PLATE method for meal planning  Medication management . Pharmacist Clinical  Goal(s): o Over the next 90 days, patient will work with PharmD and providers to maintain optimal medication adherence . Current pharmacy: [facility] . Interventions o Comprehensive medication review performed. o Continue current medication management strategy . Patient self care activities - Over the next 90 days, patient will: o Focus on medication adherence by consider use of a pill box to organize medications o Take medications as prescribed o Report any questions or concerns to PharmD and/or provider(s)  Initial goal documentation        Gregory Friedman was given information about Chronic Care Management services today including:  1. CCM service includes personalized support from designated clinical staff supervised by his physician, including individualized plan of care and coordination with other care providers 2. 24/7 contact phone numbers for assistance for urgent and routine care needs. 3. Standard insurance, coinsurance, copays and deductibles apply for chronic care management only during months in which we provide at least 20 minutes of these services. Most insurances cover these services at 100%, however patients may be responsible for any copay, coinsurance and/or deductible if applicable. This service may help you avoid the need for more expensive face-to-face services. 4. Only one practitioner may furnish and bill the service in a calendar month. 5. The patient may stop CCM services at any time (effective at the end of the month) by phone call to the office staff.  Patient agreed to services and verbal consent obtained.   The patient verbalized understanding of instructions provided today and agreed to receive a mailed copy of patient instruction and/or  Scientist, clinical (histocompatibility and immunogenetics). Face to Face appointment with pharmacist scheduled for:  08/04/19 @ [place], PharmD Clinical Pharmacist [facility] [phone]   Diabetes Mellitus  and Nutrition, Adult When you have diabetes (diabetes mellitus), it is very important to have healthy eating habits because your blood sugar (glucose) levels are greatly affected by what you eat and drink. Eating healthy foods in the appropriate amounts, at about the same times every day, can help you:  Control your blood glucose.  Lower your risk of heart disease.  Improve your blood pressure.  Reach or maintain a healthy weight. Every person with diabetes is different, and each person has different needs for a meal plan. Your health care provider may recommend that you work with a diet and nutrition specialist (dietitian) to make a meal plan that is best for you. Your meal plan may vary depending on factors such as:  The calories you need.  The medicines you take.  Your weight.  Your blood glucose, blood pressure, and cholesterol levels.  Your activity level.  Other health conditions you have, such as heart or kidney disease. How do carbohydrates affect me? Carbohydrates, also called carbs, affect your blood glucose level more than any other type of food. Eating carbs naturally raises the amount of glucose in your blood. Carb counting is a method for keeping track of how many carbs you eat. Counting carbs is important to keep your blood glucose at a healthy level, especially if you use insulin or take certain oral diabetes medicines. It is important to know how many carbs you can safely have in each meal. This is different for every person. Your dietitian can help you calculate how many carbs you should have at each meal and for each snack. Foods that contain carbs include:  Bread, cereal, rice, pasta, and crackers.  Potatoes and corn.  Peas, beans, and lentils.  Milk and yogurt.  Fruit and juice.  Desserts, such as cakes, cookies, ice cream, and candy. How does alcohol affect me? Alcohol can cause a sudden decrease in blood glucose (hypoglycemia), especially if you use  insulin or take certain oral diabetes medicines. Hypoglycemia can be a life-threatening condition. Symptoms of hypoglycemia (sleepiness, dizziness, and confusion) are similar to symptoms of having too much alcohol. If your health care provider says that alcohol is safe for you, follow these guidelines:  Limit alcohol intake to no more than 1 drink per day for nonpregnant women and 2 drinks per day for men. One drink equals 12 oz of beer, 5 oz of wine, or 1 oz of hard liquor.  Do not drink on an empty stomach.  Keep yourself hydrated with water, diet soda, or unsweetened iced tea.  Keep in mind that regular soda, juice, and other mixers may contain a lot of sugar and must be counted as carbs. What are tips for following this plan?  Reading food labels  Start by checking the serving size on the "Nutrition Facts" label of packaged foods and drinks. The amount of calories, carbs, fats, and other nutrients listed on the label is based on one serving of the item. Many items contain more than one serving per package.  Check the total grams (g) of carbs in one serving. You can calculate the number of servings of carbs in one serving by dividing the total carbs by 15. For example, if a food has 30 g of total carbs, it would be equal to 2 servings of carbs.  Check the  number of grams (g) of saturated and trans fats in one serving. Choose foods that have low or no amount of these fats.  Check the number of milligrams (mg) of salt (sodium) in one serving. Most people should limit total sodium intake to less than 2,300 mg per day.  Always check the nutrition information of foods labeled as "low-fat" or "nonfat". These foods may be higher in added sugar or refined carbs and should be avoided.  Talk to your dietitian to identify your daily goals for nutrients listed on the label. Shopping  Avoid buying canned, premade, or processed foods. These foods tend to be high in fat, sodium, and added  sugar.  Shop around the outside edge of the grocery store. This includes fresh fruits and vegetables, bulk grains, fresh meats, and fresh dairy. Cooking  Use low-heat cooking methods, such as baking, instead of high-heat cooking methods like deep frying.  Cook using healthy oils, such as olive, canola, or sunflower oil.  Avoid cooking with butter, cream, or high-fat meats. Meal planning  Eat meals and snacks regularly, preferably at the same times every day. Avoid going long periods of time without eating.  Eat foods high in fiber, such as fresh fruits, vegetables, beans, and whole grains. Talk to your dietitian about how many servings of carbs you can eat at each meal.  Eat 4-6 ounces (oz) of lean protein each day, such as lean meat, chicken, fish, eggs, or tofu. One oz of lean protein is equal to: ? 1 oz of meat, chicken, or fish. ? 1 egg. ?  cup of tofu.  Eat some foods each day that contain healthy fats, such as avocado, nuts, seeds, and fish. Lifestyle  Check your blood glucose regularly.  Exercise regularly as told by your health care provider. This may include: ? 150 minutes of moderate-intensity or vigorous-intensity exercise each week. This could be brisk walking, biking, or water aerobics. ? Stretching and doing strength exercises, such as yoga or weightlifting, at least 2 times a week.  Take medicines as told by your health care provider.  Do not use any products that contain nicotine or tobacco, such as cigarettes and e-cigarettes. If you need help quitting, ask your health care provider.  Work with a Social worker or diabetes educator to identify strategies to manage stress and any emotional and social challenges. Questions to ask a health care provider  Do I need to meet with a diabetes educator?  Do I need to meet with a dietitian?  What number can I call if I have questions?  When are the best times to check my blood glucose? Where to find more  information:  American Diabetes Association: diabetes.org  Academy of Nutrition and Dietetics: www.eatright.[employer] of Diabetes and Digestive and Kidney Diseases (NIH): [employer] Summary  A healthy meal plan will help you control your blood glucose and maintain a healthy lifestyle.  Working with a diet and nutrition specialist (dietitian) can help you make a meal plan that is best for you.  Keep in mind that carbohydrates (carbs) and alcohol have immediate effects on your blood glucose levels. It is important to count carbs and to use alcohol carefully. This information is not intended to replace advice given to you by your health care provider. Make sure you discuss any questions you have with your health care provider. Document Revised: 12/22/2016 Document Reviewed: 02/14/2016 Elsevier Patient Education  2020 [employer].

## 2019-08-04 ENCOUNTER — Telehealth: Payer: Self-pay | Admitting: Internal Medicine

## 2019-08-04 ENCOUNTER — Ambulatory Visit: Payer: Medicare HMO

## 2019-08-04 NOTE — Chronic Care Management (AMB) (Signed)
  Chronic Care Management   Note  08/04/2019 Name: Gregory Friedman MRN: [id number] DOB: Oct 31, 1950  Gregory Friedman is a 69 y.o. year old male who is a primary care patient of [place], Sandrea Matte and is actively engaged with the care management team. I reached out to [name] by phone today to assist with re-scheduling a follow up visit with the Pharmacist  Follow up plan: Face to Face appointment with care management team member scheduled for:  08/12/2019.  [place], [place] [address] Direct Dial: [phone] [email or web address]: [email or web address]

## 2019-08-12 ENCOUNTER — Ambulatory Visit (INDEPENDENT_AMBULATORY_CARE_PROVIDER_SITE_OTHER): Payer: Medicare HMO

## 2019-08-12 ENCOUNTER — Other Ambulatory Visit: Payer: Self-pay

## 2019-08-12 DIAGNOSIS — E1165 Type 2 diabetes mellitus with hyperglycemia: Secondary | ICD-10-CM

## 2019-08-12 DIAGNOSIS — Z794 Long term (current) use of insulin: Secondary | ICD-10-CM | POA: Diagnosis not present

## 2019-08-12 DIAGNOSIS — I10 Essential (primary) hypertension: Secondary | ICD-10-CM | POA: Diagnosis not present

## 2019-08-12 NOTE — Chronic Care Management (AMB) (Signed)
Chronic Care Management Pharmacy  Name: Gregory Friedman  MRN: [id number] DOB: November 16, 1950  Chief Complaint/ HPI  Gregory Friedman,  69 y.o. , male presents for their Follow-Up CCM visit with the clinical pharmacist via telephone due to COVID-19 Pandemic.  PCP : Gregory Mattocks, PA-C  Their chronic conditions include: Hypertension, CAD, GERD, Hypothyroidism, Diabetes, mixed hyperlipidemia  Office Visits: 06/26/19 OV: Presented for DM follow up. Pt was given a Novolog by Endocrinologist (Dr. Kelton Friedman) and directed to use 8 units before meals but he refuses due to risk of hypoglycemia. Was also instructed by Dr. Kelton Friedman to decrease Gregory Friedman to 55 units (since adding Novolog), but pt decided to use 45 units since he is not using short-acting insulin. He reported being more active and drinking celery water (2 celery sticks, 8 Tbsp olive oil, 2 Tbsp honey, and 2 lemons). Reported glucose has dropped to 80s. Does not wish to return to endocrinologist. Was prescribed meloxicam and gabapentin by Ortho for back pain but did not start it due to interaction with CAD. He reported drinking [place] and his back pain is gone. Labs ordered (HgbA1c, CMP14+EGFR, CBC no Diff). Will assess need for short actin insulin after results of A1c.   03/27/19 OV: Started on triamcinolone for psoriasis. Labs ordered (Hgb A1c). Tresiba increased to 55 units due to elevated/ worsening A1c. Referred to Endocrinology for DM.   02/27/19 OV: Advised pt to increase Tresiba to 50 units daily. F/u in 2 weeks. Face laceration healing well.   02/17/19 OV: Presented for suture removal from forehead laceration. Area is not well healed. All, but 1 of 6 visible sutures removed. Sutures were difficult to remove. F/U with PCP in 1 week to remove.   Consult Visits: 06/11/19 Sports Medicine OV w/ Dr. Tamala Friedman: Referred for low and mid back pain. Given rx for gabapentin 228m nightly and short course of meloxicam. Avoid  long-term use of meloxicam due to elevated creatinine. Chose meloxicam over steroid due to uncontrolled diabetes. Pt instructed to do icing regimen and home exercises. Declined PT at this time, will consider at follow up. Xrays pending. Follow up in 4-6 weeks.   05/23/19 Endocrinology OV w/ Dr. SKelton Friedman Presented for initial diabetes evaluation after referral from PCP. Source of elevated A1c determined to be post prandial hyperglycemia. Pt is reluctant to try GLP-1 agonists due to severe N/V in the past requiring hospitalization. Decreased Tresiba to 40 units daily and added Novolog 8 units with each meal. Pt instructed to check BG three times daily (before his 2 meals and at bedtime).   04/17/19 Cardiology OV w/ Dr. RHarrington Friedman Presented for follow up of CAD. [place] of [place] No chest pain reported. Breathing is okay. [place] due to dizziness and required sutures (history of vertigo). Labs ordered. Follow up in 8 months.   03/06/19 PT OV: Cervicalgia and vertigo. No note visible.   02/26/19 PT OV: Cervicalgia and vertigo. No note visible.   02/10/19 ED visit: Presented after fall with laceration to right forehead. Laceration repaired. Tdap offered, but declined. No other significant pain or injuries.   CCM Encounters: 06/18/19 RN: Reviewed current treatment plan for diabetes and educated pt on disease progression, possible complications, and need for prandial insulin.   06/10/19 RN: Evaluated current DM treatment plan and patient's adherence. Pt education provided.   05/15/19 RN: Coordinated refills for TTyler Aaswith PCP. Pt reports improved BG (low 100s) since increasing Tresiba to 55 units daily  and increasing exercise. (Swelling of throat with Trulicity that required ED admission)  04/07/19 PharmD: Comprehensive medication review performed. Provided dietary and exercise education and recommendations. Consider SGLT2 retrial or addition of meal time insulin.    03/31/19 RN: Evaluation of DM treatment plan and adherence. Provided pt education. Collaboration with PharmD.  02/25/19 CPhT: Gregory Friedman approved through [employer] until 01/23/20.   02/03/19 PharmD: Comprehensive medication review performed. Dietary recommendations provided (avoid eating too many bananas). Increase Tresiba to 42 units daily.  01/02/19 CPhT: Faxed completed Gregory Friedman PAP application to [employer]  01/02/19 CPhT: Pt mailed PAP application for [place]. Provider portion faxed to PCP.   Medications: Outpatient Encounter Medications as of 08/12/2019  Medication Sig  . Accu-Chek Softclix Lancets lancets USE AS DIRECTED  . aspirin EC 81 MG tablet Take 81 mg by mouth at bedtime.   . Coenzyme Q10 200 MG capsule Take 200 mg by mouth at bedtime.   . enalapril (VASOTEC) 10 MG tablet TAKE 1 TABLET EVERY DAY  . glucose blood (ACCU-CHEK GUIDE) test strip 1 each by Other route 2 (two) times daily. Use as instructed  . insulin aspart (NOVOLOG FLEXPEN) 100 UNIT/ML FlexPen Inject 8 Units into the skin 3 (three) times daily with meals. (Patient not taking: Reported on 08/01/2019)  . insulin degludec (TRESIBA FLEXTOUCH) 100 UNIT/ML FlexTouch Pen Inject 0.4 mLs (40 Units total) into the skin daily. 55 unites per day (Patient taking differently: Inject 40 Units into the skin daily. )  . Insulin Pen Needle 32G X 4 MM MISC 1 Device by Does not apply route in the morning, at noon, in the evening, and at bedtime.  . meclizine (ANTIVERT) 25 MG tablet TAKE 1 TABLET THREE TIMES DAILY FOR DIZZINESS (Patient taking differently: As needed)  . melatonin 1 MG TABS tablet Take 1 mg by mouth at bedtime.  . metoprolol succinate (TOPROL-XL) 50 MG 24 hr tablet Take 1 tablet (50 mg total) by mouth daily. NEW DIRECTIONS: TAKE 1 TABLET ONCE DAILY  . Misc Natural Products (TART CHERRY ADVANCED PO) Take by mouth.  . nitroGLYCERIN (NITROSTAT) 0.4 MG SL tablet Place 1 tablet (0.4 mg total) under the tongue every 5 (five)  minutes as needed. Chest pain (Patient not taking: Reported on 08/01/2019)  . omeprazole (PRILOSEC) 40 MG capsule TAKE 1 CAPSULE EVERY DAY BEFORE A MEAL (Patient taking differently: Take 40 mg by mouth at bedtime. )  . [place], Serenoa repens, 320 MG CAPS Take 320 mg by mouth at bedtime.   . simvastatin (ZOCOR) 40 MG tablet TAKE 1 TABLET  EVERY EVENING.  [name] triamcinolone cream (KENALOG) 0.1 % Apply 1 application topically 3 (three) times daily. Apply to rash bid x 7-10 days, then prn when rash return   No facility-administered encounter medications on file as of 08/12/2019.   Current Diagnosis/Assessment:  Goals Addressed            This Visit's Progress   . Pharmacy Care Plan       CARE PLAN ENTRY (see longitudinal plan of care for additional care plan information)  Current Barriers:  . Chronic Disease Management support, education, and care coordination needs related to Hypertension, Hyperlipidemia, and Diabetes   Hypertension BP Readings from Last 3 Encounters:  06/26/19 134/82  06/11/19 140/90  05/23/19 (!) 144/82   . Pharmacist Clinical Goal(s): o Over the next 180 days, patient will work with PharmD and providers to maintain BP goal <130/80 . Current regimen:  o Enalapril 43m daily o Metoprolol  succinate 80m 2 tablets daily . Interventions: o Provided dietary and exercise recommendations . Patient self care activities - Over the next 180 days, patient will: o Check BP daily, document, and provide at future appointments o Ensure daily salt intake < 2300 mg/day o Exercise for 30 minutes daily 5 times per week  Hyperlipidemia Lab Results  Component Value Date/Time   LDLCALC 57 04/17/2019 03:17 PM   . Pharmacist Clinical Goal(s): o Over the next 90 days, patient will work with PharmD and providers to maintain LDL goal < 70 . Current regimen:  o Simvastatin 477mdaily . Interventions: o Provided dietary and exercise recommendations - Increase healthy fat  intake, decrease fatty foods o Recommend recheck thyroid hormones at next PCP appointment . Patient self care activities - Over the next 90 days, patient will: o Increase intake of healthy fats (Avocados, walnuts, flaxseed, etc) o Limit intake of fatty foods, fired, foods, and junk foods o Exercise at least 30 minutes 5 times a week  Diabetes Lab Results  Component Value Date/Time   HGBA1C 8.6 (H) 07/03/2019 04:22 PM   HGBA1C 9.4 (H) 03/27/2019 05:25 PM   . Pharmacist Clinical Goal(s): o Over the next 120 days, patient will work with PharmD and providers to achieve A1c goal <7% . Current regimen:  o Tresiba 40 units daily . Interventions: o Provided dietary and exercise recommendations (mailed handouts to patient) o Discuss addition of Farxiga 95m59maily with PCP for additional blood glucose control o Assisted with obtaining [place] for [place] [employer] assistance program o Reviewed patient's most recent blood glucose readings . Patient self care activities - Over the next 120 days, patient will: o Check blood sugar once daily, document, and provide at future appointments o Contact provider with any episodes of hypoglycemia o Increase intake of lean protein o Try PLATE method for meal planning  Medication management . Pharmacist Clinical Goal(s): o Over the next 90 days, patient will work with PharmD and providers to maintain optimal medication adherence . [facility] o Comprehensive medication review performed. o Continue current medication management strategy . Patient self care activities - Over the next 90 days, patient will: o Focus on medication adherence by consider use of a pill box to organize medications o Take medications as prescribed o Report any questions or concerns to PharmD and/or provider(s)  Please see past updates related to this goal by clicking on the "Past Updates" button in the selected goal          Diabetes   Recent Relevant Labs: Lab Results  Component Value Date/Time   HGBA1C 8.6 (H) 07/03/2019 04:22 PM   HGBA1C 9.4 (H) 03/27/2019 05:25 PM   MICROALBUR 10 10/31/2018 10:57 AM   MICROALBUR 1.0 04/19/2015 12:01 AM   MICROALBUR 0.2 05/14/2014 12:01 AM   Kidney Function Lab Results  Component Value Date/Time   CREATININE 1.54 (H) 07/03/2019 04:22 PM   CREATININE 1.43 (H) 04/17/2019 03:17 PM   CREATININE 1.41 (H) 04/19/2015 12:01 AM   CREATININE 1.35 05/14/2014 12:01 AM   CREATININE 1.4 (H) 05/20/2013 10:58 AM   CREATININE 1.3 11/14/2012 09:19 AM   GFRNONAA 45 (L) 07/03/2019 04:22 PM   GFRAA 53 (L) 07/03/2019 04:22 PM  Stage 3a CKD  Checking BG: Daily  ,Recent FBG Readings: 105,107,113 Recent pre-meal BG readings:  Recent 2hr PP BG readings:   Recent HS BG readings:  Patient has failed these meds in past: Jardiance (yeast infections), alogliptin,  metformin (low GFR), Kombiglyze (persistent hyperglycemia), glyburide (hypoglycemia), GLP-1 agonist (nausea and vomiting) Patient is currently uncontrolled on the following medications:  -Tresiba 40 units daily  Last diabetic Foot exam:  Last diabetic Eye exam: Lab Results  Component Value Date/Time   HMDIABEYEEXA No Retinopathy 09/14/2015 12:00 AM   We discussed:  . Diet and exercise extensively . Pt states that he has less urgency to go to the bathroom now . Pt states that he feels better overall . Episodes of dizziness are better . Eye doctor noted improvement when he went for his last appointment . Advised pt that I recommended Farxiga to PCP, and this may be discussed at his next appointment with PCP . HgbA1c was improved when checked last month . Congratulated pt on improvements in BG and encouraged him to continue healthy diet and exercising   Plan Continue current medications  Recommended starting Farxiga 42m once daily to PCP. Will be seeing new PCP 8/26.    Hypertension   Office blood pressures are   BP Readings from Last 3 Encounters:  06/26/19 134/82  06/11/19 140/90  05/23/19 (!) 144/82   Patient has failed these meds in the past: Amlodipine, diltiazem, isosorbide mononitrate Patient is currently controlled on the following medications:  -Enalapril 166mdaily -Metoprolol succinate 2533m tablets daily  Patient checks BP at home 2-3 times daily  Patient home BP readings are ranging: 105/69  We discussed:  Diet and exercise extensively  Plan -Continue current medications   Medication Management   Pt uses [facility] all medications Uses pill box? No - Puts them in a drawer to organize Pt endorses 99% compliance  We discussed:   Most medications are free through the mail  Misses a dose of medication once or twice a year  Importance of taking medications daily as directed  Plan Continue current medication management strategy   Follow up: 2 month phone visit  CouJannette FogoharmD Clinical Pharmacist [facility] [phone]

## 2019-08-25 NOTE — Patient Instructions (Addendum)
Visit Information  Goals Addressed            This Visit's Progress   . Pharmacy Care Plan       CARE PLAN ENTRY (see longitudinal plan of care for additional care plan information)  Current Barriers:  . Chronic Disease Management support, education, and care coordination needs related to Hypertension, Hyperlipidemia, and Diabetes   Hypertension BP Readings from Last 3 Encounters:  06/26/19 134/82  06/11/19 140/90  05/23/19 (!) 144/82   . Pharmacist Clinical Goal(s): o Over the next 180 days, patient will work with PharmD and providers to maintain BP goal <130/80 . Current regimen:  o Enalapril 10mg  daily o Metoprolol succinate 25mg  2 tablets daily . Interventions: o Provided dietary and exercise recommendations . Patient self care activities - Over the next 180 days, patient will: o Check BP daily, document, and provide at future appointments o Ensure daily salt intake < 2300 mg/day o Exercise for 30 minutes daily 5 times per week  Hyperlipidemia Lab Results  Component Value Date/Time   LDLCALC 57 04/17/2019 03:17 PM   . Pharmacist Clinical Goal(s): o Over the next 90 days, patient will work with PharmD and providers to maintain LDL goal < 70 . Current regimen:  o Simvastatin 40mg  daily . Interventions: o Provided dietary and exercise recommendations - Increase healthy fat intake, decrease fatty foods o Recommend recheck thyroid hormones at next PCP appointment . Patient self care activities - Over the next 90 days, patient will: o Increase intake of healthy fats (Avocados, walnuts, flaxseed, etc) o Limit intake of fatty foods, fired, foods, and junk foods o Exercise at least 30 minutes 5 times a week  Diabetes Lab Results  Component Value Date/Time   HGBA1C 8.6 (H) 07/03/2019 04:22 PM   HGBA1C 9.4 (H) 03/27/2019 05:25 PM   . Pharmacist Clinical Goal(s): o Over the next 120 days, patient will work with PharmD and providers to achieve A1c goal <7% . Current  regimen:  o Tresiba 40 units daily . Interventions: o Provided dietary and exercise recommendations (mailed handouts to patient) o Discuss addition of Farxiga 5mg  daily with PCP for additional blood glucose control o Assisted with obtaining refill for [place] from [employer] patient assistance program o Reviewed patient's most recent blood glucose readings . Patient self care activities - Over the next 120 days, patient will: o Check blood sugar once daily, document, and provide at future appointments o Contact provider with any episodes of hypoglycemia o Increase intake of lean protein o Try PLATE method for meal planning  Medication management . Pharmacist Clinical Goal(s): o Over the next 90 days, patient will work with PharmD and providers to maintain optimal medication adherence . Current pharmacy: [facility] . Interventions o Comprehensive medication review performed. o Continue current medication management strategy . Patient self care activities - Over the next 90 days, patient will: o Focus on medication adherence by consider use of a pill box to organize medications o Take medications as prescribed o Report any questions or concerns to PharmD and/or provider(s)  Please see past updates related to this goal by clicking on the "Past Updates" button in the selected goal         The patient verbalized understanding of instructions provided today and agreed to receive a mailed copy of patient instruction and/or educational materials.  Telephone follow up appointment with pharmacy team member scheduled for: 11/05/19 @ 3:30 PM  Jannette Fogo, PharmD Clinical Pharmacist [facility]  [phone]   Diabetes Mellitus and Nutrition, Adult When you have diabetes (diabetes mellitus), it is very important to have healthy eating habits because your blood sugar (glucose) levels are greatly affected by what you eat and drink. Eating healthy foods in  the appropriate amounts, at about the same times every day, can help you:  Control your blood glucose.  Lower your risk of heart disease.  Improve your blood pressure.  Reach or maintain a healthy weight. Every person with diabetes is different, and each person has different needs for a meal plan. Your health care provider may recommend that you work with a diet and nutrition specialist (dietitian) to make a meal plan that is best for you. Your meal plan may vary depending on factors such as:  The calories you need.  The medicines you take.  Your weight.  Your blood glucose, blood pressure, and cholesterol levels.  Your activity level.  Other health conditions you have, such as heart or kidney disease. How do carbohydrates affect me? Carbohydrates, also called carbs, affect your blood glucose level more than any other type of food. Eating carbs naturally raises the amount of glucose in your blood. Carb counting is a method for keeping track of how many carbs you eat. Counting carbs is important to keep your blood glucose at a healthy level, especially if you use insulin or take certain oral diabetes medicines. It is important to know how many carbs you can safely have in each meal. This is different for every person. Your dietitian can help you calculate how many carbs you should have at each meal and for each snack. Foods that contain carbs include:  Bread, cereal, rice, pasta, and crackers.  Potatoes and corn.  Peas, beans, and lentils.  Milk and yogurt.  Fruit and juice.  Desserts, such as cakes, cookies, ice cream, and candy. How does alcohol affect me? Alcohol can cause a sudden decrease in blood glucose (hypoglycemia), especially if you use insulin or take certain oral diabetes medicines. Hypoglycemia can be a life-threatening condition. Symptoms of hypoglycemia (sleepiness, dizziness, and confusion) are similar to symptoms of having too much alcohol. If your health care  provider says that alcohol is safe for you, follow these guidelines:  Limit alcohol intake to no more than 1 drink per day for nonpregnant women and 2 drinks per day for men. One drink equals 12 oz of beer, 5 oz of wine, or 1 oz of hard liquor.  Do not drink on an empty stomach.  Keep yourself hydrated with water, diet soda, or unsweetened iced tea.  Keep in mind that regular soda, juice, and other mixers may contain a lot of sugar and must be counted as carbs. What are tips for following this plan?  Reading food labels  Start by checking the serving size on the "Nutrition Facts" label of packaged foods and drinks. The amount of calories, carbs, fats, and other nutrients listed on the label is based on one serving of the item. Many items contain more than one serving per package.  Check the total grams (g) of carbs in one serving. You can calculate the number of servings of carbs in one serving by dividing the total carbs by 15. For example, if a food has 30 g of total carbs, it would be equal to 2 servings of carbs.  Check the number of grams (g) of saturated and trans fats in one serving. Choose foods that have low or no amount of these fats.  Check the number of milligrams (mg) of salt (sodium) in one serving. Most people should limit total sodium intake to less than 2,300 mg per day.  Always check the nutrition information of foods labeled as "low-fat" or "nonfat". These foods may be higher in added sugar or refined carbs and should be avoided.  Talk to your dietitian to identify your daily goals for nutrients listed on the label. Shopping  Avoid buying canned, premade, or processed foods. These foods tend to be high in fat, sodium, and added sugar.  Shop around the outside edge of the grocery store. This includes fresh fruits and vegetables, bulk grains, fresh meats, and fresh dairy. Cooking  Use low-heat cooking methods, such as baking, instead of high-heat cooking methods like  deep frying.  Cook using healthy oils, such as olive, canola, or sunflower oil.  Avoid cooking with butter, cream, or high-fat meats. Meal planning  Eat meals and snacks regularly, preferably at the same times every day. Avoid going long periods of time without eating.  Eat foods high in fiber, such as fresh fruits, vegetables, beans, and whole grains. Talk to your dietitian about how many servings of carbs you can eat at each meal.  Eat 4-6 ounces (oz) of lean protein each day, such as lean meat, chicken, fish, eggs, or tofu. One oz of lean protein is equal to: ? 1 oz of meat, chicken, or fish. ? 1 egg. ?  cup of tofu.  Eat some foods each day that contain healthy fats, such as avocado, nuts, seeds, and fish. Lifestyle  Check your blood glucose regularly.  Exercise regularly as told by your health care provider. This may include: ? 150 minutes of moderate-intensity or vigorous-intensity exercise each week. This could be brisk walking, biking, or water aerobics. ? Stretching and doing strength exercises, such as yoga or weightlifting, at least 2 times a week.  Take medicines as told by your health care provider.  Do not use any products that contain nicotine or tobacco, such as cigarettes and e-cigarettes. If you need help quitting, ask your health care provider.  Work with a Social worker or diabetes educator to identify strategies to manage stress and any emotional and social challenges. Questions to ask a health care provider  Do I need to meet with a diabetes educator?  Do I need to meet with a dietitian?  What number can I call if I have questions?  When are the best times to check my blood glucose? Where to find more information:  American Diabetes Association: diabetes.org  Academy of Nutrition and Dietetics: www.eatright.[employer] of Diabetes and Digestive and Kidney Diseases (NIH): [employer] Summary  A healthy meal plan will help you control  your blood glucose and maintain a healthy lifestyle.  Working with a diet and nutrition specialist (dietitian) can help you make a meal plan that is best for you.  Keep in mind that carbohydrates (carbs) and alcohol have immediate effects on your blood glucose levels. It is important to count carbs and to use alcohol carefully. This information is not intended to replace advice given to you by your health care provider. Make sure you discuss any questions you have with your health care provider. Document Revised: 12/22/2016 Document Reviewed: 02/14/2016 Elsevier Patient Education  2020 [employer].

## 2019-09-02 ENCOUNTER — Telehealth: Payer: Self-pay

## 2019-09-03 ENCOUNTER — Telehealth: Payer: Medicare HMO

## 2019-09-05 NOTE — Chronic Care Management (AMB) (Signed)
Chronic Care Management Pharmacy Assistant   Name: Gregory Friedman  MRN: [id number] DOB: 10-02-1950  Reason for Encounter: Medication Review/ Patient Assistance Follow Up  PCP : Gregory Mattocks, PA-C  Allergies:  No Known Allergies  Medications: Outpatient Encounter Medications as of 09/02/2019  Medication Sig  . Accu-Chek Softclix Lancets lancets USE AS DIRECTED  . aspirin EC 81 MG tablet Take 81 mg by mouth at bedtime.   . Coenzyme Q10 200 MG capsule Take 200 mg by mouth at bedtime.   . enalapril (VASOTEC) 10 MG tablet TAKE 1 TABLET EVERY DAY  . glucose blood (ACCU-CHEK GUIDE) test strip 1 each by Other route 2 (two) times daily. Use as instructed  . insulin aspart (NOVOLOG FLEXPEN) 100 UNIT/ML FlexPen Inject 8 Units into the skin 3 (three) times daily with meals. (Patient not taking: Reported on 08/01/2019)  . insulin degludec (TRESIBA FLEXTOUCH) 100 UNIT/ML FlexTouch Pen Inject 0.4 mLs (40 Units total) into the skin daily. 55 unites per day (Patient taking differently: Inject 40 Units into the skin daily. )  . Insulin Pen Needle 32G X 4 MM MISC 1 Device by Does not apply route in the morning, at noon, in the evening, and at bedtime.  . meclizine (ANTIVERT) 25 MG tablet TAKE 1 TABLET THREE TIMES DAILY FOR DIZZINESS (Patient taking differently: As needed)  . melatonin 1 MG TABS tablet Take 1 mg by mouth at bedtime.  . metoprolol succinate (TOPROL-XL) 50 MG 24 hr tablet Take 1 tablet (50 mg total) by mouth daily. NEW DIRECTIONS: TAKE 1 TABLET ONCE DAILY  . Misc Natural Products (TART CHERRY ADVANCED PO) Take by mouth.  . nitroGLYCERIN (NITROSTAT) 0.4 MG SL tablet Place 1 tablet (0.4 mg total) under the tongue every 5 (five) minutes as needed. Chest pain (Patient not taking: Reported on 08/01/2019)  . omeprazole (PRILOSEC) 40 MG capsule TAKE 1 CAPSULE EVERY DAY BEFORE A MEAL (Patient taking differently: Take 40 mg by mouth at bedtime. )  . [place], Serenoa repens, 320  MG CAPS Take 320 mg by mouth at bedtime.   . simvastatin (ZOCOR) 40 MG tablet TAKE 1 TABLET  EVERY EVENING.  [name] triamcinolone cream (KENALOG) 0.1 % Apply 1 application topically 3 (three) times daily. Apply to rash bid x 7-10 days, then prn when rash return   No facility-administered encounter medications on file as of 09/02/2019.    Current Diagnosis: Patient Active Problem List   Diagnosis Date Noted  . Low back pain 06/11/2019  . Type 2 diabetes mellitus with hyperglycemia, with long-term current use of insulin ([place]) 05/23/2019  . Type 2 diabetes mellitus with stage 3a chronic kidney disease, with long-term current use of insulin ([place]) 05/23/2019  . Dyslipidemia 05/23/2019  . Psoriasis 12/12/2018  . Skin lesion 10/31/2018  . Diabetes mellitus type 2, insulin dependent ([place]) 09/12/2018  . Elevated serum creatinine 09/12/2018  . Hypothyroidism 10/10/2017  . Atherosclerosis of aorta ([place]) 10/10/2017  . Unspecified skin changes 10/10/2017  . Other long term (current) drug therapy 10/10/2017  . Abdominal pain 09/21/2017  . Nausea & vomiting 09/21/2017  . History of head and neck cancer 09/21/2017  . History of colonic polyps 04/04/2016  . Dysphagia 04/04/2016  . Bloating 04/04/2016  . Diarrhea 04/04/2016  . Trigger finger of right thumb 08/23/2015  . Chest pain 07/15/2015  . Pain in the chest   . S/P CABG (coronary artery bypass graft)   . Noncompliance with medications 07/08/2015  . Acute gout 04/19/2015  .  GERD (gastroesophageal reflux disease) 05/14/2014  . Depression, major, in remission ([place]) 03/27/2013  . Allergic rhinitis, cause unspecified 03/27/2013  . 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 Depressive disorder, not elsewhere classified 12/06/2011  . Nonspecific (abnormal) findings on radiological and other examination of gastrointestinal tract 02/10/2011  . Dysphagia, unspecified(787.20) 02/10/2011  . Palpitations 02/02/2011  . Dizziness 07/15/2010    . DIAB W/OPHTH MANIFESTS TYPE I [JUV TYPE] UNCNTRL 08/02/2009  . Mixed hyperlipidemia 08/02/2009  . Essential hypertension 08/02/2009  . CAD (coronary artery disease) 08/02/2009     Follow-Up:  Patient [place] to follow up on reorder form sent on 06/11/19 for Gregory Friedman, spoke with Gregory Friedman she informed that form was not complete fully, they could not read the providers NPI number and medication did not specify if it was Flextouch or vial. Gregory Friedman stated they tried to call patient with this follow up but was never called back. Refilling out order form with correct information. Awaiting provider signature. Gregory Friedman aware and patient has been informed, he will receive samples until medication arrives to the office.  09/22/2019- Gregory Friedman, CPA spoke with Gregory Friedman inquiring on patient medication Gregory Friedman. Form was processed on 09/17/19, will be 10-14 days before arriving to the office. Gregory Friedman, CPP notified.   Gregory Friedman, Gregory Friedman Pharmacist Assistant [phone]

## 2019-09-18 ENCOUNTER — Ambulatory Visit (INDEPENDENT_AMBULATORY_CARE_PROVIDER_SITE_OTHER): Payer: Medicare HMO

## 2019-09-18 ENCOUNTER — Ambulatory Visit (INDEPENDENT_AMBULATORY_CARE_PROVIDER_SITE_OTHER): Payer: Medicare HMO | Admitting: Nurse Practitioner

## 2019-09-18 ENCOUNTER — Other Ambulatory Visit: Payer: Self-pay

## 2019-09-18 ENCOUNTER — Encounter: Payer: Self-pay | Admitting: Nurse Practitioner

## 2019-09-18 VITALS — BP 128/78 | HR 54 | Temp 98.0°F | Ht 66.0 in | Wt 193.8 lb

## 2019-09-18 VITALS — BP 128/98 | HR 54 | Temp 98.0°F | Ht 66.0 in | Wt 193.8 lb

## 2019-09-18 DIAGNOSIS — E1165 Type 2 diabetes mellitus with hyperglycemia: Secondary | ICD-10-CM

## 2019-09-18 DIAGNOSIS — I10 Essential (primary) hypertension: Secondary | ICD-10-CM

## 2019-09-18 DIAGNOSIS — Z794 Long term (current) use of insulin: Secondary | ICD-10-CM

## 2019-09-18 DIAGNOSIS — Z0000 Encounter for general adult medical examination without abnormal findings: Secondary | ICD-10-CM | POA: Diagnosis not present

## 2019-09-18 DIAGNOSIS — E785 Hyperlipidemia, unspecified: Secondary | ICD-10-CM

## 2019-09-18 NOTE — Patient Instructions (Signed)
Gregory Friedman , Thank you for taking time to come for your Medicare Wellness Visit. I appreciate your ongoing commitment to your health goals. Please review the following plan we discussed and let me know if I can assist you in the future.   Screening recommendations/referrals: Colonoscopy: completed 05/30/2017 Recommended yearly ophthalmology/optometry visit for glaucoma screening and checkup Recommended yearly dental visit for hygiene and checkup  Vaccinations: Influenza vaccine: due Pneumococcal vaccine: completed 01/07/2015 Tdap vaccine: decline Shingles vaccine: discussed   Covid-19:  decline  Advanced directives: Advance directive discussed with you today. Even though you declined this today please call our office should you change your mind and we can give you the proper paperwork for you to fill out.   Conditions/risks identified: none  Next appointment: 01/01/2020 at 2:15  Follow up in one year for your annual wellness visit.   Preventive Care 70 Years and Older, Male Preventive care refers to lifestyle choices and visits with your health care provider that can promote health and wellness. What does preventive care include?  A yearly physical exam. This is also called an annual well check.  Dental exams once or twice a year.  Routine eye exams. Ask your health care provider how often you should have your eyes checked.  Personal lifestyle choices, including:  Daily care of your teeth and gums.  Regular physical activity.  Eating a healthy diet.  Avoiding tobacco and drug use.  Limiting alcohol use.  Practicing safe sex.  Taking low doses of aspirin every day.  Taking vitamin and mineral supplements as recommended by your health care provider. What happens during an annual well check? The services and screenings done by your health care provider during your annual well check will depend on your age, overall health, lifestyle risk factors, and family history  of disease. Counseling  Your health care provider may ask you questions about your:  Alcohol use.  Tobacco use.  Drug use.  Emotional well-being.  Home and relationship well-being.  Sexual activity.  Eating habits.  History of falls.  Memory and ability to understand (cognition).  Work and work Statistician. Screening  You may have the following tests or measurements:  Height, weight, and BMI.  Blood pressure.  Lipid and cholesterol levels. These may be checked every 5 years, or more frequently if you are over 49 years old.  Skin check.  Lung cancer screening. You may have this screening every year starting at age 69 if you have a 30-pack-year history of smoking and currently smoke or have quit within the past 15 years.  Fecal occult blood test (FOBT) of the stool. You may have this test every year starting at age 7.  Flexible sigmoidoscopy or colonoscopy. You may have a sigmoidoscopy every 5 years or a colonoscopy every 10 years starting at age 62.  Prostate cancer screening. Recommendations will vary depending on your family history and other risks.  Hepatitis C blood test.  Hepatitis B blood test.  Sexually transmitted disease (STD) testing.  Diabetes screening. This is done by checking your blood sugar (glucose) after you have not eaten for a while (fasting). You may have this done every 1-3 years.  Abdominal aortic aneurysm (AAA) screening. You may need this if you are a current or former smoker.  Osteoporosis. You may be screened starting at age 29 if you are at high risk. Talk with your health care provider about your test results, treatment options, and if necessary, the need for more tests. Vaccines  Your  health care provider may recommend certain vaccines, such as:  Influenza vaccine. This is recommended every year.  Tetanus, diphtheria, and acellular pertussis (Tdap, Td) vaccine. You may need a Td booster every 10 years.  Zoster vaccine. You may  need this after age 61.  Pneumococcal 13-valent conjugate (PCV13) vaccine. One dose is recommended after age 47.  Pneumococcal polysaccharide (PPSV23) vaccine. One dose is recommended after age 73. Talk to your health care provider about which screenings and vaccines you need and how often you need them. This information is not intended to replace advice given to you by your health care provider. Make sure you discuss any questions you have with your health care provider. Document Released: 02/05/2015 Document Revised: 09/29/2015 Document Reviewed: 11/10/2014 Elsevier Interactive Patient Education  2017 [place] Prevention in the Home Falls can cause injuries. They can happen to people of all ages. There are many things you can do to make your home safe and to help prevent falls. What can I do on the outside of my home?  Regularly fix the edges of walkways and driveways and fix any cracks.  Remove anything that might make you trip as you walk through a door, such as a raised step or threshold.  Trim any bushes or trees on the path to your home.  Use bright outdoor lighting.  Clear any walking paths of anything that might make someone trip, such as rocks or tools.  Regularly check to see if handrails are loose or broken. Make sure that both sides of any steps have handrails.  Any raised decks and porches should have guardrails on the edges.  Have any leaves, snow, or ice cleared regularly.  Use sand or salt on walking paths during winter.  Clean up any spills in your garage right away. This includes oil or grease spills. What can I do in the bathroom?  Use night lights.  Install grab bars by the toilet and in the tub and shower. Do not use towel bars as grab bars.  Use non-skid mats or decals in the tub or shower.  If you need to sit down in the shower, use a plastic, non-slip stool.  Keep the floor dry. Clean up any water that spills on the floor as soon as it  happens.  Remove soap buildup in the tub or shower regularly.  Attach bath mats securely with double-sided non-slip rug tape.  Do not have throw rugs and other things on the floor that can make you trip. What can I do in the bedroom?  Use night lights.  Make sure that you have a light by your bed that is easy to reach.  Do not use any sheets or blankets that are too big for your bed. They should not hang down onto the floor.  Have a firm chair that has side arms. You can use this for support while you get dressed.  Do not have throw rugs and other things on the floor that can make you trip. What can I do in the kitchen?  Clean up any spills right away.  Avoid walking on wet floors.  Keep items that you use a lot in easy-to-reach places.  If you need to reach something above you, use a strong step stool that has a grab bar.  Keep electrical cords out of the way.  Do not use floor polish or wax that makes floors slippery. If you must use wax, use non-skid floor wax.  Do not  have throw rugs and other things on the floor that can make you trip. What can I do with my stairs?  Do not leave any items on the stairs.  Make sure that there are handrails on both sides of the stairs and use them. Fix handrails that are broken or loose. Make sure that handrails are as long as the stairways.  Check any carpeting to make sure that it is firmly attached to the stairs. Fix any carpet that is loose or worn.  Avoid having throw rugs at the top or bottom of the stairs. If you do have throw rugs, attach them to the floor with carpet tape.  Make sure that you have a light switch at the top of the stairs and the bottom of the stairs. If you do not have them, ask someone to add them for you. What else can I do to help prevent falls?  Wear shoes that:  Do not have high heels.  Have rubber bottoms.  Are comfortable and fit you well.  Are closed at the toe. Do not wear sandals.  If you  use a stepladder:  Make sure that it is fully opened. Do not climb a closed stepladder.  Make sure that both sides of the stepladder are locked into place.  Ask someone to hold it for you, if possible.  Clearly mark and make sure that you can see:  Any grab bars or handrails.  First and last steps.  Where the edge of each step is.  Use tools that help you move around (mobility aids) if they are needed. These include:  Canes.  Walkers.  Scooters.  Crutches.  Turn on the lights when you go into a dark area. Replace any light bulbs as soon as they burn out.  Set up your furniture so you have a clear path. Avoid moving your furniture around.  If any of your floors are uneven, fix them.  If there are any pets around you, be aware of where they are.  Review your medicines with your doctor. Some medicines can make you feel dizzy. This can increase your chance of falling. Ask your doctor what other things that you can do to help prevent falls. This information is not intended to replace advice given to you by your health care provider. Make sure you discuss any questions you have with your health care provider. Document Released: 11/05/2008 Document Revised: 06/17/2015 Document Reviewed: 02/13/2014 Elsevier Interactive Patient Education  2017 [employer].

## 2019-09-18 NOTE — Progress Notes (Signed)
This visit occurred during the SARS-CoV-2 public health emergency.  Safety protocols were in place, including screening questions prior to the visit, additional usage of staff PPE, and extensive cleaning of exam room while observing appropriate contact time as indicated for disinfecting solutions.  Subjective:   Gregory Friedman is a 69 y.o. male who presents for Medicare Annual/Subsequent preventive examination.  Review of Systems     Cardiac Risk Factors include: advanced age (>89men, >58 women);diabetes mellitus;dyslipidemia;obesity (BMI >30kg/m2);male gender     Objective:    Today's Vitals   09/18/19 1412  BP: 128/78  Pulse: (!) 54  Temp: 98 F (36.7 C)  TempSrc: Oral  SpO2: 97%  Weight: 193 lb 12.8 oz (87.9 kg)  Height: 5\' 6"  (1.676 m)   Body mass index is 31.28 kg/m.  Advanced Directives 09/18/2019 02/10/2019 09/12/2018 09/21/2017 09/21/2017 09/20/2017 02/24/2016  Does Patient Have a Medical Advance Directive? No No No No - No Yes  Type of Advance Directive - - - - - - Press photographer;Living will  Does patient want to make changes to medical advance directive? - - - - - - No - Patient declined  Copy of [place] in Chart? - - - - - - No - copy requested  Would patient like information on creating a medical advance directive? No - Patient declined No - Patient declined - Yes (ED - Information included in AVS) Yes (ED - Information included in AVS) - -  Pre-existing out of facility DNR order (yellow form or pink MOST form) - - - - - - -    Current Medications (verified) Outpatient Encounter Medications as of 09/18/2019  Medication Sig  . Accu-Chek Softclix Lancets lancets USE AS DIRECTED  . aspirin EC 81 MG tablet Take 81 mg by mouth at bedtime.   . Coenzyme Q10 200 MG capsule Take 200 mg by mouth at bedtime.   . enalapril (VASOTEC) 10 MG tablet TAKE 1 TABLET EVERY DAY  . glucose blood (ACCU-CHEK GUIDE) test strip 1 each by Other route 2 (two)  times daily. Use as instructed  . insulin degludec (TRESIBA FLEXTOUCH) 100 UNIT/ML FlexTouch Pen Inject 0.4 mLs (40 Units total) into the skin daily. 55 unites per day (Patient taking differently: Inject 40 Units into the skin daily. )  . Insulin Pen Needle 32G X 4 MM MISC 1 Device by Does not apply route in the morning, at noon, in the evening, and at bedtime.  [name] (OMEGA-3) 500 MG CAPS Take 1 capsule by mouth daily.  . meclizine (ANTIVERT) 25 MG tablet TAKE 1 TABLET THREE TIMES DAILY FOR DIZZINESS (Patient taking differently: As needed)  . melatonin 1 MG TABS tablet Take 1 mg by mouth at bedtime.  . metoprolol succinate (TOPROL-XL) 50 MG 24 hr tablet Take 1 tablet (50 mg total) by mouth daily. NEW DIRECTIONS: TAKE 1 TABLET ONCE DAILY  . Misc Natural Products (TART CHERRY ADVANCED PO) Take by mouth.  . nitroGLYCERIN (NITROSTAT) 0.4 MG SL tablet Place 1 tablet (0.4 mg total) under the tongue every 5 (five) minutes as needed. Chest pain  . omeprazole (PRILOSEC) 40 MG capsule TAKE 1 CAPSULE EVERY DAY BEFORE A MEAL (Patient taking differently: Take 40 mg by mouth at bedtime. )  . Saw [place]metto, [place] repens, 320 MG CAPS Take 320 mg by mouth at bedtime.   . simvastatin (ZOCOR) 40 MG tablet TAKE 1 TABLET  EVERY EVENING.  [name] triamcinolone cream (KENALOG) 0.1 % Apply 1  application topically 3 (three) times daily. Apply to rash bid x 7-10 days, then prn when rash return  . insulin aspart (NOVOLOG FLEXPEN) 100 UNIT/ML FlexPen Inject 8 Units into the skin 3 (three) times daily with meals. (Patient not taking: Reported on 08/01/2019)   No facility-administered encounter medications on file as of 09/18/2019.    Allergies (verified) Patient has no known allergies.   History: Past Medical History:  Diagnosis Date  . Anxiety   . BPH (benign prostatic hyperplasia)   . Cancer ([place]) 2014   neck area  . Colon polyps   . Coronary artery disease   . Depression   . Diverticulosis   . GERD  (gastroesophageal reflux disease)   . History of gout    toe  . 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 Hyperlipidemia   . Hypertension    states under control with med., has been on med. since 2003  . 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 Myocardial infarction ([place]) 2003  . Non-insulin dependent type 2 diabetes mellitus ([place])   . PONV (postoperative nausea and vomiting)   . S/P radiation therapy    Received 8 fractions, then stopped due to mucositis with Radiation/ Cisplatin  . Status post dilation of esophageal narrowing   . Trigger thumb of right hand 01/2016   Past Surgical History:  Procedure Laterality Date  . CARDIAC CATHETERIZATION  05/25/2010  . CARDIOVASCULAR STRESS TEST  07/2009  . COLONOSCOPY WITH PROPOFOL  02/16/2011  . CORONARY ANGIOPLASTY  2003; 2006   2 stents in heart  . CORONARY ARTERY BYPASS GRAFT  2005  . CYSTOSCOPY  02/09/2011   Procedure: CYSTOSCOPY;  Surgeon: Marissa Nestle, MD;  Location: [facility];  Service: Urology;  Laterality: N/A;  . ESOPHAGOGASTRODUODENOSCOPY (EGD) WITH ESOPHAGEAL DILATION  02/16/2011   with Propofol  . FOOT ARTHRODESIS, SUBTALAR Right 10/13/2009  . MASS EXCISION N/A 03/13/2012   Procedure: EXCISION LEFT BUCCAL MUCOSA;  Surgeon: Rozetta Nunnery, MD;  Location: [facility];  Service: ENT;  Laterality: N/A;  . RADICAL NECK DISSECTION Left 03/13/2012   Procedure: RADICAL NECK DISSECTION;  Surgeon: Rozetta Nunnery, MD;  Location: [facility];  Service: ENT;  Laterality: Left;  . TOOTH EXTRACTION N/A 03/13/2012   Procedure: EXTRACTION MOLARS;  Surgeon: Rozetta Nunnery, MD;  Location: [facility];  Service: ENT;  Laterality: N/A;  . TRIGGER FINGER RELEASE Right 02/24/2016   Procedure: RELEASE TRIGGER FINGER/A-1 PULLEY RIGHT THUMB;  Surgeon: Daryll Brod, MD;  Location: [facility];  Service: Orthopedics;  Laterality: Right;  FAB   Family History    Problem Relation Age of Onset  . Stroke Sister   . Diabetes Brother   . Seizures Mother   . Asthma Father   . Thyroid cancer Daughter   . Diabetes Sister   . Parkinson's disease Brother   . Heart disease Brother   . Diabetes Brother   . Colon cancer Neg Hx    Social History   Socioeconomic History  . Marital status: Married    Spouse name: Not on file  . Number of children: 3  . Years of education: Not on file  . Highest education level: Not on file  Occupational History  . 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 Smoking status: Never Smoker  .  Smokeless tobacco: Never Used  Vaping Use  . Vaping Use: Never used  Substance and Sexual Activity  . Alcohol use: No  . Drug use: No  . Sexual activity: Not Currently  Other Topics Concern  . Not on file  Social History Narrative   Married;  2 children in [place], son in [place]; 6 grandchildren      Social Determinants of Health   Financial Resource Strain: Low Risk   . Difficulty of Paying Living Expenses: Not hard at all  Food Insecurity: No Food Insecurity  . Worried About Charity fundraiser in the Last Year: Never true  . Ran Out of Food in the Last Year: Never true  Transportation Needs: No Transportation Needs  . Lack of Transportation (Medical): No  . Lack of Transportation (Non-Medical): No  Physical Activity: Sufficiently Active  . Days of Exercise per Week: 7 days  . Minutes of Exercise per Session: 30 min  Stress: No Stress Concern Present  . Feeling of Stress : Not at all  Social Connections:   . Frequency of Communication with Friends and Family: Not on file  . Frequency of Social Gatherings with Friends and Family: Not on file  . Attends Religious Services: Not on file  . Active Member of Clubs or Organizations: Not on file  . Attends Archivist Meetings: Not on file  . Marital Status: Not on file    Tobacco Counseling Counseling given: Not  Answered   Clinical Intake:  Pre-visit preparation completed: Yes  Pain : No/denies pain     Nutritional Status: BMI > 30  Obese Nutritional Risks: None Diabetes: Yes  How often do you need to have someone help you when you read instructions, pamphlets, or other written materials from your doctor or pharmacy?: 1 - Never What is the last grade level you completed in school?: 12th grade  Diabetic? Yes Nutrition Risk Assessment:  Has the patient had any N/V/D within the last 2 months?  No  Does the patient have any non-healing wounds?  No  Has the patient had any unintentional weight loss or weight gain?  No   Diabetes:  Is the patient diabetic?  Yes  If diabetic, was a CBG obtained today?  No  Did the patient bring in their glucometer from home?  No  How often do you monitor your CBG's? daily.   Financial Strains and Diabetes Management:  Are you having any financial strains with the device, your supplies or your medication? Yes .  Does the patient want to be seen by Chronic Care Management for management of their diabetes?  No  Would the patient like to be referred to a Nutritionist or for Diabetic Management?  No   Diabetic Exams:  Diabetic Eye Exam: Completed 06/10/2019 Diabetic Foot Exam: Completed 10/31/2018  Interpreter Needed?: No  Information entered by :: NAllen LPN   Activities of Daily Living In your present state of health, do you have any difficulty performing the following activities: 09/18/2019  Hearing? Y  Comment tinnitus in left ear  Vision? N  Difficulty concentrating or making decisions? N  Walking or climbing stairs? N  Dressing or bathing? N  Doing errands, shopping? N  Preparing Food and eating ? N  Using the Toilet? N  In the past six months, have you accidently leaked urine? N  Do you have problems with loss of bowel control? N  Managing your Medications? N  Managing your Finances? N  Housekeeping or managing  your Housekeeping? N    Some recent data might be hidden    Patient Care Team: Rodriguez-Southworth, Sandrea Matte as PCP - General (Internal Medicine) Fay Records, MD as PCP - Cardiology (Cardiology) Rozetta Nunnery, MD as Attending Physician (Otolaryngology) Eppie Gibson, MD as Attending Physician (Radiation Oncology) Fay Records, MD as Attending Physician (Cardiology) Inda Castle, MD (Inactive) as Attending Physician (Gastroenterology) Lynne Logan, RN as Case Manager Caudill, Kennieth Francois, [facility] (Pharmacist)  Indicate any recent Medical Services you may have received from other than [facility] providers in the past year (date may be approximate).     Assessment:   This is a routine wellness examination for Va.  Hearing/Vision screen  Hearing Screening   125Hz  250Hz  500Hz  1000Hz  2000Hz  3000Hz  4000Hz  6000Hz  8000Hz   Right ear:           Left ear:           Vision Screening Comments: Regular eye exams, Dr. Posey Pronto  Dietary issues and exercise activities discussed: Current Exercise Habits: Home exercise routine, Type of exercise: Other - see comments;strength training/weights (stationary bike), Time (Minutes): 30, Frequency (Times/Week): 7, Weekly Exercise (Minutes/Week): 210  Goals    .  "I would like to talk to you about my diabetes" (pt-stated)      Current Barriers:  [name] Knowledge Deficits related to disease process and self health management for Diabetes . Chronic Disease Management support and education needs related to DMII, Insomnia, Depression   Nurse Case Manager & PharmD Clinical Goal(s):  . 06/10/19 New Over the next 90 days, patient will continue to work with the CCM team and Endocrinology to increase his knowledge and understanding about the disease process and ways to improve Self Health management of DM  . 06/10/19 New Over the next 90 days, patient will lower his A1c <7.0 %  CCM RN CM Interventions:  06/18/19 Inbound call completed with patient  . Evaluation of current  treatment plan related to DMII and patient's adherence to plan as established by provider . Determined patient's [name] shipment has not yet arrived to the office via [employer]; Coordinated with [place] office staff a sample will be provided . Sent in basket message to embedded Pharm D Jannette Fogo requesting she f/u with [employer] to inquire about patient's [place] shipment . Determined the embedded Pharm D will follow up on the needed documentation in order to get patient's Tresiba shipped to the PCP office  . Determined patient completed his Endocrinology visit with Dr. Kelton Pillar on 05/23/19 with the following Assessment/Plan noted:  o 1) Type 2 Diabetes Mellitus, Poorly controlled, With CKD III complications - Most recent A1c of 9.4 %. Goal A1c < 7.0 %.   o Plan: o GENERAL:  I have discussed with the patient the pathophysiology of diabetes. We went over the natural progression of the disease. We talked about both insulin resistance and insulin deficiency. We stressed the importance of lifestyle changes including diet and exercise. I explained the complications associated with diabetes including retinopathy, nephropathy, neuropathy as well as increased risk of cardiovascular disease. We went over the benefit seen with glycemic control.   I explained to the patient that diabetic patients are at higher than normal risk for amputations.   We discussed the reason for his elevated A1c is post-prandial hyperglycemia and basal insulin is not going to improve that.   Discussed pharmacokinetics of basal/bolus insulin  We also discussed avoiding sugar-sweetened beverages and snacks, when possible.   Pt  is reluctant to try GLP-1 agonists due to severe nausea and vomiting in the past requiring hospitalization  We discussed the limitation of glycemic agents given his CKD  We discussed prandial insulin and the importance of taking it with meals ONLY o MEDICATIONS: o - Decrease Tresiba to 40  units daily  o - Start Novolog 8 units with each meal  o EDUCATION / INSTRUCTIONS:  BG monitoring instructions: Patient is instructed to check his blood sugars 3 times a day, before meals and bedtime .( pt eats 2 meals a day )  Call [facility] clinic if: BG persistently < 70 or > 300.  I reviewed the Rule of 15 for the treatment of hypoglycemia in detail with the patient. Literature supplied. o 2) Diabetic complications:   Eye: Does not have known diabetic retinopathy.   Neuro/ Feet: Does not  have known diabetic peripheral neuropathy.  Renal: Patient does have known baseline CKD. He is on an ACEI/ARB at present o 3) Lipids: Patient is on Simvastatin 40 mg daily. LDL at goal . Discussed cardiovascular benefits  . Determined patient is not satisfied with following the recommendations given by the Endocrinologist and states he plans to allow his PCP to continue managing this condition  . Discussed his next scheduled OV with PCP provider Audery Amel is scheduled for 06/26/19 @3 :30 PM and he plans to further discuss his DM concerns with PCP during this visit  . Discussed plans with patient for ongoing care management follow up and provided patient with direct contact information for care management team  Previously reported by embedded Pharm D . Optimization of DM therapy: o Patient reports throat swelling with Trulicity that resulted in an ED admission.  Will not start GLP1 due to this reported reaction.  Allergy list updated in the EMR o Metformin d/c'd due to kidney function. 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  Will message PCP  Patient Self Care Activities:  . Self administers medications as prescribed . Attends all scheduled provider appointments . Calls pharmacy for medication refills . Performs ADL's  independently . Performs IADL's independently . Calls provider office for new concerns or questions  Please see past updates related to this goal by clicking on the "Past Updates" button in the selected goal       .  "to ease back pain" (pt-stated)      CARE PLAN ENTRY (see longitudinal plan of care for additional care plan information)  Current Barriers:  [name] Knowledge Deficits related to evaluation and treatment of lower back pain  . Chronic Disease Management support and education needs related to Diabetes Mellitus, Insomnia, Depression  Nurse Case Manager Clinical Goal(s):  [name] Over the next 90 days, patient will verbalize understanding of plan for evaluation and treatment of lower back pain   Interventions:  . Inter-disciplinary care team collaboration (see longitudinal plan of care) . Evaluation of current treatment plan related to low back pain and patient's adherence to plan as established by provider . Determined patient followed up with Dr. Tamala Julian at [facility] to evaluate and treat low back pain with the following treatment plan noted:  o Meloxicam o Gabapentin 200mg  o Tart cherry 1200 mg at night o Vit D 2000IU daily o Thoracic Lumbar o See me in 4-5 weeks . Discussed plans with patient for ongoing care  management follow up and provided patient with direct contact information for care management team  Patient Self Care Activities:  . Self administers medications as prescribed . Attends all scheduled provider appointments . Calls pharmacy for medication refills . Calls provider office for new concerns or questions  Initial goal documentation     .  Patient Stated      09/12/2018, no goals    .  Patient Stated      09/18/2019, wants to get rid of injections    .  Pharmacy Care Plan      CARE PLAN ENTRY (see longitudinal plan of care for additional care plan information)  Current Barriers:  . Chronic Disease Management support, education, and care  coordination needs related to Hypertension, Hyperlipidemia, and Diabetes   Hypertension BP Readings from Last 3 Encounters:  06/26/19 134/82  06/11/19 140/90  05/23/19 (!) 144/82   . Pharmacist Clinical Goal(s): o Over the next 180 days, patient will work with PharmD and providers to maintain BP goal <130/80 . Current regimen:  o Enalapril 10mg  daily o Metoprolol succinate 25mg  2 tablets daily . Interventions: o Provided dietary and exercise recommendations . Patient self care activities - Over the next 180 days, patient will: o Check BP daily, document, and provide at future appointments o Ensure daily salt intake < 2300 mg/day o Exercise for 30 minutes daily 5 times per week  Hyperlipidemia Lab Results  Component Value Date/Time   LDLCALC 57 04/17/2019 03:17 PM   . Pharmacist Clinical Goal(s): o Over the next 90 days, patient will work with PharmD and providers to maintain LDL goal < 70 . Current regimen:  o Simvastatin 40mg  daily . Interventions: o Provided dietary and exercise recommendations - Increase healthy fat intake, decrease fatty foods o Recommend recheck thyroid hormones at next PCP appointment . Patient self care activities - Over the next 90 days, patient will: o Increase intake of healthy fats (Avocados, walnuts, flaxseed, etc) o Limit intake of fatty foods, fired, foods, and junk foods o Exercise at least 30 minutes 5 times a week  Diabetes Lab Results  Component Value Date/Time   HGBA1C 8.6 (H) 07/03/2019 04:22 PM   HGBA1C 9.4 (H) 03/27/2019 05:25 PM   . Pharmacist Clinical Goal(s): o Over the next 120 days, patient will work with PharmD and providers to achieve A1c goal <7% . Current regimen:  o Tresiba 40 units daily . Interventions: o Provided dietary and exercise recommendations (mailed handouts to patient) o Discuss addition of Farxiga 5mg  daily with PCP for additional blood glucose control o Assisted with obtaining refill for [place] from  [employer] patient assistance program o Reviewed patient's most recent blood glucose readings . Patient self care activities - Over the next 120 days, patient will: o Check blood sugar once daily, document, and provide at future appointments o Contact provider with any episodes of hypoglycemia o Increase intake of lean protein o Try PLATE method for meal planning  Medication management . Pharmacist Clinical Goal(s): o Over the next 90 days, patient will work with PharmD and providers to maintain optimal medication adherence . Current pharmacy: [facility] . Interventions o Comprehensive medication review performed. o Continue current medication management strategy . Patient self care activities - Over the next 90 days, patient will: o Focus on medication adherence by consider use of a pill box to organize medications o Take medications as prescribed o Report any questions or concerns to PharmD and/or provider(s)  Please see past updates  related to this goal by clicking on the "Past Updates" button in the selected goal        Depression Screen PHQ 2/9 Scores 09/18/2019 03/27/2019 02/17/2019 12/05/2018 09/12/2018 08/08/2018 04/04/2018  PHQ - 2 Score 0 0 0 0 0 0 0  PHQ- 9 Score - - 0 - 0 - -    Fall Risk Fall Risk  09/18/2019 03/27/2019 02/17/2019 12/05/2018 10/31/2018  Falls in the past year? 0 1 1 0 0  Number falls in past yr: - 0 0 - -  Injury with Fall? - 1 1 - -  Comment - - the pt tripped over his shoes and fell - -  Risk for fall due to : Medication side effect - - - -  Follow up Falls evaluation completed;Education provided;Falls prevention discussed - - - -    Any stairs in or around the home? Yes  If so, are there any without handrails? No  Home free of loose throw rugs in walkways, pet beds, electrical cords, etc? Yes  Adequate lighting in your home to reduce risk of falls? Yes   ASSISTIVE DEVICES UTILIZED TO PREVENT FALLS:  Life alert? No  Use of a cane, walker or  w/c? No  Grab bars in the bathroom? No  Shower chair or bench in shower? No  Elevated toilet seat or a handicapped toilet? No   TIMED UP AND GO:  Was the test performed? No .   Gait steady and fast without use of assistive device  Cognitive Function: MMSE - Mini Mental State Exam 10/23/2017  Orientation to time 5  Orientation to Place 5  Registration 3  Attention/ Calculation 5  Recall 3  Language- name 2 objects 2  Language- repeat 1  Language- follow 3 step command 3  Language- read & follow direction 1  Write a sentence 1  Copy design 1  Total score 30     6CIT Screen 09/18/2019 09/12/2018  What Year? 0 points 0 points  What month? 0 points 0 points  What time? 0 points 0 points  Count back from 20 0 points 0 points  Months in reverse 0 points -  Repeat phrase 2 points 0 points  Total Score 2 -    Immunizations Immunization History  Administered Date(s) Administered  . Influenza Split 11/24/2010, 12/06/2011  . Influenza, High Dose Seasonal PF 11/17/2014, 10/23/2017, 12/05/2018  . Influenza,inj,Quad PF,6+ Mos 11/25/2012, 01/01/2014  . Pneumococcal Conjugate-13 01/07/2015  . Pneumococcal Polysaccharide-23 10/21/2009  . Tdap 05/27/2009    TDAP status: Due, Education has been provided regarding the importance of this vaccine. Advised may receive this vaccine at local pharmacy or Health Dept. Aware to provide a copy of the vaccination record if obtained from local pharmacy or Health Dept. Verbalized acceptance and understanding. Flu Vaccine status: Up to date Pneumococcal vaccine status: Up to date Covid-19 vaccine status: Declined, Education has been provided regarding the importance of this vaccine but patient still declined. Advised may receive this vaccine at local pharmacy or Health Dept.or vaccine clinic. Aware to provide a copy of the vaccination record if obtained from local pharmacy or Health Dept. Verbalized acceptance and understanding.  Qualifies for  Shingles Vaccine? Yes   Zostavax completed No   Shingrix Completed?: No.    Education has been provided regarding the importance of this vaccine. Patient has been advised to call insurance company to determine out of pocket expense if they have not yet received this vaccine. Advised may also receive vaccine at  local pharmacy or Health Dept. Verbalized acceptance and understanding.  Screening Tests Health Maintenance  Topic Date Due  . INFLUENZA VACCINE  08/24/2019  . COVID-19 Vaccine (1) 10/04/2019 (Originally 09/11/1962)  . PNA vac Low Risk Adult (2 of 2 - PPSV23) 02/27/2020 (Originally 01/07/2016)  . TETANUS/TDAP  09/17/2020 (Originally 05/28/2019)  . FOOT EXAM  10/31/2019  . HEMOGLOBIN A1C  01/02/2020  . OPHTHALMOLOGY EXAM  06/09/2020  . COLONOSCOPY  05/31/2022  . Hepatitis C Screening  Completed    Health Maintenance  Health Maintenance Due  Topic Date Due  . INFLUENZA VACCINE  08/24/2019    Colorectal cancer screening: Completed 06/09/2017. Repeat every 5 years    Lung Cancer Screening: (Low Dose CT Chest recommended if Age 70-80 years, 30 pack-year currently smoking OR have quit w/in 15years.) does not qualify.   Lung Cancer Screening Referral: no  Additional Screening:  Hepatitis C Screening: does qualify; Completed 04/19/2015  Vision Screening: Recommended annual ophthalmology exams for early detection of glaucoma and other disorders of the eye. Is the patient up to date with their annual eye exam?  Yes  Who is the provider or what is the name of the office in which the patient attends annual eye exams? Dr. Posey Pronto If pt is not established with a provider, would they like to be referred to a provider to establish care? No .   Dental Screening: Recommended annual dental exams for proper oral hygiene  Community Resource Referral / Chronic Care Management: CRR required this visit?  No   CCM required this visit?  No      Plan:     I have personally reviewed and noted  the following in the patient's chart:   . Medical and social history . Use of alcohol, tobacco or illicit drugs  . Current medications and supplements . Functional ability and status . Nutritional status . Physical activity . Advanced directives . List of other physicians . Hospitalizations, surgeries, and ER visits in previous 12 months . Vitals . Screenings to include cognitive, depression, and falls . Referrals and appointments  In addition, I have reviewed and discussed with patient certain preventive protocols, quality metrics, and best practice recommendations. A written personalized care plan for preventive services as well as general preventive health recommendations were provided to patient.     Kellie Simmering, LPN   [id number]   Nurse Notes:

## 2019-09-18 NOTE — Progress Notes (Signed)
This visit occurred during the SARS-CoV-2 public health emergency.  Safety protocols were in place, including screening questions prior to the visit, additional usage of staff PPE, and extensive cleaning of exam room while observing appropriate contact time as indicated for disinfecting solutions.  Subjective:     Patient ID: Gregory Friedman , male    DOB: [birth year] , 69 y.o.   MRN: [id number]   Chief Complaint  Patient presents with  . Diabetes    HPI  He has seen an Endocrinologist in the past once but did not go back after he was encouraged to take insulin for his diabetes when he eats and he refused. He reports he continues to have intermittent hot flashes.     Diabetes He presents for his follow-up diabetic visit. He has type 2 diabetes mellitus. There are no hypoglycemic associated symptoms. Pertinent negatives for hypoglycemia include no dizziness or headaches. There are no diabetic associated symptoms. There are no hypoglycemic complications. There are no diabetic complications. Risk factors for coronary artery disease include obesity, male sex and hypertension. Current diabetic treatment includes oral agent (monotherapy). He is compliant with treatment all of the time. He is following a generally healthy diet. When asked about meal planning, he reported none. He has not had a previous visit with a dietitian. He participates in exercise every other day. There is no change in his home blood glucose trend. An ACE inhibitor/angiotensin II receptor blocker is being taken. He sees a podiatrist.Eye exam is current (May 2021).     Past Medical History:  Diagnosis Date  . Anxiety   . BPH (benign prostatic hyperplasia)   . Cancer ([place]) 2014   neck area  . Colon polyps   . Coronary artery disease   . Depression   . Diverticulosis   . GERD (gastroesophageal reflux disease)   . History of gout    toe  . 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 Hyperlipidemia    . Hypertension    states under control with med., has been on med. since 2003  . 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 Myocardial infarction ([place]) 2003  . Non-insulin dependent type 2 diabetes mellitus ([place])   . PONV (postoperative nausea and vomiting)   . S/P radiation therapy    Received 8 fractions, then stopped due to mucositis with Radiation/ Cisplatin  . Status post dilation of esophageal narrowing   . Trigger thumb of right hand 01/2016     Family History  Problem Relation Age of Onset  . Stroke Sister   . Diabetes Brother   . Seizures Mother   . Asthma Father   . Thyroid cancer Daughter   . Diabetes Sister   . Parkinson's disease Brother   . Heart disease Brother   . Diabetes Brother   . Colon cancer Neg Hx      Current Outpatient Medications:  .  Accu-Chek Softclix Lancets lancets, USE AS DIRECTED, Disp: 300 each, Rfl: 1 .  aspirin EC 81 MG tablet, Take 81 mg by mouth at bedtime. , Disp: , Rfl:  .  Coenzyme Q10 200 MG capsule, Take 200 mg by mouth at bedtime. , Disp: , Rfl:  .  enalapril (VASOTEC) 10 MG tablet, TAKE 1 TABLET EVERY DAY, Disp: 90 tablet, Rfl: 1 .  glucose blood (ACCU-CHEK GUIDE) test strip, 1 each by Other route 2 (two) times daily.  Use as instructed, Disp: 200 each, Rfl: 12 .  insulin degludec (TRESIBA FLEXTOUCH) 100 UNIT/ML FlexTouch Pen, Inject 0.4 mLs (40 Units total) into the skin daily. 55 unites per day (Patient taking differently: Inject 40 Units into the skin daily. ), Disp: 30 mL, Rfl: 3 .  Insulin Pen Needle 32G X 4 MM MISC, 1 Device by Does not apply route in the morning, at noon, in the evening, and at bedtime., Disp: 400 each, Rfl: 3 .  Krill Oil (OMEGA-3) 500 MG CAPS, Take 1 capsule by mouth daily., Disp: , Rfl:  .  meclizine (ANTIVERT) 25 MG tablet, TAKE 1 TABLET THREE TIMES DAILY FOR DIZZINESS (Patient taking differently: As needed), Disp: 180 tablet, Rfl: 1 .  melatonin 1 MG  TABS tablet, Take 1 mg by mouth at bedtime., Disp: , Rfl:  .  metoprolol succinate (TOPROL-XL) 50 MG 24 hr tablet, TAKE 1 TABLET EVERY DAY (NEW DIRECTIONS), Disp: 90 tablet, Rfl: 1 .  Misc Natural Products (TART CHERRY ADVANCED PO), Take by mouth., Disp: , Rfl:  .  nitroGLYCERIN (NITROSTAT) 0.4 MG SL tablet, Place 1 tablet (0.4 mg total) under the tongue every 5 (five) minutes as needed. Chest pain, Disp: 25 tablet, Rfl: 3 .  omeprazole (PRILOSEC) 40 MG capsule, TAKE 1 CAPSULE EVERY DAY BEFORE A MEAL (Patient taking differently: Take 40 mg by mouth at bedtime. ), Disp: 90 capsule, Rfl: 0 .  Saw Palmetto, Serenoa repens, 320 MG CAPS, Take 320 mg by mouth at bedtime. , Disp: , Rfl:  .  simvastatin (ZOCOR) 40 MG tablet, TAKE 1 TABLET  EVERY EVENING., Disp: 90 tablet, Rfl: 1 .  triamcinolone cream (KENALOG) 0.1 %, Apply 1 application topically 3 (three) times daily. Apply to rash bid x 7-10 days, then prn when rash return, Disp: 80 g, Rfl: 1   No Known Allergies   Review of Systems  Constitutional: Negative.   Respiratory: Negative for cough and shortness of breath.   Cardiovascular: Negative.   Gastrointestinal: Negative.   Musculoskeletal: Negative.   Skin: Negative.   Neurological: Negative for dizziness and headaches.  Psychiatric/Behavioral: Negative.      Today's Vitals   09/18/19 1429  BP: (!) 128/98  Pulse: (!) 54  Temp: 98 F (36.7 C)  TempSrc: Oral  Weight: 193 lb 12.6 oz (87.9 kg)  Height: 5\' 6"  (1.676 m)  PainSc: 0-No pain   Body mass index is 31.28 kg/m.   Objective:  Physical Exam Vitals reviewed.  Constitutional:      Appearance: Normal appearance. He is obese.  Cardiovascular:     Rate and Rhythm: Normal rate and regular rhythm.     Pulses: Normal pulses.     Heart sounds: Normal heart sounds. No murmur heard.   Pulmonary:     Effort: No respiratory distress.     Breath sounds: Normal breath sounds.  Musculoskeletal:        General: No swelling or  tenderness. Normal range of motion.  Skin:    General: Skin is warm and dry.     Capillary Refill: Capillary refill takes less than 2 seconds.  Neurological:     General: No focal deficit present.     Mental Status: He is alert and oriented to person, place, and time.  Psychiatric:        Mood and Affect: Mood normal.        Behavior: Behavior normal.        Thought Content: Thought content normal.  Judgment: Judgment normal.         Assessment And Plan:     1. Type 2 diabetes mellitus with hyperglycemia, with long-term current use of insulin (HCC)   Chronic, poorly controlled  Continue with current medications and follow up with Endocrinology - he is not interested in taking insulin at this time.  Encouraged to limit intake of sugary foods and drinks  Encouraged to increase physical activity to 150 minutes per week  2. Essential hypertension . B/P is controlled.  . CMP ordered to check renal function.  . The importance of regular exercise and dietary modification was stressed to the patient.   3. Dyslipidemia  Chronic, controlled  Continue with current medications, tolerating well.         Patient was given opportunity to ask questions. Patient verbalized understanding of the plan and was able to repeat key elements of the plan. All questions were answered to their satisfaction.  Minette Brine, FNP   I, Minette Brine, FNP, have reviewed all documentation for this visit. The documentation on 10/07/19 for the exam, diagnosis, procedures, and orders are all accurate and complete.  THE PATIENT IS ENCOURAGED TO PRACTICE SOCIAL DISTANCING DUE TO THE COVID-19 PANDEMIC.

## 2019-09-19 LAB — CMP14+EGFR
ALT: 16 IU/L (ref 0–44)
AST: 20 IU/L (ref 0–40)
Albumin/Globulin Ratio: 1.7 (ref 1.2–2.2)
Albumin: 5 g/dL — ABNORMAL HIGH (ref 3.8–4.8)
Alkaline Phosphatase: 61 IU/L (ref 48–121)
BUN/Creatinine Ratio: 11 (ref 10–24)
BUN: 16 mg/dL (ref 8–27)
Bilirubin Total: 0.7 mg/dL (ref 0.0–1.2)
CO2: 27 mmol/L (ref 20–29)
Calcium: 9.7 mg/dL (ref 8.6–10.2)
Chloride: 99 mmol/L (ref 96–106)
Creatinine, Ser: 1.48 mg/dL — ABNORMAL HIGH (ref 0.76–1.27)
GFR calc Af Amer: 55 mL/min/{1.73_m2} — ABNORMAL LOW (ref >59)
GFR calc non Af Amer: 47 mL/min/{1.73_m2} — ABNORMAL LOW (ref >59)
Globulin, Total: 2.9 g/dL (ref 1.5–4.5)
Glucose: 197 mg/dL — ABNORMAL HIGH (ref 65–99)
Potassium: 4.3 mmol/L (ref 3.5–5.2)
Sodium: 139 mmol/L (ref 134–144)
Total Protein: 7.9 g/dL (ref 6.0–8.5)

## 2019-09-19 LAB — HEMOGLOBIN A1C
Est. average glucose Bld gHb Est-mCnc: 220 mg/dL
Hgb A1c MFr Bld: 9.3 % — ABNORMAL HIGH (ref 4.8–5.6)

## 2019-09-19 LAB — LIPID PANEL
Chol/HDL Ratio: 3.5 ratio (ref 0.0–5.0)
Cholesterol, Total: 152 mg/dL (ref 100–199)
HDL: 44 mg/dL (ref >39)
LDL Chol Calc (NIH): 85 mg/dL (ref 0–99)
Triglycerides: 132 mg/dL (ref 0–149)
VLDL Cholesterol Cal: 23 mg/dL (ref 5–40)

## 2019-09-25 ENCOUNTER — Telehealth: Payer: Medicare HMO

## 2019-09-25 ENCOUNTER — Telehealth: Payer: Self-pay

## 2019-09-25 NOTE — Telephone Encounter (Cosign Needed)
  Chronic Care Management   Outreach Note  09/25/2019 Name: Gregory Friedman MRN: [id number] DOB: 1950-11-08  Referred by: Shelby Mattocks, PA-C Reason for referral : Chronic Care Management (FU RN CM Call )   An unsuccessful telephone outreach was attempted today. The patient was referred to the case management team for assistance with care management and care coordination.   Follow Up Plan: Telephone follow up appointment with care management team member scheduled for: 10/29/19  Barb Merino, RN, BSN, CCM Care Management Coordinator [place] [facility]  Direct Phone: [phone]

## 2019-10-06 ENCOUNTER — Other Ambulatory Visit: Payer: Self-pay | Admitting: Internal Medicine

## 2019-10-29 ENCOUNTER — Other Ambulatory Visit: Payer: Self-pay

## 2019-10-29 ENCOUNTER — Ambulatory Visit: Payer: Self-pay

## 2019-10-29 ENCOUNTER — Telehealth: Payer: Medicare HMO

## 2019-10-29 DIAGNOSIS — F325 Major depressive disorder, single episode, in full remission: Secondary | ICD-10-CM

## 2019-10-29 DIAGNOSIS — E1165 Type 2 diabetes mellitus with hyperglycemia: Secondary | ICD-10-CM

## 2019-10-29 DIAGNOSIS — F5101 Primary insomnia: Secondary | ICD-10-CM

## 2019-10-31 ENCOUNTER — Telehealth: Payer: Self-pay | Admitting: Pharmacist

## 2019-10-31 NOTE — Chronic Care Management (AMB) (Signed)
Chronic Care Management Pharmacy Assistant   Name: Gregory Friedman  MRN: [id number] DOB: 1950-03-31  Reason for Encounter: Medication Review - Appointment Follow  Up.   Allergies:  No Known Allergies  Medications: Outpatient Encounter Medications as of 10/31/2019  Medication Sig  . Accu-Chek Softclix Lancets lancets USE AS DIRECTED  . aspirin EC 81 MG tablet Take 81 mg by mouth at bedtime.   . Coenzyme Q10 200 MG capsule Take 200 mg by mouth at bedtime.   . enalapril (VASOTEC) 10 MG tablet TAKE 1 TABLET EVERY DAY  . glucose blood (ACCU-CHEK GUIDE) test strip 1 each by Other route 2 (two) times daily. Use as instructed  . insulin degludec (TRESIBA FLEXTOUCH) 100 UNIT/ML FlexTouch Pen Inject 0.4 mLs (40 Units total) into the skin daily. 55 unites per day (Patient taking differently: Inject 40 Units into the skin daily. )  . Insulin Pen Needle 32G X 4 MM MISC 1 Device by Does not apply route in the morning, at noon, in the evening, and at bedtime.  Gregory Friedman Oil (OMEGA-3) 500 MG CAPS Take 1 capsule by mouth daily.  . meclizine (ANTIVERT) 25 MG tablet TAKE 1 TABLET THREE TIMES DAILY FOR DIZZINESS  . melatonin 1 MG TABS tablet Take 1 mg by mouth at bedtime.  . metoprolol succinate (TOPROL-XL) 50 MG 24 hr tablet TAKE 1 TABLET EVERY DAY (NEW DIRECTIONS)  . Misc Natural Products (TART CHERRY ADVANCED PO) Take by mouth.  . nitroGLYCERIN (NITROSTAT) 0.4 MG SL tablet Place 1 tablet (0.4 mg total) under the tongue every 5 (five) minutes as needed. Chest pain  . omeprazole (PRILOSEC) 40 MG capsule TAKE 1 CAPSULE EVERY DAY BEFORE A MEAL (Patient taking differently: Take 40 mg by mouth at bedtime. )  . Saw Palmetto, Serenoa repens, 320 MG CAPS Take 320 mg by mouth at bedtime.   . simvastatin (ZOCOR) 40 MG tablet TAKE 1 TABLET  EVERY EVENING.  [name] triamcinolone cream (KENALOG) 0.1 % Apply 1 application topically 3 (three) times daily. Apply to rash bid x 7-10 days, then prn when rash return   No  facility-administered encounter medications on file as of 10/31/2019.    Current Diagnosis: Patient Active Problem List   Diagnosis Date Noted  . Low back pain 06/11/2019  . Type 2 diabetes mellitus with hyperglycemia, with long-term current use of insulin ([place]) 05/23/2019  . Type 2 diabetes mellitus with stage 3a chronic kidney disease, with long-term current use of insulin ([place]) 05/23/2019  . Dyslipidemia 05/23/2019  . Psoriasis 12/12/2018  . Skin lesion 10/31/2018  . Diabetes mellitus type 2, insulin dependent ([place]) 09/12/2018  . Elevated serum creatinine 09/12/2018  . Hypothyroidism 10/10/2017  . Atherosclerosis of aorta ([place]) 10/10/2017  . Unspecified skin changes 10/10/2017  . Other long term (current) drug therapy 10/10/2017  . Abdominal pain 09/21/2017  . Nausea & vomiting 09/21/2017  . History of head and neck cancer 09/21/2017  . History of colonic polyps 04/04/2016  . Dysphagia 04/04/2016  . Bloating 04/04/2016  . Diarrhea 04/04/2016  . Trigger finger of right thumb 08/23/2015  . Chest pain 07/15/2015  . Pain in the chest   . S/P CABG (coronary artery bypass graft)   . Noncompliance with medications 07/08/2015  . Acute gout 04/19/2015  . GERD (gastroesophageal reflux disease) 05/14/2014  . Depression, major, in remission ([place]) 03/27/2013  . Allergic rhinitis, cause unspecified 03/27/2013  . 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 Depressive disorder, not elsewhere classified  12/06/2011  . Nonspecific (abnormal) findings on radiological and other examination of gastrointestinal tract 02/10/2011  . Dysphagia, unspecified(787.20) 02/10/2011  . Palpitations 02/02/2011  . Dizziness 07/15/2010  . DIAB W/OPHTH MANIFESTS TYPE I [JUV TYPE] UNCNTRL 08/02/2009  . Mixed hyperlipidemia 08/02/2009  . Essential hypertension 08/02/2009  . CAD (coronary artery disease) 08/02/2009   10/31/2019 - Called patient  to reschedule his appointment he had with Jannette Fogo, CPP on 11/05/2019. Patient was telling me about his frustrations  with his diabetic medication, so I told him that I would check with Darrick Meigs Davis,CPP and call him back, because we wanted to be able to help him.  Called Patient per Beverly Milch, to let him know that we were able to move his appointment to 11/03/2019, so we could help him sooner , patient states how happy this made him. Patient is a great candidate for [facility].   Follow-Up:  Pharmacist Review -    Christian Davis,CPP. Notified   Judithann Sheen, [place] Pharmacist Assistant [phone]

## 2019-10-31 NOTE — Patient Instructions (Addendum)
Visit Information  Goals Addressed      Patient Stated   .  "I would like to talk to you about my diabetes" (pt-stated)        Current Barriers:  [name] Knowledge Deficits related to disease process and self health management for Diabetes . Chronic Disease Management support and education needs related to DMII, Insomnia, Depression   Nurse Case Manager & PharmD Clinical Goal(s):  . 10/29/19 New Over the next 180 days, patient will continue to work with the CCM team and PCP for disease education and support for improved Self Health management of DM  CCM RN CM Interventions:  10/29/19 Inbound call completed with patient  . Evaluation of current treatment plan related to DMII and patient's adherence to plan as established by provider . Determined patient is no longer f/u with Endocrinology, he prefers to have PCP and embedded Pharm D manage his DM . Educated patient on current A1c is up to 9.3 from 8.6, Educated on dietary and exercise recommendations; Educated on goal A1c <7.5; Educated on daily glycemic control, FBS 80-130, <180 after meals; Educated on 15'15 rule . Determined patient is not consistent with taking [name], states he forgets to take it due to having a weekly injection . Determined patient would like to resume taking Glipizide in which he had taken in the past or try another oral medication for DM, he wishes to d/c the [place] . Sent in basket message to PCP and embedded Pharm D to discuss patient's request to d/c [place] and switch to oral medication  . Mailed printed educational material related to Diabetes Care Schedule; Preventing Complications from Diabetes; Grocery Shopping with Diabetes . Discussed plans with patient for ongoing care management follow up and provided patient with direct contact information for care management team  Patient Self Care Activities:  . Self administers medications as prescribed . Attends all scheduled provider appointments . Calls pharmacy for  medication refills . Performs ADL's independently . Performs IADL's independently . Calls provider office for new concerns or questions  Please see past updates related to this goal by clicking on the "Past Updates" button in the selected goal        Patient verbalizes understanding of instructions provided today.   Telephone follow up appointment with care management team member scheduled for: 01/05/20  Barb Merino, RN, BSN, CCM Care Management Coordinator [place] [facility]  Direct Phone: [phone]

## 2019-10-31 NOTE — Chronic Care Management (AMB) (Signed)
Chronic Care Management   Follow Up Note   10/29/2019 Name: Gregory Friedman MRN: [id number] DOB: 1951/01/13  Referred by: Shelby Mattocks, PA-C Reason for referral : Chronic Care Management (CCM RN CM FU Call )   Gregory Friedman is a 69 y.o. year old male who is a primary care patient of [place], Sunday Spillers, [place]. The CCM team was consulted for assistance with chronic disease management and care coordination needs.    Review of patient status, including review of consultants reports, relevant laboratory and other test results, and collaboration with appropriate care team members and the patient's provider was performed as part of comprehensive patient evaluation and provision of chronic care management services.    SDOH (Social Determinants of Health) assessments performed: Yes - no acute challenges identified at this time  See Care Plan activities for detailed interventions related to [place])   Placed outbound CCM RN CM follow up call to patient for a care plan update.     Outpatient Encounter Medications as of 10/29/2019  Medication Sig  . Accu-Chek Softclix Lancets lancets USE AS DIRECTED  . aspirin EC 81 MG tablet Take 81 mg by mouth at bedtime.   . Coenzyme Q10 200 MG capsule Take 200 mg by mouth at bedtime.   . enalapril (VASOTEC) 10 MG tablet TAKE 1 TABLET EVERY DAY  . glucose blood (ACCU-CHEK GUIDE) test strip 1 each by Other route 2 (two) times daily. Use as instructed  . insulin degludec (TRESIBA FLEXTOUCH) 100 UNIT/ML FlexTouch Pen Inject 0.4 mLs (40 Units total) into the skin daily. 55 unites per day (Patient taking differently: Inject 40 Units into the skin daily. )  . Insulin Pen Needle 32G X 4 MM MISC 1 Device by Does not apply route in the morning, at noon, in the evening, and at bedtime.  [name] (OMEGA-3) 500 MG CAPS Take 1 capsule by mouth daily.  . meclizine (ANTIVERT) 25 MG tablet TAKE 1 TABLET THREE TIMES DAILY FOR DIZZINESS  .  melatonin 1 MG TABS tablet Take 1 mg by mouth at bedtime.  . metoprolol succinate (TOPROL-XL) 50 MG 24 hr tablet TAKE 1 TABLET EVERY DAY (NEW DIRECTIONS)  . Misc Natural Products (TART CHERRY ADVANCED PO) Take by mouth.  . nitroGLYCERIN (NITROSTAT) 0.4 MG SL tablet Place 1 tablet (0.4 mg total) under the tongue every 5 (five) minutes as needed. Chest pain  . omeprazole (PRILOSEC) 40 MG capsule TAKE 1 CAPSULE EVERY DAY BEFORE A MEAL (Patient taking differently: Take 40 mg by mouth at bedtime. )  . Saw Palmetto, Serenoa repens, 320 MG CAPS Take 320 mg by mouth at bedtime.   . simvastatin (ZOCOR) 40 MG tablet TAKE 1 TABLET  EVERY EVENING.  [name] triamcinolone cream (KENALOG) 0.1 % Apply 1 application topically 3 (three) times daily. Apply to rash bid x 7-10 days, then prn when rash return   No facility-administered encounter medications on file as of 10/29/2019.     Objective:  Lab Results  Component Value Date   HGBA1C 9.3 (H) 09/18/2019   HGBA1C 8.6 (H) 07/03/2019   HGBA1C 9.4 (H) 03/27/2019   Lab Results  Component Value Date   MICROALBUR 10 10/31/2018   LDLCALC 85 09/18/2019   CREATININE 1.48 (H) 09/18/2019   BP Readings from Last 3 Encounters:  09/18/19 128/78  09/18/19 (!) 128/98  06/26/19 134/82    Goals Addressed      Patient Stated   .  "I would like to talk to you about my  diabetes" (pt-stated)        Current Barriers:  [name] Knowledge Deficits related to disease process and self health management for Diabetes . Chronic Disease Management support and education needs related to DMII, Insomnia, Depression   Nurse Case Manager & PharmD Clinical Goal(s):  . 10/29/19 New Over the next 180 days, patient will continue to work with the CCM team and PCP for disease education and support for improved Self Health management of DM  CCM RN CM Interventions:  10/29/19 Inbound call completed with patient  . Evaluation of current treatment plan related to DMII and patient's adherence to  plan as established by provider . Determined patient is no longer f/u with Endocrinology, he prefers to have PCP and embedded Pharm D manage his DM . Educated patient on current A1c is up to 9.3 from 8.6, Educated on dietary and exercise recommendations; Educated on goal A1c <7.5; Educated on daily glycemic control, FBS 80-130, <180 after meals; Educated on 15'15 rule . Determined patient is not consistent with taking [name], states he forgets to take it due to having a weekly injection . Determined patient would like to resume taking Glipizide in which he had taken in the past or try another oral medication for DM, he wishes to d/c the [place] . Sent in basket message to PCP and embedded Pharm D to discuss patient's request to d/c [place] and switch to oral medication  . Mailed printed educational material related to Diabetes Care Schedule; Preventing Complications from Diabetes; Grocery Shopping with Diabetes . Discussed plans with patient for ongoing care management follow up and provided patient with direct contact information for care management team  Patient Self Care Activities:  . Self administers medications as prescribed . Attends all scheduled provider appointments . Calls pharmacy for medication refills . Performs ADL's independently . Performs IADL's independently . Calls provider office for new concerns or questions  Please see past updates related to this goal by clicking on the "Past Updates" button in the selected goal        Plan:   Telephone follow up appointment with care management team member scheduled for: 01/05/20  Barb Merino, RN, BSN, CCM Care Management Coordinator [place] [facility]  Direct Phone: [phone]

## 2019-11-03 ENCOUNTER — Ambulatory Visit: Payer: Medicare HMO | Admitting: Pharmacist

## 2019-11-03 DIAGNOSIS — N1831 Chronic kidney disease, stage 3a: Secondary | ICD-10-CM

## 2019-11-03 NOTE — Progress Notes (Signed)
Chronic Care Management   Follow Up Note   11/03/2019 Name: Gregory Friedman MRN: [id number] DOB: 1950/03/27  Referred by: Shelby Mattocks, PA-C Reason for referral : Chronic Care Management (PharmD follow up)   Gregory Friedman is a 69 y.o. year old male who is a primary care patient of [place], Sunday Spillers, [place]. The CCM team was consulted for assistance with chronic disease management and care coordination needs.    Review of patient status, including review of consultants reports, relevant laboratory and other test results, and collaboration with appropriate care team members and the patient's provider was performed as part of comprehensive patient evaluation and provision of chronic care management services.    SDOH (Social Determinants of Health) assessments performed: No See Care Plan activities for detailed interventions related to [facility])     Outpatient Encounter Medications as of 11/03/2019  Medication Sig  . Accu-Chek Softclix Lancets lancets USE AS DIRECTED  . aspirin EC 81 MG tablet Take 81 mg by mouth at bedtime.   . Coenzyme Q10 200 MG capsule Take 200 mg by mouth at bedtime.   . enalapril (VASOTEC) 10 MG tablet TAKE 1 TABLET EVERY DAY  . glucose blood (ACCU-CHEK GUIDE) test strip 1 each by Other route 2 (two) times daily. Use as instructed  . insulin degludec (TRESIBA FLEXTOUCH) 100 UNIT/ML FlexTouch Pen Inject 0.4 mLs (40 Units total) into the skin daily. 55 unites per day (Patient taking differently: Inject 40 Units into the skin daily. )  . Insulin Pen Needle 32G X 4 MM MISC 1 Device by Does not apply route in the morning, at noon, in the evening, and at bedtime.  [name] (OMEGA-3) 500 MG CAPS Take 1 capsule by mouth daily.  . meclizine (ANTIVERT) 25 MG tablet TAKE 1 TABLET THREE TIMES DAILY FOR DIZZINESS  . melatonin 1 MG TABS tablet Take 1 mg by mouth at bedtime.  . metoprolol succinate (TOPROL-XL) 50 MG 24 hr tablet TAKE 1 TABLET EVERY  DAY (NEW DIRECTIONS)  . Misc Natural Products (TART CHERRY ADVANCED PO) Take by mouth.  . nitroGLYCERIN (NITROSTAT) 0.4 MG SL tablet Place 1 tablet (0.4 mg total) under the tongue every 5 (five) minutes as needed. Chest pain  . omeprazole (PRILOSEC) 40 MG capsule TAKE 1 CAPSULE EVERY DAY BEFORE A MEAL (Patient taking differently: Take 40 mg by mouth at bedtime. )  . Saw Palmetto, Serenoa repens, 320 MG CAPS Take 320 mg by mouth at bedtime.   . simvastatin (ZOCOR) 40 MG tablet TAKE 1 TABLET  EVERY EVENING.  [name] triamcinolone cream (KENALOG) 0.1 % Apply 1 application topically 3 (three) times daily. Apply to rash bid x 7-10 days, then prn when rash return   No facility-administered encounter medications on file as of 11/03/2019.     Objective:   Goals Addressed            This Visit's Progress   . Pharmacy Care Plan       CARE PLAN ENTRY (see longitudinal plan of care for additional care plan information)  Current Barriers:  . Chronic Disease Management support, education, and care coordination needs related to Hypertension, Hyperlipidemia, and Diabetes   Hypertension BP Readings from Last 3 Encounters:  06/26/19 134/82  06/11/19 140/90  05/23/19 (!) 144/82   . Pharmacist Clinical Goal(s): o Over the next 180 days, patient will work with PharmD and providers to maintain BP goal <130/80 . Current regimen:  o Enalapril 10mg  daily o Metoprolol succinate 25mg  2 tablets daily .  Interventions: o Provided dietary and exercise recommendations . Patient self care activities - Over the next 180 days, patient will: o Check BP daily, document, and provide at future appointments o Ensure daily salt intake < 2300 mg/day o Exercise for 30 minutes daily 5 times per week  Hyperlipidemia Lab Results  Component Value Date/Time   LDLCALC 57 04/17/2019 03:17 PM   . Pharmacist Clinical Goal(s): o Over the next 90 days, patient will work with PharmD and providers to maintain LDL goal <  70 . Current regimen:  o Simvastatin 40mg  daily . Interventions: o Provided dietary and exercise recommendations - Increase healthy fat intake, decrease fatty foods o Recommend recheck thyroid hormones at next PCP appointment . Patient self care activities - Over the next 90 days, patient will: o Increase intake of healthy fats (Avocados, walnuts, flaxseed, etc) o Limit intake of fatty foods, fired, foods, and junk foods o Exercise at least 30 minutes 5 times a week  Diabetes Lab Results  Component Value Date/Time   HGBA1C 8.6 (H) 07/03/2019 04:22 PM   HGBA1C 9.4 (H) 03/27/2019 05:25 PM   . Pharmacist Clinical Goal(s): o Over the next 120 days, patient will work with PharmD and providers to achieve A1c goal <7% . Current regimen:  o Tresiba 40 units daily . Interventions: o Provided dietary and exercise recommendations (mailed handouts to patient) o Discuss titration of Tresiba to 45 units daily and reassess in two weeks. o Assisted with obtaining refill for Tresiba from [employer] patient assistance program o Reviewed patient's most recent blood glucose readings . Patient self care activities - Over the next 60 days, patient will: o Check blood sugar once daily, document, and provide at future appointments o Contact provider with any episodes of hypoglycemia o Increase intake of lean protein o Try PLATE method for meal planning  Medication management . Pharmacist Clinical Goal(s): o Over the next 90 days, patient will work with PharmD and providers to maintain optimal medication adherence . Current pharmacy: [facility] . Interventions o Comprehensive medication review performed. o Continue current medication management strategy . Patient self care activities - Over the next 90 days, patient will: o Focus on medication adherence by consider use of a pill box to organize medications o Take medications as prescribed o Report any questions or concerns to PharmD and/or  provider(s)  Please see past updates related to this goal by clicking on the "Past Updates" button in the selected goal         Diabetes   A1c goal <7%  Recent Relevant Labs: Lab Results  Component Value Date/Time   HGBA1C 9.3 (H) 09/18/2019 04:26 PM   HGBA1C 8.6 (H) 07/03/2019 04:22 PM   MICROALBUR 10 10/31/2018 10:57 AM   MICROALBUR 1.0 04/19/2015 12:01 AM   MICROALBUR 0.2 05/14/2014 12:01 AM    Last diabetic Eye exam:  Lab Results  Component Value Date/Time   HMDIABEYEEXA No Retinopathy 09/14/2015 12:00 AM    Last diabetic Foot exam: No results found for: HMDIABFOOTEX   Checking BG: 2x per Day  Recent FBG Readings: 88-155 per patient, with most numbers being > 130, he is not checking his sugars any other time   Patient has failed these meds in past: Metformin- due to low GFR , Glyburide- hypoglycemia, Trulicity - throat swelling Patient is currently uncontrolled on the following medications: . [name] 100u/mL 40 units daily  He is very discouraged with his blood sugar levels and his regimen.  He is only  using [place] and no rapid acting insulin.  He denies missing any doses but may take them a few hours late.  He mentions Rybelsus but I would hesitate due to past reaction with Trulicity.  He was previously on 55 units and was decreased down to 40 at the time he started on fast acting insulin, but has since d/c.  Would recommend up titration to 45 units and assess numbers in two weeks.  Plan  Continue current medications, recommend increase of long acting insulin [name]) to 45 units and reassess in two weeks.  CKD   Kidney Function Lab Results  Component Value Date/Time   CREATININE 1.48 (H) 09/18/2019 04:26 PM   CREATININE 1.54 (H) 07/03/2019 04:22 PM   CREATININE 1.41 (H) 04/19/2015 12:01 AM   CREATININE 1.35 05/14/2014 12:01 AM   CREATININE 1.4 (H) 05/20/2013 10:58 AM   CREATININE 1.3 11/14/2012 09:19 AM   GFRNONAA 47 (L) 09/18/2019 04:26 PM   GFRAA 55 (L)  09/18/2019 04:26 PM   K 4.3 09/18/2019 04:26 PM   K 4.3 07/03/2019 04:22 PM   K 3.9 05/20/2013 10:58 AM   K 3.7 11/14/2012 09:19 AM   GFR is borderline for starting metformin.  He could possibly add glipizide XL 2.5 mg daily if needed after titration of Tresiba, however I hesitate due to episodes of hypoglycemia in the past.  Plan  NO changes based on renal function at this time.    Beverly Milch, PharmD Clinical Pharmacist [place] [phone]

## 2019-11-03 NOTE — Patient Instructions (Addendum)
Visit Information  Goals Addressed            This Visit's Progress   . Pharmacy Care Plan       CARE PLAN ENTRY (see longitudinal plan of care for additional care plan information)  Current Barriers:  . Chronic Disease Management support, education, and care coordination needs related to Hypertension, Hyperlipidemia, and Diabetes   Hypertension BP Readings from Last 3 Encounters:  06/26/19 134/82  06/11/19 140/90  05/23/19 (!) 144/82   . Pharmacist Clinical Goal(s): o Over the next 180 days, patient will work with PharmD and providers to maintain BP goal <130/80 . Current regimen:  o Enalapril 10mg  daily o Metoprolol succinate 25mg  2 tablets daily . Interventions: o Provided dietary and exercise recommendations . Patient self care activities - Over the next 180 days, patient will: o Check BP daily, document, and provide at future appointments o Ensure daily salt intake < 2300 mg/day o Exercise for 30 minutes daily 5 times per week  Hyperlipidemia Lab Results  Component Value Date/Time   LDLCALC 57 04/17/2019 03:17 PM   . Pharmacist Clinical Goal(s): o Over the next 90 days, patient will work with PharmD and providers to maintain LDL goal < 70 . Current regimen:  o Simvastatin 40mg  daily . Interventions: o Provided dietary and exercise recommendations - Increase healthy fat intake, decrease fatty foods o Recommend recheck thyroid hormones at next PCP appointment . Patient self care activities - Over the next 90 days, patient will: o Increase intake of healthy fats (Avocados, walnuts, flaxseed, etc) o Limit intake of fatty foods, fired, foods, and junk foods o Exercise at least 30 minutes 5 times a week  Diabetes Lab Results  Component Value Date/Time   HGBA1C 8.6 (H) 07/03/2019 04:22 PM   HGBA1C 9.4 (H) 03/27/2019 05:25 PM   . Pharmacist Clinical Goal(s): o Over the next 120 days, patient will work with PharmD and providers to achieve A1c goal <7% . Current  regimen:  o Tresiba 40 units daily . Interventions: o Provided dietary and exercise recommendations (mailed handouts to patient) o Discuss titration of Tresiba to 45 units daily and reassess in two weeks. o Assisted with obtaining refill for Tresiba from [employer] patient assistance program o Reviewed patient's most recent blood glucose readings . Patient self care activities - Over the next 60 days, patient will: o Check blood sugar once daily, document, and provide at future appointments o Contact provider with any episodes of hypoglycemia o Increase intake of lean protein o Try PLATE method for meal planning  Medication management . Pharmacist Clinical Goal(s): o Over the next 90 days, patient will work with PharmD and providers to maintain optimal medication adherence . Current pharmacy: [facility] . Interventions o Comprehensive medication review performed. o Continue current medication management strategy . Patient self care activities - Over the next 90 days, patient will: o Focus on medication adherence by consider use of a pill box to organize medications o Take medications as prescribed o Report any questions or concerns to PharmD and/or provider(s)  Please see past updates related to this goal by clicking on the "Past Updates" button in the selected goal         The patient verbalized understanding of instructions provided today and agreed to receive a mailed copy of patient instruction and/or educational materials.  Telephone follow up appointment with pharmacy team member scheduled for: 2 weeks  Beverly Milch, PharmD Clinical Pharmacist [facility] [phone]  Carbohydrate Counting for Diabetes Mellitus, Adult  Carbohydrate counting is a method of keeping track of how many carbohydrates you eat. Eating carbohydrates naturally increases the amount of sugar (glucose) in the blood. Counting how many carbohydrates you eat helps keep  your blood glucose within normal limits, which helps you manage your diabetes (diabetes mellitus). It is important to know how many carbohydrates you can safely have in each meal. This is different for every person. A diet and nutrition specialist (registered dietitian) can help you make a meal plan and calculate how many carbohydrates you should have at each meal and snack. Carbohydrates are found in the following foods:  Grains, such as breads and cereals.  Dried beans and soy products.  Starchy vegetables, such as potatoes, peas, and corn.  Fruit and fruit juices.  Milk and yogurt.  Sweets and snack foods, such as cake, cookies, candy, chips, and soft drinks. How do I count carbohydrates? There are two ways to count carbohydrates in food. You can use either of the methods or a combination of both. Reading "Nutrition Facts" on packaged food The "Nutrition Facts" list is included on the labels of almost all packaged foods and beverages in the [place] It includes:  The serving size.  Information about nutrients in each serving, including the grams (g) of carbohydrate per serving. To use the "Nutrition Facts":  Decide how many servings you will have.  Multiply the number of servings by the number of carbohydrates per serving.  The resulting number is the total amount of carbohydrates that you will be having. Learning standard serving sizes of other foods When you eat carbohydrate foods that are not packaged or do not include "Nutrition Facts" on the label, you need to measure the servings in order to count the amount of carbohydrates:  Measure the foods that you will eat with a food scale or measuring cup, if needed.  Decide how many standard-size servings you will eat.  Multiply the number of servings by 15. Most carbohydrate-rich foods have about 15 g of carbohydrates per serving. ? For example, if you eat 8 oz (170 g) of strawberries, you will have eaten 2 servings and 30 g of  carbohydrates (2 servings x 15 g = 30 g).  For foods that have more than one food mixed, such as soups and casseroles, you must count the carbohydrates in each food that is included. The following list contains standard serving sizes of common carbohydrate-rich foods. Each of these servings has about 15 g of carbohydrates:   hamburger bun or  English muffin.   oz (15 mL) syrup.   oz (14 g) jelly.  1 slice of bread.  1 six-inch tortilla.  3 oz (85 g) cooked rice or pasta.  4 oz (113 g) cooked dried beans.  4 oz (113 g) starchy vegetable, such as peas, corn, or potatoes.  4 oz (113 g) hot cereal.  4 oz (113 g) mashed potatoes or  of a large baked potato.  4 oz (113 g) canned or frozen fruit.  4 oz (120 mL) fruit juice.  4-6 crackers.  6 chicken nuggets.  6 oz (170 g) unsweetened dry cereal.  6 oz (170 g) plain fat-free yogurt or yogurt sweetened with artificial sweeteners.  8 oz (240 mL) milk.  8 oz (170 g) fresh fruit or one small piece of fruit.  24 oz (680 g) popped popcorn. Example of carbohydrate counting Sample meal  3 oz (85 g) chicken breast.  6 oz (170  g) brown rice.  4 oz (113 g) corn.  8 oz (240 mL) milk.  8 oz (170 g) strawberries with sugar-free whipped topping. Carbohydrate calculation 1. Identify the foods that contain carbohydrates: ? Rice. ? Corn. ? Milk. ? Strawberries. 2. Calculate how many servings you have of each food: ? 2 servings rice. ? 1 serving corn. ? 1 serving milk. ? 1 serving strawberries. 3. Multiply each number of servings by 15 g: ? 2 servings rice x 15 g = 30 g. ? 1 serving corn x 15 g = 15 g. ? 1 serving milk x 15 g = 15 g. ? 1 serving strawberries x 15 g = 15 g. 4. Add together all of the amounts to find the total grams of carbohydrates eaten: ? 30 g + 15 g + 15 g + 15 g = 75 g of carbohydrates total. Summary  Carbohydrate counting is a method of keeping track of how many carbohydrates you eat.  Eating  carbohydrates naturally increases the amount of sugar (glucose) in the blood.  Counting how many carbohydrates you eat helps keep your blood glucose within normal limits, which helps you manage your diabetes.  A diet and nutrition specialist (registered dietitian) can help you make a meal plan and calculate how many carbohydrates you should have at each meal and snack. This information is not intended to replace advice given to you by your health care provider. Make sure you discuss any questions you have with your health care provider. Document Revised: 08/03/2016 Document Reviewed: 06/23/2015 Elsevier Patient Education  [place].

## 2019-11-05 ENCOUNTER — Telehealth: Payer: Medicare HMO

## 2019-11-10 DIAGNOSIS — Z125 Encounter for screening for malignant neoplasm of prostate: Secondary | ICD-10-CM | POA: Diagnosis not present

## 2019-11-10 DIAGNOSIS — R3912 Poor urinary stream: Secondary | ICD-10-CM | POA: Diagnosis not present

## 2019-11-10 DIAGNOSIS — N401 Enlarged prostate with lower urinary tract symptoms: Secondary | ICD-10-CM | POA: Diagnosis not present

## 2019-11-10 DIAGNOSIS — R3914 Feeling of incomplete bladder emptying: Secondary | ICD-10-CM | POA: Diagnosis not present

## 2019-11-10 DIAGNOSIS — R351 Nocturia: Secondary | ICD-10-CM | POA: Diagnosis not present

## 2019-11-19 ENCOUNTER — Ambulatory Visit: Payer: Medicare HMO | Admitting: Podiatry

## 2019-11-19 ENCOUNTER — Ambulatory Visit (INDEPENDENT_AMBULATORY_CARE_PROVIDER_SITE_OTHER): Payer: Medicare HMO

## 2019-11-19 ENCOUNTER — Other Ambulatory Visit: Payer: Self-pay

## 2019-11-19 DIAGNOSIS — T85848A Pain due to other internal prosthetic devices, implants and grafts, initial encounter: Secondary | ICD-10-CM | POA: Diagnosis not present

## 2019-11-19 DIAGNOSIS — Z978 Presence of other specified devices: Secondary | ICD-10-CM

## 2019-11-19 DIAGNOSIS — M79671 Pain in right foot: Secondary | ICD-10-CM

## 2019-11-20 ENCOUNTER — Encounter: Payer: Self-pay | Admitting: Podiatry

## 2019-11-20 NOTE — Progress Notes (Signed)
Subjective:  Patient ID: Gregory Friedman, male    DOB: [birth year],  MRN: [id number]  Chief Complaint  Patient presents with  . Foot Pain    Right foot pain. PT stated he had an injury in 2005 and surgery done in 2012 but is still having pain in the foot .    69 y.o. male presents with the above complaint.  Patient presents with right foot pain in the posterior plantar heel.  Patient states he had an injury in 2005 had surgery done with subtalar joint fusion 2012.  Patient states he started having pain right in the foot.  He states it came out of nowhere.  The foot will also gone numb and swell up.  Patient is also diabetic with last A1c of 9.3.  Patient states that his pain is right now bearable and is not very excruciating however he started to notice the pain.  He wants to make sure that there is nothing wrong with it.  He denies any other acute complaints.  He has not seen anyone else prior to seeing me for this.   Review of Systems: Negative except as noted in the HPI. Denies N/V/F/Ch.  Past Medical History:  Diagnosis Date  . Anxiety   . BPH (benign prostatic hyperplasia)   . Cancer ([place]) 2014   neck area  . Colon polyps   . Coronary artery disease   . Depression   . Diverticulosis   . GERD (gastroesophageal reflux disease)   . History of gout    toe  . 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 Hyperlipidemia   . Hypertension    states under control with med., has been on med. since 2003  . 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 Myocardial infarction ([place]) 2003  . Non-insulin dependent type 2 diabetes mellitus ([place])   . PONV (postoperative nausea and vomiting)   . S/P radiation therapy    Received 8 fractions, then stopped due to mucositis with Radiation/ Cisplatin  . Status post dilation of esophageal narrowing   . Trigger thumb of right hand 01/2016    Current Outpatient  Medications:  .  Accu-Chek Softclix Lancets lancets, USE AS DIRECTED, Disp: 300 each, Rfl: 1 .  aspirin EC 81 MG tablet, Take 81 mg by mouth at bedtime. , Disp: , Rfl:  .  Coenzyme Q10 200 MG capsule, Take 200 mg by mouth at bedtime. , Disp: , Rfl:  .  enalapril (VASOTEC) 10 MG tablet, TAKE 1 TABLET EVERY DAY, Disp: 90 tablet, Rfl: 1 .  glucose blood (ACCU-CHEK GUIDE) test strip, 1 each by Other route 2 (two) times daily. Use as instructed, Disp: 200 each, Rfl: 12 .  insulin degludec (TRESIBA FLEXTOUCH) 100 UNIT/ML FlexTouch Pen, Inject 0.4 mLs (40 Units total) into the skin daily. 55 unites per day (Patient taking differently: Inject 40 Units into the skin daily. ), Disp: 30 mL, Rfl: 3 .  Insulin Pen Needle 32G X 4 MM MISC, 1 Device by Does not apply route in the morning, at noon, in the evening, and at bedtime., Disp: 400 each, Rfl: 3 .  Krill Oil (OMEGA-3) 500 MG CAPS, Take 1 capsule by mouth daily., Disp: , Rfl:  .  meclizine (ANTIVERT) 25 MG tablet, TAKE 1 TABLET THREE TIMES DAILY FOR DIZZINESS, Disp: 270 tablet, Rfl: 0 .  melatonin 1 MG TABS tablet, Take 1  mg by mouth at bedtime., Disp: , Rfl:  .  metoprolol succinate (TOPROL-XL) 50 MG 24 hr tablet, TAKE 1 TABLET EVERY DAY (NEW DIRECTIONS), Disp: 90 tablet, Rfl: 1 .  Misc Natural Products (TART CHERRY ADVANCED PO), Take by mouth., Disp: , Rfl:  .  nitroGLYCERIN (NITROSTAT) 0.4 MG SL tablet, Place 1 tablet (0.4 mg total) under the tongue every 5 (five) minutes as needed. Chest pain, Disp: 25 tablet, Rfl: 3 .  omeprazole (PRILOSEC) 40 MG capsule, TAKE 1 CAPSULE EVERY DAY BEFORE A MEAL (Patient taking differently: Take 40 mg by mouth at bedtime. ), Disp: 90 capsule, Rfl: 0 .  Saw Palmetto, Serenoa repens, 320 MG CAPS, Take 320 mg by mouth at bedtime. , Disp: , Rfl:  .  simvastatin (ZOCOR) 40 MG tablet, TAKE 1 TABLET  EVERY EVENING., Disp: 90 tablet, Rfl: 1 .  triamcinolone cream (KENALOG) 0.1 %, Apply 1 application topically 3 (three) times  daily. Apply to rash bid x 7-10 days, then prn when rash return, Disp: 80 g, Rfl: 1  Social History   Tobacco Use  Smoking Status Never Smoker  Smokeless Tobacco Never Used    No Known Allergies Objective:  There were no vitals filed for this visit. There is no height or weight on file to calculate BMI. Constitutional Well developed. Well nourished.  Vascular Dorsalis pedis pulses palpable bilaterally. Posterior tibial pulses palpable bilaterally. Capillary refill normal to all digits.  No cyanosis or clubbing noted. Pedal hair growth normal.  Neurologic Normal speech. Oriented to person, place, and time. Epicritic sensation to light touch grossly present bilaterally.  Dermatologic Nails well groomed and normal in appearance. No open wounds. No skin lesions.  Orthopedic:  Pain on palpation to the posterior plantar heel at a previous incision site for the insertion of the subtalar joint screw.  No ulceration noted.  Pain is very localized to the insertion of the screw.  This is likely attributed to the prominent screw head.   Radiographs: 3 views of skeletally mature adult right foot: Hardware is intact.  Subtalar joint appears to be completely consolidated and fused.  All the other joints are within normal limits.  No other bony abnormalities identified. Assessment:   1. Orthopedic hardware present   2. Pain from implanted hardware, initial encounter    Plan:  Patient was evaluated and treated and all questions answered.  Right painful hardware (subtalar joint screw) -I explained to the patient the etiology of pain and various treatment options were extensively discussed.  Given that his pain is very localized to the insertion of the screw with the prominent screw head I believe patient will benefit from removal of the screw itself.  On radiographs it appears that the subtalar joint is completely fused at this time he does not need to screw at all.  Given that he is having a lot  of pain he will benefit from hardware removal.  However his A1c is 9.3 and given that his pain is only mild without any other sites wounds or any other sites of infection present I will hold off on until he gets his A1c under control.  I will see him back again in 6 months and as long as his A1c is below 8% I plan on taking the hardware out as his risk of wound complication goes down significantly.  Patient states understanding he will return to me in 6 months after he is managed diet control and getting his sugar under control. No follow-ups  on file.

## 2019-11-25 ENCOUNTER — Telehealth: Payer: Self-pay | Admitting: Pharmacist

## 2019-11-25 NOTE — Chronic Care Management (AMB) (Signed)
Chronic Care Management Pharmacy Assistant   Name: Gregory Friedman  MRN: [id number] DOB: 1950/05/06  Reason for Encounter: Medication Review - Patient Assistance Coordination   PCP : [name], MD  Allergies:  No Known Allergies  Medications: Outpatient Encounter Medications as of 11/25/2019  Medication Sig   Accu-Chek Softclix Lancets lancets USE AS DIRECTED   aspirin EC 81 MG tablet Take 81 mg by mouth at bedtime.    Coenzyme Q10 200 MG capsule Take 200 mg by mouth at bedtime.    enalapril (VASOTEC) 10 MG tablet TAKE 1 TABLET EVERY DAY   glucose blood (ACCU-CHEK GUIDE) test strip 1 each by Other route 2 (two) times daily. Use as instructed   insulin degludec (TRESIBA FLEXTOUCH) 100 UNIT/ML FlexTouch Pen Inject 0.4 mLs (40 Units total) into the skin daily. 55 unites per day (Patient taking differently: Inject 40 Units into the skin daily. )   Insulin Pen Needle 32G X 4 MM MISC 1 Device by Does not apply route in the morning, at noon, in the evening, and at bedtime.   Krill Oil (OMEGA-3) 500 MG CAPS Take 1 capsule by mouth daily.   meclizine (ANTIVERT) 25 MG tablet TAKE 1 TABLET THREE TIMES DAILY FOR DIZZINESS   melatonin 1 MG TABS tablet Take 1 mg by mouth at bedtime.   metoprolol succinate (TOPROL-XL) 50 MG 24 hr tablet TAKE 1 TABLET EVERY DAY (NEW DIRECTIONS)   Misc Natural Products (TART CHERRY ADVANCED PO) Take by mouth.   nitroGLYCERIN (NITROSTAT) 0.4 MG SL tablet Place 1 tablet (0.4 mg total) under the tongue every 5 (five) minutes as needed. Chest pain   omeprazole (PRILOSEC) 40 MG capsule TAKE 1 CAPSULE EVERY DAY BEFORE A MEAL (Patient taking differently: Take 40 mg by mouth at bedtime. )   Saw Palmetto, Serenoa repens, 320 MG CAPS Take 320 mg by mouth at bedtime.    simvastatin (ZOCOR) 40 MG tablet TAKE 1 TABLET  EVERY EVENING.   triamcinolone cream (KENALOG) 0.1 % Apply 1 application topically 3 (three) times daily. Apply to rash bid x 7-10 days,  then prn when rash return   No facility-administered encounter medications on file as of 11/25/2019.    Current Diagnosis: Patient Active Problem List   Diagnosis Date Noted   Low back pain 06/11/2019   Type 2 diabetes mellitus with hyperglycemia, with long-term current use of insulin ([place]) 05/23/2019   Type 2 diabetes mellitus with stage 3a chronic kidney disease, with long-term current use of insulin ([place]) 05/23/2019   Dyslipidemia 05/23/2019   Psoriasis 12/12/2018   Skin lesion 10/31/2018   Diabetes mellitus type 2, insulin dependent ([place]) 09/12/2018   Elevated serum creatinine 09/12/2018   Hypothyroidism 10/10/2017   Atherosclerosis of aorta ([place]) 10/10/2017   Unspecified skin changes 10/10/2017   Other long term (current) drug therapy 10/10/2017   Abdominal pain 09/21/2017   Nausea & vomiting 09/21/2017   History of head and neck cancer 09/21/2017   History of colonic polyps 04/04/2016   Dysphagia 04/04/2016   Bloating 04/04/2016   Diarrhea 04/04/2016   Trigger finger of right thumb 08/23/2015   Chest pain 07/15/2015   Pain in the chest    S/P CABG (coronary artery bypass graft)    Noncompliance with medications 07/08/2015   Acute gout 04/19/2015   GERD (gastroesophageal reflux disease) 05/14/2014   Depression, major, in remission ([place]) 03/27/2013   Allergic rhinitis, cause unspecified 03/27/2013   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 Awaiting for provider signature, and for patients proof of income and signature to fax.  Christian Davis,CPP. Notified  Judithann Sheen, [place] Pharmacist Assistant [phone]

## 2019-12-04 ENCOUNTER — Ambulatory Visit: Payer: Medicare HMO | Admitting: Internal Medicine

## 2019-12-09 DIAGNOSIS — H40013 Open angle with borderline findings, low risk, bilateral: Secondary | ICD-10-CM | POA: Diagnosis not present

## 2019-12-16 ENCOUNTER — Telehealth: Payer: Self-pay

## 2019-12-16 DIAGNOSIS — N401 Enlarged prostate with lower urinary tract symptoms: Secondary | ICD-10-CM | POA: Diagnosis not present

## 2019-12-16 DIAGNOSIS — R3914 Feeling of incomplete bladder emptying: Secondary | ICD-10-CM | POA: Diagnosis not present

## 2019-12-16 DIAGNOSIS — Z125 Encounter for screening for malignant neoplasm of prostate: Secondary | ICD-10-CM | POA: Diagnosis not present

## 2019-12-16 DIAGNOSIS — R3912 Poor urinary stream: Secondary | ICD-10-CM | POA: Diagnosis not present

## 2019-12-16 NOTE — Chronic Care Management (AMB) (Signed)
Chronic Care Management Pharmacy Assistant   Name: Gregory Friedman  MRN: [id number] DOB: [birth year]  Reason for Encounter: Disease State - Diabetes and Hypertension Adherence Call.  Patient Questions:   1.  Have you seen any other providers since your last visit? Yes  11/19/2019 Boneta Lucks, DPM (Podiatrist)- right foot pain follow up in 6 months   2.  Any changes in your medicines or health? No, patient has not had any changes    PCP : [name], MD  Allergies:  No Known Allergies  Medications: Outpatient Encounter Medications as of 12/16/2019  Medication Sig  . Accu-Chek Softclix Lancets lancets USE AS DIRECTED  . aspirin EC 81 MG tablet Take 81 mg by mouth at bedtime.   . Coenzyme Q10 200 MG capsule Take 200 mg by mouth at bedtime.   . enalapril (VASOTEC) 10 MG tablet TAKE 1 TABLET EVERY DAY  . glucose blood (ACCU-CHEK GUIDE) test strip 1 each by Other route 2 (two) times daily. Use as instructed  . insulin degludec (TRESIBA FLEXTOUCH) 100 UNIT/ML FlexTouch Pen Inject 0.4 mLs (40 Units total) into the skin daily. 55 unites per day (Patient taking differently: Inject 40 Units into the skin daily. )  . Insulin Pen Needle 32G X 4 MM MISC 1 Device by Does not apply route in the morning, at noon, in the evening, and at bedtime.  [name] (OMEGA-3) 500 MG CAPS Take 1 capsule by mouth daily.  . meclizine (ANTIVERT) 25 MG tablet TAKE 1 TABLET THREE TIMES DAILY FOR DIZZINESS  . melatonin 1 MG TABS tablet Take 1 mg by mouth at bedtime.  . metoprolol succinate (TOPROL-XL) 50 MG 24 hr tablet TAKE 1 TABLET EVERY DAY (NEW DIRECTIONS)  . Misc Natural Products (TART CHERRY ADVANCED PO) Take by mouth.  . nitroGLYCERIN (NITROSTAT) 0.4 MG SL tablet Place 1 tablet (0.4 mg total) under the tongue every 5 (five) minutes as needed. Chest pain  . omeprazole (PRILOSEC) 40 MG capsule TAKE 1 CAPSULE EVERY DAY BEFORE A MEAL (Patient taking differently: Take 40 mg by mouth at bedtime. )  .  Saw Palmetto, Serenoa repens, 320 MG CAPS Take 320 mg by mouth at bedtime.   . simvastatin (ZOCOR) 40 MG tablet TAKE 1 TABLET  EVERY EVENING.  [name] triamcinolone cream (KENALOG) 0.1 % Apply 1 application topically 3 (three) times daily. Apply to rash bid x 7-10 days, then prn when rash return   No facility-administered encounter medications on file as of 12/16/2019.    Current Diagnosis: Patient Active Problem List   Diagnosis Date Noted  . Low back pain 06/11/2019  . Type 2 diabetes mellitus with hyperglycemia, with long-term current use of insulin ([place]) 05/23/2019  . Type 2 diabetes mellitus with stage 3a chronic kidney disease, with long-term current use of insulin ([place]) 05/23/2019  . Dyslipidemia 05/23/2019  . Psoriasis 12/12/2018  . Skin lesion 10/31/2018  . Diabetes mellitus type 2, insulin dependent ([place]) 09/12/2018  . Elevated serum creatinine 09/12/2018  . Hypothyroidism 10/10/2017  . Atherosclerosis of aorta ([place]) 10/10/2017  . Unspecified skin changes 10/10/2017  . Other long term (current) drug therapy 10/10/2017  . Abdominal pain 09/21/2017  . Nausea & vomiting 09/21/2017  . History of head and neck cancer 09/21/2017  . History of colonic polyps 04/04/2016  . Dysphagia 04/04/2016  . Bloating 04/04/2016  . Diarrhea 04/04/2016  . Trigger finger of right thumb 08/23/2015  . Chest pain 07/15/2015  . Pain in the chest   .  S/P CABG (coronary artery bypass graft)   . Noncompliance with medications 07/08/2015  . Acute gout 04/19/2015  . GERD (gastroesophageal reflux disease) 05/14/2014  . Depression, major, in remission ([place]) 03/27/2013  . Allergic rhinitis, cause unspecified 03/27/2013  . 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 Depressive disorder, not elsewhere classified 12/06/2011  . Nonspecific (abnormal) findings on radiological and other examination of gastrointestinal tract 02/10/2011  . Dysphagia, unspecified(787.20) 02/10/2011  . Palpitations  02/02/2011  . Dizziness 07/15/2010  . DIAB W/OPHTH MANIFESTS TYPE I [JUV TYPE] UNCNTRL 08/02/2009  . Mixed hyperlipidemia 08/02/2009  . Essential hypertension 08/02/2009  . CAD (coronary artery disease) 08/02/2009   Recent Relevant Labs: Lab Results  Component Value Date/Time   HGBA1C 9.3 (H) 09/18/2019 04:26 PM   HGBA1C 8.6 (H) 07/03/2019 04:22 PM   MICROALBUR 10 10/31/2018 10:57 AM   MICROALBUR 1.0 04/19/2015 12:01 AM   MICROALBUR 0.2 05/14/2014 12:01 AM    Kidney Function Lab Results  Component Value Date/Time   CREATININE 1.48 (H) 09/18/2019 04:26 PM   CREATININE 1.54 (H) 07/03/2019 04:22 PM   CREATININE 1.41 (H) 04/19/2015 12:01 AM   CREATININE 1.35 05/14/2014 12:01 AM   CREATININE 1.4 (H) 05/20/2013 10:58 AM   CREATININE 1.3 11/14/2012 09:19 AM   GFRNONAA 47 (L) 09/18/2019 04:26 PM   GFRAA 55 (L) 09/18/2019 04:26 PM    . Current antihyperglycemic regimen:  o Tresiba 40 units daily . What recent interventions/DTPs have been made to improve glycemic control:  o Patient states he takes all medications as directed by provider. . Have there been any recent hospitalizations or ED visits since last visit with CPP? No   . Patient denies hypoglycemic symptoms, including Pale, Sweaty, Shaky, Nervous/irritable and Vision changes. Patient did state that he has some issues when he wakes up first thing in the morning being hungry.  .  . Patient denies hyperglycemic symptoms, including blurry vision, excessive thirst, fatigue, polyuria and weakness   . How often are you checking your blood sugar? Once a day  . What are your blood sugars ranging?  o Fasting: 11/20-92 o Fasting: 11/21-84 o Fasting: 11/22-114 o Fasting: 11/23-122 o Before meals: None o After meals:None  o Bedtime: None . During the week, how often does your blood glucose drop below 70? Patient denied any readings below 70.  . Are you checking your feet daily/regularly? Yes, patient checks feet daily. Patient  also was seen by Podiatrist on 11/19/2019. Denies any open sores, numbness and tingling and pain.   Adherence Review: Is the patient currently on a STATIN medication? Yes Is the patient currently on ACE/ARB medication? Yes Does the patient have >5 day gap between last estimated fill dates? None- Patient has Patient Assistance for [place].  Reviewed chart prior to disease state call. Spoke with patient regarding BP  Recent Office Vitals: BP Readings from Last 3 Encounters:  09/18/19 128/78  09/18/19 (!) 128/98  06/26/19 134/82   Pulse Readings from Last 3 Encounters:  09/18/19 (!) 54  09/18/19 (!) 54  06/26/19 (!) 58    Wt Readings from Last 3 Encounters:  09/18/19 193 lb 12.8 oz (87.9 kg)  09/18/19 193 lb 12.6 oz (87.9 kg)  06/26/19 197 lb 6.4 oz (89.5 kg)     Kidney Function Lab Results  Component Value Date/Time   CREATININE 1.48 (H) 09/18/2019 04:26 PM   CREATININE 1.54 (H) 07/03/2019 04:22 PM   CREATININE 1.41 (H) 04/19/2015 12:01 AM   CREATININE 1.35 05/14/2014  12:01 AM   CREATININE 1.4 (H) 05/20/2013 10:58 AM   CREATININE 1.3 11/14/2012 09:19 AM   GFRNONAA 47 (L) 09/18/2019 04:26 PM   GFRAA 55 (L) 09/18/2019 04:26 PM    BMP Latest Ref Rng & Units 09/18/2019 07/03/2019 04/17/2019  Glucose 65 - 99 mg/dL 197(H) 263(H) 228(H)  BUN 8 - 27 mg/dL 16 16 16   Creatinine 0.76 - 1.27 mg/dL 1.48(H) 1.54(H) 1.43(H)  BUN/Creat Ratio 10 - 24 11 - 11  Sodium 134 - 144 mmol/L 139 138 142  Potassium 3.5 - 5.2 mmol/L 4.3 4.3 4.7  Chloride 96 - 106 mmol/L 99 102 100  CO2 20 - 29 mmol/L 27 26 27   Calcium 8.6 - 10.2 mg/dL 9.7 9.4 9.6    . Current antihypertensive regimen:  o Enalapril 10 mg daily o Metoprolol succinate 25 mg 2 tablets daily . How often are you checking your Blood Pressure? Patient checks blood pressure everyday  . Current home BP readings:  . 134/63 11/29 . 135/71- 11/28 . 135/71- 11/27 . What recent interventions/DTPs have been made by any provider to improve  Blood Pressure control since last CPP Visit: Patient states he is taking his medications per providers recommendations.   . Any recent hospitalizations or ED visits since last visit with CPP? No  . What diet changes have been made to improve Blood Pressure Control?  o Patient has watched his salt intake. o He is watching fat intake o Eating more baked foods  . What exercise is being done to improve your Blood Pressure Control?  o Patient rides a stationary bike at home and lifts weights. He rides about 6 miles daily.  Adherence Review: Is the patient currently on ACE/ARB medication? Yes Does the patient have >5 day gap between last estimated fill dates? Reviewed chart and adherence measure. Per insurance data, patient is at 100% for his hypertension medication adherence.      Goals Addressed            This Visit's Progress   . Pharmacy Care Plan   On track    CARE PLAN ENTRY (see longitudinal plan of care for additional care plan information)  Current Barriers:  . Chronic Disease Management support, education, and care coordination needs related to Hypertension, Hyperlipidemia, and Diabetes   Hypertension BP Readings from Last 3 Encounters:  06/26/19 134/82  06/11/19 140/90  05/23/19 (!) 144/82   . Pharmacist Clinical Goal(s): o Over the next 180 days, patient will work with PharmD and providers to maintain BP goal <130/80 . Current regimen:  o Enalapril 10mg  daily o Metoprolol succinate 25mg  2 tablets daily . Interventions: o Provided dietary and exercise recommendations . Patient self care activities - Over the next 180 days, patient will: o Check BP daily, document, and provide at future appointments o Ensure daily salt intake < 2300 mg/day o Exercise for 30 minutes daily 5 times per week  Hyperlipidemia Lab Results  Component Value Date/Time   LDLCALC 57 04/17/2019 03:17 PM   . Pharmacist Clinical Goal(s): o Over the next 90 days, patient will work with  PharmD and providers to maintain LDL goal < 70 . Current regimen:  o Simvastatin 40mg  daily . Interventions: o Provided dietary and exercise recommendations - Increase healthy fat intake, decrease fatty foods o Recommend recheck thyroid hormones at next PCP appointment . Patient self care activities - Over the next 90 days, patient will: o Increase intake of healthy fats (Avocados, walnuts, flaxseed, etc) o Limit intake of  fatty foods, fired, foods, and junk foods o Exercise at least 30 minutes 5 times a week  Diabetes Lab Results  Component Value Date/Time   HGBA1C 8.6 (H) 07/03/2019 04:22 PM   HGBA1C 9.4 (H) 03/27/2019 05:25 PM   . Pharmacist Clinical Goal(s): o Over the next 120 days, patient will work with PharmD and providers to achieve A1c goal <7% . Current regimen:  o Tresiba 40 units daily . Interventions: o Provided dietary and exercise recommendations (mailed handouts to patient) o Discuss titration of Tresiba to 45 units daily and reassess in two weeks. o Assisted with obtaining refill for Tresiba from [employer] patient assistance program o Reviewed patient's most recent blood glucose readings . Patient self care activities - Over the next 60 days, patient will: o Check blood sugar once daily, document, and provide at future appointments o Contact provider with any episodes of hypoglycemia o Increase intake of lean protein o Try PLATE method for meal planning  Medication management . Pharmacist Clinical Goal(s): o Over the next 90 days, patient will work with PharmD and providers to maintain optimal medication adherence . Current pharmacy: [facility] . Interventions o Comprehensive medication review performed. o Continue current medication management strategy . Patient self care activities - Over the next 90 days, patient will: o Focus on medication adherence by consider use of a pill box to organize medications o Take medications as  prescribed o Report any questions or concerns to PharmD and/or provider(s)  Please see past updates related to this goal by clicking on the "Past Updates" button in the selected goal         Follow-Up:  Pharmacist Review - Reviewed chart and adherence measures. Per insurance data patient is at 90%-99% with cholesterol adherence.  [name], CPP Notified   Judithann Sheen, [place] Pharmacist Assistant [phone]

## 2019-12-24 ENCOUNTER — Telehealth: Payer: Self-pay | Admitting: Internal Medicine

## 2019-12-24 ENCOUNTER — Other Ambulatory Visit: Payer: Self-pay | Admitting: Urology

## 2019-12-24 NOTE — Telephone Encounter (Signed)
   [facility] Pre-operative Risk Assessment    Request for surgical clearance:  1. What type of surgery is being performed? Transurethral Resection of the Prostate   2. When is this surgery scheduled? 01/30/20   3. What type of clearance is required (medical clearance vs. Pharmacy clearance to hold med vs. Both)? Both  4. Are there any medications that need to be held prior to surgery and how long? Aspirin 81 mg - 5 days prior  5. Practice name and name of physician performing surgery? [facility]  Dr. Rexene Alberts   6. What is your office phone number? [phone] (ext#: [phone])    7.   What is your office fax number? [phone]  8.   Anesthesia type (None, local, MAC, general) ? General    Gregory Friedman 12/24/2019, 4:04 PM  _________________________________________________________________

## 2019-12-25 ENCOUNTER — Telehealth: Payer: Self-pay

## 2019-12-25 NOTE — Telephone Encounter (Signed)
Attempted to call patient to schedule earlier appointment for pre op clearance. Unable to leave message . Voicemail not set up.

## 2019-12-25 NOTE — Telephone Encounter (Signed)
   Primary Cardiologist: Dorris Carnes, MD  Chart reviewed as part of pre-operative protocol coverage. Because of Gregory Friedman's past medical history and time since last visit, he will require a follow-up visit in order to better assess preoperative cardiovascular risk.  Pre-op covering staff: - Please schedule appointment and call patient to inform them. If patient already had an upcoming appointment within acceptable timeframe, please add "pre-op clearance" to the appointment notes so provider is aware. - Please contact requesting surgeon's office via preferred method (i.e, phone, fax) to inform them of need for appointment prior to surgery.  If applicable, this message will also be routed to pharmacy pool and/or primary cardiologist for input on holding anticoagulant/antiplatelet agent as requested below so that this information is available to the clearing provider at time of patient's appointment.    He has an appt on 1/21 - keep that one and see if se can get him in with an APP sooner for clearance. Can check the [facility] office.  [place], PA  12/25/2019, 10:07 AM

## 2019-12-29 NOTE — Telephone Encounter (Signed)
Message send to scheduling for sooner appt

## 2019-12-29 NOTE — Telephone Encounter (Signed)
Pre-op covering staff, can you please just make sure requesting surgeon's office is aware of need for appointment?  Thank you!

## 2019-12-29 NOTE — Progress Notes (Addendum)
CARDIOLOGY OFFICE NOTE  Date:  12/31/2019    Gregory Friedman Date of Birth: May 29, 1950 Medical Record #[id number]  PCP:  [name], MD  Cardiologist:  Harrington Challenger   Chief Complaint  Patient presents with  . Pre-op Exam    Seen for Dr. Harrington Challenger    History of Present Illness: Gregory Friedman is a 69 y.o. male who presents today for a pre op clearance visit. Seen for Dr. Harrington Challenger.   He has a history of known CAD with prior PCI and CABG (2005 in [place]), HTN,  type 2 diabetes, HLD, head and neck CA (s/p XRT/surg/chemo) and anxiety. Cath in 2012 which showed LIMA to LAD atretic, SVG to OM occluded. There were multiple areas which could be causing chest pain or ischemia (severe disease in distal LCx or mod disease in diag).    He had Myoview done in 2017 - no ischemia. EF by echo with 45 to 50 %.   Last seen in March by Dr. Harrington Challenger - ok from our standpoint.   Comes in today. Here alone. Needing TURP - planned for early January. He feels good. No chest pain. Not short of breath. Not dizzy or lightheaded. No syncope. Riding his bike 6 miles a day. Doing all yard work with no issue. Can go up stairs and walk without issue. BP readings from his cuff are excellent. Has chronic bradycardia - not symptomatic. Tolerating medicines. Seeing PCP tomorrow. He has been drinking celery water which he feels has made him feel better overall.    Past Medical History:  Diagnosis Date  . Anxiety   . BPH (benign prostatic hyperplasia)   . Cancer ([place]) 2014   neck area  . Colon polyps   . Coronary artery disease   . Depression   . Diverticulosis   . GERD (gastroesophageal reflux disease)   . History of gout    toe  . 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 Hyperlipidemia   . Hypertension    states under control with med., has been on med. since 2003  . 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 Myocardial  infarction ([place]) 2003  . Non-insulin dependent type 2 diabetes mellitus ([place])   . PONV (postoperative nausea and vomiting)   . S/P radiation therapy    Received 8 fractions, then stopped due to mucositis with Radiation/ Cisplatin  . Status post dilation of esophageal narrowing   . Trigger thumb of right hand 01/2016    Past Surgical History:  Procedure Laterality Date  . CARDIAC CATHETERIZATION  05/25/2010  . CARDIOVASCULAR STRESS TEST  07/2009  . COLONOSCOPY WITH PROPOFOL  02/16/2011  . CORONARY ANGIOPLASTY  2003; 2006   2 stents in heart  . CORONARY ARTERY BYPASS GRAFT  2005  . CYSTOSCOPY  02/09/2011   Procedure: CYSTOSCOPY;  Surgeon: Marissa Nestle, MD;  Location: [facility];  Service: Urology;  Laterality: N/A;  . ESOPHAGOGASTRODUODENOSCOPY (EGD) WITH ESOPHAGEAL DILATION  02/16/2011   with Propofol  . FOOT ARTHRODESIS, SUBTALAR Right 10/13/2009  . MASS EXCISION N/A 03/13/2012   Procedure: EXCISION LEFT BUCCAL MUCOSA;  Surgeon: Rozetta Nunnery, MD;  Location: [facility];  Service: ENT;  Laterality: N/A;  . RADICAL NECK DISSECTION Left 03/13/2012   Procedure: RADICAL NECK DISSECTION;  Surgeon: Rozetta Nunnery, MD;  Location: [facility];  Service: ENT;  Laterality: Left;  . TOOTH  EXTRACTION N/A 03/13/2012   Procedure: EXTRACTION MOLARS;  Surgeon: Rozetta Nunnery, MD;  Location: [facility];  Service: ENT;  Laterality: N/A;  . TRIGGER FINGER RELEASE Right 02/24/2016   Procedure: RELEASE TRIGGER FINGER/A-1 PULLEY RIGHT THUMB;  Surgeon: Daryll Brod, MD;  Location: [facility];  Service: Orthopedics;  Laterality: Right;  FAB     Medications: Current Meds  Medication Sig  . Accu-Chek Softclix Lancets lancets USE AS DIRECTED  . aspirin EC 81 MG tablet Take 81 mg by mouth at bedtime.   . Coenzyme Q10 200 MG capsule Take 200 mg by mouth at bedtime.   . enalapril (VASOTEC) 10 MG tablet TAKE 1 TABLET EVERY DAY  . glucose blood (ACCU-CHEK GUIDE) test strip 1 each by Other route 2  (two) times daily. Use as instructed  . insulin degludec (TRESIBA FLEXTOUCH) 100 UNIT/ML FlexTouch Pen Inject 0.4 mLs (40 Units total) into the skin daily. 55 unites per day (Patient taking differently: Inject 40 Units into the skin daily. )  . Insulin Pen Needle 32G X 4 MM MISC 1 Device by Does not apply route in the morning, at noon, in the evening, and at bedtime.  [name] (OMEGA-3) 500 MG CAPS Take 1 capsule by mouth daily.  . meclizine (ANTIVERT) 25 MG tablet TAKE 1 TABLET THREE TIMES DAILY FOR DIZZINESS  . melatonin 1 MG TABS tablet Take 1 mg by mouth at bedtime.  . metoprolol succinate (TOPROL-XL) 50 MG 24 hr tablet TAKE 1 TABLET EVERY DAY (NEW DIRECTIONS)  . Misc Natural Products (TART CHERRY ADVANCED PO) Take by mouth.  . nitroGLYCERIN (NITROSTAT) 0.4 MG SL tablet Place 1 tablet (0.4 mg total) under the tongue every 5 (five) minutes as needed. Chest pain  . omeprazole (PRILOSEC) 40 MG capsule TAKE 1 CAPSULE EVERY DAY BEFORE A MEAL  . simvastatin (ZOCOR) 40 MG tablet TAKE 1 TABLET  EVERY EVENING.  . tamsulosin (FLOMAX) 0.4 MG CAPS capsule daily in the afternoon.  . triamcinolone cream (KENALOG) 0.1 % Apply 1 application topically 3 (three) times daily. Apply to rash bid x 7-10 days, then prn when rash return     Allergies: No Known Allergies  Social History: The patient  reports that he has never smoked. He has never used smokeless tobacco. He reports that he does not drink alcohol and does not use drugs.   Family History: The patient's family history includes Asthma in his father; Diabetes in his brother, brother, and sister; Heart disease in his brother; Parkinson's disease in his brother; Seizures in his mother; Stroke in his sister; Thyroid cancer in his daughter.   Review of Systems: Please see the history of present illness.   All other systems are reviewed and negative.   Physical Exam: VS:  BP 130/72   Pulse (!) 50   Ht 5\' 6"  (1.676 m)   Wt 199 lb (90.3 kg)   SpO2  97%   BMI 32.12 kg/m  .  BMI Body mass index is 32.12 kg/m.  Wt Readings from Last 3 Encounters:  12/31/19 199 lb (90.3 kg)  09/18/19 193 lb 12.8 oz (87.9 kg)  09/18/19 193 lb 12.6 oz (87.9 kg)    General: Pleasant. Well developed, well nourished and in no acute distress.   HEENT: Normal.  Neck: Supple, no JVD, carotid bruits, or masses noted.  Cardiac: Regular rate and rhythm. No murmurs, rubs, or gallops. No edema.  Respiratory:  Lungs are clear to auscultation bilaterally with normal work  of breathing.  GI: Soft and nontender.  MS: No deformity or atrophy. Gait and ROM intact.  Skin: Warm and dry. Color is normal.  Neuro:  Strength and sensation are intact and no gross focal deficits noted.  Psych: Alert, appropriate and with normal affect.   LABORATORY DATA:  EKG:  EKG is ordered today.  Personally reviewed by me. This demonstrates sinus bradycardia with prior anterior MI - unchanged - HR is 50 today.  Lab Results  Component Value Date   WBC 5.1 07/03/2019   HGB 13.7 07/03/2019   HCT 41.5 07/03/2019   PLT 164 07/03/2019   GLUCOSE 197 (H) 09/18/2019   CHOL 152 09/18/2019   TRIG 132 09/18/2019   HDL 44 09/18/2019   LDLCALC 85 09/18/2019   ALT 16 09/18/2019   AST 20 09/18/2019   NA 139 09/18/2019   K 4.3 09/18/2019   CL 99 09/18/2019   CREATININE 1.48 (H) 09/18/2019   BUN 16 09/18/2019   CO2 27 09/18/2019   TSH 4.080 10/31/2018   PSA 0.81 07/28/2015   INR 1.00 04/10/2012   HGBA1C 9.3 (H) 09/18/2019   MICROALBUR 10 10/31/2018     BNP (last 3 results) No results for input(s): BNP in the last 8760 hours.  ProBNP (last 3 results) No results for input(s): PROBNP in the last 8760 hours.   Other Studies Reviewed Today:  Myoview 07-17-15 IMPRESSION: 1. Apical infarct without definite peri-infarct ischemia. 2. Associated apical hypokinesis. 3. Left ventricular ejection fraction 47% 4. Non invasive risk stratification*: Intermediate   Monitor Study  Highlights 2017  Sinus rhythm  No arrhythmias detected    Cardiac Catheterization5/2/12 LM dist 20 LAD prox, mid, dist 30; mid stent patent; Dx mid 60 LCx dist 100 (L-L collats); OM1 (small) 50; OM2 stent patent; OM3 70 at trifurcation of 3 sub-branches,  RCA mid 50; PLB diffuse 40 L-LAD atretic S-OM2 100 EF 55 Med Rx     ASSESSMENT AND PLAN:  1. Pre op clearance - he can do over 4 mets of activity - he has no worrisome cardiac symptoms - he is felt to be a stable candidate for his TURP next month. Ok to hold aspirin for 5 days. Will be available as needed.   2. CAD - doing very well - good exercise tolerance.   3. HTN - BP looks good. He has great control at home.   4. HLD - on statin.   6. DM - he has been working harder on this. Seeing PCP tomorrow.   7. Resting bradycardia - looks chronic - he is totally asymptomatic.   Current medicines are reviewed with the patient today.  The patient does not have concerns regarding medicines other than what has been noted above.  The following changes have been made:  See above.  Labs/ tests ordered today include:    Orders Placed This Encounter  Procedures  . EKG 12-Lead     Disposition:   FU with Dr. Harrington Challenger in 6 months.    Patient is agreeable to this plan and will call if any problems develop in the interim.   SignedTruitt Merle, NP  12/31/2019 2:56 PM  [facility] [address] [place], [place]  [address] Phone: [phone] Fax: [phone]

## 2019-12-29 NOTE — Telephone Encounter (Signed)
Appt schedule on 12/08 with Isac Caddy

## 2019-12-31 ENCOUNTER — Encounter: Payer: Self-pay | Admitting: Nurse Practitioner

## 2019-12-31 ENCOUNTER — Other Ambulatory Visit: Payer: Self-pay

## 2019-12-31 ENCOUNTER — Ambulatory Visit: Payer: Medicare HMO | Admitting: Nurse Practitioner

## 2019-12-31 VITALS — BP 130/72 | HR 50 | Ht 66.0 in | Wt 199.0 lb

## 2019-12-31 DIAGNOSIS — I10 Essential (primary) hypertension: Secondary | ICD-10-CM

## 2019-12-31 DIAGNOSIS — Z01810 Encounter for preprocedural cardiovascular examination: Secondary | ICD-10-CM

## 2019-12-31 DIAGNOSIS — I2510 Atherosclerotic heart disease of native coronary artery without angina pectoris: Secondary | ICD-10-CM

## 2019-12-31 DIAGNOSIS — E782 Mixed hyperlipidemia: Secondary | ICD-10-CM | POA: Diagnosis not present

## 2019-12-31 NOTE — Patient Instructions (Addendum)
After Visit Summary:  We will be checking the following labs today - NONE   Medication Instructions:    Continue with your current medicines.    If you need a refill on your cardiac medications before your next appointment, please call your pharmacy.     Testing/Procedures To Be Arranged:  N/A  Follow-Up:   See Dr. Harrington Challenger in 6 months    At [facility], you and your health needs are our priority.  As part of our continuing mission to provide you with exceptional heart care, we have created designated Provider Care Teams.  These Care Teams include your primary Cardiologist (physician) and Advanced Practice Providers (APPs -  Physician Assistants and Nurse Practitioners) who all work together to provide you with the care you need, when you need it.  Special Instructions:  . Stay safe, wash your hands for at least 20 seconds and wear a mask when needed.  . It was good to talk with you today.  . I will send a note to Dr. Luvenia Heller . You can hold your aspirin for 5 days prior to your surgery.    Call the [place] office at [phone] if you have any questions, problems or concerns.

## 2020-01-01 ENCOUNTER — Encounter: Payer: Self-pay | Admitting: Nurse Practitioner

## 2020-01-01 ENCOUNTER — Ambulatory Visit (INDEPENDENT_AMBULATORY_CARE_PROVIDER_SITE_OTHER): Payer: Medicare HMO | Admitting: Nurse Practitioner

## 2020-01-01 VITALS — BP 144/80 | HR 59 | Temp 98.1°F | Ht 65.6 in | Wt 197.8 lb

## 2020-01-01 DIAGNOSIS — Z794 Long term (current) use of insulin: Secondary | ICD-10-CM | POA: Diagnosis not present

## 2020-01-01 DIAGNOSIS — I700 Atherosclerosis of aorta: Secondary | ICD-10-CM | POA: Diagnosis not present

## 2020-01-01 DIAGNOSIS — Z0000 Encounter for general adult medical examination without abnormal findings: Secondary | ICD-10-CM | POA: Diagnosis not present

## 2020-01-01 DIAGNOSIS — E1122 Type 2 diabetes mellitus with diabetic chronic kidney disease: Secondary | ICD-10-CM

## 2020-01-01 DIAGNOSIS — Z23 Encounter for immunization: Secondary | ICD-10-CM | POA: Diagnosis not present

## 2020-01-01 DIAGNOSIS — N400 Enlarged prostate without lower urinary tract symptoms: Secondary | ICD-10-CM

## 2020-01-01 DIAGNOSIS — E039 Hypothyroidism, unspecified: Secondary | ICD-10-CM

## 2020-01-01 DIAGNOSIS — N1831 Chronic kidney disease, stage 3a: Secondary | ICD-10-CM

## 2020-01-01 DIAGNOSIS — I10 Essential (primary) hypertension: Secondary | ICD-10-CM

## 2020-01-01 DIAGNOSIS — Z79899 Other long term (current) drug therapy: Secondary | ICD-10-CM

## 2020-01-01 DIAGNOSIS — I129 Hypertensive chronic kidney disease with stage 1 through stage 4 chronic kidney disease, or unspecified chronic kidney disease: Secondary | ICD-10-CM | POA: Diagnosis not present

## 2020-01-01 DIAGNOSIS — E782 Mixed hyperlipidemia: Secondary | ICD-10-CM

## 2020-01-01 DIAGNOSIS — Z2821 Immunization not carried out because of patient refusal: Secondary | ICD-10-CM

## 2020-01-01 LAB — POCT URINALYSIS DIPSTICK
Bilirubin, UA: NEGATIVE
Glucose, UA: NEGATIVE
Ketones, UA: NEGATIVE
Leukocytes, UA: NEGATIVE
Nitrite, UA: NEGATIVE
Protein, UA: NEGATIVE
Spec Grav, UA: 1.015
Urobilinogen, UA: 0.2 U/dL
pH, UA: 7

## 2020-01-01 NOTE — Patient Instructions (Addendum)

## 2020-01-01 NOTE — Progress Notes (Signed)
I,Yamilka Roman [employer] as a Education administrator for [employer], FNP.,have documented all relevant documentation on the behalf of Minette Brine, FNP,as directed by  Minette Brine, FNP while in the presence of Minette Brine, [place]. This visit occurred during the SARS-CoV-2 public health emergency.  Safety protocols were in place, including screening questions prior to the visit, additional usage of staff PPE, and extensive cleaning of exam room while observing appropriate contact time as indicated for disinfecting solutions.  Subjective:     Patient ID: Gregory Friedman , male    DOB: 1950/08/27 , 69 y.o.   MRN: [id number]   Chief Complaint  Patient presents with  . Annual Exam    HPI  Here for hm. He is scheduled for a TURP in January 7th 2022. He seen his cardiologist who cleared him for surgery yesterday.    Reports his blood pressure this morning was 129/72, averaging 120/70's.    Diabetes He presents for his follow-up diabetic visit. He has type 2 diabetes mellitus. There are no hypoglycemic associated symptoms. Pertinent negatives for hypoglycemia include no dizziness or headaches. There are no diabetic associated symptoms. Pertinent negatives for diabetes include no chest pain. There are no hypoglycemic complications. There are no diabetic complications. Risk factors for coronary artery disease include obesity, male sex and hypertension. Current diabetic treatment includes oral agent (monotherapy). He is compliant with treatment all of the time. He is following a generally healthy diet. When asked about meal planning, he reported none. He has not had a previous visit with a dietitian. He participates in exercise every other day. There is no change in his home blood glucose trend. (85-135 blood sugar averaging.  ) An ACE inhibitor/angiotensin II receptor blocker is being taken. He sees a podiatrist.Eye exam is current (May 2021).  Hypertension This is a chronic problem. The current episode started  more than 1 year ago. The problem is controlled. Pertinent negatives include no anxiety, chest pain, headaches or palpitations. Risk factors for coronary artery disease include obesity.     Past Medical History:  Diagnosis Date  . Anxiety   . BPH (benign prostatic hyperplasia)   . Cancer ([place]) 2014   neck area  . Colon polyps   . Coronary artery disease   . Depression   . Diverticulosis   . GERD (gastroesophageal reflux disease)   . History of gout    toe  . 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 Hyperlipidemia   . Hypertension    states under control with med., has been on med. since 2003  . 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 Myocardial infarction ([place]) 2003  . Non-insulin dependent type 2 diabetes mellitus ([place])   . PONV (postoperative nausea and vomiting)   . S/P radiation therapy    Received 8 fractions, then stopped due to mucositis with Radiation/ Cisplatin  . Status post dilation of esophageal narrowing   . Trigger thumb of right hand 01/2016     Family History  Problem Relation Age of Onset  . Stroke Sister   . Diabetes Brother   . Seizures Mother   . Asthma Father   . Thyroid cancer Daughter   . Diabetes Sister   . Parkinson's disease Brother   . Heart disease Brother   . Diabetes Brother   . Colon cancer Neg Hx      Current Outpatient Medications:  .  Accu-Chek Softclix Lancets lancets, USE AS DIRECTED, Disp: 300 each, Rfl: 1 .  aspirin EC 81 MG tablet, Take 81 mg by mouth at bedtime. , Disp: , Rfl:  .  Coenzyme Q10 200 MG capsule, Take 200 mg by mouth at bedtime. , Disp: , Rfl:  .  enalapril (VASOTEC) 10 MG tablet, TAKE 1 TABLET EVERY DAY, Disp: 90 tablet, Rfl: 1 .  glucose blood (ACCU-CHEK GUIDE) test strip, 1 each by Other route 2 (two) times daily. Use as instructed, Disp: 200 each, Rfl: 12 .  insulin degludec (TRESIBA FLEXTOUCH) 100 UNIT/ML FlexTouch Pen, Inject  0.4 mLs (40 Units total) into the skin daily. 55 unites per day (Patient taking differently: Inject 40 Units into the skin daily.), Disp: 30 mL, Rfl: 3 .  Insulin Pen Needle 32G X 4 MM MISC, 1 Device by Does not apply route in the morning, at noon, in the evening, and at bedtime., Disp: 400 each, Rfl: 3 .  meclizine (ANTIVERT) 25 MG tablet, TAKE 1 TABLET THREE TIMES DAILY FOR DIZZINESS, Disp: 270 tablet, Rfl: 0 .  metoprolol succinate (TOPROL-XL) 50 MG 24 hr tablet, TAKE 1 TABLET EVERY DAY (NEW DIRECTIONS), Disp: 90 tablet, Rfl: 1 .  Misc Natural Products (TART CHERRY ADVANCED PO), Take by mouth., Disp: , Rfl:  .  nitroGLYCERIN (NITROSTAT) 0.4 MG SL tablet, Place 1 tablet (0.4 mg total) under the tongue every 5 (five) minutes as needed. Chest pain, Disp: 25 tablet, Rfl: 3 .  simvastatin (ZOCOR) 40 MG tablet, TAKE 1 TABLET  EVERY EVENING., Disp: 90 tablet, Rfl: 1 .  tamsulosin (FLOMAX) 0.4 MG CAPS capsule, daily in the afternoon., Disp: , Rfl:  .  Krill Oil (OMEGA-3) 500 MG CAPS, Take 1 capsule by mouth daily. (Patient not taking: Reported on 01/01/2020), Disp: , Rfl:  .  melatonin 1 MG TABS tablet, Take 1 mg by mouth at bedtime. (Patient not taking: Reported on 01/01/2020), Disp: , Rfl:  .  omeprazole (PRILOSEC) 40 MG capsule, TAKE 1 CAPSULE EVERY DAY BEFORE A MEAL (Patient not taking: Reported on 01/01/2020), Disp: 90 capsule, Rfl: 0 .  triamcinolone cream (KENALOG) 0.1 %, Apply 1 application topically 3 (three) times daily. Apply to rash bid x 7-10 days, then prn when rash return (Patient not taking: Reported on 01/01/2020), Disp: 80 g, Rfl: 1   No Known Allergies   Men's preventive visit. Patient Health Questionnaire (PHQ-2) is  [place] Visit from 01/01/2020 in [facility]  PHQ-2 Total Score 0     Patient is on a regular diet; he is drinking celery juice which has helped him with bloating, sleep. He continues to exercise 30 minutes daily on his back. Marital  status: Married. Relevant history for alcohol use is:  Social History   Substance and Sexual Activity  Alcohol Use No   Relevant history for tobacco use is:  Social History   Tobacco Use  Smoking Status Never Smoker  Smokeless Tobacco Never Used  .   Review of Systems  Constitutional: Negative.   HENT: Negative.   Eyes: Negative.   Respiratory: Negative.   Cardiovascular: Negative.  Negative for chest pain, palpitations and leg swelling.  Gastrointestinal: Negative.   Endocrine: Negative.   Genitourinary: Negative.   Musculoskeletal: Negative.   Skin: Negative.   Neurological: Negative.  Negative for dizziness and headaches.  Hematological: Negative.   Psychiatric/Behavioral: Negative.      Today's Vitals   01/01/20 1415  BP: (!) 144/80  Pulse: [name])  59  Temp: 98.1 F (36.7 C)  Weight: 197 lb 12.8 oz (89.7 kg)  Height: 5' 5.6" (1.666 m)  PainSc: 0-No pain   Body mass index is 32.32 kg/m.   Objective:  Physical Exam Vitals reviewed.  Constitutional:      General: He is not in acute distress.    Appearance: Normal appearance. He is obese.  HENT:     Head: Normocephalic and atraumatic.     Right Ear: Tympanic membrane, ear canal and external ear normal. There is no impacted cerumen.     Left Ear: Tympanic membrane, ear canal and external ear normal. There is no impacted cerumen.  Cardiovascular:     Rate and Rhythm: Normal rate and regular rhythm.     Pulses: Normal pulses.     Heart sounds: Normal heart sounds. No murmur heard.   Pulmonary:     Effort: Pulmonary effort is normal. No respiratory distress.     Breath sounds: Normal breath sounds.  Abdominal:     General: Abdomen is flat. Bowel sounds are normal. There is no distension.     Palpations: Abdomen is soft.  Genitourinary:    Prostate: Normal.     Rectum: Guaiac result negative.  Musculoskeletal:        General: Normal range of motion.     Cervical back: Normal range of motion and neck  supple.  Skin:    General: Skin is warm.     Capillary Refill: Capillary refill takes less than 2 seconds.  Neurological:     General: No focal deficit present.     Mental Status: He is alert and oriented to person, place, and time.  Psychiatric:        Mood and Affect: Mood normal.        Behavior: Behavior normal.        Thought Content: Thought content normal.        Judgment: Judgment normal.         Assessment And Plan:    1. Encounter for general adult medical examination w/o abnormal findings . Behavior modifications discussed and diet history reviewed.   . Pt will continue to exercise regularly and modify diet with low GI, plant based foods and decrease intake of processed foods.  . Recommend intake of daily multivitamin, Vitamin D, and calcium.  . Recommend for preventive screenings, as well as recommend immunizations that include influenza, TDAP  2. Need for influenza vaccination  Influenza vaccine administered  Encouraged to take Tylenol as needed for fever or muscle aches. - Flu Vaccine QUAD High Dose(Fluad)  3. COVID-19 vaccination declined Declines covid 19 vaccine. Discussed risk of covid 34 and if he changes her mind about the vaccine to call the office.  Encouraged to take multivitamin, vitamin d, vitamin c and zinc to increase immune system. Aware can call office if would like to have vaccine here at office.   4. Other long term (current) drug therapy - CBC - TSH  5. Type 2 diabetes mellitus with stage 3a chronic kidney disease, with long-term current use of insulin (HCC)  Chronic, he is doing much better with her blood sugars  Continue with current medications - POCT Urinalysis Dipstick (81002) - POCT UA - Microalbumin - CMP14+EGFR - Hemoglobin A1c  6. Essential hypertension  Chronic, blood pressure is slightly elevated but I will not make any changes to his medications today  7. Mixed hyperlipidemia  Chronic, controlled  Continue with  current medications, tolerating medications well  8. Hypothyroidism, unspecified type  Chronic, has been within normal limits  9. Atherosclerosis of aorta (HCC)  Chronic, continue simvastatin daily  10. Benign prostatic hyperplasia, unspecified whether lower urinary tract symptoms present  Chronic, he is being managed by urology and is taking tamsulosin    Patient was given opportunity to ask questions. Patient verbalized understanding of the plan and was able to repeat key elements of the plan. All questions were answered to their satisfaction.    Teola Bradley, FNP, have reviewed all documentation for this visit. The documentation on 01/01/20 for the exam, diagnosis, procedures, and orders are all accurate and complete.   THE PATIENT IS ENCOURAGED TO PRACTICE SOCIAL DISTANCING DUE TO THE COVID-19 PANDEMIC.

## 2020-01-02 LAB — HEMOGLOBIN A1C
Est. average glucose Bld gHb Est-mCnc: 194 mg/dL
Hgb A1c MFr Bld: 8.4 % — ABNORMAL HIGH (ref 4.8–5.6)

## 2020-01-02 LAB — CMP14+EGFR
ALT: 14 IU/L (ref 0–44)
AST: 20 IU/L (ref 0–40)
Albumin/Globulin Ratio: 1.7 (ref 1.2–2.2)
Albumin: 4.8 g/dL (ref 3.8–4.8)
Alkaline Phosphatase: 75 IU/L (ref 44–121)
BUN/Creatinine Ratio: 7 — ABNORMAL LOW (ref 10–24)
BUN: 11 mg/dL (ref 8–27)
Bilirubin Total: 0.4 mg/dL (ref 0.0–1.2)
CO2: 26 mmol/L (ref 20–29)
Calcium: 9.8 mg/dL (ref 8.6–10.2)
Chloride: 100 mmol/L (ref 96–106)
Creatinine, Ser: 1.59 mg/dL — ABNORMAL HIGH (ref 0.76–1.27)
GFR calc Af Amer: 50 mL/min/{1.73_m2} — ABNORMAL LOW (ref >59)
GFR calc non Af Amer: 43 mL/min/{1.73_m2} — ABNORMAL LOW (ref >59)
Globulin, Total: 2.9 g/dL (ref 1.5–4.5)
Glucose: 186 mg/dL — ABNORMAL HIGH (ref 65–99)
Potassium: 4.4 mmol/L (ref 3.5–5.2)
Sodium: 141 mmol/L (ref 134–144)
Total Protein: 7.7 g/dL (ref 6.0–8.5)

## 2020-01-02 LAB — CBC
Hematocrit: 40.7 % (ref 37.5–51.0)
Hemoglobin: 13.6 g/dL (ref 13.0–17.7)
MCH: 28.5 pg (ref 26.6–33.0)
MCHC: 33.4 g/dL (ref 31.5–35.7)
MCV: 85 fL (ref 79–97)
Platelets: 210 10*3/uL (ref 150–450)
RBC: 4.78 x10E6/uL (ref 4.14–5.80)
RDW: 12.6 % (ref 11.6–15.4)
WBC: 5.5 10*3/uL (ref 3.4–10.8)

## 2020-01-02 LAB — TSH: TSH: 2.5 u[IU]/mL (ref 0.450–4.500)

## 2020-01-02 LAB — MICROALBUMIN, URINE: Microalbumin, Urine: <3 ug/mL

## 2020-01-05 ENCOUNTER — Telehealth: Payer: Self-pay

## 2020-01-05 ENCOUNTER — Telehealth: Payer: Medicare HMO

## 2020-01-05 NOTE — Telephone Encounter (Cosign Needed)
  Chronic Care Management   Outreach Note  01/05/2020 Name: Koa Zoeller MRN: [id number] DOB: 1950-05-30  Referred by: [name], MD Reason for referral : Chronic Care Management (RN CM FU Call  Attempt)   Kylor Valverde is enrolled in a Managed Medicaid Health Plan: No  An unsuccessful telephone outreach was attempted today. The patient was referred to the case management team for assistance with care management and care coordination.   Follow Up Plan: A HIPAA compliant phone message was left for the patient providing contact information and requesting a return call. Telephone follow up appointment with care management team member scheduled for: 02/11/20  Barb Merino, RN, BSN, CCM Care Management Coordinator [facility]  Direct Phone: [phone]

## 2020-01-06 ENCOUNTER — Ambulatory Visit: Payer: Self-pay

## 2020-01-06 ENCOUNTER — Telehealth: Payer: Medicare HMO

## 2020-01-06 ENCOUNTER — Other Ambulatory Visit: Payer: Self-pay

## 2020-01-06 DIAGNOSIS — F325 Major depressive disorder, single episode, in full remission: Secondary | ICD-10-CM

## 2020-01-06 DIAGNOSIS — E1122 Type 2 diabetes mellitus with diabetic chronic kidney disease: Secondary | ICD-10-CM

## 2020-01-06 DIAGNOSIS — F5101 Primary insomnia: Secondary | ICD-10-CM

## 2020-01-06 DIAGNOSIS — N400 Enlarged prostate without lower urinary tract symptoms: Secondary | ICD-10-CM

## 2020-01-09 ENCOUNTER — Telehealth: Payer: Self-pay

## 2020-01-09 NOTE — Progress Notes (Signed)
Chronic Care Management Pharmacy Assistant   Name: Gregory Friedman  MRN: [id number] DOB: 12/07/1950  Reason for Encounter: Disease State- Diabetes and Hypertension Adherence Call.   Patient Questions:   1.  Have you seen any other providers since your last visit? Yes 12/31/2019 Truitt Merle ,NP Pre op Exam  01/01/2020 Minette Brine FNP - Annual Exam    2.  Any changes in your medicines or health? No    PCP : [name], MD  Allergies:  No Known Allergies  Medications: Outpatient Encounter Medications as of 01/09/2020  Medication Sig  . Accu-Chek Softclix Lancets lancets USE AS DIRECTED  . aspirin EC 81 MG tablet Take 81 mg by mouth at bedtime.   . Coenzyme Q10 200 MG capsule Take 200 mg by mouth at bedtime.   . enalapril (VASOTEC) 10 MG tablet TAKE 1 TABLET EVERY DAY  . glucose blood (ACCU-CHEK GUIDE) test strip 1 each by Other route 2 (two) times daily. Use as instructed  . insulin degludec (TRESIBA FLEXTOUCH) 100 UNIT/ML FlexTouch Pen Inject 0.4 mLs (40 Units total) into the skin daily. 55 unites per day (Patient taking differently: Inject 40 Units into the skin daily.)  . Insulin Pen Needle 32G X 4 MM MISC 1 Device by Does not apply route in the morning, at noon, in the evening, and at bedtime.  [name] (OMEGA-3) 500 MG CAPS Take 1 capsule by mouth daily. (Patient not taking: Reported on 01/01/2020)  . meclizine (ANTIVERT) 25 MG tablet TAKE 1 TABLET THREE TIMES DAILY FOR DIZZINESS  . melatonin 1 MG TABS tablet Take 1 mg by mouth at bedtime. (Patient not taking: Reported on 01/01/2020)  . metoprolol succinate (TOPROL-XL) 50 MG 24 hr tablet TAKE 1 TABLET EVERY DAY (NEW DIRECTIONS)  . Misc Natural Products (TART CHERRY ADVANCED PO) Take by mouth.  . nitroGLYCERIN (NITROSTAT) 0.4 MG SL tablet Place 1 tablet (0.4 mg total) under the tongue every 5 (five) minutes as needed. Chest pain  . omeprazole (PRILOSEC) 40 MG capsule TAKE 1 CAPSULE EVERY DAY BEFORE A MEAL  (Patient not taking: Reported on 01/01/2020)  . simvastatin (ZOCOR) 40 MG tablet TAKE 1 TABLET  EVERY EVENING.  . tamsulosin (FLOMAX) 0.4 MG CAPS capsule daily in the afternoon.  . triamcinolone cream (KENALOG) 0.1 % Apply 1 application topically 3 (three) times daily. Apply to rash bid x 7-10 days, then prn when rash return (Patient not taking: Reported on 01/01/2020)   No facility-administered encounter medications on file as of 01/09/2020.    Current Diagnosis: Patient Active Problem List   Diagnosis Date Noted  . Low back pain 06/11/2019  . Type 2 diabetes mellitus with hyperglycemia, with long-term current use of insulin ([place]) 05/23/2019  . Type 2 diabetes mellitus with stage 3a chronic kidney disease, with long-term current use of insulin ([place]) 05/23/2019  . Dyslipidemia 05/23/2019  . Psoriasis 12/12/2018  . Skin lesion 10/31/2018  . Diabetes mellitus type 2, insulin dependent ([place]) 09/12/2018  . Elevated serum creatinine 09/12/2018  . Hypothyroidism 10/10/2017  . Atherosclerosis of aorta ([place]) 10/10/2017  . Unspecified skin changes 10/10/2017  . Other long term (current) drug therapy 10/10/2017  . Abdominal pain 09/21/2017  . Nausea & vomiting 09/21/2017  . History of head and neck cancer 09/21/2017  . History of colonic polyps 04/04/2016  . Dysphagia 04/04/2016  . Bloating 04/04/2016  . Diarrhea 04/04/2016  . Trigger finger of right thumb 08/23/2015  . Chest pain 07/15/2015  . Pain in  the chest   . S/P CABG (coronary artery bypass graft)   . Noncompliance with medications 07/08/2015  . Acute gout 04/19/2015  . GERD (gastroesophageal reflux disease) 05/14/2014  . Depression, major, in remission ([place]) 03/27/2013  . Allergic rhinitis, cause unspecified 03/27/2013  . 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 Depressive disorder, not elsewhere classified 12/06/2011  . Nonspecific (abnormal) findings on radiological and other examination of gastrointestinal  tract 02/10/2011  . Dysphagia, unspecified(787.20) 02/10/2011  . Palpitations 02/02/2011  . Dizziness 07/15/2010  . DIAB W/OPHTH MANIFESTS TYPE I [JUV TYPE] UNCNTRL 08/02/2009  . Mixed hyperlipidemia 08/02/2009  . Essential hypertension 08/02/2009  . CAD (coronary artery disease) 08/02/2009   Recent Relevant Labs: Lab Results  Component Value Date/Time   HGBA1C 8.4 (H) 01/01/2020 02:57 PM   HGBA1C 9.3 (H) 09/18/2019 04:26 PM   MICROALBUR 10 10/31/2018 10:57 AM   MICROALBUR 1.0 04/19/2015 12:01 AM   MICROALBUR 0.2 05/14/2014 12:01 AM    Kidney Function Lab Results  Component Value Date/Time   CREATININE 1.59 (H) 01/01/2020 02:57 PM   CREATININE 1.48 (H) 09/18/2019 04:26 PM   CREATININE 1.41 (H) 04/19/2015 12:01 AM   CREATININE 1.35 05/14/2014 12:01 AM   CREATININE 1.4 (H) 05/20/2013 10:58 AM   CREATININE 1.3 11/14/2012 09:19 AM   GFRNONAA 43 (L) 01/01/2020 02:57 PM   GFRAA 50 (L) 01/01/2020 02:57 PM    . Current antihyperglycemic regimen:  o Tresiba 40 units daily  . What recent interventions/DTPs have been made to improve glycemic control:  o Patient states that he has been taking his medications as directed by provider.  . Have there been any recent hospitalizations or ED visits since last visit with CPP? No  . Patient denies hypoglycemic symptoms, including Pale, Sweaty, Shaky, Hungry, Nervous/irritable and Vision changes . Patient denies hyperglycemic symptoms, including blurry vision, excessive thirst, fatigue, polyuria and weakness   . How often are you checking your blood sugar? Patient is checking his blood sugar every day fasting.  . What are your blood sugars ranging?  o Fasting: 12/31/2019- 107 o Fasting: 01/01/2020 - 93 o Fasting: 01/02/2020 - 95 o Fasting  01/03/2020 - 103 o Fasting  01/04/2020 - 86 o Fasting  01/05/2020 - 121 o Fasting  01/06/2020 - 104 o Fasting 01/07/2020 - 99 o Fasting 01/08/2020 -117 o Before meals: None o After meals:  None o Bedtime: None  . During the week, how often does your blood glucose drop below 70? None  . Are you checking your feet daily/regularly? Yes, Patient denies any open sores, numbness, tingling or any pain.  Adherence Review: Is the patient currently on a STATIN medication? Yes Is the patient currently on ACE/ARB medication? No  Does the patient have >5 day gap between last estimated fill dates? No  Reviewed chart prior to disease state call. Spoke with patient regarding BP  Recent Office Vitals: BP Readings from Last 3 Encounters:  01/01/20 (!) 144/80  12/31/19 130/72  09/18/19 128/78   Pulse Readings from Last 3 Encounters:  01/01/20 (!) 59  12/31/19 (!) 50  09/18/19 (!) 54    Wt Readings from Last 3 Encounters:  01/01/20 197 lb 12.8 oz (89.7 kg)  12/31/19 199 lb (90.3 kg)  09/18/19 193 lb 12.8 oz (87.9 kg)     Kidney Function Lab Results  Component Value Date/Time   CREATININE 1.59 (H) 01/01/2020 02:57 PM   CREATININE 1.48 (H) 09/18/2019 04:26 PM   CREATININE 1.41 (H) 04/19/2015 12:01  AM   CREATININE 1.35 05/14/2014 12:01 AM   CREATININE 1.4 (H) 05/20/2013 10:58 AM   CREATININE 1.3 11/14/2012 09:19 AM   GFRNONAA 43 (L) 01/01/2020 02:57 PM   GFRAA 50 (L) 01/01/2020 02:57 PM    BMP Latest Ref Rng & Units 01/01/2020 09/18/2019 07/03/2019  Glucose 65 - 99 mg/dL 186(H) 197(H) 263(H)  BUN 8 - 27 mg/dL 11 16 16   Creatinine 0.76 - 1.27 mg/dL 1.59(H) 1.48(H) 1.54(H)  BUN/Creat Ratio 10 - 24 7(L) 11 -  Sodium 134 - 144 mmol/L 141 139 138  Potassium 3.5 - 5.2 mmol/L 4.4 4.3 4.3  Chloride 96 - 106 mmol/L 100 99 102  CO2 20 - 29 mmol/L 26 27 26   Calcium 8.6 - 10.2 mg/dL 9.8 9.7 9.4    . Current antihypertensive regimen:  Enalapril 10 mg daily Metoprolol succinate 25 mg 2 tablets daily  . How often are you checking your Blood Pressure? Daily sometime more.  . Current home BP readings: 119/66 /  126/63 / 125/60 /141/68  120/74 / 126/73  . What recent  interventions/DTPs have been made by any provider to improve Blood Pressure control since last CPP Visit: Patient states he has been taking his medications as directed by provider.  . Any recent hospitalizations or ED visits since last visit with CPP? No  . What diet changes have been made to improve Blood Pressure Control?               Goal -Ensure daily salt intake < 2300 mg/day- Patient has been watching his salt intake and has been eating healthier.   . What exercise is being done to improve your Blood Pressure Control?   Goal - Exercise for 30 minutes daily 5 times per week - Patient has been doing six miles a day on his stationary bike/ and also using weights.    Adherence Review: Is the patient currently on ACE/ARB medication? No Does the patient have >5 day gap between last estimated fill dates? No   Goals Addressed            This Visit's Progress   . Pharmacy Care Plan   On track    CARE PLAN ENTRY (see longitudinal plan of care for additional care plan information)  Current Barriers:  . Chronic Disease Management support, education, and care coordination needs related to Hypertension, Hyperlipidemia, and Diabetes   Hypertension BP Readings from Last 3 Encounters:  06/26/19 134/82  06/11/19 140/90  05/23/19 (!) 144/82   . Pharmacist Clinical Goal(s): o Over the next 180 days, patient will work with PharmD and providers to maintain BP goal <130/80 . Current regimen:  o Enalapril 10mg  daily o Metoprolol succinate 25mg  2 tablets daily . Interventions: o Provided dietary and exercise recommendations . Patient self care activities - Over the next 180 days, patient will: o Check BP daily, document, and provide at future appointments o Ensure daily salt intake < 2300 mg/day o Exercise for 30 minutes daily 5 times per week  Hyperlipidemia Lab Results  Component Value Date/Time   LDLCALC 57 04/17/2019 03:17 PM   . Pharmacist Clinical Goal(s): o Over the next  90 days, patient will work with PharmD and providers to maintain LDL goal < 70 . Current regimen:  o Simvastatin 40mg  daily . Interventions: o Provided dietary and exercise recommendations - Increase healthy fat intake, decrease fatty foods o Recommend recheck thyroid hormones at next PCP appointment . Patient self care activities - Over the next 90  days, patient will: o Increase intake of healthy fats (Avocados, walnuts, flaxseed, etc) o Limit intake of fatty foods, fired, foods, and junk foods o Exercise at least 30 minutes 5 times a week  Diabetes Lab Results  Component Value Date/Time   HGBA1C 8.6 (H) 07/03/2019 04:22 PM   HGBA1C 9.4 (H) 03/27/2019 05:25 PM   . Pharmacist Clinical Goal(s): o Over the next 120 days, patient will work with PharmD and providers to achieve A1c goal <7% . Current regimen:  o Tresiba 40 units daily . Interventions: o Provided dietary and exercise recommendations (mailed handouts to patient) o Discuss titration of Tresiba to 45 units daily and reassess in two weeks. o Assisted with obtaining refill for Tresiba from [employer] patient assistance program o Reviewed patient's most recent blood glucose readings . Patient self care activities - Over the next 60 days, patient will: o Check blood sugar once daily, document, and provide at future appointments o Contact provider with any episodes of hypoglycemia o Increase intake of lean protein o Try PLATE method for meal planning  Medication management . Pharmacist Clinical Goal(s): o Over the next 90 days, patient will work with PharmD and providers to maintain optimal medication adherence . Current pharmacy: [facility] . Interventions o Comprehensive medication review performed. o Continue current medication management strategy . Patient self care activities - Over the next 90 days, patient will: o Focus on medication adherence by consider use of a pill box to organize medications o Take  medications as prescribed o Report any questions or concerns to PharmD and/or provider(s)  Please see past updates related to this goal by clicking on the "Past Updates" button in the selected goal         Follow-Up:  Pharmacist Review   [name], CPP Notified  Judithann Sheen, [place] Pharmacist Assistant [phone]

## 2020-01-12 NOTE — Chronic Care Management (AMB) (Signed)
Chronic Care Management   Follow Up Note   01/06/2020 Name: Gregory Friedman MRN: [id number] DOB: [birth year]  Referred by: [name], MD Reason for referral : Chronic Care Management (RN CM FU Call )   Gregory Friedman is a 69 y.o. year old male who is a primary care patient of [name], MD. The CCM team was consulted for assistance with chronic disease management and care coordination needs.    Review of patient status, including review of consultants reports, relevant laboratory and other test results, and collaboration with appropriate care team members and the patient's provider was performed as part of comprehensive patient evaluation and provision of chronic care management services.    SDOH (Social Determinants of Health) assessments performed: Yes See Care Plan activities for detailed interventions related to [place])   Completed CCM RN CM outbound follow up call to patient for a care plan update.     Outpatient Encounter Medications as of 01/06/2020  Medication Sig  . Accu-Chek Softclix Lancets lancets USE AS DIRECTED  . aspirin EC 81 MG tablet Take 81 mg by mouth at bedtime.   . Coenzyme Q10 200 MG capsule Take 200 mg by mouth at bedtime.   . enalapril (VASOTEC) 10 MG tablet TAKE 1 TABLET EVERY DAY  . glucose blood (ACCU-CHEK GUIDE) test strip 1 each by Other route 2 (two) times daily. Use as instructed  . insulin degludec (TRESIBA FLEXTOUCH) 100 UNIT/ML FlexTouch Pen Inject 0.4 mLs (40 Units total) into the skin daily. 55 unites per day (Patient taking differently: Inject 40 Units into the skin daily.)  . Insulin Pen Needle 32G X 4 MM MISC 1 Device by Does not apply route in the morning, at noon, in the evening, and at bedtime.  [name] (OMEGA-3) 500 MG CAPS Take 1 capsule by mouth daily. (Patient not taking: Reported on 01/01/2020)  . meclizine (ANTIVERT) 25 MG tablet TAKE 1 TABLET THREE TIMES DAILY FOR DIZZINESS  . melatonin 1 MG TABS tablet Take 1 mg by  mouth at bedtime. (Patient not taking: Reported on 01/01/2020)  . metoprolol succinate (TOPROL-XL) 50 MG 24 hr tablet TAKE 1 TABLET EVERY DAY (NEW DIRECTIONS)  . Misc Natural Products (TART CHERRY ADVANCED PO) Take by mouth.  . nitroGLYCERIN (NITROSTAT) 0.4 MG SL tablet Place 1 tablet (0.4 mg total) under the tongue every 5 (five) minutes as needed. Chest pain  . omeprazole (PRILOSEC) 40 MG capsule TAKE 1 CAPSULE EVERY DAY BEFORE A MEAL (Patient not taking: Reported on 01/01/2020)  . simvastatin (ZOCOR) 40 MG tablet TAKE 1 TABLET  EVERY EVENING.  . tamsulosin (FLOMAX) 0.4 MG CAPS capsule daily in the afternoon.  . triamcinolone cream (KENALOG) 0.1 % Apply 1 application topically 3 (three) times daily. Apply to rash bid x 7-10 days, then prn when rash return (Patient not taking: Reported on 01/01/2020)   No facility-administered encounter medications on file as of 01/06/2020.     Objective:  Lab Results  Component Value Date   HGBA1C 8.4 (H) 01/01/2020   HGBA1C 9.3 (H) 09/18/2019   HGBA1C 8.6 (H) 07/03/2019   Lab Results  Component Value Date   MICROALBUR 10 10/31/2018   LDLCALC 85 09/18/2019   CREATININE 1.59 (H) 01/01/2020   BP Readings from Last 3 Encounters:  01/01/20 (!) 144/80  12/31/19 130/72  09/18/19 128/78    Goals Addressed     Patient Care Plan: Chronic Kidney (Adult)    Problem Identified: Disease Progression   Priority: High  Long-Range Goal: Disease Progression Prevented or Minimized   Start Date: 01/06/2020  Expected End Date: 04/05/2020  This Visit's Progress: On track  Priority: High  Note:   Current Barriers:   Ineffective Self Health Maintenance  Currently UNABLE TO independently self manage needs related to chronic health conditions.   Knowledge Deficits related to short term plan for care coordination needs and long term plans for chronic disease management needs Nurse Case Manager Clinical Goal(s):   Over the next 90 days, patient will work  with care management team to address care coordination and chronic disease management needs related to Disease Management  Educational Needs  Care Coordination  Medication Management and Education  Medication Assistance   Psychosocial Support   Interventions:    Discuss potential for disease progression based on clinical diagnosis and/or causation and other risk factors including race, ethnicity, age, comorbidities, lifestyle.   Encourage lifestyle changes including maintaining a healthy weight, exercise and activity, limiting alcohol consumption, smoking cessation to improve long-term outcomes.  Patient Goals/Self Care Activities:  - call for medicine refill 2 or 3 days before it runs out - keep follow-up appointments - set alarm to remind to take medications  Follow Up Plan: Telephone follow up appointment with care management team member scheduled for: 02/11/20   Patient Care Plan: Wellness (Adult)    Problem Identified: Medication Adherence (Wellness)   Priority: High    Goal: Medication Adherence Maintained   Start Date: 01/06/2020  Expected End Date: 03/08/2020  This Visit's Progress: On track  Priority: High  Note:   Current Barriers:   Ineffective Self Health Maintenance  Currently UNABLE TO independently self manage needs related to chronic health conditions.   Knowledge Deficits related to short term plan for care coordination needs and long term plans for chronic disease management needs Nurse Case Manager Clinical Goal(s):   Over the next 90 days, patient will work with care management team to address care coordination and chronic disease management needs related to Disease Management  Educational Needs  Care Coordination  Medication Management and Education  Medication Assistance   Psychosocial Support   Interventions:   Review all medications to determine if patient or caregiver knows why the medications are given and if taken as prescribed.    Complete or review a medication adherence assessment including barriers to medication adherence.   Arrange and encourage counseling and medication review by pharmacist.   Assess barriers to medication adherence.   Encourage the use of medication reminders such as clock or cell phone alarm, color coding, pillboxes for am/pm and days of the week, pharmacy refill reminder, auto-refill system or mail-order option.   Assess presence of side effects; provide suggestions to manage or reduce side effects.  Patient Goals/Self Care Activities:  Over the next 90 days, patient will:  -consult with embedded Pharm D to discuss cost assistance for [name]  -patient will continue to take [place] as prescribed w/o missed doses -patient will ask PCP for [place] drug samples if needed       Follow Up Plan: Telephone follow up appointment with care management team member scheduled for: 02/11/20   Patient Care Plan: Diabetes Type 2 (Adult)    Problem Identified: Glycemic Management (Diabetes, Type 2)     Long-Range Goal: Glycemic Management Optimized   Start Date: 01/06/2020  Expected End Date: 04/05/2020  This Visit's Progress: On track  Priority: High  Note:   Objective:  Lab Results  Component Value Date   HGBA1C 8.4 (H)  01/01/2020 .   Lab Results  Component Value Date   CREATININE 1.59 (H) 01/01/2020   CREATININE 1.48 (H) 09/18/2019   CREATININE 1.54 (H) 07/03/2019 .   [name] No results found for: EGFR Current Barriers:  [name] Knowledge Deficits related to basic Diabetes pathophysiology and self care/management . Knowledge Deficits related to medications used for management of diabetes . Financial Constraints Case Manager Clinical Goal(s):  [name] Over the next 90 days, patient will demonstrate improved adherence to prescribed treatment plan for diabetes self care/management as evidenced by:  . daily monitoring and recording of CBG  . adherence to ADA/ carb modified diet . exercise 5  days/week . adherence to prescribed medication regimen Interventions:  . Provided education to patient about basic DM disease process . Reviewed medications with patient and discussed importance of medication adherence . Discussed plans with patient for ongoing care management follow up and provided patient with direct contact information for care management team . Advised patient, providing education and rationale, to check cbg before meals and bedtime, before and after exercise and record, calling CCM team and or PCP for findings outside established parameters.   . Review of patient status, including review of consultants reports, relevant laboratory and other test results, and medications completed.  Compare self-reported symptoms of hypo or hyperglycemia to blood glucose levels, diet and fluid intake, current medications, psychosocial and physiologic stressors, change in activity and barriers to care adherence.   Promote self-monitoring of blood glucose levels.  Patient Goals/Self-Care Activities . Over the next 90 days, patient will:  - Self administers oral medications as prescribed Self administers injectable DM medication [name]) as prescribed Attends all scheduled provider appointments Checks blood sugars as prescribed and utilize hyper and hypoglycemia protocol as needed Adheres to prescribed ADA/carb modified - check blood sugar at prescribed times - check blood sugar before and after exercise - check blood sugar if I feel it is too high or too low - enter blood sugar readings and medication or insulin into daily log - take the blood sugar log to all doctor visits - take the blood sugar meter to all doctor visits  Follow Up Plan: Telephone follow up appointment with care management team member scheduled for: 02/11/20   Problem Identified: Disease Progression (Diabetes, Type 2)   Priority: High    Long-Range Goal: Disease Progression Prevented or Minimized   Start Date:  01/06/2020  Expected End Date: 04/05/2020  This Visit's Progress: On track  Priority: High  Note:   CARE PLAN ENTRY (see longtitudinal plan of care for additional care plan information)  Objective:  Lab Results  Component Value Date   HGBA1C 8.4 (H) 01/01/2020  Objective:  Lab Results  Component Value Date   HGBA1C 8.4 (H) 01/01/2020 .   Lab Results  Component Value Date   CREATININE 1.59 (H) 01/01/2020   CREATININE 1.48 (H) 09/18/2019   CREATININE 1.54 (H) 07/03/2019 .   [name] No results found for: EGFR Current Barriers:  [name] Knowledge Deficits related to basic Diabetes pathophysiology and self care/management . Knowledge Deficits related to medications used for management of diabetes . Difficulty obtaining or cannot afford medications . Financial Constraints Case Manager Clinical Goal(s):  [name] Over the next 90 days, patient will demonstrate improved adherence to prescribed treatment plan for diabetes self care/management as evidenced by: taking [place] exactly as prescribed w/o missed doses Interventions:  . Provided education to patient about basic DM disease process . Reviewed medications with patient and discussed importance of medication  adherence . Discussed plans with patient for ongoing care management follow up and provided patient with direct contact information for care management team . Referral made to pharmacy team for assistance with cost assistance for [place] . Review of patient status, including review of consultants reports, relevant laboratory and other test results, and medications completed. Patient Goals/Self-Care Activities . Over the next 90 days, patient will:  - Self administers oral medications as prescribed Self administers injectable DM medication [name]) as prescribed Attends all scheduled provider appointments - check blood sugar at prescribed times - check blood sugar before and after exercise - check blood sugar if I feel it is too high or too  low - enter blood sugar readings and medication or insulin into daily log - take the blood sugar log to all doctor visits - take the blood sugar meter to all doctor visits  Follow Up Plan: Telephone follow up appointment with care management team member scheduled for: 02/11/20    Patient Care Plan: Urinary Incontinence (Adult)    Problem Identified: Disorder Identification (Urinary Incontinence)   Priority: High    Goal: Urinary Incontinence Identified   Start Date: 01/06/2020  Expected End Date: 02/06/2020  This Visit's Progress: On track  Priority: High  Note:   Current Barriers:   Ineffective Self Health Maintenance  Currently UNABLE TO independently self manage needs related to chronic health conditions.   Knowledge Deficits related to short term plan for care coordination needs and long term plans for chronic disease management needs Nurse Case Manager Clinical Goal(s):   Over the next 30 days, patient will work with care management team to address care coordination and chronic disease management needs related to Disease Management  Educational Needs  Care Coordination  Medication Management and Education  Medication Assistance   Psychosocial Support   Interventions:   Determine severity and nature of incontinence, such as stress, urge or mixed incontinence) to assist in definitive diagnosis.   Determined patient is scheduled to undergo a TURP per Dr. Abner Greenspan, Urologist on 1/7/25f  Determined patient is able to verbalize a good understanding about this procedure and what to expect post surgery  Determined patient understands he should stop his ASA 5 days prior to the procedure per Dr. Abner Greenspan Patient Goals/Self Care Activities:  Over the next 30 days, patient will:  -undergo a transurethral resection of the prostate as scheduled, 01/30/20 -hold ASA 5 days prior to this procedure as directed by Urologist   Follow Up Plan: Telephone follow up appointment with care  management team member scheduled for: 02/11/20    Plan:   Telephone follow up appointment with care management team member scheduled for: 02/11/20  Barb Merino, RN, BSN, CCM Care Management Coordinator [facility]  Direct Phone: [phone]

## 2020-01-12 NOTE — Patient Instructions (Signed)
Visit Information  Goals Addressed      Other   .  Follow My Treatment Plan-Chronic Kidney        Timeframe:  Long-Range Goal Priority:  High Start Date: 01/06/20                            Expected End Date:  04/05/20                    Follow Up Date 02/11/20   - call for medicine refill 2 or 3 days before it runs out - keep follow-up appointments - set alarm to remind to take medications    Why is this important?    Staying as healthy as you can is very important. This may mean making changes if you smoke, don't exercise or eat poorly.   A healthy lifestyle is an important goal for you.   Following the treatment plan and making changes may be hard.   Try some of these steps to help keep the disease from getting worse.     Notes:     [name]  Monitor and Manage My Blood Sugar-Diabetes Type 2        Timeframe:  Long-Range Goal Priority:  High Start Date: 01/06/20                            Expected End Date: 04/05/20                      Follow Up Date 02/11/20    - check blood sugar at prescribed times - check blood sugar before and after exercise - check blood sugar if I feel it is too high or too low - enter blood sugar readings and medication or insulin into daily log - take the blood sugar log to all doctor visits - take the blood sugar meter to all doctor visits    Why is this important?    Checking your blood sugar at home helps to keep it from getting very high or very low.   Writing the results in a diary or log helps the doctor know how to care for you.   Your blood sugar log should have the time, date and the results.   Also, write down the amount of insulin or other medicine that you take.   Other information, like what you ate, exercise done and how you were feeling, will also be helpful.     Notes:     .  TRANSURETHRAL RESECTION OF THE PROSTATE (TURP)        Timeframe:  Short-Term Goal Priority:  High Start Date: 01/06/20                             Expected End Date: 02/06/20                     Follow up call: 02/11/20  Over the next 30 days, patient will:  -undergo a transurethral resection of the prostate as scheduled, 01/30/20 -hold ASA 5 days prior to this procedure as directed by Urologist      .  Work with embedded Pharm D for drug cost assistance        Timeframe:  Short-Term Goal Priority:  High Start Date: 01/06/20  Expected End Date:  03/08/20  Follow up date: 02/11/20  Over the next 90 days, patient will:  -consult with embedded Pharm D to discuss cost assistance for [name]  -patient will continue to take [place] as prescribed w/o missed doses -patient will ask PCP for [place] drug samples if needed                            The patient verbalized understanding of instructions, educational materials, and care plan provided today and declined offer to receive copy of patient instructions, educational materials, and care plan.   Telephone follow up appointment with care management team member scheduled for: 02/11/20  Lynne Logan, RN

## 2020-01-13 ENCOUNTER — Telehealth: Payer: Self-pay | Admitting: *Deleted

## 2020-01-13 NOTE — Chronic Care Management (AMB) (Signed)
  Care Management   Note  01/13/2020 Name: Gregory Friedman MRN: [id number] DOB: 12-28-1950  Gregory Friedman is a 69 y.o. year old male who is a primary care patient of [name], MD and is actively engaged with the care management team. I reached out to [name] by phone today to assist with scheduling a follow up visit with the Pharmacist  Follow up plan: Unsuccessful telephone outreach attempt made. The care management team will reach out to the patient again over the next 7 days. If patient returns call to provider office, please advise to call [place] at [phone].  [place]  Direct Dial [phone]

## 2020-01-20 NOTE — Chronic Care Management (AMB) (Signed)
  Chronic Care Management   Note  01/20/2020 Name: Gregory Friedman MRN: [id number] DOB: 12/23/1950  Gregory Friedman is a 69 y.o. year old male who is a primary care patient of Dorothyann Peng, MD. Gregory Friedman is currently enrolled in care management services. An additional referral for Pharmacy was placed.   Follow up plan: A second unsuccessful telephone outreach attempt made. The care management team will reach out to the patient again over the next 7 days. If patient returns call to provider office, please advise to call Embedded Care Management Care Guide Gwenevere Ghazi at [phone].  Gwenevere Ghazi  Care Guide, Embedded Care Coordination [facility]  Direct Dial: [phone]

## 2020-01-21 NOTE — Chronic Care Management (AMB) (Signed)
  Care Management   Note  01/21/2020 Name: Gregory Friedman MRN: [id number] DOB: January 19, 1951  Gregory Friedman is a 69 y.o. year old male who is a primary care patient of Dorothyann Peng, MD and is actively engaged with the care management team. I reached out to Hassell Done by phone today to assist with scheduling a follow up visit with the Pharmacist  Follow up plan: Telephone appointment with care management team member scheduled for:02/05/2020  [facility] Guide, Embedded Care Coordination [facility]

## 2020-01-27 ENCOUNTER — Other Ambulatory Visit (HOSPITAL_COMMUNITY)
Admission: RE | Admit: 2020-01-27 | Discharge: 2020-01-27 | Disposition: A | Discharge status: Home / Self Care | Payer: Medicare HMO | Source: Ambulatory Visit | Attending: Urology | Admitting: Urology

## 2020-01-27 DIAGNOSIS — Z20822 Contact with and (suspected) exposure to covid-19: Secondary | ICD-10-CM | POA: Insufficient documentation

## 2020-01-27 DIAGNOSIS — Z01812 Encounter for preprocedural laboratory examination: Secondary | ICD-10-CM | POA: Insufficient documentation

## 2020-01-27 LAB — SARS CORONAVIRUS 2 (TAT 6-24 HRS): SARS Coronavirus 2: NEGATIVE

## 2020-01-28 ENCOUNTER — Encounter (HOSPITAL_BASED_OUTPATIENT_CLINIC_OR_DEPARTMENT_OTHER): Payer: Self-pay | Admitting: Urology

## 2020-01-28 ENCOUNTER — Other Ambulatory Visit: Payer: Self-pay

## 2020-01-28 NOTE — Progress Notes (Addendum)
ADDENDUM:  Pt arrived today for anesthesia airway evaluation.  Evaluation by Dr Eilene Ghazi MDA,  Stated ok to proceed with surgery.   ADDENDUM:  Chart reviewed by anesthesia, Jodell Cipro PA,  Stated pt needs to come in for  anesthesia airway evaluation.  Called and spoke w/ pt via phone, pt verbalized understanding why he needs to come in and appointment made for 01-29-2020 @ 1300, Jessica aware.   Spoke w/ via phone for pre-op interview--- PT Lab needs dos---- Istat              Lab results------ current ekg in epic/ chart COVID test ------ done 01-27-2020 negative result in epic Arrive at ------- 1115 on 01-30-2020 NPO after MN NO Solid Food.  Clear liquids from MN until--- 1015 Medications to take morning of surgery ----- Prilosec Diabetic medication ----- do half dose tresiba insulin night before surgery Patient Special Instructions ----- reviewed RCC and visitor guidelines in epic Pre-Op special Istructions ----- pt has cardiac clearance in lov note dated 12-31-2019 by Norma Fredrickson NP Patient verbalized understanding of instructions that were given at this phone interview. Patient denies shortness of breath, chest pain, fever, cough at this phone interview.   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  Chart to be reviewed by anesthesia, Jodell Cipro PA.  PCP:  Dr Elvera Lennox. Allyne Gee [facility] 01-01-2020 epic) Cardiologist :  Dr Demetrius Charity. Tenny Craw (lov 12-31-2019 epic) Chest x-ray : 09-20-2017 epic CT neck:  06-08-2016 epic (no recurrence) EKG :  12-31-2019  epic Echo :  01-04-2016 epic Event monitor:  12-22-2015 epic Stress test:  07-15-2015 epic Cardiac Cath :   05-25-2010 epic Activity level:   Pt denies any sob with any activities Sleep Study/ CPAP :  NO Fasting Blood Sugar :  100--130    / Checks Blood Sugar -- times a day:   Daily in am Blood Thinner/ Instructions Maurice Small Dose:  NO ASA / Instructions/ Last Dose : ASA 81mg ;  Per pt was given instructions to stop by cardiology , stated last dose 01-26-2020

## 2020-01-29 ENCOUNTER — Encounter (HOSPITAL_COMMUNITY)
Admission: RE | Admit: 2020-01-29 | Discharge: 2020-01-29 | Disposition: A | Discharge status: Home / Self Care | Payer: Medicare HMO | Source: Ambulatory Visit | Attending: Urology | Admitting: Urology

## 2020-01-30 ENCOUNTER — Ambulatory Visit (HOSPITAL_BASED_OUTPATIENT_CLINIC_OR_DEPARTMENT_OTHER)
Admission: RE | Admit: 2020-01-30 | Discharge: 2020-01-31 | Disposition: A | Discharge status: Home / Self Care | Payer: Medicare HMO | Attending: Urology | Admitting: Urology

## 2020-01-30 ENCOUNTER — Other Ambulatory Visit: Payer: Self-pay

## 2020-01-30 ENCOUNTER — Encounter (HOSPITAL_BASED_OUTPATIENT_CLINIC_OR_DEPARTMENT_OTHER)
Admission: RE | Disposition: A | Discharge status: Home / Self Care | Payer: Self-pay | Source: Home / Self Care | Attending: Urology

## 2020-01-30 ENCOUNTER — Ambulatory Visit (HOSPITAL_BASED_OUTPATIENT_CLINIC_OR_DEPARTMENT_OTHER): Payer: Medicare HMO | Admitting: Physician Assistant

## 2020-01-30 ENCOUNTER — Encounter (HOSPITAL_BASED_OUTPATIENT_CLINIC_OR_DEPARTMENT_OTHER): Payer: Self-pay | Admitting: Urology

## 2020-01-30 DIAGNOSIS — E1122 Type 2 diabetes mellitus with diabetic chronic kidney disease: Secondary | ICD-10-CM | POA: Insufficient documentation

## 2020-01-30 DIAGNOSIS — N400 Enlarged prostate without lower urinary tract symptoms: Secondary | ICD-10-CM | POA: Diagnosis present

## 2020-01-30 DIAGNOSIS — N138 Other obstructive and reflux uropathy: Secondary | ICD-10-CM | POA: Insufficient documentation

## 2020-01-30 DIAGNOSIS — N401 Enlarged prostate with lower urinary tract symptoms: Secondary | ICD-10-CM | POA: Diagnosis not present

## 2020-01-30 DIAGNOSIS — C61 Malignant neoplasm of prostate: Secondary | ICD-10-CM | POA: Diagnosis not present

## 2020-01-30 DIAGNOSIS — N183 Chronic kidney disease, stage 3 unspecified: Secondary | ICD-10-CM | POA: Insufficient documentation

## 2020-01-30 DIAGNOSIS — N320 Bladder-neck obstruction: Secondary | ICD-10-CM | POA: Diagnosis not present

## 2020-01-30 DIAGNOSIS — F418 Other specified anxiety disorders: Secondary | ICD-10-CM | POA: Diagnosis not present

## 2020-01-30 DIAGNOSIS — E782 Mixed hyperlipidemia: Secondary | ICD-10-CM | POA: Diagnosis not present

## 2020-01-30 DIAGNOSIS — Z951 Presence of aortocoronary bypass graft: Secondary | ICD-10-CM | POA: Diagnosis not present

## 2020-01-30 DIAGNOSIS — I129 Hypertensive chronic kidney disease with stage 1 through stage 4 chronic kidney disease, or unspecified chronic kidney disease: Secondary | ICD-10-CM | POA: Diagnosis not present

## 2020-01-30 HISTORY — DX: Personal history of colonic polyps: Z86.010

## 2020-01-30 HISTORY — DX: Other symptoms and signs involving the musculoskeletal system: R29.898

## 2020-01-30 HISTORY — DX: Bradycardia, unspecified: R00.1

## 2020-01-30 HISTORY — DX: Mixed hyperlipidemia: E78.2

## 2020-01-30 HISTORY — DX: Type 2 diabetes mellitus without complications: Z79.4

## 2020-01-30 HISTORY — DX: Personal history of colon polyps, unspecified: Z86.0100

## 2020-01-30 HISTORY — PX: TRANSURETHRAL RESECTION OF PROSTATE: SHX73

## 2020-01-30 HISTORY — DX: Hypothyroidism, unspecified: E03.9

## 2020-01-30 HISTORY — DX: Presence of coronary angioplasty implant and graft: Z95.5

## 2020-01-30 HISTORY — DX: Dizziness and giddiness: R42

## 2020-01-30 HISTORY — DX: Chronic kidney disease, stage 3 unspecified: N18.30

## 2020-01-30 HISTORY — DX: Psoriasis, unspecified: L40.9

## 2020-01-30 HISTORY — DX: Limited mandibular range of motion: M26.52

## 2020-01-30 HISTORY — DX: Type 2 diabetes mellitus without complications: E11.9

## 2020-01-30 HISTORY — DX: Benign prostatic hyperplasia with lower urinary tract symptoms: N40.1

## 2020-01-30 LAB — POCT I-STAT, CHEM 8
BUN: 19 mg/dL (ref 8–23)
Calcium, Ion: 1.19 mmol/L (ref 1.15–1.40)
Chloride: 102 mmol/L (ref 98–111)
Creatinine, Ser: 1.4 mg/dL — ABNORMAL HIGH (ref 0.61–1.24)
Glucose, Bld: 126 mg/dL — ABNORMAL HIGH (ref 70–99)
HCT: 43 % (ref 39.0–52.0)
Hemoglobin: 14.6 g/dL (ref 13.0–17.0)
Potassium: 4.6 mmol/L (ref 3.5–5.1)
Sodium: 142 mmol/L (ref 135–145)
TCO2: 27 mmol/L (ref 22–32)

## 2020-01-30 LAB — GLUCOSE, CAPILLARY
Glucose-Capillary: 117 mg/dL — ABNORMAL HIGH (ref 70–99)
Glucose-Capillary: 126 mg/dL — ABNORMAL HIGH (ref 70–99)
Glucose-Capillary: 118 mg/dL — ABNORMAL HIGH (ref 70–99)

## 2020-01-30 SURGERY — TURP (TRANSURETHRAL RESECTION OF PROSTATE)
Anesthesia: General | Site: Prostate

## 2020-01-30 MED ORDER — MORPHINE SULFATE (PF) 4 MG/ML IV SOLN: 2.0000 mg | INTRAVENOUS | Status: DC | PRN

## 2020-01-30 MED ORDER — CEFAZOLIN SODIUM-DEXTROSE 2-4 GM/100ML-% IV SOLN
2.0000 g | Freq: Once | INTRAVENOUS | Status: AC
  Administered 2020-01-30: 2 g via INTRAVENOUS

## 2020-01-30 MED ORDER — ACETAMINOPHEN 325 MG PO TABS: 650.0000 mg | ORAL_TABLET | ORAL | Status: DC | PRN

## 2020-01-30 MED ORDER — CEFAZOLIN SODIUM-DEXTROSE 2-4 GM/100ML-% IV SOLN
INTRAVENOUS | Status: AC
  Filled 2020-01-30: qty 100

## 2020-01-30 MED ORDER — HEPARIN SODIUM (PORCINE) 5000 UNIT/ML IJ SOLN
INTRAMUSCULAR | Status: AC
  Filled 2020-01-30: qty 1

## 2020-01-30 MED ORDER — COENZYME Q10 200 MG PO CAPS: 200.0000 mg | ORAL_CAPSULE | Freq: Every day | ORAL | Status: DC

## 2020-01-30 MED ORDER — OXYCODONE HCL 5 MG PO TABS
ORAL_TABLET | ORAL | Status: AC
  Filled 2020-01-30: qty 1

## 2020-01-30 MED ORDER — DOCUSATE SODIUM 100 MG PO CAPS
ORAL_CAPSULE | ORAL | Status: AC
  Filled 2020-01-30: qty 1

## 2020-01-30 MED ORDER — SODIUM CHLORIDE 0.9% FLUSH: 3.0000 mL | INTRAVENOUS | Status: DC | PRN

## 2020-01-30 MED ORDER — METOPROLOL SUCCINATE ER 50 MG PO TB24
50.0000 mg | ORAL_TABLET | Freq: Every day | ORAL | Status: DC
  Administered 2020-01-30: 50 mg via ORAL
  Filled 2020-01-30: qty 1

## 2020-01-30 MED ORDER — SODIUM CHLORIDE 0.9 % IV SOLN: 250.0000 mL | INTRAVENOUS | Status: DC | PRN

## 2020-01-30 MED ORDER — FENTANYL CITRATE (PF) 100 MCG/2ML IJ SOLN
INTRAMUSCULAR | Status: AC
  Filled 2020-01-30: qty 2

## 2020-01-30 MED ORDER — INSULIN DETEMIR 100 UNIT/ML ~~LOC~~ SOLN
40.0000 [IU] | Freq: Every day | SUBCUTANEOUS | Status: DC
  Administered 2020-01-30: 40 [IU] via SUBCUTANEOUS
  Filled 2020-01-30: qty 0.4

## 2020-01-30 MED ORDER — TAMSULOSIN HCL 0.4 MG PO CAPS
ORAL_CAPSULE | ORAL | Status: AC
  Filled 2020-01-30: qty 1

## 2020-01-30 MED ORDER — OXYCODONE HCL 5 MG PO TABS
5.0000 mg | ORAL_TABLET | Freq: Once | ORAL | Status: AC | PRN
Start: 2020-01-30 — End: 2020-01-30
  Administered 2020-01-30: 5 mg via ORAL

## 2020-01-30 MED ORDER — BACITRACIN-NEOMYCIN-POLYMYXIN 400-5-5000 EX OINT: 1.0000 "application " | TOPICAL_OINTMENT | Freq: Three times a day (TID) | CUTANEOUS | Status: DC | PRN

## 2020-01-30 MED ORDER — INSULIN ASPART 100 UNIT/ML ~~LOC~~ SOLN
4.0000 [IU] | Freq: Three times a day (TID) | SUBCUTANEOUS | Status: DC
  Administered 2020-01-31: 4 [IU] via SUBCUTANEOUS

## 2020-01-30 MED ORDER — INSULIN ASPART 100 UNIT/ML ~~LOC~~ SOLN: 0.0000 [IU] | Freq: Three times a day (TID) | SUBCUTANEOUS | Status: DC

## 2020-01-30 MED ORDER — PANTOPRAZOLE SODIUM 40 MG PO TBEC
DELAYED_RELEASE_TABLET | ORAL | Status: AC
  Filled 2020-01-30: qty 1

## 2020-01-30 MED ORDER — ONDANSETRON HCL 4 MG/2ML IJ SOLN: 4.0000 mg | Freq: Once | INTRAMUSCULAR | Status: DC | PRN

## 2020-01-30 MED ORDER — DOCUSATE SODIUM 100 MG PO CAPS
100.0000 mg | ORAL_CAPSULE | Freq: Two times a day (BID) | ORAL | Status: DC
  Administered 2020-01-30 (×2): 100 mg via ORAL

## 2020-01-30 MED ORDER — MECLIZINE HCL 25 MG PO TABS
25.0000 mg | ORAL_TABLET | Freq: Three times a day (TID) | ORAL | Status: DC | PRN
  Filled 2020-01-30: qty 1

## 2020-01-30 MED ORDER — OXYCODONE-ACETAMINOPHEN 5-325 MG PO TABS
ORAL_TABLET | ORAL | Status: AC
  Filled 2020-01-30: qty 1

## 2020-01-30 MED ORDER — TAMSULOSIN HCL 0.4 MG PO CAPS
0.4000 mg | ORAL_CAPSULE | Freq: Every day | ORAL | Status: DC
  Administered 2020-01-30: 0.4 mg via ORAL

## 2020-01-30 MED ORDER — EPHEDRINE SULFATE-NACL 50-0.9 MG/10ML-% IV SOSY
PREFILLED_SYRINGE | INTRAVENOUS | Status: DC | PRN
  Administered 2020-01-30: 5 mg via INTRAVENOUS
  Administered 2020-01-30: 10 mg via INTRAVENOUS

## 2020-01-30 MED ORDER — ONDANSETRON HCL 4 MG/2ML IJ SOLN: 4.0000 mg | INTRAMUSCULAR | Status: DC | PRN

## 2020-01-30 MED ORDER — PANTOPRAZOLE SODIUM 40 MG PO TBEC
40.0000 mg | DELAYED_RELEASE_TABLET | Freq: Every day | ORAL | Status: DC
  Administered 2020-01-30: 40 mg via ORAL

## 2020-01-30 MED ORDER — SODIUM CHLORIDE 0.9 % IR SOLN
Status: DC | PRN
  Administered 2020-01-30 (×7): 3000 mL

## 2020-01-30 MED ORDER — OXYCODONE-ACETAMINOPHEN 5-325 MG PO TABS
1.0000 | ORAL_TABLET | ORAL | Status: DC | PRN
Start: 2020-01-30 — End: 2020-01-31
  Administered 2020-01-30 – 2020-01-31 (×3): 1 via ORAL

## 2020-01-30 MED ORDER — DIPHENHYDRAMINE HCL 12.5 MG/5ML PO ELIX: 12.5000 mg | ORAL_SOLUTION | Freq: Four times a day (QID) | ORAL | Status: DC | PRN

## 2020-01-30 MED ORDER — OXYCODONE-ACETAMINOPHEN 5-325 MG PO TABS: 1.0000 | ORAL_TABLET | ORAL | 0 refills | Status: DC | PRN

## 2020-01-30 MED ORDER — HEPARIN SODIUM (PORCINE) 5000 UNIT/ML IJ SOLN
5000.0000 [IU] | Freq: Three times a day (TID) | INTRAMUSCULAR | Status: DC
  Administered 2020-01-30 – 2020-01-31 (×3): 5000 [IU] via SUBCUTANEOUS

## 2020-01-30 MED ORDER — ENALAPRIL MALEATE 10 MG PO TABS
10.0000 mg | ORAL_TABLET | Freq: Every day | ORAL | Status: DC
  Administered 2020-01-30: 10 mg via ORAL
  Filled 2020-01-30: qty 1

## 2020-01-30 MED ORDER — MELATONIN 3 MG PO TABS
1.5000 mg | ORAL_TABLET | Freq: Every day | ORAL | Status: DC
  Administered 2020-01-30: 1.5 mg via ORAL
  Filled 2020-01-30: qty 0.5

## 2020-01-30 MED ORDER — DOCUSATE SODIUM 100 MG PO CAPS: 100.0000 mg | ORAL_CAPSULE | Freq: Every day | ORAL | 0 refills | Status: DC | PRN

## 2020-01-30 MED ORDER — BELLADONNA ALKALOIDS-OPIUM 16.2-60 MG RE SUPP: 1.0000 | Freq: Four times a day (QID) | RECTAL | Status: DC | PRN

## 2020-01-30 MED ORDER — LIDOCAINE 2% (20 MG/ML) 5 ML SYRINGE
INTRAMUSCULAR | Status: DC | PRN
  Administered 2020-01-30: 60 mg via INTRAVENOUS

## 2020-01-30 MED ORDER — SODIUM CHLORIDE 0.45 % IV SOLN: INTRAVENOUS | Status: DC

## 2020-01-30 MED ORDER — SIMVASTATIN 40 MG PO TABS
40.0000 mg | ORAL_TABLET | Freq: Every evening | ORAL | Status: DC
Start: 2020-01-30 — End: 2020-01-31
  Administered 2020-01-30: 40 mg via ORAL
  Filled 2020-01-30: qty 1

## 2020-01-30 MED ORDER — DEXAMETHASONE SODIUM PHOSPHATE 4 MG/ML IJ SOLN
INTRAMUSCULAR | Status: DC | PRN
  Administered 2020-01-30: 10 mg via INTRAVENOUS

## 2020-01-30 MED ORDER — OXYBUTYNIN CHLORIDE ER 10 MG PO TB24
ORAL_TABLET | ORAL | Status: AC
  Filled 2020-01-30: qty 1

## 2020-01-30 MED ORDER — FENTANYL CITRATE (PF) 100 MCG/2ML IJ SOLN
INTRAMUSCULAR | Status: DC | PRN
  Administered 2020-01-30: 50 ug via INTRAVENOUS
  Administered 2020-01-30 (×2): 25 ug via INTRAVENOUS

## 2020-01-30 MED ORDER — FENTANYL CITRATE (PF) 100 MCG/2ML IJ SOLN
25.0000 ug | INTRAMUSCULAR | Status: DC | PRN
Start: 2020-01-30 — End: 2020-01-31
  Administered 2020-01-30: 50 ug via INTRAVENOUS

## 2020-01-30 MED ORDER — MELATONIN 1 MG PO TABS
1.0000 mg | ORAL_TABLET | Freq: Every day | ORAL | Status: DC
  Filled 2020-01-30: qty 1

## 2020-01-30 MED ORDER — OXYCODONE HCL 5 MG/5ML PO SOLN: 5.0000 mg | Freq: Once | ORAL | Status: AC | PRN

## 2020-01-30 MED ORDER — DIPHENHYDRAMINE HCL 50 MG/ML IJ SOLN: 12.5000 mg | Freq: Four times a day (QID) | INTRAMUSCULAR | Status: DC | PRN

## 2020-01-30 MED ORDER — SODIUM CHLORIDE 0.9% FLUSH: 3.0000 mL | Freq: Two times a day (BID) | INTRAVENOUS | Status: DC

## 2020-01-30 MED ORDER — SODIUM CHLORIDE 0.9 % IR SOLN
3000.0000 mL | Status: DC
  Administered 2020-01-30: 3000 mL

## 2020-01-30 MED ORDER — CEPHALEXIN 500 MG PO CAPS: 500.0000 mg | ORAL_CAPSULE | Freq: Four times a day (QID) | ORAL | 0 refills | Status: AC

## 2020-01-30 MED ORDER — ONDANSETRON HCL 4 MG/2ML IJ SOLN
INTRAMUSCULAR | Status: DC | PRN
  Administered 2020-01-30: 4 mg via INTRAVENOUS

## 2020-01-30 MED ORDER — SODIUM CHLORIDE 0.9 % IV SOLN: INTRAVENOUS | Status: DC

## 2020-01-30 MED ORDER — INSULIN ASPART 100 UNIT/ML ~~LOC~~ SOLN: 0.0000 [IU] | Freq: Every day | SUBCUTANEOUS | Status: DC

## 2020-01-30 MED ORDER — NITROGLYCERIN 0.4 MG SL SUBL: 0.4000 mg | SUBLINGUAL_TABLET | SUBLINGUAL | Status: DC | PRN

## 2020-01-30 MED ORDER — PROPOFOL 10 MG/ML IV BOLUS
INTRAVENOUS | Status: DC | PRN
  Administered 2020-01-30: 150 mg via INTRAVENOUS

## 2020-01-30 MED ORDER — ZOLPIDEM TARTRATE 5 MG PO TABS: 5.0000 mg | ORAL_TABLET | Freq: Every evening | ORAL | Status: DC | PRN

## 2020-01-30 MED ORDER — OXYBUTYNIN CHLORIDE ER 10 MG PO TB24
10.0000 mg | ORAL_TABLET | Freq: Every day | ORAL | Status: DC
  Administered 2020-01-30: 10 mg via ORAL

## 2020-01-30 SURGICAL SUPPLY — 28 items
BAG DRAIN URO-CYSTO SKYTR STRL (DRAIN) ×2 IMPLANT
BAG DRN RND TRDRP ANRFLXCHMBR (UROLOGICAL SUPPLIES) ×1
BAG DRN UROCATH (DRAIN) ×1
BAG URINE DRAIN 2000ML AR STRL (UROLOGICAL SUPPLIES) ×2 IMPLANT
BAG URINE LEG 500ML (DRAIN) IMPLANT
CATH FOLEY 2WAY SLVR 30CC 20FR (CATHETERS) IMPLANT
CATH FOLEY 3WAY 30CC 22FR (CATHETERS) ×2 IMPLANT
CLOTH BEACON ORANGE TIMEOUT ST (SAFETY) ×2 IMPLANT
ELECT BIVAP BIPO 22/24 DONUT (ELECTROSURGICAL)
ELECT REM PT RETURN 15FT ADLT (MISCELLANEOUS) IMPLANT
ELECTRD BIVAP BIPO 22/24 DONUT (ELECTROSURGICAL) IMPLANT
GLOVE BIO SURGEON STRL SZ 6.5 (GLOVE) ×4 IMPLANT
GLOVE BIO SURGEON STRL SZ7 (GLOVE) ×2 IMPLANT
GOWN STRL REUS W/ TWL LRG LVL3 (GOWN DISPOSABLE) ×2 IMPLANT
GOWN STRL REUS W/TWL LRG LVL3 (GOWN DISPOSABLE) ×4
HOLDER FOLEY CATH W/STRAP (MISCELLANEOUS) ×2 IMPLANT
IV NS IRRIG 3000ML ARTHROMATIC (IV SOLUTION) ×14 IMPLANT
KIT TURNOVER CYSTO (KITS) ×2 IMPLANT
LOOP CUT BIPOLAR 24F LRG (ELECTROSURGICAL) ×2 IMPLANT
MANIFOLD NEPTUNE II (INSTRUMENTS) ×2 IMPLANT
NS IRRIG 500ML POUR BTL (IV SOLUTION) ×2 IMPLANT
PACK CYSTO (CUSTOM PROCEDURE TRAY) ×2 IMPLANT
SYR 30ML LL (SYRINGE) ×4 IMPLANT
SYR TOOMEY IRRIG 70ML (MISCELLANEOUS) ×2
SYRINGE TOOMEY IRRIG 70ML (MISCELLANEOUS) ×1 IMPLANT
TUBE CONNECTING 12X1/4 (SUCTIONS) ×2 IMPLANT
TUBING UROLOGY SET (TUBING) ×2 IMPLANT
WATER STERILE IRR 500ML POUR (IV SOLUTION) ×2 IMPLANT

## 2020-01-30 NOTE — Op Note (Signed)
Operative Note  Preoperative diagnosis:  1.  BPH with bladder outlet obstruction  Postoperative diagnosis: 1.  BPH with bladder outlet obstruction  Procedure(s): 1.  Bipolar transurethral resection of prostate  Surgeon: Rexene Alberts, MD  Assistants:  None  Anesthesia:  General  Complications:  None  EBL:  58ml  Specimens: 1. Prostate chips ID Type Source Tests Collected by Time Destination  1 : prostate chips Tissue PATH Prostate TURP SURGICAL PATHOLOGY Janith Lima, MD 01/30/2020 1421    Drains/Catheters: 1.  22Fr 3 way catheter with 79ml water into balloon  Intraoperative findings:   1. Trilobar obstructing prostate with very large median lobe.  Resected down to the level of capsule.  Excellent hemostasis.  Will need follow-up as an outpatient  Indication:  Gregory Friedman is a 70 y.o. male with BPH with bladder outlet obstruction presenting for transurethral resection of the prostate. He had a TRUS volume of 59g. Uroflow demonstrated average of 1.62ml/sec. Preop cystoscopy demonstrated trilobar obstructing prostate. He has failed medical therapy.  After thorough discussion including all relevant risk benefits and alternatives, he presents today for a bipolar TURP.  Description of procedure: The indication, alternatives, benefits and risks were discussed with the patient and informed consent was obtained.  Patient was brought to the operating room table, positioned supine, secured with a safety strap.  Pneumatic compression devices were placed on the lower extremities.  After the administration of intravenous antibiotics and general anesthesia, the patient was repositioned into the dorsal lithotomy position.  All pressure points were carefully padded.  A rectal examination was performed confirming a smooth symmetric enlarged gland.  The genitalia were prepped and draped in standard sterile manner.  A timeout was completed, verifying the correct patient, surgical procedure and  positioning prior to beginning the procedure.  Isotonic sodium chloride was used for irrigation.  A 26 French continuous-flow resectoscope sheath with the visual obturator and a 30 degree lens was advanced under direct vision into the bladder.  The anterior urethra appeared normal in its entirety. The prostatic urethra was elongated with trilobar hyperplasia with an elevated bladder neck.  On cystosc his opic evaluation, his bladder capacity appeared normal, the bladder wall was noted to expand symmetrically in all dimensions.  There were no tumors, stones or foreign bodies present. The bladder was  trabeculated with normal-appearing mucosa.  Both ureteral orifices were in their normal anatomic positions with clear urinary reflux noted bilaterally.  The obturator was removed and replaced by the working element with a resection loop.  The location of the ureteral orifices and the prostatic configuration were again confirmed.  Starting at the bladder neck and proceeding distally to the verumontanum a transurethral section of the prostate was performed using bipolar using energy of 4 and 5 for cutting and coagulation, respectively.  The procedure began at the bladder neck at the 5 o'clock and 7 o'clock positions and carefully carried distally to the verumontanum, resecting the intervening prostatic adenoma.  Next the left lateral lobe was resected to the level of the transverse capsular fibers.  The identical procedure was performed on the right lobe.  Attention was then directed anteriorly and the resection was completed from the 10 o'clock to 2 o'clock positions.  All bleeding vessels were fulgurated achieving meticulous hemostasis.  The bladder was irrigated with a Toomey syringe, ensuring removal of all prostate chips which were sent to pathology for evaluation.  Having completed the resection and the chips removed, we again confirmed hemostasis with the loop  with coagulating current.  Upon completion of  the entire procedure, the bladder and posterior urethra were reexamined, confirming open prostatic urethra and bladder neck without evidence of bleeding or perforation.  Both ureteral orifices and the external sphincter were noted to be intact.  The resectoscope was withdrawn under direct vision and a 22 French three-way Foley catheter with a 30 cc balloon was inserted into the bladder.  The balloon was inflated with 50 cc of sterile water.  After multiple manual irrigations ensuring clear return of the irrigant, the procedure was terminated.  The catheter was attached to a drainage bag and continuous bladder irrigation was started with normal saline.  The patient was positioned supine.  At the end of the procedure, all counts were correct.  Patient tolerated the procedure well and was taken to the recovery room satisfactory condition.  Plan: Continuous bladder irrigation overnight.  Plan to discharge home tomorrow with Foley catheter in place and void trial in the office in 3 days.  [facility] R[facility]  Pager: [phone]

## 2020-01-30 NOTE — Progress Notes (Signed)

## 2020-01-30 NOTE — H&P (Signed)
Office Visit Report 12/16/2019    Dmarcus Decicco         MRN: [id number] PRIMARY CARE:  [name], MD   REFERRING:  Janith Lima, MD  DOB: 16-Dec-1950, 70 year old Male PROVIDER:  Rexene Alberts, M.D.  SSN:  LOCATION:  [facility] [phone]    CC/HPI: Gregory Friedman is a 70 year old male seen in followup today for BPH with LUTS.   TRUS 12/16/2018 with 59 g prostate. Cystoscopy 12/16/2019 with trilobar hypertrophy with ball-valve obstructing median lobe, 4+ bladder trabeculation. Uroflow 12/16/2019 with 1.5 mL/sec. PVR 0 mL.   He states that over the past decade or so he has had lower urinary tract symptoms that he is controlled with over-the-counter saw palmetto. He states the symptoms have progressively worsened. He reports 1 time nocturia. He reports a low flow stream. He reports a sensation likely bladder emptying. He denies dysuria or hematuria. He states he tried Flomax in the past however had bothersome side effects with his reportedly pain with ejaculation. He is however interested in discussing noninvasive surgical therapy.   Patient currently denies fever, chills, sweats, nausea, vomiting, abdominal or flank pain, gross hematuria or dysuria.   He does have a history of a CABG. Will obtain cardiac clearance prior to surgery.       ALLERGIES: No Allergies    MEDICATIONS: Aspirin  Metformin Hcl 500 mg tablet  Metoprolol Succinate 50 mg tablet, extended release 24 hr  Omeprazole 40 mg capsule,delayed release  Simvastatin 40 mg tablet  Amlodipine Besilate  Enalapril Maleate 10 mg tablet     GU PSH: None     PSH Notes: Double By Pass SCS left cheek   NON-GU PSH: None         GU PMH: BPH w/LUTS - 11/10/2019 Encounter for Prostate Cancer screening (Stable) - 11/10/2019, Visit For: Screening Exam Malignant Neoplasm Prostate, - 2014 Incomplete bladder emptying - 11/10/2019 Nocturia - 11/10/2019 Weak Urinary Stream - 11/10/2019 Prostate nodule w/o  LUTS, The nodule that was previously noted is not palpable today. I found his prostate was smooth and benign. We discussed what may have been felt on his previous exam but at this time with no nodularity my recommendation is continued yearly DRE and PSA only. - 2019 Dysuria, Dysuria - 2014 Other microscopic hematuria, Microscopic hematuria - 2014       PMH Notes:  1898-01-23 00:00:00 - Note: Normal Routine History And Physical Adult  2010-10-07 14:21:30 - Note: Coronary Arteriosclerosis    NON-GU PMH: Hypertension, Benign Essential Hypertension - 2014 Personal history of other endocrine, nutritional and metabolic disease, History of hyperlipidemia - 2014, History of diabetes mellitus, - 2014 Personal history of other specified conditions, History of dizziness - 2014 Arthritis Diabetes Type 2 GERD Heart disease, unspecified Hypercholesterolemia Other heart failure Skin Cancer, History     FAMILY HISTORY: None    SOCIAL HISTORY: Marital Status: Married Preferred Language: English Current Smoking Status: Patient has never smoked.  <DIV'  Tobacco Use Assessment Completed:  Used Tobacco in last 30 days?   Has never drank.  Drinks 4+ caffeinated drinks per day. Patient's occupation is/was retired.     REVIEW OF SYSTEMS:     GU Review Male:  Patient denies frequent urination, hard to postpone urination, burning/ pain with urination, get up at night to urinate, leakage of urine, stream starts and stops, trouble starting your stream, have to strain to urinate , erection problems, and penile pain.  Gastrointestinal (Upper):  Patient denies nausea, vomiting, and indigestion/ heartburn.    Gastrointestinal (Lower):  Patient denies diarrhea and constipation.    Constitutional:  Patient denies fever, night sweats, weight loss, and fatigue.    Skin:  Patient denies skin rash/ lesion and itching.    Eyes:  Patient denies blurred vision and double vision.    Ears/ Nose/ Throat:  Patient denies  sore throat and sinus problems.    Hematologic/Lymphatic:  Patient denies swollen glands and easy bruising.    Cardiovascular:  Patient denies leg swelling and chest pains.    Respiratory:  Patient denies cough and shortness of breath.    Endocrine:  Patient denies excessive thirst.    Musculoskeletal:  Patient denies back pain and joint pain.    Neurological:  Patient denies headaches and dizziness.    Psychologic:  Patient denies depression and anxiety.    VITAL SIGNS:       12/16/2019 04:13 PM     Weight 185 lb / 83.91 kg     Height 68 in / 172.72 cm     BP 141/81 mmHg     Pulse 58 /min     Temperature 97.8 F / 36.5 C     BMI 28.1 kg/m     MULTI-SYSTEM PHYSICAL EXAMINATION:      Constitutional: Well-nourished. No physical deformities. Normally developed. Good grooming.     Respiratory: No labored breathing, no use of accessory muscles.      Cardiovascular: Normal temperature, normal extremity pulses, no swelling, no varicosities.     Gastrointestinal: No mass, no tenderness, no rigidity, non obese abdomen.            Complexity of Data:   Source Of History:  Patient, Medical Record Summary  Lab Test Review:  PSA  Records Review:  AUA Symptom Score, Previous Doctor Records, Previous Patient Records, IIEF Score  Urine Test Review:  Urinalysis  Urodynamics Review:  Review Bladder Scan, Review Flow Rate  X-Ray Review: Prostate Ultrasound: Reviewed Films. Reviewed Report.      11/10/19 07/28/15 03/25/13 08/22/10  PSA  Total PSA 0.66 ng/mL 0.81 ng/dl 1.20 ng/dl 0.58 ng/dl    PROCEDURES:    Flexible Cystoscopy - 52000  Risks, benefits, and some of the potential complications of the procedure were discussed at length with the patient including infection, bleeding, voiding discomfort, urinary retention, fever, chills, sepsis, and others. All questions were answered. Informed consent was obtained. Antibiotic prophylaxis was given. Sterile technique and intraurethral analgesia were  used.  Meatus:  Normal size. Normal location. Normal condition.  Urethra:  No strictures.  External Sphincter:  Normal.  Verumontanum:  Normal.  Prostate:  Trilobar obstruction with obstructing ball-valve median lobe.  Bladder Neck:  Obstructing ball valve median lobe  Ureteral Orifices:  Normal location. Normal size. Normal shape. Effluxed clear urine.  Bladder:  4+ trabeculation. No tumors. Normal mucosa. No stones.      The lower urinary tract was carefully examined. The procedure was well-tolerated and without complications. Antibiotic instructions were given. Instructions were given to call the office immediately for bloody urine, difficulty urinating, urinary retention, painful or frequent urination, fever, chills, nausea, vomiting or other illness. The patient stated that he understood these instructions and would comply with them.     Prostate Ultrasound WF:[id number]  Length:4.86 Height:4.43 Width:5.26 Volume:59.39ml  Calcifications noted within.   Patient confirmed No Neulasta OnPro Device.        The transrectal ultrasound probe is introduced into  the rectum, and the prostate is visualized. Ultrasonography is utilized throughout the procedure. At the conclusion of the procedure, the ultrasound probe is removed. The patient tolerates the procedure without complication.    Flow Rate - 51741  Voided Volume: 244 cc  Flow Time: 2:40 min:sec  Time of Peak Flow: 1:02 min:sec  Peak Flow Rate: 3.6 cc/sec  Average Flow Rate: 1.5 cc/sec  Total Void Time: 4:24 min:sec      PVR Ultrasound - [id number]  Scanned Volume: 0 cc    Urinalysis - 81003  Dipstick Dipstick Cont'd  Color: Yellow Bilirubin: Neg  Appearance: Clear Ketones: Neg  Specific Gravity: 1.020 Blood: Neg  pH: 6.0 Protein: Trace  Glucose: Neg Urobilinogen: 0.2   Nitrites: Neg   Leukocyte Esterase: Neg   Notes:       ASSESSMENT:     ICD-10 Details  1 GU:  BPH w/LUTS - N40.1   2  Encounter for Prostate  Cancer screening - Z12.5   3  Incomplete bladder emptying - R39.14   4  Weak Urinary Stream - R39.12    PLAN:   Medications  New Meds: Tamsulosin Hcl 0.4 mg capsule 1 capsule PO Q HS #30 11 Refill(s)    Document  Letter(s):  Created for Patient: Clinical Summary   Notes:  1. BPH: TRUS 12/16/2018 with 59 g prostate. Cystoscopy 12/16/2019 with trilobar hypertrophy with ball-valve obstructing median lobe, 4+ bladder trabeculation. Uroflow 12/16/2019 with 1.5 mL/sec. PVR 0 mL. Start tamsulosin 0.4 mg. Interested in surgical therapy. Surgery letter sent for TURP. Discussed all risks and benefits.   Risks and benefits of TURP were reviewed in detail including infection, bleeding, blood transfusion, injury to bladder/urethra/surrounding structures, erectile dysfunction, urinary incontinence, bladder neck contracture, persistent obstructive and irritative voiding symptoms, and global anesthesia risks including but not limited to CVA, MI, DVT, PE, pneumonia, and death. He expressed understanding and desire to proceed.    2. Prostate cancer screening: DRE benign. PSA 0.66 NG/mL on 11/10/2019.   CC: [name], MD    Signed by Rexene Alberts, M.D. on 12/17/19 at 10:17 AM (EST)  Urology Preoperative H&P   Chief Complaint: BPH  History of Present Illness: Gregory Friedman is a 70 y.o. male with BPH here for TURP. Denies fevers or chills.    Past Medical History:  Diagnosis Date  . Anxiety   . Arthritis   . Benign localized prostatic hyperplasia with lower urinary tract symptoms (LUTS)   . Bradycardia    chronic, asymptomatic per cardiology note  . 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 CKD (chronic kidney disease),  stage III ([place])   . Coronary artery disease cardiologist--- dr Mamie Nick. [facility]   remote hx MI;  s/p  PCI and stenting in 2003 x2 to LAD ;  s/p CABG x2 in 2004 (LIMA --LAD,  SVG--OM);  s/p  PCI stenting x2 to LAD/ OM;  cardiac cath 05-25-2010;  stress test 07-15-2015 no ischemia, ef 45-50%, intermediate risk  . Decreased range of motion of neck    s/p  radical neck dissection for cancer in 2014  . Depression   . Diverticulosis   . GERD (gastroesophageal reflux disease)   . History of  colon polyps   . History of concussion 01/2018   per pt had vertigo fell and hit head, no loc, no residual  . History of esophageal stricture 02/16/2011   s/p  dilatation  . History of gout    toe  . History of MI (myocardial infarction) 2003   s/p  PCI and stenting  . 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 Hypertension    followed by cardiology and pcp  . Hypothyroidism, unspecified    followed by pcp---  per pt never been on medication  . Limitation of opening of mouth    s/p  radical neck dissection due to cancer in 2014,  (01-28-2020  pt can get 2 finger's, but not 3)  . 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 Mixed hyperlipidemia   . PONV (postoperative nausea and vomiting)   . Psoriasis   . S/P CABG x 2 2004  . S/P drug eluting coronary stent placement    2003  x2 to LAD;  2006 post cabg , des x2 to LAD/ OM  . S/P radiation therapy    Received 8 fractions, then stopped due to mucositis with Radiation/ Cisplatin  . Type 2 diabetes mellitus treated with insulin ([place])    followed by pcp----  (01-28-2020 per pt checks blood sugar daily in am,  fasting sugar--- 100-130)  . Vertigo, intermittent     Past Surgical History:  Procedure Laterality Date  . CARDIAC CATHETERIZATION  05-25-2010  dr Lizbeth Bark   LIMA-- LAD at[place],  SVG--OM occluded, multiple areas which could be cause chest pain or ischemia (severe disease distal  LCx or moderate disease Diagonal)  . COLONOSCOPY WITH PROPOFOL  last one 05-30-2017  dr Carlean Purl  . CORONARY ANGIOPLASTY WITH STENT PLACEMENT  2003   PCI and stent x2 to LAD  . CORONARY ANGIOPLASTY WITH STENT PLACEMENT  10/ 2006  dr Lizbeth Bark   PCI and DES x2  to LAD and OM  . CORONARY ARTERY BYPASS GRAFT  2004  in [place]   LIMA --- LAD,  SVG -- OM  . CYSTOSCOPY  02/09/2011   Procedure: CYSTOSCOPY;  Surgeon: Marissa Nestle, MD;  Location: [facility];  Service: Urology;  Laterality: N/A;  . ESOPHAGOGASTRODUODENOSCOPY  02-16-2011  dr Carlean Purl  . ESOPHAGOGASTRODUODENOSCOPY (EGD) WITH ESOPHAGEAL DILATION  02/16/2011   with Propofol  . FOOT ARTHRODESIS, SUBTALAR Right 10/13/2009  . MASS EXCISION N/A 03/13/2012   Procedure: EXCISION LEFT BUCCAL MUCOSA;  Surgeon: Rozetta Nunnery, MD;  Location: [facility];  Service: ENT;  Laterality: N/A;  . RADICAL NECK DISSECTION Left 03/13/2012   Procedure: RADICAL NECK DISSECTION;  Surgeon: Rozetta Nunnery, MD;  Location: [facility];  Service: ENT;  Laterality: Left;  . TOOTH EXTRACTION N/A 03/13/2012   Procedure: EXTRACTION MOLARS;  Surgeon: Rozetta Nunnery, MD;  Location: [facility];  Service: ENT;  Laterality: N/A;  . TRIGGER FINGER RELEASE Right 02/24/2016   Procedure: RELEASE TRIGGER FINGER/A-1 PULLEY RIGHT THUMB;  Surgeon: Daryll Brod, MD;  Location: [place] [place];  Service: Orthopedics;  Laterality: Right;  FAB    Allergies: No Known Allergies  Family History  Problem Relation Age of Onset  . Stroke Sister   . Diabetes Brother   . Seizures Mother   . Asthma Father   . Thyroid cancer Daughter   . Diabetes Sister   . Parkinson's  disease Brother   . Heart disease Brother   . Diabetes Brother   . Colon cancer Neg Hx     Social History:  reports that he has never smoked. He has never used smokeless tobacco. He reports that he does not drink alcohol and does not use drugs.  ROS: A complete review of systems was performed.  All systems  are negative except for pertinent findings as noted.  Physical Exam:  Vital signs in last 24 hours:   Constitutional:  Alert and oriented, No acute distress Cardiovascular: Regular rate and rhythm Respiratory: Normal respiratory effort, Lungs clear bilaterally GI: Abdomen is soft, nontender, nondistended, no abdominal masses GU: No CVA tenderness Lymphatic: No lymphadenopathy Neurologic: Grossly intact, no focal deficits Psychiatric: Normal mood and affect  Laboratory Data:  No results for input(s): WBC, HGB, HCT, PLT in the last 72 hours.  No results for input(s): NA, K, CL, GLUCOSE, BUN, CALCIUM, CREATININE in the last 72 hours.  Invalid input(s): CO3   No results found for this or any previous visit (from the past 24 hour(s)). Recent Results (from the past 240 hour(s))  SARS CORONAVIRUS 2 (TAT 6-24 HRS) Nasopharyngeal Nasopharyngeal Swab     Status: None   Collection Time: 01/27/20  2:23 PM   Specimen: Nasopharyngeal Swab  Result Value Ref Range Status   SARS Coronavirus 2 NEGATIVE NEGATIVE Final    Comment: (NOTE) SARS-CoV-2 target nucleic acids are NOT DETECTED.  The SARS-CoV-2 RNA is generally detectable in upper and lower respiratory specimens during the acute phase of infection. Negative results do not preclude SARS-CoV-2 infection, do not rule out co-infections with other pathogens, and should not be used as the sole basis for treatment or other patient management decisions. Negative results must be combined with clinical observations, patient history, and epidemiological information. The expected result is Negative.  Fact Sheet for Patients: SugarRoll.be  Fact Sheet for Healthcare Providers: https://www.woods-mathews.com/  This test is not yet approved or cleared by the [place] FDA and  has been authorized for detection and/or diagnosis of SARS-CoV-2 by FDA under an Emergency Use Authorization (EUA). This EUA  will remain  in effect (meaning this test can be used) for the duration of the COVID-19 declaration under Se ction 564(b)(1) of the Act, 21 U.S.C. section 360bbb-3(b)(1), unless the authorization is terminated or revoked sooner.  Performed at [facility], [place] [address], [place], [place] [address]     Renal Function: No results for input(s): CREATININE in the last 168 hours. CrCl cannot be calculated (Patient's most recent lab result is older than the maximum 21 days allowed.).  Radiologic Imaging: No results found.  I independently reviewed the above imaging studies.  Assessment and Plan Christerpher Clos is a 70 y.o. male with BPH here for TURP.   Risks and benefits of TURP were reviewed in detail including infection, bleeding, blood transfusion, injury to bladder/urethra/surrounding structures, erectile dysfunction, urinary incontinence, bladder neck contracture, persistent obstructive and irritative voiding symptoms, and global anesthesia risks including but not limited to CVA, MI, DVT, PE, pneumonia, and death. He expressed understanding and desire to proceed.   Matt R. Evianna Chandran MD 01/30/2020, 11:30 AM  [facility] Pager: [phone]): [phone]

## 2020-01-30 NOTE — Anesthesia Procedure Notes (Signed)
Procedure Name: LMA Insertion Date/Time: 01/30/2020 1:28 PM Performed by: Hewitt Blade, CRNA Pre-anesthesia Checklist: Patient identified, Emergency Drugs available, Suction available and Patient being monitored Patient Re-evaluated:Patient Re-evaluated prior to induction Oxygen Delivery Method: Circle system utilized Preoxygenation: Pre-oxygenation with 100% oxygen Induction Type: IV induction Ventilation: Mask ventilation without difficulty LMA: LMA inserted LMA Size: 4.0 Number of attempts: 1 Placement Confirmation: positive ETCO2 and breath sounds checked- equal and bilateral Tube secured with: Tape Dental Injury: Teeth and Oropharynx as per pre-operative assessment

## 2020-01-30 NOTE — Transfer of Care (Signed)
Immediate Anesthesia Transfer of Care Note  Patient: Gregory Friedman  Procedure(s) Performed: TRANSURETHRAL RESECTION OF THE PROSTATE (TURP) (N/A Prostate)  Patient Location: PACU  Anesthesia Type:General  Level of Consciousness: awake  Airway & Oxygen Therapy: Patient Spontanous Breathing and Patient connected to nasal cannula oxygen  Post-op Assessment: Report given to RN and Post -op Vital signs reviewed and stable  Post vital signs: Reviewed and stable  Last Vitals:  Vitals Value Taken Time  BP 124/72 01/30/20 1445  Temp    Pulse 80 01/30/20 1449  Resp 14 01/30/20 1449  SpO2 98 % 01/30/20 1449  Vitals shown include unvalidated device data.  Last Pain:  Vitals:   01/30/20 1134  TempSrc: Oral  PainSc: 0-No pain      Patients Stated Pain Goal: 5 (70/78/67 5449)  Complications: No complications documented.

## 2020-01-30 NOTE — Anesthesia Postprocedure Evaluation (Signed)
Anesthesia Post Note  Patient: Gregory Friedman Age  Procedure(s) Performed: TRANSURETHRAL RESECTION OF THE PROSTATE (TURP) (N/A Prostate)     Patient location during evaluation: PACU Anesthesia Type: General Level of consciousness: awake and alert Pain management: pain level controlled Vital Signs Assessment: post-procedure vital signs reviewed and stable Respiratory status: spontaneous breathing, nonlabored ventilation, respiratory function stable and patient connected to nasal cannula oxygen Cardiovascular status: blood pressure returned to baseline and stable Postop Assessment: no apparent nausea or vomiting Anesthetic complications: no   No complications documented.  Last Vitals:  Vitals:   01/30/20 1445 01/30/20 1500  BP: 124/72 129/63  Pulse: 80 75  Resp: 13 15  Temp: 36.5 C   SpO2: 96% 98%    Last Pain:  Vitals:   01/30/20 1500  TempSrc:   PainSc: Asleep                 Catalina Gravel

## 2020-01-30 NOTE — Anesthesia Preprocedure Evaluation (Addendum)
Anesthesia Evaluation  Patient identified by MRN, date of birth, ID band Patient awake    Reviewed: Allergy & Precautions, NPO status , Patient's Chart, lab work & pertinent test results  History of Anesthesia Complications (+) PONV and history of anesthetic complications  Airway Mallampati: II  TM Distance: >3 FB Neck ROM: Full    Dental  (+) Dental Advisory Given, Caps   Pulmonary neg pulmonary ROS,    Pulmonary exam normal        Cardiovascular hypertension, Pt. on medications and Pt. on home beta blockers + CAD, + Past MI, + Cardiac Stents, + CABG and +CHF  Normal cardiovascular exam   '17 TTE - EF 45% to 50%. There is akinesis of the mid-apicalanteroseptal myocardium. Grade 1 diastolic dysfunction. Trace MR    Neuro/Psych PSYCHIATRIC DISORDERS Anxiety Depression  Vertigo     GI/Hepatic Neg liver ROS, GERD  Medicated and Controlled,  Endo/Other  diabetes, Type 2, Insulin DependentHypothyroidism   Renal/GU CRFRenal disease     Musculoskeletal  (+) Arthritis ,  Gout    Abdominal   Peds  Hematology negative hematology ROS (+)   Anesthesia Other Findings Covid test negative Hx radical neck dissection, radiation   Reproductive/Obstetrics                            Anesthesia Physical Anesthesia Plan  ASA: III  Anesthesia Plan: General   Post-op Pain Management:    Induction: Intravenous  PONV Risk Score and Plan: 3 and Treatment may vary due to age or medical condition, Ondansetron and Dexamethasone  Airway Management Planned: LMA  Additional Equipment: None  Intra-op Plan:   Post-operative Plan: Extubation in OR  Informed Consent: I have reviewed the patients History and Physical, chart, labs and discussed the procedure including the risks, benefits and alternatives for the proposed anesthesia with the patient or authorized representative who has indicated his/her  understanding and acceptance.     Dental advisory given  Plan Discussed with: CRNA and Anesthesiologist  Anesthesia Plan Comments:        Anesthesia Quick Evaluation

## 2020-01-31 DIAGNOSIS — N138 Other obstructive and reflux uropathy: Secondary | ICD-10-CM | POA: Diagnosis not present

## 2020-01-31 DIAGNOSIS — N401 Enlarged prostate with lower urinary tract symptoms: Secondary | ICD-10-CM | POA: Diagnosis not present

## 2020-01-31 DIAGNOSIS — E1122 Type 2 diabetes mellitus with diabetic chronic kidney disease: Secondary | ICD-10-CM | POA: Diagnosis not present

## 2020-01-31 DIAGNOSIS — I129 Hypertensive chronic kidney disease with stage 1 through stage 4 chronic kidney disease, or unspecified chronic kidney disease: Secondary | ICD-10-CM | POA: Diagnosis not present

## 2020-01-31 DIAGNOSIS — Z951 Presence of aortocoronary bypass graft: Secondary | ICD-10-CM | POA: Diagnosis not present

## 2020-01-31 DIAGNOSIS — N183 Chronic kidney disease, stage 3 unspecified: Secondary | ICD-10-CM | POA: Diagnosis not present

## 2020-01-31 DIAGNOSIS — C61 Malignant neoplasm of prostate: Secondary | ICD-10-CM | POA: Diagnosis not present

## 2020-01-31 LAB — BASIC METABOLIC PANEL
Anion gap: 7 (ref 5–15)
BUN: 16 mg/dL (ref 8–23)
CO2: 28 mmol/L (ref 22–32)
Calcium: 8.7 mg/dL — ABNORMAL LOW (ref 8.9–10.3)
Chloride: 102 mmol/L (ref 98–111)
Creatinine, Ser: 1.58 mg/dL — ABNORMAL HIGH (ref 0.61–1.24)
GFR, Estimated: 47 mL/min — ABNORMAL LOW (ref >60)
Glucose, Bld: 140 mg/dL — ABNORMAL HIGH (ref 70–99)
Potassium: 3.5 mmol/L (ref 3.5–5.1)
Sodium: 137 mmol/L (ref 135–145)

## 2020-01-31 LAB — CBC
HCT: 36.5 % — ABNORMAL LOW (ref 39.0–52.0)
Hemoglobin: 11.9 g/dL — ABNORMAL LOW (ref 13.0–17.0)
MCH: 28.3 pg (ref 26.0–34.0)
MCHC: 32.6 g/dL (ref 30.0–36.0)
MCV: 86.9 fL (ref 80.0–100.0)
Platelets: 130 10*3/uL — ABNORMAL LOW (ref 150–400)
RBC: 4.2 MIL/uL — ABNORMAL LOW (ref 4.22–5.81)
RDW: 12.7 % (ref 11.5–15.5)
WBC: 6.3 10*3/uL (ref 4.0–10.5)
nRBC: 0 % (ref 0.0–0.2)

## 2020-01-31 LAB — GLUCOSE, CAPILLARY: Glucose-Capillary: 112 mg/dL — ABNORMAL HIGH (ref 70–99)

## 2020-01-31 MED ORDER — OXYCODONE-ACETAMINOPHEN 5-325 MG PO TABS
ORAL_TABLET | ORAL | Status: AC
  Filled 2020-01-31: qty 1

## 2020-01-31 MED ORDER — HEPARIN SODIUM (PORCINE) 5000 UNIT/ML IJ SOLN
INTRAMUSCULAR | Status: AC
  Filled 2020-01-31: qty 1

## 2020-01-31 NOTE — Discharge Summary (Signed)
Physician Discharge Summary  Patient ID: Gregory Friedman MRN: [id number] DOB/AGE: 71-Oct-1952 71 y.o.  Admit date: 01/30/2020 Discharge date: 01/31/2020  Admission Diagnoses:  BPH Discharge Diagnoses:  Active Problems:   BPH (benign prostatic hyperplasia)   Past Medical History:  Diagnosis Date  . Anxiety   . Arthritis   . Benign localized prostatic hyperplasia with lower urinary tract symptoms (LUTS)   . Bradycardia    chronic, asymptomatic per cardiology note  . 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 CKD (chronic kidney disease), stage III ([place])   . Coronary artery disease cardiologist--- dr Mamie Nick. [facility]   remote hx MI;  s/p  PCI and stenting in 2003 x2 to LAD ;  s/p CABG x2 in 2004 (LIMA --LAD,  SVG--OM);  s/p  PCI stenting x2 to LAD/ OM;  cardiac cath 05-25-2010;  stress test 07-15-2015 no ischemia, ef 45-50%, intermediate risk  . Decreased range of motion of neck    s/p  radical neck dissection for cancer in 2014  . Depression   . Diverticulosis   . GERD (gastroesophageal reflux disease)   . History of colon polyps   . History of concussion 01/2018   per pt had vertigo fell and hit head, no loc, no residual  . History of esophageal stricture 02/16/2011   s/p  dilatation  . History of gout    toe  . History of MI (myocardial infarction) 2003   s/p  PCI and stenting  . 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 Hypertension    followed by cardiology and pcp  . Hypothyroidism, unspecified    followed by pcp---  per pt never been on medication  . Limitation of opening of mouth    s/p  radical neck dissection due to cancer in 2014,  (01-28-2020  pt can get 2 finger's, but not 3)  .  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 Mixed hyperlipidemia   . PONV (postoperative nausea and vomiting)   . Psoriasis   . S/P CABG x 2 2004  . S/P drug eluting coronary stent placement    2003  x2 to LAD;  2006 post cabg , des x2 to LAD/ OM  . S/P radiation therapy    Received 8 fractions, then stopped due to mucositis with Radiation/ Cisplatin  . Type 2 diabetes mellitus treated with insulin ([place])    followed by pcp----  (01-28-2020 per pt checks blood sugar daily in am,  fasting sugar--- 100-130)  . Vertigo, intermittent     Surgeries: Procedure(s): TRANSURETHRAL RESECTION OF THE PROSTATE (TURP) on 01/30/2020   Consultants (if any):   Discharged Condition: Improved  Hospital Course: [name] is an 70 y.o. male who was admitted 01/30/2020 with a diagnosis of BPH and went to the operating room on 01/30/2020 and underwent the above named procedures.    He was given perioperative antibiotics:  Anti-infectives (From admission, onward)   Start     Dose/Rate Route Frequency Ordered Stop   01/30/20 1145  ceFAZolin (ANCEF) IVPB 2g/100 mL premix        2 g  200 mL/hr over 30 Minutes Intravenous  Once 01/30/20 1132 01/30/20 1323   01/30/20 0000  cephALEXin (KEFLEX) 500 MG capsule        500 mg Oral 4 times daily 01/30/20 1134 02/01/20 2359    .  He was given sequential compression devices, early ambulation for DVT prophylaxis.  He benefited maximally from the hospital stay and there were no complications.    Recent vital signs:  Vitals:   01/31/20 0637 01/31/20 0840  BP: 120/64 120/72  Pulse: (!) 56 (!) 55  Resp: 14 16  Temp: 98.7 F (37.1 C) 98.9 F (37.2 C)  SpO2: 97% 95%    Recent laboratory studies:  Lab Results  Component Value Date   HGB 11.9 (L) 01/31/2020   HGB 14.6 01/30/2020   HGB 13.6 01/01/2020   Lab Results  Component Value Date   WBC 6.3 01/31/2020   PLT 130 (L) 01/31/2020   Lab Results   Component Value Date   INR 1.00 04/10/2012   Lab Results  Component Value Date   NA 137 01/31/2020   K 3.5 01/31/2020   CL 102 01/31/2020   CO2 28 01/31/2020   BUN 16 01/31/2020   CREATININE 1.58 (H) 01/31/2020   GLUCOSE 140 (H) 01/31/2020    Discharge Medications:   Allergies as of 01/31/2020   No Known Allergies     Medication List    TAKE these medications   Accu-Chek Softclix Lancets lancets USE AS DIRECTED   aspirin EC 81 MG tablet Take 81 mg by mouth at bedtime.   cephALEXin 500 MG capsule Commonly known as: KEFLEX Take 1 capsule (500 mg total) by mouth 4 (four) times daily for 6 doses.   Coenzyme Q10 200 MG capsule Take 200 mg by mouth at bedtime.   docusate sodium 100 MG capsule Commonly known as: Colace Take 1 capsule (100 mg total) by mouth daily as needed for up to 30 doses.   enalapril 10 MG tablet Commonly known as: VASOTEC TAKE 1 TABLET EVERY DAY What changed: when to take this   glucose blood test strip Commonly known as: Accu-Chek Guide 1 each by Other route 2 (two) times daily. Use as instructed   Insulin Pen Needle 32G X 4 MM Misc 1 Device by Does not apply route in the morning, at noon, in the evening, and at bedtime.   meclizine 25 MG tablet Commonly known as: ANTIVERT TAKE 1 TABLET THREE TIMES DAILY FOR DIZZINESS What changed: See the new instructions.   melatonin 1 MG Tabs tablet Take 1 mg by mouth at bedtime.   metoprolol succinate 50 MG 24 hr tablet Commonly known as: TOPROL-XL TAKE 1 TABLET EVERY DAY (NEW DIRECTIONS) What changed: See the new instructions.   nitroGLYCERIN 0.4 MG SL tablet Commonly known as: NITROSTAT Place 1 tablet (0.4 mg total) under the tongue every 5 (five) minutes as needed. Chest pain   Omega-3 500 MG Caps Take 1 capsule by mouth daily.   omeprazole 40 MG capsule Commonly known as: PRILOSEC TAKE 1 CAPSULE EVERY DAY BEFORE A MEAL What changed: See the new instructions.   oxyCODONE-acetaminophen  5-325 MG tablet Commonly known as: Percocet Take 1 tablet by mouth every 4 (four) hours as needed for up to 12 doses for severe pain. Notes to patient: Can take next dose at [place] 1 application topically 2 (two) times daily as needed.   simvastatin 40 MG tablet Commonly known as: ZOCOR TAKE 1  TABLET  EVERY EVENING.   tamsulosin 0.4 MG Caps capsule Commonly known as: FLOMAX Take 0.4 mg by mouth at bedtime.   TART CHERRY ADVANCED PO Take by mouth at bedtime.   [name] FlexTouch 100 UNIT/ML FlexTouch Pen Generic drug: insulin degludec Inject 0.4 mLs (40 Units total) into the skin daily. 55 unites per day What changed:   when to take this  additional instructions   triamcinolone 0.1 % Commonly known as: KENALOG Apply 1 application topically 3 (three) times daily. Apply to rash bid x 7-10 days, then prn when rash return       Diagnostic Studies: No results found.  Disposition: Discharge disposition: 01-Home or Self Care       Discharge Instructions    Discharge patient   Complete by: As directed    Discharge disposition: 01-Home or Self Care   Discharge patient date: 01/31/2020       Follow-up Information    [facility] On 02/02/2020.   Why: 8:15AM Contact information: [place]               Signed: Nicolette Bang 01/31/2020, 9:37 AM

## 2020-01-31 NOTE — Discharge Instructions (Signed)
Indwelling Urinary Catheter Care, Adult An indwelling urinary catheter is a thin tube that is put into your bladder. The tube helps to drain pee (urine) out of your body. The tube goes in through your urethra. Your urethra is where pee comes out of your body. Your pee will come out through the catheter, then it will go into a bag (drainage bag). Take good care of your catheter so it will work well. How to wear your catheter and bag Supplies needed Sticky tape (adhesive tape) or a leg strap. Alcohol wipe or soap and water (if you use tape). A clean towel (if you use tape). Large overnight bag. Smaller bag (leg bag). Wearing your catheter Attach your catheter to your leg with tape or a leg strap. Make sure the catheter is not pulled tight. If a leg strap gets wet, take it off and put on a dry strap. If you use tape to hold the bag on your leg: Use an alcohol wipe or soap and water to wash your skin where the tape made it sticky before. Use a clean towel to pat-dry that skin. Use new tape to make the bag stay on your leg. Wearing your bags You should have been given a large overnight bag. You may wear the overnight bag in the day or night. Always have the overnight bag lower than your bladder.  Do not let the bag touch the floor. Before you go to sleep, put a clean plastic bag in a wastebasket. Then hang the overnight bag inside the wastebasket. You should also have a smaller leg bag that fits under your clothes. Always wear the leg bag below your knee. Do not wear your leg bag at night. How to care for your skin and catheter Supplies needed A clean washcloth. Water and mild soap. A clean towel. Caring for your skin and catheter     Clean the skin around your catheter every day: Wash your hands with soap and water. Wet a clean washcloth in warm water and mild soap. Clean the skin around your urethra. If you are male: Gently spread the folds of skin around your vagina  (labia). With the washcloth in your other hand, wipe the inner side of your labia on each side. Wipe from front to back. If you are male: Pull back any skin that covers the end of your penis (foreskin). With the washcloth in your other hand, wipe your penis in small circles. Start wiping at the tip of your penis, then move away from the catheter. Move the foreskin back in place, if needed. With your free hand, hold the catheter close to where it goes into your body. Keep holding the catheter during cleaning so it does not get pulled out. With the washcloth in your other hand, clean the catheter. Only wipe downward on the catheter. Do not wipe upward toward your body. Doing this may push germs into your urethra and cause infection. Use a clean towel to pat-dry the catheter and the skin around it. Make sure to wipe off all soap. Wash your hands with soap and water. Shower every day. Do not take baths. Do not use cream, ointment, or lotion on the area where the catheter goes into your body, unless your doctor tells you to. Do not use powders, sprays, or lotions on your genital area. Check your skin around the catheter every day for signs of infection. Check for: Redness, swelling, or pain. Fluid or blood. Warmth. Pus or a bad smell.  How to empty the bag Supplies needed Rubbing alcohol. Gauze pad or cotton ball. Tape or a leg strap. Emptying the bag Pour the pee out of your bag when it is ?- full, or at least 2-3 times a day. Do this for your overnight bag and your leg bag. Wash your hands with soap and water. Separate (detach) the bag from your leg. Hold the bag over the toilet or a clean pail. Keep the bag lower than your hips and bladder. This is so the pee (urine) does not go back into the tube. Open the pour spout. It is at the bottom of the bag. Empty the pee into the toilet or pail. Do not let the pour spout touch any surface. Put rubbing alcohol on a gauze pad or cotton  ball. Use the gauze pad or cotton ball to clean the pour spout. Close the pour spout. Attach the bag to your leg with tape or a leg strap. Wash your hands with soap and water. Follow instructions for cleaning the drainage bag: From the product maker. As told by your doctor. How to change the bag Supplies needed Alcohol wipes. A clean bag. Tape or a leg strap. Changing the bag Replace your bag when it starts to leak, smell bad, or look dirty. Wash your hands with soap and water. Separate the dirty bag from your leg. Pinch the catheter with your fingers so that pee does not spill out. Separate the catheter tube from the bag tube where these tubes connect (at the connection valve). Do not let the tubes touch any surface. Clean the end of the catheter tube with an alcohol wipe. Use a different alcohol wipe to clean the end of the bag tube. Connect the catheter tube to the tube of the clean bag. Attach the clean bag to your leg with tape or a leg strap. Do not make the bag tight on your leg. Wash your hands with soap and water. General rules  Never pull on your catheter. Never try to take it out. Doing that can hurt you. Always wash your hands before and after you touch your catheter or bag. Use a mild, fragrance-free soap. If you do not have soap and water, use hand sanitizer. Always make sure there are no twists or bends (kinks) in the catheter tube. Always make sure there are no leaks in the catheter or bag. Drink enough fluid to keep your pee pale yellow. Do not take baths, swim, or use a hot tub. If you are male, wipe from front to back after you poop (have a bowel movement). Contact a doctor if: Your pee is cloudy. Your pee smells worse than usual. Your catheter gets clogged. Your catheter leaks. Your bladder feels full. Get help right away if: You have redness, swelling, or pain where the catheter goes into your body. You have fluid, blood, pus, or a bad smell coming from  the area where the catheter goes into your body. Your skin feels warm where the catheter goes into your body. You have a fever. You have pain in your: Belly (abdomen). Legs. Lower back. Bladder. You see blood in the catheter. Your pee is pink or red. You feel sick to your stomach (nauseous). You throw up (vomit). You have chills. Your pee is not draining into the bag. Your catheter gets pulled out. Summary An indwelling urinary catheter is a thin tube that is placed into the bladder to help drain pee (urine) out of the body. The catheter  is placed into the part of the body that drains pee from the bladder (urethra). Taking good care of your catheter will keep it working properly and help prevent problems. Always wash your hands before and after touching your catheter or bag. Never pull on your catheter or try to take it out. This information is not intended to replace advice given to you by your health care provider. Make sure you discuss any questions you have with your health care provider. Document Revised: 05/03/2018 Document Reviewed: 08/25/2016 Elsevier Patient Education  [place]. Transurethral Resection of the Prostate, Care After This sheet gives you information about how to care for yourself after your procedure. Your health care provider may also give you more specific instructions. If you have problems or questions, contact your health care provider. What can I expect after the procedure? After the procedure, it is common to have: Mild pain in your lower abdomen. Soreness or mild discomfort in your penis from having the catheter inserted during the procedure. A feeling of urgency when you need to urinate. A small amount of blood in your urine. You may notice some small blood clots in your urine. These are normal. Follow these instructions at home: Medicines Take over-the-counter and prescription medicines only as told by your health care provider. If you were  prescribed an antibiotic medicine, take it as told by your health care provider. Do not stop taking the antibiotic even if you start to feel better. Ask your health care provider if the medicine prescribed to you: Requires you to avoid driving or using heavy machinery. Can cause constipation. You may need to take actions to prevent or treat constipation, such as: Take over-the-counter or prescription medicines. Eat foods that are high in fiber, such as fresh fruits and vegetables, whole grains, and beans. Limit foods that are high in fat and processed sugars, such as fried or sweet foods. Do not drive for 24 hours if you were given a sedative during your procedure. Activity  Return to your normal activities as told by your health care provider. Ask your health care provider what activities are safe for you. Do not lift anything that is heavier than 10 lb (4.5 kg), or the limit that you are told, for 3 weeks after the procedure or until your health care provider says that it is safe. Avoid intense physical activity for as long as told by your health care provider. Avoid sitting for a long time without moving. Get up and move around one or more times every few hours. This helps to prevent blood clots. You may increase your physical activity gradually as you start to feel better. Lifestyle Do not drink alcohol for as long as told by your health care provider. This is especially important if you are taking prescription pain medicines. Do not engage in sexual activity until your health care provider says that you can do this. General instructions  Do not take baths, swim, or use a hot tub until your health care provider approves. Drink enough fluid to keep your urine pale yellow. Urinate as soon as you feel the need to. Do not try to hold your urine for long periods of time. If your health care provider approves, you may take a stool softener for 2-3 weeks to prevent you from straining to have a  bowel movement. Wear compression stockings as told by your health care provider. These stockings help to prevent blood clots and reduce swelling in your legs. Keep all follow-up  visits as told by your health care provider. This is important. Contact a health care provider if you have: Difficulty urinating. A fever. Pain that gets worse or does not improve with medicine. Blood in your urine that does not go away after 1 week of resting and drinking more fluids. Swelling in your penis or testicles. Get help right away if: You are unable to urinate. You are having more blood clots in your urine instead of fewer. You have: Large blood clots. A lot of blood in your urine. Pain in your back or lower abdomen. Pain or swelling in your legs. Chills and you are shaking. Difficulty breathing or shortness of breath. Summary After the procedure, it is common to have a small amount of blood in your urine. Avoid heavy lifting and intense physical activity for as long as told by your health care provider. Urinate as soon as you feel the need to. Do not try to hold your urine for long periods of time. Keep all follow-up visits as told by your health care provider. This is important. This information is not intended to replace advice given to you by your health care provider. Make sure you discuss any questions you have with your health care provider. Document Revised: 05/01/2018 Document Reviewed: 10/10/2017 Elsevier Patient Education  [place].  Activity:  You are encouraged to ambulate frequently (about every hour during waking hours) to help prevent blood clots from forming in your legs or lungs.  However, you should not engage in any heavy lifting (> 10-15 lbs), strenuous activity, or straining.   Diet: You should advance your diet as instructed by your physician.  It will be normal to have some bloating, nausea, and abdominal discomfort intermittently.   Prescriptions:  You will be  provided a prescription for pain medication to take as needed.  If your pain is not severe enough to require the prescription pain medication, you may take extra strength Tylenol instead which will have less side effects.  You should also take a prescribed stool softener to avoid straining with bowel movements as the prescription pain medication may constipate you.   What to call us about: You should call the office [phone]) if you develop fever > 101 or develop persistent vomiting. Activity:  You are encouraged to ambulate frequently (about every hour during waking hours) to help prevent blood clots from forming in your legs or lungs.  However, you should not engage in any heavy lifting (> 10-15 lbs), strenuous activity, or straining.  You have a foley catheter draining your bladder. This will be removed in followup.

## 2020-02-02 ENCOUNTER — Encounter (HOSPITAL_BASED_OUTPATIENT_CLINIC_OR_DEPARTMENT_OTHER): Payer: Self-pay | Admitting: Urology

## 2020-02-02 LAB — SURGICAL PATHOLOGY

## 2020-02-03 ENCOUNTER — Telehealth: Payer: Self-pay

## 2020-02-03 NOTE — Chronic Care Management (AMB) (Signed)
Chronic Care Management Pharmacy Assistant   Name: Jacobie Stamey  MRN: [id number] DOB: 1950-03-30  Reason for Encounter: Patient Assistance Coordination   PCP : [name], MD   02/03/2020- Patient assistance paperwork for [name] filled out for [employer] patient assistance program. Awaiting patient signature, income documentation and provider signature. Patient discharged from [facility] 01/31/20, Transurethral resection of the Prostate surgery. Will follow up with patient next week to come in and sign paperwork if he is able to.   02/13/2020- Patient called by Raynelle Highland, CPA to come in and sign paperwork. No answer, unable to leave a voice mail.   Allergies:  No Known Allergies  Medications: Outpatient Encounter Medications as of 02/03/2020  Medication Sig Note   Accu-Chek Softclix Lancets lancets USE AS DIRECTED    aspirin EC 81 MG tablet Take 81 mg by mouth at bedtime.     Coenzyme Q10 200 MG capsule Take 200 mg by mouth at bedtime.     Dimethicone-Zinc Oxide (RASH RELIEF EX) Apply 1 application topically 2 (two) times daily as needed.    docusate sodium (COLACE) 100 MG capsule Take 1 capsule (100 mg total) by mouth daily as needed for up to 30 doses.    enalapril (VASOTEC) 10 MG tablet TAKE 1 TABLET EVERY DAY (Patient taking differently: Take 10 mg by mouth at bedtime.)    glucose blood (ACCU-CHEK GUIDE) test strip 1 each by Other route 2 (two) times daily. Use as instructed    insulin degludec (TRESIBA FLEXTOUCH) 100 UNIT/ML FlexTouch Pen Inject 0.4 mLs (40 Units total) into the skin daily. 55 unites per day (Patient taking differently: Inject 40 Units into the skin at bedtime.)    Insulin Pen Needle 32G X 4 MM MISC 1 Device by Does not apply route in the morning, at noon, in the evening, and at bedtime.    Krill Oil (OMEGA-3) 500 MG CAPS Take 1 capsule by mouth daily.    meclizine (ANTIVERT) 25 MG tablet TAKE 1 TABLET THREE TIMES DAILY FOR  DIZZINESS (Patient taking differently: Take 25 mg by mouth 3 (three) times daily as needed.)    melatonin 1 MG TABS tablet Take 1 mg by mouth at bedtime.    metoprolol succinate (TOPROL-XL) 50 MG 24 hr tablet TAKE 1 TABLET EVERY DAY (NEW DIRECTIONS) (Patient taking differently: Take 50 mg by mouth at bedtime.)    Misc Natural Products (TART CHERRY ADVANCED PO) Take by mouth at bedtime.    nitroGLYCERIN (NITROSTAT) 0.4 MG SL tablet Place 1 tablet (0.4 mg total) under the tongue every 5 (five) minutes as needed. Chest pain 01/28/2020: Per pt last taken nitro 01/ 2020   omeprazole (PRILOSEC) 40 MG capsule TAKE 1 CAPSULE EVERY DAY BEFORE A MEAL (Patient taking differently: Take by mouth daily as needed.)    oxyCODONE-acetaminophen (PERCOCET) 5-325 MG tablet Take 1 tablet by mouth every 4 (four) hours as needed for up to 12 doses for severe pain.    simvastatin (ZOCOR) 40 MG tablet TAKE 1 TABLET  EVERY EVENING. (Patient taking differently: Take 40 mg by mouth every evening.)    tamsulosin (FLOMAX) 0.4 MG CAPS capsule Take 0.4 mg by mouth at bedtime.    triamcinolone cream (KENALOG) 0.1 % Apply 1 application topically 3 (three) times daily. Apply to rash bid x 7-10 days, then prn when rash return (Patient not taking: Reported on 01/28/2020)    No facility-administered encounter medications on file as of 02/03/2020.    Current  Diagnosis: Patient Active Problem List   Diagnosis Date Noted   BPH (benign prostatic hyperplasia) 01/30/2020   Low back pain 06/11/2019   Type 2 diabetes mellitus with hyperglycemia, with long-term current use of insulin ([place]) 05/23/2019   Type 2 diabetes mellitus with stage 3a chronic kidney disease, with long-term current use of insulin ([place]) 05/23/2019   Dyslipidemia 05/23/2019   Psoriasis 12/12/2018   Skin lesion 10/31/2018   Diabetes mellitus type 2, insulin dependent ([place]) 09/12/2018   Elevated serum creatinine 09/12/2018   Hypothyroidism 10/10/2017    Atherosclerosis of aorta ([place]) 10/10/2017   Unspecified skin changes 10/10/2017   Other long term (current) drug therapy 10/10/2017   Abdominal pain 09/21/2017   Nausea & vomiting 09/21/2017   History of head and neck cancer 09/21/2017   History of colonic polyps 04/04/2016   Dysphagia 04/04/2016   Bloating 04/04/2016   Diarrhea 04/04/2016   Trigger finger of right thumb 08/23/2015   Chest pain 07/15/2015   Pain in the chest    S/P CABG (coronary artery bypass graft)    Noncompliance with medications 07/08/2015   Acute gout 04/19/2015   GERD (gastroesophageal reflux disease) 05/14/2014   Depression, major, in remission ([place]) 03/27/2013   Allergic rhinitis, cause unspecified 03/27/2013   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

## 2020-02-04 ENCOUNTER — Telehealth: Payer: Self-pay

## 2020-02-04 NOTE — Chronic Care Management (AMB) (Signed)
02/04/2020  Called patient to remind him of his appointment with Hilma Favors on 02/05/2020 @ 2:00 PM. Spoke with wife.  Patient is aware to have all Medications and supplements available at time of appointment  Vallie Pearson,CPP Notified  Judithann Sheen, [place] Pharmacist Assistant [phone]

## 2020-02-05 ENCOUNTER — Ambulatory Visit: Payer: Medicare HMO

## 2020-02-05 DIAGNOSIS — E1165 Type 2 diabetes mellitus with hyperglycemia: Secondary | ICD-10-CM

## 2020-02-05 DIAGNOSIS — E785 Hyperlipidemia, unspecified: Secondary | ICD-10-CM

## 2020-02-05 NOTE — Chronic Care Management (AMB) (Signed)
Chronic Care Management Pharmacy  Name: Gregory Friedman  MRN: [id number] DOB: [birth year]  Chief Complaint/ HPI  Gregory Friedman,  70 y.o. , male presents for their Follow-Up CCM visit with the clinical pharmacist via telephone due to COVID-19 Pandemic. He has been married for over 30 years. He has 2 children. He recently had surgery is just beginning to walk around the house. He no longer has the bag and he only has a little discomfort. He is still waking up to go to the bathroom because he is drinking a lot fluids. He has had open heart surgery, he has had carcinoma on his neck and this time around the surgery was to scrape the walnut. He was asked to stop ASA for the next 2 weeks. He was on an antibiotic and oxycodone for pain. Patient reports that he is eating well.   PCP : Gregory Chard, MD  Their chronic conditions include: Hypertension, CAD, GERD, Hypothyroidism, Diabetes, mixed hyperlipidemia  Office Visits:  01/01/20 OV: Recommend intake of daily multivitamin, Vitamin D, and calcium. Given Flu Shot HD, declined COVID-19 vaccination.   06/26/19 OV: Presented for DM follow up. Pt was given a Novolog by Endocrinologist (Dr. Kelton Pillar) and directed to use 8 units before meals but he refuses due to risk of hypoglycemia. Was also instructed by Dr. Kelton Pillar to decrease Gregory Friedman to 55 units (since adding Novolog), but pt decided to use 45 units since he is not using short-acting insulin. He reported being more active and drinking celery water (2 celery sticks, 8 Tbsp olive Friedman, 2 Tbsp honey, and 2 lemons). Reported glucose has dropped to 80s. Does not wish to return to endocrinologist. Was prescribed meloxicam and gabapentin by Ortho for back pain but did not start it due to interaction with CAD. He reported drinking Gregory Friedman and his back pain is gone. Labs ordered (HgbA1c, CMP14+EGFR, CBC no Diff). Will assess need for short actin insulin after results of A1c.   03/27/19 OV: Started on  triamcinolone for psoriasis. Labs ordered (Hgb A1c). Tresiba increased to 55 units due to elevated/ worsening A1c. Referred to Endocrinology for DM.   02/27/19 OV: Advised pt to increase Tresiba to 50 units daily. F/u in 2 weeks. Face laceration healing well.   02/17/19 OV: Presented for suture removal from forehead laceration. Area is not well healed. All, but 1 of 6 visible sutures removed. Sutures were difficult to remove. F/U with PCP in 1 week to remove.    Medications: Outpatient Encounter Medications as of 02/05/2020  Medication Sig Note  . Accu-Chek Softclix Lancets lancets USE AS DIRECTED   . aspirin EC 81 MG tablet Take 81 mg by mouth at bedtime.    . Coenzyme Q10 200 MG capsule Take 200 mg by mouth at bedtime.    . Dimethicone-Zinc Oxide (RASH RELIEF EX) Apply 1 application topically 2 (two) times daily as needed.   . docusate sodium (COLACE) 100 MG capsule Take 1 capsule (100 mg total) by mouth daily as needed for up to 30 doses.   [name] enalapril (VASOTEC) 10 MG tablet TAKE 1 TABLET EVERY DAY (Patient taking differently: Take 10 mg by mouth at bedtime.)   . glucose blood (ACCU-CHEK GUIDE) test strip 1 each by Other route 2 (two) times daily. Use as instructed   . insulin degludec (TRESIBA FLEXTOUCH) 100 UNIT/ML FlexTouch Pen Inject 0.4 mLs (40 Units total) into the skin daily. 55 unites per day (Patient taking differently: Inject 40 Units into the skin at bedtime.)   .  Insulin Pen Needle 32G X 4 MM MISC 1 Device by Does not apply route in the morning, at noon, in the evening, and at bedtime.   Gregory Friedman (OMEGA-3) 500 MG CAPS Take 1 capsule by mouth daily.   . meclizine (ANTIVERT) 25 MG tablet TAKE 1 TABLET THREE TIMES DAILY FOR DIZZINESS (Patient taking differently: Take 25 mg by mouth 3 (three) times daily as needed.)   . melatonin 1 MG TABS tablet Take 1 mg by mouth at bedtime.   . metoprolol succinate (TOPROL-XL) 50 MG 24 hr tablet TAKE 1 TABLET EVERY DAY (NEW DIRECTIONS) (Patient  taking differently: Take 50 mg by mouth at bedtime.)   . Misc Natural Products (TART CHERRY ADVANCED PO) Take by mouth at bedtime.   . nitroGLYCERIN (NITROSTAT) 0.4 MG SL tablet Place 1 tablet (0.4 mg total) under the tongue every 5 (five) minutes as needed. Chest pain 01/28/2020: Per pt last taken nitro 01/ 2020  . omeprazole (PRILOSEC) 40 MG capsule TAKE 1 CAPSULE EVERY DAY BEFORE A MEAL (Patient taking differently: Take by mouth daily as needed.)   . oxyCODONE-acetaminophen (PERCOCET) 5-325 MG tablet Take 1 tablet by mouth every 4 (four) hours as needed for up to 12 doses for severe pain.   . simvastatin (ZOCOR) 40 MG tablet TAKE 1 TABLET  EVERY EVENING. (Patient taking differently: Take 40 mg by mouth every evening.)   . tamsulosin (FLOMAX) 0.4 MG CAPS capsule Take 0.4 mg by mouth at bedtime.   . triamcinolone cream (KENALOG) 0.1 % Apply 1 application topically 3 (three) times daily. Apply to rash bid x 7-10 days, then prn when rash return (Patient not taking: Reported on 01/28/2020)    No facility-administered encounter medications on file as of 02/05/2020.   Current Diagnosis/Assessment:  Goals Addressed            This Visit's Progress   . Pharmacy Care Plan       CARE PLAN ENTRY (see longitudinal plan of care for additional care plan information)  Current Barriers:  . Chronic Disease Management support, education, and care coordination needs related to Hypertension, Hyperlipidemia, and Diabetes   Hypertension BP Readings from Last 3 Encounters:  06/26/19 134/82  06/11/19 140/90  05/23/19 (!) 144/82   . Pharmacist Clinical Goal(s): o Over the next 180 days, patient will work with PharmD and providers to maintain BP goal <130/80 . Current regimen:  o Enalapril 54m daily o Metoprolol succinate 226m2 tablets daily . Interventions: o Provided dietary and exercise recommendations . Patient self care activities - Over the next 180 days, patient will: o Check BP daily, document,  and provide at future appointments o Ensure daily salt intake < 2300 mg/day o Exercise for 30 minutes daily 5 times per week  Hyperlipidemia Lab Results  Component Value Date/Time   LDLCALC 57 04/17/2019 03:17 PM   . Pharmacist Clinical Goal(s): o Over the next 90 days, patient will work with PharmD and providers to maintain LDL goal < 70 . Current regimen:  o Simvastatin 4041maily . Interventions: o Provided dietary and exercise recommendations - Increase healthy fat intake, decrease fatty foods o Recommend recheck thyroid hormones at next PCP appointment . Patient self care activities - Over the next 90 days, patient will: o Increase intake of healthy fats (Avocados, walnuts, flaxseed, etc) o Limit intake of fatty foods, fired, foods, and junk foods o Exercise at least 30 minutes 5 times a week  Diabetes Lab Results  Component Value Date/Time  HGBA1C 8.6 (H) 07/03/2019 04:22 PM   HGBA1C 9.4 (H) 03/27/2019 05:25 PM   . Pharmacist Clinical Goal(s): o Over the next 120 days, patient will work with PharmD and providers to achieve A1c goal <7% . Current regimen:  o Tresiba 40 units daily at 8 pm  . Interventions: o Provided dietary and exercise recommendations (mailed handouts to patient) o Discuss titration of Tresiba to 45 units daily and reassess in two weeks. o Assisted with obtaining refill for Tresiba from [employer] patient assistance program o Reviewed patient's most recent blood glucose readings . Patient self care activities - Over the next 60 days, patient will: o Check blood sugar once daily, document, and provide at future appointments o Contact provider with any episodes of hypoglycemia o Increase intake of lean protein o Try PLATE method for meal planning  Medication management . Pharmacist Clinical Goal(s): o Over the next 90 days, patient will work with PharmD and providers to maintain optimal medication adherence . Current pharmacy: [facility] . Interventions o Comprehensive medication review performed. o Continue current medication management strategy . Patient self care activities - Over the next 90 days, patient will: o Focus on medication adherence by consider use of a pill box to organize medications o Take medications as prescribed o Report any questions or concerns to PharmD and/or provider(s)  Please see past updates related to this goal by clicking on the "Past Updates" button in the selected goal         Diabetes   Goal <7% Recent Relevant Labs: Lab Results  Component Value Date/Time   HGBA1C 8.4 (H) 01/01/2020 02:57 PM   HGBA1C 9.3 (H) 09/18/2019 04:26 PM   MICROALBUR 10 10/31/2018 10:57 AM   MICROALBUR 1.0 04/19/2015 12:01 AM   MICROALBUR 0.2 05/14/2014 12:01 AM   Kidney Function Lab Results  Component Value Date/Time   CREATININE 1.58 (H) 01/31/2020 12:11 AM   CREATININE 1.40 (H) 01/30/2020 11:53 AM   CREATININE 1.41 (H) 04/19/2015 12:01 AM   CREATININE 1.35 05/14/2014 12:01 AM   CREATININE 1.4 (H) 05/20/2013 10:58 AM   CREATININE 1.3 11/14/2012 09:19 AM   GFRNONAA 47 (L) 01/31/2020 12:11 AM   GFRAA 50 (L) 01/01/2020 02:57 PM  Stage 3a CKD  Checking BG: Daily, checking   ,Recent FBG Readings: 100-130 Recent pre-meal BG readings:  Recent 2hr PP BG readings:   Recent HS BG readings:  Patient has failed these meds in past: Jardiance (yeast infections), alogliptin, metformin (low GFR), Kombiglyze (persistent hyperglycemia), glyburide (hypoglycemia), GLP-1 agonist (nausea and vomiting) Patient is currently uncontrolled on the following medications:  -Tresiba 40 units daily at 8 pm   Last diabetic Foot exam:  Last diabetic Eye exam: Lab Results  Component Value Date/Time   HMDIABEYEEXA No Retinopathy 09/14/2015 12:00 AM   We discussed:  . Diet and exercise extensively . Pt states that he has less urgency to go to the bathroom now . Pt states that he feels better overall . Episodes of  dizziness are better . Eye doctor noted improvement when he went for his last appointment . Advised pt that I recommended Farxiga to PCP, and this may be discussed at his next appointment with PCP . Tresiba insulin pin lasts for 12 days and he has 2 pins left  . HgbA1c was improved when checked last month . Congratulated pt on improvements in BG and encouraged him to continue healthy diet and exercising   Plan Continue current medications  CPA to send  the patient application to patient, and he will send back.    Hypertension   Office blood pressures are  BP Readings from Last 3 Encounters:  01/31/20 120/72  01/01/20 (!) 144/80  12/31/19 130/72   Pulse : 59 Patient has failed these meds in the past: Amlodipine, diltiazem, isosorbide mononitrate Patient is currently controlled on the following medications:  -Enalapril 63m daily -Metoprolol succinate 263m2 tablets daily  Patient checks BP at home 1-2x per week  Patient home BP readings are ranging: 105/69  We discussed:  Diet and exercise extensively  Recommended that patient check his BP at least 2 times per week   Plan -Continue current medications, will monitor patient closely  Hyperlipidemia   LDL goal < 70  Last lipids Lab Results  Component Value Date   CHOL 152 09/18/2019   HDL 44 09/18/2019   LDLCALC 85 09/18/2019   TRIG 132 09/18/2019   CHOLHDL 3.5 09/18/2019   Hepatic Function Latest Ref Rng & Units 01/01/2020 09/18/2019 07/03/2019  Total Protein 6.0 - 8.5 g/dL 7.7 7.9 7.5  Albumin 3.8 - 4.8 g/dL 4.8 5.0(H) 4.1  AST 0 - 40 IU/L _0 ALT 0 - 44 IU/L _1 Alk Phosphatase 44 - 121 IU/L 75 61 57  Total Bilirubin 0.0 - 1.2 mg/dL 0.4 0.7 0.8  Bilirubin, Direct 0.0 - 0.3 mg/dL - - -     The 10-year ASCVD risk score (GMikey BussingC Jr., et al., 2013) is: 30.9%   Values used to calculate the score:     Age: [age]     Sex: Male     Is Non-Hispanic African American: No     Diabetic: Yes     Tobacco  smoker: No     Systolic Blood Pressure: 12325mHg     Is BP treated: Yes     HDL Cholesterol: 44 mg/dL     Total Cholesterol: 152 mg/dL   Patient has failed these meds in past:  Patient is currently uncontrolled on the following medications:  . Simvastatin 40 mg - take daily   We discussed:    We discussed diet and exercise extensively  Patient is not exercising at home  In a couple weeks or when the doctor clears you  Exercise bike: 1/2 hour everyday   2 Lbs weight exercise for 10 minutes   Breakfast : Oatmeal and eggs  Snack: Coffee, powdered cream   Dinner: Tu[place]urger, or BLT, veggies  He does not eat a lot of carbohydrates   Avoiding fried and fatty foods Plan  Continue current medications  Vaccines   Reviewed and discussed patient's vaccination history.    Immunization History  Administered Date(s) Administered  . Influenza Split 11/24/2010, 12/06/2011  . Influenza, High Dose Seasonal PF 11/17/2014, 10/23/2017, 12/05/2018  . Influenza,inj,Quad PF,6+ Mos 11/25/2012, 01/01/2014  . Pneumococcal Conjugate-13 01/07/2015  . Pneumococcal Polysaccharide-23 10/21/2009  . Tdap 05/27/2009   We discussed:  Shingrix Vaccine - will check into the cost    COVID-19 Vaccine - the safety and benefits of being vaccinated   He is uncomfortable with the  Vaccine    Plan  Recommended patient receive the COVID-19 vaccine in [place].    Medication Management   OTC Medications:  Papaya Pills - as needed for acid reflux  Pt uses [place] all medications Uses pill box? No - Puts them in a drawer to organize Pt endorses 99% compliance  We discussed:  Most medications are free through the mail  Misses a dose of medication once or twice a year  Importance of taking medications daily as directed  Plan Continue current medication management strategy   Follow up: 4 month phone visit   [name], PharmD Clinical  Pharmacist [facility] [phone]

## 2020-02-09 ENCOUNTER — Other Ambulatory Visit: Payer: Self-pay | Admitting: Internal Medicine

## 2020-02-09 DIAGNOSIS — E785 Hyperlipidemia, unspecified: Secondary | ICD-10-CM

## 2020-02-11 ENCOUNTER — Telehealth: Payer: Medicare HMO

## 2020-02-13 ENCOUNTER — Ambulatory Visit: Payer: Medicare HMO | Admitting: Internal Medicine

## 2020-02-13 ENCOUNTER — Telehealth: Payer: Self-pay

## 2020-02-13 ENCOUNTER — Other Ambulatory Visit: Payer: Self-pay

## 2020-02-13 MED ORDER — ENALAPRIL MALEATE 10 MG PO TABS: 10.0000 mg | ORAL_TABLET | Freq: Every day | ORAL | 1 refills | Status: DC

## 2020-02-13 NOTE — Chronic Care Management (AMB) (Signed)
Chronic Care Management Pharmacy Assistant   Name: Gregory Friedman  MRN: [id number] DOB: January 29, 1950  Reason for Encounter: Patient Assistance Coordination  02/13/20- Attempted to outreach the patient regarding [employer] patient assistance application. No answer; unable to leave a voicemail.  02/23/20-Unsuccessful telephone outreach to the patient. Unable to leave a voicemail.  PCP : [name], MD  Allergies:  No Known Allergies  Medications: Outpatient Encounter Medications as of 02/13/2020  Medication Sig Note   Accu-Chek Softclix Lancets lancets USE AS DIRECTED    aspirin EC 81 MG tablet Take 81 mg by mouth at bedtime.  (Patient not taking: Reported on 02/05/2020) 02/05/2020: Wait to start to taking ASA 81 after surgery, will be seen on 02/23/19   Coenzyme Q10 200 MG capsule Take 200 mg by mouth at bedtime.     docusate sodium (COLACE) 100 MG capsule Take 1 capsule (100 mg total) by mouth daily as needed for up to 30 doses.    enalapril (VASOTEC) 10 MG tablet TAKE 1 TABLET EVERY DAY (Patient taking differently: Take 10 mg by mouth at bedtime.)    glucose blood (ACCU-CHEK GUIDE) test strip 1 each by Other route 2 (two) times daily. Use as instructed    insulin degludec (TRESIBA FLEXTOUCH) 100 UNIT/ML FlexTouch Pen Inject 0.4 mLs (40 Units total) into the skin daily. 55 unites per day (Patient taking differently: Inject 40 Units into the skin at bedtime.)    Insulin Pen Needle 32G X 4 MM MISC 1 Device by Does not apply route in the morning, at noon, in the evening, and at bedtime.    Krill Oil (OMEGA-3) 500 MG CAPS Take 1 capsule by mouth daily.    meclizine (ANTIVERT) 25 MG tablet TAKE 1 TABLET THREE TIMES DAILY FOR DIZZINESS (Patient taking differently: Take 25 mg by mouth 3 (three) times daily as needed.)    melatonin 1 MG TABS tablet Take 1 mg by mouth at bedtime. (Patient not taking: Reported on 02/05/2020)    metoprolol succinate (TOPROL-XL) 50 MG 24 hr tablet  TAKE 1 TABLET EVERY DAY (NEW DIRECTIONS) (Patient taking differently: Take 50 mg by mouth at bedtime.)    Misc Natural Products (TART CHERRY ADVANCED PO) Take by mouth at bedtime.    nitroGLYCERIN (NITROSTAT) 0.4 MG SL tablet Place 1 tablet (0.4 mg total) under the tongue every 5 (five) minutes as needed. Chest pain 01/28/2020: Per pt last taken nitro 01/ 2020   omeprazole (PRILOSEC) 40 MG capsule TAKE 1 CAPSULE EVERY DAY BEFORE A MEAL (Patient not taking: Reported on 02/05/2020)    oxyCODONE-acetaminophen (PERCOCET) 5-325 MG tablet Take 1 tablet by mouth every 4 (four) hours as needed for up to 12 doses for severe pain.    simvastatin (ZOCOR) 40 MG tablet TAKE 1 TABLET  EVERY EVENING.    triamcinolone cream (KENALOG) 0.1 % Apply 1 application topically 3 (three) times daily. Apply to rash bid x 7-10 days, then prn when rash return    No facility-administered encounter medications on file as of 02/13/2020.    Current Diagnosis: Patient Active Problem List   Diagnosis Date Noted   BPH (benign prostatic hyperplasia) 01/30/2020   Low back pain 06/11/2019   Type 2 diabetes mellitus with hyperglycemia, with long-term current use of insulin ([place]) 05/23/2019   Type 2 diabetes mellitus with stage 3a chronic kidney disease, with long-term current use of insulin ([place]) 05/23/2019   Dyslipidemia 05/23/2019   Psoriasis 12/12/2018   Skin lesion 10/31/2018   Diabetes  mellitus type 2, insulin dependent ([place]) 09/12/2018   Elevated serum creatinine 09/12/2018   Hypothyroidism 10/10/2017   Atherosclerosis of aorta ([place]) 10/10/2017   Unspecified skin changes 10/10/2017   Other long term (current) drug therapy 10/10/2017   Abdominal pain 09/21/2017   Nausea & vomiting 09/21/2017   History of head and neck cancer 09/21/2017   History of colonic polyps 04/04/2016   Dysphagia 04/04/2016   Bloating 04/04/2016   Diarrhea 04/04/2016   Trigger finger of right thumb 08/23/2015   Chest  pain 07/15/2015   Pain in the chest    S/P CABG (coronary artery bypass graft)    Noncompliance with medications 07/08/2015   Acute gout 04/19/2015   GERD (gastroesophageal reflux disease) 05/14/2014   Depression, major, in remission ([place]) 03/27/2013   Allergic rhinitis, cause unspecified 03/27/2013   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  Raynelle Highland, [place] Pharmacist Assistant [phone]

## 2020-02-17 ENCOUNTER — Other Ambulatory Visit: Payer: Self-pay | Admitting: Internal Medicine

## 2020-02-19 NOTE — Patient Instructions (Signed)
Visit Information  Goals Addressed            This Visit's Progress   . Pharmacy Care Plan       CARE PLAN ENTRY (see longitudinal plan of care for additional care plan information)  Current Barriers:  . Chronic Disease Management support, education, and care coordination needs related to Hypertension, Hyperlipidemia, and Diabetes   Hypertension BP Readings from Last 3 Encounters:  06/26/19 134/82  06/11/19 140/90  05/23/19 (!) 144/82   . Pharmacist Clinical Goal(s): o Over the next 180 days, patient will work with PharmD and providers to maintain BP goal <130/80 . Current regimen:  o Enalapril 10mg  daily o Metoprolol succinate 25mg  2 tablets daily . Interventions: o Provided dietary and exercise recommendations . Patient self care activities - Over the next 180 days, patient will: o Check BP daily, document, and provide at future appointments o Ensure daily salt intake < 2300 mg/day o Exercise for 30 minutes daily 5 times per week  Hyperlipidemia Lab Results  Component Value Date/Time   LDLCALC 57 04/17/2019 03:17 PM   . Pharmacist Clinical Goal(s): o Over the next 90 days, patient will work with PharmD and providers to maintain LDL goal < 70 . Current regimen:  o Simvastatin 40mg  daily . Interventions: o Provided dietary and exercise recommendations - Increase healthy fat intake, decrease fatty foods o Recommend recheck thyroid hormones at next PCP appointment . Patient self care activities - Over the next 90 days, patient will: o Increase intake of healthy fats (Avocados, walnuts, flaxseed, etc) o Limit intake of fatty foods, fired, foods, and junk foods o Exercise at least 30 minutes 5 times a week  Diabetes Lab Results  Component Value Date/Time   HGBA1C 8.6 (H) 07/03/2019 04:22 PM   HGBA1C 9.4 (H) 03/27/2019 05:25 PM   . Pharmacist Clinical Goal(s): o Over the next 120 days, patient will work with PharmD and providers to achieve A1c goal <7% . Current  regimen:  o Tresiba 40 units daily at 8 pm  . Interventions: o Provided dietary and exercise recommendations (mailed handouts to patient) o Discuss titration of Tresiba to 45 units daily and reassess in two weeks. o Assisted with obtaining refill for Tresiba from [employer] patient assistance program o Reviewed patient's most recent blood glucose readings . Patient self care activities - Over the next 60 days, patient will: o Check blood sugar once daily, document, and provide at future appointments o Contact provider with any episodes of hypoglycemia o Increase intake of lean protein o Try PLATE method for meal planning  Medication management . Pharmacist Clinical Goal(s): o Over the next 90 days, patient will work with PharmD and providers to maintain optimal medication adherence . Current pharmacy: [facility] . Interventions o Comprehensive medication review performed. o Continue current medication management strategy . Patient self care activities - Over the next 90 days, patient will: o Focus on medication adherence by consider use of a pill box to organize medications o Take medications as prescribed o Report any questions or concerns to PharmD and/or provider(s)  Please see past updates related to this goal by clicking on the "Past Updates" button in the selected goal         The patient verbalized understanding of instructions, educational materials, and care plan provided today and agreed to receive a mailed copy of patient instructions, educational materials, and care plan.   The pharmacy team will reach out to the patient again over the  next 90 days.   Mayford Knife, [facility]

## 2020-02-23 DIAGNOSIS — R3914 Feeling of incomplete bladder emptying: Secondary | ICD-10-CM | POA: Diagnosis not present

## 2020-02-23 DIAGNOSIS — N401 Enlarged prostate with lower urinary tract symptoms: Secondary | ICD-10-CM | POA: Diagnosis not present

## 2020-02-24 ENCOUNTER — Emergency Department (HOSPITAL_COMMUNITY)
Admission: EM | Admit: 2020-02-24 | Discharge: 2020-02-24 | Disposition: A | Discharge status: Home / Self Care | Payer: Medicare HMO | Attending: Emergency Medicine | Admitting: Emergency Medicine

## 2020-02-24 ENCOUNTER — Encounter (HOSPITAL_COMMUNITY): Payer: Self-pay | Admitting: *Deleted

## 2020-02-24 ENCOUNTER — Other Ambulatory Visit: Payer: Self-pay

## 2020-02-24 DIAGNOSIS — N1831 Chronic kidney disease, stage 3a: Secondary | ICD-10-CM | POA: Insufficient documentation

## 2020-02-24 DIAGNOSIS — E039 Hypothyroidism, unspecified: Secondary | ICD-10-CM | POA: Insufficient documentation

## 2020-02-24 DIAGNOSIS — Z951 Presence of aortocoronary bypass graft: Secondary | ICD-10-CM | POA: Diagnosis not present

## 2020-02-24 DIAGNOSIS — Z7982 Long term (current) use of aspirin: Secondary | ICD-10-CM | POA: Diagnosis not present

## 2020-02-24 DIAGNOSIS — Z79899 Other long term (current) drug therapy: Secondary | ICD-10-CM | POA: Diagnosis not present

## 2020-02-24 DIAGNOSIS — I2510 Atherosclerotic heart disease of native coronary artery without angina pectoris: Secondary | ICD-10-CM | POA: Insufficient documentation

## 2020-02-24 DIAGNOSIS — I129 Hypertensive chronic kidney disease with stage 1 through stage 4 chronic kidney disease, or unspecified chronic kidney disease: Secondary | ICD-10-CM | POA: Insufficient documentation

## 2020-02-24 DIAGNOSIS — Z794 Long term (current) use of insulin: Secondary | ICD-10-CM | POA: Diagnosis not present

## 2020-02-24 DIAGNOSIS — R338 Other retention of urine: Secondary | ICD-10-CM | POA: Diagnosis not present

## 2020-02-24 DIAGNOSIS — E1122 Type 2 diabetes mellitus with diabetic chronic kidney disease: Secondary | ICD-10-CM | POA: Insufficient documentation

## 2020-02-24 DIAGNOSIS — R339 Retention of urine, unspecified: Secondary | ICD-10-CM | POA: Diagnosis not present

## 2020-02-24 DIAGNOSIS — R300 Dysuria: Secondary | ICD-10-CM | POA: Diagnosis present

## 2020-02-24 DIAGNOSIS — Z85818 Personal history of malignant neoplasm of other sites of lip, oral cavity, and pharynx: Secondary | ICD-10-CM | POA: Diagnosis not present

## 2020-02-24 LAB — URINALYSIS, ROUTINE W REFLEX MICROSCOPIC
Bacteria, UA: NONE SEEN
Bilirubin Urine: NEGATIVE
Glucose, UA: 50 mg/dL — AB
Ketones, ur: NEGATIVE mg/dL
Leukocytes,Ua: NEGATIVE
Nitrite: NEGATIVE
Protein, ur: 100 mg/dL — AB
RBC / HPF: >50 RBC/hpf — ABNORMAL HIGH (ref 0–5)
Specific Gravity, Urine: 1.016 (ref 1.005–1.030)
pH: 6 (ref 5.0–8.0)

## 2020-02-24 NOTE — ED Triage Notes (Signed)
Pt reports recent prostate procedure. Initially had foley in but it was removed. Started having pain with urination again today with blood in urine. Went to urologist and was told it was normal following procedure but now it has become more difficult for him to urinate.

## 2020-02-24 NOTE — ED Provider Notes (Signed)
[facility] EMERGENCY DEPARTMENT Provider Note   CSN: [id number] Arrival date & time: 02/24/20  0117     History Chief Complaint  Patient presents with  . Urinary Retention    Gregory Friedman is a 70 y.o. male.  HPI    Patient presents for concerns of urinary retention.  Patient reports he underwent a TURP procedure on January 7.  He had no complications and went home.  He has been doing well and passing urine.  He had a urology follow-up yesterday without any issues.  Later in the evening he began having difficulty urinating and noticed blood in his urine.  No fevers or vomiting.  He does report lower abdominal pain that is worsening.  No back pain.  He does not take anticoagulation.  Nothing is improving his symptoms Past Medical History:  Diagnosis Date  . Anxiety   . Arthritis   . Benign localized prostatic hyperplasia with lower urinary tract symptoms (LUTS)   . Bradycardia    chronic, asymptomatic per cardiology note  . 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 CKD (chronic kidney disease), stage III ([place])   . Coronary artery disease cardiologist--- dr Mamie Nick. [facility] Challenger   remote hx MI;  s/p  PCI and stenting in 2003 x2 to LAD ;  s/p CABG x2 in 2004 (LIMA --LAD,  SVG--OM);  s/p  PCI stenting x2 to LAD/ OM;  cardiac cath 05-25-2010;  stress test 07-15-2015 no ischemia, ef 45-50%, intermediate risk  . Decreased range of motion of neck    s/p  radical neck dissection for cancer in 2014  . Depression   . Diverticulosis   . GERD (gastroesophageal reflux disease)   . History of colon polyps   . History of concussion 01/2018   per pt had vertigo fell and hit head, no loc, no residual  . History of  esophageal stricture 02/16/2011   s/p  dilatation  . History of gout    toe  . History of MI (myocardial infarction) 2003   s/p  PCI and stenting  . 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 Hypertension    followed by cardiology and pcp  . Hypothyroidism, unspecified    followed by pcp---  per pt never been on medication  . Limitation of opening of mouth    s/p  radical neck dissection due to cancer in 2014,  (01-28-2020  pt can get 2 finger's, but not 3)  . 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 Mixed hyperlipidemia   . PONV (postoperative nausea and vomiting)   . Psoriasis   . S/P CABG x 2 2004  . S/P drug eluting coronary stent placement    2003  x2 to LAD;  2006 post cabg , des x2 to LAD/ OM  . S/P radiation therapy    Received 8 fractions, then stopped due to mucositis with Radiation/ Cisplatin  . Type 2 diabetes mellitus treated with insulin ([place])    followed by pcp----  (01-28-2020 per pt checks blood sugar daily in am,  fasting sugar--- 100-130)  . Vertigo, intermittent  Patient Active Problem List   Diagnosis Date Noted  . BPH (benign prostatic hyperplasia) 01/30/2020  . Low back pain 06/11/2019  . Type 2 diabetes mellitus with hyperglycemia, with long-term current use of insulin ([place]) 05/23/2019  . Type 2 diabetes mellitus with stage 3a chronic kidney disease, with long-term current use of insulin ([place]) 05/23/2019  . Dyslipidemia 05/23/2019  . Psoriasis 12/12/2018  . Skin lesion 10/31/2018  . Diabetes mellitus type 2, insulin dependent ([place]) 09/12/2018  . Elevated serum creatinine 09/12/2018  . Hypothyroidism 10/10/2017  . Atherosclerosis of aorta ([place]) 10/10/2017  . Unspecified skin changes 10/10/2017  . Other long term (current) drug therapy 10/10/2017  . Abdominal pain 09/21/2017  . Nausea & vomiting 09/21/2017  . History of head and neck cancer 09/21/2017  . History of  colonic polyps 04/04/2016  . Dysphagia 04/04/2016  . Bloating 04/04/2016  . Diarrhea 04/04/2016  . Trigger finger of right thumb 08/23/2015  . Chest pain 07/15/2015  . Pain in the chest   . S/P CABG (coronary artery bypass graft)   . Noncompliance with medications 07/08/2015  . Acute gout 04/19/2015  . GERD (gastroesophageal reflux disease) 05/14/2014  . Depression, major, in remission ([place]) 03/27/2013  . Allergic rhinitis, cause unspecified 03/27/2013  . 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 Depressive disorder, not elsewhere classified 12/06/2011  . Nonspecific (abnormal) findings on radiological and other examination of gastrointestinal tract 02/10/2011  . Dysphagia, unspecified(787.20) 02/10/2011  . Palpitations 02/02/2011  . Dizziness 07/15/2010  . DIAB W/OPHTH MANIFESTS TYPE I [JUV TYPE] UNCNTRL 08/02/2009  . Mixed hyperlipidemia 08/02/2009  . Essential hypertension 08/02/2009  . CAD (coronary artery disease) 08/02/2009    Past Surgical History:  Procedure Laterality Date  . CARDIAC CATHETERIZATION  05-25-2010  dr Lizbeth Bark   LIMA-- LAD atretiz,  SVG--OM occluded, multiple areas which could be cause chest pain or ischemia (severe disease distal LCx or moderate disease Diagonal)  . COLONOSCOPY WITH PROPOFOL  last one 05-30-2017  dr Carlean Purl  . CORONARY ANGIOPLASTY WITH STENT PLACEMENT  2003   PCI and stent x2 to LAD  . CORONARY ANGIOPLASTY WITH STENT PLACEMENT  10/ 2006  dr Lizbeth Bark   PCI and DES x2  to LAD and OM  . CORONARY ARTERY BYPASS GRAFT  2004  in [place]   LIMA --- LAD,  SVG -- OM  . CYSTOSCOPY  02/09/2011   Procedure: CYSTOSCOPY;  Surgeon: Marissa Nestle, MD;  Location: [facility];  Service: Urology;  Laterality: N/A;  . ESOPHAGOGASTRODUODENOSCOPY  02-16-2011  dr Carlean Purl  . ESOPHAGOGASTRODUODENOSCOPY (EGD) WITH ESOPHAGEAL DILATION  02/16/2011   with Propofol  . FOOT ARTHRODESIS, SUBTALAR Right 10/13/2009  . MASS EXCISION N/A 03/13/2012    Procedure: EXCISION LEFT BUCCAL MUCOSA;  Surgeon: Rozetta Nunnery, MD;  Location: [facility];  Service: ENT;  Laterality: N/A;  . RADICAL NECK DISSECTION Left 03/13/2012   Procedure: RADICAL NECK DISSECTION;  Surgeon: Rozetta Nunnery, MD;  Location: [facility];  Service: ENT;  Laterality: Left;  . TOOTH EXTRACTION N/A 03/13/2012   Procedure: EXTRACTION MOLARS;  Surgeon: Rozetta Nunnery, MD;  Location: [facility];  Service: ENT;  Laterality: N/A;  . TRANSURETHRAL RESECTION OF PROSTATE N/A 01/30/2020   Procedure: TRANSURETHRAL RESECTION OF THE PROSTATE (TURP);  Surgeon: Janith Lima, MD;  Location: [facility];  Service: Urology;  Laterality: N/A;  . TRIGGER FINGER RELEASE Right 02/24/2016   Procedure: RELEASE TRIGGER FINGER/A-1 PULLEY RIGHT THUMB;  Surgeon:  Daryll Brod, MD;  Location: [facility];  Service: Orthopedics;  Laterality: Right;  FAB       Family History  Problem Relation Age of Onset  . Stroke Sister   . Diabetes Brother   . Seizures Mother   . Asthma Father   . Thyroid cancer Daughter   . Diabetes Sister   . Parkinson's disease Brother   . Heart disease Brother   . Diabetes Brother   . Colon cancer Neg Hx     Social History   Tobacco Use  . Smoking status: Never Smoker  . Smokeless tobacco: Never Used  Vaping Use  . Vaping Use: Never used  Substance Use Topics  . Alcohol use: No  . Drug use: Never    Home Medications Prior to Admission medications   Medication Sig Start Date End Date Taking? Authorizing Provider  Accu-Chek Softclix Lancets lancets USE AS DIRECTED 02/17/20   [name], MD  aspirin EC 81 MG tablet Take 81 mg by mouth at bedtime.  Patient not taking: Reported on 02/05/2020    [provider]  Coenzyme Q10 200 MG capsule Take 200 mg by mouth at bedtime.     [provider]  docusate sodium (COLACE) 100 MG capsule Take 1 capsule (100 mg total) by mouth daily as needed for up to 30 doses. 01/30/20   Janith Lima, MD  enalapril (VASOTEC) 10 MG tablet Take 1 tablet (10 mg total) by mouth daily. 02/13/20   [name], MD  glucose blood (ACCU-CHEK GUIDE) test strip 1 each by Other route 2 (two) times daily. Use as instructed 03/25/18   Rodriguez-Southworth, Sunday Spillers, PA-C  insulin degludec (TRESIBA FLEXTOUCH) 100 UNIT/ML FlexTouch Pen Inject 0.4 mLs (40 Units total) into the skin daily. 55 unites per day Patient taking differently: Inject 40 Units into the skin at bedtime. 05/23/19   Shamleffer, Melanie Crazier, MD  Insulin Pen Needle 32G X 4 MM MISC 1 Device by Does not apply route in the morning, at noon, in the evening, and at bedtime. 05/23/19   Shamleffer, Melanie Crazier, MD  [name] (OMEGA-3) 500 MG CAPS Take 1 capsule by mouth daily.    [provider]  meclizine (ANTIVERT) 25 MG tablet TAKE 1 TABLET THREE TIMES DAILY FOR DIZZINESS Patient taking differently: Take 25 mg by mouth 3 (three) times daily as needed. 10/07/19   [name], MD  melatonin 1 MG TABS tablet Take 1 mg by mouth at bedtime. Patient not taking: Reported on 02/05/2020    [provider]  metoprolol succinate (TOPROL-XL) 50 MG 24 hr tablet TAKE 1 TABLET EVERY DAY (NEW DIRECTIONS) Patient taking differently: Take 50 mg by mouth at bedtime. 10/07/19   Fay Records, MD  Misc Natural Products (TART CHERRY ADVANCED PO) Take by mouth at bedtime.    [provider]  nitroGLYCERIN (NITROSTAT) 0.4 MG SL tablet Place 1 tablet (0.4 mg total) under the tongue every 5 (five) minutes as needed. Chest pain 06/16/16   Fay Records, MD  omeprazole (PRILOSEC) 40 MG capsule TAKE 1 CAPSULE EVERY DAY BEFORE A MEAL Patient not taking: Reported on 02/05/2020 12/05/17   [name], MD  oxyCODONE-acetaminophen (PERCOCET) 5-325 MG tablet Take 1 tablet by mouth every 4 (four) hours as needed for up to 12 doses for severe pain. 01/30/20   Janith Lima, MD  simvastatin (ZOCOR) 40 MG tablet TAKE 1 TABLET  EVERY EVENING.  02/09/20   Minette Brine, FNP  triamcinolone cream (KENALOG) 0.1 % Apply 1 application topically 3 (three) times daily. Apply to rash bid x 7-10 days, then prn when rash return 03/27/19   Rodriguez-Southworth, Sunday Spillers, PA-C    Allergies    Patient has no known allergies.  Review of Systems   Review of Systems  Constitutional: Negative for fever.  Cardiovascular: Negative for chest pain.  Gastrointestinal: Positive for abdominal pain.  Genitourinary: Positive for difficulty urinating.  Musculoskeletal: Negative for back pain.  All other systems reviewed and are negative.   Physical Exam Updated Vital Signs BP (!) 177/85   Pulse 72   Temp (!) 97.5 F (36.4 C) (Oral)   Resp 16   SpO2 96%   Physical Exam CONSTITUTIONAL: Well developed/well nourished HEAD: Normocephalic/atraumatic EYES: EOMI NECK: supple no meningeal signs SPINE/BACK:entire spine nontender CV: S1/S2 noted, no murmurs/rubs/gallops noted LUNGS: Lungs are clear to auscultation bilaterally, no apparent distress ABDOMEN: soft, mild suprapubic tenderness, no rebound or guarding, bowel sounds noted throughout abdomen GU:no cva tenderness NEURO: Pt is awake/alert/appropriate, moves all extremitiesx4.  No facial droop.   EXTREMITIES: pulses normal/equal, full ROM SKIN: warm, color normal PSYCH: no abnormalities of mood noted, alert and oriented to situation  ED Results / Procedures / Treatments   Labs (all labs ordered are listed, but only abnormal results are displayed) Labs Reviewed  URINALYSIS, ROUTINE W REFLEX MICROSCOPIC - Abnormal; Notable for the following components:      Result Value   Color, Urine AMBER (*)    APPearance CLOUDY (*)    Glucose, UA 50 (*)    Hgb urine dipstick MODERATE (*)    Protein, ur 100 (*)    RBC / HPF >50 (*)    All other components within normal limits  URINE CULTURE    EKG None  Radiology No results found.  Procedures Procedures   Medications Ordered in ED Medications  - No data to display  ED Course  I have reviewed the triage vital signs and the nursing notes.  Pertinent labs  results that were available during my care of the patient were reviewed by me and considered in my medical decision making (see chart for details).    MDM Rules/Calculators/A&P                          Patient presents after recent TURP procedure with urinary retention.  Foley catheter was placed and the patient had good urine output with bloody urine.  Discussed with Dr. Tresa Moore with urology.  He will help arrange follow-up in 1 week Final Clinical Impression(s) / ED Diagnoses Final diagnoses:  Urinary retention    Rx / DC Orders ED Discharge Orders    None       Ripley Fraise, MD 02/24/20 [phone]

## 2020-02-24 NOTE — ED Notes (Signed)
Pt bladder scan resulted 287mL

## 2020-02-24 NOTE — ED Notes (Signed)
Patient continues to c/o discomfort and feeling the need to urinate without being able to . Repeat bladder scan revealed 404. Pt advised not to drink anything while waiting to be seen by provider.

## 2020-02-25 LAB — URINE CULTURE: Culture: NO GROWTH

## 2020-02-26 ENCOUNTER — Telehealth: Payer: Self-pay

## 2020-02-26 NOTE — Telephone Encounter (Cosign Needed)
Patient has [employer] and reports copay for [name] U-200 is cost prohibitive at this time.  Reviewed application process for [employer]  patient assistance program. Patient meets income/out of pocket spend criteria for the program. Patient will provide proof of income, out of pocket spend report, and will sign application. Will collaborate with prescriber Dr. Baird Cancer  for the provider portion of application. Once completed, application will be submitted via Mail   Patient is going to pick a sample of Tresiba U-200 and sign paperwork, he is going to bring his financial information back to the office.   He reports that he missed the call completed by CPA because he did not know the number but will be answering calls from now on.   Patient assistance program Fax number: [phone] Mayford Knife, [facility]

## 2020-03-02 ENCOUNTER — Telehealth: Payer: Self-pay

## 2020-03-02 NOTE — Chronic Care Management (AMB) (Addendum)
Chronic Care Management Pharmacy Assistant   Name: Gregory Friedman  MRN: [id number] DOB: Jun 02, 1950  Reason for Encounter: Hypertension/Diabetes Adherence Call.  Patient Questions:  1.  Have you seen any other providers since your last visit? Yes, 02/24/20-Wickline, Donald, MD (ED).     2.  Any changes in your medicines or health? No    PCP : Gregory Chard, MD  Allergies:  No Known Allergies  Medications: Outpatient Encounter Medications as of 03/02/2020  Medication Sig Note   Accu-Chek Softclix Lancets lancets USE AS DIRECTED    aspirin EC 81 MG tablet Take 81 mg by mouth at bedtime.  (Patient not taking: Reported on 02/05/2020) 02/05/2020: Wait to start to taking ASA 81 after surgery, will be seen on 02/23/19   Coenzyme Q10 200 MG capsule Take 200 mg by mouth at bedtime.     docusate sodium (COLACE) 100 MG capsule Take 1 capsule (100 mg total) by mouth daily as needed for up to 30 doses.    enalapril (VASOTEC) 10 MG tablet Take 1 tablet (10 mg total) by mouth daily.    glucose blood (ACCU-CHEK GUIDE) test strip 1 each by Other route 2 (two) times daily. Use as instructed    insulin degludec (TRESIBA FLEXTOUCH) 100 UNIT/ML FlexTouch Pen Inject 0.4 mLs (40 Units total) into the skin daily. 55 unites per day (Patient taking differently: Inject 40 Units into the skin at bedtime.)    Insulin Pen Needle 32G X 4 MM MISC 1 Device by Does not apply route in the morning, at noon, in the evening, and at bedtime.    Krill Oil (OMEGA-3) 500 MG CAPS Take 1 capsule by mouth daily.    meclizine (ANTIVERT) 25 MG tablet TAKE 1 TABLET THREE TIMES DAILY FOR DIZZINESS (Patient taking differently: Take 25 mg by mouth 3 (three) times daily as needed.)    melatonin 1 MG TABS tablet Take 1 mg by mouth at bedtime. (Patient not taking: Reported on 02/05/2020)    metoprolol succinate (TOPROL-XL) 50 MG 24 hr tablet TAKE 1 TABLET EVERY DAY (NEW DIRECTIONS) (Patient taking differently: Take 50 mg by mouth at  bedtime.)    Misc Natural Products (TART CHERRY ADVANCED PO) Take by mouth at bedtime.    nitroGLYCERIN (NITROSTAT) 0.4 MG SL tablet Place 1 tablet (0.4 mg total) under the tongue every 5 (five) minutes as needed. Chest pain 01/28/2020: Per pt last taken nitro 01/ 2020   omeprazole (PRILOSEC) 40 MG capsule TAKE 1 CAPSULE EVERY DAY BEFORE A MEAL (Patient not taking: Reported on 02/05/2020)    oxyCODONE-acetaminophen (PERCOCET) 5-325 MG tablet Take 1 tablet by mouth every 4 (four) hours as needed for up to 12 doses for severe pain.    simvastatin (ZOCOR) 40 MG tablet TAKE 1 TABLET  EVERY EVENING.    triamcinolone cream (KENALOG) 0.1 % Apply 1 application topically 3 (three) times daily. Apply to rash bid x 7-10 days, then prn when rash return    No facility-administered encounter medications on file as of 03/02/2020.    Current Diagnosis: Patient Active Problem List   Diagnosis Date Noted   BPH (benign prostatic hyperplasia) 01/30/2020   Low back pain 06/11/2019   Type 2 diabetes mellitus with hyperglycemia, with long-term current use of insulin ([place]) 05/23/2019   Type 2 diabetes mellitus with stage 3a chronic kidney disease, with long-term current use of insulin ([place]) 05/23/2019   Dyslipidemia 05/23/2019   Psoriasis 12/12/2018   Skin lesion 10/31/2018   Diabetes mellitus  type 2, insulin dependent (Gregory Friedman) 09/12/2018   Elevated serum creatinine 09/12/2018   Hypothyroidism 10/10/2017   Atherosclerosis of aorta ([place]) 10/10/2017   Unspecified skin changes 10/10/2017   Other long term (current) drug therapy 10/10/2017   Abdominal pain 09/21/2017   Nausea & vomiting 09/21/2017   History of head and neck cancer 09/21/2017   History of colonic polyps 04/04/2016   Dysphagia 04/04/2016   Bloating 04/04/2016   Diarrhea 04/04/2016   Trigger finger of right thumb 08/23/2015   Chest pain 07/15/2015   Pain in the chest    S/P CABG (coronary artery bypass graft)    Noncompliance with medications  07/08/2015   Acute gout 04/19/2015   GERD (gastroesophageal reflux disease) 05/14/2014   Depression, major, in remission ([place]) 03/27/2013   Allergic rhinitis, cause unspecified 03/27/2013   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 Spoke with patient regarding BP  Recent Office Vitals: BP Readings from Last 3 Encounters:  02/24/20 (!) 142/82  01/31/20 120/72  01/01/20 (!) 144/80   Pulse Readings from Last 3 Encounters:  02/24/20 66  01/31/20 (!) 55  01/01/20 (!) 59    Wt Readings from Last 3 Encounters:  01/30/20 193 lb 9.6 oz (87.8 kg)  01/01/20 197 lb 12.8 oz (89.7 kg)  12/31/19 199 lb (90.3 kg)     Kidney Function Lab Results  Component Value Date/Time   CREATININE 1.58 (H) 01/31/2020 12:11 AM   CREATININE 1.40 (H) 01/30/2020 11:53 AM   CREATININE 1.41 (H) 04/19/2015 12:01 AM   CREATININE 1.35 05/14/2014 12:01 AM   CREATININE 1.4 (H) 05/20/2013 10:58 AM   CREATININE 1.3 11/14/2012 09:19 AM   GFRNONAA 47 (L) 01/31/2020 12:11 AM   GFRAA 50 (L) 01/01/2020 02:57 PM    BMP Latest Ref Rng & Units 01/31/2020 01/30/2020 01/01/2020  Glucose 70 - 99 mg/dL 140(H) 126(H) 186(H)  BUN 8 - 23 mg/dL 16 19 11   Creatinine 0.61 - 1.24 mg/dL 1.58(H) 1.40(H) 1.59(H)  BUN/Creat Ratio 10 - 24 - - 7(L)  Sodium 135 - 145 mmol/L 137 142 141  Potassium 3.5 - 5.1 mmol/L 3.5 4.6 4.4  Chloride 98 - 111 mmol/L 102 102 100  CO2 22 - 32 mmol/L 28 - 26  Calcium 8.9 - 10.3 mg/dL 8.7(L) - 9.8    Current  antihypertensive regimen:  Enalapril 10mg  daily Metoprolol succinate 50mg  daily  How often are you checking your Blood Pressure?  Unable to check at this time.    Current home BP readings: None   What recent interventions/DTPs have been made by any provider to improve Blood Pressure control since last CPP Visit:  Recent interventions was to check BP daily, document, and provide at future appointments, Ensure daily salt intake < 2300 mg/day, Exercise for 30 minutes daily 5 times per week.  Any recent hospitalizations or ED visits since last visit with CPP? Yes   What diet changes have been made to improve Blood Pressure Control?  Patient reports eating oatmeal, two boiled eggs, cup of coffee, bananas, strawberries, BLT, [place] [employer]. What exercise is being done  to improve your Blood Pressure Control?  Because of bag he is physically limited but when he is he does stretching and use weights.  Adherence Review: Is the patient currently on ACE/ARB medication? Yes Does the patient have >5 day gap between last estimated fill dates? No   Recent Relevant Labs: Lab Results  Component Value Date/Time   HGBA1C 8.4 (H) 01/01/2020 02:57 PM   HGBA1C 9.3 (H) 09/18/2019 04:26 PM   MICROALBUR 10 10/31/2018 10:57 AM   MICROALBUR 1.0 04/19/2015 12:01 AM   MICROALBUR 0.2 05/14/2014 12:01 AM    Kidney Function Lab Results  Component Value Date/Time   CREATININE 1.58 (H) 01/31/2020 12:11 AM   CREATININE 1.40 (H) 01/30/2020 11:53 AM   CREATININE 1.41 (H) 04/19/2015 12:01 AM   CREATININE 1.35 05/14/2014 12:01 AM   CREATININE 1.4 (H) 05/20/2013 10:58 AM   CREATININE 1.3 11/14/2012 09:19 AM   GFRNONAA 47 (L) 01/31/2020 12:11 AM   GFRAA 50 (L) 01/01/2020 02:57 PM    Current antihyperglycemic regimen:  Tresiba 40 units daily at 8 pm   What recent interventions/DTPs have been made to improve glycemic control:  [place] interventions was to check blood sugar once daily, document, and provide at  future appointments, Contact provider with any episodes of hypoglycemia, Increase intake of lean protein, Try PLATE method for meal planning.  Have there been any recent hospitalizations or ED visits since last visit with CPP? Yes   Patient denies hypoglycemic symptoms, including Pale, Sweaty, Shaky, Hungry, Nervous/irritable and Vision changes   Patient denies hyperglycemic symptoms, including blurry vision, excessive thirst, fatigue, polyuria and weakness   How often are you checking your blood sugar? Patient reports checking blood sugars a week before he was in the hospital on 02/24/20. Patient states he has not been able to check.  What are your blood sugars ranging?  Fasting: 120,117 Before meals: None After meals: None Bedtime: None  During the week, how often does your blood glucose drop below 70? Never   Are you checking your feet daily/regularly? Patient states he checks both feet daily with nothing to report.  Adherence Review: Is the patient currently on a STATIN medication? Yes Is the patient currently on ACE/ARB medication? Yes Does the patient have >5 day gap between last estimated fill dates? No    Goals Addressed   None     Follow-Up:  Pharmacist Review-Patient reports he carries a catheter bag at this time and it will be removed on Thursday 02/02/20. Urologist instructed not to take any medications except insulin so prostate can heal due to having surgery on 01/30/20 per patient. The patient states he is only having complications from his recent surgery. Patient voiced no concerns or questions regarding his blood sugars or blood pressure at this time.  [name], CPP Notified.  Raynelle Highland, [place] Pharmacist Assistant [phone]  I have reviewed the care management and care coordination activities outlined in this encounter and I am certifying that I agree with the content of this note. No further action required.  3 minutes spent in  review, coordination, and documentation.  Mayford Knife, [facility] 03/03/20 1:01 PM

## 2020-03-17 ENCOUNTER — Telehealth: Payer: Self-pay

## 2020-03-17 ENCOUNTER — Telehealth: Payer: Medicare HMO

## 2020-03-17 NOTE — Telephone Encounter (Signed)
  Chronic Care Management   Outreach Note  03/17/2020 Name: Gregory Friedman MRN: [id number] DOB: 1950/08/06  Referred by: [name], MD Reason for referral : Chronic Care Management (RN CM FU Call Attempt)   An unsuccessful telephone outreach was attempted today. The patient was referred to the case management team for assistance with care management and care coordination.   Follow Up Plan: Telephone follow up appointment with care management team member scheduled for: 04/15/20  Barb Merino, RN, BSN, CCM Care Management Coordinator [place] [facility]  Direct Phone: [phone]

## 2020-03-18 ENCOUNTER — Telehealth: Payer: Medicare HMO

## 2020-03-18 ENCOUNTER — Telehealth: Payer: Self-pay

## 2020-03-18 NOTE — Telephone Encounter (Signed)
  Chronic Care Management   Outreach Note  03/18/2020 Name: Harvis Mabus MRN: [id number] DOB: 06/27/1950  Referred by: [name], MD Reason for referral : Chronic Care Management (Inbound Call from patient )   Voice message received from patient stating he missed my call, he requested a call back. An unsuccessful telephone outreach was attempted today. Unable to leave a voice message for Mr. Gianino due to his voice mail box was not set up. The patient was referred to the case management team for assistance with care management and care coordination.   Follow Up Plan: Telephone follow up appointment with care management team member scheduled for: 04/15/20  Barb Merino, RN, BSN, CCM Care Management Coordinator [place] [facility]  Direct Phone: [phone]

## 2020-03-25 ENCOUNTER — Ambulatory Visit (INDEPENDENT_AMBULATORY_CARE_PROVIDER_SITE_OTHER): Payer: Medicare HMO

## 2020-03-25 ENCOUNTER — Telehealth: Payer: Self-pay

## 2020-03-25 DIAGNOSIS — I10 Essential (primary) hypertension: Secondary | ICD-10-CM

## 2020-03-25 DIAGNOSIS — E1165 Type 2 diabetes mellitus with hyperglycemia: Secondary | ICD-10-CM

## 2020-03-25 DIAGNOSIS — Z794 Long term (current) use of insulin: Secondary | ICD-10-CM | POA: Diagnosis not present

## 2020-03-25 DIAGNOSIS — E785 Hyperlipidemia, unspecified: Secondary | ICD-10-CM

## 2020-03-25 NOTE — Chronic Care Management (AMB) (Signed)
Chronic Care Management Pharmacy Assistant   Name: Gregory Friedman  MRN: [id number] DOB: April 05, 1950  Reason for Encounter: Diabetes Adherence Call   Patient Questions:  1.  Have you seen any other providers since your last visit? No    2.  Any changes in your medicines or health? No     PCP : [name], MD  Allergies:  No Known Allergies  Medications: Outpatient Encounter Medications as of 03/25/2020  Medication Sig Note  . Accu-Chek Softclix Lancets lancets USE AS DIRECTED   . aspirin EC 81 MG tablet Take 81 mg by mouth at bedtime. 02/05/2020: Wait to start to taking ASA 81 after surgery, will be seen on 02/23/19  . Coenzyme Q10 200 MG capsule Take 200 mg by mouth at bedtime.    . docusate sodium (COLACE) 100 MG capsule Take 1 capsule (100 mg total) by mouth daily as needed for up to 30 doses.   [name] enalapril (VASOTEC) 10 MG tablet Take 1 tablet (10 mg total) by mouth daily.   [name] glucose blood (ACCU-CHEK GUIDE) test strip 1 each by Other route 2 (two) times daily. Use as instructed   . insulin degludec (TRESIBA FLEXTOUCH) 100 UNIT/ML FlexTouch Pen Inject 0.4 mLs (40 Units total) into the skin daily. 55 unites per day (Patient taking differently: Inject 40 Units into the skin at bedtime.)   . Insulin Pen Needle 32G X 4 MM MISC 1 Device by Does not apply route in the morning, at noon, in the evening, and at bedtime.   [name] (OMEGA-3) 500 MG CAPS Take 1 capsule by mouth daily.   . meclizine (ANTIVERT) 25 MG tablet TAKE 1 TABLET THREE TIMES DAILY FOR DIZZINESS (Patient taking differently: Take 25 mg by mouth 3 (three) times daily as needed.)   . melatonin 1 MG TABS tablet Take 1 mg by mouth at bedtime.   . metoprolol succinate (TOPROL-XL) 50 MG 24 hr tablet TAKE 1 TABLET EVERY DAY (NEW DIRECTIONS) (Patient taking differently: Take 50 mg by mouth at bedtime.)   . Misc Natural Products (TART CHERRY ADVANCED PO) Take by mouth at bedtime.   . nitroGLYCERIN (NITROSTAT) 0.4 MG SL  tablet Place 1 tablet (0.4 mg total) under the tongue every 5 (five) minutes as needed. Chest pain 01/28/2020: Per pt last taken nitro 01/ 2020  . omeprazole (PRILOSEC) 40 MG capsule TAKE 1 CAPSULE EVERY DAY BEFORE A MEAL   . oxyCODONE-acetaminophen (PERCOCET) 5-325 MG tablet Take 1 tablet by mouth every 4 (four) hours as needed for up to 12 doses for severe pain.   . simvastatin (ZOCOR) 40 MG tablet TAKE 1 TABLET  EVERY EVENING.   [name] triamcinolone cream (KENALOG) 0.1 % Apply 1 application topically 3 (three) times daily. Apply to rash bid x 7-10 days, then prn when rash return    No facility-administered encounter medications on file as of 03/25/2020.    Current Diagnosis: Patient Active Problem List   Diagnosis Date Noted  . BPH (benign prostatic hyperplasia) 01/30/2020  . Low back pain 06/11/2019  . Type 2 diabetes mellitus with hyperglycemia, with long-term current use of insulin ([place]) 05/23/2019  . Type 2 diabetes mellitus with stage 3a chronic kidney disease, with long-term current use of insulin ([place]) 05/23/2019  . Dyslipidemia 05/23/2019  . Psoriasis 12/12/2018  . Skin lesion 10/31/2018  . Diabetes mellitus type 2, insulin dependent ([place]) 09/12/2018  . Elevated serum creatinine 09/12/2018  . Hypothyroidism 10/10/2017  . Atherosclerosis of aorta ([place])  10/10/2017  . Unspecified skin changes 10/10/2017  . Other long term (current) drug therapy 10/10/2017  . Abdominal pain 09/21/2017  . Nausea & vomiting 09/21/2017  . History of head and neck cancer 09/21/2017  . History of colonic polyps 04/04/2016  . Dysphagia 04/04/2016  . Bloating 04/04/2016  . Diarrhea 04/04/2016  . Trigger finger of right thumb 08/23/2015  . Chest pain 07/15/2015  . Pain in the chest   . S/P CABG (coronary artery bypass graft)   . Noncompliance with medications 07/08/2015  . Acute gout 04/19/2015  . GERD (gastroesophageal reflux disease) 05/14/2014  . Depression, major, in remission ([place]) 03/27/2013  .  Allergic rhinitis, cause unspecified 03/27/2013  . 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 Depressive disorder, not elsewhere classified 12/06/2011  . Nonspecific (abnormal) findings on radiological and other examination of gastrointestinal tract 02/10/2011  . Dysphagia, unspecified(787.20) 02/10/2011  . Palpitations 02/02/2011  . Dizziness 07/15/2010  . DIAB W/OPHTH MANIFESTS TYPE I [JUV TYPE] UNCNTRL 08/02/2009  . Mixed hyperlipidemia 08/02/2009  . Essential hypertension 08/02/2009  . CAD (coronary artery disease) 08/02/2009   Recent Relevant Labs: Lab Results  Component Value Date/Time   HGBA1C 8.4 (H) 01/01/2020 02:57 PM   HGBA1C 9.3 (H) 09/18/2019 04:26 PM   MICROALBUR 10 10/31/2018 10:57 AM   MICROALBUR 1.0 04/19/2015 12:01 AM   MICROALBUR 0.2 05/14/2014 12:01 AM    Kidney Function Lab Results  Component Value Date/Time   CREATININE 1.58 (H) 01/31/2020 12:11 AM   CREATININE 1.40 (H) 01/30/2020 11:53 AM   CREATININE 1.41 (H) 04/19/2015 12:01 AM   CREATININE 1.35 05/14/2014 12:01 AM   CREATININE 1.4 (H) 05/20/2013 10:58 AM   CREATININE 1.3 11/14/2012 09:19 AM   GFRNONAA 47 (L) 01/31/2020 12:11 AM   GFRAA 50 (L) 01/01/2020 02:57 PM    . Current antihyperglycemic regimen:  o Tresiba 40 units daily at 8 pm  . What recent interventions/DTPs have been made to improve glycemic control:  o Patient is back taking his medications following his prostate procedure.  . Have there been any recent hospitalizations or ED visits since last visit with CPP? No   . Patient denies hypoglycemic symptoms, including Pale, Shaky, Hungry, Nervous/irritable and Vision changes   . Patient denies hyperglycemic symptoms, including blurry vision, excessive thirst, fatigue, polyuria and weakness   . How often are you checking your blood sugar? Per patient, [name], CPP would like him to check after his heaviest meals to determine why A1c is spiking.   . What are your  blood sugars ranging?  o Fasting: 126 o Before meals: none o After meals: none o Bedtime: none  . During the week, how often does your blood glucose drop below 70? Never   . Are you checking your feet daily/regularly? Patient stated he is checking his feet daily and moisturizes.   Adherence Review: Is the patient currently on a STATIN medication? Yes  Is the patient currently on ACE/ARB medication? No Does the patient have >5 day gap between last estimated fill dates? No    Goals Addressed            This Visit's Progress   . Pharmacy Care Plan   On track    CARE PLAN ENTRY (see longitudinal plan of care for additional care plan information)  Current Barriers:  . Chronic Disease Management support, education, and care coordination needs related to Hypertension, Hyperlipidemia, and Diabetes   Hypertension BP Readings from Last 3 Encounters:  06/26/19  134/82  06/11/19 140/90  05/23/19 (!) 144/82   . Pharmacist Clinical Goal(s): o Over the next 180 days, patient will work with PharmD and providers to maintain BP goal <130/80 . Current regimen:  o Enalapril 10mg  daily o Metoprolol succinate 25mg  2 tablets daily . Interventions: o Provided dietary and exercise recommendations . Patient self care activities - Over the next 180 days, patient will: o Check BP daily, document, and provide at future appointments o Ensure daily salt intake < 2300 mg/day o Exercise for 30 minutes daily 5 times per week  Hyperlipidemia Lab Results  Component Value Date/Time   LDLCALC 57 04/17/2019 03:17 PM   . Pharmacist Clinical Goal(s): o Over the next 90 days, patient will work with PharmD and providers to maintain LDL goal < 70 . Current regimen:  o Simvastatin 40mg  daily . Interventions: o Provided dietary and exercise recommendations - Increase healthy fat intake, decrease fatty foods o Recommend recheck thyroid hormones at next PCP appointment . Patient self care activities -  Over the next 90 days, patient will: o Increase intake of healthy fats (Avocados, walnuts, flaxseed, etc) o Limit intake of fatty foods, fired, foods, and junk foods o Exercise at least 30 minutes 5 times a week  Diabetes Lab Results  Component Value Date/Time   HGBA1C 8.6 (H) 07/03/2019 04:22 PM   HGBA1C 9.4 (H) 03/27/2019 05:25 PM   . Pharmacist Clinical Goal(s): o Over the next 120 days, patient will work with PharmD and providers to achieve A1c goal <7% . Current regimen:  o Tresiba 40 units daily at 8 pm  . Interventions: o Provided dietary and exercise recommendations (mailed handouts to patient) o Discuss titration of Tresiba to 45 units daily and reassess in two weeks. o Assisted with obtaining refill for Tresiba from [employer] patient assistance program o Reviewed patient's most recent blood glucose readings . Patient self care activities - Over the next 60 days, patient will: o Check blood sugar once daily, document, and provide at future appointments o Contact provider with any episodes of hypoglycemia o Increase intake of lean protein o Try PLATE method for meal planning  Medication management . Pharmacist Clinical Goal(s): o Over the next 90 days, patient will work with PharmD and providers to maintain optimal medication adherence . Current pharmacy: [facility] . Interventions o Comprehensive medication review performed. o Continue current medication management strategy . Patient self care activities - Over the next 90 days, patient will: o Focus on medication adherence by consider use of a pill box to organize medications o Take medications as prescribed o Report any questions or concerns to PharmD and/or provider(s)  Please see past updates related to this goal by clicking on the "Past Updates" button in the selected goal         Follow-Up:  Pharmacist Review -Patient reports urologist removed the catheter on Mar 04, 2020 and states he is doing  good. Patient stated he is back on his diabetes medication per urologist orders.Patient also stated [name], CPP discussed some alternatives for his insulin from recent encounter 03/25/20.  Early morning hours around 5 AM, he reports heavy sweating around the neck that happens more often than usual and causes him not to speak. Patient voiced his pulse has been ranging in the 50s, his blood pressure has been reading 123/63, 130/54, 92/63, 112/63.137/63, 118/49.  Patient stated he is doing light activity following his procedure per doctors orders  Patient voiced he is expecting his [place]  to be delivered soon from the manufacturer, [name], CPP was able to assist him in tracking the shipment so he should be good. I advised the patient if he is needing any future assistance please feel free to contact me. The patient verbalized understanding.   [name], CPP Notified.  Raynelle Highland, [place] Pharmacist Assistant [phone] CCM Total Time: 53 minutes

## 2020-03-25 NOTE — Telephone Encounter (Signed)
I returned the pt's call.  He called about his medications that he gets from patient assistance program.

## 2020-03-25 NOTE — Progress Notes (Signed)
Chronic Care Management Pharmacy Note  03/30/2020 Name:  Gregory Friedman MRN:  [id number] DOB:  23-Jul-1950  Subjective: Gregory Friedman is an 70 y.o. year old male who is a primary patient of [name], MD.  The CCM team was consulted for assistance with disease management and care coordination needs.    Engaged with patient face to face for follow up visit in response to provider referral for pharmacy case management and/or care coordination services.   Consent to Services:  The patient was given information about Chronic Care Management services, agreed to services, and gave verbal consent prior to initiation of services.  Please see initial visit note for detailed documentation.   Patient Care Team: [name], MD as PCP - General (Internal Medicine) Fay Records, MD as PCP - Cardiology (Cardiology) Rozetta Nunnery, MD as Attending Physician (Otolaryngology) Eppie Gibson, MD as Attending Physician (Radiation Oncology) Fay Records, MD as Attending Physician (Cardiology) Inda Castle, MD (Inactive) as Attending Physician (Gastroenterology) Lynne Logan, RN as Case Manager Edythe Clarity, [facility] (Pharmacist) Mayford Knife, [facility] (Pharmacist)  Recent office visits:   Recent consult visits: 02/24/2020 Urinary r  01/30/2020 Transurethral Resection of the Prostate  Hospital visits:  01/30/2020  Objective:  Lab Results  Component Value Date   CREATININE 1.58 (H) 01/31/2020   BUN 16 01/31/2020   GFRNONAA 47 (L) 01/31/2020   GFRAA 50 (L) 01/01/2020   NA 137 01/31/2020   K 3.5 01/31/2020   CALCIUM 8.7 (L) 01/31/2020   CO2 28 01/31/2020    Lab Results  Component Value Date/Time   HGBA1C 8.4 (H) 01/01/2020 02:57 PM   HGBA1C 9.3 (H) 09/18/2019 04:26 PM   MICROALBUR 10 10/31/2018 10:57 AM   MICROALBUR 1.0 04/19/2015 12:01 AM   MICROALBUR 0.2 05/14/2014 12:01 AM    Last diabetic Eye exam:  Lab Results  Component Value Date/Time    HMDIABEYEEXA No Retinopathy 09/14/2015 12:00 AM    Last diabetic Foot exam: No results found for: HMDIABFOOTEX   Lab Results  Component Value Date   CHOL 152 09/18/2019   HDL 44 09/18/2019   LDLCALC 85 09/18/2019   TRIG 132 09/18/2019   CHOLHDL 3.5 09/18/2019    Hepatic Function Latest Ref Rng & Units 01/01/2020 09/18/2019 07/03/2019  Total Protein 6.0 - 8.5 g/dL 7.7 7.9 7.5  Albumin 3.8 - 4.8 g/dL 4.8 5.0(H) 4.1  AST 0 - 40 IU/L 20 20 19   ALT 0 - 44 IU/L 14 16 15   Alk Phosphatase 44 - 121 IU/L 75 61 57  Total Bilirubin 0.0 - 1.2 mg/dL 0.4 0.7 0.8  Bilirubin, Direct 0.0 - 0.3 mg/dL - - -    Lab Results  Component Value Date/Time   TSH 2.500 01/01/2020 02:57 PM   TSH 4.080 10/31/2018 12:15 PM   FREET4 1.13 10/31/2018 12:15 PM   FREET4 1.46 04/25/2018 12:18 PM    CBC Latest Ref Rng & Units 01/31/2020 01/30/2020 01/01/2020  WBC 4.0 - 10.5 K/uL 6.3 - 5.5  Hemoglobin 13.0 - 17.0 g/dL 11.9(L) 14.6 13.6  Hematocrit 39.0 - 52.0 % 36.5(L) 43.0 40.7  Platelets 150 - 400 K/uL 130(L) - 210    Lab Results  Component Value Date/Time   VD25OH 76.0 04/17/2019 03:17 PM    Clinical ASCVD: Yes  The 10-year ASCVD risk score Mikey Bussing DC Jr., et al., 2013) is: 39.3%   Values used to calculate the score:     Age: 32 years  Sex: Male     Is Non-Hispanic African American: No     Diabetic: Yes     Tobacco smoker: No     Systolic Blood Pressure: 518 mmHg     Is BP treated: Yes     HDL Cholesterol: 44 mg/dL     Total Cholesterol: 152 mg/dL    Depression screen [facility] 2/9 01/01/2020 09/18/2019 03/27/2019  Decreased Interest 0 0 0  Down, Depressed, Hopeless 0 0 0  PHQ - 2 Score 0 0 0  Altered sleeping 0 - -  Tired, decreased energy 0 - -  Change in appetite 0 - -  Feeling bad or failure about yourself  0 - -  Trouble concentrating 0 - -  Moving slowly or fidgety/restless 0 - -  Suicidal thoughts 0 - -  PHQ-9 Score 0 - -  Difficult doing work/chores Not difficult at all - -  Some recent data  might be hidden      Social History   Tobacco Use  Smoking Status Never Smoker  Smokeless Tobacco Never Used   BP Readings from Last 3 Encounters:  02/24/20 (!) 142/82  01/31/20 120/72  01/01/20 (!) 144/80   Pulse Readings from Last 3 Encounters:  02/24/20 66  01/31/20 (!) 55  01/01/20 (!) 59   Wt Readings from Last 3 Encounters:  01/30/20 193 lb 9.6 oz (87.8 kg)  01/01/20 197 lb 12.8 oz (89.7 kg)  12/31/19 199 lb (90.3 kg)    Assessment/Interventions: Review of patient past medical history, allergies, medications, health status, including review of consultants reports, laboratory and other test data, was performed as part of comprehensive evaluation and provision of chronic care management services.   SDOH:  (Social Determinants of Health) assessments and interventions performed: Yes   CCM Care Plan  No Known Allergies  Medications Reviewed Today    Reviewed by Mayford Knife, RPH (Pharmacist) on 03/25/20 at 1516  Med List Status: <None>  Medication Order Taking? Sig Documenting Provider Last Dose Status Informant  Accu-Chek Softclix Lancets lancets [id number] Yes USE AS DIRECTED [name], MD Taking Active   aspirin EC 81 MG tablet [id number] Yes Take 81 mg by mouth at bedtime. [provider] Taking Active            Med Note Pricilla Holm Feb 05, 2020  2:20 PM) Wait to start to taking ASA 81 after surgery, will be seen on 02/23/19  Coenzyme Q10 200 MG capsule [id number] Yes Take 200 mg by mouth at bedtime.  [provider] Taking Active Self  docusate sodium (COLACE) 100 MG capsule [id number] Yes Take 1 capsule (100 mg total) by mouth daily as needed for up to 30 doses. Janith Lima, MD Taking Active   enalapril (VASOTEC) 10 MG tablet [id number]  Take 1 tablet (10 mg total) by mouth daily. [name], MD  Active   glucose blood (ACCU-CHEK GUIDE) test strip [id number] Yes 1 each by Other route 2 (two) times daily. Use as instructed  Rodriguez-Southworth, Sunday Spillers, PA-C Taking Active Other  insulin degludec (TRESIBA FLEXTOUCH) 100 UNIT/ML FlexTouch Pen [id number] Yes Inject 0.4 mLs (40 Units total) into the skin daily. 55 unites per day  Patient taking differently: Inject 40 Units into the skin at bedtime.   Shamleffer, Melanie Crazier, MD Taking Active Self  Insulin Pen Needle 32G X 4 MM MISC [id number] Yes 1 Device by Does not apply route in the morning, at noon, in the evening, and at  bedtime. Shamleffer, Melanie Crazier, MD Taking Active   Krill Oil (OMEGA-3) 500 MG CAPS [id number]  Take 1 capsule by mouth daily. [provider]  Active   meclizine (ANTIVERT) 25 MG tablet [id number] Yes TAKE 1 TABLET THREE TIMES DAILY FOR DIZZINESS  Patient taking differently: Take 25 mg by mouth 3 (three) times daily as needed.   [name], MD Taking Active Self  melatonin 1 MG TABS tablet [id number] Yes Take 1 mg by mouth at bedtime. [provider] Taking Active   metoprolol succinate (TOPROL-XL) 50 MG 24 hr tablet [id number] Yes TAKE 1 TABLET EVERY DAY (NEW DIRECTIONS)  Patient taking differently: Take 50 mg by mouth at bedtime.   Fay Records, MD Taking Active Self  Misc Natural Products [facility] ADVANCED PO) [id number] Yes Take by mouth at bedtime. [provider] Taking Active   nitroGLYCERIN (NITROSTAT) 0.4 MG SL tablet [id number] Yes Place 1 tablet (0.4 mg total) under the tongue every 5 (five) minutes as needed. Chest pain Fay Records, MD Taking Active Self           Med Note Dyke Brackett Jan 28, 2020 12:08 PM) Per pt last taken nitro 01/ 2020  omeprazole (PRILOSEC) 40 MG capsule [id number] Yes TAKE 1 CAPSULE EVERY DAY BEFORE A MEAL [name], MD Taking Active   oxyCODONE-acetaminophen (PERCOCET) 5-325 MG tablet [id number] Yes Take 1 tablet by mouth every 4 (four) hours as needed for up to 12 doses for severe pain. Janith Lima, MD Taking Active   simvastatin (ZOCOR) 40 MG tablet  [id number] Yes TAKE 1 TABLET  EVERY EVENING. Minette Brine, FNP Taking Active   triamcinolone cream (KENALOG) 0.1 % [id number] Yes Apply 1 application topically 3 (three) times daily. Apply to rash bid x 7-10 days, then prn when rash return Rodriguez-Southworth, Sunday Spillers, [place] Taking Active Self          Patient Active Problem List   Diagnosis Date Noted  . BPH (benign prostatic hyperplasia) 01/30/2020  . Low back pain 06/11/2019  . Type 2 diabetes mellitus with hyperglycemia, with long-term current use of insulin ([place]) 05/23/2019  . Type 2 diabetes mellitus with stage 3a chronic kidney disease, with long-term current use of insulin ([place]) 05/23/2019  . Dyslipidemia 05/23/2019  . Psoriasis 12/12/2018  . Skin lesion 10/31/2018  . Diabetes mellitus type 2, insulin dependent ([place]) 09/12/2018  . Elevated serum creatinine 09/12/2018  . Hypothyroidism 10/10/2017  . Atherosclerosis of aorta ([place]) 10/10/2017  . Unspecified skin changes 10/10/2017  . Other long term (current) drug therapy 10/10/2017  . Abdominal pain 09/21/2017  . Nausea & vomiting 09/21/2017  . History of head and neck cancer 09/21/2017  . History of colonic polyps 04/04/2016  . Dysphagia 04/04/2016  . Bloating 04/04/2016  . Diarrhea 04/04/2016  . Trigger finger of right thumb 08/23/2015  . Chest pain 07/15/2015  . Pain in the chest   . S/P CABG (coronary artery bypass graft)   . Noncompliance with medications 07/08/2015  . Acute gout 04/19/2015  . GERD (gastroesophageal reflux disease) 05/14/2014  . Depression, major, in remission ([place]) 03/27/2013  . Allergic rhinitis, cause unspecified 03/27/2013  . 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 Depressive disorder, not elsewhere classified 12/06/2011  . Nonspecific (abnormal) findings on radiological and other examination of gastrointestinal tract 02/10/2011  . Dysphagia, unspecified(787.20) 02/10/2011  . Palpitations 02/02/2011  . Dizziness 07/15/2010  .  DIAB W/OPHTH MANIFESTS TYPE I [JUV TYPE] UNCNTRL  08/02/2009  . Mixed hyperlipidemia 08/02/2009  . Essential hypertension 08/02/2009  . CAD (coronary artery disease) 08/02/2009    Immunization History  Administered Date(s) Administered  . Influenza Split 11/24/2010, 12/06/2011  . Influenza, High Dose Seasonal PF 11/17/2014, 10/23/2017, 12/05/2018  . Influenza,inj,Quad PF,6+ Mos 11/25/2012, 01/01/2014  . Pneumococcal Conjugate-13 01/07/2015  . Pneumococcal Polysaccharide-23 10/21/2009  . Tdap 05/27/2009    Conditions to be addressed/monitored:  Hypertension, Hyperlipidemia and Diabetes  Care Plan : [place]  Updates made by Mayford Knife, RPH since 03/30/2020 12:00 AM    Problem: HTN, HLD, DM II   Priority: High    Long-Range Goal: Disease Management   This Visit's Progress: On track  Priority: High  Note:    Current Barriers:  . Unable to independently afford treatment regimen . Unable to achieve control of Diabetes    Pharmacist Clinical Goal(s):  [name] Over the next 90 days, patient will verbalize ability to afford treatment regimen . achieve adherence to monitoring guidelines and medication adherence to achieve therapeutic efficacy through collaboration with PharmD and provider.    Interventions: . 1:1 collaboration with [name], MD regarding development and update of comprehensive plan of care as evidenced by provider attestation and co-signature . Inter-disciplinary care team collaboration (see longitudinal plan of care) . Comprehensive medication review performed; medication list updated in electronic medical record  Hypertension (BP goal <130/80) -Uncontrolled -Current treatment: . Enalapril 10 mg tablet once per day  . Metoprolol Succinate 50 mg tablet daily  -Current home readings: 111/65  -Current dietary habits: patient is avoiding high salt foods  -Current exercise habits: walking more frequently  -Reports hypotensive/hypertensive  symptoms -Educated on BP goals and benefits of medications for prevention of heart attack, stroke and kidney damage; Daily salt intake goal < 2300 mg; Exercise goal of 150 minutes per week; Importance of home blood pressure monitoring; -Counseled to monitor BP at home at least once per day , document, and provide log at future appointments -Counseled on diet and exercise extensively Recommended to continue current medication   Hyperlipidemia: (LDL goal < 70) -Uncontrolled -Current treatment: . Aspirin 81 mg tablet daily . Simvastatin 40 mg tablet daily  -Current dietary patterns: patient reports eating a lot of vegetables and avoiding fried, fatty foods  -Current exercise habits: patient has started working more outside and doing things around the house . -Educated on Cholesterol goals;  Benefits of statin for ASCVD risk reduction; Importance of limiting foods high in cholesterol; -Counseled on diet and exercise extensively Recommended to continue current medication  Diabetes (A1c goal <7%) -Uncontrolled -Current medications: . Insulin Degludec 40 units into the skin in the morning -Medications previously tried: Jardiance, Metformin  -Current home glucose readings fasting glucose: 147-90 -Denies hypoglycemic/hyperglycemic symptoms -Current meal patterns:  . breakfast: eating an egg and bacon  . dinner: protein and vegetables  . drinks: mostly water  -Current exercise: patient reports that he is going to start walking more  -Educated onA1c and blood sugar goals; Complications of diabetes including kidney damage, retinal damage, and cardiovascular disease; Exercise goal of 150 minutes per week; Benefits of routine self-monitoring of blood sugar; Carbohydrate counting and/or plate method we -Counseled to check feet daily and get yearly eye exams -Counseled on diet and exercise extensively Recommended to continue current medication   Health Maintenance -Current therapy:   . Astrid Drafts- taking 1 capsule daily . Coenzyme Q10 taking 1 tablet daily  . Melatonin 1 mg tablet daily  -Educated on  Herbal supplement research is limited and benefits usually cannot be proven Supplements may interfere with prescription drugs -Patient is satisfied with current therapy and denies issues   Patient Goals/Self-Care Activities . Over the next 30 days, patient will:  - take medications as prescribed focus on medication adherence by taking his medication at the same time each day.   Follow Up Plan: Telephone follow up appointment with care management team member scheduled for:  05/04/2020      Medication Assistance: [name] obtained through [employer] medication assistance program.  Enrollment ends 12/23/2019  Patient's preferred pharmacy is:  [facility] [place] [facility] - [address] AT [address] [place] [address] Phone: [phone] Fax: [facility] - [place], [address] [place] [address] Phone: [phone] Fax: [phone]  Uses pill box? No - patient uses his regular pill bottles.  Pt endorses 90% compliance  We discussed: Benefits of medication synchronization, packaging and delivery as well as enhanced pharmacist oversight with [employer]. Patient decided to: Continue current medication management strategy  Care Plan and Follow Up Patient Decision:  Patient agrees to Care Plan and Follow-up.  Plan: Telephone follow up appointment with care management team member scheduled for:  05/04/2020  [name], PharmD Clinical Pharmacist [facility] [phone]

## 2020-03-30 NOTE — Patient Instructions (Signed)
Visit Information It was great speaking with you today!  Please let me know if you have any questions about our visit.  Goals Addressed            This Visit's Progress   . Manage My Medicine       Timeframe:  Long-Range Goal Priority:  High Start Date:                             Expected End Date:                       Follow Up Date: 05/04/2020   - call for medicine refill 2 or 3 days before it runs out - call if I am sick and can't take my medicine - use an alarm clock or phone to remind me to take my medicine    Why is this important?   . These steps will help you keep on track with your medicines.        Patient Care Plan: Chronic Kidney (Adult)    Problem Identified: Disease Progression   Priority: High    Long-Range Goal: Disease Progression Prevented or Minimized   Start Date: 01/06/2020  Expected End Date: 04/05/2020  This Visit's Progress: On track  Priority: High  Note:   Current Barriers:   Ineffective Self Health Maintenance  Currently UNABLE TO independently self manage needs related to chronic health conditions.   Knowledge Deficits related to short term plan for care coordination needs and long term plans for chronic disease management needs Nurse Case Manager Clinical Goal(s):   Over the next 90 days, patient will work with care management team to address care coordination and chronic disease management needs related to Disease Management  Educational Needs  Care Coordination  Medication Management and Education  Medication Assistance   Psychosocial Support   Interventions:    Discuss potential for disease progression based on clinical diagnosis and/or causation and other risk factors including race, ethnicity, age, comorbidities, lifestyle.   Encourage lifestyle changes including maintaining a healthy weight, exercise and activity, limiting alcohol consumption, smoking cessation to improve long-term outcomes.  Patient Goals/Self Care  Activities:  - call for medicine refill 2 or 3 days before it runs out - keep follow-up appointments - set alarm to remind to take medications  Follow Up Plan: Telephone follow up appointment with care management team member scheduled for: 02/11/20       Patient Care Plan: Wellness (Adult)    Problem Identified: Medication Adherence (Wellness)   Priority: High    Goal: Medication Adherence Maintained   Start Date: 01/06/2020  Expected End Date: 03/08/2020  This Visit's Progress: On track  Priority: High  Note:   Current Barriers:   Ineffective Self Health Maintenance  Currently UNABLE TO independently self manage needs related to chronic health conditions.   Knowledge Deficits related to short term plan for care coordination needs and long term plans for chronic disease management needs Nurse Case Manager Clinical Goal(s):   Over the next 90 days, patient will work with care management team to address care coordination and chronic disease management needs related to Disease Management  Educational Needs  Care Coordination  Medication Management and Education  Medication Assistance   Psychosocial Support   Interventions:   Review all medications to determine if patient or caregiver knows why the medications are given and if taken as prescribed.   Complete or  review a medication adherence assessment including barriers to medication adherence.   Arrange and encourage counseling and medication review by pharmacist.   Assess barriers to medication adherence.   Encourage the use of medication reminders such as clock or cell phone alarm, color coding, pillboxes for am/pm and days of the week, pharmacy refill reminder, auto-refill system or mail-order option.   Assess presence of side effects; provide suggestions to manage or reduce side effects.  Patient Goals/Self Care Activities:  Over the next 90 days, patient will:  -consult with embedded Pharm D to discuss cost  assistance for [name]  -patient will continue to take [place] as prescribed w/o missed doses -patient will ask PCP for [place] drug samples if needed       Follow Up Plan: Telephone follow up appointment with care management team member scheduled for: 02/11/20       Patient Care Plan: Diabetes Type 2 (Adult)    Problem Identified: Glycemic Management (Diabetes, Type 2)     Long-Range Goal: Glycemic Management Optimized   Start Date: 01/06/2020  Expected End Date: 04/05/2020  This Visit's Progress: On track  Priority: High  Note:   Objective:  Lab Results  Component Value Date   HGBA1C 8.4 (H) 01/01/2020 .   Lab Results  Component Value Date   CREATININE 1.59 (H) 01/01/2020   CREATININE 1.48 (H) 09/18/2019   CREATININE 1.54 (H) 07/03/2019 .   [name] No results found for: EGFR Current Barriers:  [name] Knowledge Deficits related to basic Diabetes pathophysiology and self care/management . Knowledge Deficits related to medications used for management of diabetes . Financial Constraints Case Manager Clinical Goal(s):  [name] Over the next 90 days, patient will demonstrate improved adherence to prescribed treatment plan for diabetes self care/management as evidenced by:  . daily monitoring and recording of CBG  . adherence to ADA/ carb modified diet . exercise 5 days/week . adherence to prescribed medication regimen Interventions:  . Provided education to patient about basic DM disease process . Reviewed medications with patient and discussed importance of medication adherence . Discussed plans with patient for ongoing care management follow up and provided patient with direct contact information for care management team . Advised patient, providing education and rationale, to check cbg before meals and bedtime, before and after exercise and record, calling CCM team and or PCP for findings outside established parameters.   . Review of patient status, including review of consultants reports,  relevant laboratory and other test results, and medications completed.  Compare self-reported symptoms of hypo or hyperglycemia to blood glucose levels, diet and fluid intake, current medications, psychosocial and physiologic stressors, change in activity and barriers to care adherence.   Promote self-monitoring of blood glucose levels.  Patient Goals/Self-Care Activities . Over the next 90 days, patient will:  - Self administers oral medications as prescribed Self administers injectable DM medication [name]) as prescribed Attends all scheduled provider appointments Checks blood sugars as prescribed and utilize hyper and hypoglycemia protocol as needed Adheres to prescribed ADA/carb modified - check blood sugar at prescribed times - check blood sugar before and after exercise - check blood sugar if I feel it is too high or too low - enter blood sugar readings and medication or insulin into daily log - take the blood sugar log to all doctor visits - take the blood sugar meter to all doctor visits  Follow Up Plan: Telephone follow up appointment with care management team member scheduled for: 02/11/20  Problem Identified: Disease Progression (Diabetes, Type 2)   Priority: High    Long-Range Goal: Disease Progression Prevented or Minimized   Start Date: 01/06/2020  Expected End Date: 04/05/2020  This Visit's Progress: On track  Priority: High  Note:   CARE PLAN ENTRY (see longtitudinal plan of care for additional care plan information)  Objective:  Lab Results  Component Value Date   HGBA1C 8.4 (H) 01/01/2020  Objective:  Lab Results  Component Value Date   HGBA1C 8.4 (H) 01/01/2020 .   Lab Results  Component Value Date   CREATININE 1.59 (H) 01/01/2020   CREATININE 1.48 (H) 09/18/2019   CREATININE 1.54 (H) 07/03/2019 .   [name] No results found for: EGFR Current Barriers:  [name] Knowledge Deficits related to basic Diabetes pathophysiology and self  care/management . Knowledge Deficits related to medications used for management of diabetes . Difficulty obtaining or cannot afford medications . Financial Constraints Case Manager Clinical Goal(s):  [name] Over the next 90 days, patient will demonstrate improved adherence to prescribed treatment plan for diabetes self care/management as evidenced by: taking [place] exactly as prescribed w/o missed doses Interventions:  . Provided education to patient about basic DM disease process . Reviewed medications with patient and discussed importance of medication adherence . Discussed plans with patient for ongoing care management follow up and provided patient with direct contact information for care management team . Referral made to pharmacy team for assistance with cost assistance for [place] . Review of patient status, including review of consultants reports, relevant laboratory and other test results, and medications completed. Patient Goals/Self-Care Activities . Over the next 90 days, patient will:  - Self administers oral medications as prescribed Self administers injectable DM medication [name]) as prescribed Attends all scheduled provider appointments - check blood sugar at prescribed times - check blood sugar before and after exercise - check blood sugar if I feel it is too high or too low - enter blood sugar readings and medication or insulin into daily log - take the blood sugar log to all doctor visits - take the blood sugar meter to all doctor visits  Follow Up Plan: Telephone follow up appointment with care management team member scheduled for: 02/11/20     Patient Care Plan: Urinary Incontinence (Adult)    Problem Identified: Disorder Identification (Urinary Incontinence)   Priority: High    Goal: Urinary Incontinence Identified   Start Date: 01/06/2020  Expected End Date: 02/06/2020  This Visit's Progress: On track  Priority: High  Note:   Current Barriers:    Ineffective Self Health Maintenance  Currently UNABLE TO independently self manage needs related to chronic health conditions.   Knowledge Deficits related to short term plan for care coordination needs and long term plans for chronic disease management needs Nurse Case Manager Clinical Goal(s):   Over the next 30 days, patient will work with care management team to address care coordination and chronic disease management needs related to Disease Management  Educational Needs  Care Coordination  Medication Management and Education  Medication Assistance   Psychosocial Support   Interventions:   Determine severity and nature of incontinence, such as stress, urge or mixed incontinence) to assist in definitive diagnosis.   Determined patient is scheduled to undergo a TURP per Dr. Abner Greenspan, Urologist on 1/7/3f  Determined patient is able to verbalize a good understanding about this procedure and what to expect post surgery  Determined patient understands he should stop his ASA 5 days prior to the procedure  per Dr. Abner Greenspan Patient Goals/Self Care Activities:  Over the next 30 days, patient will:  -undergo a transurethral resection of the prostate as scheduled, 01/30/20 -hold ASA 5 days prior to this procedure as directed by Urologist   Follow Up Plan: Telephone follow up appointment with care management team member scheduled for: 02/11/20       Patient Care Plan: CCM Pharmacy Care Plan    Problem Identified: HTN, HLD, DM II   Priority: High    Long-Range Goal: Disease Management   This Visit's Progress: On track  Priority: High  Note:    Current Barriers:  . Unable to independently afford treatment regimen . Unable to achieve control of Diabetes    Pharmacist Clinical Goal(s):  [name] Over the next 90 days, patient will verbalize ability to afford treatment regimen . achieve adherence to monitoring guidelines and medication adherence to achieve therapeutic efficacy through  collaboration with PharmD and provider.    Interventions: . 1:1 collaboration with [name], MD regarding development and update of comprehensive plan of care as evidenced by provider attestation and co-signature . Inter-disciplinary care team collaboration (see longitudinal plan of care) . Comprehensive medication review performed; medication list updated in electronic medical record  Hypertension (BP goal <130/80) -Controlled -Current treatment: . Enalapril 10 mg tablet once per day  . Metoprolol Succinate 50 mg tablet daily  -Current home readings: 111/65  -Current dietary habits: patient is avoiding high salt foods  -Current exercise habits: walking more frequently  -Reports hypotensive/hypertensive symptoms -Educated on BP goals and benefits of medications for prevention of heart attack, stroke and kidney damage; Daily salt intake goal < 2300 mg; Exercise goal of 150 minutes per week; Importance of home blood pressure monitoring; -Counseled to monitor BP at home at least once per day , document, and provide log at future appointments -Counseled on diet and exercise extensively Recommended to continue current medication   Hyperlipidemia: (LDL goal < 70) -Uncontrolled -Current treatment: . Aspirin 81 mg tablet daily . Simvastatin 40 mg tablet daily  -Current dietary patterns: patient reports eating a lot of vegetables and avoiding fried, fatty foods  -Current exercise habits: patient has started working more outside and doing things around the house . -Educated on Cholesterol goals;  Benefits of statin for ASCVD risk reduction; Importance of limiting foods high in cholesterol; -Counseled on diet and exercise extensively Recommended to continue current medication  Diabetes (A1c goal <7%) -Uncontrolled -Current medications: . Insulin Degludec 40 units into the skin in the morning -Medications previously tried: Jardiance, Metformin  -Current home glucose  readings fasting glucose: 147-90 -Denies hypoglycemic/hyperglycemic symptoms -Current meal patterns:  . breakfast: eating an egg and bacon  . dinner: protein and vegetables  . drinks: mostly water  -Current exercise: patient reports that he is going to start walking more  -Educated onA1c and blood sugar goals; Complications of diabetes including kidney damage, retinal damage, and cardiovascular disease; Exercise goal of 150 minutes per week; Benefits of routine self-monitoring of blood sugar; Carbohydrate counting and/or plate method we -Counseled to check feet daily and get yearly eye exams -Counseled on diet and exercise extensively Recommended to continue current medication   Health Maintenance -Current therapy:  . Astrid Drafts- taking 1 capsule daily . Coenzyme Q10 taking 1 tablet daily  . Melatonin 1 mg tablet daily  -Educated on Herbal supplement research is limited and benefits usually cannot be proven Supplements may interfere with prescription drugs -Patient is satisfied with current therapy and denies issues  Patient Goals/Self-Care Activities . Over the next 30 days, patient will:  - take medications as prescribed focus on medication adherence by taking his medication at the same time each day.   Follow Up Plan: Telephone follow up appointment with care management team member scheduled for:  05/04/2020      Patient agreed to services and verbal consent obtained.   The patient verbalized understanding of instructions, educational materials, and care plan provided today and agreed to receive a mailed copy of patient instructions, educational materials, and care plan.   [name], PharmD Clinical Pharmacist [facility] [phone]

## 2020-04-01 ENCOUNTER — Ambulatory Visit: Payer: Medicare HMO | Admitting: Internal Medicine

## 2020-04-01 ENCOUNTER — Ambulatory Visit (INDEPENDENT_AMBULATORY_CARE_PROVIDER_SITE_OTHER): Payer: Medicare HMO

## 2020-04-01 ENCOUNTER — Encounter: Payer: Self-pay | Admitting: Nurse Practitioner

## 2020-04-01 ENCOUNTER — Other Ambulatory Visit: Payer: Self-pay

## 2020-04-01 ENCOUNTER — Ambulatory Visit (INDEPENDENT_AMBULATORY_CARE_PROVIDER_SITE_OTHER): Payer: Medicare HMO | Admitting: Nurse Practitioner

## 2020-04-01 VITALS — Ht 66.8 in | Wt 192.0 lb

## 2020-04-01 VITALS — BP 128/70 | HR 51 | Temp 98.1°F | Ht 66.8 in | Wt 192.2 lb

## 2020-04-01 DIAGNOSIS — Z79899 Other long term (current) drug therapy: Secondary | ICD-10-CM | POA: Diagnosis not present

## 2020-04-01 DIAGNOSIS — Z794 Long term (current) use of insulin: Secondary | ICD-10-CM | POA: Diagnosis not present

## 2020-04-01 DIAGNOSIS — I10 Essential (primary) hypertension: Secondary | ICD-10-CM | POA: Diagnosis not present

## 2020-04-01 DIAGNOSIS — E785 Hyperlipidemia, unspecified: Secondary | ICD-10-CM | POA: Diagnosis not present

## 2020-04-01 DIAGNOSIS — Z0000 Encounter for general adult medical examination without abnormal findings: Secondary | ICD-10-CM | POA: Diagnosis not present

## 2020-04-01 DIAGNOSIS — E1165 Type 2 diabetes mellitus with hyperglycemia: Secondary | ICD-10-CM

## 2020-04-01 NOTE — Patient Instructions (Signed)
Gregory Friedman , Thank you for taking time to come for your Medicare Wellness Visit. I appreciate your ongoing commitment to your health goals. Please review the following plan we discussed and let me know if I can assist you in the future.   Screening recommendations/referrals: Colonoscopy: completed 05/30/2017, due 05/31/2022 Recommended yearly ophthalmology/optometry visit for glaucoma screening and checkup Recommended yearly dental visit for hygiene and checkup  Vaccinations: Influenza vaccine: completed per patient Pneumococcal vaccine: completed 01/07/2015 Tdap vaccine: due Shingles vaccine: discussed   Covid-19: decline  Advanced directives: Advance directive discussed with you today.   Conditions/risks identified: none  Next appointment: Follow up in one year for your annual wellness visit.   Preventive Care 38 Years and Older, Male Preventive care refers to lifestyle choices and visits with your health care provider that can promote health and wellness. What does preventive care include?  A yearly physical exam. This is also called an annual well check.  Dental exams once or twice a year.  Routine eye exams. Ask your health care provider how often you should have your eyes checked.  Personal lifestyle choices, including:  Daily care of your teeth and gums.  Regular physical activity.  Eating a healthy diet.  Avoiding tobacco and drug use.  Limiting alcohol use.  Practicing safe sex.  Taking low doses of aspirin every day.  Taking vitamin and mineral supplements as recommended by your health care provider. What happens during an annual well check? The services and screenings done by your health care provider during your annual well check will depend on your age, overall health, lifestyle risk factors, and family history of disease. Counseling  Your health care provider may ask you questions about your:  Alcohol use.  Tobacco use.  Drug use.  Emotional  well-being.  Home and relationship well-being.  Sexual activity.  Eating habits.  History of falls.  Memory and ability to understand (cognition).  Work and work Statistician. Screening  You may have the following tests or measurements:  Height, weight, and BMI.  Blood pressure.  Lipid and cholesterol levels. These may be checked every 5 years, or more frequently if you are over 41 years old.  Skin check.  Lung cancer screening. You may have this screening every year starting at age 44 if you have a 30-pack-year history of smoking and currently smoke or have quit within the past 15 years.  Fecal occult blood test (FOBT) of the stool. You may have this test every year starting at age 8.  Flexible sigmoidoscopy or colonoscopy. You may have a sigmoidoscopy every 5 years or a colonoscopy every 10 years starting at age 19.  Prostate cancer screening. Recommendations will vary depending on your family history and other risks.  Hepatitis C blood test.  Hepatitis B blood test.  Sexually transmitted disease (STD) testing.  Diabetes screening. This is done by checking your blood sugar (glucose) after you have not eaten for a while (fasting). You may have this done every 1-3 years.  Abdominal aortic aneurysm (AAA) screening. You may need this if you are a current or former smoker.  Osteoporosis. You may be screened starting at age 57 if you are at high risk. Talk with your health care provider about your test results, treatment options, and if necessary, the need for more tests. Vaccines  Your health care provider may recommend certain vaccines, such as:  Influenza vaccine. This is recommended every year.  Tetanus, diphtheria, and acellular pertussis (Tdap, Td) vaccine. You may need  a Td booster every 10 years.  Zoster vaccine. You may need this after age 53.  Pneumococcal 13-valent conjugate (PCV13) vaccine. One dose is recommended after age 68.  Pneumococcal  polysaccharide (PPSV23) vaccine. One dose is recommended after age 20. Talk to your health care provider about which screenings and vaccines you need and how often you need them. This information is not intended to replace advice given to you by your health care provider. Make sure you discuss any questions you have with your health care provider. Document Released: 02/05/2015 Document Revised: 09/29/2015 Document Reviewed: 11/10/2014 Elsevier Interactive Patient Education  2017 [employer] Prevention in the Home Falls can cause injuries. They can happen to people of all ages. There are many things you can do to make your home safe and to help prevent falls. What can I do on the outside of my home?  Regularly fix the edges of walkways and driveways and fix any cracks.  Remove anything that might make you trip as you walk through a door, such as a raised step or threshold.  Trim any bushes or trees on the path to your home.  Use bright outdoor lighting.  Clear any walking paths of anything that might make someone trip, such as rocks or tools.  Regularly check to see if handrails are loose or broken. Make sure that both sides of any steps have handrails.  Any raised decks and porches should have guardrails on the edges.  Have any leaves, snow, or ice cleared regularly.  Use sand or salt on walking paths during winter.  Clean up any spills in your garage right away. This includes oil or grease spills. What can I do in the bathroom?  Use night lights.  Install grab bars by the toilet and in the tub and shower. Do not use towel bars as grab bars.  Use non-skid mats or decals in the tub or shower.  If you need to sit down in the shower, use a plastic, non-slip stool.  Keep the floor dry. Clean up any water that spills on the floor as soon as it happens.  Remove soap buildup in the tub or shower regularly.  Attach bath mats securely with double-sided non-slip rug  tape.  Do not have throw rugs and other things on the floor that can make you trip. What can I do in the bedroom?  Use night lights.  Make sure that you have a light by your bed that is easy to reach.  Do not use any sheets or blankets that are too big for your bed. They should not hang down onto the floor.  Have a firm chair that has side arms. You can use this for support while you get dressed.  Do not have throw rugs and other things on the floor that can make you trip. What can I do in the kitchen?  Clean up any spills right away.  Avoid walking on wet floors.  Keep items that you use a lot in easy-to-reach places.  If you need to reach something above you, use a strong step stool that has a grab bar.  Keep electrical cords out of the way.  Do not use floor polish or wax that makes floors slippery. If you must use wax, use non-skid floor wax.  Do not have throw rugs and other things on the floor that can make you trip. What can I do with my stairs?  Do not leave any items on the  stairs.  Make sure that there are handrails on both sides of the stairs and use them. Fix handrails that are broken or loose. Make sure that handrails are as long as the stairways.  Check any carpeting to make sure that it is firmly attached to the stairs. Fix any carpet that is loose or worn.  Avoid having throw rugs at the top or bottom of the stairs. If you do have throw rugs, attach them to the floor with carpet tape.  Make sure that you have a light switch at the top of the stairs and the bottom of the stairs. If you do not have them, ask someone to add them for you. What else can I do to help prevent falls?  Wear shoes that:  Do not have high heels.  Have rubber bottoms.  Are comfortable and fit you well.  Are closed at the toe. Do not wear sandals.  If you use a stepladder:  Make sure that it is fully opened. Do not climb a closed stepladder.  Make sure that both sides of the  stepladder are locked into place.  Ask someone to hold it for you, if possible.  Clearly mark and make sure that you can see:  Any grab bars or handrails.  First and last steps.  Where the edge of each step is.  Use tools that help you move around (mobility aids) if they are needed. These include:  Canes.  Walkers.  Scooters.  Crutches.  Turn on the lights when you go into a dark area. Replace any light bulbs as soon as they burn out.  Set up your furniture so you have a clear path. Avoid moving your furniture around.  If any of your floors are uneven, fix them.  If there are any pets around you, be aware of where they are.  Review your medicines with your doctor. Some medicines can make you feel dizzy. This can increase your chance of falling. Ask your doctor what other things that you can do to help prevent falls. This information is not intended to replace advice given to you by your health care provider. Make sure you discuss any questions you have with your health care provider. Document Released: 11/05/2008 Document Revised: 06/17/2015 Document Reviewed: 02/13/2014 Elsevier Interactive Patient Education  2017 [employer].

## 2020-04-01 NOTE — Progress Notes (Signed)
Rutherford Nail as a Education administrator for [employer], NP.,have documented all relevant documentation on the behalf of [employer], NP,as directed by  Bary Castilla, NP while in the presence of Bary Castilla, NP. This visit occurred during the SARS-CoV-2 public health emergency.  Safety protocols were in place, including screening questions prior to the visit, additional usage of staff PPE, and extensive cleaning of exam room while observing appropriate contact time as indicated for disinfecting solutions.  Subjective:     Patient ID: Gregory Friedman , male    DOB: Feb 23, 1950 , 70 y.o.   MRN: [id number]   Chief Complaint  Patient presents with  . Diabetes  . Hypertension    HPI  Pt presents today for a follow up on diabetes. He has been off his medication and recently just started back on it due to his prostate procedure.  His urologist had advised him to discontinue his meds so they don't interfere with this procedure. He has not done a lot of exercise but he is going to start going back on his bike. He is eating a lot of veggies and healthy things. He is trying to eat healthy. Otherwise he is doing well.   Diabetes He presents for his follow-up diabetic visit. He has type 2 diabetes mellitus. There are no hypoglycemic associated symptoms. Pertinent negatives for hypoglycemia include no dizziness, headaches or tremors. There are no diabetic associated symptoms. Pertinent negatives for diabetes include no fatigue, no polydipsia, no polyphagia, no polyuria and no weakness. There are no hypoglycemic complications. There are no diabetic complications. Risk factors for coronary artery disease include obesity, male sex and hypertension. Current diabetic treatment includes oral agent (monotherapy). He is compliant with treatment all of the time. He is following a generally healthy diet. When asked about meal planning, he reported none. He has not had a previous visit with a  dietitian. He participates in exercise every other day. There is no change in his home blood glucose trend. An ACE inhibitor/angiotensin II receptor blocker is being taken. He sees a podiatrist.Eye exam is current (May 2021).     Past Medical History:  Diagnosis Date  . Anxiety   . Arthritis   . Benign localized prostatic hyperplasia with lower urinary tract symptoms (LUTS)   . Bradycardia    chronic, asymptomatic per cardiology note  . 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 CKD (chronic kidney disease), stage III ([place])   . Coronary artery disease cardiologist--- dr Mamie Nick. [facility]   remote hx MI;  s/p  PCI and stenting in 2003 x2 to LAD ;  s/p CABG x2 in 2004 (LIMA --LAD,  SVG--OM);  s/p  PCI stenting x2 to LAD/ OM;  cardiac cath 05-25-2010;  stress test 07-15-2015 no ischemia, ef 45-50%, intermediate risk  . Decreased range of motion of neck    s/p  radical neck dissection for cancer in 2014  . Depression   . Diverticulosis   . GERD (gastroesophageal reflux disease)   . History of colon polyps   . History of concussion 01/2018   per pt had vertigo fell and hit head, no loc, no residual  . History of esophageal stricture 02/16/2011   s/p  dilatation  . History of gout    toe  . History of MI (myocardial infarction) 2003   s/p  PCI and stenting  . 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 Hypertension    followed by cardiology and pcp  . Hypothyroidism, unspecified    followed by pcp---  per pt never been on medication  . Limitation of opening of mouth    s/p  radical neck dissection due to cancer in 2014,  (01-28-2020  pt can get 2 finger's, but not 3)  . Limited joint range of motion     limited mouth opening and neck ROM s/p radical neck dissection  . Mixed hyperlipidemia   . PONV (postoperative nausea and vomiting)   . Psoriasis   . S/P CABG x 2 2004  . S/P drug eluting coronary stent placement    2003  x2 to LAD;  2006 post cabg , des x2 to LAD/ OM  . S/P radiation therapy    Received 8 fractions, then stopped due to mucositis with Radiation/ Cisplatin  . Type 2 diabetes mellitus treated with insulin ([place])    followed by pcp----  (01-28-2020 per pt checks blood sugar daily in am,  fasting sugar--- 100-130)  . Vertigo, intermittent      Family History  Problem Relation Age of Onset  . Stroke Sister   . Diabetes Brother   . Seizures Mother   . Asthma Father   . Thyroid cancer Daughter   . Diabetes Sister   . Parkinson's disease Brother   . Heart disease Brother   . Diabetes Brother   . Colon cancer Neg Hx      Current Outpatient Medications:  .  aspirin EC 81 MG tablet, Take 81 mg by mouth at bedtime., Disp: , Rfl:  .  Accu-Chek Softclix Lancets lancets, USE AS DIRECTED, Disp: 300 each, Rfl: 1 .  Coenzyme Q10 200 MG capsule, Take 200 mg by mouth at bedtime. , Disp: , Rfl:  .  docusate sodium (COLACE) 100 MG capsule, Take 1 capsule (100 mg total) by mouth daily as needed for up to 30 doses., Disp: 30 capsule, Rfl: 0 .  enalapril (VASOTEC) 10 MG tablet, Take 1 tablet (10 mg total) by mouth daily., Disp: 90 tablet, Rfl: 1 .  glucose blood (ACCU-CHEK GUIDE) test strip, 1 each by Other route 2 (two) times daily. Use as instructed, Disp: 200 each, Rfl: 12 .  insulin degludec (TRESIBA FLEXTOUCH) 100 UNIT/ML FlexTouch Pen, Inject 0.4 mLs (40 Units total) into the skin daily. 55 unites per day (Patient taking differently: Inject 40 Units into the skin at bedtime.), Disp: 30 mL, Rfl: 3 .  Insulin Pen Needle 32G X 4 MM MISC, 1 Device by Does not apply route in the morning, at noon, in the evening, and at bedtime., Disp: 400 each, Rfl: 3 .  Krill Oil (OMEGA-3) 500 MG CAPS,  Take 1 capsule by mouth daily., Disp: , Rfl:  .  meclizine (ANTIVERT) 25 MG tablet, TAKE 1 TABLET THREE TIMES DAILY FOR DIZZINESS (Patient taking differently: Take 25 mg by mouth 3 (three) times daily as needed.), Disp: 270 tablet, Rfl: 0 .  melatonin 1 MG TABS tablet, Take 1 mg by mouth at bedtime., Disp: , Rfl:  .  metoprolol succinate (TOPROL-XL) 50 MG 24 hr tablet, TAKE 1 TABLET EVERY DAY (NEW DIRECTIONS) (Patient taking differently: Take 50 mg by mouth at bedtime.), Disp: 90 tablet, Rfl: 1 .  Misc Natural Products (TART CHERRY ADVANCED PO), Take by mouth at bedtime., Disp: , Rfl:  .  nitroGLYCERIN (NITROSTAT) 0.4 MG SL tablet, Place 1 tablet (0.4 mg total) under the tongue every 5 (five) minutes as needed. Chest pain, Disp: 25 tablet, Rfl: 3 .  omeprazole (PRILOSEC) 40 MG capsule, TAKE 1 CAPSULE EVERY DAY BEFORE A MEAL, Disp: 90 capsule, Rfl: 0 .  oxyCODONE-acetaminophen (PERCOCET) 5-325 MG tablet, Take 1 tablet by mouth every 4 (four) hours as needed for up to 12 doses for severe pain., Disp: 12 tablet, Rfl: 0 .  simvastatin (ZOCOR) 40 MG tablet, TAKE 1 TABLET  EVERY EVENING., Disp: 90 tablet, Rfl: 1 .  triamcinolone cream (KENALOG) 0.1 %, Apply 1 application topically 3 (three) times daily. Apply to rash bid x 7-10 days, then prn when rash return, Disp: 80 g, Rfl: 1   No Known Allergies   Review of Systems  Constitutional: Negative.  Negative for fatigue and fever.  HENT: Negative.   Respiratory: Negative for cough, shortness of breath and wheezing.   Endocrine: Negative for polydipsia, polyphagia and polyuria.  Musculoskeletal: Negative.   Skin: Negative.   Neurological: Negative for dizziness, tremors, weakness, numbness and headaches.  Psychiatric/Behavioral: Negative.      Today's Vitals   04/01/20 1200  BP: 128/70  Pulse: (!) 51  Temp: 98.1 F (36.7 C)  TempSrc: Oral  Weight: 192 lb 3.2 oz (87.2 kg)  Height: 5' 6.8" (1.697 m)   Body mass index is 30.28 kg/m.    Objective:  Physical Exam Constitutional:      Appearance: Normal appearance.  HENT:     Head: Normocephalic and atraumatic.  Cardiovascular:     Rate and Rhythm: Regular rhythm.     Pulses: Normal pulses.     Heart sounds: Normal heart sounds.  Pulmonary:     Effort: Pulmonary effort is normal. No respiratory distress.     Breath sounds: Normal breath sounds. No wheezing.  Skin:    Capillary Refill: Capillary refill takes less than 2 seconds.  Neurological:     Mental Status: He is alert and oriented to person, place, and time.  Psychiatric:        Mood and Affect: Mood normal.        Behavior: Behavior normal.        Thought Content: Thought content normal.        Judgment: Judgment normal.         Assessment And Plan:     1. Type 2 diabetes mellitus with hyperglycemia, with long-term current use of insulin (HCC) -Chronic, stable  -Continue med, will check lab today and assess  - CMP14+EGFR - Hemoglobin A1c -Educated patient about eating a diabetic diet which includes less of sugary foods and drinks    2. Essential hypertension -Chronic, stable  -Continue meds - CMP14+EGFR -Educated patient about the importance of eating a diet free of sodium and processed foods.     3. Dyslipidemia - Lipid panel -Educated patient about the importance of a diet low in fatty foods and drinks. Increase intake of fish and fiber.    4. Other long term (current) drug therapy -Will assess labs and evaluate  - CBC no Diff    Follow up in 3 months.     Patient was given opportunity to ask questions. Patient verbalized understanding of the plan and was able to repeat key elements of the plan. All questions were answered to their satisfaction.  Bary Castilla, NP  I, Bary Castilla, NP, have reviewed all documentation for this visit. The documentation on 04/01/20 for the exam, diagnosis, procedures, and orders are all accurate and complete.  THE PATIENT IS ENCOURAGED TO  PRACTICE SOCIAL DISTANCING DUE TO THE COVID-19 PANDEMIC.

## 2020-04-01 NOTE — Progress Notes (Signed)
I connected with Gregory Friedman today by telephone and verified that I am speaking with the correct person using two identifiers. Location patient: home Location provider: work Persons participating in the virtual visit: Asad, Keeven LPN.   I discussed the limitations, risks, security and privacy concerns of performing an evaluation and management service by telephone and the availability of in person appointments. I also discussed with the patient that there may be a patient responsible charge related to this service. The patient expressed understanding and verbally consented to this telephonic visit.    Interactive audio and video telecommunications were attempted between this provider and patient, however failed, due to patient having technical difficulties OR patient did not have access to video capability.  We continued and completed visit with audio only.     Vital signs may be patient reported or missing.  Subjective:   Gregory Friedman is a 70 y.o. male who presents for Medicare Annual/Subsequent preventive examination.  Review of Systems     Cardiac Risk Factors include: advanced age (>33men, >42 women);diabetes mellitus;dyslipidemia;hypertension;male gender;obesity (BMI >30kg/m2)     Objective:    Today's Vitals   04/01/20 1526  Weight: 192 lb (87.1 kg)  Height: 5' 6.8" (1.697 m)   Body mass index is 30.25 kg/m.  Advanced Directives 04/01/2020 01/30/2020 09/18/2019 02/10/2019 09/12/2018 09/21/2017 09/21/2017  Does Patient Have a Medical Advance Directive? No No No No No No -  Type of Advance Directive - - - - - - -  Does patient want to make changes to medical advance directive? - - - - - - -  Copy of [place] in Chart? - - - - - - -  Would patient like information on creating a medical advance directive? - No - Patient declined No - Patient declined No - Patient declined - Yes (ED - Information included in AVS) Yes (ED -  Information included in AVS)  Pre-existing out of facility DNR order (yellow form or pink MOST form) - - - - - - -    Current Medications (verified) Outpatient Encounter Medications as of 04/01/2020  Medication Sig  . Accu-Chek Softclix Lancets lancets USE AS DIRECTED  . aspirin EC 81 MG tablet Take 81 mg by mouth at bedtime.  . Coenzyme Q10 200 MG capsule Take 200 mg by mouth at bedtime.   . docusate sodium (COLACE) 100 MG capsule Take 1 capsule (100 mg total) by mouth daily as needed for up to 30 doses.  [name] enalapril (VASOTEC) 10 MG tablet Take 1 tablet (10 mg total) by mouth daily.  [name] glucose blood (ACCU-CHEK GUIDE) test strip 1 each by Other route 2 (two) times daily. Use as instructed  . insulin degludec (TRESIBA FLEXTOUCH) 100 UNIT/ML FlexTouch Pen Inject 0.4 mLs (40 Units total) into the skin daily. 55 unites per day (Patient taking differently: Inject 40 Units into the skin at bedtime.)  . Insulin Pen Needle 32G X 4 MM MISC 1 Device by Does not apply route in the morning, at noon, in the evening, and at bedtime.  [name] (OMEGA-3) 500 MG CAPS Take 1 capsule by mouth daily.  . meclizine (ANTIVERT) 25 MG tablet TAKE 1 TABLET THREE TIMES DAILY FOR DIZZINESS (Patient taking differently: Take 25 mg by mouth 3 (three) times daily as needed.)  . melatonin 1 MG TABS tablet Take 1 mg by mouth at bedtime.  . metoprolol succinate (TOPROL-XL) 50 MG 24 hr tablet TAKE 1 TABLET EVERY  DAY (NEW DIRECTIONS) (Patient taking differently: Take 50 mg by mouth at bedtime.)  . Misc Natural Products (TART CHERRY ADVANCED PO) Take by mouth at bedtime.  . nitroGLYCERIN (NITROSTAT) 0.4 MG SL tablet Place 1 tablet (0.4 mg total) under the tongue every 5 (five) minutes as needed. Chest pain  . omeprazole (PRILOSEC) 40 MG capsule TAKE 1 CAPSULE EVERY DAY BEFORE A MEAL  . oxyCODONE-acetaminophen (PERCOCET) 5-325 MG tablet Take 1 tablet by mouth every 4 (four) hours as needed for up to 12 doses for severe pain.  .  simvastatin (ZOCOR) 40 MG tablet TAKE 1 TABLET  EVERY EVENING.  [name] triamcinolone cream (KENALOG) 0.1 % Apply 1 application topically 3 (three) times daily. Apply to rash bid x 7-10 days, then prn when rash return   No facility-administered encounter medications on file as of 04/01/2020.    Allergies (verified) Patient has no known allergies.   History: Past Medical History:  Diagnosis Date  . Anxiety   . Arthritis   . Benign localized prostatic hyperplasia with lower urinary tract symptoms (LUTS)   . Bradycardia    chronic, asymptomatic per cardiology note  . 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 CKD (chronic kidney disease), stage III ([place])   . Coronary artery disease cardiologist--- dr Mamie Nick. [facility]   remote hx MI;  s/p  PCI and stenting in 2003 x2 to LAD ;  s/p CABG x2 in 2004 (LIMA --LAD,  SVG--OM);  s/p  PCI stenting x2 to LAD/ OM;  cardiac cath 05-25-2010;  stress test 07-15-2015 no ischemia, ef 45-50%, intermediate risk  . Decreased range of motion of neck    s/p  radical neck dissection for cancer in 2014  . Depression   . Diverticulosis   . GERD (gastroesophageal reflux disease)   . History of colon polyps   . History of concussion 01/2018   per pt had vertigo fell and hit head, no loc, no residual  . History of esophageal stricture 02/16/2011   s/p  dilatation  . History of gout    toe  . History of MI (myocardial infarction) 2003   s/p  PCI and stenting  . 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 Hypertension    followed by cardiology and pcp  . Hypothyroidism, unspecified    followed by pcp---  per pt never been on medication  . Limitation of opening of mouth    s/p   radical neck dissection due to cancer in 2014,  (01-28-2020  pt can get 2 finger's, but not 3)  . 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 Mixed hyperlipidemia   . PONV (postoperative nausea and vomiting)   . Psoriasis   . S/P CABG x 2 2004  . S/P drug eluting coronary stent placement    2003  x2 to LAD;  2006 post cabg , des x2 to LAD/ OM  . S/P radiation therapy    Received 8 fractions, then stopped due to mucositis with Radiation/ Cisplatin  . Type 2 diabetes mellitus treated with insulin ([place])    followed by pcp----  (  01-28-2020 per pt checks blood sugar daily in am,  fasting sugar--- 100-130)  . Vertigo, intermittent    Past Surgical History:  Procedure Laterality Date  . CARDIAC CATHETERIZATION  05-25-2010  dr Lizbeth Bark   LIMA-- LAD atretiz,  SVG--OM occluded, multiple areas which could be cause chest pain or ischemia (severe disease distal LCx or moderate disease Diagonal)  . COLONOSCOPY WITH PROPOFOL  last one 05-30-2017  dr Carlean Purl  . CORONARY ANGIOPLASTY WITH STENT PLACEMENT  2003   PCI and stent x2 to LAD  . CORONARY ANGIOPLASTY WITH STENT PLACEMENT  10/ 2006  dr Lizbeth Bark   PCI and DES x2  to LAD and OM  . CORONARY ARTERY BYPASS GRAFT  2004  in [place]   LIMA --- LAD,  SVG -- OM  . CYSTOSCOPY  02/09/2011   Procedure: CYSTOSCOPY;  Surgeon: Marissa Nestle, MD;  Location: [facility];  Service: Urology;  Laterality: N/A;  . ESOPHAGOGASTRODUODENOSCOPY  02-16-2011  dr Carlean Purl  . ESOPHAGOGASTRODUODENOSCOPY (EGD) WITH ESOPHAGEAL DILATION  02/16/2011   with Propofol  . FOOT ARTHRODESIS, SUBTALAR Right 10/13/2009  . MASS EXCISION N/A 03/13/2012   Procedure: EXCISION LEFT BUCCAL MUCOSA;  Surgeon: Rozetta Nunnery, MD;  Location: [facility];  Service: ENT;  Laterality: N/A;  . RADICAL NECK DISSECTION Left 03/13/2012   Procedure: RADICAL NECK DISSECTION;  Surgeon: Rozetta Nunnery, MD;  Location: [facility];  Service: ENT;   Laterality: Left;  . TOOTH EXTRACTION N/A 03/13/2012   Procedure: EXTRACTION MOLARS;  Surgeon: Rozetta Nunnery, MD;  Location: [facility];  Service: ENT;  Laterality: N/A;  . TRANSURETHRAL RESECTION OF PROSTATE N/A 01/30/2020   Procedure: TRANSURETHRAL RESECTION OF THE PROSTATE (TURP);  Surgeon: Janith Lima, MD;  Location[facility];  Service: Urology;  Laterality: N/A;  . TRIGGER FINGER RELEASE Right 02/24/2016   Procedure: RELEASE TRIGGER FINGER/A-1 PULLEY RIGHT THUMB;  Surgeon: Daryll Brod, MD;  Location: [facility];  Service: Orthopedics;  Laterality: Right;  FAB   Family History  Problem Relation Age of Onset  . Stroke Sister   . Diabetes Brother   . Seizures Mother   . Asthma Father   . Thyroid cancer Daughter   . Diabetes Sister   . Parkinson's disease Brother   . Heart disease Brother   . Diabetes Brother   . Colon cancer Neg Hx    Social History   Socioeconomic History  . Marital status: Married    Spouse name: Not on file  . Number of children: 3  . Years of education: Not on file  . Highest education level: Not on file  Occupational History  . 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 Smoking status: Never Smoker  . Smokeless tobacco: Never Used  Vaping Use  . Vaping Use: Never used  Substance and Sexual Activity  . Alcohol use: No  . Drug use: Never  . Sexual activity: Not on file  Other Topics Concern  . Not on file  Social History Narrative   Married;  2 children in [place], son in [place]; 6 grandchildren      Social Determinants of Health   Financial Resource Strain: Low Risk   . Difficulty of Paying Living Expenses: Not hard at all  Food Insecurity: No Food Insecurity  . Worried About Charity fundraiser in the Last Year: Never true  . Ran Out of Food  in the Last Year: Never true  Transportation Needs: No Transportation Needs  . Lack of Transportation (Medical): No  . Lack of  Transportation (Non-Medical): No  Physical Activity: Insufficiently Active  . Days of Exercise per Week: 7 days  . Minutes of Exercise per Session: 20 min  Stress: No Stress Concern Present  . Feeling of Stress : Not at all  Social Connections: Not on file    Tobacco Counseling Counseling given: Not Answered   Clinical Intake:  Pre-visit preparation completed: Yes  Pain : No/denies pain     Nutritional Status: BMI > 30  Obese Nutritional Risks: None Diabetes: Yes  How often do you need to have someone help you when you read instructions, pamphlets, or other written materials from your doctor or pharmacy?: 1 - Never What is the last grade level you completed in school?: 12th grade  Diabetic? Yes Nutrition Risk Assessment:  Has the patient had any N/V/D within the last 2 months?  No  Does the patient have any non-healing wounds?  No  Has the patient had any unintentional weight loss or weight gain?  No   Diabetes:  Is the patient diabetic?  Yes  If diabetic, was a CBG obtained today?  No  Did the patient bring in their glucometer from home?  No  How often do you monitor your CBG's? daily.   Financial Strains and Diabetes Management:  Are you having any financial strains with the device, your supplies or your medication? No .  Does the patient want to be seen by Chronic Care Management for management of their diabetes?  No  Would the patient like to be referred to a Nutritionist or for Diabetic Management?  No   Diabetic Exams:  Diabetic Eye Exam: Completed 06/10/2019 Diabetic Foot Exam: Completed 01/01/2020   Interpreter Needed?: No  Information entered by :: NAllen LPN   Activities of Daily Living In your present state of health, do you have any difficulty performing the following activities: 04/01/2020 01/30/2020  Hearing? Y Y  Comment getting hearing checked next month left ear due to a past surgery  Vision? N N  Difficulty concentrating or making  decisions? N N  Walking or climbing stairs? N N  Dressing or bathing? N N  Doing errands, shopping? N -  Preparing Food and eating ? N -  Using the Toilet? N -  In the past six months, have you accidently leaked urine? N -  Do you have problems with loss of bowel control? N -  Managing your Medications? N -  Managing your Finances? N -  Housekeeping or managing your Housekeeping? N -  Some recent data might be hidden    Patient Care Team: Minette Brine, [place] as PCP - General ([place]) Fay Records, MD as PCP - Cardiology (Cardiology) Rozetta Nunnery, MD as Attending Physician (Otolaryngology) Eppie Gibson, MD as Attending Physician (Radiation Oncology) Fay Records, MD as Attending Physician (Cardiology) Inda Castle, MD (Inactive) as Attending Physician (Gastroenterology) Lynne Logan, RN as Case Manager Edythe Clarity, [facility] (Pharmacist) Mayford Knife, [facility] (Pharmacist)  Indicate any recent Medical Services you may have received from other than [facility] providers in the past year (date may be approximate).     Assessment:   This is a routine wellness examination for [place].  Hearing/Vision screen No exam data present  Dietary issues and exercise activities discussed: Current Exercise Habits: Home exercise routine, Type of exercise: Other - see comments (  stationary bike), Time (Minutes): 20, Frequency (Times/Week): 7, Weekly Exercise (Minutes/Week): 140  Goals    . Follow My Treatment Plan-Chronic Kidney     Timeframe:  Long-Range Goal Priority:  High Start Date: 01/06/20                            Expected End Date:  04/05/20                    Follow Up Date 02/11/20   - call for medicine refill 2 or 3 days before it runs out - keep follow-up appointments - set alarm to remind to take medications    Why is this important?    Staying as healthy as you can is very important. This may mean making changes if you smoke, don't exercise or eat  poorly.   A healthy lifestyle is an important goal for you.   Following the treatment plan and making changes may be hard.   Try some of these steps to help keep the disease from getting worse.     Notes:     [name] Manage My Medicine     Timeframe:  Long-Range Goal Priority:  High Start Date:                             Expected End Date:                       Follow Up Date: 05/04/2020   - call for medicine refill 2 or 3 days before it runs out - call if I am sick and can't take my medicine - use an alarm clock or phone to remind me to take my medicine    Why is this important?   . These steps will help you keep on track with your medicines.     . Monitor and Manage My Blood Sugar-Diabetes Type 2     Timeframe:  Long-Range Goal Priority:  High Start Date: 01/06/20                            Expected End Date: 04/05/20                      Follow Up Date 02/11/20    - check blood sugar at prescribed times - check blood sugar before and after exercise - check blood sugar if I feel it is too high or too low - enter blood sugar readings and medication or insulin into daily log - take the blood sugar log to all doctor visits - take the blood sugar meter to all doctor visits    Why is this important?    Checking your blood sugar at home helps to keep it from getting very high or very low.   Writing the results in a diary or log helps the doctor know how to care for you.   Your blood sugar log should have the time, date and the results.   Also, write down the amount of insulin or other medicine that you take.   Other information, like what you ate, exercise done and how you were feeling, will also be helpful.     Notes:     . Patient Stated     09/12/2018, no goals    . Patient Stated  09/18/2019, wants to get rid of injections    . Patient Stated     04/01/2020, no goals    . Pharmacy Care Plan     CARE PLAN ENTRY (see longitudinal plan of care for additional  care plan information)  Current Barriers:  . Chronic Disease Management support, education, and care coordination needs related to Hypertension, Hyperlipidemia, and Diabetes   Hypertension BP Readings from Last 3 Encounters:  06/26/19 134/82  06/11/19 140/90  05/23/19 (!) 144/82   . Pharmacist Clinical Goal(s): o Over the next 180 days, patient will work with PharmD and providers to maintain BP goal <130/80 . Current regimen:  o Enalapril 10mg  daily o Metoprolol succinate 25mg  2 tablets daily . Interventions: o Provided dietary and exercise recommendations . Patient self care activities - Over the next 180 days, patient will: o Check BP daily, document, and provide at future appointments o Ensure daily salt intake < 2300 mg/day o Exercise for 30 minutes daily 5 times per week  Hyperlipidemia Lab Results  Component Value Date/Time   LDLCALC 57 04/17/2019 03:17 PM   . Pharmacist Clinical Goal(s): o Over the next 90 days, patient will work with PharmD and providers to maintain LDL goal < 70 . Current regimen:  o Simvastatin 40mg  daily . Interventions: o Provided dietary and exercise recommendations - Increase healthy fat intake, decrease fatty foods o Recommend recheck thyroid hormones at next PCP appointment . Patient self care activities - Over the next 90 days, patient will: o Increase intake of healthy fats (Avocados, walnuts, flaxseed, etc) o Limit intake of fatty foods, fired, foods, and junk foods o Exercise at least 30 minutes 5 times a week  Diabetes Lab Results  Component Value Date/Time   HGBA1C 8.6 (H) 07/03/2019 04:22 PM   HGBA1C 9.4 (H) 03/27/2019 05:25 PM   . Pharmacist Clinical Goal(s): o Over the next 120 days, patient will work with PharmD and providers to achieve A1c goal <7% . Current regimen:  o Tresiba 40 units daily at 8 pm  . Interventions: o Provided dietary and exercise recommendations (mailed handouts to patient) o Discuss titration of  Tresiba to 45 units daily and reassess in two weeks. o Assisted with obtaining refill for Tresiba from [employer] patient assistance program o Reviewed patient's most recent blood glucose readings . Patient self care activities - Over the next 60 days, patient will: o Check blood sugar once daily, document, and provide at future appointments o Contact provider with any episodes of hypoglycemia o Increase intake of lean protein o Try PLATE method for meal planning  Medication management . Pharmacist Clinical Goal(s): o Over the next 90 days, patient will work with PharmD and providers to maintain optimal medication adherence . Current pharmacy: [facility] . Interventions o Comprehensive medication review performed. o Continue current medication management strategy . Patient self care activities - Over the next 90 days, patient will: o Focus on medication adherence by consider use of a pill box to organize medications o Take medications as prescribed o Report any questions or concerns to PharmD and/or provider(s)  Please see past updates related to this goal by clicking on the "Past Updates" button in the selected goal      . TRANSURETHRAL RESECTION OF THE PROSTATE (TURP)     Timeframe:  Short-Term Goal Priority:  High Start Date: 01/06/20  Expected End Date: 02/06/20                     Follow up call: 02/11/20  Over the next 30 days, patient will:  -undergo a transurethral resection of the prostate as scheduled, 01/30/20 -hold ASA 5 days prior to this procedure as directed by Urologist      . Work with embedded Pharm D for drug cost assistance     Timeframe:  Short-Term Goal Priority:  High Start Date: 01/06/20                            Expected End Date:  03/08/20  Follow up date: 02/11/20  Over the next 90 days, patient will:  -consult with embedded Pharm D to discuss cost assistance for [name]  -patient will continue to take [place] as  prescribed w/o missed doses -patient will ask PCP for Tresiba drug samples if needed                           Depression Screen PHQ 2/9 Scores 04/01/2020 04/01/2020 04/01/2020 01/01/2020 09/18/2019 03/27/2019 02/17/2019  PHQ - 2 Score 0 0 0 0 0 0 0  PHQ- 9 Score - 0 - 0 - - 0    Fall Risk Fall Risk  04/01/2020 09/18/2019 03/27/2019 02/17/2019 12/05/2018  Falls in the past year? 1 0 1 1 0  Comment vertigo - - - -  Number falls in past yr: 0 - 0 0 -  Injury with Fall? 1 - 1 1 -  Comment hurt head - - the pt tripped over his shoes and fell -  Risk for fall due to : Medication side effect Medication side effect - - -  Follow up Falls evaluation completed;Education provided Falls evaluation completed;Education provided;Falls prevention discussed - - -    FALL RISK PREVENTION PERTAINING TO THE HOME:  Any stairs in or around the home? Yes  If so, are there any without handrails? No  Home free of loose throw rugs in walkways, pet beds, electrical cords, etc? Yes  Adequate lighting in your home to reduce risk of falls? Yes   ASSISTIVE DEVICES UTILIZED TO PREVENT FALLS:  Life alert? No  Use of a cane, walker or w/c? No  Grab bars in the bathroom? No  Shower chair or bench in shower? Yes  Elevated toilet seat or a handicapped toilet? No   TIMED UP AND GO:  Was the test performed? No .     Cognitive Function: MMSE - Mini Mental State Exam 10/23/2017  Orientation to time 5  Orientation to Place 5  Registration 3  Attention/ Calculation 5  Recall 3  Language- name 2 objects 2  Language- repeat 1  Language- follow 3 step command 3  Language- read & follow direction 1  Write a sentence 1  Copy design 1  Total score 30     6CIT Screen 04/01/2020 09/18/2019 09/12/2018  What Year? 0 points 0 points 0 points  What month? 0 points 0 points 0 points  What time? 0 points 0 points 0 points  Count back from 20 0 points 0 points 0 points  Months in reverse 4 points 0 points -  Repeat phrase 4  points 2 points 0 points  Total Score 8 2 -    Immunizations Immunization History  Administered Date(s) Administered  . Influenza Split 11/24/2010, 12/06/2011  .  Influenza, High Dose Seasonal PF 11/17/2014, 10/23/2017, 12/05/2018  . Influenza,inj,Quad PF,6+ Mos 11/25/2012, 01/01/2014  . Pneumococcal Conjugate-13 01/07/2015  . Pneumococcal Polysaccharide-23 10/21/2009  . Tdap 05/27/2009    TDAP status: Due, Education has been provided regarding the importance of this vaccine. Advised may receive this vaccine at local pharmacy or Health Dept. Aware to provide a copy of the vaccination record if obtained from local pharmacy or Health Dept. Verbalized acceptance and understanding.  Flu Vaccine status: Up to date  Pneumococcal vaccine status: Up to date  Covid-19 vaccine status: Declined, Education has been provided regarding the importance of this vaccine but patient still declined. Advised may receive this vaccine at local pharmacy or Health Dept.or vaccine clinic. Aware to provide a copy of the vaccination record if obtained from local pharmacy or Health Dept. Verbalized acceptance and understanding.  Qualifies for Shingles Vaccine? Yes   Zostavax completed No   Shingrix Completed?: No.    Education has been provided regarding the importance of this vaccine. Patient has been advised to call insurance company to determine out of pocket expense if they have not yet received this vaccine. Advised may also receive vaccine at local pharmacy or Health Dept. Verbalized acceptance and understanding.  Screening Tests Health Maintenance  Topic Date Due  . COVID-19 Vaccine (1) Never done  . PNA vac Low Risk Adult (2 of 2 - PPSV23) 01/07/2016  . INFLUENZA VACCINE  08/24/2019  . TETANUS/TDAP  09/17/2020 (Originally 05/28/2019)  . OPHTHALMOLOGY EXAM  06/09/2020  . HEMOGLOBIN A1C  07/01/2020  . FOOT EXAM  12/31/2020  . COLONOSCOPY (Pts 45-29yrs Insurance coverage will need to be confirmed)   05/31/2022  . Hepatitis C Screening  Completed  . HPV VACCINES  Aged Out    Health Maintenance  Health Maintenance Due  Topic Date Due  . COVID-19 Vaccine (1) Never done  . PNA vac Low Risk Adult (2 of 2 - PPSV23) 01/07/2016  . INFLUENZA VACCINE  08/24/2019    Colorectal cancer screening: Type of screening: Colonoscopy. Completed 05/30/2017. Repeat every 5 years  Lung Cancer Screening: (Low Dose CT Chest recommended if Age 9-80 years, 30 pack-year currently smoking OR have quit w/in 15years.) does not qualify.   Lung Cancer Screening Referral: no  Additional Screening:  Hepatitis C Screening: does qualify; Completed 04/19/2015  Vision Screening: Recommended annual ophthalmology exams for early detection of glaucoma and other disorders of the eye. Is the patient up to date with their annual eye exam?  Yes  Who is the provider or what is the name of the office in which the patient attends annual eye exams? [facility] If pt is not established with a provider, would they like to be referred to a provider to establish care? No .   Dental Screening: Recommended annual dental exams for proper oral hygiene  Community Resource Referral / Chronic Care Management: CRR required this visit?  No   CCM required this visit?  No      Plan:     I have personally reviewed and noted the following in the patient's chart:   . Medical and social history . Use of alcohol, tobacco or illicit drugs  . Current medications and supplements . Functional ability and status . Nutritional status . Physical activity . Advanced directives . List of other physicians . Hospitalizations, surgeries, and ER visits in previous 12 months . Vitals . Screenings to include cognitive, depression, and falls . Referrals and appointments  In addition, I have reviewed  and discussed with patient certain preventive protocols, quality metrics, and best practice recommendations. A written personalized  care plan for preventive services as well as general preventive health recommendations were provided to patient.     Kellie Simmering, LPN   09/18/784   Nurse Notes:

## 2020-04-01 NOTE — Patient Instructions (Signed)
Diabetes Mellitus and Exercise Exercising regularly is important for overall health, especially for people who have diabetes mellitus. Exercising is not only about losing weight. It has many other health benefits, such as increasing muscle strength and bone density and reducing body fat and stress. This leads to improved fitness, flexibility, and endurance, all of which result in better overall health. What are the benefits of exercise if I have diabetes? Exercise has many benefits for people with diabetes. They include:  Helping to lower and control blood sugar (glucose).  Helping the body to respond better to the hormone insulin by improving insulin sensitivity.  Reducing how much insulin the body needs.  Lowering the risk for heart disease by: ? Lowering "bad" cholesterol and triglyceride levels. ? Increasing "good" cholesterol levels. ? Lowering blood pressure. ? Lowering blood glucose levels. What is my activity plan? Your health care provider or certified diabetes educator can help you make a plan for the type and frequency of exercise that works for you. This is called your activity plan. Be sure to:  Get at least 150 minutes of medium-intensity or high-intensity exercise each week. Exercises may include brisk walking, biking, or water aerobics.  Do stretching and strengthening exercises, such as yoga or weight lifting, at least 2 times a week.  Spread out your activity over at least 3 days of the week.  Get some form of physical activity each day. ? Do not go more than 2 days in a row without some kind of physical activity. ? Avoid being inactive for more than 90 minutes at a time. Take frequent breaks to walk or stretch.  Choose exercises or activities that you enjoy. Set realistic goals.  Start slowly and gradually increase your exercise intensity over time.   How do I manage my diabetes during exercise? Monitor your blood glucose  Check your blood glucose before and  after exercising. If your blood glucose is: ? 240 mg/dL (13.3 mmol/L) or higher before you exercise, check your urine for ketones. These are chemicals created by the liver. If you have ketones in your urine, do not exercise until your blood glucose returns to normal. ? 100 mg/dL (5.6 mmol/L) or lower, eat a snack containing 15-20 grams of carbohydrate. Check your blood glucose 15 minutes after the snack to make sure that your glucose level is above 100 mg/dL (5.6 mmol/L) before you start your exercise.  Know the symptoms of low blood glucose (hypoglycemia) and how to treat it. Your risk for hypoglycemia increases during and after exercise. Follow these tips and your health care provider's instructions  Keep a carbohydrate snack that is fast-acting for use before, during, and after exercise to help prevent or treat hypoglycemia.  Avoid injecting insulin into areas of the body that are going to be exercised. For example, avoid injecting insulin into: ? Your arms, when you are about to play tennis. ? Your legs, when you are about to go jogging.  Keep records of your exercise habits. Doing this can help you and your health care provider adjust your diabetes management plan as needed. Write down: ? Food that you eat before and after you exercise. ? Blood glucose levels before and after you exercise. ? The type and amount of exercise you have done.  Work with your health care provider when you start a new exercise or activity. He or she may need to: ? Make sure that the activity is safe for you. ? Adjust your insulin, other medicines, and food that   you eat.  Drink plenty of water while you exercise. This prevents loss of water (dehydration) and problems caused by a lot of heat in the body (heat stroke).   Where to find more information  American Diabetes Association: www.diabetes.org Summary  Exercising regularly is important for overall health, especially for people who have diabetes  mellitus.  Exercising has many health benefits. It increases muscle strength and bone density and reduces body fat and stress. It also lowers and controls blood glucose.  Your health care provider or certified diabetes educator can help you make an activity plan for the type and frequency of exercise that works for you.  Work with your health care provider to make sure any new activity is safe for you. Also work with your health care provider to adjust your insulin, other medicines, and the food you eat. This information is not intended to replace advice given to you by your health care provider. Make sure you discuss any questions you have with your health care provider. Document Revised: 10/07/2018 Document Reviewed: 10/07/2018 Elsevier Patient Education  2021 Elsevier Inc.  

## 2020-04-02 LAB — CMP14+EGFR
ALT: 12 IU/L (ref 0–44)
AST: 13 IU/L (ref 0–40)
Albumin/Globulin Ratio: 1.5 (ref 1.2–2.2)
Albumin: 4.8 g/dL (ref 3.8–4.8)
Alkaline Phosphatase: 88 IU/L (ref 44–121)
BUN/Creatinine Ratio: 9 — ABNORMAL LOW (ref 10–24)
BUN: 12 mg/dL (ref 8–27)
Bilirubin Total: 0.4 mg/dL (ref 0.0–1.2)
CO2: 24 mmol/L (ref 20–29)
Calcium: 9.7 mg/dL (ref 8.6–10.2)
Chloride: 99 mmol/L (ref 96–106)
Creatinine, Ser: 1.41 mg/dL — ABNORMAL HIGH (ref 0.76–1.27)
Globulin, Total: 3.2 g/dL (ref 1.5–4.5)
Glucose: 226 mg/dL — ABNORMAL HIGH (ref 65–99)
Potassium: 4.2 mmol/L (ref 3.5–5.2)
Sodium: 140 mmol/L (ref 134–144)
Total Protein: 8 g/dL (ref 6.0–8.5)
eGFR: 54 mL/min/{1.73_m2} — ABNORMAL LOW (ref >59)

## 2020-04-02 LAB — CBC
Hematocrit: 41.3 % (ref 37.5–51.0)
Hemoglobin: 13.2 g/dL (ref 13.0–17.7)
MCH: 26.2 pg — ABNORMAL LOW (ref 26.6–33.0)
MCHC: 32 g/dL (ref 31.5–35.7)
MCV: 82 fL (ref 79–97)
Platelets: 200 10*3/uL (ref 150–450)
RBC: 5.04 x10E6/uL (ref 4.14–5.80)
RDW: 12.4 % (ref 11.6–15.4)
WBC: 5.7 10*3/uL (ref 3.4–10.8)

## 2020-04-02 LAB — HEMOGLOBIN A1C
Est. average glucose Bld gHb Est-mCnc: 212 mg/dL
Hgb A1c MFr Bld: 9 % — ABNORMAL HIGH (ref 4.8–5.6)

## 2020-04-02 LAB — LIPID PANEL
Chol/HDL Ratio: 3.6 ratio (ref 0.0–5.0)
Cholesterol, Total: 167 mg/dL (ref 100–199)
HDL: 46 mg/dL (ref >39)
LDL Chol Calc (NIH): 90 mg/dL (ref 0–99)
Triglycerides: 179 mg/dL — ABNORMAL HIGH (ref 0–149)
VLDL Cholesterol Cal: 31 mg/dL (ref 5–40)

## 2020-04-06 ENCOUNTER — Encounter: Payer: Self-pay | Admitting: Nurse Practitioner

## 2020-04-08 ENCOUNTER — Telehealth: Payer: Self-pay

## 2020-04-08 NOTE — Telephone Encounter (Signed)
Left pt a message, samples from patient assistance are ready for pickup. At his earliest convenience to come by the office to pick them up

## 2020-04-15 ENCOUNTER — Telehealth: Payer: Medicare HMO

## 2020-04-23 ENCOUNTER — Other Ambulatory Visit: Payer: Self-pay | Admitting: Internal Medicine

## 2020-04-26 ENCOUNTER — Other Ambulatory Visit: Payer: Self-pay | Admitting: Internal Medicine

## 2020-04-26 ENCOUNTER — Other Ambulatory Visit: Payer: Self-pay

## 2020-04-26 DIAGNOSIS — I10 Essential (primary) hypertension: Secondary | ICD-10-CM

## 2020-04-26 DIAGNOSIS — E119 Type 2 diabetes mellitus without complications: Secondary | ICD-10-CM

## 2020-04-26 MED ORDER — BD SWAB SINGLE USE REGULAR PADS: MEDICATED_PAD | 0 refills | Status: DC

## 2020-04-26 MED ORDER — ACCU-CHEK GUIDE VI STRP: 1.0000 | ORAL_STRIP | Freq: Two times a day (BID) | 12 refills | Status: DC

## 2020-04-27 ENCOUNTER — Telehealth: Payer: Medicare HMO

## 2020-04-28 DIAGNOSIS — H903 Sensorineural hearing loss, bilateral: Secondary | ICD-10-CM | POA: Diagnosis not present

## 2020-04-28 DIAGNOSIS — H9313 Tinnitus, bilateral: Secondary | ICD-10-CM | POA: Diagnosis not present

## 2020-05-03 ENCOUNTER — Telehealth: Payer: Self-pay

## 2020-05-03 NOTE — Progress Notes (Signed)
05/03/20- Patient reminded of CCM Call appointment on 05/04/20 at 10:00 AM with [name], CPP. Patient voiced understanding. Patient informed to have his medications and supplements near during phone visit.   [name], CPP Notified.  Raynelle Highland, [place] Pharmacist Assistant [phone] CCM Total Time: 5 minutes

## 2020-05-04 ENCOUNTER — Other Ambulatory Visit: Payer: Self-pay

## 2020-05-04 ENCOUNTER — Other Ambulatory Visit: Payer: Self-pay | Admitting: Nurse Practitioner

## 2020-05-04 ENCOUNTER — Ambulatory Visit (INDEPENDENT_AMBULATORY_CARE_PROVIDER_SITE_OTHER): Payer: Medicare HMO

## 2020-05-04 DIAGNOSIS — E782 Mixed hyperlipidemia: Secondary | ICD-10-CM | POA: Diagnosis not present

## 2020-05-04 DIAGNOSIS — I10 Essential (primary) hypertension: Secondary | ICD-10-CM

## 2020-05-04 DIAGNOSIS — E1165 Type 2 diabetes mellitus with hyperglycemia: Secondary | ICD-10-CM

## 2020-05-04 DIAGNOSIS — E119 Type 2 diabetes mellitus without complications: Secondary | ICD-10-CM

## 2020-05-04 DIAGNOSIS — Z794 Long term (current) use of insulin: Secondary | ICD-10-CM | POA: Diagnosis not present

## 2020-05-04 MED ORDER — ACCU-CHEK GUIDE VI STRP: 1.0000 | ORAL_STRIP | Freq: Two times a day (BID) | 12 refills | Status: DC

## 2020-05-04 NOTE — Progress Notes (Signed)
Chronic Care Management Pharmacy Note  05/10/2020 Name:  [name] MRN:  [id number] DOB:  [birth year]  Subjective: Gregory Friedman is an 70 y.o. year old male who is a primary patient of Gregory Friedman, [place].  The CCM team was consulted for assistance with disease management and care coordination needs.  Patient reports that he is doing well.   Engaged with patient by telephone for follow up visit in response to provider referral for pharmacy case management and/or care coordination services.   Consent to Services:  The patient was given information about Chronic Care Management services, agreed to services, and gave verbal consent prior to initiation of services.  Please see initial visit note for detailed documentation.   Patient Care Team: Gregory Friedman, Gregory Friedman as PCP - General ([place]) Fay Records, MD as PCP - Cardiology (Cardiology) Rozetta Nunnery, MD as Attending Physician (Otolaryngology) Gregory Gibson, MD as Attending Physician (Radiation Oncology) Fay Records, MD as Attending Physician (Cardiology) Inda Castle, MD (Inactive) as Attending Physician (Gastroenterology) Lynne Logan, RN as Case Manager Edythe Clarity, [facility] (Pharmacist) Mayford Knife, [facility] (Pharmacist)  Recent office visits: 04/01/2020   Recent ED Visit: 02/24/2020 Urinary Retention   Objective:  Lab Results  Component Value Date   CREATININE 1.41 (H) 04/01/2020   BUN 12 04/01/2020   GFRNONAA 47 (L) 01/31/2020   GFRAA 50 (L) 01/01/2020   NA 140 04/01/2020   K 4.2 04/01/2020   CALCIUM 9.7 04/01/2020   CO2 24 04/01/2020   GLUCOSE 226 (H) 04/01/2020    Lab Results  Component Value Date/Time   HGBA1C 9.0 (H) 04/01/2020 12:40 PM   HGBA1C 8.4 (H) 01/01/2020 02:57 PM   MICROALBUR 10 10/31/2018 10:57 AM   MICROALBUR 1.0 04/19/2015 12:01 AM   MICROALBUR 0.2 05/14/2014 12:01 AM    Last diabetic Eye exam:  Lab Results  Component Value Date/Time   HMDIABEYEEXA No  Retinopathy 09/14/2015 12:00 AM    Last diabetic Foot exam: No results found for: HMDIABFOOTEX   Lab Results  Component Value Date   CHOL 167 04/01/2020   HDL 46 04/01/2020   LDLCALC 90 04/01/2020   TRIG 179 (H) 04/01/2020   CHOLHDL 3.6 04/01/2020    Hepatic Function Latest Ref Rng & Units 04/01/2020 01/01/2020 09/18/2019  Total Protein 6.0 - 8.5 g/dL 8.0 7.7 7.9  Albumin 3.8 - 4.8 g/dL 4.8 4.8 5.0(H)  AST 0 - 40 IU/L 13 20 20   ALT 0 - 44 IU/L 12 14 16   Alk Phosphatase 44 - 121 IU/L 88 75 61  Total Bilirubin 0.0 - 1.2 mg/dL 0.4 0.4 0.7  Bilirubin, Direct 0.0 - 0.3 mg/dL - - -    Lab Results  Component Value Date/Time   TSH 2.500 01/01/2020 02:57 PM   TSH 4.080 10/31/2018 12:15 PM   FREET4 1.13 10/31/2018 12:15 PM   FREET4 1.46 04/25/2018 12:18 PM    CBC Latest Ref Rng & Units 04/01/2020 01/31/2020 01/30/2020  WBC 3.4 - 10.8 x10E3/uL 5.7 6.3 -  Hemoglobin 13.0 - 17.7 g/dL 13.2 11.9(L) 14.6  Hematocrit 37.5 - 51.0 % 41.3 36.5(L) 43.0  Platelets 150 - 450 x10E3/uL 200 130(L) -    Lab Results  Component Value Date/Time   VD25OH 76.0 04/17/2019 03:17 PM    Clinical ASCVD: No  The 10-year ASCVD risk score Mikey Bussing DC Jr., et al., 2013) is: 42.5%   Values used to calculate the score:     Age: 35 years  Sex: Male     Is Non-Hispanic African American: No     Diabetic: Yes     Tobacco smoker: No     Systolic Blood Pressure: 048 mmHg     Is BP treated: Yes     HDL Cholesterol: 46 mg/dL     Total Cholesterol: 167 mg/dL    Depression screen [facility] 2/9 04/01/2020 04/01/2020 04/01/2020  Decreased Interest 0 0 0  Down, Depressed, Hopeless 0 0 0  PHQ - 2 Score 0 0 0  Altered sleeping - 0 -  Tired, decreased energy - 0 -  Change in appetite - 0 -  Feeling bad or failure about yourself  - 0 -  Trouble concentrating - 0 -  Moving slowly or fidgety/restless - 0 -  Suicidal thoughts - 0 -  PHQ-9 Score - 0 -  Difficult doing work/chores - - -  Some recent data might be hidden       Social History   Tobacco Use  Smoking Status Never Smoker  Smokeless Tobacco Never Used   BP Readings from Last 3 Encounters:  04/01/20 128/70  02/24/20 (!) 142/82  01/31/20 120/72   Pulse Readings from Last 3 Encounters:  04/01/20 (!) 51  02/24/20 66  01/31/20 (!) 55   Wt Readings from Last 3 Encounters:  04/01/20 192 lb (87.1 kg)  04/01/20 192 lb 3.2 oz (87.2 kg)  01/30/20 193 lb 9.6 oz (87.8 kg)   BMI Readings from Last 3 Encounters:  04/01/20 30.25 kg/m  04/01/20 30.28 kg/m  01/30/20 29.44 kg/m    Assessment/Interventions: Review of patient past medical history, allergies, medications, health status, including review of consultants reports, laboratory and other test data, was performed as part of comprehensive evaluation and provision of chronic care management services.   SDOH:  (Social Determinants of Health) assessments and interventions performed: No  SDOH Screenings   Alcohol Screen: Not on file  Depression (PHQ2-9): Low Risk   . PHQ-2 Score: 0  Financial Resource Strain: Low Risk   . Difficulty of Paying Living Expenses: Not hard at all  Food Insecurity: No Food Insecurity  . Worried About Charity fundraiser in the Last Year: Never true  . Ran Out of Food in the Last Year: Never true  Housing: Not on file  Physical Activity: Insufficiently Active  . Days of Exercise per Week: 7 days  . Minutes of Exercise per Session: 20 min  Social Connections: Not on file  Stress: No Stress Concern Present  . Feeling of Stress : Not at all  Tobacco Use: Low Risk   . Smoking Tobacco Use: Never Smoker  . Smokeless Tobacco Use: Never Used  Transportation Needs: No Transportation Needs  . Lack of Transportation (Medical): No  . Lack of Transportation (Non-Medical): No    CCM Care Plan  No Known Allergies  Medications Reviewed Today    Reviewed by Kellie Simmering, LPN (Licensed Practical Nurse) on 04/01/20 at 17  Med List Status: <None>  Medication  Order Taking? Sig Documenting Provider Last Dose Status Informant  Accu-Chek Softclix Lancets lancets [id number] No USE AS DIRECTED [name], MD Taking Active   aspirin EC 81 MG tablet [id number] No Take 81 mg by mouth at bedtime. [provider] Taking Active            Med Note Pricilla Holm Feb 05, 2020  2:20 PM) Wait to start to taking ASA 81 after surgery, will be seen on  02/23/19  Coenzyme Q10 200 MG capsule [id number] No Take 200 mg by mouth at bedtime.  [provider] Taking Active Self  docusate sodium (COLACE) 100 MG capsule [id number] No Take 1 capsule (100 mg total) by mouth daily as needed for up to 30 doses. Janith Lima, MD Taking Active   enalapril (VASOTEC) 10 MG tablet [id number]  Take 1 tablet (10 mg total) by mouth daily. [name], MD  Active   glucose blood (ACCU-CHEK GUIDE) test strip [id number] No 1 each by Other route 2 (two) times daily. Use as instructed Rodriguez-Southworth, Sunday Spillers, PA-C Taking Active Other  insulin degludec (TRESIBA FLEXTOUCH) 100 UNIT/ML FlexTouch Pen [id number] No Inject 0.4 mLs (40 Units total) into the skin daily. 55 unites per day  Patient taking differently: Inject 40 Units into the skin at bedtime.   Shamleffer, Melanie Crazier, MD Taking Active Self  Insulin Pen Needle 32G X 4 MM MISC [id number] No 1 Device by Does not apply route in the morning, at noon, in the evening, and at bedtime. Shamleffer, Melanie Crazier, MD Taking Active   Krill Oil (OMEGA-3) 500 MG CAPS [id number] No Take 1 capsule by mouth daily. [provider] 01/23/2020 Unknown time Active   meclizine (ANTIVERT) 25 MG tablet [id number] No TAKE 1 TABLET THREE TIMES DAILY FOR DIZZINESS  Patient taking differently: Take 25 mg by mouth 3 (three) times daily as needed.   [name], MD Taking Active Self  melatonin 1 MG TABS tablet [id number] No Take 1 mg by mouth at bedtime. [provider] Taking Active   metoprolol succinate  (TOPROL-XL) 50 MG 24 hr tablet [id number] No TAKE 1 TABLET EVERY DAY (NEW DIRECTIONS)  Patient taking differently: Take 50 mg by mouth at bedtime.   Fay Records, MD Taking Active Self  Misc Natural Products Self [facility] ADVANCED PO) [id number] No Take by mouth at bedtime. [provider] Taking Active   nitroGLYCERIN (NITROSTAT) 0.4 MG SL tablet [id number] No Place 1 tablet (0.4 mg total) under the tongue every 5 (five) minutes as needed. Chest pain Fay Records, MD Taking Active Self           Med Note Burnard Leigh   Wed Jan 28, 2020 12:08 PM) Per pt last taken nitro 01/ 2020  omeprazole (PRILOSEC) 40 MG capsule [id number] No TAKE 1 CAPSULE EVERY DAY BEFORE A MEAL [name], MD Taking Active   oxyCODONE-acetaminophen (PERCOCET) 5-325 MG tablet [id number] No Take 1 tablet by mouth every 4 (four) hours as needed for up to 12 doses for severe pain. Janith Lima, MD Taking Active   simvastatin (ZOCOR) 40 MG tablet [id number] No TAKE 1 TABLET  EVERY EVENING. Gregory Brine, FNP Taking Active   triamcinolone cream (KENALOG) 0.1 % [id number] No Apply 1 application topically 3 (three) times daily. Apply to rash bid x 7-10 days, then prn when rash return Rodriguez-Southworth, Sunday Spillers, [place] Taking Active Self          Patient Active Problem List   Diagnosis Date Noted  . BPH (benign prostatic hyperplasia) 01/30/2020  . Low back pain 06/11/2019  . Type 2 diabetes mellitus with hyperglycemia, with long-term current use of insulin ([place]) 05/23/2019  . Type 2 diabetes mellitus with stage 3a chronic kidney disease, with long-term current use of insulin ([place]) 05/23/2019  . Dyslipidemia 05/23/2019  . Psoriasis 12/12/2018  . Skin lesion 10/31/2018  . Diabetes mellitus type 2, insulin dependent ([place]) 09/12/2018  . Elevated serum creatinine 09/12/2018  .  Hypothyroidism 10/10/2017  . Atherosclerosis of aorta ([place]) 10/10/2017  . Unspecified skin changes 10/10/2017  . Other long term (current)  drug therapy 10/10/2017  . Abdominal pain 09/21/2017  . Nausea & vomiting 09/21/2017  . History of head and neck cancer 09/21/2017  . History of colonic polyps 04/04/2016  . Dysphagia 04/04/2016  . Bloating 04/04/2016  . Diarrhea 04/04/2016  . Trigger finger of right thumb 08/23/2015  . Chest pain 07/15/2015  . Pain in the chest   . S/P CABG (coronary artery bypass graft)   . Noncompliance with medications 07/08/2015  . Acute gout 04/19/2015  . GERD (gastroesophageal reflux disease) 05/14/2014  . Depression, major, in remission ([place]) 03/27/2013  . Allergic rhinitis, cause unspecified 03/27/2013  . 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 Depressive disorder, not elsewhere classified 12/06/2011  . Nonspecific (abnormal) findings on radiological and other examination of gastrointestinal tract 02/10/2011  . Dysphagia, unspecified(787.20) 02/10/2011  . Palpitations 02/02/2011  . Dizziness 07/15/2010  . DIAB W/OPHTH MANIFESTS TYPE I [JUV TYPE] UNCNTRL 08/02/2009  . Mixed hyperlipidemia 08/02/2009  . Essential hypertension 08/02/2009  . CAD (coronary artery disease) 08/02/2009    Immunization History  Administered Date(s) Administered  . Influenza Split 11/24/2010, 12/06/2011  . Influenza, High Dose Seasonal PF 11/17/2014, 10/23/2017, 12/05/2018  . Influenza,inj,Quad PF,6+ Mos 11/25/2012, 01/01/2014  . Pneumococcal Conjugate-13 01/07/2015  . Pneumococcal Polysaccharide-23 10/21/2009  . Tdap 05/27/2009    Conditions to be addressed/monitored:  Hypertension and Diabetes  Care Plan : [place]  Updates made by Mayford Knife, RPH since 05/10/2020 12:00 AM    Problem: HTN, HLD, DM II   Priority: High    Long-Range Goal: Disease Management   Recent Progress: On track  Priority: High  Note:    Current Barriers:  . Unable to independently afford treatment regimen . Unable to achieve control of Diabetes    Pharmacist Clinical Goal(s):   [name] Over the next 90 days, patient will verbalize ability to afford treatment regimen . achieve adherence to monitoring guidelines and medication adherence to achieve therapeutic efficacy through collaboration with PharmD and provider.    Interventions: . 1:1 collaboration with [name], MD regarding development and update of comprehensive plan of care as evidenced by provider attestation and co-signature . Inter-disciplinary care team collaboration (see longitudinal plan of care) . Comprehensive medication review performed; medication list updated in electronic medical record  Hypertension (BP goal <130/80) -Uncontrolled -Current treatment: . Enalapril 10 mg tablet once per day  . Metoprolol Succinate 50 mg tablet daily  -Current home readings: wrist BP reading: 130/88, 125/67, 122/70, 112/67, 123/63  -Current dietary habits: patient is avoiding high salt foods  -Current exercise habits: walking more frequently  -Reports hypotensive/hypertensive symptoms -Educated on BP goals and benefits of medications for prevention of heart attack, stroke and kidney damage; Daily salt intake goal < 2300 mg; Exercise goal of 150 minutes per week; Importance of home blood pressure monitoring; -Counseled to monitor BP at home at least once per day , document, and provide log at future appointments -Counseled on diet and exercise extensively Recommended to continue current medication   Diabetes (A1c goal <7%) -Uncontrolled -Current medications: . Tresiba 100 unit/ml- Inject 40 units daily at bedtime  -Patient was sent testing supplies by [facility] mail order for a meter that he is not comfortable with -Has not been checking his glucose for two weeks -Reports that he would like to use his Accu Chek Aviva Plus meter because  it was easier to use, but the mail order pharmacy does not have any test strips available   -Current home glucose readings: patient reports that he is not checking his BS, his  insurance requires a different monitor be used.  -Denies hypoglycemic/hyperglycemic symptoms -Educated on Exercise goal of 150 minutes per week; Benefits of weight loss;  -Counseled to check feet daily and get yearly eye exams -Contacted patients other pharmacy [facility] and confirmed that they have test strips in stock per [place]. -New meter being sent to patient and will receive in the next five days -Recommended to continue current medication Collaborated with PCP team to have patients Accu Check test strips sent to [place] [facility] so he can start using them since he is more comfortable.  -Test strips are not covered by insurance so at this time patient is going to purchase a monitor from [place] to use for a couple days until it is delivered from mail order.   Hyperlipidemia: (LDL goal < 70) -Uncontrolled -Current treatment: . Simvastatin 40 mg tablet daily  -Current exercise habits: patient is still exercising  -Educated on Cholesterol goals;  Benefits of statin for ASCVD risk reduction; Importance of limiting foods high in cholesterol; Exercise goal of 150 minutes per week; -Counseled on diet and exercise extensively Recommended to continue current medication    Health Maintenance -Current therapy:  . Astrid Drafts- taking 1 capsule daily . Coenzyme Q10 taking 1 tablet daily  . Melatonin 1 mg tablet daily  -Educated on Herbal supplement research is limited and benefits usually cannot be proven Supplements may interfere with prescription drugs -Patient is satisfied with current therapy and denies issues   Patient Goals/Self-Care Activities . Over the next 30 days, patient will:  - take medications as prescribed focus on medication adherence by taking his medication at the same time each day.   Follow Up Plan: The patient has been provided with contact information for the care management team and has been advised to call with any health related questions or concerns.        Medication Assistance: Tresibaobtained through [employer] medication assistance program.  Enrollment ends 12/2020  Patient's preferred pharmacy is:  [facility], [address] AT [address] [place] [address] Phone: [phone] Fax: [facility] [place], [address] [address] Phone: [phone] Fax: [phone]  Uses pill box? Yes Pt endorses 90% compliance  We discussed: Benefits of medication synchronization, packaging and delivery as well as enhanced pharmacist oversight with [facility]. Patient decided to: Continue current medication management strategy  Care Plan and Follow Up Patient Decision:  Patient agrees to Care Plan and Follow-up.  Plan: Telephone follow up appointment with care management team member scheduled for:  06/16/2020 and The patient has been provided with contact information for the care management team and has been advised to call with any health related questions or concerns.   [name], PharmD Clinical Pharmacist [facility] [phone]

## 2020-05-10 ENCOUNTER — Other Ambulatory Visit: Payer: Self-pay

## 2020-05-10 DIAGNOSIS — E1165 Type 2 diabetes mellitus with hyperglycemia: Secondary | ICD-10-CM

## 2020-05-10 MED ORDER — ACCU-CHEK GUIDE W/DEVICE KIT: PACK | 2 refills | Status: DC

## 2020-05-10 MED ORDER — BD SWAB SINGLE USE REGULAR PADS: MEDICATED_PAD | 0 refills | Status: DC

## 2020-05-10 MED ORDER — ACCU-CHEK GUIDE CONTROL VI LIQD: 2 refills | Status: AC

## 2020-05-10 NOTE — Patient Instructions (Signed)
Visit Information It was great speaking with you today!  Please let me know if you have any questions about our visit.  Goals Addressed            This Visit's Progress   . Manage My Medicine       Timeframe:  Long-Range Goal Priority:  High Start Date:                             Expected End Date:                       Follow Up Date: 06/16/2020    - call for medicine refill 2 or 3 days before it runs out - call if I am sick and can't take my medicine - use an alarm clock or phone to remind me to take my medicine    Why is this important?   . These steps will help you keep on track with your medicines.        Patient Care Plan: Chronic Kidney (Adult)    Problem Identified: Disease Progression   Priority: High    Long-Range Goal: Disease Progression Prevented or Minimized   Start Date: 01/06/2020  Expected End Date: 04/05/2020  This Visit's Progress: On track  Priority: High  Note:   Current Barriers:   Ineffective Self Health Maintenance  Currently UNABLE TO independently self manage needs related to chronic health conditions.   Knowledge Deficits related to short term plan for care coordination needs and long term plans for chronic disease management needs Nurse Case Manager Clinical Goal(s):   Over the next 90 days, patient will work with care management team to address care coordination and chronic disease management needs related to Disease Management  Educational Needs  Care Coordination  Medication Management and Education  Medication Assistance   Psychosocial Support   Interventions:    Discuss potential for disease progression based on clinical diagnosis and/or causation and other risk factors including race, ethnicity, age, comorbidities, lifestyle.   Encourage lifestyle changes including maintaining a healthy weight, exercise and activity, limiting alcohol consumption, smoking cessation to improve long-term outcomes.  Patient Goals/Self Care  Activities:  - call for medicine refill 2 or 3 days before it runs out - keep follow-up appointments - set alarm to remind to take medications  Follow Up Plan: Telephone follow up appointment with care management team member scheduled for: 02/11/20       Patient Care Plan: Wellness (Adult)    Problem Identified: Medication Adherence (Wellness)   Priority: High    Goal: Medication Adherence Maintained   Start Date: 01/06/2020  Expected End Date: 03/08/2020  This Visit's Progress: On track  Priority: High  Note:   Current Barriers:   Ineffective Self Health Maintenance  Currently UNABLE TO independently self manage needs related to chronic health conditions.   Knowledge Deficits related to short term plan for care coordination needs and long term plans for chronic disease management needs Nurse Case Manager Clinical Goal(s):   Over the next 90 days, patient will work with care management team to address care coordination and chronic disease management needs related to Disease Management  Educational Needs  Care Coordination  Medication Management and Education  Medication Assistance   Psychosocial Support   Interventions:   Review all medications to determine if patient or caregiver knows why the medications are given and if taken as prescribed.   Complete  or review a medication adherence assessment including barriers to medication adherence.   Arrange and encourage counseling and medication review by pharmacist.   Assess barriers to medication adherence.   Encourage the use of medication reminders such as clock or cell phone alarm, color coding, pillboxes for am/pm and days of the week, pharmacy refill reminder, auto-refill system or mail-order option.   Assess presence of side effects; provide suggestions to manage or reduce side effects.  Patient Goals/Self Care Activities:  Over the next 90 days, patient will:  -consult with embedded Pharm D to discuss cost  assistance for [name]  -patient will continue to take [place] as prescribed w/o missed doses -patient will ask PCP for [place] drug samples if needed       Follow Up Plan: Telephone follow up appointment with care management team member scheduled for: 02/11/20       Patient Care Plan: Diabetes Type 2 (Adult)    Problem Identified: Glycemic Management (Diabetes, Type 2)     Long-Range Goal: Glycemic Management Optimized   Start Date: 01/06/2020  Expected End Date: 04/05/2020  This Visit's Progress: On track  Priority: High  Note:   Objective:  Lab Results  Component Value Date   HGBA1C 8.4 (H) 01/01/2020 .   Lab Results  Component Value Date   CREATININE 1.59 (H) 01/01/2020   CREATININE 1.48 (H) 09/18/2019   CREATININE 1.54 (H) 07/03/2019 .   [name] No results found for: EGFR Current Barriers:  [name] Knowledge Deficits related to basic Diabetes pathophysiology and self care/management . Knowledge Deficits related to medications used for management of diabetes . Financial Constraints Case Manager Clinical Goal(s):  [name] Over the next 90 days, patient will demonstrate improved adherence to prescribed treatment plan for diabetes self care/management as evidenced by:  . daily monitoring and recording of CBG  . adherence to ADA/ carb modified diet . exercise 5 days/week . adherence to prescribed medication regimen Interventions:  . Provided education to patient about basic DM disease process . Reviewed medications with patient and discussed importance of medication adherence . Discussed plans with patient for ongoing care management follow up and provided patient with direct contact information for care management team . Advised patient, providing education and rationale, to check cbg before meals and bedtime, before and after exercise and record, calling CCM team and or PCP for findings outside established parameters.   . Review of patient status, including review of consultants reports,  relevant laboratory and other test results, and medications completed.  Compare self-reported symptoms of hypo or hyperglycemia to blood glucose levels, diet and fluid intake, current medications, psychosocial and physiologic stressors, change in activity and barriers to care adherence.   Promote self-monitoring of blood glucose levels.  Patient Goals/Self-Care Activities . Over the next 90 days, patient will:  - Self administers oral medications as prescribed Self administers injectable DM medication [name]) as prescribed Attends all scheduled provider appointments Checks blood sugars as prescribed and utilize hyper and hypoglycemia protocol as needed Adheres to prescribed ADA/carb modified - check blood sugar at prescribed times - check blood sugar before and after exercise - check blood sugar if I feel it is too high or too low - enter blood sugar readings and medication or insulin into daily log - take the blood sugar log to all doctor visits - take the blood sugar meter to all doctor visits  Follow Up Plan: Telephone follow up appointment with care management team member scheduled for: 02/11/20  Problem Identified: Disease Progression (Diabetes, Type 2)   Priority: High    Long-Range Goal: Disease Progression Prevented or Minimized   Start Date: 01/06/2020  Expected End Date: 04/05/2020  This Visit's Progress: On track  Priority: High  Note:   CARE PLAN ENTRY (see longtitudinal plan of care for additional care plan information)  Objective:  Lab Results  Component Value Date   HGBA1C 8.4 (H) 01/01/2020  Objective:  Lab Results  Component Value Date   HGBA1C 8.4 (H) 01/01/2020 .   Lab Results  Component Value Date   CREATININE 1.59 (H) 01/01/2020   CREATININE 1.48 (H) 09/18/2019   CREATININE 1.54 (H) 07/03/2019 .   [name] No results found for: EGFR Current Barriers:  [name] Knowledge Deficits related to basic Diabetes pathophysiology and self  care/management . Knowledge Deficits related to medications used for management of diabetes . Difficulty obtaining or cannot afford medications . Financial Constraints Case Manager Clinical Goal(s):  [name] Over the next 90 days, patient will demonstrate improved adherence to prescribed treatment plan for diabetes self care/management as evidenced by: taking [place] exactly as prescribed w/o missed doses Interventions:  . Provided education to patient about basic DM disease process . Reviewed medications with patient and discussed importance of medication adherence . Discussed plans with patient for ongoing care management follow up and provided patient with direct contact information for care management team . Referral made to pharmacy team for assistance with cost assistance for [place] . Review of patient status, including review of consultants reports, relevant laboratory and other test results, and medications completed. Patient Goals/Self-Care Activities . Over the next 90 days, patient will:  - Self administers oral medications as prescribed Self administers injectable DM medication [name]) as prescribed Attends all scheduled provider appointments - check blood sugar at prescribed times - check blood sugar before and after exercise - check blood sugar if I feel it is too high or too low - enter blood sugar readings and medication or insulin into daily log - take the blood sugar log to all doctor visits - take the blood sugar meter to all doctor visits  Follow Up Plan: Telephone follow up appointment with care management team member scheduled for: 02/11/20     Patient Care Plan: Urinary Incontinence (Adult)    Problem Identified: Disorder Identification (Urinary Incontinence)   Priority: High    Goal: Urinary Incontinence Identified   Start Date: 01/06/2020  Expected End Date: 02/06/2020  This Visit's Progress: On track  Priority: High  Note:   Current Barriers:    Ineffective Self Health Maintenance  Currently UNABLE TO independently self manage needs related to chronic health conditions.   Knowledge Deficits related to short term plan for care coordination needs and long term plans for chronic disease management needs Nurse Case Manager Clinical Goal(s):   Over the next 30 days, patient will work with care management team to address care coordination and chronic disease management needs related to Disease Management  Educational Needs  Care Coordination  Medication Management and Education  Medication Assistance   Psychosocial Support   Interventions:   Determine severity and nature of incontinence, such as stress, urge or mixed incontinence) to assist in definitive diagnosis.   Determined patient is scheduled to undergo a TURP per Dr. Abner Greenspan, Urologist on 1/7/3f  Determined patient is able to verbalize a good understanding about this procedure and what to expect post surgery  Determined patient understands he should stop his ASA 5 days prior to the procedure  per Dr. Abner Greenspan Patient Goals/Self Care Activities:  Over the next 30 days, patient will:  -undergo a transurethral resection of the prostate as scheduled, 01/30/20 -hold ASA 5 days prior to this procedure as directed by Urologist   Follow Up Plan: Telephone follow up appointment with care management team member scheduled for: 02/11/20       Patient Care Plan: CCM Pharmacy Care Plan    Problem Identified: HTN, HLD, DM II   Priority: High    Long-Range Goal: Disease Management   Recent Progress: On track  Priority: High  Note:    Current Barriers:  . Unable to independently afford treatment regimen . Unable to achieve control of Diabetes    Pharmacist Clinical Goal(s):  [name] Over the next 90 days, patient will verbalize ability to afford treatment regimen . achieve adherence to monitoring guidelines and medication adherence to achieve therapeutic efficacy through  collaboration with PharmD and provider.    Interventions: . 1:1 collaboration with [name], MD regarding development and update of comprehensive plan of care as evidenced by provider attestation and co-signature . Inter-disciplinary care team collaboration (see longitudinal plan of care) . Comprehensive medication review performed; medication list updated in electronic medical record  Hypertension (BP goal <130/80) -Uncontrolled -Current treatment: . Enalapril 10 mg tablet once per day  . Metoprolol Succinate 50 mg tablet daily  -Current home readings: wrist BP reading: 130/88, 125/67, 122/70, 112/67, 123/63  -Current dietary habits: patient is avoiding high salt foods  -Current exercise habits: walking more frequently  -Reports hypotensive/hypertensive symptoms -Educated on BP goals and benefits of medications for prevention of heart attack, stroke and kidney damage; Daily salt intake goal < 2300 mg; Exercise goal of 150 minutes per week; Importance of home blood pressure monitoring; -Counseled to monitor BP at home at least once per day , document, and provide log at future appointments -Counseled on diet and exercise extensively Recommended to continue current medication   Diabetes (A1c goal <7%) -Uncontrolled -Current medications: . Tresiba 100 unit/ml- Inject 40 units daily at bedtime  -Patient was sent testing supplies by [facility] mail order for a meter that he is not comfortable with -Has not been checking his glucose for two weeks -Reports that he would like to use his Accu Chek Aviva Plus meter because it was easier to use, but the mail order pharmacy does not have any test strips available   -Current home glucose readings: patient reports that he is not checking his BS, his insurance requires a different monitor be used.  -Denies hypoglycemic/hyperglycemic symptoms -Educated on Exercise goal of 150 minutes per week; Benefits of weight loss;  -Counseled to check feet  daily and get yearly eye exams -Contacted patients other pharmacy [facility] and confirmed that they have test strips in stock per [place]. -New meter being sent to patient and will receive in the next five days -Recommended to continue current medication Collaborated with PCP team to have patients Accu Check test strips sent to [facility] so he can start using them since he is more comfortable.  -Test strips are not covered by insurance so at this time patient is going to purchase a monitor from [place] to use for a couple days until it is delivered from mail order.   Hyperlipidemia: (LDL goal < 70) -Uncontrolled -Current treatment: . Simvastatin 40 mg tablet daily  -Current exercise habits: patient is still exercising  -Educated on Cholesterol goals;  Benefits of statin for ASCVD risk reduction; Importance of limiting foods high  in cholesterol; Exercise goal of 150 minutes per week; -Counseled on diet and exercise extensively Recommended to continue current medication    Health Maintenance -Current therapy:  . Astrid Drafts- taking 1 capsule daily . Coenzyme Q10 taking 1 tablet daily  . Melatonin 1 mg tablet daily  -Educated on Herbal supplement research is limited and benefits usually cannot be proven Supplements may interfere with prescription drugs -Patient is satisfied with current therapy and denies issues   Patient Goals/Self-Care Activities . Over the next 30 days, patient will:  - take medications as prescribed focus on medication adherence by taking his medication at the same time each day.   Follow Up Plan: The patient has been provided with contact information for the care management team and has been advised to call with any health related questions or concerns.       Patient agreed to services and verbal consent obtained.   The patient verbalized understanding of instructions, educational materials, and care plan provided today and agreed to receive a  mailed copy of patient instructions, educational materials, and care plan.   [name], PharmD Clinical Pharmacist [facility] [phone]

## 2020-05-12 ENCOUNTER — Other Ambulatory Visit: Payer: Self-pay

## 2020-05-12 DIAGNOSIS — E1165 Type 2 diabetes mellitus with hyperglycemia: Secondary | ICD-10-CM

## 2020-05-12 MED ORDER — ACCU-CHEK GUIDE W/DEVICE KIT: PACK | 2 refills | Status: AC

## 2020-05-25 DIAGNOSIS — C61 Malignant neoplasm of prostate: Secondary | ICD-10-CM | POA: Diagnosis not present

## 2020-05-28 ENCOUNTER — Telehealth: Payer: Self-pay

## 2020-05-28 ENCOUNTER — Telehealth: Payer: Medicare HMO

## 2020-05-28 NOTE — Telephone Encounter (Signed)
  Chronic Care Management   Outreach Note  05/28/2020 Name: Demarcus Thielke MRN: [id number] DOB: January 25, 1950  Referred by: Minette Brine, FNP Reason for referral : Chronic Care Management (#2 RN CM FU Call Attempt )   A second unsuccessful telephone outreach was attempted today. The patient was referred to the case management team for assistance with care management and care coordination.   Follow Up Plan: The care management team will reach out to the patient again over the next 30-45 days.   Barb Merino, RN, BSN, CCM Care Management Coordinator [facility]  Direct Phone: [phone]

## 2020-06-03 DIAGNOSIS — C61 Malignant neoplasm of prostate: Secondary | ICD-10-CM | POA: Diagnosis not present

## 2020-06-03 DIAGNOSIS — R351 Nocturia: Secondary | ICD-10-CM | POA: Diagnosis not present

## 2020-06-03 DIAGNOSIS — N401 Enlarged prostate with lower urinary tract symptoms: Secondary | ICD-10-CM | POA: Diagnosis not present

## 2020-06-15 ENCOUNTER — Telehealth: Payer: Self-pay

## 2020-06-15 NOTE — Chronic Care Management (AMB) (Signed)
   Patient aware of telephone appointment with [name] [facility] on 06-16-2020 at 9:00 Patient aware to have/bring all medications, supplements, blood pressure and/or blood sugar logs to visit.  Questions: Have you had any recent office visit or specialist visit outside of [place]? Patient stated he had a follow up visit with his urologist at [facility] and PSA labs on 05-25-20 and 06-03-2020.  Are there any concerns you would like to discuss during your office visit? Patient stated no concerns  Are you having any problems obtaining your medications? Patient stated no.  If patient has any PAP medications ask if they are having any problems getting their PAP medication or refill? Patient stated no.  Star Rating Drug: Enalapril 10 MG- Last filled 03-15-2020 [facility] pharmacy unknown Simvastatin 40 MG- Last filled 04-23-2020 [facility] pharmacy unknown   Any gaps in medications fill history?  No   [place] Pharmacist Assistant [phone]

## 2020-06-16 ENCOUNTER — Ambulatory Visit (INDEPENDENT_AMBULATORY_CARE_PROVIDER_SITE_OTHER): Payer: Medicare HMO

## 2020-06-16 ENCOUNTER — Telehealth: Payer: Self-pay

## 2020-06-16 DIAGNOSIS — E782 Mixed hyperlipidemia: Secondary | ICD-10-CM

## 2020-06-16 DIAGNOSIS — E1165 Type 2 diabetes mellitus with hyperglycemia: Secondary | ICD-10-CM

## 2020-06-16 DIAGNOSIS — I10 Essential (primary) hypertension: Secondary | ICD-10-CM

## 2020-06-16 DIAGNOSIS — Z794 Long term (current) use of insulin: Secondary | ICD-10-CM | POA: Diagnosis not present

## 2020-06-16 NOTE — Progress Notes (Signed)
Chronic Care Management Pharmacy Note  06/16/2020 Name:  Gregory Friedman MRN:  [id number] DOB:  1950/05/29  Subjective: Gregory Friedman is an 70 y.o. year old male who is a primary patient of Minette Brine, [place].  The CCM team was consulted for assistance with disease management and care coordination needs.  Patient reports that he is doing okay.   Engaged with patient by telephone for follow up visit in response to provider referral for pharmacy case management and/or care coordination services.   Consent to Services:  The patient was given information about Chronic Care Management services, agreed to services, and gave verbal consent prior to initiation of services.  Please see initial visit note for detailed documentation.   Patient Care Team: Minette Brine, [place] as PCP - General ([place]) Fay Records, MD as PCP - Cardiology (Cardiology) Rozetta Nunnery, MD as Attending Physician (Otolaryngology) Eppie Gibson, MD as Attending Physician (Radiation Oncology) Fay Records, MD as Attending Physician (Cardiology) Inda Castle, MD (Inactive) as Attending Physician (Gastroenterology) Lynne Logan, RN as Case Manager Mayford Knife, [facility] (Pharmacist)  Recent office visits: 04/01/2020 PCP OV  Recent consult visits: 05/25/2020 Urologist Consult 04/28/2020-Otolaryngology Initial Consult- Sensorineural hearing loss   ED visits: 02/24/2020 Urinary Retention ED visit  Objective:  Lab Results  Component Value Date   CREATININE 1.41 (H) 04/01/2020   BUN 12 04/01/2020   GFRNONAA 47 (L) 01/31/2020   GFRAA 50 (L) 01/01/2020   NA 140 04/01/2020   K 4.2 04/01/2020   CALCIUM 9.7 04/01/2020   CO2 24 04/01/2020   GLUCOSE 226 (H) 04/01/2020    Lab Results  Component Value Date/Time   HGBA1C 9.0 (H) 04/01/2020 12:40 PM   HGBA1C 8.4 (H) 01/01/2020 02:57 PM   MICROALBUR 10 10/31/2018 10:57 AM   MICROALBUR 1.0 04/19/2015 12:01 AM   MICROALBUR 0.2 05/14/2014  12:01 AM    Last diabetic Eye exam:  Lab Results  Component Value Date/Time   HMDIABEYEEXA No Retinopathy 09/14/2015 12:00 AM    Last diabetic Foot exam: No results found for: HMDIABFOOTEX   Lab Results  Component Value Date   CHOL 167 04/01/2020   HDL 46 04/01/2020   LDLCALC 90 04/01/2020   TRIG 179 (H) 04/01/2020   CHOLHDL 3.6 04/01/2020    Hepatic Function Latest Ref Rng & Units 04/01/2020 01/01/2020 09/18/2019  Total Protein 6.0 - 8.5 g/dL 8.0 7.7 7.9  Albumin 3.8 - 4.8 g/dL 4.8 4.8 5.0(H)  AST 0 - 40 IU/L _0 ALT 0 - 44 IU/L _1 Alk Phosphatase 44 - 121 IU/L 88 75 61  Total Bilirubin 0.0 - 1.2 mg/dL 0.4 0.4 0.7  Bilirubin, Direct 0.0 - 0.3 mg/dL - - -    Lab Results  Component Value Date/Time   TSH 2.500 01/01/2020 02:57 PM   TSH 4.080 10/31/2018 12:15 PM   FREET4 1.13 10/31/2018 12:15 PM   FREET4 1.46 04/25/2018 12:18 PM    CBC Latest Ref Rng & Units 04/01/2020 01/31/2020 01/30/2020  WBC 3.4 - 10.8 x10E3/uL 5.7 6.3 -  Hemoglobin 13.0 - 17.7 g/dL 13.2 11.9(L) 14.6  Hematocrit 37.5 - 51.0 % 41.3 36.5(L) 43.0  Platelets 150 - 450 x10E3/uL 200 130(L) -    Lab Results  Component Value Date/Time   VD25OH 76.0 04/17/2019 03:17 PM    Clinical ASCVD: Yes  The 10-year ASCVD risk score Mikey Bussing DC Jr., et al., 2013) is: 42.5%   Values used to calculate the  score:     Age: 19 years     Sex: Male     Is Non-Hispanic African American: No     Diabetic: Yes     Tobacco smoker: No     Systolic Blood Pressure: 597 mmHg     Is BP treated: Yes     HDL Cholesterol: 46 mg/dL     Total Cholesterol: 167 mg/dL    Depression screen [facility] 2/9 04/01/2020 04/01/2020 04/01/2020  Decreased Interest 0 0 0  Down, Depressed, Hopeless 0 0 0  PHQ - 2 Score 0 0 0  Altered sleeping - 0 -  Tired, decreased energy - 0 -  Change in appetite - 0 -  Feeling bad or failure about yourself  - 0 -  Trouble concentrating - 0 -  Moving slowly or fidgety/restless - 0 -  Suicidal thoughts - 0 -   PHQ-9 Score - 0 -  Difficult doing work/chores - - -  Some recent data might be hidden     Social History   Tobacco Use  Smoking Status Never Smoker  Smokeless Tobacco Never Used   BP Readings from Last 3 Encounters:  04/01/20 128/70  02/24/20 (!) 142/82  01/31/20 120/72   Pulse Readings from Last 3 Encounters:  04/01/20 (!) 51  02/24/20 66  01/31/20 (!) 55   Wt Readings from Last 3 Encounters:  04/01/20 192 lb (87.1 kg)  04/01/20 192 lb 3.2 oz (87.2 kg)  01/30/20 193 lb 9.6 oz (87.8 kg)   BMI Readings from Last 3 Encounters:  04/01/20 30.25 kg/m  04/01/20 30.28 kg/m  01/30/20 29.44 kg/m    Assessment/Interventions: Review of patient past medical history, allergies, medications, health status, including review of consultants reports, laboratory and other test data, was performed as part of comprehensive evaluation and provision of chronic care management services.   SDOH:  (Social Determinants of Health) assessments and interventions performed: No  SDOH Screenings   Alcohol Screen: Not on file  Depression (PHQ2-9): Low Risk   . PHQ-2 Score: 0  Financial Resource Strain: Low Risk   . Difficulty of Paying Living Expenses: Not hard at all  Food Insecurity: No Food Insecurity  . Worried About Charity fundraiser in the Last Year: Never true  . Ran Out of Food in the Last Year: Never true  Housing: Not on file  Physical Activity: Insufficiently Active  . Days of Exercise per Week: 7 days  . Minutes of Exercise per Session: 20 min  Social Connections: Not on file  Stress: No Stress Concern Present  . Feeling of Stress : Not at all  Tobacco Use: Low Risk   . Smoking Tobacco Use: Never Smoker  . Smokeless Tobacco Use: Never Used  Transportation Needs: No Transportation Needs  . Lack of Transportation (Medical): No  . Lack of Transportation (Non-Medical): No    CCM Care Plan  No Known Allergies  Medications Reviewed Today    Reviewed by Mayford Knife,  [facility] (Pharmacist) on 06/16/20 at 0934  Med List Status: <None>  Medication Order Taking? Sig Documenting Provider Last Dose Status Informant  Accu-Chek Softclix Lancets lancets [id number] Yes USE AS DIRECTED [name], MD Taking Active   Alcohol Swabs (B-D SINGLE USE SWABS REGULAR) PADS [id number] Yes Use as directed to check blood sugars E11.65 Minette Brine, FNP Taking Active   aspirin EC 81 MG tablet [id number] Yes Take 81 mg by mouth at bedtime. [provider] Taking Active  Med Note Pricilla Holm Feb 05, 2020  2:20 PM) Wait to start to taking ASA 81 after surgery, will be seen on 02/23/19  Blood Glucose Calibration (ACCU-CHEK GUIDE CONTROL) Yehuda Budd [id number] Yes Use as directed E11.65 Minette Brine, FNP Taking Active   Blood Glucose Monitoring Suppl (ACCU-CHEK GUIDE) w/Device KIT [id number] Yes Use to check blood sugars daily E11.65 Minette Brine, FNP Taking Active   Coenzyme Q10 200 MG capsule [id number] Yes Take 200 mg by mouth at bedtime.  [provider] Taking Active Self  docusate sodium (COLACE) 100 MG capsule [id number] No Take 1 capsule (100 mg total) by mouth daily as needed for up to 30 doses.  Patient not taking: Reported on 06/16/2020   Janith Lima, MD Not Taking Active   enalapril (VASOTEC) 10 MG tablet [id number] Yes Take 1 tablet (10 mg total) by mouth daily. [name], MD Taking Active   insulin degludec [facility]) 100 UNIT/ML FlexTouch Pen [id number] Yes Inject 0.4 mLs (40 Units total) into the skin daily. 55 unites per day  Patient taking differently: Inject 40 Units into the skin at bedtime.   Shamleffer, Melanie Crazier, MD Taking Active Self  Insulin Pen Needle 32G X 4 MM MISC [id number] Yes 1 Device by Does not apply route in the morning, at noon, in the evening, and at bedtime. Shamleffer, Melanie Crazier, MD Taking Active   Krill Oil (OMEGA-3) 500 MG CAPS [id number]  Take 1 capsule by mouth daily. [provider]   Active   meclizine (ANTIVERT) 25 MG tablet [id number] Yes Take 1 tablet (25 mg total) by mouth 3 (three) times daily as needed. [name], MD Taking Active   melatonin 1 MG TABS tablet [id number] Yes Take 1 mg by mouth at bedtime. [provider] Taking Active   metoprolol succinate (TOPROL-XL) 50 MG 24 hr tablet [id number] Yes Take 1 tablet (50 mg total) by mouth at bedtime. Fay Records, MD Taking Active   Misc Natural Products [facility] ADVANCED PO) [id number] Yes Take by mouth at bedtime. [provider] Taking Active   nitroGLYCERIN (NITROSTAT) 0.4 MG SL tablet [id number] Yes Place 1 tablet (0.4 mg total) under the tongue every 5 (five) minutes as needed. Chest pain Fay Records, MD Taking Active Self           Med Note Dyke Brackett Jan 28, 2020 12:08 PM) Per pt last taken nitro 01/ 2020  omeprazole (PRILOSEC) 40 MG capsule [id number] Yes TAKE 1 CAPSULE EVERY DAY BEFORE A MEAL [name], MD Taking Active   oxyCODONE-acetaminophen (PERCOCET) 5-325 MG tablet [id number] Yes Take 1 tablet by mouth every 4 (four) hours as needed for up to 12 doses for severe pain. Janith Lima, MD Taking Active   simvastatin (ZOCOR) 40 MG tablet [id number] Yes TAKE 1 TABLET  EVERY EVENING. Minette Brine, FNP Taking Active   triamcinolone cream (KENALOG) 0.1 % [id number] Yes Apply 1 application topically 3 (three) times daily. Apply to rash bid x 7-10 days, then prn when rash return Rodriguez-[place], Sunday Spill[place], [place] Taking Active Self          Patient Active Problem List   Diagnosis Date Noted  . BPH (benign prostatic hyperplasia) 01/30/2020  . Low back pain 06/11/2019  . Type 2 diabetes mellitus with hyperglycemia, with long-term current use of insulin ([place]) 05/23/2019  . Type 2 diabetes mellitus with stage 3a chronic kidney disease, with long-term current use of insulin ([place])  05/23/2019  . Dyslipidemia 05/23/2019  . Psoriasis 12/12/2018  . Skin lesion 10/31/2018  .  Diabetes mellitus type 2, insulin dependent ([place]) 09/12/2018  . Elevated serum creatinine 09/12/2018  . Hypothyroidism 10/10/2017  . Atherosclerosis of aorta ([place]) 10/10/2017  . Unspecified skin changes 10/10/2017  . Other long term (current) drug therapy 10/10/2017  . Abdominal pain 09/21/2017  . Nausea & vomiting 09/21/2017  . History of head and neck cancer 09/21/2017  . History of colonic polyps 04/04/2016  . Dysphagia 04/04/2016  . Bloating 04/04/2016  . Diarrhea 04/04/2016  . Trigger finger of right thumb 08/23/2015  . Chest pain 07/15/2015  . Pain in the chest   . S/P CABG (coronary artery bypass graft)   . Noncompliance with medications 07/08/2015  . Acute gout 04/19/2015  . GERD (gastroesophageal reflux disease) 05/14/2014  . Depression, major, in remission ([place]) 03/27/2013  . Allergic rhinitis, cause unspecified 03/27/2013  . 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 Depressive disorder, not elsewhere classified 12/06/2011  . Nonspecific (abnormal) findings on radiological and other examination of gastrointestinal tract 02/10/2011  . Dysphagia, unspecified(787.20) 02/10/2011  . Palpitations 02/02/2011  . Dizziness 07/15/2010  . DIAB W/OPHTH MANIFESTS TYPE I [JUV TYPE] UNCNTRL 08/02/2009  . Mixed hyperlipidemia 08/02/2009  . Essential hypertension 08/02/2009  . CAD (coronary artery disease) 08/02/2009    Immunization History  Administered Date(s) Administered  . Influenza Split 11/24/2010, 12/06/2011  . Influenza, High Dose Seasonal PF 11/17/2014, 10/23/2017, 12/05/2018  . Influenza,inj,Quad PF,6+ Mos 11/25/2012, 01/01/2014  . Pneumococcal Conjugate-13 01/07/2015  . Pneumococcal Polysaccharide-23 10/21/2009  . Tdap 05/27/2009    Conditions to be addressed/monitored:  Hypertension, Hyperlipidemia and Diabetes  Care Plan : [place]  Updates made by Mayford Knife, RPH since 06/16/2020 12:00 AM    Problem: HTN, HLD, DM II    Priority: High    Long-Range Goal: Disease Management   Recent Progress: On track  Priority: High  Note:    Current Barriers:  . Unable to independently afford treatment regimen . Unable to achieve control of Diabetes    Pharmacist Clinical Goal(s):  [name] Over the next 90 days, patient will verbalize ability to afford treatment regimen . achieve adherence to monitoring guidelines and medication adherence to achieve therapeutic efficacy through collaboration with PharmD and provider.    Interventions: . 1:1 collaboration with [name], MD regarding development and update of comprehensive plan of care as evidenced by provider attestation and co-signature . Inter-disciplinary care team collaboration (see longitudinal plan of care) . Comprehensive medication review performed; medication list updated in electronic medical record  Hypertension (BP goal <130/80) -Controlled -Current treatment: . Metoprolol succinate 50 mg tablet once per day  . Enalapril 10 mg tablet daily  -Current home readings: he is checking his BP everyday, he is using a wrist BP cuff and his current readings are : 120/64, 118/65, 119/63, 123/63, 117/66, 103/65, 129/67 -Denies hypotensive/hypertensive symptoms -Educated on BP goals and benefits of medications for prevention of heart attack, stroke and kidney damage; Importance of home blood pressure monitoring; Symptoms of hypotension and importance of maintaining adequate hydration; -Counseled to monitor BP at home at least once per day, document, and provide log at future appointments -Recommended to continue current medication  Diabetes (A1c goal <8%) -Uncontrolled -Current medications: . [name] Flextouch: Inject 0.4 ml into the skin daily.  o Patient is receiving patient assistance through [employer] through 12/22/2020.  o Patient needs a reorder reminder for patients  medication.  -Medications previously tried: Jardiance-issues with yeast  infection, Kombiglyze XR- did not work, Research officer, trade [employer] - bad reaction-was not able to eat, bad nausea.  -Current home glucose readings . fasting glucose: [phone] (Patient reports he has not had any low blood sugars)  -Denies hypoglycemic/hyperglycemic symptoms -Current meal patterns:  . breakfast: oatmeal, eggs, staying away from bread, patient reports cheating on his meal plan sometimes . drinks: goal: drinking plenty of water  -Current exercise: gardening -Patient is going to start checking his glucose 2 hours after his first meal which is the largest meal of the day, patient reports he has 3 boxes of 50.  -Educated on A1c and blood sugar goals; Exercise goal of 150 minutes per week; Prevention and management of hypoglycemic episodes;  -Patient verbalized understanding of hypoglycemia, and he knows how to treat it.  -Counseled to check feet daily and get yearly eye exams -Recommended to continue current medication -Collaborate with PCP to have patients glucose test strip prescription changed to twice per day.   Atherosclerosis/Hyperlipidemia: (LDL goal < 70) -Uncontrolled -Current treatment: . Simvastatin 40 mg tablet once per day  -Current dietary patterns: patient reports that he is not eating a lot of fried and fatty foods.  -Current exercise habits: patient reports gardening.  -Patient reports that he is eating more healthy adding avocado to his meal.  -Educated on Cholesterol goals;  Benefits of statin for ASCVD risk reduction; Importance of limiting foods high in cholesterol; -Recommended to continue current medication  -Recommend that patient have lab work completed in the morning as a fasting lab will collaborate with PCP team to determine scheduling options .   Health Maintenance -Current therapy:  . Coenzyme Q10 taking 1 tablet daily  . Melatonin 1 mg tablet daily  -Educated on Herbal supplement research is limited and benefits usually cannot be proven Supplements may  interfere with prescription drugs -Patient is satisfied with current therapy and denies issues   Patient Goals/Self-Care Activities . Over the next 30 days, patient will:  - take medications as prescribed focus on medication adherence by taking his medication at the same time each day.  -check glucose twice per day, document, and provide at future appointments  Follow Up Plan:  Telephone follow up appointment with care management team member scheduled for: 07/07/2020 The patient has been provided with contact information for the care management team and has been advised to call with any health related questions or concerns.       Medication Assistance: [name] obtained through [employer] medication assistance program.  Enrollment ends 11/2020.  Compliance/Adherence/Medication fill history: Care Gaps: -Patient due for an annual eye exam on 06/22/2020  Star-Rating Drugs: Enalapril 10 mg tablet daily - sent automatically through [facility] Simvastatin 40 mg tablet daily - sent automatically through [facility]   Patient's preferred pharmacy is:  [facility], [place] - [address] AT [place] [address] Phone: [phone] Fax: [facility] - [place], [place] [address] Phone: [phone] Fax: [phone]  Uses pill box? Yes Pt endorses 100% compliance  We discussed: Current pharmacy is preferred with insurance plan and patient is satisfied with pharmacy services Patient decided to: Continue current medication management strategy  Care Plan and Follow Up Patient Decision:  Patient agrees to Care Plan and Follow-up.  Plan: Telephone follow up appointment with care management team  member scheduled for:  07/07/2020 and The patient has been provided with contact information for the care management team and has been advised to call with  any health related questions or concerns.   [name], PharmD Clinical Pharmacist [facility] [phone]

## 2020-06-16 NOTE — Patient Instructions (Addendum)
Visit Information It was great speaking with you today!  Please let me know if you have any questions about our visit.  Goals Addressed            This Visit's Progress   . Manage My Medicine       Timeframe:  Long-Range Goal Priority:  High Start Date:                             Expected End Date:                       Follow Up Date: 07/07/2020    - call for medicine refill 2 or 3 days before it runs out - call if I am sick and can't take my medicine - use an alarm clock or phone to remind me to take my medicine    Why is this important?   . These steps will help you keep on track with your medicines.        Patient Care Plan: Chronic Kidney (Adult)    Problem Identified: Disease Progression   Priority: High    Long-Range Goal: Disease Progression Prevented or Minimized   Start Date: 01/06/2020  Expected End Date: 04/05/2020  This Visit's Progress: On track  Priority: High  Note:   Current Barriers:   Ineffective Self Health Maintenance  Currently UNABLE TO independently self manage needs related to chronic health conditions.   Knowledge Deficits related to short term plan for care coordination needs and long term plans for chronic disease management needs Nurse Case Manager Clinical Goal(s):   Over the next 90 days, patient will work with care management team to address care coordination and chronic disease management needs related to Disease Management  Educational Needs  Care Coordination  Medication Management and Education  Medication Assistance   Psychosocial Support   Interventions:    Discuss potential for disease progression based on clinical diagnosis and/or causation and other risk factors including race, ethnicity, age, comorbidities, lifestyle.   Encourage lifestyle changes including maintaining a healthy weight, exercise and activity, limiting alcohol consumption, smoking cessation to improve long-term outcomes.  Patient Goals/Self Care  Activities:  - call for medicine refill 2 or 3 days before it runs out - keep follow-up appointments - set alarm to remind to take medications  Follow Up Plan: Telephone follow up appointment with care management team member scheduled for: 02/11/20       Patient Care Plan: Wellness (Adult)    Problem Identified: Medication Adherence (Wellness)   Priority: High    Goal: Medication Adherence Maintained   Start Date: 01/06/2020  Expected End Date: 03/08/2020  This Visit's Progress: On track  Priority: High  Note:   Current Barriers:   Ineffective Self Health Maintenance  Currently UNABLE TO independently self manage needs related to chronic health conditions.   Knowledge Deficits related to short term plan for care coordination needs and long term plans for chronic disease management needs Nurse Case Manager Clinical Goal(s):   Over the next 90 days, patient will work with care management team to address care coordination and chronic disease management needs related to Disease Management  Educational Needs  Care Coordination  Medication Management and Education  Medication Assistance   Psychosocial Support   Interventions:   Review all medications to determine if patient or caregiver knows why the medications are given and if taken as prescribed.   Complete  or review a medication adherence assessment including barriers to medication adherence.   Arrange and encourage counseling and medication review by pharmacist.   Assess barriers to medication adherence.   Encourage the use of medication reminders such as clock or cell phone alarm, color coding, pillboxes for am/pm and days of the week, pharmacy refill reminder, auto-refill system or mail-order option.   Assess presence of side effects; provide suggestions to manage or reduce side effects.  Patient Goals/Self Care Activities:  Over the next 90 days, patient will:  -consult with embedded Pharm D to discuss cost  assistance for [name]  -patient will continue to take [place] as prescribed w/o missed doses -patient will ask PCP for [place] drug samples if needed       Follow Up Plan: Telephone follow up appointment with care management team member scheduled for: 02/11/20       Patient Care Plan: Diabetes Type 2 (Adult)    Problem Identified: Glycemic Management (Diabetes, Type 2)     Long-Range Goal: Glycemic Management Optimized   Start Date: 01/06/2020  Expected End Date: 04/05/2020  This Visit's Progress: On track  Priority: High  Note:   Objective:  Lab Results  Component Value Date   HGBA1C 8.4 (H) 01/01/2020 .   Lab Results  Component Value Date   CREATININE 1.59 (H) 01/01/2020   CREATININE 1.48 (H) 09/18/2019   CREATININE 1.54 (H) 07/03/2019 .   [name] No results found for: EGFR Current Barriers:  [name] Knowledge Deficits related to basic Diabetes pathophysiology and self care/management . Knowledge Deficits related to medications used for management of diabetes . Financial Constraints Case Manager Clinical Goal(s):  [name] Over the next 90 days, patient will demonstrate improved adherence to prescribed treatment plan for diabetes self care/management as evidenced by:  . daily monitoring and recording of CBG  . adherence to ADA/ carb modified diet . exercise 5 days/week . adherence to prescribed medication regimen Interventions:  . Provided education to patient about basic DM disease process . Reviewed medications with patient and discussed importance of medication adherence . Discussed plans with patient for ongoing care management follow up and provided patient with direct contact information for care management team . Advised patient, providing education and rationale, to check cbg before meals and bedtime, before and after exercise and record, calling CCM team and or PCP for findings outside established parameters.   . Review of patient status, including review of consultants reports,  relevant laboratory and other test results, and medications completed.  Compare self-reported symptoms of hypo or hyperglycemia to blood glucose levels, diet and fluid intake, current medications, psychosocial and physiologic stressors, change in activity and barriers to care adherence.   Promote self-monitoring of blood glucose levels.  Patient Goals/Self-Care Activities . Over the next 90 days, patient will:  - Self administers oral medications as prescribed Self administers injectable DM medication [name]) as prescribed Attends all scheduled provider appointments Checks blood sugars as prescribed and utilize hyper and hypoglycemia protocol as needed Adheres to prescribed ADA/carb modified - check blood sugar at prescribed times - check blood sugar before and after exercise - check blood sugar if I feel it is too high or too low - enter blood sugar readings and medication or insulin into daily log - take the blood sugar log to all doctor visits - take the blood sugar meter to all doctor visits  Follow Up Plan: Telephone follow up appointment with care management team member scheduled for: 02/11/20  Problem Identified: Disease Progression (Diabetes, Type 2)   Priority: High    Long-Range Goal: Disease Progression Prevented or Minimized   Start Date: 01/06/2020  Expected End Date: 04/05/2020  This Visit's Progress: On track  Priority: High  Note:   CARE PLAN ENTRY (see longtitudinal plan of care for additional care plan information)  Objective:  Lab Results  Component Value Date   HGBA1C 8.4 (H) 01/01/2020  Objective:  Lab Results  Component Value Date   HGBA1C 8.4 (H) 01/01/2020 .   Lab Results  Component Value Date   CREATININE 1.59 (H) 01/01/2020   CREATININE 1.48 (H) 09/18/2019   CREATININE 1.54 (H) 07/03/2019 .   [name] No results found for: EGFR Current Barriers:  [name] Knowledge Deficits related to basic Diabetes pathophysiology and self  care/management . Knowledge Deficits related to medications used for management of diabetes . Difficulty obtaining or cannot afford medications . Financial Constraints Case Manager Clinical Goal(s):  [name] Over the next 90 days, patient will demonstrate improved adherence to prescribed treatment plan for diabetes self care/management as evidenced by: taking [place] exactly as prescribed w/o missed doses Interventions:  . Provided education to patient about basic DM disease process . Reviewed medications with patient and discussed importance of medication adherence . Discussed plans with patient for ongoing care management follow up and provided patient with direct contact information for care management team . Referral made to pharmacy team for assistance with cost assistance for [place] . Review of patient status, including review of consultants reports, relevant laboratory and other test results, and medications completed. Patient Goals/Self-Care Activities . Over the next 90 days, patient will:  - Self administers oral medications as prescribed Self administers injectable DM medication [name]) as prescribed Attends all scheduled provider appointments - check blood sugar at prescribed times - check blood sugar before and after exercise - check blood sugar if I feel it is too high or too low - enter blood sugar readings and medication or insulin into daily log - take the blood sugar log to all doctor visits - take the blood sugar meter to all doctor visits  Follow Up Plan: Telephone follow up appointment with care management team member scheduled for: 02/11/20     Patient Care Plan: Urinary Incontinence (Adult)    Problem Identified: Disorder Identification (Urinary Incontinence)   Priority: High    Goal: Urinary Incontinence Identified   Start Date: 01/06/2020  Expected End Date: 02/06/2020  This Visit's Progress: On track  Priority: High  Note:   Current Barriers:    Ineffective Self Health Maintenance  Currently UNABLE TO independently self manage needs related to chronic health conditions.   Knowledge Deficits related to short term plan for care coordination needs and long term plans for chronic disease management needs Nurse Case Manager Clinical Goal(s):   Over the next 30 days, patient will work with care management team to address care coordination and chronic disease management needs related to Disease Management  Educational Needs  Care Coordination  Medication Management and Education  Medication Assistance   Psychosocial Support   Interventions:   Determine severity and nature of incontinence, such as stress, urge or mixed incontinence) to assist in definitive diagnosis.   Determined patient is scheduled to undergo a TURP per Dr. Abner Greenspan, Urologist on 1/7/3f  Determined patient is able to verbalize a good understanding about this procedure and what to expect post surgery  Determined patient understands he should stop his ASA 5 days prior to the procedure  per Dr. Abner Greenspan Patient Goals/Self Care Activities:  Over the next 30 days, patient will:  -undergo a transurethral resection of the prostate as scheduled, 01/30/20 -hold ASA 5 days prior to this procedure as directed by Urologist   Follow Up Plan: Telephone follow up appointment with care management team member scheduled for: 02/11/20       Patient Care Plan: CCM Pharmacy Care Plan    Problem Identified: HTN, HLD, DM II   Priority: High    Long-Range Goal: Disease Management   Recent Progress: On track  Priority: High  Note:    Current Barriers:  . Unable to independently afford treatment regimen . Unable to achieve control of Diabetes    Pharmacist Clinical Goal(s):  [name] Over the next 90 days, patient will verbalize ability to afford treatment regimen . achieve adherence to monitoring guidelines and medication adherence to achieve therapeutic efficacy through  collaboration with PharmD and provider.    Interventions: . 1:1 collaboration with [name], MD regarding development and update of comprehensive plan of care as evidenced by provider attestation and co-signature . Inter-disciplinary care team collaboration (see longitudinal plan of care) . Comprehensive medication review performed; medication list updated in electronic medical record  Hypertension (BP goal <130/80) -Controlled -Current treatment: . Metoprolol succinate 50 mg tablet once per day  . Enalapril 10 mg tablet daily  -Current home readings: he is checking his BP everyday, he is using a wrist BP cuff and his current readings are : 120/64, 118/65, 119/63, 123/63, 117/66, 103/65, 129/67 -Denies hypotensive/hypertensive symptoms -Educated on BP goals and benefits of medications for prevention of heart attack, stroke and kidney damage; Importance of home blood pressure monitoring; Symptoms of hypotension and importance of maintaining adequate hydration; -Counseled to monitor BP at home at least once per day, document, and provide log at future appointments -Recommended to continue current medication  Diabetes (A1c goal <8%) -Uncontrolled -Current medications: . [name] Flextouch: Inject 0.4 ml into the skin daily.  o Patient is receiving patient assistance through [employer] through 12/22/2020.  o Currently has 3 pens left  o Patient needs a reorder reminder for patients medication.  -Medications previously tried: Jardiance-issues with yeast infection, Kombiglyze XR- did not work, Research officer, [employer] - bad reaction-was not able to eat, bad nausea.  -Current home glucose readings . fasting glucose: [phone] (Patient reports he has not had any low blood sugars)  -Denies hypoglycemic/hyperglycemic symptoms -Current meal patterns:  . breakfast: oatmeal, eggs, staying away from bread, patient reports cheating on his meal plan sometimes . drinks: goal: drinking plenty of  water  -Current exercise: gardening -Patient is going to start checking his glucose 2 hours after his first meal which is the largest meal of the day, patient reports he has 3 boxes of 50.  -Educated on A1c and blood sugar goals; Exercise goal of 150 minutes per week; Prevention and management of hypoglycemic episodes;  -Patient verbalized understanding of hypoglycemia, and he knows how to treat it.  -Counseled to check feet daily and get yearly eye exams -Recommended to continue current medication -Health concierge to check on refill status of patients [place].  -Collaborate with PCP to have patients glucose test strip prescription changed to twice per day.   Atherosclerosis/Hyperlipidemia: (LDL goal < 70) -Uncontrolled -Current treatment: . Simvastatin 40 mg tablet once per day  -Current dietary patterns: patient reports that he is not eating a lot of fried and fatty foods.  -Current exercise habits: patient reports gardening.  -Patient reports that  he is eating more healthy adding avocado to his meal.  -Educated on Cholesterol goals;  Benefits of statin for ASCVD risk reduction; Importance of limiting foods high in cholesterol; -Recommended to continue current medication  -Recommend that patient have lab work completed in the morning as a fasting lab will collaborate with PCP team to determine scheduling options .   Health Maintenance -Current therapy:  . Coenzyme Q10 taking 1 tablet daily  . Melatonin 1 mg tablet daily  -Educated on Herbal supplement research is limited and benefits usually cannot be proven Supplements may interfere with prescription drugs -Patient is satisfied with current therapy and denies issues   Patient Goals/Self-Care Activities . Over the next 30 days, patient will:  - take medications as prescribed focus on medication adherence by taking his medication at the same time each day.  -check glucose twice per day, document, and provide at future  appointments  Follow Up Plan:  Telephone follow up appointment with care management team member scheduled for: 07/07/2020 The patient has been provided with contact information for the care management team and has been advised to call with any health related questions or concerns.       Patient agreed to services and verbal consent obtained.   The patient verbalized understanding of instructions, educational materials, and care plan provided today and agreed to receive a mailed copy of patient instructions, educational materials, and care plan.   [name], PharmD Clinical Pharmacist [facility] [phone]

## 2020-06-16 NOTE — Chronic Care Management (AMB) (Signed)
[place] to confirm [place] shipment. Spoke with Sunday Spillers, [employer], and she stated the medication was processed on 06-02-2020 and takes 10-14 business days. The estimated delivery day is 06-22-2020. Informed [name] to relay message since he has a visit today.   [place] Pharmacist Assistant [phone]

## 2020-06-23 ENCOUNTER — Telehealth: Payer: Self-pay

## 2020-06-23 NOTE — Telephone Encounter (Signed)
Attempted to call pt, mailbox full. Pt assistance ready for pickup.

## 2020-07-02 ENCOUNTER — Telehealth: Payer: Self-pay

## 2020-07-02 ENCOUNTER — Other Ambulatory Visit: Payer: Self-pay | Admitting: Nurse Practitioner

## 2020-07-02 DIAGNOSIS — E785 Hyperlipidemia, unspecified: Secondary | ICD-10-CM

## 2020-07-02 NOTE — Progress Notes (Addendum)
Called patient to inform him that his [name] is ready to be picked up at the office. No answer on both lines and was unable to leave a voice mail.  [place] Pharmacist Assistant [phone] I have reviewed the care management and care coordination activities outlined in this encounter and I am certifying that I agree with the content of this note. No further action required.  Mayford Knife, [facility] 07/07/20 4:14 PM

## 2020-07-06 ENCOUNTER — Ambulatory Visit: Payer: Medicare HMO | Admitting: Nurse Practitioner

## 2020-07-06 ENCOUNTER — Telehealth: Payer: Self-pay

## 2020-07-06 NOTE — Progress Notes (Signed)
  Patient's wife states patient is aware of telephone appointment with [name] CPP on 07-07-2020 at 2:00. Patient aware to have/bring all medications, supplements, blood pressure and/or blood sugar logs to visit.  [place] Pharmacist Assistant [phone]

## 2020-07-07 ENCOUNTER — Ambulatory Visit (INDEPENDENT_AMBULATORY_CARE_PROVIDER_SITE_OTHER): Payer: Medicare HMO

## 2020-07-07 ENCOUNTER — Other Ambulatory Visit: Payer: Self-pay

## 2020-07-07 DIAGNOSIS — I10 Essential (primary) hypertension: Secondary | ICD-10-CM

## 2020-07-07 DIAGNOSIS — Z794 Long term (current) use of insulin: Secondary | ICD-10-CM

## 2020-07-07 DIAGNOSIS — E1165 Type 2 diabetes mellitus with hyperglycemia: Secondary | ICD-10-CM

## 2020-07-07 DIAGNOSIS — E782 Mixed hyperlipidemia: Secondary | ICD-10-CM

## 2020-07-07 NOTE — Progress Notes (Signed)
Chronic Care Management Pharmacy Note  07/15/2020 Name:  Gregory Friedman MRN:  [id number] DOB:  [birth year]  Summary: Patient reports his BS are more controlled   Recommendations/Changes made from today's visit: Recommend patient receive COVID-19 vaccine   Plan: Patient is going to continue taking his medication and checking his BP at least twice per day    Subjective: Gregory Friedman is an 70 y.o. year old male who is a primary patient of Minette Brine, [place].  The CCM team was consulted for assistance with disease management and care coordination needs.    Engaged with patient face to face for follow up visit in response to provider referral for pharmacy case management and/or care coordination services.   Consent to Services:  The patient was given information about Chronic Care Management services, agreed to services, and gave verbal consent prior to initiation of services.  Please see initial visit note for detailed documentation.   Patient Care Team: Minette Brine, [place] as PCP - General (General Practice) Fay Records, MD as PCP - Cardiology (Cardiology) Rozetta Nunnery, MD as Attending Physician (Otolaryngology) Eppie Gibson, MD as Attending Physician (Radiation Oncology) Fay Records, MD as Attending Physician (Cardiology) Inda Castle, MD (Inactive) as Attending Physician (Gastroenterology) Lynne Logan, RN as Case Manager Edythe Clarity, [facility] (Pharmacist) Mayford Knife, [facility] (Pharmacist)  Recent office visits: 04/01/2020 PCP OV   Recent consult visits: 04/28/2020 Otolaryngology   Hospital visits: None in previous 6 months   Objective:  Lab Results  Component Value Date   CREATININE 1.41 (H) 04/01/2020   BUN 12 04/01/2020   GFRNONAA 47 (L) 01/31/2020   GFRAA 50 (L) 01/01/2020   NA 140 04/01/2020   K 4.2 04/01/2020   CALCIUM 9.7 04/01/2020   CO2 24 04/01/2020   GLUCOSE 226 (H) 04/01/2020    Lab Results  Component Value  Date/Time   HGBA1C 9.0 (H) 04/01/2020 12:40 PM   HGBA1C 8.4 (H) 01/01/2020 02:57 PM   MICROALBUR 10 10/31/2018 10:57 AM   MICROALBUR 1.0 04/19/2015 12:01 AM   MICROALBUR 0.2 05/14/2014 12:01 AM    Last diabetic Eye exam:  Lab Results  Component Value Date/Time   HMDIABEYEEXA No Retinopathy 09/14/2015 12:00 AM    Last diabetic Foot exam: No results found for: HMDIABFOOTEX   Lab Results  Component Value Date   CHOL 167 04/01/2020   HDL 46 04/01/2020   LDLCALC 90 04/01/2020   TRIG 179 (H) 04/01/2020   CHOLHDL 3.6 04/01/2020    Hepatic Function Latest Ref Rng & Units 04/01/2020 01/01/2020 09/18/2019  Total Protein 6.0 - 8.5 g/dL 8.0 7.7 7.9  Albumin 3.8 - 4.8 g/dL 4.8 4.8 5.0(H)  AST 0 - 40 IU/L 13 20 20   ALT 0 - 44 IU/L 12 14 16   Alk Phosphatase 44 - 121 IU/L 88 75 61  Total Bilirubin 0.0 - 1.2 mg/dL 0.4 0.4 0.7  Bilirubin, Direct 0.0 - 0.3 mg/dL - - -    Lab Results  Component Value Date/Time   TSH 2.500 01/01/2020 02:57 PM   TSH 4.080 10/31/2018 12:15 PM   FREET4 1.13 10/31/2018 12:15 PM   FREET4 1.46 04/25/2018 12:18 PM    CBC Latest Ref Rng & Units 04/01/2020 01/31/2020 01/30/2020  WBC 3.4 - 10.8 x10E3/uL 5.7 6.3 -  Hemoglobin 13.0 - 17.7 g/dL 13.2 11.9(L) 14.6  Hematocrit 37.5 - 51.0 % 41.3 36.5(L) 43.0  Platelets 150 - 450 x10E3/uL 200 130(L) -    Lab Results  Component Value Date/Time   VD25OH 76.0 04/17/2019 03:17 PM    Clinical ASCVD: No  The 10-year ASCVD risk score Mikey Bussing DC Jr., et al., 2013) is: 42.5%   Values used to calculate the score:     Age: 31 years     Sex: Male     Is Non-Hispanic African American: No     Diabetic: Yes     Tobacco smoker: No     Systolic Blood Pressure: 034 mmHg     Is BP treated: Yes     HDL Cholesterol: 46 mg/dL     Total Cholesterol: 167 mg/dL    Depression screen [facility] 2/9 04/01/2020 04/01/2020 04/01/2020  Decreased Interest 0 0 0  Down, Depressed, Hopeless 0 0 0  PHQ - 2 Score 0 0 0  Altered sleeping - 0 -  Tired,  decreased energy - 0 -  Change in appetite - 0 -  Feeling bad or failure about yourself  - 0 -  Trouble concentrating - 0 -  Moving slowly or fidgety/restless - 0 -  Suicidal thoughts - 0 -  PHQ-9 Score - 0 -  Difficult doing work/chores - - -  Some recent data might be hidden     Social History   Tobacco Use  Smoking Status Never  Smokeless Tobacco Never   BP Readings from Last 3 Encounters:  04/01/20 128/70  02/24/20 (!) 142/82  01/31/20 120/72   Pulse Readings from Last 3 Encounters:  04/01/20 (!) 51  02/24/20 66  01/31/20 (!) 55   Wt Readings from Last 3 Encounters:  04/01/20 192 lb (87.1 kg)  04/01/20 192 lb 3.2 oz (87.2 kg)  01/30/20 193 lb 9.6 oz (87.8 kg)   BMI Readings from Last 3 Encounters:  04/01/20 30.25 kg/m  04/01/20 30.28 kg/m  01/30/20 29.44 kg/m    Assessment/Interventions: Review of patient past medical history, allergies, medications, health status, including review of consultants reports, laboratory and other test data, was performed as part of comprehensive evaluation and provision of chronic care management services.   SDOH:  (Social Determinants of Health) assessments and interventions performed: No  SDOH Screenings   Alcohol Screen: Not on file  Depression (PHQ2-9): Low Risk    PHQ-2 Score: 0  Financial Resource Strain: Low Risk    Difficulty of Paying Living Expenses: Not hard at all  Food Insecurity: No Food Insecurity   Worried About Charity fundraiser in the Last Year: Never true   Ran Out of Food in the Last Year: Never true  Housing: Not on file  Physical Activity: Insufficiently Active   Days of Exercise per Week: 7 days   Minutes of Exercise per Session: 20 min  Social Connections: Not on file  Stress: No Stress Concern Present   Feeling of Stress : Not at all  Tobacco Use: Low Risk    Smoking Tobacco Use: Never   Smokeless Tobacco Use: Never  Transportation Needs: No Transportation Needs   Lack of Transportation  (Medical): No   Lack of Transportation (Non-Medical): No    CCM Care Plan  No Known Allergies  Medications Reviewed Today     Reviewed by Mayford Knife, RPH (Pharmacist) on 07/07/20 at 1440  Med List Status: <None>   Medication Order Taking? Sig Documenting Provider Last Dose Status Informant  Accu-Chek Softclix Lancets lancets [id number] No USE AS DIRECTED [name], MD Taking Active   Alcohol Swabs (B-D SINGLE USE SWABS REGULAR) PADS [id number] No Use as directed  to check blood sugars E11.65 Minette Brine, FNP Taking Active   aspirin EC 81 MG tablet [id number] No Take 81 mg by mouth at bedtime. [provider] Taking Active            Med Note Pricilla Holm Feb 05, 2020  2:20 PM) Wait to start to taking ASA 81 after surgery, will be seen on 02/23/19  Blood Glucose Calibration (ACCU-CHEK GUIDE CONTROL) LIQD [id number] No Use as directed E11.65 Minette Brine, FNP Taking Active   Blood Glucose Monitoring Suppl (ACCU-CHEK GUIDE) w/Device KIT [id number] No Use to check blood sugars daily E11.65 Minette Brine, FNP Taking Active   Coenzyme Q10 200 MG capsule [id number] No Take 200 mg by mouth at bedtime.  [provider] Taking Active Self  docusate sodium (COLACE) 100 MG capsule [id number] No Take 1 capsule (100 mg total) by mouth daily as needed for up to 30 doses.  Patient not taking: Reported on 06/16/2020   Janith Lima, MD Not Taking Active   enalapril (VASOTEC) 10 MG tablet [id number] No Take 1 tablet (10 mg total) by mouth daily. [name], MD Taking Active   insulin degludec [facility]) 100 UNIT/ML FlexTouch Pen [id number] No Inject 0.4 mLs (40 Units total) into the skin daily. 55 unites per day  Patient taking differently: Inject 40 Units into the skin at bedtime.   Shamleffer, Melanie Crazier, MD Taking Active Self  Insulin Pen Needle 32G X 4 MM MISC [id number] No 1 Device by Does not apply route in the morning, at noon, in the evening, and  at bedtime. Shamleffer, Melanie Crazier, MD Taking Active   meclizine (ANTIVERT) 25 MG tablet [id number] No Take 1 tablet (25 mg total) by mouth 3 (three) times daily as needed. [name], MD Taking Active   melatonin 1 MG TABS tablet [id number] No Take 1 mg by mouth at bedtime.  Patient not taking: Reported on 06/16/2020   [provider] Not Taking Active   metoprolol succinate (TOPROL-XL) 50 MG 24 hr tablet [id number] No Take 1 tablet (50 mg total) by mouth at bedtime. Fay Records, MD Taking Active   Misc Natural Products [facility] ADVANCED PO) [id number] No Take by mouth at bedtime. [provider] Taking Active   nitroGLYCERIN (NITROSTAT) 0.4 MG SL tablet [id number] No Place 1 tablet (0.4 mg total) under the tongue every 5 (five) minutes as needed. Chest pain Fay Records, MD Taking Active Self           Med Note Burnard Leigh   Wed Jan 28, 2020 12:08 PM) Per pt last taken nitro 01/ 2020  omeprazole (PRILOSEC) 40 MG capsule [id number] No TAKE 1 CAPSULE EVERY DAY BEFORE A MEAL  Patient not taking: Reported on 06/16/2020   [name], MD Not Taking Active   oxyCODONE-acetaminophen (PERCOCET) 5-325 MG tablet [id number] No Take 1 tablet by mouth every 4 (four) hours as needed for up to 12 doses for severe pain.  Patient not taking: Reported on 06/16/2020   Janith Lima, MD Not Taking Active   simvastatin (ZOCOR) 40 MG tablet [id number]  TAKE 1 TABLET EVERY Wenda Low, FNP  Active   triamcinolone cream (KENALOG) 0.1 % [id number] No Apply 1 application topically 3 (three) times daily. Apply to rash bid x 7-10 days, then prn when rash return [place], Sunday Spill[place], [place] Taking Active Self            Patient Active Problem List  Diagnosis Date Noted   BPH (benign prostatic hyperplasia) 01/30/2020   Low back pain 06/11/2019   Type 2 diabetes mellitus with hyperglycemia, with long-term current use of insulin ([place]) 05/23/2019   Type 2 diabetes  mellitus with stage 3a chronic kidney disease, with long-term current use of insulin ([place]) 05/23/2019   Dyslipidemia 05/23/2019   Psoriasis 12/12/2018   Skin lesion 10/31/2018   Diabetes mellitus type 2, insulin dependent ([place]) 09/12/2018   Elevated serum creatinine 09/12/2018   Hypothyroidism 10/10/2017   Atherosclerosis of aorta ([place]) 10/10/2017   Unspecified skin changes 10/10/2017   Other long term (current) drug therapy 10/10/2017   Abdominal pain 09/21/2017   Nausea & vomiting 09/21/2017   History of head and neck cancer 09/21/2017   History of colonic polyps 04/04/2016   Dysphagia 04/04/2016   Bloating 04/04/2016   Diarrhea 04/04/2016   Trigger finger of right thumb 08/23/2015   Chest pain 07/15/2015   Pain in the chest    S/P CABG (coronary artery bypass graft)    Noncompliance with medications 07/08/2015   Acute gout 04/19/2015   GERD (gastroesophageal reflux disease) 05/14/2014   Depression, major, in remission ([place]) 03/27/2013   Allergic rhinitis, cause unspecified 03/27/2013   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   Depressive disorder, not elsewhere classified 12/06/2011   Nonspecific (abnormal) findings on radiological and other examination of gastrointestinal tract 02/10/2011   Dysphagia, unspecified(787.20) 02/10/2011   Palpitations 02/02/2011   Dizziness 07/15/2010   DIAB W/OPHTH MANIFESTS TYPE I [JUV TYPE] UNCNTRL 08/02/2009   Mixed hyperlipidemia 08/02/2009   Essential hypertension 08/02/2009   CAD (coronary artery disease) 08/02/2009    Immunization History  Administered Date(s) Administered   Influenza Split 11/24/2010, 12/06/2011   Influenza, High Dose Seasonal PF 11/17/2014, 10/23/2017, 12/05/2018   Influenza,inj,Quad PF,6+ Mos 11/25/2012, 01/01/2014   Pneumococcal Conjugate-13 01/07/2015   Pneumococcal Polysaccharide-23 10/21/2009   Tdap 05/27/2009    Conditions to be addressed/monitored:  Hypertension, Hyperlipidemia, and  Diabetes  Care Plan : [place]  Updates made by Mayford Knife, [place] since 07/15/2020 12:00 AM     Problem: HTN, HLD, DM II   Priority: High     Long-Range Goal: Disease Management   Recent Progress: On track  Priority: High  Note:    Current Barriers:  Unable to independently afford treatment regimen Unable to achieve control of Diabetes    Pharmacist Clinical Goal(s):  Over the next 90 days, patient will verbalize ability to afford treatment regimen achieve adherence to monitoring guidelines and medication adherence to achieve therapeutic efficacy through collaboration with PharmD and provider.    Interventions: 1:1 collaboration with [name], MD regarding development and update of comprehensive plan of care as evidenced by provider attestation and co-signature Inter-disciplinary care team collaboration (see longitudinal plan of care) Comprehensive medication review performed; medication list updated in electronic medical record  Hypertension (BP goal <130/80) -Controlled -Current treatment: Metoprolol succinate 50 mg tablet once per day  Enalapril 10 mg tablet daily  -Current home readings: he is checking his BP everyday, he is using a wrist BP cuff and his current readings are : 120/64, 118/65, 119/63, 123/63, 117/66, 103/65, 129/67 -Denies hypotensive/hypertensive symptoms -Educated on BP goals and benefits of medications for prevention of heart attack, stroke and kidney damage; Importance of home blood pressure monitoring; Symptoms of hypotension and importance of maintaining adequate hydration; -Counseled to monitor BP at home at least once per day, document, and provide log at future appointments -Recommended  to continue current medication  Diabetes (A1c goal <8%) -Uncontrolled -Current medications: [name] Flextouch: Inject 0.4 ml into the skin daily.  Patient is receiving patient assistance through [employer] through  12/22/2020.  Patient needs a reorder reminder for patients medication.  -Medications previously tried: Jardiance-issues with yeast infection, Kombiglyze XR- did not work, Research officer, trade [employer] - bad reaction-was not able to eat, bad nausea.  -Current home glucose readings fasting glucose: [phone] (Patient reports he has not had any low blood sugars)  -Denies hypoglycemic/hyperglycemic symptoms -Current meal patterns:  breakfast: oatmeal, eggs, staying away from bread, patient reports cheating on his meal plan sometimes drinks: goal: drinking plenty of water  -Current exercise: gardening -Patient is going to start checking his glucose 2 hours after his first meal which is the largest meal of the day -Educated on A1c and blood sugar goals; Exercise goal of 150 minutes per week; Prevention and management of hypoglycemic episodes;  -Patient verbalized understanding of hypoglycemia, and he knows how to treat it.  -Counseled to check feet daily and get yearly eye exams -Recommended to continue current medication -Collaborate with PCP to have patients glucose test strip prescription changed to twice per day.   Atherosclerosis/Hyperlipidemia: (LDL goal < 70) -Uncontrolled -Current treatment: Simvastatin 40 mg tablet once per day  -Current dietary patterns: patient reports that he is not eating a lot of fried and fatty foods.  -Current exercise habits: patient reports gardening.  -Patient reports that he is eating more healthy adding avocado to his meal.  -Educated on Cholesterol goals;  Benefits of statin for ASCVD risk reduction; Importance of limiting foods high in cholesterol; -Recommended to continue current medication  -Recommend that patient have lab work completed in the morning as a fasting lab will collaborate with PCP team to determine scheduling options .   Health Maintenance -Current therapy:  Coenzyme Q10 taking 1 tablet daily  Melatonin 1 mg tablet daily  -Educated on Herbal  supplement research is limited and benefits usually cannot be proven Supplements may interfere with prescription drugs -Patient is satisfied with current therapy and denies issues   Patient Goals/Self-Care Activities Over the next 30 days, patient will:  - take medications as prescribed focus on medication adherence by taking his medication at the same time each day.  -check glucose twice per day, document, and provide at future appointments  Follow Up Plan:  Telephone follow up appointment with care management team member scheduled for: 07/07/2020 The patient has been provided with contact information for the care management team and has been advised to call with any health related questions or concerns.       Medication Assistance:  [name] obtained through [place] medication assistance program.  Enrollment ends 12/2020.  Compliance/Adherence/Medication fill history: Care Gaps: COVID-19 Vaccination Shingrix Vaccine Pneumonia Vaccine Opthamology Exam  Star-Rating Drugs: Simvastatin 40 mg  Enalapril 10 mg  Patient's preferred pharmacy is:  [facility] [place], [place] - [address] AT [place] [address] Phone: [phone] Fax: [phone]  [facility] - [place], [place] [address] Phone: [phone] Fax: [phone]  Uses pill box? Yes Pt endorses 95% compliance  We discussed: Benefits of medication synchronization, packaging and delivery as well as enhanced pharmacist oversight with [employer]. Patient decided to: Continue current medication management strategy  Care Plan and Follow Up Patient Decision:  Patient  agrees to Care Plan and Follow-up.  Plan: The patient has been provided with contact information for the care management team and has been advised to call with any health related questions  or concerns.   [name], PharmD Clinical Pharmacist [facility] [phone]

## 2020-07-08 ENCOUNTER — Other Ambulatory Visit: Payer: Self-pay | Admitting: Nurse Practitioner

## 2020-07-08 DIAGNOSIS — E1165 Type 2 diabetes mellitus with hyperglycemia: Secondary | ICD-10-CM

## 2020-07-15 NOTE — Patient Instructions (Addendum)
Visit Information It was great speaking with you today!  Please let me know if you have any questions about our visit.   Goals Addressed             This Visit's Progress    Manage My Medicine       Timeframe:  Long-Range Goal Priority:  High Start Date:                             Expected End Date:                       Follow Up Date: 10/13/2020    - call for medicine refill 2 or 3 days before it runs out - call if I am sick and can't take my medicine - use an alarm clock or phone to remind me to take my medicine    Why is this important?   These steps will help you keep on track with your medicines.          Patient Care Plan: CCM Pharmacy Care Plan     Problem Identified: HTN, HLD, DM II   Priority: High     Long-Range Goal: Disease Management   Recent Progress: On track  Priority: High  Note:    Current Barriers:  Unable to independently afford treatment regimen Unable to achieve control of Diabetes    Pharmacist Clinical Goal(s):  Over the next 90 days, patient will verbalize ability to afford treatment regimen achieve adherence to monitoring guidelines and medication adherence to achieve therapeutic efficacy through collaboration with PharmD and provider.    Interventions: 1:1 collaboration with [name], MD regarding development and update of comprehensive plan of care as evidenced by provider attestation and co-signature Inter-disciplinary care team collaboration (see longitudinal plan of care) Comprehensive medication review performed; medication list updated in electronic medical record  Hypertension (BP goal <130/80) -Controlled -Current treatment: Metoprolol succinate 50 mg tablet once per day  Enalapril 10 mg tablet daily  -Current home readings: he is checking his BP everyday, he is using a wrist BP cuff and his current readings are : 120/64, 118/65, 119/63, 123/63, 117/66, 103/65, 129/67 -Denies hypotensive/hypertensive  symptoms -Educated on BP goals and benefits of medications for prevention of heart attack, stroke and kidney damage; Importance of home blood pressure monitoring; Symptoms of hypotension and importance of maintaining adequate hydration; -Counseled to monitor BP at home at least once per day, document, and provide log at future appointments -Recommended to continue current medication  Diabetes (A1c goal <8%) -Uncontrolled -Current medications: [name] Flextouch: Inject 0.4 ml into the skin daily.  Patient is receiving patient assistance through [employer] through 12/22/2020.  Patient needs a reorder reminder for patients medication.  -Medications previously tried: Jardiance-issues with yeast infection, Kombiglyze XR- did not work, Research officer, trade union - bad reaction-was not able to eat, bad nausea.  -Current home glucose readings fasting glucose: [phone] (Patient reports he has not had any low blood sugars)  -Denies hypoglycemic/hyperglycemic symptoms -Current meal patterns:  breakfast: oatmeal, eggs, staying away from bread, patient reports cheating on his meal plan sometimes drinks: goal: drinking plenty of water  -Current exercise: gardening -Patient is going to start checking his glucose 2 hours after his first meal which is the largest meal of the day -Educated on A1c and blood sugar goals; Exercise goal of 150 minutes per week; Prevention and management of hypoglycemic episodes;  -Patient verbalized understanding  of hypoglycemia, and he knows how to treat it.  -Counseled to check feet daily and get yearly eye exams -Recommended to continue current medication -Collaborate with PCP to have patients glucose test strip prescription changed to twice per day.   Atherosclerosis/Hyperlipidemia: (LDL goal < 70) -Uncontrolled -Current treatment: Simvastatin 40 mg tablet once per day  -Current dietary patterns: patient reports that he is not eating a lot of fried and fatty foods.   -Current exercise habits: patient reports gardening.  -Patient reports that he is eating more healthy adding avocado to his meal.  -Educated on Cholesterol goals;  Benefits of statin for ASCVD risk reduction; Importance of limiting foods high in cholesterol; -Recommended to continue current medication  -Recommend that patient have lab work completed in the morning as a fasting lab will collaborate with PCP team to determine scheduling options .   Health Maintenance -Current therapy:  Coenzyme Q10 taking 1 tablet daily  Melatonin 1 mg tablet daily  -Educated on Herbal supplement research is limited and benefits usually cannot be proven Supplements may interfere with prescription drugs -Patient is satisfied with current therapy and denies issues   Patient Goals/Self-Care Activities Over the next 30 days, patient will:  - take medications as prescribed focus on medication adherence by taking his medication at the same time each day.  -check glucose twice per day, document, and provide at future appointments  Follow Up Plan:  Telephone follow up appointment with care management team member scheduled for: 10/13/2020 The patient has been provided with contact information for the care management team and has been advised to call with any health related questions or concerns.        Patient agreed to services and verbal consent obtained.   The patient verbalized understanding of instructions, educational materials, and care plan provided today and agreed to receive a mailed copy of patient instructions, educational materials, and care plan.   [name], PharmD Clinical Pharmacist [facility] [phone]

## 2020-07-21 ENCOUNTER — Telehealth: Payer: Medicare HMO

## 2020-07-21 ENCOUNTER — Ambulatory Visit: Payer: Self-pay

## 2020-07-21 DIAGNOSIS — F325 Major depressive disorder, single episode, in full remission: Secondary | ICD-10-CM

## 2020-07-21 DIAGNOSIS — E782 Mixed hyperlipidemia: Secondary | ICD-10-CM | POA: Diagnosis not present

## 2020-07-21 DIAGNOSIS — I10 Essential (primary) hypertension: Secondary | ICD-10-CM | POA: Diagnosis not present

## 2020-07-21 DIAGNOSIS — F5101 Primary insomnia: Secondary | ICD-10-CM

## 2020-07-21 DIAGNOSIS — E1165 Type 2 diabetes mellitus with hyperglycemia: Secondary | ICD-10-CM | POA: Diagnosis not present

## 2020-07-21 DIAGNOSIS — N400 Enlarged prostate without lower urinary tract symptoms: Secondary | ICD-10-CM

## 2020-07-21 DIAGNOSIS — Z794 Long term (current) use of insulin: Secondary | ICD-10-CM

## 2020-07-21 NOTE — Patient Instructions (Signed)
Goals Addressed      Follow My Treatment Plan-Chronic Kidney   On track    Timeframe:  Long-Range Goal Priority:  High Start Date: 01/06/20                            Expected End Date:  01/05/21                    Follow Up Date: 08/27/20   Self Care Activities:  Continue to adhere to MD recommendations for CKD  Continue to keep all scheduled follow up appointments Take medications as directed  Let your healthcare team know if you are unable to take your medications Call your pharmacy for refills at least 7 days prior to running out of medication Increase your water intake unless otherwise directed Patient Goals: - maintain and or improve adequate kidney function   Why is this important?   Staying as healthy as you can is very important. This may mean making changes if you smoke, don't exercise or eat poorly.  A healthy lifestyle is an important goal for you.  Following the treatment plan and making changes may be hard.  Try some of these steps to help keep the disease from getting worse.     Notes:       Monitor and Manage My Blood Sugar-Diabetes Type 2   On track    Timeframe:  Long-Range Goal Priority:  High Start Date: 01/06/20                            Expected End Date: 01/05/21                      Follow Up Date: 08/27/20    - check blood sugar at prescribed times - check blood sugar before and after exercise - check blood sugar if I feel it is too high or too low - enter blood sugar readings and medication or insulin into daily log - take the blood sugar log to all doctor visits - take the blood sugar meter to all doctor visits    Why is this important?   Checking your blood sugar at home helps to keep it from getting very high or very low.  Writing the results in a diary or log helps the doctor know how to care for you.  Your blood sugar log should have the time, date and the results.  Also, write down the amount of insulin or other medicine that you take.   Other information, like what you ate, exercise done and how you were feeling, will also be helpful.     Notes:       COMPLETED: Pharmacy Care Plan       CARE PLAN ENTRY (see longitudinal plan of care for additional care plan information)  Current Barriers:  Chronic Disease Management support, education, and care coordination needs related to Hypertension, Hyperlipidemia, and Diabetes   Hypertension BP Readings from Last 3 Encounters:  06/26/19 134/82  06/11/19 140/90  05/23/19 (!) 144/82  Pharmacist Clinical Goal(s): Over the next 180 days, patient will work with PharmD and providers to maintain BP goal <130/80 Current regimen:  Enalapril 10mg  daily Metoprolol succinate 25mg  2 tablets daily Interventions: Provided dietary and exercise recommendations Patient self care activities - Over the next 180 days, patient will: Check BP daily, document, and provide at future appointments  Ensure daily salt intake < 2300 mg/day Exercise for 30 minutes daily 5 times per week  Hyperlipidemia Lab Results  Component Value Date/Time   [facility] 57 04/17/2019 03:17 PM  Pharmacist Clinical Goal(s): Over the next 90 days, patient will work with PharmD and providers to maintain LDL goal < 70 Current regimen:  Simvastatin 40mg  daily Interventions: Provided dietary and exercise recommendations Increase healthy fat intake, decrease fatty foods Recommend recheck thyroid hormones at next PCP appointment Patient self care activities - Over the next 90 days, patient will: Increase intake of healthy fats (Avocados, walnuts, flaxseed, etc) Limit intake of fatty foods, fired, foods, and junk foods Exercise at least 30 minutes 5 times a week  Diabetes Lab Results  Component Value Date/Time   HGBA1C 8.6 (H) 07/03/2019 04:22 PM   HGBA1C 9.4 (H) 03/27/2019 05:25 PM  Pharmacist Clinical Goal(s): Over the next 120 days, patient will work with PharmD and providers to achieve A1c goal <7% Current  regimen:  Tresiba 40 units daily at 8 pm  Interventions: Provided dietary and exercise recommendations (mailed handouts to patient) Discuss titration of Tresiba to 45 units daily and reassess in two weeks. Assisted with obtaining refill for Tresiba from [employer] patient assistance program Reviewed patient's most recent blood glucose readings Patient self care activities - Over the next 60 days, patient will: Check blood sugar once daily, document, and provide at future appointments Contact provider with any episodes of hypoglycemia Increase intake of lean protein Try PLATE method for meal planning  Medication management Pharmacist Clinical Goal(s): Over the next 90 days, patient will work with PharmD and providers to maintain optimal medication adherence Current pharmacy: [facility] Order Interventions Comprehensive medication review performed. Continue current medication management strategy Patient self care activities - Over the next 90 days, patient will: Focus on medication adherence by consider use of a pill box to organize medications Take medications as prescribed Report any questions or concerns to PharmD and/or provider(s)  Please see past updates related to this goal by clicking on the "Past Updates" button in the selected goal        COMPLETED: [place] (TURP)       Timeframe:  Short-Term Goal Priority:  High Start Date: 01/06/20                            Expected End Date: 02/06/20                     Follow up call: 02/11/20  Over the next 30 days, patient will:  -undergo a transurethral resection of the prostate as scheduled, 01/30/20 -hold ASA 5 days prior to this procedure as directed by Urologist        COMPLETED: Work with embedded Pharm D for drug cost assistance       Timeframe:  Short-Term Goal Priority:  High Start Date: 01/06/20                            Expected End Date:  03/08/20  Follow up date:  02/11/20  Over the next 90 days, patient will:  -consult with embedded Pharm D to discuss cost assistance for [name]  -patient will continue to take [place] as prescribed w/o missed doses -patient will ask PCP for [place] drug samples if needed

## 2020-07-21 NOTE — Chronic Care Management (AMB) (Signed)
Chronic Care Management   CCM RN Visit Note  07/21/2020 Name: Gregory Friedman MRN: [id number] DOB: 15-Sep-1950  Subjective: Gregory Friedman is a 70 y.o. year old male who is a primary care patient of Minette Brine, [place]. The care management team was consulted for assistance with disease management and care coordination needs.    Engaged with patient by telephone for follow up visit in response to provider referral for case management and/or care coordination services.   Consent to Services:  The patient was given information about Chronic Care Management services, agreed to services, and gave verbal consent prior to initiation of services.  Please see initial visit note for detailed documentation.   Patient agreed to services and verbal consent obtained.   Assessment: Review of patient past medical history, allergies, medications, health status, including review of consultants reports, laboratory and other test data, was performed as part of comprehensive evaluation and provision of chronic care management services.   SDOH (Social Determinants of Health) assessments and interventions performed: Yes, no acute challenges   CCM Care Plan  No Known Allergies  Outpatient Encounter Medications as of 07/21/2020  Medication Sig Note   Accu-Chek Softclix Lancets lancets USE AS DIRECTED    Alcohol Swabs (B-D SINGLE USE SWABS REGULAR) PADS USE AS DIRECTED TO CHECK BLOOD SUGAR    aspirin EC 81 MG tablet Take 81 mg by mouth at bedtime. 02/05/2020: Wait to start to taking ASA 81 after surgery, will be seen on 02/23/19   Blood Glucose Calibration (ACCU-CHEK GUIDE CONTROL) LIQD Use as directed E11.65    Blood Glucose Monitoring Suppl (ACCU-CHEK GUIDE) w/Device KIT Use to check blood sugars daily E11.65    Coenzyme Q10 200 MG capsule Take 200 mg by mouth at bedtime.     docusate sodium (COLACE) 100 MG capsule Take 1 capsule (100 mg total) by mouth daily as needed for up to 30 doses. (Patient not  taking: Reported on 06/16/2020)    enalapril (VASOTEC) 10 MG tablet Take 1 tablet (10 mg total) by mouth daily.    insulin degludec (TRESIBA FLEXTOUCH) 100 UNIT/ML FlexTouch Pen Inject 0.4 mLs (40 Units total) into the skin daily. 55 unites per day (Patient taking differently: Inject 40 Units into the skin at bedtime.)    Insulin Pen Needle 32G X 4 MM MISC 1 Device by Does not apply route in the morning, at noon, in the evening, and at bedtime.    meclizine (ANTIVERT) 25 MG tablet Take 1 tablet (25 mg total) by mouth 3 (three) times daily as needed.    melatonin 1 MG TABS tablet Take 1 mg by mouth at bedtime. (Patient not taking: Reported on 06/16/2020)    metoprolol succinate (TOPROL-XL) 50 MG 24 hr tablet Take 1 tablet (50 mg total) by mouth at bedtime.    Misc Natural Products (TART CHERRY ADVANCED PO) Take by mouth at bedtime.    nitroGLYCERIN (NITROSTAT) 0.4 MG SL tablet Place 1 tablet (0.4 mg total) under the tongue every 5 (five) minutes as needed. Chest pain 01/28/2020: Per pt last taken nitro 01/ 2020   omeprazole (PRILOSEC) 40 MG capsule TAKE 1 CAPSULE EVERY DAY BEFORE A MEAL (Patient not taking: Reported on 06/16/2020)    oxyCODONE-acetaminophen (PERCOCET) 5-325 MG tablet Take 1 tablet by mouth every 4 (four) hours as needed for up to 12 doses for severe pain. (Patient not taking: Reported on 06/16/2020)    simvastatin (ZOCOR) 40 MG tablet TAKE 1 TABLET EVERY EVENING    triamcinolone cream (  KENALOG) 0.1 % Apply 1 application topically 3 (three) times daily. Apply to rash bid x 7-10 days, then prn when rash return    No facility-administered encounter medications on file as of 07/21/2020.    Patient Active Problem List   Diagnosis Date Noted   BPH (benign prostatic hyperplasia) 01/30/2020   Low back pain 06/11/2019   Type 2 diabetes mellitus with hyperglycemia, with long-term current use of insulin ([place]) 05/23/2019   Type 2 diabetes mellitus with stage 3a chronic kidney disease, with  long-term current use of insulin ([place]) 05/23/2019   Dyslipidemia 05/23/2019   Psoriasis 12/12/2018   Skin lesion 10/31/2018   Diabetes mellitus type 2, insulin dependent ([place]) 09/12/2018   Elevated serum creatinine 09/12/2018   Hypothyroidism 10/10/2017   Atherosclerosis of aorta ([place]) 10/10/2017   Unspecified skin changes 10/10/2017   Other long term (current) drug therapy 10/10/2017   Abdominal pain 09/21/2017   Nausea & vomiting 09/21/2017   History of head and neck cancer 09/21/2017   History of colonic polyps 04/04/2016   Dysphagia 04/04/2016   Bloating 04/04/2016   Diarrhea 04/04/2016   Trigger finger of right thumb 08/23/2015   Chest pain 07/15/2015   Pain in the chest    S/P CABG (coronary artery bypass graft)    Noncompliance with medications 07/08/2015   Acute gout 04/19/2015   GERD (gastroesophageal reflux disease) 05/14/2014   Depression, major, in remission ([place]) 03/27/2013   Allergic rhinitis, cause unspecified 03/27/2013   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   Depressive disorder, not elsewhere classified 12/06/2011   Nonspecific (abnormal) findings on radiological and other examination of gastrointestinal tract 02/10/2011   Dysphagia, unspecified(787.20) 02/10/2011   Palpitations 02/02/2011   Dizziness 07/15/2010   DIAB W/OPHTH MANIFESTS TYPE I [JUV TYPE] UNCNTRL 08/02/2009   Mixed hyperlipidemia 08/02/2009   Essential hypertension 08/02/2009   CAD (coronary artery disease) 08/02/2009    Conditions to be addressed/monitored: Diabetes Mellitus, Insomnia, Depression, Benign prostatic hyperplasia  Care Plan : Chronic Kidney (Adult)  Updates made by Lynne Logan, RN since 07/21/2020 12:00 AM     Problem: Disease Progression   Priority: High     Long-Range Goal: Disease Progression Prevented or Minimized   Start Date: 01/06/2020  Expected End Date: 01/06/2020  Recent Progress: On track  Priority: High  Note:   Current Barriers:   Ineffective Self Health Maintenance  Clinical Goal(s):  Collaboration with Minette Brine, FNP regarding development and update of comprehensive plan of care as evidenced by provider attestation and co-signature Inter-disciplinary care team collaboration (see longitudinal plan of care) patient will work with care management team to address care coordination and chronic disease management needs related to Disease Management Educational Needs Care Coordination Medication Management and Education Medication Assistance  Psychosocial Support   Interventions:  07/21/20 completed successful outbound call with patient  Evaluation of current treatment plan related to CKD Stage III , self-management and patient's adherence to plan as established by provider. Collaboration with Minette Brine, FNP regarding development and update of comprehensive plan of care as evidenced by provider attestation       and co-signature Inter-disciplinary care team collaboration (see longitudinal plan of care) Provided education to patient about basic CKD disease process Review of patient status, including review of consultant's reports, relevant laboratory and other test results, and medications completed. Reviewed medications with patient and discussed importance of medication adherence Educated patient on importance of having adequate blood pressure and diabetes control to help prevent further decline  in kidney function; instructed on importance of increasing water to 64 oz daily unless otherwise directed  Discussed plans with patient for ongoing care management follow up and provided patient with direct contact information for care management team Self Care Activities:  Continue to adhere to MD recommendations for CKD  Continue to keep all scheduled follow up appointments Take medications as directed  Let your healthcare team know if you are unable to take your medications Call your pharmacy for refills at least 7  days prior to running out of medication Increase your water intake unless otherwise directed Patient Goals: - maintain and or improve adequate kidney function  Follow Up Plan: Telephone follow up appointment with care management team member scheduled for: 08/27/20    Care Plan : Wellness (Adult)  Updates made by Lynne Logan, RN since 07/21/2020 12:00 AM  Completed 07/21/2020   Problem: Medication Adherence (Wellness) Resolved 07/21/2020  Priority: High     Goal: Medication Adherence Maintained Completed 07/21/2020  Start Date: 01/06/2020  Expected End Date: 03/08/2020  Recent Progress: On track  Priority: High  Note:   Current Barriers:  Ineffective Self Health Maintenance Currently UNABLE TO independently self manage needs related to chronic health conditions.  Knowledge Deficits related to short term plan for care coordination needs and long term plans for chronic disease management needs Nurse Case Manager Clinical Goal(s):  Over the next 90 days, patient will work with care management team to address care coordination and chronic disease management needs related to Disease Management Educational Needs Care Coordination Medication Management and Education Medication Assistance  Psychosocial Support   Interventions:  07/21/20 completed successful outbound call with patient  Determined patient is engaged with embedded Pharm D and has been approved for financial assistance with cost of Tresiba  Assessed for medication adherence, patient reports adherence Instructed patient to take his medications as directed Instructed patient to call his pharmacy at least 7 days prior to running out of medication Patient Goals/Self Care Activities:  Over the next 90 days, patient will:  -consult with embedded Pharm D to discuss cost assistance for [name]  -patient will continue to take [place] as prescribed w/o missed doses -patient will ask PCP for [place] drug samples if needed        Follow Up Plan: No further follow up required: continue to engage with embedded Pharm D       Care Plan : Diabetes Type 2 (Adult)  Updates made by Lynne Logan, RN since 07/21/2020 12:00 AM     Problem: Glycemic Management (Diabetes, Type 2)      Long-Range Goal: Glycemic Management Optimized   Start Date: 01/06/2020  Expected End Date: 01/06/2020  Recent Progress: On track  Priority: High  Note:   Objective:  Lab Results  Component Value Date   HGBA1C 9.0 (H) 04/01/2020   Lab Results  Component Value Date   CREATININE 1.41 (H) 04/01/2020   CREATININE 1.58 (H) 01/31/2020   CREATININE 1.40 (H) 01/30/2020   Lab Results  Component Value Date   EGFR 54 (L) 04/01/2020   Current Barriers:  Knowledge Deficits related to basic Diabetes pathophysiology and self care/management Knowledge Deficits related to medications used for management of diabetes Financial Constraints Case Manager Clinical Goal(s):  Patient will demonstrate improved adherence to prescribed treatment plan for diabetes self care/management as evidenced by:  daily monitoring and recording of CBG  adherence to ADA/ carb modified diet exercise 5 days/week adherence to prescribed medication regimen Interventions:  07/21/20  completed successful call with patient  Collaboration with Minette Brine FNP regarding development and update of comprehensive plan of care as evidenced by provider attestation and co-signature Inter-disciplinary care team collaboration (see longitudinal plan of care) Provided education to patient about basic DM disease process Review of patient status, including review of consultant's reports, relevant laboratory and other test results, and medications completed. Reviewed medications with patient and discussed importance of medication adherence Educated patient on dietary and exercise recommendations; daily glycemic control FBS 80-130, <180 after meals;15'15' rule Advised patient, providing  education and rationale, to check cbg daily before meals and at bedtime and record, calling the CCM team and or PCP for findings outside established parameters Reviewed scheduled/upcoming provider appointments including: next PCP follow up appointment scheduled for 07/29/20 _0 :52 PM  Mailed patient educational materials related to Low Carb Protein Smoothies for Diabetes  Discussed plans with patient for ongoing care management follow up and provided patient with direct contact information for care management team Patient Goals/Self-Care Activities Self administers oral medications as prescribed Self administers injectable DM medication [name]) as prescribed Attends all scheduled provider appointments Checks blood sugars as prescribed and utilize hyper and hypoglycemia protocol as needed Adheres to prescribed ADA/carb modified  Follow Up Plan: Telephone follow up appointment with care management team member scheduled for: 08/27/20    Care Plan : Urinary Incontinence (Adult)  Updates made by Lynne Logan, RN since 07/21/2020 12:00 AM  Completed 07/21/2020   Problem: Disorder Identification (Urinary Incontinence) Resolved 07/21/2020  Priority: High     Goal: Urinary Incontinence Identified Completed 07/21/2020  Start Date: 01/06/2020  Expected End Date: 02/06/2020  Recent Progress: On track  Priority: High  Note:   Current Barriers:  Ineffective Self Health Maintenance Currently UNABLE TO independently self manage needs related to chronic health conditions.  Knowledge Deficits related to short term plan for care coordination needs and long term plans for chronic disease management needs Nurse Case Manager Clinical Goal(s):  Patient will work with care management team to address care coordination and chronic disease management needs related to Disease Management Educational Needs Care Coordination Medication Management and Education Medication Assistance  Psychosocial Support    Interventions:  07/21/20 completed successful outbound call with patient  Collaboration with Minette Brine FNP regarding development and update of comprehensive plan of care as evidenced by provider attestation and co-signature Inter-disciplinary care team collaboration (see longitudinal plan of care) Assessed for ongoing incontinence, such as stress, urge or mixed incontinence) to assist in definitive diagnosis  Determined patient completed a successful TURP (Transurethral resection of the prostate) per Dr. Abner Greenspan, Urologist on 01/30/20 Patient Goals/Self Care Activities:  -follow up with Urologist as directed   Follow Up Plan: Follow up with provider as directed      Plan:Telephone follow up appointment with care management team member scheduled for:  08/27/20  Barb Merino, RN, BSN, CCM Care Management Coordinator [facility]  Direct Phone: [phone]

## 2020-07-29 ENCOUNTER — Encounter: Payer: Self-pay | Admitting: Nurse Practitioner

## 2020-07-29 ENCOUNTER — Ambulatory Visit (INDEPENDENT_AMBULATORY_CARE_PROVIDER_SITE_OTHER): Payer: Medicare HMO | Admitting: Nurse Practitioner

## 2020-07-29 ENCOUNTER — Other Ambulatory Visit: Payer: Self-pay

## 2020-07-29 VITALS — BP 122/70 | HR 56 | Temp 98.0°F | Ht 66.0 in | Wt 194.4 lb

## 2020-07-29 DIAGNOSIS — E782 Mixed hyperlipidemia: Secondary | ICD-10-CM | POA: Diagnosis not present

## 2020-07-29 DIAGNOSIS — E1122 Type 2 diabetes mellitus with diabetic chronic kidney disease: Secondary | ICD-10-CM

## 2020-07-29 DIAGNOSIS — Z6831 Body mass index (BMI) 31.0-31.9, adult: Secondary | ICD-10-CM

## 2020-07-29 DIAGNOSIS — R61 Generalized hyperhidrosis: Secondary | ICD-10-CM | POA: Diagnosis not present

## 2020-07-29 DIAGNOSIS — Z794 Long term (current) use of insulin: Secondary | ICD-10-CM | POA: Diagnosis not present

## 2020-07-29 DIAGNOSIS — I10 Essential (primary) hypertension: Secondary | ICD-10-CM

## 2020-07-29 DIAGNOSIS — E6609 Other obesity due to excess calories: Secondary | ICD-10-CM

## 2020-07-29 DIAGNOSIS — I130 Hypertensive heart and chronic kidney disease with heart failure and stage 1 through stage 4 chronic kidney disease, or unspecified chronic kidney disease: Secondary | ICD-10-CM

## 2020-07-29 DIAGNOSIS — R1319 Other dysphagia: Secondary | ICD-10-CM

## 2020-07-29 DIAGNOSIS — N1831 Chronic kidney disease, stage 3a: Secondary | ICD-10-CM | POA: Diagnosis not present

## 2020-07-29 DIAGNOSIS — E66811 Obesity, class 1: Secondary | ICD-10-CM

## 2020-07-29 NOTE — Progress Notes (Signed)
I,Yamilka Roman [employer] as a Education administrator for [employer], FNP.,have documented all relevant documentation on the behalf of Minette Brine, FNP,as directed by  Minette Brine, FNP while in the presence of Minette Brine, [place].  This visit occurred during the SARS-CoV-2 public health emergency.  Safety protocols were in place, including screening questions prior to the visit, additional usage of staff PPE, and extensive cleaning of exam room while observing appropriate contact time as indicated for disinfecting solutions.  Subjective:     Patient ID: Gregory Friedman , male    DOB: 11-01-1950 , 70 y.o.   MRN: [id number]   Chief Complaint  Patient presents with   Diabetes   Hypertension   difficulty swallowing    He noticed he has been having some trouble swallowing pills or water. He stated a few years ago he had his esophagus expanded and he is not sure if he needs the procedure done again. He stated after the episode happens it makes his throat.     HPI  Pt presents today for a follow up on diabetes and blood pressure.  He will have sweating in the morning hours.    Wt Readings from Last 3 Encounters: 07/29/20 : 194 lb 6.4 oz (88.2 kg) 04/01/20 : 192 lb (87.1 kg) 04/01/20 : 192 lb 3.2 oz (87.2 kg)    Diabetes He presents for his follow-up diabetic visit. He has type 2 diabetes mellitus. There are no hypoglycemic associated symptoms. Pertinent negatives for hypoglycemia include no dizziness, headaches or tremors. There are no diabetic associated symptoms. Pertinent negatives for diabetes include no fatigue, no polydipsia, no polyphagia, no polyuria and no weakness. There are no hypoglycemic complications. Symptoms are stable. There are no diabetic complications. Risk factors for coronary artery disease include obesity, male sex and hypertension. Current diabetic treatment includes oral agent (monotherapy). He is compliant with treatment all of the time. He is following a generally healthy  diet. When asked about meal planning, he reported none. He has not had a previous visit with a dietitian. He participates in exercise every other day. There is no change in his home blood glucose trend. An ACE inhibitor/angiotensin II receptor blocker is being taken. He sees a podiatrist.Eye exam is current.  Hypertension This is a chronic problem. The current episode started more than 1 year ago. The problem is controlled. Pertinent negatives include no anxiety or headaches. There are no associated agents to hypertension. Risk factors for coronary artery disease include obesity and sedentary lifestyle. Past treatments include beta blockers. The current treatment provides no improvement. Compliance problems include exercise.  Identifiable causes of hypertension include chronic renal disease.    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 [facility]   remote hx MI;  s/p  PCI and stenting in 2003 x2 to LAD ;  s/p CABG x2 in 2004 (LIMA --LAD,  SVG--OM);  s/p  PCI  stenting x2 to LAD/ OM;  cardiac cath 05-25-2010;  stress test 07-15-2015 no ischemia, ef 45-50%, intermediate risk   Decreased range of motion of neck    s/p  radical neck dissection for cancer in 2014   Depression    Diverticulosis    GERD (gastroesophageal reflux disease)    History of colon polyps    History of concussion 01/2018   per pt had vertigo fell and hit head, no loc,  no residual   History of esophageal stricture 02/16/2011   s/p  dilatation   History of gout    toe   History of MI (myocardial infarction) 2003   s/p  PCI and stenting   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   Hypertension    followed by cardiology and pcp   Hypothyroidism, unspecified    followed by pcp---  per pt never been on medication   Limitation of opening of mouth    s/p  radical neck dissection due to cancer in 2014,  (01-28-2020  pt can get 2 finger's, but not 3)   Limited joint range of motion    limited mouth opening and neck ROM s/p radical neck dissection   Mixed hyperlipidemia    PONV (postoperative nausea and vomiting)    Psoriasis    S/P CABG x 2 2004   S/P drug eluting coronary stent placement    2003  x2 to LAD;  2006 post cabg , des x2 to LAD/ OM   S/P radiation therapy    Received 8 fractions, then stopped due to mucositis with Radiation/ Cisplatin   Type 2 diabetes mellitus treated with insulin ([place])    followed by pcp----  (01-28-2020 per pt checks blood sugar daily in am,  fasting sugar--- 100-130)   Vertigo, intermittent      Family History  Problem Relation Age of Onset   Stroke Sister    Diabetes Brother    Seizures Mother    Asthma Father    Thyroid cancer Daughter    Diabetes Sister    Parkinson's disease Brother    Heart disease Brother    Diabetes Brother    Colon cancer Neg Hx      Current Outpatient Medications:    Accu-Chek Softclix Lancets lancets, USE AS DIRECTED, Disp: 300 each, Rfl: 1   Alcohol Swabs (B-D SINGLE USE SWABS REGULAR) PADS, USE AS DIRECTED TO CHECK BLOOD SUGAR, Disp: 100 each, Rfl: 0   aspirin EC 81 MG tablet, Take 81 mg by mouth at bedtime., Disp: , Rfl:    Blood Glucose Calibration (ACCU-CHEK GUIDE CONTROL) LIQD, Use as directed E11.65, Disp: 1 each, Rfl: 2   Blood Glucose Monitoring Suppl (ACCU-CHEK GUIDE) w/Device KIT, Use to check blood sugars daily E11.65, Disp: 1 kit, Rfl: 2   Coenzyme  Q10 200 MG capsule, Take 200 mg by mouth at bedtime. , Disp: , Rfl:    enalapril (VASOTEC) 10 MG tablet, Take 1 tablet (10 mg total) by mouth daily., Disp: 90 tablet, Rfl: 1   insulin degludec (TRESIBA FLEXTOUCH) 100 UNIT/ML FlexTouch Pen, Inject 0.4 mLs (40 Units total) into the skin daily. 55 unites per day (Patient taking differently: Inject 40 Units into the skin at bedtime.), Disp: 30 mL, Rfl: 3   Insulin Pen Needle 32G X 4 MM MISC, 1 Device by Does not apply route in the morning, at noon, in the evening, and at bedtime., Disp: 400 each, Rfl: 3   meclizine (  ANTIVERT) 25 MG tablet, Take 1 tablet (25 mg total) by mouth 3 (three) times daily as needed., Disp: 90 tablet, Rfl: 1   melatonin 1 MG TABS tablet, Take 1 mg by mouth at bedtime., Disp: , Rfl:    metoprolol succinate (TOPROL-XL) 50 MG 24 hr tablet, Take 1 tablet (50 mg total) by mouth at bedtime., Disp: 90 tablet, Rfl: 1   nitroGLYCERIN (NITROSTAT) 0.4 MG SL tablet, Place 1 tablet (0.4 mg total) under the tongue every 5 (five) minutes as needed. Chest pain, Disp: 25 tablet, Rfl: 3   omeprazole (PRILOSEC) 40 MG capsule, TAKE 1 CAPSULE EVERY DAY BEFORE A MEAL, Disp: 90 capsule, Rfl: 0   simvastatin (ZOCOR) 40 MG tablet, TAKE 1 TABLET EVERY EVENING, Disp: 90 tablet, Rfl: 1   triamcinolone cream (KENALOG) 0.1 %, Apply 1 application topically 3 (three) times daily. Apply to rash bid x 7-10 days, then prn when rash return, Disp: 80 g, Rfl: 1   No Known Allergies   Review of Systems  Constitutional:  Negative for fatigue.  Respiratory:  Positive for choking (on water and pills - he has had his esophagus stretched in the past). Negative for cough and wheezing.   Cardiovascular: Negative.   Endocrine: Negative for polydipsia, polyphagia and polyuria.  Neurological:  Negative for dizziness, tremors, weakness and headaches.  Psychiatric/Behavioral: Negative.      Today's Vitals   07/29/20 1619  BP: 122/70  Pulse: (!) 56  Temp: 98 F (36.7 C)   Weight: 194 lb 6.4 oz (88.2 kg)  Height: _0  (1.676 m)  PainSc: 0-No pain   Body mass index is 31.38 kg/m.   Objective:  Physical Exam Constitutional:      General: He is not in acute distress.    Appearance: Normal appearance.  HENT:     Head: Normocephalic and atraumatic.  Cardiovascular:     Rate and Rhythm: Regular rhythm.     Pulses: Normal pulses.     Heart sounds: Normal heart sounds. No murmur heard. Pulmonary:     Effort: Pulmonary effort is normal. No respiratory distress.     Breath sounds: Normal breath sounds. No wheezing.  Skin:    Capillary Refill: Capillary refill takes less than 2 seconds.  Neurological:     Mental Status: He is alert and oriented to person, place, and time.     Cranial Nerves: No cranial nerve deficit.     Motor: No weakness.  Psychiatric:        Mood and Affect: Mood normal.        Behavior: Behavior normal.        Thought Content: Thought content normal.        Judgment: Judgment normal.        Assessment And Plan:     1. Type 2 diabetes mellitus with stage 3a chronic kidney disease, with long-term current use of insulin (HCC) Chronic, stable Continue with current medications - Hemoglobin A1c - CMP14+EGFR - Lipid panel  2. Essential hypertension Chronic, good control Continue with current medications  3. Class 1 obesity due to excess calories with body mass index (BMI) of 31.0 to 31.9 in adult, unspecified whether serious comorbidity present Chronic Discussed healthy diet and regular exercise options  Encouraged to exercise at least 150 minutes per week with 2 days of strength training  4. Sweating increase Will check metabolic causes - TSH - Testosterone, Total  5. Mixed hyperlipidemia Chronic, controlled Continue with current medications, tolerating medications well -  Lipid panel  6. Other dysphagia He has had esophageal stretching in the past, he is now having some difficulty with swallowing and feeling like  foods is getting stuck in his throat - Ambulatory referral to Gastroenterology     Patient was given opportunity to ask questions. Patient verbalized understanding of the plan and was able to repeat key elements of the plan. All questions were answered to their satisfaction.  Minette Brine, FNP   I, Minette Brine, FNP, have reviewed all documentation for this visit. The documentation on 07/29/20 for the exam, diagnosis, procedures, and orders are all accurate and complete.   IF YOU HAVE BEEN REFERRED TO A SPECIALIST, IT MAY TAKE 1-2 WEEKS TO SCHEDULE/PROCESS THE REFERRAL. IF YOU HAVE NOT HEARD FROM US/SPECIALIST IN TWO WEEKS, PLEASE GIVE [place] A CALL AT [phone].   THE PATIENT IS ENCOURAGED TO PRACTICE SOCIAL DISTANCING DUE TO THE COVID-19 PANDEMIC.

## 2020-07-29 NOTE — Patient Instructions (Signed)

## 2020-07-30 LAB — CMP14+EGFR
ALT: 15 IU/L (ref 0–44)
AST: 21 IU/L (ref 0–40)
Albumin/Globulin Ratio: 1.8 (ref 1.2–2.2)
Albumin: 4.6 g/dL (ref 3.8–4.8)
Alkaline Phosphatase: 59 IU/L (ref 44–121)
BUN/Creatinine Ratio: 11 (ref 10–24)
BUN: 17 mg/dL (ref 8–27)
Bilirubin Total: 0.4 mg/dL (ref 0.0–1.2)
CO2: 25 mmol/L (ref 20–29)
Calcium: 10 mg/dL (ref 8.6–10.2)
Chloride: 98 mmol/L (ref 96–106)
Creatinine, Ser: 1.53 mg/dL — ABNORMAL HIGH (ref 0.76–1.27)
Globulin, Total: 2.6 g/dL (ref 1.5–4.5)
Glucose: 164 mg/dL — ABNORMAL HIGH (ref 65–99)
Potassium: 4.9 mmol/L (ref 3.5–5.2)
Sodium: 138 mmol/L (ref 134–144)
Total Protein: 7.2 g/dL (ref 6.0–8.5)
eGFR: 49 mL/min/{1.73_m2} — ABNORMAL LOW (ref >59)

## 2020-07-30 LAB — HEMOGLOBIN A1C
Est. average glucose Bld gHb Est-mCnc: 206 mg/dL
Hgb A1c MFr Bld: 8.8 % — ABNORMAL HIGH (ref 4.8–5.6)

## 2020-07-30 LAB — TESTOSTERONE: Testosterone: 246 ng/dL — ABNORMAL LOW (ref 264–916)

## 2020-07-30 LAB — LIPID PANEL
Chol/HDL Ratio: 3.5 ratio (ref 0.0–5.0)
Cholesterol, Total: 144 mg/dL (ref 100–199)
HDL: 41 mg/dL (ref >39)
LDL Chol Calc (NIH): 66 mg/dL (ref 0–99)
Triglycerides: 226 mg/dL — ABNORMAL HIGH (ref 0–149)
VLDL Cholesterol Cal: 37 mg/dL (ref 5–40)

## 2020-07-30 LAB — TSH: TSH: 2.08 u[IU]/mL (ref 0.450–4.500)

## 2020-08-05 ENCOUNTER — Telehealth: Payer: Self-pay

## 2020-08-05 NOTE — Chronic Care Management (AMB) (Signed)
Chronic Care Management Pharmacy Assistant   Name: Gregory Friedman  MRN: [id number] DOB: 1950/07/28   Reason for Encounter: Disease State/ Diabetes  Recent office visits:  07-21-2020 Lynne Logan, RN (CCM)  07-29-2020 Minette Brine, [place]. STOP Colace. STOP Cherry supplement. STOP Percocet. CMP14+EGFR Glucose= 164, Creatinine= 1.53, eGFR= 49. A1C= 8.8. Lipid Trig= 226. Testosterone= 246.  Recent consult visits:  None  Hospital visits:  Medication Reconciliation was completed by comparing discharge summary, patient's EMR and Pharmacy list, and upon discussion with patient.  Admitted to the hospital on 02-24-2020 due to urinary retention. Discharge date was 02-24-2020. Discharged from [place]?Medications Started at [facility] Discharge:?? None  Medication Changes at Hospital Discharge: None  Medications Discontinued at [place]  Medications that remain the same after Hospital Discharge:??  -All other medications will remain the same.    Medications: Outpatient Encounter Medications as of 08/05/2020  Medication Sig Note   Accu-Chek Softclix Lancets lancets USE AS DIRECTED    Alcohol Swabs (B-D SINGLE USE SWABS REGULAR) PADS USE AS DIRECTED TO CHECK BLOOD SUGAR    aspirin EC 81 MG tablet Take 81 mg by mouth at bedtime. 02/05/2020: Wait to start to taking ASA 81 after surgery, will be seen on 02/23/19   Blood Glucose Calibration (ACCU-CHEK GUIDE CONTROL) LIQD Use as directed E11.65    Blood Glucose Monitoring Suppl (ACCU-CHEK GUIDE) w/Device KIT Use to check blood sugars daily E11.65    Coenzyme Q10 200 MG capsule Take 200 mg by mouth at bedtime.     enalapril (VASOTEC) 10 MG tablet Take 1 tablet (10 mg total) by mouth daily.    insulin degludec (TRESIBA FLEXTOUCH) 100 UNIT/ML FlexTouch Pen Inject 0.4 mLs (40 Units total) into the skin daily. 55 unites per day (Patient taking differently: Inject 40 Units into the skin at bedtime.)    Insulin  Pen Needle 32G X 4 MM MISC 1 Device by Does not apply route in the morning, at noon, in the evening, and at bedtime.    meclizine (ANTIVERT) 25 MG tablet Take 1 tablet (25 mg total) by mouth 3 (three) times daily as needed.    melatonin 1 MG TABS tablet Take 1 mg by mouth at bedtime.    metoprolol succinate (TOPROL-XL) 50 MG 24 hr tablet Take 1 tablet (50 mg total) by mouth at bedtime.    nitroGLYCERIN (NITROSTAT) 0.4 MG SL tablet Place 1 tablet (0.4 mg total) under the tongue every 5 (five) minutes as needed. Chest pain 01/28/2020: Per pt last taken nitro 01/ 2020   omeprazole (PRILOSEC) 40 MG capsule TAKE 1 CAPSULE EVERY DAY BEFORE A MEAL    simvastatin (ZOCOR) 40 MG tablet TAKE 1 TABLET EVERY EVENING    triamcinolone cream (KENALOG) 0.1 % Apply 1 application topically 3 (three) times daily. Apply to rash bid x 7-10 days, then prn when rash return    No facility-administered encounter medications on file as of 08/05/2020.   Recent Relevant Labs: Lab Results  Component Value Date/Time   HGBA1C 8.8 (H) 07/29/2020 05:12 PM   HGBA1C 9.0 (H) 04/01/2020 12:40 PM   MICROALBUR 10 10/31/2018 10:57 AM   MICROALBUR 1.0 04/19/2015 12:01 AM   MICROALBUR 0.2 05/14/2014 12:01 AM    Kidney Function Lab Results  Component Value Date/Time   CREATININE 1.53 (H) 07/29/2020 05:12 PM   CREATININE 1.41 (H) 04/01/2020 12:40 PM   CREATININE 1.41 (H) 04/19/2015 12:01 AM   CREATININE 1.35 05/14/2014 12:01 AM  CREATININE 1.4 (H) 05/20/2013 10:58 AM   CREATININE 1.3 11/14/2012 09:19 AM   GFRNONAA 47 (L) 01/31/2020 12:11 AM   GFRAA 50 (L) 01/01/2020 02:57 PM    Current antihyperglycemic regimen:  [name] Flextouch: Inject 0.4 ml into the skin daily  What recent interventions/DTPs have been made to improve glycemic control:  Educated on A1c and blood sugar goals Exercise goal of 150 minutes per week  Have there been any recent hospitalizations or ED visits since last visit with CPP? No  Patient denies  hypoglycemic symptoms   Patient denies hyperglycemic symptoms  How often are you checking your blood sugar? twice daily  What are your blood sugars ranging?  Fasting: 127, 98 Before meals: None After meals: None Bedtime: None  During the week, how often does your blood glucose drop below 70? Never  Are you checking your feet daily/regularly? Patient  Adherence Review: Is the patient currently on a STATIN medication? Yes Is the patient currently on ACE/ARB medication? Yes Does the patient have >5 day gap between last estimated fill dates? No   Care Gaps: Covid vaccine overdue Shingrix overdue Ophthalmology exam overdue PNA Vac overdue   Star Rating Drugs: Enalapril 10 MG- Last filled 07-02-2020 90 DS [employer] Simvastatin 40 MG- Last filled 07-06-2020 90 DS [place] Clinical Pharmacist Assistant [phone]

## 2020-08-19 ENCOUNTER — Telehealth: Payer: Self-pay | Admitting: *Deleted

## 2020-08-19 NOTE — Chronic Care Management (AMB) (Signed)
  Care Management   Note  08/19/2020 Name: Gregory Friedman MRN: [id number] DOB: Dec 08, 1950  Gregory Friedman is a 69 y.o. year old male who is a primary care patient of Minette Brine, [place] and is actively engaged with the care management team. I reached out to [name] by phone today to assist with re-scheduling a follow up visit with the RN Case Manager  Follow up plan: Unsuccessful telephone outreach attempt made. The care management team will reach out to the patient again over the next 7 days. If patient returns call to provider office, please advise to call [place]  at [phone].  [place] Management  Direct Dial: [phone]

## 2020-08-24 DIAGNOSIS — H40003 Preglaucoma, unspecified, bilateral: Secondary | ICD-10-CM | POA: Diagnosis not present

## 2020-08-26 NOTE — Chronic Care Management (AMB) (Signed)
  Care Management   Note  08/26/2020 Name: Gregory Friedman MRN: [id number] DOB: [birth year]  Gregory Friedman is a 70 y.o. year old male who is a primary care patient of Minette Brine, [place] and is actively engaged with the care management team. I reached out to [name] by phone today to assist with re-scheduling a follow up visit with the RN Case Manager  Follow up plan: Unsuccessful telephone outreach attempt made. A HIPAA compliant phone message was left for the patient providing contact information and requesting a return call.  The care management team will reach out to the patient again over the next 7-14 days.  If patient returns call to provider office, please advise to call [place]  at [phone].  [place] Management  Direct Dial: [phone]

## 2020-08-26 NOTE — Chronic Care Management (AMB) (Signed)
  Care Management   Note  08/26/2020 Name: Gregory Friedman MRN: [id number] DOB: [birth year]  Gregory Friedman is a 70 y.o. year old male who is a primary care patient of Minette Brine, [place] and is actively engaged with the care management team. I reached out to [name] by phone today to assist with re-scheduling a follow up visit with the RN Case Manager  Follow up plan: Telephone appointment with care management team member scheduled for:09/30/20  [place] Management  Direct Dial: [phone]

## 2020-08-27 ENCOUNTER — Telehealth: Payer: Medicare HMO

## 2020-09-10 ENCOUNTER — Other Ambulatory Visit: Payer: Self-pay | Admitting: Internal Medicine

## 2020-09-20 ENCOUNTER — Telehealth: Payer: Self-pay

## 2020-09-20 NOTE — Chronic Care Management (AMB) (Signed)
Chronic Care Management Pharmacy Assistant   Name: Gregory Friedman  MRN: [id number] DOB: 1950/06/13   Reason for Encounter: Disease State/ Diabetes  Recent office visits:  None  Recent consult visits:  None  Hospital visits:  None in previous 6 months  Medications: Outpatient Encounter Medications as of 09/20/2020  Medication Sig Note   Accu-Chek Softclix Lancets lancets USE AS DIRECTED    Alcohol Swabs (B-D SINGLE USE SWABS REGULAR) PADS USE AS DIRECTED TO CHECK BLOOD SUGAR    aspirin EC 81 MG tablet Take 81 mg by mouth at bedtime. 02/05/2020: Wait to start to taking ASA 81 after surgery, will be seen on 02/23/19   Blood Glucose Calibration (ACCU-CHEK GUIDE CONTROL) LIQD Use as directed E11.65    Blood Glucose Monitoring Suppl (ACCU-CHEK GUIDE) w/Device KIT Use to check blood sugars daily E11.65    Coenzyme Q10 200 MG capsule Take 200 mg by mouth at bedtime.     enalapril (VASOTEC) 10 MG tablet TAKE 1 TABLET EVERY DAY    insulin degludec (TRESIBA FLEXTOUCH) 100 UNIT/ML FlexTouch Pen Inject 0.4 mLs (40 Units total) into the skin daily. 55 unites per day (Patient taking differently: Inject 40 Units into the skin at bedtime.)    Insulin Pen Needle 32G X 4 MM MISC 1 Device by Does not apply route in the morning, at noon, in the evening, and at bedtime.    meclizine (ANTIVERT) 25 MG tablet TAKE 1 TABLET THREE TIMES DAILY AS NEEDED    melatonin 1 MG TABS tablet Take 1 mg by mouth at bedtime.    metoprolol succinate (TOPROL-XL) 50 MG 24 hr tablet Take 1 tablet (50 mg total) by mouth at bedtime.    nitroGLYCERIN (NITROSTAT) 0.4 MG SL tablet Place 1 tablet (0.4 mg total) under the tongue every 5 (five) minutes as needed. Chest pain 01/28/2020: Per pt last taken nitro 01/ 2020   omeprazole (PRILOSEC) 40 MG capsule TAKE 1 CAPSULE EVERY DAY BEFORE A MEAL    simvastatin (ZOCOR) 40 MG tablet TAKE 1 TABLET EVERY EVENING    triamcinolone cream (KENALOG) 0.1 % Apply 1 application topically 3  (three) times daily. Apply to rash bid x 7-10 days, then prn when rash return    No facility-administered encounter medications on file as of 09/20/2020.   Recent Relevant Labs: Lab Results  Component Value Date/Time   HGBA1C 8.8 (H) 07/29/2020 05:12 PM   HGBA1C 9.0 (H) 04/01/2020 12:40 PM   MICROALBUR 10 10/31/2018 10:57 AM   MICROALBUR 1.0 04/19/2015 12:01 AM   MICROALBUR 0.2 05/14/2014 12:01 AM    Kidney Function Lab Results  Component Value Date/Time   CREATININE 1.53 (H) 07/29/2020 05:12 PM   CREATININE 1.41 (H) 04/01/2020 12:40 PM   CREATININE 1.41 (H) 04/19/2015 12:01 AM   CREATININE 1.35 05/14/2014 12:01 AM   CREATININE 1.4 (H) 05/20/2013 10:58 AM   CREATININE 1.3 11/14/2012 09:19 AM   GFRNONAA 47 (L) 01/31/2020 12:11 AM   GFRAA 50 (L) 01/01/2020 02:57 PM    Current antihyperglycemic regimen:  [name] Flextouch: Inject 0.4 ml into the skin daily  What recent interventions/DTPs have been made to improve glycemic control:  Educated on A1c and blood sugar goals Exercise goal of 150 minutes per week  Have there been any recent hospitalizations or ED visits since last visit with CPP? No  Patient denies hypoglycemic symptoms  Patient denies hyperglycemic symptoms  How often are you checking your blood sugar? once daily  What are your blood  sugars ranging?  Fasting: 99, 109, 92 Before meals: None After meals: None Bedtime: None  During the week, how often does your blood glucose drop below 70? Never  Are you checking your feet daily/regularly? Patient states daily  Adherence Review: Is the patient currently on a STATIN medication? Yes Is the patient currently on ACE/ARB medication? Yes Does the patient have >5 day gap between last estimated fill dates? No   Care Gaps: Covid vaccine overdue Shingrix overdue Ophthalmology exam overdue PNA Vac overdue Tdap overdue  Star Rating Drugs: Enalapril 10 MG- Last filled 07-02-2020 90 DS [employer] Simvastatin 40 MG-  Last filled 07-06-2020 90 DS [place] Clinical Pharmacist Assistant [phone]

## 2020-09-23 ENCOUNTER — Ambulatory Visit: Payer: Medicare HMO | Admitting: Nurse Practitioner

## 2020-09-30 ENCOUNTER — Telehealth: Payer: Medicare HMO

## 2020-10-12 ENCOUNTER — Telehealth: Payer: Self-pay

## 2020-10-12 NOTE — Chronic Care Management (AMB) (Signed)
  Gregory Friedman was reminded to have all medications, supplements and any blood glucose and blood pressure readings available for review with [name], Pharm. D, at his telephone visit on 10-13-2020 at 2:00. Patient states to call [phone].   Questions: Have you had any recent office visit or specialist visit outside of [place]? Patient stated no  Are there any concerns you would like to discuss during your office visit? Patient stated he would like to discuss an alternative for diabetes medication. Patient stated maybe something pill form.  Are you having any problems obtaining your medications? (Whether it pharmacy issues or cost) Patient states no  If patient has any PAP medications ask if they are having any problems getting their PAP medication or refill? Patient states he is running low on tresiba. Called [facility] and was informed that medication was delivered 09-13-2020 at the office. Called patient back to update  Care Gaps: Covid vaccine overdue Shingrix overdue Ophthalmology exam overdue PNA Vac overdue Tdap overdue  Star Rating Drug: Enalapril 10 MG- Last filled 09-13-08-2022 90 DS [employer] Simvastatin 40 MG- Last filled 07-06-2020 90 DS [employer] (Patient states medication was just filled)  Any gaps in medications fill history? No  [place] Pharmacist Assistant [phone]

## 2020-10-13 ENCOUNTER — Telehealth: Payer: Medicare HMO

## 2020-10-13 DIAGNOSIS — H43813 Vitreous degeneration, bilateral: Secondary | ICD-10-CM | POA: Diagnosis not present

## 2020-10-13 DIAGNOSIS — H2513 Age-related nuclear cataract, bilateral: Secondary | ICD-10-CM | POA: Diagnosis not present

## 2020-10-13 DIAGNOSIS — E113293 Type 2 diabetes mellitus with mild nonproliferative diabetic retinopathy without macular edema, bilateral: Secondary | ICD-10-CM | POA: Diagnosis not present

## 2020-10-13 DIAGNOSIS — H40013 Open angle with borderline findings, low risk, bilateral: Secondary | ICD-10-CM | POA: Diagnosis not present

## 2020-10-26 ENCOUNTER — Telehealth: Payer: Self-pay

## 2020-10-26 NOTE — Chronic Care Management (AMB) (Signed)
  [name] was reminded to have all medications, supplements and any blood glucose and blood pressure readings available for review with [name], Pharm. D, at his telephone visit on 10-27-2020 at 12:00.   Questions: Have you had any recent office visit or specialist visit outside of [place]? Patient stated he saw his eye doctor on 10-13-2020.  Are there any concerns you would like to discuss during your office visit? Patient stated no  Are you having any problems obtaining your medications? (Whether it pharmacy issues or cost) Patient stated no  If patient has any PAP medications ask if they are having any problems getting their PAP medication or refill? Patient stated no  Care Gaps: PNA Vac overdue Tdap overdue Covid vaccine overdue Shingrix overdue  Star Rating Drug: Enalapril 10 MG- Last filled 09-13-08-2022 90 DS [employer] Simvastatin 40 MG- Last filled 07-06-2020 90 DS [employer] (Patient states medication was just filled)  Any gaps in medications fill history? No  [place] Pharmacist Assistant [phone]

## 2020-10-27 ENCOUNTER — Ambulatory Visit (INDEPENDENT_AMBULATORY_CARE_PROVIDER_SITE_OTHER): Payer: Medicare HMO | Admitting: Nurse Practitioner

## 2020-10-27 ENCOUNTER — Encounter: Payer: Self-pay | Admitting: Nurse Practitioner

## 2020-10-27 ENCOUNTER — Other Ambulatory Visit: Payer: Self-pay

## 2020-10-27 ENCOUNTER — Ambulatory Visit (INDEPENDENT_AMBULATORY_CARE_PROVIDER_SITE_OTHER): Payer: Medicare HMO

## 2020-10-27 VITALS — BP 120/74 | HR 54 | Temp 98.2°F | Ht 66.0 in | Wt 196.4 lb

## 2020-10-27 DIAGNOSIS — N1831 Chronic kidney disease, stage 3a: Secondary | ICD-10-CM

## 2020-10-27 DIAGNOSIS — E1122 Type 2 diabetes mellitus with diabetic chronic kidney disease: Secondary | ICD-10-CM

## 2020-10-27 DIAGNOSIS — I10 Essential (primary) hypertension: Secondary | ICD-10-CM | POA: Diagnosis not present

## 2020-10-27 DIAGNOSIS — E782 Mixed hyperlipidemia: Secondary | ICD-10-CM

## 2020-10-27 DIAGNOSIS — Z794 Long term (current) use of insulin: Secondary | ICD-10-CM | POA: Diagnosis not present

## 2020-10-27 MED ORDER — TRESIBA FLEXTOUCH 100 UNIT/ML ~~LOC~~ SOPN: 40.0000 [IU] | PEN_INJECTOR | Freq: Every day | SUBCUTANEOUS | Status: AC

## 2020-10-27 MED ORDER — DAPAGLIFLOZIN PROPANEDIOL 10 MG PO TABS: 10.0000 mg | ORAL_TABLET | Freq: Every day | ORAL | 2 refills | Status: DC

## 2020-10-27 NOTE — Progress Notes (Signed)
Chronic Care Management Pharmacy Note  11/02/2020 Name:  Gregory Friedman MRN:  [id number] DOB:  May 13, 1950  Summary: Patient reports that he would like to start an oral medication for diabetes  Recommendations/Changes made from today's visit: Recommend patient have patient assistance paperwork completed for 01/03/2021.   Plan: Will let patient know that they will be re-enrolled in patient assistance through [facility] next year due to changes in PAP program.    Subjective: Gregory Friedman is an 70 y.o. year old male who is a primary patient of Minette Brine, [place].  The CCM team was consulted for assistance with disease management and care coordination needs.    Engaged with patient by telephone for follow up visit in response to provider referral for pharmacy case management and/or care coordination services.   Consent to Services:  The patient was given information about Chronic Care Management services, agreed to services, and gave verbal consent prior to initiation of services.  Please see initial visit note for detailed documentation.   Patient Care Team: Minette Brine, FNP as PCP - General (General Practice) Fay Records, MD as PCP - Cardiology (Cardiology) Rozetta Nunnery, MD as Attending Physician (Otolaryngology) Eppie Gibson, MD as Attending Physician (Radiation Oncology) Fay Records, MD as Attending Physician (Cardiology) Inda Castle, MD (Inactive) as Attending Physician (Gastroenterology) Lynne Logan, RN as Case Manager Edythe Clarity, [facility] (Pharmacist) Mayford Knife, [facility] (Pharmacist)  Recent office visits: 7/72022 PCP ov   Recent consult visits: 08/24/2020 [facility] visits: None in previous 6 months   Objective:  Lab Results  Component Value Date   CREATININE 1.50 (H) 10/27/2020   BUN 14 10/27/2020   GFRNONAA 47 (L) 01/31/2020   GFRAA 50 (L) 01/01/2020   NA 141 10/27/2020   K 4.9 10/27/2020   CALCIUM  9.6 10/27/2020   CO2 26 10/27/2020   GLUCOSE 130 (H) 10/27/2020    Lab Results  Component Value Date/Time   HGBA1C 8.5 (H) 10/27/2020 04:43 PM   HGBA1C 8.8 (H) 07/29/2020 05:12 PM   MICROALBUR 10 10/31/2018 10:57 AM   MICROALBUR 1.0 04/19/2015 12:01 AM   MICROALBUR 0.2 05/14/2014 12:01 AM    Last diabetic Eye exam:  Lab Results  Component Value Date/Time   HMDIABEYEEXA No Retinopathy 09/14/2015 12:00 AM    Last diabetic Foot exam: No results found for: HMDIABFOOTEX   Lab Results  Component Value Date   CHOL 139 10/27/2020   HDL 46 10/27/2020   LDLCALC 68 10/27/2020   TRIG 142 10/27/2020   CHOLHDL 3.0 10/27/2020    Hepatic Function Latest Ref Rng & Units 07/29/2020 04/01/2020 01/01/2020  Total Protein 6.0 - 8.5 g/dL 7.2 8.0 7.7  Albumin 3.8 - 4.8 g/dL 4.6 4.8 4.8  AST 0 - 40 IU/L 21 13 20   ALT 0 - 44 IU/L 15 12 14   Alk Phosphatase 44 - 121 IU/L 59 88 75  Total Bilirubin 0.0 - 1.2 mg/dL 0.4 0.4 0.4  Bilirubin, Direct 0.0 - 0.3 mg/dL - - -    Lab Results  Component Value Date/Time   TSH 2.080 07/29/2020 05:12 PM   TSH 2.500 01/01/2020 02:57 PM   FREET4 1.13 10/31/2018 12:15 PM   FREET4 1.46 04/25/2018 12:18 PM    CBC Latest Ref Rng & Units 04/01/2020 01/31/2020 01/30/2020  WBC 3.4 - 10.8 x10E3/uL 5.7 6.3 -  Hemoglobin 13.0 - 17.7 g/dL 13.2 11.9(L) 14.6  Hematocrit 37.5 - 51.0 % 41.3 36.5(L) 43.0  Platelets 150 - 450 x10E3/uL 200 130(L) -    Lab Results  Component Value Date/Time   VD25OH 76.0 04/17/2019 03:17 PM    Clinical ASCVD: Yes  The 10-year ASCVD risk score (Arnett DK, et al., 2019) is: 31.3%   Values used to calculate the score:     Age: 89 years     Sex: Male     Is Non-Hispanic African American: No     Diabetic: Yes     Tobacco smoker: No     Systolic Blood Pressure: 570 mmHg     Is BP treated: Yes     HDL Cholesterol: 46 mg/dL     Total Cholesterol: 139 mg/dL    Depression screen [facility] 2/9 07/29/2020 04/01/2020 04/01/2020  Decreased Interest 0 0 0   Down, Depressed, Hopeless 0 0 0  PHQ - 2 Score 0 0 0  Altered sleeping 0 - 0  Tired, decreased energy 0 - 0  Change in appetite 0 - 0  Feeling bad or failure about yourself  0 - 0  Trouble concentrating 0 - 0  Moving slowly or fidgety/restless 0 - 0  Suicidal thoughts 0 - 0  PHQ-9 Score 0 - 0  Difficult doing work/chores Not difficult at all - -  Some recent data might be hidden     Social History   Tobacco Use  Smoking Status Never  Smokeless Tobacco Never   BP Readings from Last 3 Encounters:  10/27/20 120/74  07/29/20 122/70  04/01/20 128/70   Pulse Readings from Last 3 Encounters:  10/27/20 (!) 54  07/29/20 (!) 56  04/01/20 (!) 51   Wt Readings from Last 3 Encounters:  10/27/20 196 lb 6.4 oz (89.1 kg)  07/29/20 194 lb 6.4 oz (88.2 kg)  04/01/20 192 lb (87.1 kg)   BMI Readings from Last 3 Encounters:  10/27/20 31.70 kg/m  07/29/20 31.38 kg/m  04/01/20 30.25 kg/m    Assessment/Interventions: Review of patient past medical history, allergies, medications, health status, including review of consultants reports, laboratory and other test data, was performed as part of comprehensive evaluation and provision of chronic care management services.   SDOH:  (Social Determinants of Health) assessments and interventions performed: Yes  SDOH Screenings   Alcohol Screen: Not on file  Depression (PHQ2-9): Low Risk    PHQ-2 Score: 0  Financial Resource Strain: Low Risk    Difficulty of Paying Living Expenses: Not hard at all  Food Insecurity: No Food Insecurity   Worried About Charity fundraiser in the Last Year: Never true   Ran Out of Food in the Last Year: Never true  Housing: Not on file  Physical Activity: Insufficiently Active   Days of Exercise per Week: 7 days   Minutes of Exercise per Session: 20 min  Social Connections: Not on file  Stress: No Stress Concern Present   Feeling of Stress : Not at all  Tobacco Use: Low Risk    Smoking Tobacco Use: Never    Smokeless Tobacco Use: Never  Transportation Needs: No Transportation Needs   Lack of Transportation (Medical): No   Lack of Transportation (Non-Medical): No    CCM Care Plan  No Known Allergies  Medications Reviewed Today     Reviewed by Lynne Logan, RN (Registered Nurse) on 10/29/20 at [place] List Status: <None>   Medication Order Taking? Sig Documenting Provider Last Dose Status Informant  Accu-Chek Softclix Lancets lancets [id number] No USE AS DIRECTED [name], MD  Taking Active   Alcohol Swabs (B-D SINGLE USE SWABS REGULAR) PADS [id number] No USE AS DIRECTED TO CHECK BLOOD SUGAR Minette Brine, FNP Taking Active   aspirin EC 81 MG tablet [id number] No Take 81 mg by mouth at bedtime. [provider] Taking Active            Med Note Pricilla Holm Feb 05, 2020  2:20 PM) Wait to start to taking ASA 81 after surgery, will be seen on 02/23/19  Blood Glucose Calibration (ACCU-CHEK GUIDE CONTROL) LIQD [id number] No Use as directed E11.65 Minette Brine, FNP Taking Active   Blood Glucose Monitoring Suppl (ACCU-CHEK GUIDE) w/Device KIT [id number] No Use to check blood sugars daily E11.65 Minette Brine, FNP Taking Active   Coenzyme Q10 200 MG capsule [id number] No Take 200 mg by mouth at bedtime.  [provider] Taking Active Self  dapagliflozin propanediol (FARXIGA) 10 MG TABS tablet [id number]  Take 1 tablet (10 mg total) by mouth daily before breakfast. Minette Brine, FNP  Active   enalapril (VASOTEC) 10 MG tablet [id number] No TAKE 1 TABLET EVERY DAY Minette Brine, FNP Taking Active   insulin degludec (TRESIBA FLEXTOUCH) 100 UNIT/ML FlexTouch Pen [id number]  Inject 40 Units into the skin at bedtime. Minette Brine, FNP  Active   Insulin Pen Needle 32G X 4 MM MISC [id number] No 1 Device by Does not apply route in the morning, at noon, in the evening, and at bedtime. Shamleffer, Melanie Crazier, MD Taking Active   meclizine (ANTIVERT) 25 MG tablet [id number] No  TAKE 1 TABLET THREE TIMES DAILY AS NEEDED Minette Brine, FNP Taking Active   melatonin 1 MG TABS tablet [id number] No Take 1 mg by mouth at bedtime. [provider] Taking Active   metoprolol succinate (TOPROL-XL) 50 MG 24 hr tablet [id number] No Take 1 tablet (50 mg total) by mouth at bedtime. Fay Records, MD Taking Active   nitroGLYCERIN (NITROSTAT) 0.4 MG SL tablet [id number] No Place 1 tablet (0.4 mg total) under the tongue every 5 (five) minutes as needed. Chest pain Fay Records, MD Taking Active Self           Med Note Dyke Brackett Jan 28, 2020 12:08 PM) Per pt last taken nitro 01/ 2020  omeprazole (PRILOSEC) 40 MG capsule [id number] No TAKE 1 CAPSULE EVERY DAY BEFORE A MEAL [name], MD Taking Active   simvastatin (ZOCOR) 40 MG tablet [id number] No TAKE 1 TABLET EVERY Wenda Low, FNP Taking Active   triamcinolone cream (KENALOG) 0.1 % [id number] No Apply 1 application topically 3 (three) times daily. Apply to rash bid x 7-10 days, then prn when rash return Rodriguez-[place], Sunday Spillers, [place] Taking Active Self            Patient Active Problem List   Diagnosis Date Noted   BPH (benign prostatic hyperplasia) 01/30/2020   Low back pain 06/11/2019   Type 2 diabetes mellitus with hyperglycemia, with long-term current use of insulin ([place]) 05/23/2019   Type 2 diabetes mellitus with stage 3a chronic kidney disease, with long-term current use of insulin ([place]) 05/23/2019   Dyslipidemia 05/23/2019   Psoriasis 12/12/2018   Skin lesion 10/31/2018   Diabetes mellitus type 2, insulin dependent ([place]) 09/12/2018   Elevated serum creatinine 09/12/2018   Hypothyroidism 10/10/2017   Atherosclerosis of aorta ([place]) 10/10/2017   Unspecified skin changes 10/10/2017   Other long term (current) drug therapy 10/10/2017   Abdominal pain 09/21/2017  Nausea & vomiting 09/21/2017   History of head and neck cancer 09/21/2017   History of colonic polyps 04/04/2016    Dysphagia 04/04/2016   Bloating 04/04/2016   Diarrhea 04/04/2016   Trigger finger of right thumb 08/23/2015   Chest pain 07/15/2015   Pain in the chest    S/P CABG (coronary artery bypass graft)    Noncompliance with medications 07/08/2015   Acute gout 04/19/2015   GERD (gastroesophageal reflux disease) 05/14/2014   Depression, major, in remission ([place]) 03/27/2013   Allergic rhinitis, cause unspecified 03/27/2013   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   Depressive disorder, not elsewhere classified 12/06/2011   Nonspecific (abnormal) findings on radiological and other examination of gastrointestinal tract 02/10/2011   Dysphagia, unspecified(787.20) 02/10/2011   Palpitations 02/02/2011   Dizziness 07/15/2010   DIAB W/OPHTH MANIFESTS TYPE I [JUV TYPE] UNCNTRL 08/02/2009   Mixed hyperlipidemia 08/02/2009   Essential hypertension 08/02/2009   CAD (coronary artery disease) 08/02/2009    Immunization History  Administered Date(s) Administered   Influenza Split 11/24/2010, 12/06/2011   Influenza, High Dose Seasonal PF 11/17/2014, 10/23/2017, 12/05/2018   Influenza,inj,Quad PF,6+ Mos 11/25/2012, 01/01/2014   Pneumococcal Conjugate-13 01/07/2015   Pneumococcal Polysaccharide-23 10/21/2009   Tdap 05/27/2009    Conditions to be addressed/monitored:  Hypertension and Diabetes  Care Plan : [place]  Updates made by Mayford Knife, [place] since 11/02/2020 12:00 AM     Problem: HLD, DM II   Priority: High     Long-Range Goal: Disease Management   Recent Progress: On track  Priority: High  Note:    Current Barriers:  Unable to independently afford treatment regimen  Pharmacist Clinical Goal(s):  Patient will achieve adherence to monitoring guidelines and medication adherence to achieve therapeutic efficacy through collaboration with PharmD and provider.   Interventions: 1:1 collaboration with Minette Brine, FNP regarding development and update  of comprehensive plan of care as evidenced by provider attestation and co-signature Inter-disciplinary care team collaboration (see longitudinal plan of care) Comprehensive medication review performed; medication list updated in electronic medical record    Hyperlipidemia: (LDL goal < 70) -Controlled -Current treatment: Simvastatin 40 mg tablet once per day -Current dietary patterns: patient is not eating fried and fatty foods  -Current exercise habits: patient reports that he is still doing weights and working in his yard. He does exercise for 20 minutes 7 days a week  -Educated on Cholesterol goals;  Importance of limiting foods high in cholesterol; Exercise goal of 150 minutes per week; -Recommended to continue current medication  Diabetes (A1c goal <8%) -Not ideally controlled -Current medications: Tresiba 100 unit/ml : inject 0.4 mls into the skin daily, 55 units per day Farxiga 10 mg tablet once per day daily  -Medications previously tried:Glimepiride -Current home glucose readings fasting glucose: 110 - 108-107, 99  -Denies hypoglycemic/hyperglycemic symptoms -Current meal patterns:  lunch: 2 eggs and oatmeal with pecans and bananas   dinner: light, salad, veggies with celery  snacks: one oatmeal cookie  drinks: coffee, drinking water -Current exercise habits: patient reports that he is still doing weights and working in his yard. He does exercise for 20 minutes 7 days a week  -Educated on A1c and blood sugar goals; -Counseled to check feet daily and get yearly eye exams -Counseled on diet and exercise extensively Recommended to continue current medication  Patient Goals/Self-Care Activities Patient will:  - take medications as prescribed  Follow Up Plan: The patient has been provided with contact information  for the care management team and has been advised to call with any health related questions or concerns.        Medication Assistance: None required.   Patient affirms current coverage meets needs.  Compliance/Adherence/Medication fill history: Care Gaps: Opthamology Exam - 10/13/2020 patient complete   Star-Rating Drugs: Simvastatin 40 mg tablet  Enalapril 10 mg tablet Farxiga 10 mg tablet   Patient's preferred pharmacy is:  [facility] [place], [place] - [address] AT [facility] [address] [place] [address] Phone: [phone] Fax: [phone]  [place], [place] [address] Phone: [phone] Fax: [phone]  Uses pill box? Yes Pt endorses 90% compliance  We discussed: Current pharmacy is preferred with insurance plan and patient is satisfied with pharmacy services Patient decided to: Continue current medication management strategy  Care Plan and Follow Up Patient Decision:  Patient agrees to Care Plan and Follow-up.  Plan: The patient has been provided with contact information for the care management team and has been advised to call with any health related questions or concerns.   [name], PharmD Clinical Pharmacist [facility] [phone]

## 2020-10-27 NOTE — Progress Notes (Signed)
I,Katawbba Wiggins,acting as a Education administrator for [employer], FNP.,have documented all relevant documentation on the behalf of Minette Brine, FNP,as directed by  Minette Brine, FNP while in the presence of Minette Brine, [place].  This visit occurred during the SARS-CoV-2 public health emergency.  Safety protocols were in place, including screening questions prior to the visit, additional usage of staff PPE, and extensive cleaning of exam room while observing appropriate contact time as indicated for disinfecting solutions.  Subjective:     Patient ID: Gregory Friedman , male    DOB: 1950-06-02 , 70 y.o.   MRN: [id number]   Chief Complaint  Patient presents with   Diabetes    HPI  Pt presents today for a follow up on diabetes follow-up. States "I feel good". Does not need an y refills.   Wt Readings from Last 3 Encounters: 10/27/20 : 196 lb 6.4 oz (89.1 kg) 07/29/20 : 194 lb 6.4 oz (88.2 kg) 04/01/20 : 192 lb (87.1 kg)  He denies having any more sweating.   Diabetes He presents for his follow-up diabetic visit. He has type 2 diabetes mellitus. There are no hypoglycemic associated symptoms. Pertinent negatives for hypoglycemia include no dizziness, headaches or tremors. There are no diabetic associated symptoms. Pertinent negatives for diabetes include no chest pain, no fatigue, no polydipsia, no polyphagia, no polyuria and no weakness. There are no hypoglycemic complications. Symptoms are stable. There are no diabetic complications. Risk factors for coronary artery disease include obesity, male sex and hypertension. Current diabetic treatment includes oral agent (monotherapy). He is compliant with treatment all of the time. He is following a generally healthy diet. When asked about meal planning, he reported none. He has not had a previous visit with a dietitian. He participates in exercise every other day. There is no change in his home blood glucose trend. (Blood sugar 88-122 since his last visit.)  An ACE inhibitor/angiotensin II receptor blocker is being taken. He sees a podiatrist.Eye exam is current.  Hypertension This is a chronic problem. The current episode started more than 1 year ago. The problem is controlled. Pertinent negatives include no anxiety, chest pain, headaches or palpitations. There are no associated agents to hypertension. Risk factors for coronary artery disease include obesity and sedentary lifestyle. Past treatments include beta blockers. The current treatment provides no improvement. Compliance problems include exercise.  Identifiable causes of hypertension include chronic renal disease.    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 [place] [facility]   remote hx MI;  s/p  PCI and stenting in 2003 x2 to LAD ;  s/p CABG x2 in 2004 (LIMA --LAD,  SVG--OM);  s/p  PCI stenting x2 to LAD/ OM;  cardiac cath 05-25-2010;  stress test 07-15-2015 no ischemia, ef 45-50%, intermediate risk   Decreased range of motion of neck    s/p  radical neck dissection for cancer in 2014  Depression    Diverticulosis    GERD (gastroesophageal reflux disease)    History of colon polyps    History of concussion 01/2018   per pt had vertigo fell and hit head, no loc, no residual   History of esophageal stricture 02/16/2011   s/p  dilatation   History of gout    toe   History of MI (myocardial infarction) 2003   s/p  PCI and stenting    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   Hypertension    followed by cardiology and pcp   Hypothyroidism, unspecified    followed by pcp---  per pt never been on medication   Limitation of opening of mouth    s/p  radical neck dissection due to cancer in 2014,  (01-28-2020  pt can get 2 finger's, but not 3)   Limited joint range of motion    limited mouth opening and neck ROM s/p radical neck dissection   Mixed hyperlipidemia    PONV (postoperative nausea and vomiting)    Psoriasis    S/P CABG x 2 2004   S/P drug eluting coronary stent placement    2003  x2 to LAD;  2006 post cabg , des x2 to LAD/ OM   S/P radiation therapy    Received 8 fractions, then stopped due to mucositis with Radiation/ Cisplatin   Type 2 diabetes mellitus treated with insulin ([place])    followed by pcp----  (01-28-2020 per pt checks blood sugar daily in am,  fasting sugar--- 100-130)   Vertigo, intermittent      Family History  Problem Relation Age of Onset   Stroke Sister    Diabetes Brother    Seizures Mother    Asthma Father    Thyroid cancer Daughter    Diabetes Sister    Parkinson's disease Brother    Heart disease Brother    Diabetes Brother    Colon cancer Neg Hx      Current Outpatient Medications:    Accu-Chek Softclix Lancets lancets, USE AS DIRECTED, Disp: 300 each, Rfl: 1   Alcohol Swabs (B-D SINGLE USE SWABS REGULAR) PADS, USE AS DIRECTED TO CHECK BLOOD SUGAR, Disp: 100 each, Rfl: 0   aspirin EC 81 MG tablet, Take 81 mg by mouth at bedtime., Disp: , Rfl:    Blood Glucose Calibration (ACCU-CHEK GUIDE CONTROL) LIQD, Use as directed E11.65, Disp: 1 each, Rfl: 2   Blood Glucose Monitoring Suppl (ACCU-CHEK GUIDE) w/Device KIT, Use to check blood sugars daily E11.65, Disp: 1 kit, Rfl: 2   Coenzyme Q10 200 MG capsule, Take 200 mg by mouth at bedtime. , Disp: , Rfl:    dapagliflozin propanediol (FARXIGA) 10 MG TABS tablet, Take 1 tablet (10 mg total) by mouth daily  before breakfast., Disp: 30 tablet, Rfl: 2   enalapril (VASOTEC) 10 MG tablet, TAKE 1 TABLET EVERY DAY, Disp: 90 tablet, Rfl: 1   Insulin Pen Needle 32G X 4 MM MISC, 1 Device by Does not apply route in the morning, at noon, in the evening, and at bedtime., Disp: 400 each, Rfl: 3   meclizine (ANTIVERT) 25 MG tablet, TAKE 1 TABLET THREE TIMES DAILY AS NEEDED, Disp: 90 tablet, Rfl: 1   melatonin 1 MG TABS tablet, Take 1 mg by mouth at bedtime., Disp: , Rfl:    metoprolol succinate (TOPROL-XL) 50 MG 24 hr tablet, Take 1 tablet (50 mg total) by mouth at bedtime., Disp: 90 tablet, Rfl: 1  nitroGLYCERIN (NITROSTAT) 0.4 MG SL tablet, Place 1 tablet (0.4 mg total) under the tongue every 5 (five) minutes as needed. Chest pain, Disp: 25 tablet, Rfl: 3   omeprazole (PRILOSEC) 40 MG capsule, TAKE 1 CAPSULE EVERY DAY BEFORE A MEAL, Disp: 90 capsule, Rfl: 0   simvastatin (ZOCOR) 40 MG tablet, TAKE 1 TABLET EVERY EVENING, Disp: 90 tablet, Rfl: 1   triamcinolone cream (KENALOG) 0.1 %, Apply 1 application topically 3 (three) times daily. Apply to rash bid x 7-10 days, then prn when rash return, Disp: 80 g, Rfl: 1   insulin degludec (TRESIBA FLEXTOUCH) 100 UNIT/ML FlexTouch Pen, Inject 40 Units into the skin at bedtime., Disp: , Rfl:    No Known Allergies   Review of Systems  Constitutional: Negative.  Negative for fatigue.  Respiratory: Negative.    Cardiovascular: Negative.  Negative for chest pain, palpitations and leg swelling.  Endocrine: Negative for polydipsia, polyphagia and polyuria.  Neurological:  Negative for dizziness, tremors, weakness and headaches.  Psychiatric/Behavioral: Negative.      Today's Vitals   10/27/20 1536  BP: 120/74  Pulse: (!) 54  Temp: 98.2 F (36.8 C)  Weight: 196 lb 6.4 oz (89.1 kg)  Height: 5' 6" (1.676 m)   Body mass index is 31.7 kg/m.  Wt Readings from Last 3 Encounters:  10/27/20 196 lb 6.4 oz (89.1 kg)  07/29/20 194 lb 6.4 oz (88.2 kg)  04/01/20 192 lb (87.1  kg)    BP Readings from Last 3 Encounters:  10/27/20 120/74  07/29/20 122/70  04/01/20 128/70    Objective:  Physical Exam Vitals reviewed.  Constitutional:      General: He is not in acute distress.    Appearance: Normal appearance. He is obese.  HENT:     Head: Normocephalic and atraumatic.  Cardiovascular:     Rate and Rhythm: Regular rhythm.     Pulses: Normal pulses.     Heart sounds: Normal heart sounds. No murmur heard. Pulmonary:     Effort: Pulmonary effort is normal. No respiratory distress.     Breath sounds: Normal breath sounds. No wheezing.  Skin:    Capillary Refill: Capillary refill takes less than 2 seconds.  Neurological:     Mental Status: He is alert and oriented to person, place, and time.     Cranial Nerves: No cranial nerve deficit.     Motor: No weakness.  Psychiatric:        Mood and Affect: Mood normal.        Behavior: Behavior normal.        Thought Content: Thought content normal.        Judgment: Judgment normal.        Assessment And Plan:     1. Type 2 diabetes mellitus with stage 3a chronic kidney disease, with long-term current use of insulin (HCC) Comments: Blood sugars are improving, goal to decrease his [name] he is now on 40 units daily.   Will start [place] pending labs, given samples of 10 mg  - Hemoglobin A1c - BMP8+EGFR - insulin degludec (TRESIBA FLEXTOUCH) 100 UNIT/ML FlexTouch Pen; Inject 40 Units into the skin at bedtime. - dapagliflozin propanediol (FARXIGA) 10 MG TABS tablet; Take 1 tablet (10 mg total) by mouth daily before breakfast.  Dispense: 30 tablet; Refill: 2  2. Essential hypertension Comments: Blood pressure is well controlled  3. Mixed hyperlipidemia Comments: Stable, continue current medications - Lipid panel     Patient was given opportunity to ask  questions. Patient verbalized understanding of the plan and was able to repeat key elements of the plan. All questions were answered to their  satisfaction.  Minette Brine, FNP   I, Minette Brine, FNP, have reviewed all documentation for this visit. The documentation on 10/27/20 for the exam, diagnosis, procedures, and orders are all accurate and complete.   IF YOU HAVE BEEN REFERRED TO A SPECIALIST, IT MAY TAKE 1-2 WEEKS TO SCHEDULE/PROCESS THE REFERRAL. IF YOU HAVE NOT HEARD FROM US/SPECIALIST IN TWO WEEKS, PLEASE GIVE [place] A CALL AT [phone].   THE PATIENT IS ENCOURAGED TO PRACTICE SOCIAL DISTANCING DUE TO THE COVID-19 PANDEMIC.

## 2020-10-27 NOTE — Patient Instructions (Signed)

## 2020-10-28 LAB — BMP8+EGFR
BUN/Creatinine Ratio: 9 — ABNORMAL LOW (ref 10–24)
BUN: 14 mg/dL (ref 8–27)
CO2: 26 mmol/L (ref 20–29)
Calcium: 9.6 mg/dL (ref 8.6–10.2)
Chloride: 101 mmol/L (ref 96–106)
Creatinine, Ser: 1.5 mg/dL — ABNORMAL HIGH (ref 0.76–1.27)
Glucose: 130 mg/dL — ABNORMAL HIGH (ref 70–99)
Potassium: 4.9 mmol/L (ref 3.5–5.2)
Sodium: 141 mmol/L (ref 134–144)
eGFR: 49 mL/min/{1.73_m2} — ABNORMAL LOW (ref >59)

## 2020-10-28 LAB — LIPID PANEL
Chol/HDL Ratio: 3 ratio (ref 0.0–5.0)
Cholesterol, Total: 139 mg/dL (ref 100–199)
HDL: 46 mg/dL (ref >39)
LDL Chol Calc (NIH): 68 mg/dL (ref 0–99)
Triglycerides: 142 mg/dL (ref 0–149)
VLDL Cholesterol Cal: 25 mg/dL (ref 5–40)

## 2020-10-28 LAB — HEMOGLOBIN A1C
Est. average glucose Bld gHb Est-mCnc: 197 mg/dL
Hgb A1c MFr Bld: 8.5 % — ABNORMAL HIGH (ref 4.8–5.6)

## 2020-10-29 ENCOUNTER — Telehealth: Payer: Medicare HMO

## 2020-10-29 ENCOUNTER — Ambulatory Visit: Payer: Self-pay

## 2020-10-29 DIAGNOSIS — F325 Major depressive disorder, single episode, in full remission: Secondary | ICD-10-CM

## 2020-10-29 DIAGNOSIS — N400 Enlarged prostate without lower urinary tract symptoms: Secondary | ICD-10-CM

## 2020-10-29 DIAGNOSIS — F5101 Primary insomnia: Secondary | ICD-10-CM

## 2020-10-29 DIAGNOSIS — N1831 Chronic kidney disease, stage 3a: Secondary | ICD-10-CM

## 2020-10-29 DIAGNOSIS — Z794 Long term (current) use of insulin: Secondary | ICD-10-CM

## 2020-10-29 DIAGNOSIS — E785 Hyperlipidemia, unspecified: Secondary | ICD-10-CM

## 2020-11-02 NOTE — Patient Instructions (Addendum)
Visit Information It was great speaking with you today!  Please let me know if you have any questions about our visit.   Goals Addressed             This Visit's Progress    Manage My Medicine       Timeframe:  Long-Range Goal Priority:  High Start Date:                             Expected End Date:                       Follow Up Date: 02/02/2021   In progress:   - call for medicine refill 2 or 3 days before it runs out - call if I am sick and can't take my medicine - use an alarm clock or phone to remind me to take my medicine    Why is this important?   These steps will help you keep on track with your medicines.         Patient Care Plan: CCM Pharmacy Care Plan     Problem Identified: HLD, DM II   Priority: High     Long-Range Goal: Disease Management   Recent Progress: On track  Priority: High  Note:    Current Barriers:  Unable to independently afford treatment regimen  Pharmacist Clinical Goal(s):  Patient will achieve adherence to monitoring guidelines and medication adherence to achieve therapeutic efficacy through collaboration with PharmD and provider.   Interventions: 1:1 collaboration with Minette Brine, FNP regarding development and update of comprehensive plan of care as evidenced by provider attestation and co-signature Inter-disciplinary care team collaboration (see longitudinal plan of care) Comprehensive medication review performed; medication list updated in electronic medical record    Hyperlipidemia: (LDL goal < 70) -Controlled -Current treatment: Simvastatin 40 mg tablet once per day -Current dietary patterns: patient is not eating fried and fatty foods  -Current exercise habits: patient reports that he is still doing weights and working in his yard. He does exercise for 20 minutes 7 days a week  -Educated on Cholesterol goals;  Importance of limiting foods high in cholesterol; Exercise goal of 150 minutes per week; -Recommended  to continue current medication  Diabetes (A1c goal <8%) -Not ideally controlled -Current medications: Tresiba 100 unit/ml : inject 0.4 mls into the skin daily, 55 units per day Farxiga 10 mg tablet once per day daily  -Medications previously tried:Glimepiride -Current home glucose readings fasting glucose: 110 - 108-107, 99  -Denies hypoglycemic/hyperglycemic symptoms -Current meal patterns:  lunch: 2 eggs and oatmeal with pecans and bananas   dinner: light, salad, veggies with celery  snacks: one oatmeal cookie  drinks: coffee, drinking water -Current exercise habits: patient reports that he is still doing weights and working in his yard. He does exercise for 20 minutes 7 days a week  -Educated on A1c and blood sugar goals; -Counseled to check feet daily and get yearly eye exams -Counseled on diet and exercise extensively Recommended to continue current medication  Patient Goals/Self-Care Activities Patient will:  - take medications as prescribed  Follow Up Plan: The patient has been provided with contact information for the care management team and has been advised to call with any health related questions or concerns.        Patient agreed to services and verbal consent obtained.   The patient verbalized understanding of instructions, educational materials, and care  plan provided today and agreed to receive a mailed copy of patient instructions, educational materials, and care plan.   [name], PharmD Clinical Pharmacist [facility] [phone]

## 2020-11-11 ENCOUNTER — Telehealth: Payer: Self-pay

## 2020-11-11 NOTE — Chronic Care Management (AMB) (Signed)
Chronic Care Management Pharmacy Assistant   Name: Gregory Friedman  MRN: [id number] DOB: [birth year]  Reason for Encounter: Patient Assistance Coordination/ Coordination of Enhanced Pharmacy Services.  11/11/2020- Patient came by the office 11/09/2020 after speaking with [name], CPP needing assistance for Farxiga. Application printed for patient to sign, awaiting provider signature to fax. Patient received a 7 day supply sample of Farxiga 10 mg and 30 day free trial coupon to use if no determination made by the time patient is out of sample.   11/17/2020- Called patient to review medications for [facility], no answer, voicemail box not set up.  Patient called back, medication reviewed patient agreed to [employer]. First delivery scheduled for 12/08/2020. Patient will receive the following medications: Enalapril 10 mg- 1 tablet daily (evening meal) Simvastatin 40 mg- 1 tablet daily (evening meal) Metoprolol 50 mg- 1 tablet daily (evening meal)- Patient just receved a 90 day supply from [facility] on 11/09/2020, will deliver 01/2021. Meclizine 25 mg- 1 tablet three times daily prn.    Patient receives [place] through patient assistance program. Patient will let us know when he needs refills of Accu-check guide test strips and lancets.   Notes: Patient expressed that his blood sugars are looking much better after starting Farxiga, his fasting blood sugars are around 84-90 he has only had 1 reading of around 122. Patient would also like to know if he could get a prescription for Flonase to help with his allergies. He used to use Flonase but discontinued due to possible effects it had on his Prostate. Patient has had Prostate Surgery and follows up with the Urologist on 12/06/2020. Message sent to PCP and CPP to advise.   Medications: Outpatient Encounter Medications as of 11/11/2020  Medication Sig Note   Accu-Chek Softclix Lancets lancets USE  AS DIRECTED    Alcohol Swabs (B-D SINGLE USE SWABS REGULAR) PADS USE AS DIRECTED TO CHECK BLOOD SUGAR    aspirin EC 81 MG tablet Take 81 mg by mouth at bedtime. 02/05/2020: Wait to start to taking ASA 81 after surgery, will be seen on 02/23/19   Blood Glucose Calibration (ACCU-CHEK GUIDE CONTROL) LIQD Use as directed E11.65    Blood Glucose Monitoring Suppl (ACCU-CHEK GUIDE) w/Device KIT Use to check blood sugars daily E11.65    Coenzyme Q10 200 MG capsule Take 200 mg by mouth at bedtime.     dapagliflozin propanediol (FARXIGA) 10 MG TABS tablet Take 1 tablet (10 mg total) by mouth daily before breakfast.    enalapril (VASOTEC) 10 MG tablet TAKE 1 TABLET EVERY DAY    insulin degludec (TRESIBA FLEXTOUCH) 100 UNIT/ML FlexTouch Pen Inject 40 Units into the skin at bedtime.    Insulin Pen Needle 32G X 4 MM MISC 1 Device by Does not apply route in the morning, at noon, in the evening, and at bedtime.    meclizine (ANTIVERT) 25 MG tablet TAKE 1 TABLET THREE TIMES DAILY AS NEEDED    melatonin 1 MG TABS tablet Take 1 mg by mouth at bedtime.    metoprolol succinate (TOPROL-XL) 50 MG 24 hr tablet Take 1 tablet (50 mg total) by mouth at bedtime.    nitroGLYCERIN (NITROSTAT) 0.4 MG SL tablet Place 1 tablet (0.4 mg total) under the tongue every 5 (five) minutes as needed. Chest pain 01/28/2020: Per pt last taken nitro 01/ 2020   omeprazole (PRILOSEC) 40 MG capsule TAKE 1 CAPSULE EVERY DAY BEFORE A MEAL    simvastatin (ZOCOR)  40 MG tablet TAKE 1 TABLET EVERY EVENING    triamcinolone cream (KENALOG) 0.1 % Apply 1 application topically 3 (three) times daily. Apply to rash bid x 7-10 days, then prn when rash return    No facility-administered encounter medications on file as of 11/11/2020.    Pattricia Boss, [place] Pharmacist Assistant [phone]

## 2020-11-12 NOTE — Chronic Care Management (AMB) (Signed)
Chronic Care Management   CCM RN Visit Note  10/29/2020 Name: Gregory Friedman MRN: [id number] DOB: Jun 09, 1950  Subjective: Gregory Friedman is a 70 y.o. year old male who is a primary care patient of Minette Brine, [place]. The care management team was consulted for assistance with disease management and care coordination needs.    Engaged with patient by telephone for follow up visit in response to provider referral for case management and/or care coordination services.   Consent to Services:  The patient was given information about Chronic Care Management services, agreed to services, and gave verbal consent prior to initiation of services.  Please see initial visit note for detailed documentation.   Patient agreed to services and verbal consent obtained.   Assessment: Review of patient past medical history, allergies, medications, health status, including review of consultants reports, laboratory and other test data, was performed as part of comprehensive evaluation and provision of chronic care management services.   SDOH (Social Determinants of Health) assessments and interventions performed:    CCM Care Plan  No Known Allergies  Outpatient Encounter Medications as of 10/29/2020  Medication Sig Note   Accu-Chek Softclix Lancets lancets USE AS DIRECTED    Alcohol Swabs (B-D SINGLE USE SWABS REGULAR) PADS USE AS DIRECTED TO CHECK BLOOD SUGAR    aspirin EC 81 MG tablet Take 81 mg by mouth at bedtime. 02/05/2020: Wait to start to taking ASA 81 after surgery, will be seen on 02/23/19   Blood Glucose Calibration (ACCU-CHEK GUIDE CONTROL) LIQD Use as directed E11.65    Blood Glucose Monitoring Suppl (ACCU-CHEK GUIDE) w/Device KIT Use to check blood sugars daily E11.65    Coenzyme Q10 200 MG capsule Take 200 mg by mouth at bedtime.     dapagliflozin propanediol (FARXIGA) 10 MG TABS tablet Take 1 tablet (10 mg total) by mouth daily before breakfast.    enalapril (VASOTEC) 10 MG tablet TAKE  1 TABLET EVERY DAY    insulin degludec (TRESIBA FLEXTOUCH) 100 UNIT/ML FlexTouch Pen Inject 40 Units into the skin at bedtime.    Insulin Pen Needle 32G X 4 MM MISC 1 Device by Does not apply route in the morning, at noon, in the evening, and at bedtime.    meclizine (ANTIVERT) 25 MG tablet TAKE 1 TABLET THREE TIMES DAILY AS NEEDED    melatonin 1 MG TABS tablet Take 1 mg by mouth at bedtime.    metoprolol succinate (TOPROL-XL) 50 MG 24 hr tablet Take 1 tablet (50 mg total) by mouth at bedtime.    nitroGLYCERIN (NITROSTAT) 0.4 MG SL tablet Place 1 tablet (0.4 mg total) under the tongue every 5 (five) minutes as needed. Chest pain 01/28/2020: Per pt last taken nitro 01/ 2020   omeprazole (PRILOSEC) 40 MG capsule TAKE 1 CAPSULE EVERY DAY BEFORE A MEAL    simvastatin (ZOCOR) 40 MG tablet TAKE 1 TABLET EVERY EVENING    triamcinolone cream (KENALOG) 0.1 % Apply 1 application topically 3 (three) times daily. Apply to rash bid x 7-10 days, then prn when rash return    No facility-administered encounter medications on file as of 10/29/2020.    Patient Active Problem List   Diagnosis Date Noted   BPH (benign prostatic hyperplasia) 01/30/2020   Low back pain 06/11/2019   Type 2 diabetes mellitus with hyperglycemia, with long-term current use of insulin ([place]) 05/23/2019   Type 2 diabetes mellitus with stage 3a chronic kidney disease, with long-term current use of insulin ([place]) 05/23/2019   Dyslipidemia 05/23/2019  Psoriasis 12/12/2018   Skin lesion 10/31/2018   Diabetes mellitus type 2, insulin dependent ([place]) 09/12/2018   Elevated serum creatinine 09/12/2018   Hypothyroidism 10/10/2017   Atherosclerosis of aorta ([place]) 10/10/2017   Unspecified skin changes 10/10/2017   Other long term (current) drug therapy 10/10/2017   Abdominal pain 09/21/2017   Nausea & vomiting 09/21/2017   History of head and neck cancer 09/21/2017   History of colonic polyps 04/04/2016   Dysphagia 04/04/2016   Bloating  04/04/2016   Diarrhea 04/04/2016   Trigger finger of right thumb 08/23/2015   Chest pain 07/15/2015   Pain in the chest    S/P CABG (coronary artery bypass graft)    Noncompliance with medications 07/08/2015   Acute gout 04/19/2015   GERD (gastroesophageal reflux disease) 05/14/2014   Depression, major, in remission ([place]) 03/27/2013   Allergic rhinitis, cause unspecified 03/27/2013   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   Depressive disorder, not elsewhere classified 12/06/2011   Nonspecific (abnormal) findings on radiological and other examination of gastrointestinal tract 02/10/2011   Dysphagia, unspecified(787.20) 02/10/2011   Palpitations 02/02/2011   Dizziness 07/15/2010   DIAB W/OPHTH MANIFESTS TYPE I [JUV TYPE] UNCNTRL 08/02/2009   Mixed hyperlipidemia 08/02/2009   Essential hypertension 08/02/2009   CAD (coronary artery disease) 08/02/2009    Conditions to be addressed/monitored: Type 2 Diabetes Mellitus with 3a chronic kidney disease, Insomnia, Depression, Benign prostatic hyperplasia, Hyperlipidemia  Care Plan : RN Care Manager Plan of Care  Updates made by Lynne Logan, RN since 10/29/2020 12:00 AM     Problem: No Plan for Chronic disease education and Care Coordination needs for Type 2 Diabetes Mellitus with 3a chronic kidney disease, Insomnia, Depression, Benign prostatic hyperplasia, Hyperlipidemia   Priority: High     Long-Range Goal: Establish Plan of Care for Chronic disease education and Care Coordination needs for Type 2 Diabetes Mellitus with 3a chronic kidney disease, Insomnia, Depression, Benign prostatic hyperplasia, Hyperlipidemia   Start Date: 10/29/2020  Expected End Date: 10/29/2021  This Visit's Progress: On track  Priority: High  Note:   Current Barriers:  Knowledge Deficits related to plan of care for management of Type 2 Diabetes Mellitus with 3a chronic kidney disease, Insomnia, Depression, Benign prostatic hyperplasia,  Hyperlipidemia Chronic Disease Management support and education needs related to Type 2 Diabetes Mellitus with 3a chronic kidney disease, Insomnia, Depression, Benign prostatic hyperplasia, Hyperlipidemia  RNCM Clinical Goal(s):  Patient will demonstrate Ongoing health management independence   continue to work with RN Care Manager to address care management and care coordination needs related to  Type 2 Diabetes Mellitus with 3a chronic kidney disease, Insomnia, Depression, Benign prostatic hyperplasia, Hyperlipidemia will demonstrate ongoing self health care management ability    through collaboration with RN Care manager, provider, and care team.   Interventions: 1:1 collaboration with primary care provider regarding development and update of comprehensive plan of care as evidenced by provider attestation and co-signature Inter-disciplinary care team collaboration (see longitudinal plan of care) Evaluation of current treatment plan related to  self management and patient's adherence to plan as established by provider   Diabetes Interventions: Assessed patient's understanding of A1c goal: <7% Provided education to patient about basic DM disease process; Reviewed medications with patient and discussed importance of medication adherence; Counseled on importance of regular laboratory monitoring as prescribed; Discussed plans with patient for ongoing care management follow up and provided patient with direct contact information for care management team; Provided patient with written educational materials related  to hypo and hyperglycemia and importance of correct treatment; Advised patient, providing education and rationale, to check cbg daily before breakfast and at bedtime and record, calling PCP and or RN Care manager  for findings outside established parameters; Review of patient status, including review of consultants reports, relevant laboratory and other test results, and medications  completed; Lab Results  Component Value Date   HGBA1C 8.5 (H) 10/27/2020   Chronic Kidney Disease Interventions:  (Status:  Goal on track:  Yes Assessed the Patient understanding of chronic kidney disease    Evaluation of current treatment plan related to chronic kidney disease self management and patient's adherence to plan as established by provider      Reviewed prescribed diet increase water to 64 oz daily  Reviewed medications with patient and discussed importance of compliance    Counseled on the importance of exercise goals with target of 150 minutes per week     Advised patient, providing education and rationale, to monitor blood pressure daily and record, calling PCP for findings outside established parameters    Discussed complications of poorly controlled blood pressure such as heart disease, stroke, circulatory complications, vision complications, kidney impairment, sexual dysfunction    Discussed plans with patient for ongoing care management follow up and provided patient with direct contact information for care management team    Provided education on kidney disease progression    Last practice recorded BP readings:  BP Readings from Last 3 Encounters:  10/27/20 120/74  07/29/20 122/70  04/01/20 128/70  Most recent eGFR/CrCl:  Lab Results  Component Value Date   EGFR 49 (L) 10/27/2020    No components found for: CRCL  Patient Goals/Self-Care Activities: Patient will self administer medications as prescribed Patient will attend all scheduled provider appointments Patient will call pharmacy for medication refills Patient will attend church or other social activities Patient will continue to perform ADL's independently Patient will continue to perform IADL's independently Patient will call provider office for new concerns or questions  Follow Up Plan:  Telephone follow up appointment with care management team member scheduled for:  01/06/21      Plan:Telephone  follow up appointment with care management team member scheduled for:  01/06/21  Barb Merino, RN, BSN, CCM Care Management Coordinator [facility]  Direct Phone: [phone]

## 2020-11-12 NOTE — Patient Instructions (Signed)
Visit Information   Consent to CCM Services: Gregory Friedman was given information about Chronic Care Management services including:  CCM service includes personalized support from designated clinical staff supervised by his physician, including individualized plan of care and coordination with other care providers 24/7 contact phone numbers for assistance for urgent and routine care needs. Service will only be billed when office clinical staff spend 20 minutes or more in a month to coordinate care. Only one practitioner may furnish and bill the service in a calendar month. The patient may stop CCM services at any time (effective at the end of the month) by phone call to the office staff. The patient will be responsible for cost sharing (co-pay) of up to 20% of the service fee (after annual deductible is met).  Patient agreed to services and verbal consent obtained.   The patient verbalized understanding of instructions, educational materials, and care plan provided today and declined offer to receive copy of patient instructions, educational materials, and care plan.   Telephone follow up appointment with care management team member scheduled for: 01/06/21  Barb Merino, RN, BSN, CCM Care Management Coordinator [facility]  Direct Phone: [phone]    CLINICAL CARE PLAN:  Patient Care Plan: RN Care Manager Plan of Care     Problem Identified: No Plan for Chronic disease education and Care Coordination needs for Type 2 Diabetes Mellitus with 3a chronic kidney disease, Insomnia, Depression, Benign prostatic hyperplasia, Hyperlipidemia   Priority: High     Long-Range Goal: Establish Plan of Care for Chronic disease education and Care Coordination needs for Type 2 Diabetes Mellitus with 3a chronic kidney disease, Insomnia, Depression, Benign prostatic hyperplasia, Hyperlipidemia   Start Date: 10/29/2020  Expected End Date: 10/29/2021  This  Visit's Progress: On track  Priority: High  Note:   Current Barriers:  Knowledge Deficits related to plan of care for management of Type 2 Diabetes Mellitus with 3a chronic kidney disease, Insomnia, Depression, Benign prostatic hyperplasia, Hyperlipidemia Chronic Disease Management support and education needs related to Type 2 Diabetes Mellitus with 3a chronic kidney disease, Insomnia, Depression, Benign prostatic hyperplasia, Hyperlipidemia  RNCM Clinical Goal(s):  Patient will demonstrate Ongoing health management independence   continue to work with RN Care Manager to address care management and care coordination needs related to  Type 2 Diabetes Mellitus with 3a chronic kidney disease, Insomnia, Depression, Benign prostatic hyperplasia, Hyperlipidemia will demonstrate ongoing self health care management ability    through collaboration with RN Care manager, provider, and care team.   Interventions: 1:1 collaboration with primary care provider regarding development and update of comprehensive plan of care as evidenced by provider attestation and co-signature Inter-disciplinary care team collaboration (see longitudinal plan of care) Evaluation of current treatment plan related to  self management and patient's adherence to plan as established by provider   Diabetes Interventions: Assessed patient's understanding of A1c goal: <7% Provided education to patient about basic DM disease process; Reviewed medications with patient and discussed importance of medication adherence; Counseled on importance of regular laboratory monitoring as prescribed; Discussed plans with patient for ongoing care management follow up and provided patient with direct contact information for care management team; Provided patient with written educational materials related to hypo and hyperglycemia and importance of correct treatment; Advised patient, providing education and rationale, to check cbg daily before  breakfast and at bedtime and record, calling PCP and or RN Care manager  for findings outside established parameters; Review of patient status, including  review of consultants reports, relevant laboratory and other test results, and medications completed; Lab Results  Component Value Date   HGBA1C 8.5 (H) 10/27/2020   Chronic Kidney Disease Interventions:  (Status:  Goal on track:  Yes Assessed the Patient understanding of chronic kidney disease    Evaluation of current treatment plan related to chronic kidney disease self management and patient's adherence to plan as established by provider      Reviewed prescribed diet increase water to 64 oz daily  Reviewed medications with patient and discussed importance of compliance    Counseled on the importance of exercise goals with target of 150 minutes per week     Advised patient, providing education and rationale, to monitor blood pressure daily and record, calling PCP for findings outside established parameters    Discussed complications of poorly controlled blood pressure such as heart disease, stroke, circulatory complications, vision complications, kidney impairment, sexual dysfunction    Discussed plans with patient for ongoing care management follow up and provided patient with direct contact information for care management team    Provided education on kidney disease progression    Last practice recorded BP readings:  BP Readings from Last 3 Encounters:  10/27/20 120/74  07/29/20 122/70  04/01/20 128/70  Most recent eGFR/CrCl:  Lab Results  Component Value Date   EGFR 49 (L) 10/27/2020    No components found for: CRCL  Patient Goals/Self-Care Activities: Patient will self administer medications as prescribed Patient will attend all scheduled provider appointments Patient will call pharmacy for medication refills Patient will attend church or other social activities Patient will continue to perform ADL's independently Patient will  continue to perform IADL's independently Patient will call provider office for new concerns or questions  Follow Up Plan:  Telephone follow up appointment with care management team member scheduled for:  01/06/21

## 2020-11-18 ENCOUNTER — Other Ambulatory Visit: Payer: Self-pay

## 2020-11-18 ENCOUNTER — Other Ambulatory Visit: Payer: Self-pay | Admitting: Nurse Practitioner

## 2020-11-18 DIAGNOSIS — E1122 Type 2 diabetes mellitus with diabetic chronic kidney disease: Secondary | ICD-10-CM

## 2020-11-18 DIAGNOSIS — N1831 Chronic kidney disease, stage 3a: Secondary | ICD-10-CM

## 2020-11-18 MED ORDER — FLUTICASONE PROPIONATE 50 MCG/ACT NA SUSP: 2.0000 | Freq: Every day | NASAL | 2 refills | Status: AC

## 2020-11-18 MED ORDER — DAPAGLIFLOZIN PROPANEDIOL 10 MG PO TABS: 10.0000 mg | ORAL_TABLET | Freq: Every day | ORAL | 2 refills | Status: DC

## 2020-11-22 ENCOUNTER — Other Ambulatory Visit: Payer: Self-pay

## 2020-11-22 DIAGNOSIS — Z794 Long term (current) use of insulin: Secondary | ICD-10-CM

## 2020-11-22 DIAGNOSIS — N400 Enlarged prostate without lower urinary tract symptoms: Secondary | ICD-10-CM | POA: Diagnosis not present

## 2020-11-22 DIAGNOSIS — E785 Hyperlipidemia, unspecified: Secondary | ICD-10-CM | POA: Diagnosis not present

## 2020-11-22 DIAGNOSIS — N1831 Chronic kidney disease, stage 3a: Secondary | ICD-10-CM

## 2020-11-22 DIAGNOSIS — E1122 Type 2 diabetes mellitus with diabetic chronic kidney disease: Secondary | ICD-10-CM | POA: Diagnosis not present

## 2020-11-22 DIAGNOSIS — E782 Mixed hyperlipidemia: Secondary | ICD-10-CM

## 2020-11-22 DIAGNOSIS — F325 Major depressive disorder, single episode, in full remission: Secondary | ICD-10-CM

## 2020-11-25 ENCOUNTER — Telehealth: Payer: Self-pay | Admitting: Internal Medicine

## 2020-11-25 ENCOUNTER — Other Ambulatory Visit: Payer: Self-pay

## 2020-11-25 ENCOUNTER — Telehealth: Payer: Self-pay

## 2020-11-25 DIAGNOSIS — I10 Essential (primary) hypertension: Secondary | ICD-10-CM

## 2020-11-25 DIAGNOSIS — E785 Hyperlipidemia, unspecified: Secondary | ICD-10-CM

## 2020-11-25 MED ORDER — INSULIN PEN NEEDLE 32G X 4 MM MISC: 1.0000 | Freq: Four times a day (QID) | 3 refills | Status: AC

## 2020-11-25 MED ORDER — ENALAPRIL MALEATE 10 MG PO TABS: 10.0000 mg | ORAL_TABLET | Freq: Every day | ORAL | 1 refills | Status: DC

## 2020-11-25 MED ORDER — METOPROLOL SUCCINATE ER 50 MG PO TB24: 50.0000 mg | ORAL_TABLET | Freq: Every day | ORAL | 0 refills | Status: DC

## 2020-11-25 MED ORDER — SIMVASTATIN 40 MG PO TABS: 40.0000 mg | ORAL_TABLET | Freq: Every evening | ORAL | 1 refills | Status: AC

## 2020-11-25 NOTE — Telephone Encounter (Signed)
Refill sent to pharmacy.   

## 2020-11-25 NOTE — Telephone Encounter (Signed)
*  STAT* If patient is at the pharmacy, call can be transferred to refill team.   1. Which medications need to be refilled? (please list name of each medication and dose if known)  metoprolol succinate (TOPROL-XL) 50 MG 24 hr tablet  2. Which pharmacy/location (including street and city if local pharmacy) is medication to be sent to? [facility] - [place], [place] - [place] [address]  3. Do they need a 30 day or 90 day supply? 30 with refills  Patient is transitioning to [facility]

## 2020-11-25 NOTE — Chronic Care Management (AMB) (Signed)
    Chronic Care Management Pharmacy Assistant   Name: Isabel Freese  MRN: [id number] DOB: 1950/01/30   Reason for Encounter: Medication refill request    Medications: Outpatient Encounter Medications as of 11/25/2020  Medication Sig Note   Accu-Chek Softclix Lancets lancets USE AS DIRECTED    Alcohol Swabs (B-D SINGLE USE SWABS REGULAR) PADS USE AS DIRECTED TO CHECK BLOOD SUGAR    aspirin EC 81 MG tablet Take 81 mg by mouth at bedtime. 02/05/2020: Wait to start to taking ASA 81 after surgery, will be seen on 02/23/19   Blood Glucose Calibration (ACCU-CHEK GUIDE CONTROL) LIQD Use as directed E11.65    Blood Glucose Monitoring Suppl (ACCU-CHEK GUIDE) w/Device KIT Use to check blood sugars daily E11.65    Coenzyme Q10 200 MG capsule Take 200 mg by mouth at bedtime.     dapagliflozin propanediol (FARXIGA) 10 MG TABS tablet Take 1 tablet (10 mg total) by mouth daily before breakfast.    enalapril (VASOTEC) 10 MG tablet TAKE 1 TABLET EVERY DAY    fluticasone (FLONASE) 50 MCG/ACT nasal spray Place 2 sprays into both nostrils daily.    insulin degludec (TRESIBA FLEXTOUCH) 100 UNIT/ML FlexTouch Pen Inject 40 Units into the skin at bedtime.    Insulin Pen Needle 32G X 4 MM MISC 1 Device by Does not apply route in the morning, at noon, in the evening, and at bedtime.    meclizine (ANTIVERT) 25 MG tablet TAKE 1 TABLET THREE TIMES DAILY AS NEEDED    melatonin 1 MG TABS tablet Take 1 mg by mouth at bedtime.    metoprolol succinate (TOPROL-XL) 50 MG 24 hr tablet Take 1 tablet (50 mg total) by mouth at bedtime.    nitroGLYCERIN (NITROSTAT) 0.4 MG SL tablet Place 1 tablet (0.4 mg total) under the tongue every 5 (five) minutes as needed. Chest pain 01/28/2020: Per pt last taken nitro 01/ 2020   omeprazole (PRILOSEC) 40 MG capsule TAKE 1 CAPSULE EVERY DAY BEFORE A MEAL    simvastatin (ZOCOR) 40 MG tablet TAKE 1 TABLET EVERY EVENING    triamcinolone cream (KENALOG) 0.1 % Apply 1 application topically 3  (three) times daily. Apply to rash bid x 7-10 days, then prn when rash return    No facility-administered encounter medications on file as of 11/25/2020.   11-25-2020: Received a message from the pharmacy requesting refills on Enalapril, Meclizine, Simvastatin, and Metoprolol. Sent request to [facility] for Enalapril, Meclizine and Simvastatin and called [facility] for Metoprolol.   [place] Pharmacist Assistant [phone]

## 2020-12-01 ENCOUNTER — Telehealth: Payer: Self-pay

## 2020-12-01 NOTE — Chronic Care Management (AMB) (Signed)
    Chronic Care Management Pharmacy Assistant   Name: Gregory Friedman  MRN: [id number] DOB: 1950/11/13   Reason for Encounter: 2023 PAP    Medications: Outpatient Encounter Medications as of 12/01/2020  Medication Sig Note   Accu-Chek Softclix Lancets lancets USE AS DIRECTED    Alcohol Swabs (B-D SINGLE USE SWABS REGULAR) PADS USE AS DIRECTED TO CHECK BLOOD SUGAR    aspirin EC 81 MG tablet Take 81 mg by mouth at bedtime. 02/05/2020: Wait to start to taking ASA 81 after surgery, will be seen on 02/23/19   Blood Glucose Calibration (ACCU-CHEK GUIDE CONTROL) LIQD Use as directed E11.65    Blood Glucose Monitoring Suppl (ACCU-CHEK GUIDE) w/Device KIT Use to check blood sugars daily E11.65    Coenzyme Q10 200 MG capsule Take 200 mg by mouth at bedtime.     dapagliflozin propanediol (FARXIGA) 10 MG TABS tablet Take 1 tablet (10 mg total) by mouth daily before breakfast.    enalapril (VASOTEC) 10 MG tablet Take 1 tablet (10 mg total) by mouth daily.    fluticasone (FLONASE) 50 MCG/ACT nasal spray Place 2 sprays into both nostrils daily.    insulin degludec (TRESIBA FLEXTOUCH) 100 UNIT/ML FlexTouch Pen Inject 40 Units into the skin at bedtime.    Insulin Pen Needle 32G X 4 MM MISC 1 Device by Does not apply route in the morning, at noon, in the evening, and at bedtime.    meclizine (ANTIVERT) 25 MG tablet TAKE 1 TABLET THREE TIMES DAILY AS NEEDED    melatonin 1 MG TABS tablet Take 1 mg by mouth at bedtime.    metoprolol succinate (TOPROL-XL) 50 MG 24 hr tablet Take 1 tablet (50 mg total) by mouth at bedtime.    nitroGLYCERIN (NITROSTAT) 0.4 MG SL tablet Place 1 tablet (0.4 mg total) under the tongue every 5 (five) minutes as needed. Chest pain 01/28/2020: Per pt last taken nitro 01/ 2020   omeprazole (PRILOSEC) 40 MG capsule TAKE 1 CAPSULE EVERY DAY BEFORE A MEAL    simvastatin (ZOCOR) 40 MG tablet Take 1 tablet (40 mg total) by mouth every evening.    triamcinolone cream (KENALOG) 0.1 % Apply 1  application topically 3 (three) times daily. Apply to rash bid x 7-10 days, then prn when rash return    No facility-administered encounter medications on file as of 12/01/2020.   12-01-2020: Initiated [id number] application for tresiba. Informed patient that application will be mailed to sign and return to the office with income.  [place] Pharmacist Assistant [phone]

## 2020-12-08 ENCOUNTER — Other Ambulatory Visit: Payer: Self-pay | Admitting: Internal Medicine

## 2020-12-08 DIAGNOSIS — I10 Essential (primary) hypertension: Secondary | ICD-10-CM

## 2020-12-11 ENCOUNTER — Other Ambulatory Visit: Payer: Self-pay | Admitting: Nurse Practitioner

## 2020-12-11 DIAGNOSIS — Z794 Long term (current) use of insulin: Secondary | ICD-10-CM

## 2020-12-11 DIAGNOSIS — E1165 Type 2 diabetes mellitus with hyperglycemia: Secondary | ICD-10-CM

## 2020-12-13 ENCOUNTER — Other Ambulatory Visit: Payer: Self-pay

## 2020-12-13 MED ORDER — MECLIZINE HCL 25 MG PO TABS: 25.0000 mg | ORAL_TABLET | Freq: Three times a day (TID) | ORAL | 1 refills | Status: AC | PRN

## 2020-12-27 ENCOUNTER — Telehealth: Payer: Self-pay

## 2020-12-27 NOTE — Progress Notes (Signed)
Chronic Care Management Pharmacy Assistant   Name: Gregory Friedman  MRN: [id number] DOB: 1950-05-05   Reason for Encounter: Disease State/ Hypertension, Diabetes Adherence Call   Conditions to be addressed/monitored:Hypertension/ Diabetes  Recent office visits:   12/01/2020 Melecca Hicks CMA (CCM) Initiated 0141 application for [place]. Informed patient the application will be mailed for her to sign and return to the office with income verification.  12/01/2020 Melecca Hicks CMA (CCM) medication refills submitted for Enalapril, Meclizine, Simvastatin, and Metoprolol.  11/11/2020 Pattricia Boss CMA (CCM) Initiated  application for [place]. Informed patient the application will be mailed for her to sign and return to the office with income verification.  10/29/2020 Angel Little RN (CCM)  10/27/2020 Minette Brine FNP (PCP) started Dapagliflozin Propanediol 31m Daily   Hospital visits:  None in previous 6 months  Medications: Outpatient Encounter Medications as of 12/27/2020  Medication Sig Note   Accu-Chek Softclix Lancets lancets USE AS DIRECTED    Alcohol Swabs (DROPSAFE ALCOHOL PREP) 70 % PADS USE AS DIRECTED TO CHECK BLOOD SUGAR    aspirin EC 81 MG tablet Take 81 mg by mouth at bedtime. 02/05/2020: Wait to start to taking ASA 81 after surgery, will be seen on 02/23/19   Blood Glucose Calibration (ACCU-CHEK GUIDE CONTROL) LIQD Use as directed E11.65    Blood Glucose Monitoring Suppl (ACCU-CHEK GUIDE) w/Device KIT Use to check blood sugars daily E11.65    Coenzyme Q10 200 MG capsule Take 200 mg by mouth at bedtime.     dapagliflozin propanediol (FARXIGA) 10 MG TABS tablet Take 1 tablet (10 mg total) by mouth daily before breakfast.    enalapril (VASOTEC) 10 MG tablet Take 1 tablet (10 mg total) by mouth daily.    fluticasone (FLONASE) 50 MCG/ACT nasal spray Place 2 sprays into both nostrils daily.    insulin degludec (TRESIBA FLEXTOUCH) 100 UNIT/ML FlexTouch Pen Inject 40 Units  into the skin at bedtime.    Insulin Pen Needle 32G X 4 MM MISC 1 Device by Does not apply route in the morning, at noon, in the evening, and at bedtime.    meclizine (ANTIVERT) 25 MG tablet Take 1 tablet (25 mg total) by mouth 3 (three) times daily as needed.    melatonin 1 MG TABS tablet Take 1 mg by mouth at bedtime.    metoprolol succinate (TOPROL-XL) 50 MG 24 hr tablet Take 1 tablet (50 mg total) by mouth at bedtime. Please keep upcoming appt in February 2023 with Cardiologist before anymore refills. Thank you    nitroGLYCERIN (NITROSTAT) 0.4 MG SL tablet Place 1 tablet (0.4 mg total) under the tongue every 5 (five) minutes as needed. Chest pain 01/28/2020: Per pt last taken nitro 01/ 2020   omeprazole (PRILOSEC) 40 MG capsule TAKE 1 CAPSULE EVERY DAY BEFORE A MEAL    simvastatin (ZOCOR) 40 MG tablet Take 1 tablet (40 mg total) by mouth every evening.    triamcinolone cream (KENALOG) 0.1 % Apply 1 application topically 3 (three) times daily. Apply to rash bid x 7-10 days, then prn when rash return    No facility-administered encounter medications on file as of 12/27/2020.    Recent Office Vitals: BP Readings from Last 3 Encounters:  10/27/20 120/74  07/29/20 122/70  04/01/20 128/70   Pulse Readings from Last 3 Encounters:  10/27/20 (!) 54  07/29/20 (!) 56  04/01/20 (!) 51    Wt Readings from Last 3 Encounters:  10/27/20 196 lb 6.4 oz (89.1 kg)  07/29/20  194 lb 6.4 oz (88.2 kg)  04/01/20 192 lb (87.1 kg)     Kidney Function Lab Results  Component Value Date/Time   CREATININE 1.50 (H) 10/27/2020 04:43 PM   CREATININE 1.53 (H) 07/29/2020 05:12 PM   CREATININE 1.41 (H) 04/19/2015 12:01 AM   CREATININE 1.35 05/14/2014 12:01 AM   CREATININE 1.4 (H) 05/20/2013 10:58 AM   CREATININE 1.3 11/14/2012 09:19 AM   GFRNONAA 47 (L) 01/31/2020 12:11 AM   GFRAA 50 (L) 01/01/2020 02:57 PM    BMP Latest Ref Rng & Units 10/27/2020 07/29/2020 04/01/2020  Glucose 70 - 99 mg/dL 130(H) 164(H) 226(H)   BUN 8 - 27 mg/dL 14 17 12   Creatinine 0.76 - 1.27 mg/dL 1.50(H) 1.53(H) 1.41(H)  BUN/Creat Ratio 10 - 24 9(L) 11 9(L)  Sodium 134 - 144 mmol/L 141 138 140  Potassium 3.5 - 5.2 mmol/L 4.9 4.9 4.2  Chloride 96 - 106 mmol/L 101 98 99  CO2 20 - 29 mmol/L 26 25 24   Calcium 8.6 - 10.2 mg/dL 9.6 10.0 9.7     Current antihypertensive regimen:  Enalapril 10 mg 1 tablet daily, Metoprolol 88m daily  Patient verbally confirms he is taking the above medications as directed. Yes  How often are you checking your Blood Pressure? twice daily  he checks his blood pressure in the morning before taking his medication.  Current home BP readings:   DATE:             BP               PULSE 12/27/20                 116/64                     53  12/26/20                117/60                     56  Wrist or arm cuff: Arm cuff Caffeine intake: 2 cups Salt intake: None OTC medications including pseudoephedrine or NSAIDs? No  Any readings above 180/120? Yes If yes any symptoms of hypertensive emergency?    What recent interventions/DTPs have been made by any provider to improve Blood Pressure control since last CPP Visit: Patient to decrease salt intake and increase exercise.  Any recent hospitalizations or ED visits since last visit with CPP? No  What diet changes have been made to improve Blood Pressure Control?  Patient decreased salt intake and monitor sodium levels in food.  What exercise is being done to improve your Blood Pressure Control?  Patient uses exercise bike daily for 20 minutes.  Adherence Review: Is the patient currently on ACE/ARB medication? Yes Does the patient have >5 day gap between last estimated fill dates? No  Recent Relevant Labs: Lab Results  Component Value Date/Time   HGBA1C 8.5 (H) 10/27/2020 04:43 PM   HGBA1C 8.8 (H) 07/29/2020 05:12 PM   MICROALBUR 10 10/31/2018 10:57 AM   MICROALBUR 1.0 04/19/2015 12:01 AM   MICROALBUR 0.2 05/14/2014 12:01 AM     Kidney Function Lab Results  Component Value Date/Time   CREATININE 1.50 (H) 10/27/2020 04:43 PM   CREATININE 1.53 (H) 07/29/2020 05:12 PM   CREATININE 1.41 (H) 04/19/2015 12:01 AM   CREATININE 1.35 05/14/2014 12:01 AM   CREATININE 1.4 (H) 05/20/2013 10:58 AM   CREATININE 1.3 11/14/2012 09:19 AM   GFRNONAA  47 (L) 01/31/2020 12:11 AM   GFRAA 50 (L) 01/01/2020 02:57 PM     Current antihyperglycemic regimen:  Tresiba 100 unit/ml : inject 0.4 mls into the skin daily, 55 units per day Farxiga 10 mg tablet once per day daily    Patient verbally confirms he is taking the above medications as directed. Yes  What recent interventions/DTPs have been made to improve glycemic control:  Patient to decrease sugar intake.  Have there been any recent hospitalizations or ED visits since last visit with CPP? No  Patient denies hypoglycemic symptoms, including none  Patient denies hyperglycemic symptoms, including none  How often are you checking your blood sugar? once daily  What are your blood sugars ranging?  Fasting: 114     On insulin? Yes How many units: 40 units  During the week, how often does your blood glucose drop below 70? Never  Are you checking your feet daily/regularly? Yes  Adherence Review: Is the patient currently on a STATIN medication? Yes Is the patient currently on ACE/ARB medication? Yes Does the patient have >5 day gap between last estimated fill dates? No  Care Gaps:  Last eye exam - 10/13/20 Last Diabetic Foot Exam? 06/10/2019 Overdue Colonoscopy- 05/30/2017 Last annual wellness visit? 04/01/2020   Star Rating Drugs: Medication:  Last Fill: Day Supply Simvastatin 53m         11/09/20     90DS      [facility] Enalapril 10 mg tablet     12/14/20       90 DS     [facility] Farxiga 10 mg tablet      patient   receives medications from [facility]   [place] Assistant ([phone]

## 2020-12-31 DIAGNOSIS — Z79899 Other long term (current) drug therapy: Secondary | ICD-10-CM | POA: Diagnosis not present

## 2020-12-31 DIAGNOSIS — G4700 Insomnia, unspecified: Secondary | ICD-10-CM | POA: Diagnosis not present

## 2020-12-31 DIAGNOSIS — Z7189 Other specified counseling: Secondary | ICD-10-CM | POA: Diagnosis not present

## 2020-12-31 DIAGNOSIS — Z0001 Encounter for general adult medical examination with abnormal findings: Secondary | ICD-10-CM | POA: Diagnosis not present

## 2020-12-31 DIAGNOSIS — E1122 Type 2 diabetes mellitus with diabetic chronic kidney disease: Secondary | ICD-10-CM | POA: Diagnosis not present

## 2020-12-31 DIAGNOSIS — E785 Hyperlipidemia, unspecified: Secondary | ICD-10-CM | POA: Diagnosis not present

## 2020-12-31 DIAGNOSIS — Z125 Encounter for screening for malignant neoplasm of prostate: Secondary | ICD-10-CM | POA: Diagnosis not present

## 2020-12-31 DIAGNOSIS — E559 Vitamin D deficiency, unspecified: Secondary | ICD-10-CM | POA: Diagnosis not present

## 2020-12-31 DIAGNOSIS — E663 Overweight: Secondary | ICD-10-CM | POA: Diagnosis not present

## 2020-12-31 DIAGNOSIS — E1169 Type 2 diabetes mellitus with other specified complication: Secondary | ICD-10-CM | POA: Diagnosis not present

## 2020-12-31 DIAGNOSIS — Z23 Encounter for immunization: Secondary | ICD-10-CM | POA: Diagnosis not present

## 2020-12-31 DIAGNOSIS — I130 Hypertensive heart and chronic kidney disease with heart failure and stage 1 through stage 4 chronic kidney disease, or unspecified chronic kidney disease: Secondary | ICD-10-CM | POA: Diagnosis not present

## 2021-01-03 ENCOUNTER — Encounter: Payer: Medicare HMO | Admitting: Internal Medicine

## 2021-01-06 ENCOUNTER — Telehealth: Payer: Medicare HMO

## 2021-01-25 ENCOUNTER — Other Ambulatory Visit: Payer: Self-pay

## 2021-01-25 ENCOUNTER — Telehealth: Payer: Self-pay

## 2021-01-25 ENCOUNTER — Telehealth: Payer: Self-pay | Admitting: Nurse Practitioner

## 2021-01-25 DIAGNOSIS — I10 Essential (primary) hypertension: Secondary | ICD-10-CM

## 2021-01-25 MED ORDER — METOPROLOL SUCCINATE ER 50 MG PO TB24: 50.0000 mg | ORAL_TABLET | Freq: Every day | ORAL | 0 refills | Status: DC

## 2021-01-25 NOTE — Progress Notes (Signed)
Chronic Care Management Pharmacy Assistant   Name: Galvin Aversa  MRN: [id number] DOB: [birth year]   Reason for Encounter:Medication Review/ Medication Coordination Call    Recent office visits: None  Recent consult visits: None  Hospital visits:  None in previous 6 months  Medications: Outpatient Encounter Medications as of 01/25/2021  Medication Sig Note   Accu-Chek Softclix Lancets lancets USE AS DIRECTED    Alcohol Swabs (DROPSAFE ALCOHOL PREP) 70 % PADS USE AS DIRECTED TO CHECK BLOOD SUGAR    aspirin EC 81 MG tablet Take 81 mg by mouth at bedtime. 02/05/2020: Wait to start to taking ASA 81 after surgery, will be seen on 02/23/19   Blood Glucose Calibration (ACCU-CHEK GUIDE CONTROL) LIQD Use as directed E11.65    Blood Glucose Monitoring Suppl (ACCU-CHEK GUIDE) w/Device KIT Use to check blood sugars daily E11.65    Coenzyme Q10 200 MG capsule Take 200 mg by mouth at bedtime.     dapagliflozin propanediol (FARXIGA) 10 MG TABS tablet Take 1 tablet (10 mg total) by mouth daily before breakfast.    enalapril (VASOTEC) 10 MG tablet Take 1 tablet (10 mg total) by mouth daily.    fluticasone (FLONASE) 50 MCG/ACT nasal spray Place 2 sprays into both nostrils daily.    insulin degludec (TRESIBA FLEXTOUCH) 100 UNIT/ML FlexTouch Pen Inject 40 Units into the skin at bedtime.    Insulin Pen Needle 32G X 4 MM MISC 1 Device by Does not apply route in the morning, at noon, in the evening, and at bedtime.    meclizine (ANTIVERT) 25 MG tablet Take 1 tablet (25 mg total) by mouth 3 (three) times daily as needed.    melatonin 1 MG TABS tablet Take 1 mg by mouth at bedtime.    metoprolol succinate (TOPROL-XL) 50 MG 24 hr tablet Take 1 tablet (50 mg total) by mouth at bedtime. Please keep upcoming appt in February 2023 with Cardiologist before anymore refills. Thank you    nitroGLYCERIN (NITROSTAT) 0.4 MG SL tablet Place 1 tablet (0.4 mg total) under the tongue every 5 (five) minutes as needed.  Chest pain 01/28/2020: Per pt last taken nitro 01/ 2020   omeprazole (PRILOSEC) 40 MG capsule TAKE 1 CAPSULE EVERY DAY BEFORE A MEAL    simvastatin (ZOCOR) 40 MG tablet Take 1 tablet (40 mg total) by mouth every evening.    triamcinolone cream (KENALOG) 0.1 % Apply 1 application topically 3 (three) times daily. Apply to rash bid x 7-10 days, then prn when rash return    No facility-administered encounter medications on file as of 01/25/2021.  Reviewed chart for medication changes ahead of medication coordination call.  No OVs, Consults, or hospital visits since last care coordination call/Pharmacist visit.   No medication changes indicated  BP Readings from Last 3 Encounters:  10/27/20 120/74  07/29/20 122/70  04/01/20 128/70    Lab Results  Component Value Date   HGBA1C 8.5 (H) 10/27/2020     Patient obtains medications through Adherence Packaging  90 Days   Last adherence delivery included:  Enalapril 10 mg- 1 tablet daily (evening meal) Simvastatin 40 mg- 1 tablet daily (evening meal) Metoprolol 50 mg- 1 tablet daily (evening meal) Meclizine 25 mg 1 tablet three times daily prn   Patient is due for next adherence delivery on: 02/04/2021 .  This delivery to include:  Enalapril 10 mg- 1 tablet daily (evening meal) Simvastatin 40 mg- 1 tablet daily (evening meal) Metoprolol 50 mg- 1 tablet daily (evening  meal) Omeprazole 40 mg- 1 tablet daily (evening meal)   Patient will not need a short/acute fill   Patient declined the following medications: none  Patient needs refills for Metoprolol 28m. Send refill request to primary care provider.   Called patient to reviewed medications and coordinated delivery but no answer. Pharmacy will try before delivery. Called patient VM not set up 01/25/21 Called patient VM not set up 01/26/21 Called patient 01/28/20 left VM   Care Gaps:  Last eye exam - 10/13/20 Last Diabetic Foot Exam? 06/10/2019 Overdue Colonoscopy-  05/30/2017 Last annual wellness visit? 04/01/2020  Star Rating Drugs: Medication:                Last Fill:         Day Supply Simvastatin 436m        11/09/20     90DS      [facility]  Enalapril 10 mg tablet     12/14/20       90 DS     [facility] Farxiga 10 mg tablet      patient receives medications from [facility]     [facility]ist Assistant ([phone]

## 2021-01-25 NOTE — Telephone Encounter (Signed)
Tried calling schedule Medicare Annual Wellness Visit (AWV) either virtually or in office.    No answer     Last AWV 04/01/20  calendar year [employer]  ; please schedule at anytime with [facility]    This should be a 40 minute visit.

## 2021-01-26 ENCOUNTER — Other Ambulatory Visit: Payer: Self-pay

## 2021-01-28 ENCOUNTER — Telehealth: Payer: Self-pay

## 2021-01-28 NOTE — Chronic Care Management (AMB) (Signed)
error 

## 2021-01-28 NOTE — Chronic Care Management (AMB) (Signed)
Called patient for appointment reminder with [name] CPP on 02-02-2021 at 2:00. Patient's wife informed me that patient has switched PCP and will no longer need [facility]. Updated Coralyn Helling and Felicity Coyer.    Care Gaps: Covid vaccine overdue Shingrix overdue PNA Vac overdue Yearly ophthalmology overdue Yearly foot exam overdue   Star Rating Drug: Simvastatin 40 mg- Last filled 11-09-2020 90 DS upstream Enalapril 10 mg- Last filled 01-12-2021 26 DS upstream Farxiga 10 mg- Last filled  Any gaps in medications fill history? No  [place] Pharmacist Assistant [phone]

## 2021-01-31 ENCOUNTER — Other Ambulatory Visit: Payer: Self-pay | Admitting: Internal Medicine

## 2021-01-31 DIAGNOSIS — I10 Essential (primary) hypertension: Secondary | ICD-10-CM

## 2021-02-02 ENCOUNTER — Telehealth: Payer: Self-pay | Admitting: Nurse Practitioner

## 2021-02-02 ENCOUNTER — Telehealth: Payer: Medicare HMO

## 2021-02-02 NOTE — Telephone Encounter (Signed)
Tried calling both numbers to schedule Medicare Annual Wellness Visit (AWV) either virtually or in office.    No answer    Last AWV 04/01/20 ; please schedule at anytime with [facility]   If patient has [employer]  can schedule calender year   This should be a 45 minute visit.

## 2021-02-03 ENCOUNTER — Other Ambulatory Visit: Payer: Self-pay | Admitting: Nurse Practitioner

## 2021-02-03 DIAGNOSIS — E1165 Type 2 diabetes mellitus with hyperglycemia: Secondary | ICD-10-CM

## 2021-02-16 ENCOUNTER — Telehealth: Payer: Self-pay | Admitting: Nurse Practitioner

## 2021-02-16 NOTE — Telephone Encounter (Signed)
Tried calling patient to  schedule Medicare Annual Wellness Visit (AWV) either virtually or in office.  No answer on both numbers  Last AWV  04/01/20  please schedule at anytime with [facility]    This should be a 45 minute visit.

## 2021-02-23 ENCOUNTER — Other Ambulatory Visit: Payer: Self-pay

## 2021-02-23 ENCOUNTER — Ambulatory Visit: Payer: Medicare HMO | Admitting: Physician Assistant

## 2021-02-23 ENCOUNTER — Encounter: Payer: Self-pay | Admitting: Physician Assistant

## 2021-02-23 VITALS — BP 130/64 | HR 47 | Ht 68.0 in | Wt 188.2 lb

## 2021-02-23 DIAGNOSIS — I2510 Atherosclerotic heart disease of native coronary artery without angina pectoris: Secondary | ICD-10-CM

## 2021-02-23 DIAGNOSIS — R001 Bradycardia, unspecified: Secondary | ICD-10-CM | POA: Diagnosis not present

## 2021-02-23 DIAGNOSIS — I10 Essential (primary) hypertension: Secondary | ICD-10-CM

## 2021-02-23 DIAGNOSIS — E1165 Type 2 diabetes mellitus with hyperglycemia: Secondary | ICD-10-CM | POA: Diagnosis not present

## 2021-02-23 DIAGNOSIS — E785 Hyperlipidemia, unspecified: Secondary | ICD-10-CM

## 2021-02-23 DIAGNOSIS — Z794 Long term (current) use of insulin: Secondary | ICD-10-CM

## 2021-02-23 MED ORDER — METOPROLOL SUCCINATE ER 25 MG PO TB24: 25.0000 mg | ORAL_TABLET | Freq: Every day | ORAL | 3 refills | Status: AC

## 2021-02-23 NOTE — Progress Notes (Signed)
Office Visit    Patient Name: Gregory Friedman Date of Encounter: 02/23/2021  PCP:  Minette Brine, [facility]  Cardiologist:  Dorris Carnes, MD  Advanced Practice Provider:  No care team member to display Electrophysiologist:  None    HPI    Gregory Friedman is a 71 y.o. male with a hx of known CAD with prior PCI and CABG (2005 in [place]), hypertension, type 2 diabetes mellitus, hyperlipidemia, head and neck CA (status post XRT/surgery/chemo) and anxiety presents today for annual follow-up appointment.  The patient had a cardiac catheterization in 2012 which showed LIMA to LAD atretic, SVG to OM occlusion.  There were multiple areas which could be the culprit for his chest pain/ischemia.  He had a Myoview done in 2017 which showed no ischemia.  EF by echocardiogram was 45 to 50%.  He was seen in March 2020 by Dr. Harrington Challenger and was doing okay at that time.  He was last seen in the office December 2021 for preop clearance for TURP.  At that time he had no chest pain or shortness of breath.  He did not have any dizziness or lightheadedness.  He did not report any syncope or near syncope.  He was riding his bike 6 miles a day and doing yard work without any issues.  His blood pressure was well controlled.  He was tolerating all his medications.  Today, he feels pretty good.  Denies any chest pain or shortness of breath.  He has not had any lightheadedness or dizziness.  He exercises about 30 to 35 minutes a day biking.  He does not have any syncope or near syncope.  His biggest concern is his lack of appetite since starting his new diabetic medication.  He states his blood glucose has been well controlled anywhere from 80s to 105.  He also states that his blood pressure has been well controlled.  His enalapril has been increased which has made a difference.  His EKG shows sinus bradycardia, rate 47 bpm.  He does have a history of sinus bradycardia and is  asymptomatic however, his heart rate is usually in the 50s.  We discussed decreasing his metoprolol succinate down to 25 mg daily to help increase his heart rate slightly.  He has been maintaining a heart healthy diet when he is able to eat a full meal.  I will defer to his primary care provider for any medication changes needed for his diabetes.  He gets his A1c drawn next month and has a follow-up appointment.  Reports no shortness of breath nor dyspnea on exertion. Reports no chest pain, pressure, or tightness. No edema, orthopnea, PND. Reports no palpitations.    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,  s/p chemo/ radiation but stopped due to mucositis;  residual limited mouth opening and neck ROM;  pt released from oncologist, dr Alvy Bimler , lov note in epic 05-23-2013   CKD (chronic kidney disease), stage III ([place])    Coronary artery disease cardiologist--- dr Lizbeth Bark   remote hx MI;  s/p  PCI and stenting  in 2003 x2 to LAD ;  s/p CABG x2 in 2004 (LIMA --LAD,  SVG--OM);  s/p  PCI stenting x2 to LAD/ OM;  cardiac cath 05-25-2010;  stress test 07-15-2015 no ischemia, ef 45-50%, intermediate risk   Decreased range of motion of neck    s/p  radical neck dissection for cancer in 2014   Depression    Diverticulosis    GERD (gastroesophageal reflux disease)    History of colon polyps    History of concussion 01/2018   per pt had vertigo fell and hit head, no loc, no residual   History of esophageal stricture 02/16/2011   s/p  dilatation   History of gout    toe   History of MI (myocardial infarction) 2003   s/p  PCI and stenting   History of squamous cell  carcinoma 2014   left buccal mucosa   Hypertension    followed by cardiology and pcp   Hypothyroidism, unspecified    followed by pcp---  per pt never been on medication   Limitation of opening of mouth    s/p  radical neck dissection due to cancer in 2014,  (01-28-2020  pt can get 2 finger's, but not 3)   Limited joint range of motion    limited mouth opening and neck ROM s/p radical neck dissection   Mixed hyperlipidemia    PONV (postoperative nausea and vomiting)    Psoriasis    S/P CABG x 2 2004   S/P drug eluting coronary stent placement    2003  x2 to LAD;  2006 post cabg , des x2 to LAD/ OM   S/P radiation therapy    Received 8 fractions, then stopped due to mucositis with Radiation/ Cisplatin   Type 2 diabetes mellitus treated with insulin ([place])    followed by pcp----  (01-28-2020 per pt checks blood sugar daily in am,  fasting sugar--- 100-130)   Vertigo, intermittent    Past Surgical History:  Procedure Laterality Date   CARDIAC CATHETERIZATION  05-25-2010  dr Mamie NickHarrington Challenger   LIMA-- LAD atretiz,  SVG--OM occluded, multiple areas which could be cause chest pain or ischemia (severe disease distal LCx or moderate disease Diagonal)   COLONOSCOPY WITH PROPOFOL  last one 05-30-2017  dr Carlean Purl   CORONARY ANGIOPLASTY WITH STENT PLACEMENT  2003   PCI and stent x2 to LAD   CORONARY ANGIOPLASTY WITH STENT PLACEMENT  10/ 2006  dr Lizbeth Bark   PCI and DES x2  to LAD and OM   CORONARY ARTERY BYPASS GRAFT  2004  in [place]   LIMA --- LAD,  SVG -- OM   CYSTOSCOPY  02/09/2011   Procedure: CYSTOSCOPY;  Surgeon: Marissa Nestle, MD;  Location: [facility];  Service: Urology;  Laterality: N/A;   ESOPHAGOGASTRODUODENOSCOPY  02-16-2011  dr Carlean Purl   ESOPHAGOGASTRODUODENOSCOPY (EGD) WITH ESOPHAGEAL DILATION  02/16/2011   with Propofol   FOOT ARTHRODESIS, SUBTALAR Right 10/13/2009   MASS EXCISION N/A 03/13/2012   Procedure: EXCISION LEFT BUCCAL MUCOSA;  Surgeon: Rozetta Nunnery, MD;  Location:  [facility];  Service: ENT;  Laterality: N/A;   RADICAL NECK DISSECTION Left 03/13/2012   Procedure: RADICAL NECK DISSECTION;  Surgeon: Rozetta Nunnery, MD;  Location: [facility];  Service: ENT;  Laterality: Left;   TOOTH EXTRACTION N/A 03/13/2012   Procedure: EXTRACTION MOLARS;  Surgeon: Rozetta Nunnery, MD;  Location: [facility];  Service: ENT;  Laterality: N/A;   TRANSURETHRAL RESECTION OF PROSTATE N/A  01/30/2020   Procedure: TRANSURETHRAL RESECTION OF THE PROSTATE (TURP);  Surgeon: Janith Lima, MD;  Location[facility];  Service: Urology;  Laterality: N/A;   TRIGGER FINGER RELEASE Right 02/24/2016   Procedure: RELEASE TRIGGER FINGER/A-1 PULLEY RIGHT THUMB;  Surgeon: Daryll Brod, MD;  Location: [facility];  Service: Orthopedics;  Laterality: Right;  FAB    Allergies  Allergies  Allergen Reactions   Trulicity [Dulaglutide]     Anorexia, shaking, constipation      EKGs/Labs/Other Studies Reviewed:   The following studies were reviewed today: Myoview 07/15/15 IMPRESSION: 1. Apical infarct without definite peri-infarct ischemia. 2. Associated apical hypokinesis. 3. Left ventricular ejection fraction 47% 4. Non invasive risk stratification*: Intermediate     Monitor Study Highlights 2017   Sinus rhythm  No arrhythmias detected       Cardiac Catheterization 05/25/10 LM dist 20 LAD prox, mid, dist 30; mid stent patent; Dx mid 60 LCx dist 100 (L-L collats); OM1 (small) 50; OM2 stent patent; OM3 70 at trifurcation of 3 sub-branches,  RCA mid 50; PLB diffuse 40 L-LAD atretic S-OM2 100 EF 55 Med Rx       EKG:  EKG is  ordered today.  The ekg ordered today demonstrates Sinus bradycardia, rate 47 bpm  Recent Labs: 04/01/2020: Hemoglobin 13.2; Platelets 200 07/29/2020: ALT 15; TSH 2.080 10/27/2020: BUN 14; Creatinine, Ser 1.50; Potassium 4.9; Sodium 141  Recent Lipid Panel    Component Value Date/Time   CHOL 139 10/27/2020 1643   TRIG 142 10/27/2020 1643    HDL 46 10/27/2020 1643   CHOLHDL 3.0 10/27/2020 1643   CHOLHDL 2.6 04/19/2015 0001   VLDL 27 04/19/2015 0001   LDLCALC 68 10/27/2020 1643     Home Medications   Current Meds  Medication Sig   ACCU-CHEK GUIDE test strip    Accu-Chek Softclix Lancets lancets USE AS DIRECTED   Alcohol Swabs (DROPSAFE ALCOHOL PREP) 70 % PADS USE AS DIRECTED WHEN CHECKING BLOOD SUGAR   aspirin EC 81 MG tablet Take 81 mg by mouth at bedtime.   Blood Glucose Calibration (ACCU-CHEK GUIDE CONTROL) LIQD Use as directed E11.65   Blood Glucose Monitoring Suppl (ACCU-CHEK GUIDE) w/Device KIT Use to check blood sugars daily E11.65   Coenzyme Q10 200 MG capsule Take 200 mg by mouth at bedtime.    dapagliflozin propanediol (FARXIGA) 10 MG TABS tablet Take two tablets by mouth (20 mg total) at bedtime   enalapril (VASOTEC) 20 MG tablet Take 20 mg by mouth at bedtime.   fluticasone (FLONASE) 50 MCG/ACT nasal spray Place 2 sprays into both nostrils daily.   glucose blood (PRECISION QID TEST) test strip See admin instructions.   insulin degludec (TRESIBA FLEXTOUCH) 100 UNIT/ML FlexTouch Pen Inject 40 Units into the skin at bedtime.   Insulin Pen Needle 32G X 4 MM MISC 1 Device by Does not apply route in the morning, at noon, in the evening, and at bedtime.   meclizine (ANTIVERT) 25 MG tablet Take 1 tablet (25 mg total) by mouth 3 (three) times daily as needed.   melatonin 1 MG TABS tablet Take 1 mg by mouth at bedtime.   metoprolol succinate (TOPROL XL) 25 MG 24 hr tablet Take 1 tablet (25 mg total) by mouth daily.   nitroGLYCERIN (NITROSTAT) 0.4 MG SL tablet Place 1 tablet (0.4 mg total) under the tongue every 5 (five) minutes as needed. Chest pain   omeprazole (PRILOSEC) 40 MG capsule TAKE 1 CAPSULE EVERY DAY  BEFORE A MEAL   simvastatin (ZOCOR) 40 MG tablet Take 1 tablet (40 mg total) by mouth every evening.   triamcinolone cream (KENALOG) 0.1 % Apply 1 application topically 3 (three) times daily. Apply to rash bid x  7-10 days, then prn when rash return   [DISCONTINUED] metoprolol succinate (TOPROL-XL) 50 MG 24 hr tablet Take 1 tablet (50 mg total) by mouth at bedtime. Please keep upcoming appt in February 2023 with Cardiologist before anymore refills. Thank you     Review of Systems      All other systems reviewed and are otherwise negative except as noted above.  Physical Exam    VS:  BP 130/64 (BP Location: Right Arm, Patient Position: Sitting, Cuff Size: Normal)    Pulse (!) 47    Ht 5' 8"  (1.727 m)    Wt 188 lb 3.2 oz (85.4 kg)    SpO2 98%    BMI 28.62 kg/m  , BMI Body mass index is 28.62 kg/m.  Wt Readings from Last 3 Encounters:  02/23/21 188 lb 3.2 oz (85.4 kg)  10/27/20 196 lb 6.4 oz (89.1 kg)  07/29/20 194 lb 6.4 oz (88.2 kg)     GEN: Well nourished, well developed, in no acute distress. HEENT: normal. Neck: Supple, no JVD, carotid bruits, or masses. Cardiac: sinus bradycardia, no murmurs, rubs, or gallops. No clubbing, cyanosis, edema.  Radials/PT 2+ and equal bilaterally.  Respiratory:  Respirations regular and unlabored, clear to auscultation bilaterally. GI: Soft, nontender, nondistended. MS: No deformity or atrophy. Skin: Warm and dry, no rash. Neuro:  Strength and sensation are intact. Psych: Normal affect.  Assessment & Plan    Coronary artery disease -No chest pain -No ischemic work-up indicated at this time  Hypertension -BP 130/64 -BP has been well controlled per the patient -Continue low-sodium, heart healthy diet  Hyperlipidemia -Lipid panel October 2022 showed total cholesterol 139, HDL 46, LDL 68, triglycerides 142 -LDL currently at goal. Would recommend repeating Oct 2023  Diabetes mellitus type 2 -Last hemoglobin A1c back in October 2022 was 8.5 -We will refer to primary care for better control -He has a follow-up appointment next month and they will re-draw A1c  Bradycardia -Sinus bradycardia on EKG with HR 47 bpm -Decrease metoprolol succinate to 25  mg  -Continue to monitor your BP    Disposition: Follow up 1 year with Dorris Carnes, MD or APP.  Signed, Elgie Collard, PA-C 02/23/2021, 4:30 PM [facility]

## 2021-02-23 NOTE — Patient Instructions (Signed)
Medication Instructions:   DECREASE Toprol one (1) tablet by mouth ( 25 mg) daily.   *If you need a refill on your cardiac medications before your next appointment, please call your pharmacy*   Lab Work:  None ordered.  If you have labs (blood work) drawn today and your tests are completely normal, you will receive your results only by: [place] (if you have MyChart) OR A paper copy in the mail If you have any lab test that is abnormal or we need to change your treatment, we will call you to review the results.   Testing/Procedures:  None ordered.   Follow-Up: At [facility], you and your health needs are our priority.  As part of our continuing mission to provide you with exceptional heart care, we have created designated Provider Care Teams.  These Care Teams include your primary Cardiologist (physician) and Advanced Practice Providers (APPs -  Physician Assistants and Nurse Practitioners) who all work together to provide you with the care you need, when you need it.  We recommend signing up for the patient portal called "MyChart".  Sign up information is provided on this After Visit Summary.  MyChart is used to connect with patients for Virtual Visits (Telemedicine).  Patients are able to view lab/test results, encounter notes, upcoming appointments, etc.  Non-urgent messages can be sent to your provider as well.   To learn more about what you can do with MyChart, go to [email or web address].    Your next appointment:   1 year(s)  The format for your next appointment:   In Person  Provider:   Dorris Carnes, MD     Other Instructions  Your physician wants you to follow-up in: 1 year with Dr. Harrington Challenger.  You will receive a reminder letter in the mail two months in advance. If you don't receive a letter, please call our office to schedule the follow-up appointment.

## 2021-02-28 ENCOUNTER — Telehealth: Payer: Self-pay | Admitting: Nurse Practitioner

## 2021-02-28 NOTE — Telephone Encounter (Signed)
Spoke with patient to schedule awv.    He was checking on his tresibia u200  he stated he is on his last pen now.    He stated he picks this up at the office.  Patient stated he also spoke with Pearson about this last week  He stated is blood sugar is running between 80-100 since starting on  farxiga   Please advise  [phone]

## 2021-02-28 NOTE — Telephone Encounter (Signed)
Left message for patient to call back and schedule Medicare Annual Wellness Visit (AWV) either virtually or in office.  Left both my jabber number [phone] and office number    Last AWV ;04/01/20 please schedule at anytime with [place] can do awv calendar year     This should be a 45 minute visit.

## 2021-03-02 ENCOUNTER — Telehealth: Payer: Self-pay | Admitting: Nurse Practitioner

## 2021-03-02 NOTE — Telephone Encounter (Signed)
Received information that patient's wife reported he had transferred care to [facility]. Attempted to call patient to confirm no answer and unable to leave a message due to voicemail not set up.

## 2021-03-09 ENCOUNTER — Telehealth: Payer: Self-pay

## 2021-03-09 NOTE — Chronic Care Management (AMB) (Signed)
Chronic Care Management Pharmacy Assistant   Name: Gregory Friedman  MRN: [id number] DOB: [birth year]   Reason for Encounter: Status follow up    Medications: Outpatient Encounter Medications as of 03/09/2021  Medication Sig   ACCU-CHEK GUIDE test strip    Accu-Chek Softclix Lancets lancets USE AS DIRECTED   Alcohol Swabs (DROPSAFE ALCOHOL PREP) 70 % PADS USE AS DIRECTED WHEN CHECKING BLOOD SUGAR   aspirin EC 81 MG tablet Take 81 mg by mouth at bedtime.   Blood Glucose Calibration (ACCU-CHEK GUIDE CONTROL) LIQD Use as directed E11.65   Blood Glucose Monitoring Suppl (ACCU-CHEK GUIDE) w/Device KIT Use to check blood sugars daily E11.65   Coenzyme Q10 200 MG capsule Take 200 mg by mouth at bedtime.    dapagliflozin propanediol (FARXIGA) 10 MG TABS tablet Take two tablets by mouth (20 mg total) at bedtime   enalapril (VASOTEC) 20 MG tablet Take 20 mg by mouth at bedtime.   fluticasone (FLONASE) 50 MCG/ACT nasal spray Place 2 sprays into both nostrils daily.   glucose blood (PRECISION QID TEST) test strip See admin instructions.   insulin degludec (TRESIBA FLEXTOUCH) 100 UNIT/ML FlexTouch Pen Inject 40 Units into the skin at bedtime.   Insulin Pen Needle 32G X 4 MM MISC 1 Device by Does not apply route in the morning, at noon, in the evening, and at bedtime.   meclizine (ANTIVERT) 25 MG tablet Take 1 tablet (25 mg total) by mouth 3 (three) times daily as needed.   melatonin 1 MG TABS tablet Take 1 mg by mouth at bedtime.   metoprolol succinate (TOPROL XL) 25 MG 24 hr tablet Take 1 tablet (25 mg total) by mouth daily.   nitroGLYCERIN (NITROSTAT) 0.4 MG SL tablet Place 1 tablet (0.4 mg total) under the tongue every 5 (five) minutes as needed. Chest pain   omeprazole (PRILOSEC) 40 MG capsule TAKE 1 CAPSULE EVERY DAY BEFORE A MEAL   simvastatin (ZOCOR) 40 MG tablet Take 1 tablet (40 mg total) by mouth every evening.   triamcinolone cream (KENALOG) 0.1 % Apply 1 application topically 3  (three) times daily. Apply to rash bid x 7-10 days, then prn when rash return   No facility-administered encounter medications on file as of 03/09/2021.   03-08-2021: Contacted patient's spouse to clarify if patient has changed PCP and was told to contact patient instead and expressed how it's hard getting in contact with anyone from [facility]. Explained to patient's spouse that patient's phone only rings once with no voicemail and it's been numerous of attempts to contact. Patient's spouse then expressed that she spoke with someone recently who was "rude" when asked about tresiba basically saying if patient is no longer with practice, [name] can no longer be responsible for patient assistance medications. Patient's spouse stated she will have patient to call back.  03-09-2021: Spoke with patient and he is now saying he has a new [place] card with [employer] on it and one with [name] and he would like to clarify which one is correct. I then explained to patient it is his decision on what practice he attends to and I just needed to confirm so we could continue with patient assistance. Patient then stated how it is hard to get incontact with anyone at [facility] and I told repeated what was said to spouse the day before. Patient stated he has a block on his phone for robo calls. I then told him that it's been several failed contact attempts due  the block on his phone and no voicemail. Patient also stated he hasn't had an appointment with [name] or [facility] all year. I then told patient that appointments were made but canceled on mychart and patient had no response. Patient stated he will contact me once he speaks to human. Updated Vallie Pearson CPP  ° °Malecca Hicks CMA °Clinical Pharmacist Assistant °[phone] ° °

## 2021-04-22 ENCOUNTER — Other Ambulatory Visit: Payer: Self-pay | Admitting: Internal Medicine

## 2021-04-22 DIAGNOSIS — I10 Essential (primary) hypertension: Secondary | ICD-10-CM

## 2021-04-25 ENCOUNTER — Telehealth: Payer: Self-pay

## 2021-04-25 NOTE — Chronic Care Management (AMB) (Signed)
? ? ?  Chronic Care Management ?Pharmacy Assistant  ? ?Name: Gregory Friedman  MRN: [id number] DOB: 03/31/1950 ? ?Reason for Encounter: Medication Review/ Medication Coordination ?  ? ?Medications: ?Outpatient Encounter Medications as of 04/25/2021  ?Medication Sig  ? ACCU-CHEK GUIDE test strip   ? Accu-Chek Softclix Lancets lancets USE AS DIRECTED  ? Alcohol Swabs (DROPSAFE ALCOHOL PREP) 70 % PADS USE AS DIRECTED WHEN CHECKING BLOOD SUGAR  ? aspirin EC 81 MG tablet Take 81 mg by mouth at bedtime.  ? Blood Glucose Calibration (ACCU-CHEK GUIDE CONTROL) LIQD Use as directed E11.65  ? Blood Glucose Monitoring Suppl (ACCU-CHEK GUIDE) w/Device KIT Use to check blood sugars daily E11.65  ? Coenzyme Q10 200 MG capsule Take 200 mg by mouth at bedtime.   ? dapagliflozin propanediol (FARXIGA) 10 MG TABS tablet Take two tablets by mouth (20 mg total) at bedtime  ? enalapril (VASOTEC) 20 MG tablet Take 20 mg by mouth at bedtime.  ? fluticasone (FLONASE) 50 MCG/ACT nasal spray Place 2 sprays into both nostrils daily.  ? glucose blood (PRECISION QID TEST) test strip See admin instructions.  ? insulin degludec (TRESIBA FLEXTOUCH) 100 UNIT/ML FlexTouch Pen Inject 40 Units into the skin at bedtime.  ? Insulin Pen Needle 32G X 4 MM MISC 1 Device by Does not apply route in the morning, at noon, in the evening, and at bedtime.  ? meclizine (ANTIVERT) 25 MG tablet Take 1 tablet (25 mg total) by mouth 3 (three) times daily as needed.  ? melatonin 1 MG TABS tablet Take 1 mg by mouth at bedtime.  ? metoprolol succinate (TOPROL XL) 25 MG 24 hr tablet Take 1 tablet (25 mg total) by mouth daily.  ? nitroGLYCERIN (NITROSTAT) 0.4 MG SL tablet Place 1 tablet (0.4 mg total) under the tongue every 5 (five) minutes as needed. Chest pain  ? omeprazole (PRILOSEC) 40 MG capsule TAKE 1 CAPSULE EVERY DAY BEFORE A MEAL  ? simvastatin (ZOCOR) 40 MG tablet Take 1 tablet (40 mg total) by mouth every evening.  ? triamcinolone cream (KENALOG) 0.1 % Apply 1  application topically 3 (three) times daily. Apply to rash bid x 7-10 days, then prn when rash return  ? ?No facility-administered encounter medications on file as of 04/25/2021.  ? ? ?BP Readings from Last 3 Encounters:  ?02/23/21 130/64  ?10/27/20 120/74  ?07/29/20 122/70  ?  ?Lab Results  ?Component Value Date  ? HGBA1C 8.5 (H) 10/27/2020  ?  ? ?04-25-2021: contacted patient for delivery and patient wishes to unenroll with [facility]. Updated team. ? ?Malecca Hicks CMA ?Clinical Pharmacist Assistant ?[phone] ? ?

## 2021-07-16 ENCOUNTER — Other Ambulatory Visit: Payer: Self-pay | Admitting: Nurse Practitioner

## 2021-07-16 DIAGNOSIS — E1165 Type 2 diabetes mellitus with hyperglycemia: Secondary | ICD-10-CM

## 2021-08-08 ENCOUNTER — Other Ambulatory Visit: Payer: Self-pay | Admitting: Internal Medicine

## 2021-09-05 ENCOUNTER — Telehealth: Payer: Self-pay | Admitting: Internal Medicine

## 2021-09-05 NOTE — Telephone Encounter (Signed)
Patient's wife states they are in [place] and the patient felt bad yesterday so they called EMS. She says he was admitted to [facility] in [place]. She says his cardiac enzymes were very elevated at 1030. She says it has been creeping up and he has a UTI. She says he was also vomiting and had very high fever. She would like the office to contact the hospital, because they have no record of the patient. Phone: [phone],

## 2021-09-05 NOTE — Telephone Encounter (Signed)
Spoke w patient's wife.  She said he had fever, UTI, and now elevated cardiac enzymes.  He has lots of doctors coming in and out and they are taking good care of him. She wanted to let Dr. Harrington Challenger know because they don't have any of his history there.  I assured her he should be well cared for and we can have Dr. Harrington Challenger see him as a follow up.  Scheduled him 10/03/21 w her.

## 2021-09-19 NOTE — Progress Notes (Signed)
This encounter was created in error - please disregard.

## 2021-09-28 ENCOUNTER — Encounter (HOSPITAL_COMMUNITY): Payer: Self-pay

## 2021-09-28 ENCOUNTER — Emergency Department (HOSPITAL_COMMUNITY): Payer: Medicare PPO

## 2021-09-28 ENCOUNTER — Emergency Department (HOSPITAL_COMMUNITY)
Admission: EM | Admit: 2021-09-28 | Discharge: 2021-09-28 | Disposition: A | Discharge status: Home / Self Care | Payer: Medicare PPO | Attending: Student | Admitting: Student

## 2021-09-28 DIAGNOSIS — Z794 Long term (current) use of insulin: Secondary | ICD-10-CM | POA: Insufficient documentation

## 2021-09-28 DIAGNOSIS — K4090 Unilateral inguinal hernia, without obstruction or gangrene, not specified as recurrent: Secondary | ICD-10-CM | POA: Diagnosis not present

## 2021-09-28 DIAGNOSIS — Z7982 Long term (current) use of aspirin: Secondary | ICD-10-CM | POA: Insufficient documentation

## 2021-09-28 LAB — COMPREHENSIVE METABOLIC PANEL
ALT: 24 U/L (ref 0–44)
AST: 23 U/L (ref 15–41)
Albumin: 3.9 g/dL (ref 3.5–5.0)
Alkaline Phosphatase: 70 U/L (ref 38–126)
Anion gap: 8 (ref 5–15)
BUN: 15 mg/dL (ref 8–23)
CO2: 26 mmol/L (ref 22–32)
Calcium: 9.1 mg/dL (ref 8.9–10.3)
Chloride: 103 mmol/L (ref 98–111)
Creatinine, Ser: 1.37 mg/dL — ABNORMAL HIGH (ref 0.61–1.24)
GFR, Estimated: 55 mL/min — ABNORMAL LOW (ref >60)
Glucose, Bld: 311 mg/dL — ABNORMAL HIGH (ref 70–99)
Potassium: 4.5 mmol/L (ref 3.5–5.1)
Sodium: 137 mmol/L (ref 135–145)
Total Bilirubin: 0.7 mg/dL (ref 0.3–1.2)
Total Protein: 7.4 g/dL (ref 6.5–8.1)

## 2021-09-28 LAB — CBC WITH DIFFERENTIAL/PLATELET
Abs Immature Granulocytes: 0.01 10*3/uL (ref 0.00–0.07)
Basophils Absolute: 0.1 10*3/uL (ref 0.0–0.1)
Basophils Relative: 2 %
Eosinophils Absolute: 0.4 10*3/uL (ref 0.0–0.5)
Eosinophils Relative: 9 %
HCT: 42.4 % (ref 39.0–52.0)
Hemoglobin: 13.6 g/dL (ref 13.0–17.0)
Immature Granulocytes: 0 %
Lymphocytes Relative: 27 %
Lymphs Abs: 1.2 10*3/uL (ref 0.7–4.0)
MCH: 27.3 pg (ref 26.0–34.0)
MCHC: 32.1 g/dL (ref 30.0–36.0)
MCV: 85.1 fL (ref 80.0–100.0)
Monocytes Absolute: 0.3 10*3/uL (ref 0.1–1.0)
Monocytes Relative: 6 %
Neutro Abs: 2.5 10*3/uL (ref 1.7–7.7)
Neutrophils Relative %: 56 %
Platelets: 164 10*3/uL (ref 150–400)
RBC: 4.98 MIL/uL (ref 4.22–5.81)
RDW: 13.2 % (ref 11.5–15.5)
WBC: 4.5 10*3/uL (ref 4.0–10.5)
nRBC: 0 % (ref 0.0–0.2)

## 2021-09-28 LAB — URINALYSIS, ROUTINE W REFLEX MICROSCOPIC
Bilirubin Urine: NEGATIVE
Glucose, UA: 50 mg/dL — AB
Hgb urine dipstick: NEGATIVE
Ketones, ur: NEGATIVE mg/dL
Leukocytes,Ua: NEGATIVE
Nitrite: NEGATIVE
Protein, ur: NEGATIVE mg/dL
Specific Gravity, Urine: 1.013 (ref 1.005–1.030)
pH: 5 (ref 5.0–8.0)

## 2021-09-28 MED ORDER — IOHEXOL 300 MG/ML  SOLN
100.0000 mL | Freq: Once | INTRAMUSCULAR | Status: AC | PRN
  Administered 2021-09-28: 100 mL via INTRAVENOUS

## 2021-09-28 NOTE — ED Notes (Signed)
Pt needs IV for CT  

## 2021-09-28 NOTE — ED Provider Notes (Signed)
[facility] DEPT Provider Note   CSN: [id number] Arrival date & time: 09/28/21  1642     History  Chief Complaint  Patient presents with   Inguinal Hernia    Gregory Friedman is a 71 y.o. male who presents emergency department for evaluation of pain from a right inguinal hernia.  Patient was first diagnosed with a hernia approximately 2 years ago.  It has not caused him any problems until about 4 days ago has had progressive increase in swelling and pain and discomfort.  He states that it first occurred when he was getting off of the bed and he felt like he "tore something".  He said he was recently hospitalized ago for urinary tract infection however per chart review, unable to find these records.  He currently denies dysuria, hematuria, abdominal pain, nausea, vomiting and diarrhea.  HPI     Home Medications Prior to Admission medications   Medication Sig Start Date End Date Taking? Authorizing Provider  ACCU-CHEK GUIDE test strip  02/13/21   [provider]  Accu-Chek Softclix Lancets lancets USE AS DIRECTED 02/17/20   [name], MD  Alcohol Swabs (DROPSAFE ALCOHOL PREP) 70 % PADS USE AS DIRECTED WHEN CHECKING BLOOD SUGAR 02/03/21   Minette Brine, FNP  aspirin EC 81 MG tablet Take 81 mg by mouth at bedtime.    [provider]  Blood Glucose Calibration (ACCU-CHEK GUIDE CONTROL) LIQD Use as directed E11.65 05/10/20   Minette Brine, FNP  Blood Glucose Monitoring Suppl (ACCU-CHEK GUIDE) w/Device KIT Use to check blood sugars daily E11.65 05/12/20   Minette Brine, FNP  Coenzyme Q10 200 MG capsule Take 200 mg by mouth at bedtime.     [provider]  dapagliflozin propanediol (FARXIGA) 10 MG TABS tablet Take two tablets by mouth (20 mg total) at bedtime    [provider]  enalapril (VASOTEC) 20 MG tablet Take 20 mg by mouth at bedtime. 02/03/21   [provider]  fluticasone (FLONASE) 50 MCG/ACT nasal spray  Place 2 sprays into both nostrils daily. 11/18/20 11/18/21  Minette Brine, FNP  glucose blood (PRECISION QID TEST) test strip See admin instructions. 01/20/13   [provider]  insulin degludec (TRESIBA FLEXTOUCH) 100 UNIT/ML FlexTouch Pen Inject 40 Units into the skin at bedtime. 10/27/20   Minette Brine, FNP  Insulin Pen Needle 32G X 4 MM MISC 1 Device by Does not apply route in the morning, at noon, in the evening, and at bedtime. 11/25/20   Minette Brine, FNP  meclizine (ANTIVERT) 25 MG tablet Take 1 tablet (25 mg total) by mouth 3 (three) times daily as needed. 12/13/20   Minette Brine, FNP  melatonin 1 MG TABS tablet Take 1 mg by mouth at bedtime.    [provider]  metoprolol succinate (TOPROL XL) 25 MG 24 hr tablet Take 1 tablet (25 mg total) by mouth daily. 02/23/21 02/23/22  Elgie Collard, PA-C  nitroGLYCERIN (NITROSTAT) 0.4 MG SL tablet Place 1 tablet (0.4 mg total) under the tongue every 5 (five) minutes as needed. Chest pain 06/16/16   Fay Records, MD  omeprazole (PRILOSEC) 40 MG capsule TAKE 1 CAPSULE EVERY DAY BEFORE A MEAL 12/05/17   [name], MD  simvastatin (ZOCOR) 40 MG tablet Take 1 tablet (40 mg total) by mouth every evening. 11/25/20   Minette Brine, FNP  triamcinolone cream (KENALOG) 0.1 % Apply 1 application topically 3 (three) times daily. Apply to rash bid x 7-10 days, then  prn when rash return 03/27/19   Rodriguez-Southworth, Sunday Spillers, PA-C      Allergies    Trulicity [dulaglutide]    Review of Systems   Review of Systems  Constitutional:  Negative for fever.  Cardiovascular:  Negative for chest pain.  Gastrointestinal:  Negative for abdominal pain, nausea and vomiting.  Genitourinary:  Negative for dysuria and hematuria.    Physical Exam Updated Vital Signs BP 130/77   Pulse (!) 59   Temp 97.8 F (36.6 C) (Oral)   Resp 16   SpO2 99%  Physical Exam Vitals and nursing note reviewed.  Constitutional:      General: He is not in acute  distress.    Appearance: He is not ill-appearing.  HENT:     Head: Atraumatic.  Eyes:     Conjunctiva/sclera: Conjunctivae normal.  Cardiovascular:     Rate and Rhythm: Normal rate and regular rhythm.     Pulses: Normal pulses.     Heart sounds: No murmur heard. Pulmonary:     Effort: Pulmonary effort is normal. No respiratory distress.     Breath sounds: Normal breath sounds.  Abdominal:     General: Abdomen is flat. There is no distension.     Palpations: Abdomen is soft.     Tenderness: There is no abdominal tenderness.       Comments: Palpable right inguinal hernia, reducible  Musculoskeletal:        General: Normal range of motion.     Cervical back: Normal range of motion.  Skin:    General: Skin is warm and dry.     Capillary Refill: Capillary refill takes less than 2 seconds.  Neurological:     General: No focal deficit present.     Mental Status: He is alert.  Psychiatric:        Mood and Affect: Mood normal.     ED Results / Procedures / Treatments   Labs (all labs ordered are listed, but only abnormal results are displayed) Labs Reviewed  COMPREHENSIVE METABOLIC PANEL - Abnormal; Notable for the following components:      Result Value   Glucose, Bld 311 (*)    Creatinine, Ser 1.37 (*)    GFR, Estimated 55 (*)    All other components within normal limits  URINALYSIS, ROUTINE W REFLEX MICROSCOPIC - Abnormal; Notable for the following components:   Color, Urine STRAW (*)    Glucose, UA 50 (*)    All other components within normal limits  CBC WITH DIFFERENTIAL/PLATELET    EKG None  Radiology CT ABDOMEN PELVIS W CONTRAST  Result Date: 09/28/2021 CLINICAL DATA:  Hernia, complicated right inguinal hernia. Pt presents with c/o right inguinal hernia. Pt reports that he has had it for several years but it became worse over the weekend with pain and swelling. EXAM: CT ABDOMEN AND PELVIS WITH CONTRAST TECHNIQUE: Multidetector CT imaging of the abdomen and pelvis  was performed using the standard protocol following bolus administration of intravenous contrast. RADIATION DOSE REDUCTION: This exam was performed according to the departmental dose-optimization program which includes automated exposure control, adjustment of the mA and/or kV according to patient size and/or use of iterative reconstruction technique. CONTRAST:  120m OMNIPAQUE IOHEXOL 300 MG/ML  SOLN COMPARISON:  CT abdomen pelvis 09/21/2017 FINDINGS: Lower chest: No acute abnormality.  Coronary artery calcification. Hepatobiliary: The hepatic parenchyma is diffusely mildly hypodense compared to the splenic parenchyma consistent with fatty infiltration. No focal liver abnormality. Calcified gallstone noted within the gallbladder lumen.  No gallbladder wall thickening or pericholecystic fluid. No biliary dilatation. Pancreas: No focal lesion. Normal pancreatic contour. No surrounding inflammatory changes. No main pancreatic ductal dilatation. Spleen: Normal in size without focal abnormality. Adrenals/Urinary Tract: No adrenal nodule bilaterally. Bilateral kidneys enhance symmetrically. No hydronephrosis. No hydroureter. The urinary bladder is unremarkable. On delayed imaging, there is no urothelial wall thickening and there are no filling defects in the opacified portions of the bilateral collecting systems or ureters. Stomach/Bowel: Stomach is within normal limits. No evidence of bowel wall thickening or dilatation. Scattered colonic diverticulosis. Appendix appears normal. Vascular/Lymphatic: No abdominal aorta or iliac aneurysm. Moderate to severe atherosclerotic plaque of the aorta and its branches. No abdominal, pelvic, or inguinal lymphadenopathy. Reproductive: Prostate is prominent in size. Status post TURP procedure. Other: No intraperitoneal free fluid. No intraperitoneal free gas. No organized fluid collection. Musculoskeletal: Bilateral trace to small volume fat containing inguinal hernias, right greater  than left. No suspicious lytic or blastic osseous lesions. No acute displaced fracture. IMPRESSION: 1. Bilateral trace to small volume fat containing inguinal hernias, right greater than left. No findings to suggest ischemia or associated bowel obstruction. 2. Cholelithiasis no CT finding of acute cholecystitis. 3. Mild hepatic steatosis. 4.  Aortic Atherosclerosis (ICD10-I70.0). Electronically Signed   By: Iven Finn M.D.   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  Tobacco Use   Smoking status: Never   Smokeless tobacco: Never  Vaping Use   Vaping Use: Never used  Substance and Sexual Activity   Alcohol use: No   Drug use: Never   Sexual activity: Not on file  Other Topics Concern   Not on file  Social History Narrative   Married;  2 children in [place], son in [place]; 6 grandchildren      Social Determinants of Health   Financial Resource Strain: Low Risk  (04/01/2020)   Overall Financial Resource Strain (CARDIA)    Difficulty of Paying Living Expenses: Not hard at all  Recent Concern: Financial Resource Strain - Medium Risk (02/05/2020)   Overall Financial Resource Strain (CARDIA)    Difficulty of Paying Living Expenses: Somewhat hard  Food Insecurity: No Food Insecurity (04/01/2020)   Hunger Vital Sign    Worried About Running Out of Food in the Last Year: Never true    [place] in the Last Year: Never true   Transportation Needs: No Transportation Needs (04/01/2020)   PRAPARE - Hydrologist (Medical): No    Lack of Transportation (Non-Medical): No  Physical Activity: Insufficiently Active (04/01/2020)   Exercise Vital Sign    Days of Exercise per Week: 7 days    Minutes of Exercise per Session: 20 min  Stress: No Stress Concern Present (04/01/2020)  [employer] - Occupational Stress Questionnaire    Feeling of Stress : Not at all  Social Connections: Not on file  Intimate Partner Violence: Not on file     Initial impression:  This patient presents to the ED for concern of inguinal hernia, this involves an extensive number of treatment options, and is a complaint that carries with it a high risk of complications and morbidity.   Differentials include incarcerated or strangulated hernia, mass, testicular torsion  Comorbidities affecting care:  Self-reported recent admission for UTI  Additional history obtained: None  Lab Tests  I Ordered, reviewed, and interpreted labs and EKG.  The pertinent results include:  CMP, CBC and UA unremarkable  Imaging Studies ordered:  I ordered imaging studies including  CT abdomen pelvis with small inguinal fat-containing hernias I independently visualized and interpreted imaging and I agree with the radiologist interpretation.     ED Course/Re-evaluation: Presents in no acute distress and is nontoxic-appearing.  Vitals are without significant abnormality.  On exam, he he has some obvious discomfort when attempting to reduce the hernia, however hernia reduction was successful and he was feeling much better.  Labs were overall reassuring.  CT abdomen pelvis ordered in triage returned identifying fat-containing inguinal hernias.  Discussed with patient that he will likely need surgical consult to have this repaired as this can be an ongoing problem for him.  Advised lifting restrictions and  instructions to avoid straining.  Return precautions were discussed.  Patient expresses understanding and is amenable to plan.  Disposition:  After consideration of the diagnostic results, physical exam, history and the patients response to treatment feel that the patent would benefit from discharge.   Right inguinal hernia: Plan and management as described above. Discharged home in good condition.  Final Clinical Impression(s) / ED Diagnoses Final diagnoses:  Right inguinal hernia    Rx / DC Orders ED Discharge Orders     None         Rodena Piety 09/28/21 2142    Teressa Lower, MD 09/30/21 1234

## 2021-09-28 NOTE — ED Provider Triage Note (Signed)
Emergency Medicine Provider Triage Evaluation Note  Gregory Friedman , a 71 y.o. male  was evaluated in triage.  Pt complains of swelling to right inguinal hernia.  Was diagnosed a few years ago.  Patient states he typically has some mild swelling to the area however over the last 4 days he has had progressive increase in swelling.  Pain starts at his right groin and goes into his right scrotum.  No redness or warmth.  No dysuria hematuria.  No rashes or lesions.  Having bowel movements.  Review of Systems  Positive: hernia Negative:   Physical Exam  BP (!) 152/81 (BP Location: Left Arm)   Pulse 62   Temp 97.9 F (36.6 C) (Oral)   Resp 18   SpO2 100%  Gen:   Awake, no distress   Resp:  Normal effort  MSK:   Moves extremities without difficulty  Other:    Medical Decision Making  Medically screening exam initiated at 5:06 PM.  Appropriate orders placed.  Gregory Friedman was informed that the remainder of the evaluation will be completed by another provider, this initial triage assessment does not replace that evaluation, and the importance of remaining in the ED until their evaluation is complete.  Hernia   Abrina Petz A, PA-C 09/28/21 1707

## 2021-09-28 NOTE — ED Triage Notes (Signed)
Pt presents with c/o right inguinal hernia. Pt reports that he has had it for several years but it became worse over the weekend with pain and swelling.

## 2021-09-28 NOTE — ED Notes (Signed)
Needs IV

## 2021-09-28 NOTE — Discharge Instructions (Signed)
I am glad that you got some relief with pushing the hernia back in.  Fortunately, your CT shows that your hernia was filled with fat, and not bowel.  If you feel the hernia popped back out, you can feel free to try to push this back into for pain relief.  Ultimately, this would not get better unless you have surgical repair of the hernia.  I have given you a referral to one of our general surgeons.  You can call his office tomorrow to schedule an appointment.

## 2021-09-29 ENCOUNTER — Emergency Department (HOSPITAL_COMMUNITY)
Admission: EM | Admit: 2021-09-29 | Discharge: 2021-09-29 | Disposition: A | Discharge status: Home / Self Care | Payer: Medicare PPO | Attending: Emergency Medicine | Admitting: Emergency Medicine

## 2021-09-29 ENCOUNTER — Encounter (HOSPITAL_COMMUNITY): Payer: Self-pay

## 2021-09-29 ENCOUNTER — Other Ambulatory Visit: Payer: Self-pay

## 2021-09-29 DIAGNOSIS — Z7982 Long term (current) use of aspirin: Secondary | ICD-10-CM | POA: Diagnosis not present

## 2021-09-29 DIAGNOSIS — K4091 Unilateral inguinal hernia, without obstruction or gangrene, recurrent: Secondary | ICD-10-CM | POA: Insufficient documentation

## 2021-09-29 DIAGNOSIS — Z794 Long term (current) use of insulin: Secondary | ICD-10-CM | POA: Diagnosis not present

## 2021-09-29 DIAGNOSIS — R1030 Lower abdominal pain, unspecified: Secondary | ICD-10-CM | POA: Diagnosis present

## 2021-09-29 LAB — CBC WITH DIFFERENTIAL/PLATELET
Abs Immature Granulocytes: 0.02 10*3/uL (ref 0.00–0.07)
Basophils Absolute: 0.1 10*3/uL (ref 0.0–0.1)
Basophils Relative: 1 %
Eosinophils Absolute: 0.3 10*3/uL (ref 0.0–0.5)
Eosinophils Relative: 5 %
HCT: 41.5 % (ref 39.0–52.0)
Hemoglobin: 13.5 g/dL (ref 13.0–17.0)
Immature Granulocytes: 0 %
Lymphocytes Relative: 28 %
Lymphs Abs: 1.3 10*3/uL (ref 0.7–4.0)
MCH: 27.2 pg (ref 26.0–34.0)
MCHC: 32.5 g/dL (ref 30.0–36.0)
MCV: 83.7 fL (ref 80.0–100.0)
Monocytes Absolute: 0.4 10*3/uL (ref 0.1–1.0)
Monocytes Relative: 8 %
Neutro Abs: 2.7 10*3/uL (ref 1.7–7.7)
Neutrophils Relative %: 58 %
Platelets: 159 10*3/uL (ref 150–400)
RBC: 4.96 MIL/uL (ref 4.22–5.81)
RDW: 13.3 % (ref 11.5–15.5)
WBC: 4.7 10*3/uL (ref 4.0–10.5)
nRBC: 0 % (ref 0.0–0.2)

## 2021-09-29 LAB — BASIC METABOLIC PANEL
Anion gap: 7 (ref 5–15)
BUN: 19 mg/dL (ref 8–23)
CO2: 28 mmol/L (ref 22–32)
Calcium: 9.6 mg/dL (ref 8.9–10.3)
Chloride: 108 mmol/L (ref 98–111)
Creatinine, Ser: 1.33 mg/dL — ABNORMAL HIGH (ref 0.61–1.24)
GFR, Estimated: 57 mL/min — ABNORMAL LOW (ref >60)
Glucose, Bld: 123 mg/dL — ABNORMAL HIGH (ref 70–99)
Potassium: 3.7 mmol/L (ref 3.5–5.1)
Sodium: 143 mmol/L (ref 135–145)

## 2021-09-29 MED ORDER — HYDROCODONE-ACETAMINOPHEN 5-325 MG PO TABS
1.0000 | ORAL_TABLET | Freq: Once | ORAL | Status: AC
  Administered 2021-09-29: 1 via ORAL
  Filled 2021-09-29: qty 1

## 2021-09-29 MED ORDER — HYDROCODONE-ACETAMINOPHEN 5-325 MG PO TABS: 1.0000 | ORAL_TABLET | Freq: Three times a day (TID) | ORAL | 0 refills | Status: AC | PRN

## 2021-09-29 MED ORDER — ONDANSETRON 4 MG PO TBDP: 4.0000 mg | ORAL_TABLET | Freq: Three times a day (TID) | ORAL | 0 refills | Status: AC | PRN

## 2021-09-29 NOTE — ED Provider Notes (Signed)
[facility] DEPT Provider Note   CSN: [id number] Arrival date & time: 09/29/21  0955     History  Chief Complaint  Patient presents with   Groin Pain    Gregory Friedman is a 71 y.o. male.  HPI     71 year old male comes in with chief complaint of groin pain. He had come into the emergency room yesterday and found to have an inguinal hernia.  Patient states that he went home, was feeling well and even walked his dog this morning.  However when he sat down, he started having pain in his groin area.  Laying down makes his pain better.  The pain feels like something is tearing inside.  It is severe.  He had hot and cold feeling associated with it.  He also felt like he started having numbness in his hands and legs.  No neck pain or back pain.  He indicates that he had called [place] surgery, but was informed that he will not get an appointment for another month.  Home Medications Prior to Admission medications   Medication Sig Start Date End Date Taking? Authorizing Provider  HYDROcodone-acetaminophen (NORCO/VICODIN) 5-325 MG tablet Take 1 tablet by mouth every 8 (eight) hours as needed for up to 3 days for severe pain. 09/29/21 10/02/21 Yes Yoon Barca, MD  ondansetron (ZOFRAN-ODT) 4 MG disintegrating tablet Take 1 tablet (4 mg total) by mouth every 8 (eight) hours as needed for nausea or vomiting. 09/29/21  Yes Varney Biles, MD  ACCU-CHEK GUIDE test strip  02/13/21   [provider]  Accu-Chek Softclix Lancets lancets USE AS DIRECTED 02/17/20   [name], MD  Alcohol Swabs (DROPSAFE ALCOHOL PREP) 70 % PADS USE AS DIRECTED WHEN CHECKING BLOOD SUGAR 02/03/21   Minette Brine, FNP  aspirin EC 81 MG tablet Take 81 mg by mouth at bedtime.    [provider]  Blood Glucose Calibration (ACCU-CHEK GUIDE CONTROL) LIQD Use as directed E11.65 05/10/20   Minette Brine, FNP  Blood Glucose Monitoring Suppl (ACCU-CHEK GUIDE) w/Device KIT Use to  check blood sugars daily E11.65 05/12/20   Minette Brine, FNP  Coenzyme Q10 200 MG capsule Take 200 mg by mouth at bedtime.     [provider]  dapagliflozin propanediol (FARXIGA) 10 MG TABS tablet Take two tablets by mouth (20 mg total) at bedtime    [provider]  enalapril (VASOTEC) 20 MG tablet Take 20 mg by mouth at bedtime. 02/03/21   [provider]  fluticasone (FLONASE) 50 MCG/ACT nasal spray Place 2 sprays into both nostrils daily. 11/18/20 11/18/21  Minette Brine, FNP  glucose blood (PRECISION QID TEST) test strip See admin instructions. 01/20/13   [provider]  insulin degludec (TRESIBA FLEXTOUCH) 100 UNIT/ML FlexTouch Pen Inject 40 Units into the skin at bedtime. 10/27/20   Minette Brine, FNP  Insulin Pen Needle 32G X 4 MM MISC 1 Device by Does not apply route in the morning, at noon, in the evening, and at bedtime. 11/25/20   Minette Brine, FNP  meclizine (ANTIVERT) 25 MG tablet Take 1 tablet (25 mg total) by mouth 3 (three) times daily as needed. 12/13/20   Minette Brine, FNP  melatonin 1 MG TABS tablet Take 1 mg by mouth at bedtime.    [provider]  metoprolol succinate (TOPROL XL) 25 MG 24 hr tablet Take 1 tablet (25 mg total) by mouth daily. 02/23/21 02/23/22  Elgie Collard, PA-C  nitroGLYCERIN (NITROSTAT) 0.4 MG SL  tablet Place 1 tablet (0.4 mg total) under the tongue every 5 (five) minutes as needed. Chest pain 06/16/16   Fay Records, MD  omeprazole (PRILOSEC) 40 MG capsule TAKE 1 CAPSULE EVERY DAY BEFORE A MEAL 12/05/17   [name], MD  simvastatin (ZOCOR) 40 MG tablet Take 1 tablet (40 mg total) by mouth every evening. 11/25/20   Minette Brine, FNP  triamcinolone cream (KENALOG) 0.1 % Apply 1 application topically 3 (three) times daily. Apply to rash bid x 7-10 days, then prn when rash return 03/27/19   Rodriguez-Southworth, Sunday Spillers, PA-C      Allergies    Trulicity [dulaglutide]    Review of Systems   Review of  Systems  Physical Exam Updated Vital Signs BP 130/71   Pulse (!) 53   Temp 98 F (36.7 C)   Resp (!) 116   Ht 5' 8"  (1.727 m)   Wt 83.9 kg   SpO2 100%   BMI 28.13 kg/m  Physical Exam Vitals and nursing note reviewed.  Constitutional:      Appearance: He is well-developed.  HENT:     Head: Atraumatic.  Cardiovascular:     Rate and Rhythm: Normal rate.  Pulmonary:     Effort: Pulmonary effort is normal.  Abdominal:     Comments: No skin discoloration over the abdominal wall.  Patient has soft abdomen.  Tenderness to palpation over the hernia, but the hernia is reducible  Musculoskeletal:     Cervical back: Neck supple.  Skin:    General: Skin is warm.  Neurological:     Mental Status: He is alert and oriented to person, place, and time.     ED Results / Procedures / Treatments   Labs (all labs ordered are listed, but only abnormal results are displayed) Labs Reviewed  BASIC METABOLIC PANEL - Abnormal; Notable for the following components:      Result Value   Glucose, Bld 123 (*)    Creatinine, Ser 1.33 (*)    GFR, Estimated 57 (*)    All other components within normal limits  CBC WITH DIFFERENTIAL/PLATELET    EKG None  Radiology CT ABDOMEN PELVIS W CONTRAST  Result Date: 09/28/2021 CLINICAL DATA:  Hernia, complicated right inguinal hernia. Pt presents with c/o right inguinal hernia. Pt reports that he has had it for several years but it became worse over the weekend with pain and swelling. EXAM: CT ABDOMEN AND PELVIS WITH CONTRAST TECHNIQUE: Multidetector CT imaging of the abdomen and pelvis was performed using the standard protocol following bolus administration of intravenous contrast. RADIATION DOSE REDUCTION: This exam was performed according to the departmental dose-optimization program which includes automated exposure control, adjustment of the mA and/or kV according to patient size and/or use of iterative reconstruction technique. CONTRAST:  131m OMNIPAQUE  IOHEXOL 300 MG/ML  SOLN COMPARISON:  CT abdomen pelvis 09/21/2017 FINDINGS: Lower chest: No acute abnormality.  Coronary artery calcification. Hepatobiliary: The hepatic parenchyma is diffusely mildly hypodense compared to the splenic parenchyma consistent with fatty infiltration. No focal liver abnormality. Calcified gallstone noted within the gallbladder lumen. No gallbladder wall thickening or pericholecystic fluid. No biliary dilatation. Pancreas: No focal lesion. Normal pancreatic contour. No surrounding inflammatory changes. No main pancreatic ductal dilatation. Spleen: Normal in size without focal abnormality. Adrenals/Urinary Tract: No adrenal nodule bilaterally. Bilateral kidneys enhance symmetrically. No hydronephrosis. No hydroureter. The urinary bladder is unremarkable. On delayed imaging, there is no urothelial wall thickening and there are no filling defects in the opacified  portions of the bilateral collecting systems or ureters. Stomach/Bowel: Stomach is within normal limits. No evidence of bowel wall thickening or dilatation. Scattered colonic diverticulosis. Appendix appears normal. Vascular/Lymphatic: No abdominal aorta or iliac aneurysm. Moderate to severe atherosclerotic plaque of the aorta and its branches. No abdominal, pelvic, or inguinal lymphadenopathy. Reproductive: Prostate is prominent in size. Status post TURP procedure. Other: No intraperitoneal free fluid. No intraperitoneal free gas. No organized fluid collection. Musculoskeletal: Bilateral trace to small volume fat containing inguinal hernias, right greater than left. No suspicious lytic or blastic osseous lesions. No acute displaced fracture. IMPRESSION: 1. Bilateral trace to small volume fat containing inguinal hernias, right greater than left. No findings to suggest ischemia or associated bowel obstruction. 2. Cholelithiasis no CT finding of acute cholecystitis. 3. Mild hepatic steatosis. 4.  Aortic Atherosclerosis (ICD10-I70.0).  Electronically Signed   By: Iven Finn M.D.   On: 09/28/2021 20:06    Procedures Procedures    Medications Ordered in ED Medications  HYDROcodone-acetaminophen (NORCO/VICODIN) 5-325 MG per tablet 1 tablet (1 tablet Oral Given 09/29/21 1510)    ED Course/ Medical Decision Making/ A&P                           Medical Decision Making Amount and/or Complexity of Data Reviewed Labs: ordered.  Risk Prescription drug management.   71 year old male comes in with chief complaint of abdominal pain. He was diagnosed with inguinal hernia yesterday.  I reviewed imaging from yesterday, patient CT scan showed inguinal hernia without any incarceration.  On exam, patient has no sirs criteria upon my assessment.  He has no tachycardia, tachypnea.  Patient does have tenderness over the abdomen, but the hernia appears reducible.  Pain present mostly with palpation.  No concerning systemic symptoms such as nausea, vomiting, diaphoresis and there is no evidence of any skin discoloration over the region.  No tachycardia or tachypnea is also reassuring.  I consider getting a repeat CT, but I do not think it is needed.  Plan is to get basic labs, perform serial abdominal exam and initiate p.o. challenge.  Reassessment: Patient's labs have been independently interpreted.  CBC and BMP is normal.  Patient has not had any nausea or vomiting.  He has passed oral challenge by keeping water down.  I think he needs better pain control for as needed pain and we discussed with him strict ER return precautions.  I also consulted general surgery to get help with close follow-up.  General surgery team kindly will accommodate patient within the next few days, and his appointment was scheduled prior to him departing the ED.  The patient appears reasonably screened and/or stabilized for discharge and I doubt any other medical condition or other [facility] requiring further screening, evaluation, or treatment in the ED  at this time prior to discharge.   Results from the ER workup discussed with the patient face to face and all questions answered to the best of my ability. The patient is safe for discharge with strict return precautions.   Final Clinical Impression(s) / ED Diagnoses Final diagnoses:  Recurrent inguinal hernia without obstruction or gangrene, unspecified laterality    Rx / DC Orders ED Discharge Orders          Ordered    HYDROcodone-acetaminophen (NORCO/VICODIN) 5-325 MG tablet  Every 8 hours PRN        09/29/21 1607    ondansetron (ZOFRAN-ODT) 4 MG disintegrating tablet  Every 8 hours  PRN        09/29/21 [place], Destiny Trickey, MD 09/30/21 5885

## 2021-09-29 NOTE — ED Provider Triage Note (Signed)
Emergency Medicine Provider Triage Evaluation Note  Gregory Friedman , a 71 y.o. male  was evaluated in triage.  Pt complains of right-sided inguinal hernia pain.  Patient was evaluated in the emergency department for the same complaint yesterday and the hernia was reportedly reduced.  Patient was given instructions to follow-up with surgery.  He states he woke up this morning and the pain returned.  He states that since sitting in the "hard chair "in the emergency department he now has numb legs and his head is started to hurt from waiting.  Denies abdominal pain, shortness of breath, chest pain  Review of Systems  Positive: Right inguinal hernia, headache, numb legs (reported to be from sitting in hard chair) Negative: As above  Physical Exam  BP 131/67 (BP Location: Left Arm)   Pulse (!) 55   Temp 98.1 F (36.7 C) (Oral)   Resp 18   Ht '5\' 8"'$  (1.727 m)   Wt 83.9 kg   SpO2 96%   BMI 28.13 kg/m  Gen:   Awake, no distress   Resp:  Normal effort  MSK:   Moves extremities without difficulty  Other:    Medical Decision Making  Medically screening exam initiated at 12:12 PM.  Appropriate orders placed.  Gregory Friedman was informed that the remainder of the evaluation will be completed by another provider, this initial triage assessment does not replace that evaluation, and the importance of remaining in the ED until their evaluation is complete.     Dorothyann Peng, PA-C 09/29/21 1219

## 2021-09-29 NOTE — Discharge Instructions (Addendum)
The surgery team has scheduled an appointment for you on 9-14.  Return to the emergency room if you start having excruciating pain, severe nausea and vomiting, fevers.

## 2021-09-29 NOTE — ED Triage Notes (Signed)
Patient c/o right inguinal hernia pain. Patient reports that he was seen yesterday for the same and pain increased after walking his dog.

## 2021-10-02 NOTE — Progress Notes (Unsigned)
Cardiology Office Note   Date:  10/03/2021   ID:  Gregory Friedman, DOB 04/15/1950, MRN [id number]  PCP:  Arthur Holms, NP  Cardiologist:   Dorris Carnes, MD    F/U of CAD      History of Present Illness: Gregory Friedman is a 71 y.o. male with a history ofCAD s/p PCI and CABG (2005 in [place]), hypertension, type 2 diabetes, dyslipidemia, head and neck CA (s/p XRT/surg/chemo) and anxiety Cath in 2012 which showed LIMA to LAD atretic, SVG to OM occluded. There were multiple areas which could be causing chest pain or ischemia ( severe disease in distal LCx or mod disease in diag).    CP in June 2017  Myovue done   Old infarct   No ischemia    Echo done LVEF 45 to 50% (unchanged)    He fell in Jan 2021  Was bending over to get something in trunk of car  Stood up and got dizzy  Turned then fel   Hit head requiring sutures.       He was last seen in cardiology by Joellyn Rued in Feb 2023    At that visist metoprolol was decreasd due to bradycardia.    Pt was started on Farxiga in early 2023  The pt was hospitalized in [place] recently  for prostatis and UTI  Says he had yeast in his urine.     [name]  stopped  Troponin reportedly elevated   The pt denied CP    Since d/c she denies CP   Breathing is good   He says energy is better   Current Meds  Medication Sig   ACCU-CHEK GUIDE test strip    Accu-Chek Softclix Lancets lancets USE AS DIRECTED   Alcohol Swabs (DROPSAFE ALCOHOL PREP) 70 % PADS USE AS DIRECTED WHEN CHECKING BLOOD SUGAR   aspirin EC 81 MG tablet Take 81 mg by mouth at bedtime.   Blood Glucose Calibration (ACCU-CHEK GUIDE CONTROL) LIQD Use as directed E11.65   Blood Glucose Monitoring Suppl (ACCU-CHEK GUIDE) w/Device KIT Use to check blood sugars daily E11.65   Coenzyme Q10 200 MG capsule Take 200 mg by mouth at bedtime.    dapagliflozin propanediol (FARXIGA) 10 MG TABS tablet Take two tablets by mouth (20 mg total) at bedtime   enalapril (VASOTEC) 20 MG tablet Take 20  mg by mouth at bedtime.   fluticasone (FLONASE) 50 MCG/ACT nasal spray Place 2 sprays into both nostrils daily.   glucose blood (PRECISION QID TEST) test strip See admin instructions.   insulin degludec (TRESIBA FLEXTOUCH) 100 UNIT/ML FlexTouch Pen Inject 40 Units into the skin at bedtime.   Insulin Pen Needle 32G X 4 MM MISC 1 Device by Does not apply route in the morning, at noon, in the evening, and at bedtime.   meclizine (ANTIVERT) 25 MG tablet Take 1 tablet (25 mg total) by mouth 3 (three) times daily as needed.   melatonin 1 MG TABS tablet Take 1 mg by mouth at bedtime.   metoprolol succinate (TOPROL XL) 25 MG 24 hr tablet Take 1 tablet (25 mg total) by mouth daily.   nitroGLYCERIN (NITROSTAT) 0.4 MG SL tablet Place 1 tablet (0.4 mg total) under the tongue every 5 (five) minutes as needed. Chest pain   omeprazole (PRILOSEC) 40 MG capsule TAKE 1 CAPSULE EVERY DAY BEFORE A MEAL   ondansetron (ZOFRAN-ODT) 4 MG disintegrating tablet Take 1 tablet (4 mg total) by mouth every 8 (eight) hours as needed  for nausea or vomiting.   simvastatin (ZOCOR) 40 MG tablet Take 1 tablet (40 mg total) by mouth every evening.   triamcinolone cream (KENALOG) 0.1 % Apply 1 application topically 3 (three) times daily. 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 [facility]   remote hx MI;  s/p  PCI and stenting in 2003 x2 to LAD ;  s/p CABG x2 in 2004 (LIMA --LAD,  SVG--OM);  s/p  PCI stenting x2 to LAD/ OM;  cardiac cath 05-25-2010;  stress test 07-15-2015 no ischemia, ef 45-50%, intermediate risk   Decreased range of motion of neck    s/p  radical neck dissection for cancer in 2014   Depression    Diverticulosis    GERD (gastroesophageal reflux disease)    History of colon polyps    History of concussion 01/2018   per pt had vertigo fell and hit head, no loc, no residual   History of esophageal stricture 02/16/2011   s/p  dilatation   History of gout    toe   History of MI (myocardial infarction) 2003   s/p  PCI and stenting   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   Hypertension    followed by cardiology and pcp   Hypothyroidism, unspecified    followed by pcp---  per pt never been on medication   Limitation of opening of mouth    s/p  radical neck dissection due to cancer in 2014,  (01-28-2020  pt can get 2 finger's, but not 3)   Limited joint range of motion    limited mouth opening and neck ROM s/p radical neck dissection   Mixed hyperlipidemia    PONV (postoperative nausea and vomiting)    Psoriasis    S/P CABG x 2 2004   S/P drug eluting coronary stent placement    2003  x2 to LAD;  2006 post cabg , des x2 to LAD/ OM   S/P radiation therapy    Received 8 fractions, then stopped due to mucositis with Radiation/ Cisplatin   Type 2 diabetes mellitus treated with insulin ([place])    followed by pcp----  (01-28-2020 per pt checks blood sugar daily in am,  fasting sugar--- 100-130)   Vertigo, intermittent     Past Surgical History:  Procedure Laterality Date   CARDIAC CATHETERIZATION  05-25-2010  dr Mamie NickHarrington Challenger   LIMA-- LAD atretiz,  SVG--OM occluded, multiple areas which could be cause chest pain or ischemia (severe disease distal LCx or moderate disease Diagonal)   COLONOSCOPY WITH PROPOFOL  last  one 05-30-2017  dr Carlean Purl   CORONARY ANGIOPLASTY WITH STENT PLACEMENT  2003   PCI and stent x2 to LAD   CORONARY ANGIOPLASTY WITH  STENT PLACEMENT  10/ 2006  dr Lizbeth Bark   PCI and DES x2  to LAD and OM   CORONARY ARTERY BYPASS GRAFT  2004  in [place]   LIMA --- LAD,  SVG -- OM   CYSTOSCOPY  02/09/2011   Procedure: CYSTOSCOPY;  Surgeon: Marissa Nestle, MD;  Location: [facility];  Service: Urology;  Laterality: N/A;   ESOPHAGOGASTRODUODENOSCOPY  02-16-2011  dr Carlean Purl   ESOPHAGOGASTRODUODENOSCOPY (EGD) WITH ESOPHAGEAL DILATION  02/16/2011   with Propofol   FOOT ARTHRODESIS, SUBTALAR Right 10/13/2009   MASS EXCISION N/A 03/13/2012   Procedure: EXCISION LEFT BUCCAL MUCOSA;  Surgeon: Rozetta Nunnery, MD;  Location: [facility];  Service: ENT;  Laterality: N/A;   RADICAL NECK DISSECTION Left 03/13/2012   Procedure: RADICAL NECK DISSECTION;  Surgeon: Rozetta Nunnery, MD;  Location: [facility];  Service: ENT;  Laterality: Left;   TOOTH EXTRACTION N/A 03/13/2012   Procedure: EXTRACTION MOLARS;  Surgeon: Rozetta Nunnery, MD;  Location: [facility];  Service: ENT;  Laterality: N/A;   TRANSURETHRAL RESECTION OF PROSTATE N/A 01/30/2020   Procedure: TRANSURETHRAL RESECTION OF THE PROSTATE (TURP);  Surgeon: Janith Lima, MD;  Location: [facility];  Service: Urology;  Laterality: N/A;   TRIGGER FINGER RELEASE Right 02/24/2016   Procedure: RELEASE TRIGGER FINGER/A-1 PULLEY RIGHT THUMB;  Surgeon: Daryll Brod, MD;  Location: [facility];  Service: Orthopedics;  Laterality: Right;  FAB     Social History:  The patient  reports that he has never smoked. He has never used smokeless tobacco. He reports that he does not drink alcohol and does not use drugs.   Family History:  The patient's family history includes Asthma in his father; Diabetes in his brother, brother, and sister; Heart disease in his brother; Parkinson's disease in his brother; Seizures in his mother; Stroke in his sister;  Thyroid cancer in his daughter.    ROS:  Please see the history of present illness. All other systems are reviewed and  Negative to the above problem except as noted.    PHYSICAL EXAM: VS:  BP 138/76   Pulse (!) 57   Ht _0  (1.676 m)   Wt 186 lb 6.4 oz (84.6 kg)   SpO2 93%   BMI 30.09 kg/m    GEN: Well nourished, well developed, in no acute distress  HEENT:  Scar on R forehead healing well   Neck:  JVP is not eleated  No carotid bruits   Cardiac: RRR; no murmurs  No  LE edema  Respiratory:  clear to auscultation bilaterally, GI: soft, nontender, nondistended, + BS  No hepatomegaly  MS: no deformity Moving all extremities   Skin: warm and dry, no rash Neuro:  Strength and sensation are intact Psych: euthymic mood, full affect   EKG:  EKG is ordered today.    SB 57       Cardiac studies  Echo 01/04/2016 EF 45-50, ant-sept AK, Gr 1 DD, trace MR   Event Monitor 12/22/15 Sinus rhythm  No arrhythmias detected   Myoview 07/15/15 IMPRESSION: 1. Apical infarct without definite peri-infarct ischemia. 2. Associated apical hypokinesis. 3. Left ventricular ejection fraction 47% 4. Non invasive risk stratification*: Intermediate   Cardiac Catheterization 05/25/10 LM dist 20 LAD prox, mid, dist 30; mid stent patent; Dx mid 60 LCx dist 100 (L-L collats); OM1 (small) 50; OM2 stent patent; OM3 70 at trifurcation of 3 sub-branches,  RCA mid 50; PLB diffuse 40 L-LAD atretic  S-OM2 100 EF 55 Med Rx      Lipid Panel    Component Value Date/Time   CHOL 139 10/27/2020 1643   TRIG 142 10/27/2020 1643   HDL 46 10/27/2020 1643   CHOLHDL 3.0 10/27/2020 1643   CHOLHDL 2.6 04/19/2015 0001   VLDL 27 04/19/2015 0001   LDLCALC 68 10/27/2020 1643      Wt Readings from Last 3 Encounters:  10/03/21 186 lb 6.4 oz (84.6 kg)  09/29/21 185 lb (83.9 kg)  02/23/21 188 lb 3.2 oz (85.4 kg)      ASSESSMENT AND PLAN:  1  CAD  Pt denies CP   Recent admit with what appears to be urosepsis   Troponin elevated   Need to get records from [place]      With upcoming surgery will get a Lexiscan myoview to eval ischemia   2  HTN BP is fair  Better at home      3  HL WIll get lipids from [place]    F/U next spring     Current medicines are reviewed at length with the patient today.  The patient does not have concerns regarding medicines.  Signed, Dorris Carnes, MD  10/03/2021 2:33 PM    [address], [place], [place]  [address] Phone: [phone]; Fax: [phone]

## 2021-10-03 ENCOUNTER — Ambulatory Visit: Payer: Medicare PPO | Attending: Internal Medicine | Admitting: Internal Medicine

## 2021-10-03 ENCOUNTER — Encounter: Payer: Self-pay | Admitting: Internal Medicine

## 2021-10-03 VITALS — BP 138/76 | HR 57 | Ht 66.0 in | Wt 186.4 lb

## 2021-10-03 DIAGNOSIS — I2510 Atherosclerotic heart disease of native coronary artery without angina pectoris: Secondary | ICD-10-CM | POA: Diagnosis not present

## 2021-10-03 DIAGNOSIS — E782 Mixed hyperlipidemia: Secondary | ICD-10-CM | POA: Diagnosis not present

## 2021-10-03 NOTE — Patient Instructions (Signed)
Medication Instructions:   *If you need a refill on your cardiac medications before your next appointment, please call your pharmacy*   Lab Work:  If you have labs (blood work) drawn today and your tests are completely normal, you will receive your results only by: [facility] (if you have MyChart) OR A paper copy in the mail If you have any lab test that is abnormal or we need to change your treatment, we will call you to review the results.   Testing/Procedures:  Your physician has requested that you have a lexiscan myoview. For further information please visit [email or web address]. Please follow instruction sheet, as given.     Follow-Up: At [facility], you and your health needs are our priority.  As part of our continuing mission to provide you with exceptional heart care, we have created designated Provider Care Teams.  These Care Teams include your primary Cardiologist (physician) and Advanced Practice Providers (APPs -  Physician Assistants and Nurse Practitioners) who all work together to provide you with the care you need, when you need it.  We recommend signing up for the patient portal called "MyChart".  Sign up information is provided on this After Visit Summary.  MyChart is used to connect with patients for Virtual Visits (Telemedicine).  Patients are able to view lab/test results, encounter notes, upcoming appointments, etc.  Non-urgent messages can be sent to your provider as well.   To learn more about what you can do with MyChart, go to [email or web address].    Your next appointment:   7 month(s)  The format for your next appointment:   In Person  Provider:   Dorris Carnes, MD     Other Instructions   Important Information About Sugar

## 2021-10-06 ENCOUNTER — Telehealth: Payer: Self-pay

## 2021-10-06 ENCOUNTER — Telehealth: Payer: Self-pay | Admitting: *Deleted

## 2021-10-06 NOTE — Telephone Encounter (Signed)
Spoke with the patient, detailed instructions given. He stated that he would be here for his test. Asked to call back with any questions. S.Gloriajean Okun EMTP 

## 2021-10-06 NOTE — Telephone Encounter (Signed)
   Pre-operative Risk Assessment    Patient Name: Gregory Friedman  DOB: [birth year] MRN: [id number]      Request for Surgical Clearance    Procedure:   HERNIA REPAIR  Date of Surgery:  Clearance TBD                                 Surgeon:  DR. PAUL STECHSCHULTE Surgeon's Group or Practice Name:  [facility] Phone number:  [phone] Fax number:  [phone]   Type of Clearance Requested:   - Medical  - Pharmacy:  Hold Aspirin NOT INDICATED   Type of Anesthesia:  General    Additional requests/questions:    Astrid Divine   10/06/2021, 3:45 PM

## 2021-10-07 NOTE — Telephone Encounter (Signed)
Had office visit with Dr. Harrington Challenger on 10/03/2021.  Lexiscan Myoview has been ordered for clearance.  Will await test results.

## 2021-10-11 ENCOUNTER — Ambulatory Visit (HOSPITAL_COMMUNITY): Payer: Medicare PPO | Attending: Internal Medicine

## 2021-10-11 DIAGNOSIS — E782 Mixed hyperlipidemia: Secondary | ICD-10-CM | POA: Insufficient documentation

## 2021-10-11 DIAGNOSIS — I2510 Atherosclerotic heart disease of native coronary artery without angina pectoris: Secondary | ICD-10-CM | POA: Insufficient documentation

## 2021-10-11 LAB — MYOCARDIAL PERFUSION IMAGING
LV dias vol: 80 mL (ref 62–150)
LV sys vol: 37 mL
Nuc Stress EF: 53 %
Peak HR: 72 {beats}/min
Rest HR: 51 {beats}/min
Rest Nuclear Isotope Dose: 10.3 mCi
SDS: 5
SRS: 7
SSS: 14
ST Depression (mm): 0 mm
Stress Nuclear Isotope Dose: 31.9 mCi
TID: 1.06

## 2021-10-11 MED ORDER — TECHNETIUM TC 99M TETROFOSMIN IV KIT
31.9000 | PACK | Freq: Once | INTRAVENOUS | Status: AC | PRN
  Administered 2021-10-11: 31.9 via INTRAVENOUS

## 2021-10-11 MED ORDER — TECHNETIUM TC 99M TETROFOSMIN IV KIT
10.3000 | PACK | Freq: Once | INTRAVENOUS | Status: AC | PRN
  Administered 2021-10-11: 10.3 via INTRAVENOUS

## 2021-10-11 MED ORDER — REGADENOSON 0.4 MG/5ML IV SOLN
0.4000 mg | Freq: Once | INTRAVENOUS | Status: AC
  Administered 2021-10-11: 0.4 mg via INTRAVENOUS

## 2021-10-12 NOTE — Telephone Encounter (Signed)
   Patient Name: Gregory Friedman  DOB: 01-Jul-1950 MRN: [id number]  Primary Cardiologist: Dorris Carnes, MD  Chart reviewed as part of pre-operative protocol coverage. Per notes, per Dr. Harrington Challenger, "Leane Call shows no change from previous   NO ischemia  Old area of scar.   LVEF 53%. She says these are good results and you are cleared for surgery from a Cardiac Standpoint." Per discussion with Dr. Harrington Challenger in secure chat, OK to hold aspirin if needed for hernia repair. We typically continue if able to do so safely but will defer to surgeon to review and advise patient whether to hold since he has our permission if needed.  Will route this bundled recommendation to requesting provider via Epic fax function. Please call with questions.   Charlie Pitter, PA-C 10/12/2021, 11:40 AM

## 2021-10-12 NOTE — Progress Notes (Signed)
If MD has cleared the pt from testing, they will need to fax the clearance notes to requesting office. See clearance request in the chart. I will forward to pre op as well.

## 2021-10-12 NOTE — Telephone Encounter (Signed)
Pt advised his stress test results and that he is cleared for his upcoming surgery that is being planned... someone will reach out to him re: any med changes needed prior to surgery.   Pt verbalized understanding.

## 2021-10-27 ENCOUNTER — Ambulatory Visit: Payer: Self-pay | Admitting: Surgery

## 2021-11-13 NOTE — Patient Instructions (Signed)
SURGICAL WAITING ROOM VISITATION Patients having surgery or a procedure may have no more than 2 support people in the waiting area - these visitors may rotate in the visitor waiting room.   Children under the age of 25 must have an adult with them who is not the patient. If the patient needs to stay at the hospital during part of their recovery, the visitor guidelines for inpatient rooms apply.  PRE-OP VISITATION  Pre-op nurse will coordinate an appropriate time for 1 support person to accompany the patient in pre-op.  This support person may not rotate.  This visitor will be contacted when the time is appropriate for the visitor to come back in the pre-op area.  Please refer to the [facility] website for the visitor guidelines for Inpatients (after your surgery is over and you are in a regular room).  You are not required to quarantine at this time prior to your surgery. However, you must do this: Hand Hygiene often Do NOT share personal items Notify your provider if you are in close contact with someone who has COVID or you develop fever 100.4 or greater, new onset of sneezing, cough, sore throat, shortness of breath or body aches.  If you test positive for Covid or have been in contact with anyone that has tested positive in the last 10 days please notify you surgeon.    Your procedure is scheduled on:  Wednesday  November 23, 2021  Report to [facility] Main Entrance: [address] entrance where the [employer] is available.   Report to admitting at: 08:45 AM  +++++Call this number if you have any questions or problems the morning of surgery [phone]  Do not eat food after Midnight the night prior to your surgery/procedure.  After Midnight you may have the following liquids until  08:00 AM  Clear Liquid Diet Water Black Coffee (sugar ok, NO MILK/CREAM OR CREAMERS)  Tea (sugar ok, NO MILK/CREAM OR CREAMERS) regular and decaf                             Plain Jell-O   with no fruit (NO RED)                                           Fruit ices (not with fruit pulp, NO RED)                                     Popsicles (NO RED)                                                                  Juice: apple, WHITE grape, WHITE cranberry Sports drinks like Gatorade or Powerade (NO RED)             FOLLOW BOWEL PREP AND ANY ADDITIONAL PRE OP INSTRUCTIONS YOU RECEIVED FROM YOUR SURGEON'S OFFICE!!!   Oral Hygiene is also important to reduce your risk of infection.        Remember - BRUSH YOUR TEETH THE MORNING OF SURGERY  WITH YOUR REGULAR TOOTHPASTE  Do NOT smoke after Midnight the night before surgery.  [name]- Day before surgery: Take 50% dose (20 units) at bedtime                 Day of your surgery: do not take   Take ONLY these medicines the morning of surgery with A SIP OF WATER: Metoprolol                 You may not have any metal on your body including jewelry, and body piercing  Do not wear lotions, powders, cologne, or deodorant  Men may shave face and neck.  Contacts, Hearing Aids, dentures or bridgework may not be worn into surgery.   Patients discharged on the day of surgery will not be allowed to drive home.  Someone NEEDS to stay with you for the first 24 hours after anesthesia.  Do not bring your home medications to the hospital. The Pharmacy will dispense medications listed on your medication list to you during your admission in the Hospital.  Please read over the following fact sheets you were given: IF YOU HAVE QUESTIONS ABOUT YOUR PRE-OP INSTRUCTIONS, PLEASE CALL [phone]  ([place])   [facility] - Preparing for Surgery Before surgery, you can play an important role.  Because skin is not sterile, your skin needs to be as free of germs as possible.  You can reduce the number of germs on your skin by washing with CHG (chlorahexidine gluconate) soap before surgery.  CHG is an antiseptic cleaner which kills germs and bonds with the  skin to continue killing germs even after washing. Please DO NOT use if you have an allergy to CHG or antibacterial soaps.  If your skin becomes reddened/irritated stop using the CHG and inform your nurse when you arrive at Short Stay. Do not shave (including legs and underarms) for at least 48 hours prior to the first CHG shower.  You may shave your face/neck.  Please follow these instructions carefully:  1.  Shower with CHG Soap the night before surgery and the  morning of surgery.  2.  If you choose to wash your hair, wash your hair first as usual with your normal  shampoo.  3.  After you shampoo, rinse your hair and body thoroughly to remove the shampoo.                             4.  Use CHG as you would any other liquid soap.  You can apply chg directly to the skin and wash.  Gently with a scrungie or clean washcloth.  5.  Apply the CHG Soap to your body ONLY FROM THE NECK DOWN.   Do not use on face/ open                           Wound or open sores. Avoid contact with eyes, ears mouth and genitals (private parts).                       Wash face,  Genitals (private parts) with your normal soap.             6.  Wash thoroughly, paying special attention to the area where your  surgery  will be performed.  7.  Thoroughly rinse your body with warm water from the neck down.  8.  DO  NOT shower/wash with your normal soap after using and rinsing off the CHG Soap.            9.  Pat yourself dry with a clean towel.            10.  Wear clean pajamas.            11.  Place clean sheets on your bed the night of your first shower and do not  sleep with pets.  ON THE DAY OF SURGERY : Do not apply any lotions/deodorants the morning of surgery.  Please wear clean clothes to the hospital/surgery center.    FAILURE TO FOLLOW THESE INSTRUCTIONS MAY RESULT IN THE CANCELLATION OF YOUR SURGERY  PATIENT SIGNATURE_________________________________  NURSE  SIGNATURE__________________________________  ________________________________________________________________________

## 2021-11-13 NOTE — Progress Notes (Signed)
COVID Vaccine received:  '[x]'$  No '[]'$  Yes Date of any COVID positive Test in last 23 days:None  PCP - Arthur Holms, NP    Cardiologist - Dr. Dorris Carnes   Clearance by Melina Copa, PA-C  10-12-21  Chest x-ray - n/a EKG -  10-03-21  Epic Stress Test - 10-11-21 Epic ECHO - 01-04-2016  epic Cardiac Cath - done in [place] CABG - 2005 in [place]  Pacemaker/ICD device     '[x]'$  N/A Spinal Cord Stimulator:'[x]'$  No '[]'$  Yes      (Remind patient to bring remote DOS) Other Implants:   Bowel Prep - none  History of Sleep Apnea? '[x]'$  No '[]'$  Yes   Sleep Study Date:   CPAP used?- '[x]'$  No '[]'$  Yes  (Instruct to bring their mask & Tubing)  Does the patient monitor blood sugar? '[]'$  No '[x]'$  Yes  '[]'$  N/A Fasting Blood Sugar Ranges- 100-130 Checks Blood Sugar __1___ times a day  CBG today at PST visit was 307, patient has eaten a lot of sweets yesterday and today. I cautioned him about watching his diet closely prior to his surgery because if CBG is elevated this much, the surgery may have to be rescheduled.   Blood Thinner Instructions:  none Aspirin Instructions:  ASA  ok to hold per Melina Copa, PA Last Dose:  ERAS Protocol Ordered: '[x]'$  No  '[]'$  Yes   no drink ordered   Activity level: Patient can climb a flight of stairs without difficulty;  '[x]'$  No CP  '[x]'$  No SOB  Patient can perform ADLs without assistance.   Anesthesia review: CABG (2005 in [place]), CKD, DM/ HTN, Throat cancer (Radical neck dissection - Dr Lucia Gaskins 03-13-2012 Epic)  Patient states that he has had a TURP done after this neck dissection and there was no problems with intubation. Lyndon Code, PA was made aware.   Patient denies shortness of breath, fever, cough and chest pain at PAT appointment.  Patient verbalized understanding and agreement to the Pre-Surgical Instructions that were given to them at this PAT appointment. Patient was also educated of the need to review these PAT instructions again prior to his/her surgery.I reviewed  the appropriate phone numbers to call if they have any and questions or concerns.

## 2021-11-14 ENCOUNTER — Other Ambulatory Visit: Payer: Self-pay

## 2021-11-14 ENCOUNTER — Encounter (HOSPITAL_COMMUNITY): Payer: Self-pay

## 2021-11-14 ENCOUNTER — Encounter (HOSPITAL_COMMUNITY)
Admission: RE | Admit: 2021-11-14 | Discharge: 2021-11-14 | Disposition: A | Discharge status: Home / Self Care | Payer: Medicare PPO | Source: Ambulatory Visit | Attending: Surgery | Admitting: Surgery

## 2021-11-14 VITALS — BP 142/76 | HR 60 | Temp 97.8°F | Resp 16 | Ht 66.0 in | Wt 189.0 lb

## 2021-11-14 DIAGNOSIS — Z01818 Encounter for other preprocedural examination: Secondary | ICD-10-CM | POA: Diagnosis present

## 2021-11-14 DIAGNOSIS — E1122 Type 2 diabetes mellitus with diabetic chronic kidney disease: Secondary | ICD-10-CM | POA: Insufficient documentation

## 2021-11-14 DIAGNOSIS — I252 Old myocardial infarction: Secondary | ICD-10-CM | POA: Diagnosis not present

## 2021-11-14 DIAGNOSIS — E119 Type 2 diabetes mellitus without complications: Secondary | ICD-10-CM

## 2021-11-14 DIAGNOSIS — Z951 Presence of aortocoronary bypass graft: Secondary | ICD-10-CM | POA: Diagnosis not present

## 2021-11-14 DIAGNOSIS — I129 Hypertensive chronic kidney disease with stage 1 through stage 4 chronic kidney disease, or unspecified chronic kidney disease: Secondary | ICD-10-CM | POA: Diagnosis not present

## 2021-11-14 DIAGNOSIS — K4020 Bilateral inguinal hernia, without obstruction or gangrene, not specified as recurrent: Secondary | ICD-10-CM | POA: Diagnosis not present

## 2021-11-14 DIAGNOSIS — I2510 Atherosclerotic heart disease of native coronary artery without angina pectoris: Secondary | ICD-10-CM | POA: Diagnosis not present

## 2021-11-14 DIAGNOSIS — I10 Essential (primary) hypertension: Secondary | ICD-10-CM

## 2021-11-14 DIAGNOSIS — Z9221 Personal history of antineoplastic chemotherapy: Secondary | ICD-10-CM | POA: Insufficient documentation

## 2021-11-14 LAB — GLUCOSE, CAPILLARY: Glucose-Capillary: 307 mg/dL — ABNORMAL HIGH (ref 70–99)

## 2021-11-14 LAB — CBC
HCT: 42.1 % (ref 39.0–52.0)
Hemoglobin: 13.8 g/dL (ref 13.0–17.0)
MCH: 27.5 pg (ref 26.0–34.0)
MCHC: 32.8 g/dL (ref 30.0–36.0)
MCV: 83.9 fL (ref 80.0–100.0)
Platelets: 197 10*3/uL (ref 150–400)
RBC: 5.02 MIL/uL (ref 4.22–5.81)
RDW: 13.4 % (ref 11.5–15.5)
WBC: 4.9 10*3/uL (ref 4.0–10.5)
nRBC: 0 % (ref 0.0–0.2)

## 2021-11-14 LAB — BASIC METABOLIC PANEL
Anion gap: 8 (ref 5–15)
BUN: 15 mg/dL (ref 8–23)
CO2: 29 mmol/L (ref 22–32)
Calcium: 9.2 mg/dL (ref 8.9–10.3)
Chloride: 101 mmol/L (ref 98–111)
Creatinine, Ser: 1.45 mg/dL — ABNORMAL HIGH (ref 0.61–1.24)
GFR, Estimated: 51 mL/min — ABNORMAL LOW (ref >60)
Glucose, Bld: 285 mg/dL — ABNORMAL HIGH (ref 70–99)
Potassium: 4.4 mmol/L (ref 3.5–5.1)
Sodium: 138 mmol/L (ref 135–145)

## 2021-11-14 LAB — HEMOGLOBIN A1C
Hgb A1c MFr Bld: 9.4 % — ABNORMAL HIGH (ref 4.8–5.6)
Mean Plasma Glucose: 223.08 mg/dL

## 2021-11-15 NOTE — Progress Notes (Signed)
Anesthesia Chart Review   Case: [id number] Date/Time: 11/23/21 1045   Procedure: LAPAROSCOPIC BILATERAL INGUINAL HERNIA REPAIR WITH MESH (Bilateral)   Anesthesia type: General   Pre-op diagnosis: BILATERAL INGUINAL HERNIA   Location[facility] ROOM 04 / [facility]   Surgeons: Stechschulte, Nickola Major, MD       DISCUSSION:71 y.o. never smoker with h/o HTN, PONV, CAD (CABG 2004), DM II, CKD Stage III,   Limited ROM of neck, s/p radical dissection and radiation 2014 for throat cancer.   No difficulty with general anesthesia 01/30/2020.  Per cardiology preoperative evaluation 10/12/2021, "Chart reviewed as part of pre-operative protocol coverage. Per notes, per Dr. Harrington Challenger, "Leane Call shows no change from previous   NO ischemia  Old area of scar.   LVEF 53%. She says these are good results and you are cleared for surgery from a Cardiac Standpoint." Per discussion with Dr. Harrington Challenger in secure chat, OK to hold aspirin if needed for hernia repair. We typically continue if able to do so safely but will defer to surgeon to review and advise patient whether to hold since he has our permission if needed."  Evaluate CBG DOS.  Labs forwarded to Dr. Thermon Leyland.   Anticipate pt can proceed with planned procedure barring acute status change.   VS: BP (!) 142/76 Comment: right arm sitting  Pulse 60   Temp 36.6 C (Oral)   Resp 16   Ht 5' 6"  (1.676 m)   Wt 85.7 kg   SpO2 98%   BMI 30.51 kg/m   PROVIDERS: Arthur Holms, NP  Cardiologist - Dr. Dorris Carnes LABS: Labs reviewed: Acceptable for surgery. and forwarded to surgeon (all labs ordered are listed, but only abnormal results are displayed)  Labs Reviewed  HEMOGLOBIN A1C - Abnormal; Notable for the following components:      Result Value   Hgb A1c MFr Bld 9.4 (*)    All other components within normal limits  BASIC METABOLIC PANEL - Abnormal; Notable for the following components:   Glucose, Bld 285 (*)    Creatinine, Ser 1.45 (*)    GFR, Estimated 51 (*)     All other components within normal limits  GLUCOSE, CAPILLARY - Abnormal; Notable for the following components:   Glucose-Capillary 307 (*)    All other components within normal limits  CBC     IMAGES:   EKG:   CV: Myocardial perfusion 10/11/2021   Findings are consistent with prior myocardial infarction. The study is overall low risk.   No ST deviation was noted.   LV perfusion is abnormal. There is no evidence of ischemia. There is evidence of infarction. Defect 1: There is a medium defect with moderate reduction in uptake present in the apical anterior, mid anteroseptal, and apex location(s) that is fixed. There is abnormal wall motion in the defect area. Consistent with infarction.   Left ventricular function is grossly normal. Nuclear stress EF: 53 %. The left ventricular ejection fraction is mildly decreased (45-54%). End diastolic cavity size is normal. End systolic cavity size is normal.   Prior study available for comparison from 07/15/2015. No changes compared to prior study.  Echo 01/04/2016 Study Conclusions   - Left ventricle: The cavity size was normal. Wall thickness was    normal. Systolic function was mildly reduced. The estimated    ejection fraction was in the range of 45% to 50%. There is    akinesis of the mid-apicalanteroseptal myocardium. Doppler    parameters are consistent with abnormal left  ventricular    relaxation (grade 1 diastolic dysfunction).   Impressions:   - Akinesis of the mid and distal septum with overall mildly reduced    LV systolic function; grade 1 diastolic dysfunction; trace MR.  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 Harrington Challenger   remote hx MI;  s/p  PCI and stenting in 2003 x2 to LAD ;  s/p CABG x2 in 2004 (LIMA --LAD,  SVG--OM);  s/p  PCI stenting x2 to LAD/ OM;  cardiac cath 05-25-2010;  stress test 07-15-2015 no ischemia, ef 45-50%, intermediate risk   Decreased range of motion of neck    s/p  radical neck dissection for cancer in 2014   Depression    Diverticulosis    GERD (gastroesophageal reflux disease)    History of colon polyps    History of concussion 01/2018   per pt had vertigo fell and hit head, no loc, no residual   History of esophageal stricture 02/16/2011   s/p  dilatation   History of gout    toe   History of MI (myocardial infarction) 2003   s/p  PCI and stenting   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   Hypertension    followed by cardiology and pcp   Hypothyroidism, unspecified    followed by pcp---  per pt never been on medication   Limitation of opening of mouth    s/p  radical neck dissection due to cancer in 2014,  (01-28-2020  pt can get 2 finger's, but not 3)   Limited joint range of motion    limited mouth opening and neck ROM s/p radical neck dissection   Mixed hyperlipidemia    Myocardial infarction ([place])    PONV (postoperative nausea and vomiting)    Psoriasis    S/P CABG x 2 2004   S/P drug eluting coronary stent placement    2003  x2 to LAD;  2006 post cabg , des x2 to LAD/ OM   S/P radiation therapy    Received 8 fractions, then stopped due to mucositis with Radiation/ Cisplatin   Type 2 diabetes mellitus treated with insulin ([place])    followed by pcp----  (01-28-2020 per pt checks blood sugar daily in am,  fasting sugar--- 100-130)   Vertigo, intermittent     Past Surgical  History:  Procedure Laterality Date   CARDIAC CATHETERIZATION  05-25-2010  dr Mamie NickHarrington Challenger   LIMA-- LAD atretiz,  SVG--OM occluded, multiple areas which could be cause chest pain or ischemia (severe disease distal LCx or moderate disease Diagonal)   COLONOSCOPY WITH PROPOFOL  last one 05-30-2017  dr Carlean Purl   CORONARY ANGIOPLASTY WITH STENT PLACEMENT  2003   PCI and stent x2 to LAD   CORONARY ANGIOPLASTY WITH STENT PLACEMENT  10/ 2006  dr Lizbeth Bark   PCI and DES  x2  to LAD and OM   CORONARY ARTERY BYPASS GRAFT  2004  in [place]   LIMA --- LAD,  SVG -- OM   CYSTOSCOPY  02/09/2011   Procedure: CYSTOSCOPY;  Surgeon: Marissa Nestle, MD;  Location: [facility];  Service: Urology;  Laterality: N/A;   ESOPHAGOGASTRODUODENOSCOPY  02-16-2011  dr Carlean Purl   ESOPHAGOGASTRODUODENOSCOPY (EGD) WITH ESOPHAGEAL DILATION  02/16/2011   with Propofol   FOOT ARTHRODESIS, SUBTALAR Right 10/13/2009   MASS EXCISION N/A 03/13/2012   Procedure: EXCISION LEFT BUCCAL MUCOSA;  Surgeon: Rozetta Nunnery, MD;  Location: [facility];  Service: ENT;  Laterality: N/A;   RADICAL NECK DISSECTION Left 03/13/2012   Procedure: RADICAL NECK DISSECTION;  Surgeon: Rozetta Nunnery, MD;  Location: [facility];  Service: ENT;  Laterality: Left;   TOOTH EXTRACTION N/A 03/13/2012   Procedure: EXTRACTION MOLARS;  Surgeon: Rozetta Nunnery, MD;  Location: [facility];  Service: ENT;  Laterality: N/A;   TRANSURETHRAL RESECTION OF PROSTATE N/A 01/30/2020   Procedure: TRANSURETHRAL RESECTION OF THE PROSTATE (TURP);  Surgeon: Janith Lima, MD;  Location: [facility];  Service: Urology;  Laterality: N/A;   TRIGGER FINGER RELEASE Right 02/24/2016   Procedure: RELEASE TRIGGER FINGER/A-1 PULLEY RIGHT THUMB;  Surgeon: Daryll Brod, MD;  Location: [facility];  Service: Orthopedics;  Laterality: Right;  FAB    MEDICATIONS:  Accu-Chek Softclix Lancets lancets   acetaminophen (TYLENOL) 500 MG tablet   Alcohol Swabs (DROPSAFE ALCOHOL  PREP) 70 % PADS   amLODipine (NORVASC) 5 MG tablet   aspirin EC 81 MG tablet   atorvastatin (LIPITOR) 40 MG tablet   Blood Glucose Calibration (ACCU-CHEK GUIDE CONTROL) LIQD   Blood Glucose Monitoring Suppl (ACCU-CHEK GUIDE) w/Device KIT   Coenzyme Q10 (COQ-10 PO)   dapagliflozin propanediol (FARXIGA) 10 MG TABS tablet   enalapril (VASOTEC) 20 MG tablet   fluticasone (FLONASE) 50 MCG/ACT nasal spray   insulin degludec (TRESIBA FLEXTOUCH) 100 UNIT/ML FlexTouch Pen   Insulin Pen Needle 32G X 4 MM MISC   levocetirizine (XYZAL) 5 MG tablet   meclizine (ANTIVERT) 25 MG tablet   metoprolol succinate (TOPROL XL) 25 MG 24 hr tablet   Multiple Vitamins-Minerals (MULTIVITAMIN WITH MINERALS) tablet   naproxen sodium (ALEVE) 220 MG tablet   nitroGLYCERIN (NITROSTAT) 0.4 MG SL tablet   omeprazole (PRILOSEC) 40 MG capsule   ondansetron (ZOFRAN-ODT) 4 MG disintegrating tablet   pantoprazole (PROTONIX) 40 MG tablet   simvastatin (ZOCOR) 40 MG tablet   TART CHERRY PO   triamcinolone cream (KENALOG) 0.1 %   No current facility-administered medications for this encounter.     Konrad Felix Ward, PA-C [facility]-Surgical Testing [phone]

## 2021-11-15 NOTE — Anesthesia Preprocedure Evaluation (Addendum)
Anesthesia Evaluation  Patient identified by MRN, date of birth, ID band Patient awake    Reviewed: Allergy & Precautions, NPO status , Patient's Chart, lab work & pertinent test results  History of Anesthesia Complications (+) PONV and history of anesthetic complications  Airway Mallampati: II  TM Distance: >3 FB Neck ROM: Limited  Mouth opening: Limited Mouth Opening  Dental  (+) Teeth Intact, Dental Advisory Given   Pulmonary neg pulmonary ROS,    breath sounds clear to auscultation       Cardiovascular hypertension, Pt. on medications and Pt. on home beta blockers + CAD and + Past MI   Rhythm:Regular Rate:Normal     Neuro/Psych PSYCHIATRIC DISORDERS negative neurological ROS     GI/Hepatic GERD  ,Hx noted Dr. Nyoka Cowden   Endo/Other  diabetesHypothyroidism   Renal/GU Renal disease     Musculoskeletal  (+) Arthritis ,   Abdominal   Peds  Hematology   Anesthesia Other Findings   Reproductive/Obstetrics                           Anesthesia Physical Anesthesia Plan  ASA: 3  Anesthesia Plan: General   Post-op Pain Management: Tylenol PO (pre-op)*   Induction:   PONV Risk Score and Plan: 4 or greater and Ondansetron, Dexamethasone and Midazolam  Airway Management Planned: Oral ETT  Additional Equipment:   Intra-op Plan:   Post-operative Plan: Extubation in OR  Informed Consent: I have reviewed the patients History and Physical, chart, labs and discussed the procedure including the risks, benefits and alternatives for the proposed anesthesia with the patient or authorized representative who has indicated his/her understanding and acceptance.     Dental advisory given  Plan Discussed with: CRNA and Anesthesiologist  Anesthesia Plan Comments: (See PAT note 11/14/2021)       Anesthesia Quick Evaluation

## 2021-11-23 ENCOUNTER — Ambulatory Visit (HOSPITAL_COMMUNITY): Payer: Medicare PPO | Admitting: Physician Assistant

## 2021-11-23 ENCOUNTER — Other Ambulatory Visit: Payer: Self-pay

## 2021-11-23 ENCOUNTER — Encounter (HOSPITAL_COMMUNITY): Payer: Self-pay | Admitting: Surgery

## 2021-11-23 ENCOUNTER — Encounter (HOSPITAL_COMMUNITY)
Admission: RE | Disposition: A | Discharge status: Home / Self Care | Payer: Self-pay | Source: Ambulatory Visit | Attending: Surgery

## 2021-11-23 ENCOUNTER — Ambulatory Visit (HOSPITAL_COMMUNITY)
Admission: RE | Admit: 2021-11-23 | Discharge: 2021-11-23 | Disposition: A | Discharge status: Home / Self Care | Payer: Medicare PPO | Source: Ambulatory Visit | Attending: Surgery | Admitting: Surgery

## 2021-11-23 ENCOUNTER — Ambulatory Visit (HOSPITAL_BASED_OUTPATIENT_CLINIC_OR_DEPARTMENT_OTHER): Payer: Medicare PPO | Admitting: Anesthesiology

## 2021-11-23 DIAGNOSIS — I10 Essential (primary) hypertension: Secondary | ICD-10-CM | POA: Insufficient documentation

## 2021-11-23 DIAGNOSIS — I129 Hypertensive chronic kidney disease with stage 1 through stage 4 chronic kidney disease, or unspecified chronic kidney disease: Secondary | ICD-10-CM | POA: Diagnosis not present

## 2021-11-23 DIAGNOSIS — I2510 Atherosclerotic heart disease of native coronary artery without angina pectoris: Secondary | ICD-10-CM | POA: Insufficient documentation

## 2021-11-23 DIAGNOSIS — Z794 Long term (current) use of insulin: Secondary | ICD-10-CM | POA: Insufficient documentation

## 2021-11-23 DIAGNOSIS — Z7984 Long term (current) use of oral hypoglycemic drugs: Secondary | ICD-10-CM | POA: Insufficient documentation

## 2021-11-23 DIAGNOSIS — Z7982 Long term (current) use of aspirin: Secondary | ICD-10-CM | POA: Diagnosis not present

## 2021-11-23 DIAGNOSIS — K4020 Bilateral inguinal hernia, without obstruction or gangrene, not specified as recurrent: Secondary | ICD-10-CM | POA: Diagnosis present

## 2021-11-23 DIAGNOSIS — E039 Hypothyroidism, unspecified: Secondary | ICD-10-CM | POA: Diagnosis not present

## 2021-11-23 DIAGNOSIS — N183 Chronic kidney disease, stage 3 unspecified: Secondary | ICD-10-CM | POA: Diagnosis not present

## 2021-11-23 DIAGNOSIS — I252 Old myocardial infarction: Secondary | ICD-10-CM | POA: Insufficient documentation

## 2021-11-23 DIAGNOSIS — M1990 Unspecified osteoarthritis, unspecified site: Secondary | ICD-10-CM | POA: Insufficient documentation

## 2021-11-23 DIAGNOSIS — E782 Mixed hyperlipidemia: Secondary | ICD-10-CM | POA: Diagnosis not present

## 2021-11-23 DIAGNOSIS — K219 Gastro-esophageal reflux disease without esophagitis: Secondary | ICD-10-CM | POA: Diagnosis not present

## 2021-11-23 DIAGNOSIS — E1122 Type 2 diabetes mellitus with diabetic chronic kidney disease: Secondary | ICD-10-CM | POA: Diagnosis not present

## 2021-11-23 DIAGNOSIS — K4090 Unilateral inguinal hernia, without obstruction or gangrene, not specified as recurrent: Secondary | ICD-10-CM | POA: Diagnosis not present

## 2021-11-23 HISTORY — PX: INGUINAL HERNIA REPAIR: SHX194

## 2021-11-23 LAB — GLUCOSE, CAPILLARY
Glucose-Capillary: 114 mg/dL — ABNORMAL HIGH (ref 70–99)
Glucose-Capillary: 134 mg/dL — ABNORMAL HIGH (ref 70–99)

## 2021-11-23 SURGERY — REPAIR, HERNIA, INGUINAL, BILATERAL, LAPAROSCOPIC
Anesthesia: General | Site: Inguinal | Laterality: Bilateral

## 2021-11-23 MED ORDER — CHLORHEXIDINE GLUCONATE 0.12 % MT SOLN
15.0000 mL | Freq: Once | OROMUCOSAL | Status: AC
  Administered 2021-11-23: 15 mL via OROMUCOSAL

## 2021-11-23 MED ORDER — SUCCINYLCHOLINE CHLORIDE 200 MG/10ML IV SOSY
PREFILLED_SYRINGE | INTRAVENOUS | Status: AC
  Filled 2021-11-23: qty 10

## 2021-11-23 MED ORDER — LIDOCAINE 2% (20 MG/ML) 5 ML SYRINGE
INTRAMUSCULAR | Status: DC | PRN
  Administered 2021-11-23: 60 mg via INTRAVENOUS

## 2021-11-23 MED ORDER — CHLORHEXIDINE GLUCONATE CLOTH 2 % EX PADS: 6.0000 | MEDICATED_PAD | Freq: Once | CUTANEOUS | Status: DC

## 2021-11-23 MED ORDER — BUPIVACAINE-EPINEPHRINE (PF) 0.5% -1:200000 IJ SOLN
INTRAMUSCULAR | Status: DC | PRN
  Administered 2021-11-23: 30 mL

## 2021-11-23 MED ORDER — 0.9 % SODIUM CHLORIDE (POUR BTL) OPTIME
TOPICAL | Status: DC | PRN
  Administered 2021-11-23: 1000 mL

## 2021-11-23 MED ORDER — BUPIVACAINE-EPINEPHRINE (PF) 0.5% -1:200000 IJ SOLN
INTRAMUSCULAR | Status: AC
  Filled 2021-11-23: qty 30

## 2021-11-23 MED ORDER — ROCURONIUM BROMIDE 10 MG/ML (PF) SYRINGE
PREFILLED_SYRINGE | INTRAVENOUS | Status: DC | PRN
  Administered 2021-11-23: 50 mg via INTRAVENOUS

## 2021-11-23 MED ORDER — LACTATED RINGERS IV SOLN: INTRAVENOUS | Status: DC

## 2021-11-23 MED ORDER — BUPIVACAINE LIPOSOME 1.3 % IJ SUSP
INTRAMUSCULAR | Status: AC
  Filled 2021-11-23: qty 20

## 2021-11-23 MED ORDER — LIDOCAINE HCL (PF) 2 % IJ SOLN
INTRAMUSCULAR | Status: DC | PRN
  Administered 2021-11-23: 1.5 mg/kg/h via INTRADERMAL

## 2021-11-23 MED ORDER — FENTANYL CITRATE (PF) 250 MCG/5ML IJ SOLN
INTRAMUSCULAR | Status: DC | PRN
  Administered 2021-11-23: 50 ug via INTRAVENOUS
  Administered 2021-11-23: 100 ug via INTRAVENOUS
  Administered 2021-11-23: 50 ug via INTRAVENOUS

## 2021-11-23 MED ORDER — EPHEDRINE 5 MG/ML INJ
INTRAVENOUS | Status: AC
  Filled 2021-11-23: qty 5

## 2021-11-23 MED ORDER — DEXAMETHASONE SODIUM PHOSPHATE 10 MG/ML IJ SOLN
INTRAMUSCULAR | Status: DC | PRN
  Administered 2021-11-23: 8 mg via INTRAVENOUS

## 2021-11-23 MED ORDER — ONDANSETRON HCL 4 MG/2ML IJ SOLN
INTRAMUSCULAR | Status: DC | PRN
  Administered 2021-11-23: 4 mg via INTRAVENOUS

## 2021-11-23 MED ORDER — ORAL CARE MOUTH RINSE: 15.0000 mL | Freq: Once | OROMUCOSAL | Status: AC

## 2021-11-23 MED ORDER — SUGAMMADEX SODIUM 200 MG/2ML IV SOLN
INTRAVENOUS | Status: DC | PRN
  Administered 2021-11-23: 200 mg via INTRAVENOUS

## 2021-11-23 MED ORDER — DEXAMETHASONE SODIUM PHOSPHATE 10 MG/ML IJ SOLN
INTRAMUSCULAR | Status: AC
  Filled 2021-11-23: qty 1

## 2021-11-23 MED ORDER — HYDROMORPHONE HCL 1 MG/ML IJ SOLN: 0.2500 mg | INTRAMUSCULAR | Status: DC | PRN

## 2021-11-23 MED ORDER — LIDOCAINE HCL (PF) 2 % IJ SOLN
INTRAMUSCULAR | Status: AC
  Filled 2021-11-23: qty 5

## 2021-11-23 MED ORDER — PROPOFOL 10 MG/ML IV BOLUS
INTRAVENOUS | Status: DC | PRN
  Administered 2021-11-23: 150 mg via INTRAVENOUS

## 2021-11-23 MED ORDER — EPHEDRINE SULFATE-NACL 50-0.9 MG/10ML-% IV SOSY
PREFILLED_SYRINGE | INTRAVENOUS | Status: DC | PRN
  Administered 2021-11-23: 10 mg via INTRAVENOUS

## 2021-11-23 MED ORDER — GLYCOPYRROLATE 0.2 MG/ML IJ SOLN
INTRAMUSCULAR | Status: DC | PRN
  Administered 2021-11-23: .1 mg via INTRAVENOUS

## 2021-11-23 MED ORDER — LIDOCAINE HCL 2 % IJ SOLN
INTRAMUSCULAR | Status: AC
  Filled 2021-11-23: qty 20

## 2021-11-23 MED ORDER — BUPIVACAINE LIPOSOME 1.3 % IJ SUSP
INTRAMUSCULAR | Status: DC | PRN
  Administered 2021-11-23: 20 mL

## 2021-11-23 MED ORDER — SUCCINYLCHOLINE CHLORIDE 200 MG/10ML IV SOSY
PREFILLED_SYRINGE | INTRAVENOUS | Status: DC | PRN
  Administered 2021-11-23: 140 mg via INTRAVENOUS

## 2021-11-23 MED ORDER — FENTANYL CITRATE (PF) 250 MCG/5ML IJ SOLN
INTRAMUSCULAR | Status: AC
  Filled 2021-11-23: qty 5

## 2021-11-23 MED ORDER — BUPIVACAINE LIPOSOME 1.3 % IJ SUSP: 20.0000 mL | Freq: Once | INTRAMUSCULAR | Status: DC

## 2021-11-23 MED ORDER — ACETAMINOPHEN 500 MG PO TABS
1000.0000 mg | ORAL_TABLET | ORAL | Status: AC
  Administered 2021-11-23: 1000 mg via ORAL
  Filled 2021-11-23: qty 2

## 2021-11-23 MED ORDER — ONDANSETRON HCL 4 MG/2ML IJ SOLN
INTRAMUSCULAR | Status: AC
  Filled 2021-11-23: qty 2

## 2021-11-23 MED ORDER — GABAPENTIN 300 MG PO CAPS
300.0000 mg | ORAL_CAPSULE | ORAL | Status: AC
  Administered 2021-11-23: 300 mg via ORAL
  Filled 2021-11-23: qty 1

## 2021-11-23 MED ORDER — PROPOFOL 10 MG/ML IV BOLUS
INTRAVENOUS | Status: AC
  Filled 2021-11-23: qty 20

## 2021-11-23 MED ORDER — GLYCOPYRROLATE 0.2 MG/ML IJ SOLN
INTRAMUSCULAR | Status: AC
  Filled 2021-11-23: qty 1

## 2021-11-23 MED ORDER — OXYCODONE-ACETAMINOPHEN 5-325 MG PO TABS: 1.0000 | ORAL_TABLET | ORAL | 0 refills | Status: AC | PRN

## 2021-11-23 MED ORDER — CEFAZOLIN SODIUM-DEXTROSE 2-4 GM/100ML-% IV SOLN
2.0000 g | INTRAVENOUS | Status: AC
  Administered 2021-11-23: 2 g via INTRAVENOUS
  Filled 2021-11-23: qty 100

## 2021-11-23 MED ORDER — ROCURONIUM BROMIDE 10 MG/ML (PF) SYRINGE
PREFILLED_SYRINGE | INTRAVENOUS | Status: AC
  Filled 2021-11-23: qty 10

## 2021-11-23 SURGICAL SUPPLY — 32 items
BAG COUNTER SPONGE SURGICOUNT (BAG) ×1 IMPLANT
CABLE HIGH FREQUENCY MONO STRZ (ELECTRODE) ×1 IMPLANT
CHLORAPREP W/TINT 26 (MISCELLANEOUS) ×1 IMPLANT
DERMABOND ADVANCED .7 DNX12 (GAUZE/BANDAGES/DRESSINGS) ×1 IMPLANT
ELECT REM PT RETURN 15FT ADLT (MISCELLANEOUS) ×1 IMPLANT
GLOVE BIO SURGEON STRL SZ7.5 (GLOVE) ×1 IMPLANT
GLOVE INDICATOR 8.0 STRL GRN (GLOVE) ×1 IMPLANT
GOWN STRL REUS W/ TWL XL LVL3 (GOWN DISPOSABLE) ×2 IMPLANT
GOWN STRL REUS W/TWL XL LVL3 (GOWN DISPOSABLE) ×2
GRASPER SUT TROCAR 14GX15 (MISCELLANEOUS) ×1 IMPLANT
IRRIG SUCT STRYKERFLOW 2 WTIP (MISCELLANEOUS)
IRRIGATION SUCT STRKRFLW 2 WTP (MISCELLANEOUS) IMPLANT
KIT BASIN OR (CUSTOM PROCEDURE TRAY) ×1 IMPLANT
KIT TURNOVER KIT A (KITS) IMPLANT
MARKER SKIN DUAL TIP RULER LAB (MISCELLANEOUS) ×1 IMPLANT
MESH 3DMAX 5X7 LT XLRG (Mesh General) IMPLANT
MESH 3DMAX 5X7 RT XLRG (Mesh General) IMPLANT
NDL INSUFFLATION 14GA 120MM (NEEDLE) ×2 IMPLANT
NEEDLE INSUFFLATION 14GA 120MM (NEEDLE) ×1 IMPLANT
RELOAD STAPLE 4.0 BLU F/HERNIA (INSTRUMENTS) IMPLANT
RELOAD STAPLE 4.8 BLK F/HERNIA (STAPLE) ×1 IMPLANT
RELOAD STAPLE HERNIA 4.0 BLUE (INSTRUMENTS) IMPLANT
RELOAD STAPLE HERNIA 4.8 BLK (STAPLE) ×2 IMPLANT
SCISSORS LAP 5X35 DISP (ENDOMECHANICALS) ×1 IMPLANT
SET TUBE SMOKE EVAC HIGH FLOW (TUBING) ×1 IMPLANT
STAPLER HERNIA 12 8.5 360D (INSTRUMENTS) ×1 IMPLANT
SUT MNCRL AB 4-0 PS2 18 (SUTURE) ×1 IMPLANT
SUT VICRYL 0 UR6 27IN ABS (SUTURE) IMPLANT
TOWEL OR 17X26 10 PK STRL BLUE (TOWEL DISPOSABLE) ×1 IMPLANT
TRAY LAPAROSCOPIC (CUSTOM PROCEDURE TRAY) ×1 IMPLANT
TROCAR ADV FIXATION 12X100MM (TROCAR) ×1 IMPLANT
TROCAR Z-THREAD OPTICAL 5X100M (TROCAR) ×2 IMPLANT

## 2021-11-23 NOTE — H&P (Signed)
Admitting Physician: Nickola Major Adreonna Yontz  Service: General surgery  CC: Hernia  Subjective   HPI: Gregory Friedman is an 71 y.o. 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 Har[facility] Challenger   remote hx MI;  s/p  PCI and stenting in 2003 x2 to LAD ;  s/p CABG x2 in 2004 (LIMA --LAD,  SVG--OM);  s/p  PCI stenting x2 to LAD/ OM;  cardiac cath 05-25-2010;  stress test 07-15-2015 no ischemia, ef 45-50%, intermediate risk   Decreased range of motion of neck    s/p  radical neck dissection for cancer in 2014   Depression    Diverticulosis    GERD (gastroesophageal reflux disease)    History of colon polyps    History of concussion 01/2018   per pt had vertigo fell and hit head, no loc, no residual   History of esophageal stricture 02/16/2011   s/p  dilatation   History of gout    toe   History of MI (myocardial infarction) 2003   s/p  PCI and stenting   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   Hypertension    followed by cardiology and pcp   Hypothyroidism, unspecified    followed by pcp---  per pt never been on medication   Limitation of opening of mouth    s/p  radical neck dissection due to cancer in 2014,  (01-28-2020  pt can get 2 finger's, but not 3)   Limited joint range of motion    limited mouth opening and neck ROM s/p radical neck dissection   Mixed  hyperlipidemia    Myocardial infarction ([place])    PONV (postoperative nausea and vomiting)    Psoriasis    S/P CABG x 2 2004   S/P drug eluting coronary stent placement    2003  x2 to LAD;  2006 post cabg , des x2 to LAD/ OM   S/P radiation therapy    Received 8 fractions, then stopped due to mucositis with Radiation/ Cisplatin   Type 2 diabetes mellitus treated with insulin ([place])    followed by pcp----  (01-28-2020 per pt checks blood sugar daily in am,  fasting sugar--- 100-130)   Vertigo, intermittent     Past Surgical History:  Procedure Laterality Date   CARDIAC CATHETERIZATION  05-25-2010  dr Mamie NickHarrington Challenger   LIMA-- LAD atretiz,  SVG--OM occluded, multiple areas which could be cause chest pain or ischemia (severe disease distal LCx or moderate disease Diagonal)   COLONOSCOPY WITH PROPOFOL  last one 05-30-2017  dr Carlean Purl   CORONARY ANGIOPLASTY WITH STENT PLACEMENT  2003   PCI and stent x2 to LAD   CORONARY ANGIOPLASTY WITH STENT PLACEMENT  10/ 2006  dr Lizbeth Bark   PCI and DES x2  to LAD and OM  CORONARY ARTERY BYPASS GRAFT  2004  in [place]   LIMA --- LAD,  SVG -- OM   CYSTOSCOPY  02/09/2011   Procedure: CYSTOSCOPY;  Surgeon: Marissa Nestle, MD;  Location: [facility];  Service: Urology;  Laterality: N/A;   ESOPHAGOGASTRODUODENOSCOPY  02-16-2011  dr Carlean Purl   ESOPHAGOGASTRODUODENOSCOPY (EGD) WITH ESOPHAGEAL DILATION  02/16/2011   with Propofol   FOOT ARTHRODESIS, SUBTALAR Right 10/13/2009   MASS EXCISION N/A 03/13/2012   Procedure: EXCISION LEFT BUCCAL MUCOSA;  Surgeon: Rozetta Nunnery, MD;  Location: [facility];  Service: ENT;  Laterality: N/A;   RADICAL NECK DISSECTION Left 03/13/2012   Procedure: RADICAL NECK DISSECTION;  Surgeon: Rozetta Nunnery, MD;  Location: [facility];  Service: ENT;  Laterality: Left;   TOOTH EXTRACTION N/A 03/13/2012   Procedure: EXTRACTION MOLARS;  Surgeon: Rozetta Nunnery, MD;  Location: [facility];  Service: ENT;  Laterality: N/A;   TRANSURETHRAL  RESECTION OF PROSTATE N/A 01/30/2020   Procedure: TRANSURETHRAL RESECTION OF THE PROSTATE (TURP);  Surgeon: Janith Lima, MD;  Location: [facility];  Service: Urology;  Laterality: N/A;   TRIGGER FINGER RELEASE Right 02/24/2016   Procedure: RELEASE TRIGGER FINGER/A-1 PULLEY RIGHT THUMB;  Surgeon: Daryll Brod, MD;  Location: [facility];  Service: Orthopedics;  Laterality: Right;  FAB    Family History  Problem Relation Age of Onset   Stroke Sister    Diabetes Brother    Seizures Mother    Asthma Father    Thyroid cancer Daughter    Diabetes Sister    Parkinson's disease Brother    Heart disease Brother    Diabetes Brother    Colon cancer Neg Hx     Social:  reports that he has never smoked. He has never used smokeless tobacco. He reports that he does not drink alcohol and does not use drugs.  Allergies:  Allergies  Allergen Reactions   Trulicity [Dulaglutide] Nausea Only    Anorexia, shaking, constipation, sweating     Medications: Current Outpatient Medications  Medication Instructions   Accu-Chek Softclix Lancets lancets USE AS DIRECTED   acetaminophen (TYLENOL) 1,000 mg, Oral, Daily PRN   Alcohol Swabs (DROPSAFE ALCOHOL PREP) 70 % PADS USE AS DIRECTED WHEN CHECKING BLOOD SUGAR   amLODipine (NORVASC) 5 mg, Daily   aspirin EC 81 mg, Oral, Daily at bedtime   atorvastatin (LIPITOR) 40 mg, Oral, Daily   Blood Glucose Calibration (ACCU-CHEK GUIDE CONTROL) LIQD Use as directed E11.65   Blood Glucose Monitoring Suppl (ACCU-CHEK GUIDE) w/Device KIT Use to check blood sugars daily E11.65   Coenzyme Q10 (COQ-10 PO) 1 capsule, Oral, Daily, COQ 10 + Tumeric    dapagliflozin propanediol (FARXIGA) 10 MG TABS tablet Take two tablets by mouth (20 mg total) at bedtime   enalapril (VASOTEC) 20 mg, Oral, Daily at bedtime   fluticasone (FLONASE) 50 MCG/ACT nasal spray 2 sprays, Each Nare, Daily   Insulin Pen Needle 32G X 4 MM MISC 1 Device, Does not apply, 4 times  daily   levocetirizine (XYZAL) 5 mg, Oral, Daily   meclizine (ANTIVERT) 25 mg, Oral, 3 times daily PRN   metoprolol succinate (TOPROL XL) 25 mg, Oral, Daily   Multiple Vitamins-Minerals (MULTIVITAMIN WITH MINERALS) tablet 2 tablets, Oral, Daily   naproxen sodium (ALEVE) 440 mg, Oral, Daily PRN   nitroGLYCERIN (NITROSTAT) 0.4 mg, Sublingual, Every 5 min PRN, Chest pain   omeprazole (PRILOSEC) 40 MG capsule TAKE 1 CAPSULE EVERY DAY BEFORE A MEAL  ondansetron (ZOFRAN-ODT) 4 mg, Oral, Every 8 hours PRN   pantoprazole (PROTONIX) 40 mg, Oral, Daily   simvastatin (ZOCOR) 40 mg, Oral, Every evening   TART CHERRY PO 1 tablet, Oral, Daily   Tresiba FlexTouch 40 Units, Subcutaneous, Daily at bedtime   triamcinolone cream (KENALOG) 0.1 % 1 application , Topical, 3 times daily, Apply to rash bid x 7-10 days, then prn when rash return    ROS - all of the below systems have been reviewed with the patient and positives are indicated with bold text General: chills, fever or night sweats Eyes: blurry vision or double vision ENT: epistaxis or sore throat Allergy/Immunology: itchy/watery eyes or nasal congestion Hematologic/Lymphatic: bleeding problems, blood clots or swollen lymph nodes Endocrine: temperature intolerance or unexpected weight changes Breast: new or changing breast lumps or nipple discharge Resp: cough, shortness of breath, or wheezing CV: chest pain or dyspnea on exertion GI: as per HPI GU: dysuria, trouble voiding, or hematuria MSK: joint pain or joint stiffness Neuro: TIA or stroke symptoms Derm: pruritus and skin lesion changes Psych: anxiety and depression  Objective   PE Blood pressure (!) 157/70, pulse (!) 55, temperature 97.8 F (36.6 C), temperature source Oral, resp. rate 16, height _0  (1.676 m), weight 85.7 kg, SpO2 99 %. Constitutional: NAD; conversant; no deformities Eyes: Moist conjunctiva; no lid lag; anicteric; PERRL Neck: Trachea midline; no  thyromegaly Lungs: Normal respiratory effort; no tactile fremitus CV: RRR; no palpable thrills; no pitting edema GI: Abd Bilateral inguinal hernias; no palpable hepatosplenomegaly MSK: Normal range of motion of extremities; no clubbing/cyanosis Psychiatric: Appropriate affect; alert and oriented x3 Lymphatic: No palpable cervical or axillary lymphadenopathy  Results for orders placed or performed during the hospital encounter of 11/23/21 (from the past 24 hour(s))  Glucose, capillary     Status: Abnormal   Collection Time: 11/23/21  8:54 AM  Result Value Ref Range   Glucose-Capillary 114 (H) 70 - 99 mg/dL   Comment 1 Notify RN    Comment 2 Document in Chart     Imaging Orders  No imaging studies ordered today   CT Abd/pel  1. Bilateral trace to small volume fat containing inguinal hernias, right greater than left. No findings to suggest ischemia or associated bowel obstruction. 2. Cholelithiasis no CT finding of acute cholecystitis. 3. Mild hepatic steatosis. 4. Aortic Atherosclerosis (ICD10-I70.0).  Assessment and Plan   Mr. Dexter Signor presented with non-recurrent bilateral inguinal hernia without obstruction or gangrene.  I recommended laparoscopic bilateral inguinal hernia repair with mesh. The procedure itself as well as its risk, benefits, and alternatives were discussed with the patient in full. After full discussion all questions answered the patient granted consent to proceed.       ICD-10-CM   1. Type 2 diabetes mellitus treated with insulin (HCC)  E11.9 CBG per Guidelines for Diabetes Management for Patients Undergoing Surgery ([facility], [facility], and [facility] only)   Z79.4 CBG per Guidelines for Diabetes Management for Patients Undergoing Surgery ([facility], [facility], and [facility] only)       Felicie Morn, MD  [facility] Use [email or web address] to contact on call provider

## 2021-11-23 NOTE — Anesthesia Postprocedure Evaluation (Signed)
Anesthesia Post Note  Patient: Gregory Friedman  Procedure(s) Performed: LAPAROSCOPIC BILATERAL INGUINAL HERNIA REPAIR WITH MESH (Bilateral: Inguinal)     Patient location during evaluation: PACU Anesthesia Type: General Level of consciousness: awake Pain management: pain level controlled Vital Signs Assessment: post-procedure vital signs reviewed and stable Respiratory status: spontaneous breathing Cardiovascular status: stable Postop Assessment: no apparent nausea or vomiting Anesthetic complications: no   No notable events documented.  Last Vitals:  Vitals:   11/23/21 1251 11/23/21 1300  BP: (!) 166/81 (!) 164/78  Pulse: (!) 59 63  Resp: 11 16  Temp: 36.4 C   SpO2: 100% 100%    Last Pain:  Vitals:   11/23/21 1251  TempSrc:   PainSc: 0-No pain                 Christpoher Sievers

## 2021-11-23 NOTE — Op Note (Signed)
Patient: Gregory Friedman ([birth year], [id number])  Date of Surgery: 11/23/2021   Preoperative Diagnosis: BILATERAL INGUINAL HERNIA   Postoperative Diagnosis: BILATERAL INGUINAL HERNIA   Surgical Procedure: LAPAROSCOPIC BILATERAL INGUINAL HERNIA REPAIR WITH MESH:    Operative Team Members:  Surgeon(s) and Role:    * Shawnique Mariotti, Nickola Major, MD - Primary   Anesthesiologist: Belinda Block, MD CRNA: Claudia Desanctis, CRNA; Luciana Axe K, CRNA   Anesthesia: General   Fluids:  No intake/output data recorded.  Complications: None  Drains:  None  Specimen: None  Disposition:  PACU - hemodynamically stable.  Plan of Care: Discharge to home after PACU  Indications for Procedure: Keiland Pickering is a 71 y.o. male who presented with bilateral inguinal hernias.  I recommended laparoscopic repair.  We discussed the risks, benefits and alteratives and the patient granted consent to proceed.  Findings:  Technique: Transabdominal preperitoneal (TAPP) Hernia Location: Bilateral indirect inguinal hernia Mesh Size &Type:  Bard 3D Max Extra Large Right and Left sided meshes (2 meshes) Mesh Fixation: Endo-Universal hernia stapler  Infection status: Patient: Private Patient Elective Case Case: Elective Infection Present At Time Of Surgery (PATOS): None   Description of Procedure:  The patient was positioned supine, padded and secured to the bed, with both arms tucked.  The abdomen was widely prepped and draped.  A time out procedure was performed.  A 1 cm infraumbilical incision was made.  The abdomen was entered without trauma to the underlying viscera.  The abdomen was insufflated to 15 mm of Hg.  A 12 mm trocar was inserted at the periumbilical incision.  Additional 5 mm trocars were placed in the left and right abdomen.  There was no trauma to the underlying viscera.  There was an indirect hernia on the RIGHT.  Utilizing a transabdominal pre peritoneal technique (TAPP), a  horizontal incision was made in the peritoneum, immediately below the umbilicus.  Dissection was carried out in the pre peritoneal space down to the level of the hernia sac which was reduced into the peritoneal cavity completely.  The cord contents were parietalized and preserved.  A large pre peritoneal dissection was performed to uncover the direct, indirect, femoral and obturator spaces.  Cooper's ligament was uncovered medially and the psoas muscle uncovered laterally.  The mesh, as documented above, was opened and advanced into the pre peritoneal position so that it more than adequately covered the indirect, direct, femoral and obturator spaces.  The mesh laid flat, with no inferior folds and covered the entire myopectineal orifice.  The mesh was fixated with the endo-universal hernia stapler to Cooper's ligament and the posterior aspect of the rectus muscle.  The peritoneal flap was closed with the same device.  There were no peritoneal defects or exposed mesh at the conclusion.  There was an indirect hernia on the LEFT.  Utilizing a transabdominal pre peritoneal technique (TAPP), a horizontal incision was made in the peritoneum, immediately below the umbilicus.  Dissection was carried out in the pre peritoneal space down to the level of the hernia sac which was reduced into the peritoneal cavity completely.  The cord contents were parietalized and preserved.  A large pre peritoneal dissection was performed to uncover the direct, indirect, femoral and obturator spaces.  Cooper's ligament was uncovered medially and the psoas muscle uncovered laterally.  The mesh, as documented above, was opened and advanced into the pre peritoneal position so that it more than adequately covered the indirect, direct, femoral and obturator spaces.  The mesh  laid flat, with no inferior folds and covered the entire myopectineal orifice.  The mesh was fixated with the endo-universal hernia stapler to Cooper's ligament and the  posterior aspect of the rectus muscle.  The peritoneal flap was closed with the same device.  There were no peritoneal defects or exposed mesh at the conclusion.  The umbilical trocar was removed and the fascial defect was closed with a 0 Vicryl suture.  The peritoneal cavity was completely desufflated, the trocars removed and the skin closed with 4-0 Monocryl subcuticular suture and skin glue.  All sponge and needle counts were correct at the end of the case.  Louanna Raw, MD General, Bariatric, & Minimally Invasive Surgery [facility], [place]

## 2021-11-23 NOTE — Discharge Instructions (Signed)
 GROIN HERNIA REPAIR POST OPERATIVE INSTRUCTIONS  Thinking Clearly  The anesthesia may cause you to feel different for 1 or 2 days. Do not drive, drink alcohol, or make any big decisions for at least 2 days.  Nutrition When you wake up, you will be able to drink small amounts of liquid. If you do not feel sick, you can slowly advance your diet to regular foods. Continue to drink lots of fluids, usually about 8 to 10 glasses per day. Eat a high-fiber diet so you don't strain during bowel movements. High-Fiber Foods Foods high in fiber include beans, bran cereals and whole-grain breads, peas, dried fruit (figs, apricots, and dates), raspberries, blackberries, strawberries, sweet corn, broccoli, baked potatoes with skin, plums, pears, apples, greens, and nuts. Activity Slowly increase your activity. Be sure to get up and walk every hour or so to prevent blood clots. No heavy lifting or strenuous activity for 4 weeks following surgery to prevent hernias at your incision sites or recurrence of your hernia. It is normal to feel tired. You may need more sleep than usual.  Get your rest but make sure to get up and move around frequently to prevent blood clots and pneumonia.  Work and Return to School You can go back to work when you feel well enough. Discuss the timing with your surgeon. You can usually go back to school or work 1 week or less after an laparoscopic or an open repair. If your work requires heavy lifting or strenuous activity you need to be placed on light duty for 4 weeks following surgery. You can return to gym class, sports or other physical activities 4 weeks after surgery.  Wound Care You may experience significant bruising in the groin including into the scrotum in males.  Rest, elevating the groin and scrotum above the level of the heart, ice and compression with tight fitting underwear can help.  Always wash your hands before and after touching near your incision site. Do  not soak in a bathtub until cleared at your follow up appointment. You may take a shower 24 hours after surgery. A small amount of drainage from the incision is normal. If the drainage is thick and yellow or the site is red, you may have an infection, so call your surgeon. If you have a drain in one of your incisions, it will be taken out in office when the drainage stops. Steri-Strips will fall off in 7 to 10 days or they will be removed during your first office visit. If you have dermabond glue covering over the incision, allow the glue to flake off on its own. Protect the new skin, especially from the sun. The sun can burn and cause darker scarring. Your scar will heal in about 4 to 6 weeks and will become softer and continue to fade over the next year.  The cosmetic appearance of the incisions will improve over the course of the first year after surgery. Sensation around your incision will return in a few weeks or months.  Bowel Movements After intestinal surgery, you may have loose watery stools for several days. If watery diarrhea lasts longer than 3 days, contact your surgeon. Pain medication (narcotics) can cause constipation. Increase the fiber in your diet with high-fiber foods if you are constipated. You can take an over the counter stool softener like Colace to avoid constipation.  Additional over the counter medications can also be used if Colace isn't sufficient (for example, Milk of Magnesia or Miralax).    Pain The amount of pain is different for each person. Some people need only 1 to 3 doses of pain control medication, while others need more. Take alternating doses of tylenol and ibuprofen around the clock for the first five days following surgery.  This will provide a baseline of pain control and help with inflammation.  Take the narcotic pain medication in addition if needed for severe pain.  Contact Your Surgeon at [phone], if you have: Pain that will not go away Pain that  gets worse A fever of more than 101F (38.3C) Repeated vomiting Swelling, redness, bleeding, or bad-smelling drainage from your wound site Strong abdominal pain No bowel movement or unable to pass gas for 3 days Watery diarrhea lasting longer than 3 days  Pain Control The goal of pain control is to minimize pain, keep you moving and help you heal. Your surgical team will work with you on your pain plan. Most often a combination of therapies and medications are used to control your pain. You may also be given medication (local anesthetic) at the surgical site. This may help control your pain for several days. Extreme pain puts extra stress on your body at a time when your body needs to focus on healing. Do not wait until your pain has reached a level "10" or is unbearable before telling your doctor or nurse. It is much easier to control pain before it becomes severe. Following a laparoscopic procedure, pain is sometimes felt in the shoulder. This is due to the gas inserted into your abdomen during the procedure. Moving and walking helps to decrease the gas and the right shoulder pain.  Use the guide below for ways to manage your post-operative pain. Learn more by going to facs.org/safepaincontrol.  How Intense Is My Pain Common Therapies to Feel Better       I hardly notice my pain, and it does not interfere with my activities.  I notice my pain and it distracts me, but I can still do activities (sitting up, walking, standing).  Non-Medication Therapies  Ice (in a bag, applied over clothing at the surgical site), elevation, rest, meditation, massage, distraction (music, TV, play) walking and mild exercise Splinting the abdomen with pillows +  Non-Opioid Medications Acetaminophen (Tylenol) Non-steroidal anti-inflammatory drugs (NSAIDS) Aspirin, Ibuprofen (Motrin, Advil) Naproxen (Aleve) Take these as needed, when you feel pain. Both acetaminophen and NSAIDs help to decrease pain  and swelling (inflammation).      My pain is hard to ignore and is more noticeable even when I rest.  My pain interferes with my usual activities.  Non-Medication Therapies  +  Non-Opioid medications  Take on a regular schedule (around-the-clock) instead of as needed. (For example, Tylenol every 6 hours at 9:00 am, 3:00 pm, 9:00 pm, 3:00 am and Motrin every 6 hours at 12:00 am, 6:00 am, 12:00 pm, 6:00 pm)         I am focused on my pain, and I am not doing my daily activities.  I am groaning in pain, and I cannot sleep. I am unable to do anything.  My pain is as bad as it could be, and nothing else matters.  Non-Medication Therapies  +  Around-the-Clock Non-Opioid Medications  +  Short-acting opioids  Opioids should be used with other medications to manage severe pain. Opioids block pain and give a feeling of euphoria (feel high). Addiction, a serious side effect of opioids, is rare with short-term (a few days) use.  Examples of short-acting opioids   include: Tramadol (Ultram), Hydrocodone (Norco, Vicodin), Hydromorphone (Dilaudid), Oxycodone (Oxycontin)     The above directions have been adapted from the American College of Surgeons Surgical Patient Education Program.  Please refer to the ACS website if needed: [email or web address]   Gregory Kubitz, MD [facility] [address], [address], [place], [place]  [address] ?  [address] [address], [place], [place]   [address] [phone] ? [phone] ? FAX [phone] Web site: [email or web address]  

## 2021-11-23 NOTE — Anesthesia Procedure Notes (Signed)
Procedure Name: Intubation Date/Time: 11/23/2021 11:06 AM  Performed by: Jenne Campus, CRNAPre-anesthesia Checklist: Patient identified, Emergency Drugs available, Suction available and Patient being monitored Patient Re-evaluated:Patient Re-evaluated prior to induction Oxygen Delivery Method: Circle System Utilized Preoxygenation: Pre-oxygenation with 100% oxygen Induction Type: IV induction Ventilation: Mask ventilation without difficulty Laryngoscope Size: Glidescope and 3 Grade View: Grade I Tube type: Oral Tube size: 7.5 mm Number of attempts: 1 Airway Equipment and Method: Stylet and Oral airway Placement Confirmation: ETT inserted through vocal cords under direct vision, positive ETCO2 and breath sounds checked- equal and bilateral Secured at: 22 cm Tube secured with: Tape Dental Injury: Teeth and Oropharynx as per pre-operative assessment  Comments: Elective video-glide. Patient with history of radical neck dissection and radiation for cancer. Limited neck mobility and mouth opening.

## 2021-11-23 NOTE — Transfer of Care (Signed)
Immediate Anesthesia Transfer of Care Note  Patient: Gregory Friedman  Procedure(s) Performed: LAPAROSCOPIC BILATERAL INGUINAL HERNIA REPAIR WITH MESH (Bilateral: Inguinal)  Patient Location: PACU  Anesthesia Type:General  Level of Consciousness: oriented, sedated and patient cooperative  Airway & Oxygen Therapy: Patient Spontanous Breathing and Patient connected to face mask oxygen  Post-op Assessment: Report given to RN and Post -op Vital signs reviewed and stable  Post vital signs: Reviewed  Last Vitals:  Vitals Value Taken Time  BP 166/81 11/23/21 1251  Temp    Pulse 60 11/23/21 1255  Resp 13 11/23/21 1255  SpO2 100 % 11/23/21 1255  Vitals shown include unvalidated device data.  Last Pain:  Vitals:   11/23/21 0902  TempSrc:   PainSc: 5       Patients Stated Pain Goal: 4 (75/[id number])  Complications: No notable events documented.

## 2021-11-24 ENCOUNTER — Encounter (HOSPITAL_COMMUNITY): Payer: Self-pay | Admitting: Surgery

## 2021-11-29 ENCOUNTER — Encounter (HOSPITAL_COMMUNITY): Payer: Self-pay | Admitting: Surgery

## 2022-01-12 ENCOUNTER — Other Ambulatory Visit: Payer: Self-pay | Admitting: Physician Assistant

## 2023-03-20 ENCOUNTER — Telehealth: Payer: Self-pay | Admitting: Radiation Oncology

## 2023-03-20 NOTE — Telephone Encounter (Signed)
 Received Medical record request from [facility] for DICOM records. Request forwarded to dosimetry.
